# Patient Record
Sex: Male | Born: 1955
Health system: Southern US, Community
[De-identification: ages and names within clinical notes are randomized; demographics above are authoritative.]

## PROBLEM LIST (undated history)

## (undated) DIAGNOSIS — N529 Male erectile dysfunction, unspecified: Secondary | ICD-10-CM

## (undated) DIAGNOSIS — F988 Other specified behavioral and emotional disorders with onset usually occurring in childhood and adolescence: Secondary | ICD-10-CM

## (undated) DIAGNOSIS — Z5189 Encounter for other specified aftercare: Secondary | ICD-10-CM

## (undated) DIAGNOSIS — F341 Dysthymic disorder: Secondary | ICD-10-CM

## (undated) DIAGNOSIS — F528 Other sexual dysfunction not due to a substance or known physiological condition: Secondary | ICD-10-CM

## (undated) DIAGNOSIS — R109 Unspecified abdominal pain: Secondary | ICD-10-CM

## (undated) DIAGNOSIS — K222 Esophageal obstruction: Secondary | ICD-10-CM

## (undated) DIAGNOSIS — I10 Essential (primary) hypertension: Secondary | ICD-10-CM

## (undated) DIAGNOSIS — E119 Type 2 diabetes mellitus without complications: Secondary | ICD-10-CM

## (undated) DIAGNOSIS — R1032 Left lower quadrant pain: Secondary | ICD-10-CM

## (undated) DIAGNOSIS — R0602 Shortness of breath: Secondary | ICD-10-CM

## (undated) DIAGNOSIS — G609 Hereditary and idiopathic neuropathy, unspecified: Secondary | ICD-10-CM

## (undated) DIAGNOSIS — K449 Diaphragmatic hernia without obstruction or gangrene: Secondary | ICD-10-CM

## (undated) DIAGNOSIS — H918X9 Other specified hearing loss, unspecified ear: Secondary | ICD-10-CM

## (undated) DIAGNOSIS — M1990 Unspecified osteoarthritis, unspecified site: Secondary | ICD-10-CM

## (undated) DIAGNOSIS — M79609 Pain in unspecified limb: Secondary | ICD-10-CM

## (undated) DIAGNOSIS — G4730 Sleep apnea, unspecified: Secondary | ICD-10-CM

## (undated) DIAGNOSIS — R609 Edema, unspecified: Secondary | ICD-10-CM

## (undated) DIAGNOSIS — E785 Hyperlipidemia, unspecified: Secondary | ICD-10-CM

## (undated) DIAGNOSIS — D126 Benign neoplasm of colon, unspecified: Secondary | ICD-10-CM

## (undated) DIAGNOSIS — G610 Guillain-Barre syndrome: Secondary | ICD-10-CM

## (undated) DIAGNOSIS — K219 Gastro-esophageal reflux disease without esophagitis: Secondary | ICD-10-CM

## (undated) DIAGNOSIS — K649 Unspecified hemorrhoids: Secondary | ICD-10-CM

## (undated) DIAGNOSIS — J309 Allergic rhinitis, unspecified: Secondary | ICD-10-CM

## (undated) DIAGNOSIS — T7840XA Allergy, unspecified, initial encounter: Secondary | ICD-10-CM

## (undated) DIAGNOSIS — H269 Unspecified cataract: Secondary | ICD-10-CM

## (undated) DIAGNOSIS — E1165 Type 2 diabetes mellitus with hyperglycemia: Secondary | ICD-10-CM

## (undated) DIAGNOSIS — M25539 Pain in unspecified wrist: Secondary | ICD-10-CM

## (undated) DIAGNOSIS — G4733 Obstructive sleep apnea (adult) (pediatric): Secondary | ICD-10-CM

## (undated) DIAGNOSIS — G5760 Lesion of plantar nerve, unspecified lower limb: Secondary | ICD-10-CM

## (undated) HISTORY — DX: Essential (primary) hypertension: I10

## (undated) HISTORY — PX: CARPAL TUNNEL RELEASE: SHX101

## (undated) HISTORY — DX: Hereditary and idiopathic neuropathy, unspecified: G60.9

## (undated) HISTORY — DX: Allergic rhinitis, unspecified: J30.9

## (undated) HISTORY — PX: DENTAL SURGERY: SHX609

## (undated) HISTORY — DX: Hyperlipidemia, unspecified: E78.5

## (undated) HISTORY — PX: POLYPECTOMY: SHX149

## (undated) HISTORY — DX: Esophageal obstruction: K22.2

## (undated) HISTORY — DX: Left lower quadrant pain: R10.32

## (undated) HISTORY — DX: Encounter for other specified aftercare: Z51.89

## (undated) HISTORY — PX: UPPER GASTROINTESTINAL ENDOSCOPY: SHX188

## (undated) HISTORY — DX: Unspecified abdominal pain: R10.9

## (undated) HISTORY — DX: Dysthymic disorder: F34.1

## (undated) HISTORY — DX: Lesion of plantar nerve, unspecified lower limb: G57.60

## (undated) HISTORY — DX: Type 2 diabetes mellitus without complications: E11.9

## (undated) HISTORY — PX: EYE SURGERY: SHX253

## (undated) HISTORY — DX: Other specified hearing loss, unspecified ear: H91.8X9

## (undated) HISTORY — DX: Edema, unspecified: R60.9

## (undated) HISTORY — DX: Male erectile dysfunction, unspecified: N52.9

## (undated) HISTORY — DX: Shortness of breath: R06.02

## (undated) HISTORY — DX: Benign neoplasm of colon, unspecified: D12.6

## (undated) HISTORY — DX: Other sexual dysfunction not due to a substance or known physiological condition: F52.8

## (undated) HISTORY — DX: Obstructive sleep apnea (adult) (pediatric): G47.33

## (undated) HISTORY — DX: Sleep apnea, unspecified: G47.30

## (undated) HISTORY — DX: Allergy, unspecified, initial encounter: T78.40XA

## (undated) HISTORY — PX: ROTATOR CUFF REPAIR: SHX139

## (undated) HISTORY — DX: Unspecified osteoarthritis, unspecified site: M19.90

## (undated) HISTORY — DX: Unspecified hemorrhoids: K64.9

## (undated) HISTORY — DX: Other specified behavioral and emotional disorders with onset usually occurring in childhood and adolescence: F98.8

## (undated) HISTORY — DX: Unspecified cataract: H26.9

## (undated) HISTORY — PX: COLONOSCOPY: SHX174

## (undated) HISTORY — DX: Pain in unspecified wrist: M25.539

## (undated) HISTORY — DX: Gastro-esophageal reflux disease without esophagitis: K21.9

## (undated) HISTORY — DX: Type 2 diabetes mellitus with hyperglycemia: E11.65

## (undated) HISTORY — DX: Diaphragmatic hernia without obstruction or gangrene: K44.9

## (undated) HISTORY — DX: Pain in unspecified limb: M79.609

---

## 2000-06-18 ENCOUNTER — Other Ambulatory Visit: Admission: RE | Admit: 2000-06-18 | Discharge: 2000-06-18 | Payer: Self-pay | Admitting: Gastroenterology

## 2000-06-18 ENCOUNTER — Encounter (INDEPENDENT_AMBULATORY_CARE_PROVIDER_SITE_OTHER): Payer: Self-pay | Admitting: Specialist

## 2000-12-30 ENCOUNTER — Ambulatory Visit (HOSPITAL_BASED_OUTPATIENT_CLINIC_OR_DEPARTMENT_OTHER): Admission: RE | Admit: 2000-12-30 | Discharge: 2000-12-30 | Payer: Self-pay | Admitting: Internal Medicine

## 2002-10-13 ENCOUNTER — Ambulatory Visit (HOSPITAL_BASED_OUTPATIENT_CLINIC_OR_DEPARTMENT_OTHER): Admission: RE | Admit: 2002-10-13 | Discharge: 2002-10-13 | Payer: Self-pay | Admitting: Orthopedic Surgery

## 2002-11-03 ENCOUNTER — Ambulatory Visit (HOSPITAL_BASED_OUTPATIENT_CLINIC_OR_DEPARTMENT_OTHER): Admission: RE | Admit: 2002-11-03 | Discharge: 2002-11-03 | Payer: Self-pay | Admitting: Orthopedic Surgery

## 2003-08-30 ENCOUNTER — Ambulatory Visit (HOSPITAL_BASED_OUTPATIENT_CLINIC_OR_DEPARTMENT_OTHER): Admission: RE | Admit: 2003-08-30 | Discharge: 2003-08-30 | Payer: Self-pay | Admitting: Orthopedic Surgery

## 2003-10-16 ENCOUNTER — Ambulatory Visit (HOSPITAL_BASED_OUTPATIENT_CLINIC_OR_DEPARTMENT_OTHER): Admission: RE | Admit: 2003-10-16 | Discharge: 2003-10-16 | Payer: Self-pay | Admitting: Orthopedic Surgery

## 2005-04-02 ENCOUNTER — Ambulatory Visit: Payer: Self-pay | Admitting: Gastroenterology

## 2005-07-17 ENCOUNTER — Emergency Department (HOSPITAL_COMMUNITY): Admission: EM | Admit: 2005-07-17 | Discharge: 2005-07-18 | Payer: Self-pay | Admitting: Emergency Medicine

## 2006-04-20 ENCOUNTER — Ambulatory Visit: Payer: Self-pay | Admitting: Gastroenterology

## 2006-04-23 ENCOUNTER — Ambulatory Visit (HOSPITAL_COMMUNITY): Admission: RE | Admit: 2006-04-23 | Discharge: 2006-04-23 | Payer: Self-pay | Admitting: Gastroenterology

## 2006-05-07 ENCOUNTER — Ambulatory Visit: Payer: Self-pay | Admitting: Gastroenterology

## 2006-09-24 ENCOUNTER — Ambulatory Visit: Payer: Self-pay | Admitting: Internal Medicine

## 2006-10-14 ENCOUNTER — Ambulatory Visit: Payer: Self-pay | Admitting: Internal Medicine

## 2006-10-21 ENCOUNTER — Ambulatory Visit: Payer: Self-pay | Admitting: Internal Medicine

## 2008-02-01 DIAGNOSIS — F341 Dysthymic disorder: Secondary | ICD-10-CM

## 2008-02-01 DIAGNOSIS — K649 Unspecified hemorrhoids: Secondary | ICD-10-CM

## 2008-02-01 DIAGNOSIS — K219 Gastro-esophageal reflux disease without esophagitis: Secondary | ICD-10-CM

## 2008-02-01 DIAGNOSIS — D126 Benign neoplasm of colon, unspecified: Secondary | ICD-10-CM

## 2008-02-01 DIAGNOSIS — G4733 Obstructive sleep apnea (adult) (pediatric): Secondary | ICD-10-CM

## 2008-02-01 DIAGNOSIS — K449 Diaphragmatic hernia without obstruction or gangrene: Secondary | ICD-10-CM | POA: Insufficient documentation

## 2008-02-01 HISTORY — DX: Diaphragmatic hernia without obstruction or gangrene: K44.9

## 2008-02-01 HISTORY — DX: Dysthymic disorder: F34.1

## 2008-02-01 HISTORY — DX: Gastro-esophageal reflux disease without esophagitis: K21.9

## 2008-02-01 HISTORY — DX: Benign neoplasm of colon, unspecified: D12.6

## 2008-02-01 HISTORY — DX: Unspecified hemorrhoids: K64.9

## 2008-02-01 HISTORY — DX: Obstructive sleep apnea (adult) (pediatric): G47.33

## 2008-02-02 ENCOUNTER — Ambulatory Visit: Payer: Self-pay | Admitting: Gastroenterology

## 2008-02-02 DIAGNOSIS — K222 Esophageal obstruction: Secondary | ICD-10-CM

## 2008-02-02 HISTORY — DX: Esophageal obstruction: K22.2

## 2008-02-03 ENCOUNTER — Telehealth (INDEPENDENT_AMBULATORY_CARE_PROVIDER_SITE_OTHER): Payer: Self-pay | Admitting: *Deleted

## 2008-02-09 HISTORY — PX: ESOPHAGEAL DILATION: SHX303

## 2008-02-14 ENCOUNTER — Ambulatory Visit: Payer: Self-pay | Admitting: Gastroenterology

## 2008-06-24 ENCOUNTER — Encounter: Payer: Self-pay | Admitting: Internal Medicine

## 2008-10-03 ENCOUNTER — Telehealth (INDEPENDENT_AMBULATORY_CARE_PROVIDER_SITE_OTHER): Payer: Self-pay | Admitting: *Deleted

## 2008-10-09 ENCOUNTER — Ambulatory Visit: Payer: Self-pay | Admitting: Internal Medicine

## 2008-10-09 DIAGNOSIS — J309 Allergic rhinitis, unspecified: Secondary | ICD-10-CM

## 2008-10-09 DIAGNOSIS — F528 Other sexual dysfunction not due to a substance or known physiological condition: Secondary | ICD-10-CM

## 2008-10-09 DIAGNOSIS — F988 Other specified behavioral and emotional disorders with onset usually occurring in childhood and adolescence: Secondary | ICD-10-CM

## 2008-10-09 DIAGNOSIS — I10 Essential (primary) hypertension: Secondary | ICD-10-CM

## 2008-10-09 DIAGNOSIS — E785 Hyperlipidemia, unspecified: Secondary | ICD-10-CM

## 2008-10-09 HISTORY — DX: Hyperlipidemia, unspecified: E78.5

## 2008-10-09 HISTORY — DX: Other specified behavioral and emotional disorders with onset usually occurring in childhood and adolescence: F98.8

## 2008-10-09 HISTORY — DX: Allergic rhinitis, unspecified: J30.9

## 2008-10-09 HISTORY — DX: Essential (primary) hypertension: I10

## 2008-10-09 HISTORY — DX: Other sexual dysfunction not due to a substance or known physiological condition: F52.8

## 2008-12-18 ENCOUNTER — Telehealth (INDEPENDENT_AMBULATORY_CARE_PROVIDER_SITE_OTHER): Payer: Self-pay | Admitting: *Deleted

## 2009-01-15 ENCOUNTER — Ambulatory Visit: Payer: Self-pay | Admitting: Gastroenterology

## 2009-01-19 ENCOUNTER — Ambulatory Visit: Payer: Self-pay | Admitting: Endocrinology

## 2009-01-19 ENCOUNTER — Encounter: Payer: Self-pay | Admitting: Internal Medicine

## 2009-01-19 DIAGNOSIS — R109 Unspecified abdominal pain: Secondary | ICD-10-CM

## 2009-01-19 HISTORY — DX: Unspecified abdominal pain: R10.9

## 2009-01-19 LAB — CONVERTED CEMR LAB
Amylase: 41 units/L (ref 27–131)
BUN: 14 mg/dL (ref 6–23)
Basophils Absolute: 0 10*3/uL (ref 0.0–0.1)
GFR calc non Af Amer: 106.5 mL/min (ref >60)
Glucose, Bld: 109 mg/dL — ABNORMAL HIGH (ref 70–99)
HCT: 48 % (ref 39.0–52.0)
Leukocytes, UA: NEGATIVE
Lymphs Abs: 2.9 10*3/uL (ref 0.7–4.0)
MCV: 92.6 fL (ref 78.0–100.0)
Monocytes Absolute: 0.6 10*3/uL (ref 0.1–1.0)
Monocytes Relative: 7.6 % (ref 3.0–12.0)
Nitrite: NEGATIVE
Platelets: 287 10*3/uL (ref 150.0–400.0)
Potassium: 5 meq/L (ref 3.5–5.1)
RDW: 12.7 % (ref 11.5–14.6)
Specific Gravity, Urine: >=1.03 (ref 1.000–1.030)
Total Bilirubin: 1 mg/dL (ref 0.3–1.2)
pH: 5.5 (ref 5.0–8.0)

## 2009-01-29 ENCOUNTER — Ambulatory Visit: Payer: Self-pay | Admitting: Gastroenterology

## 2009-01-29 ENCOUNTER — Encounter: Payer: Self-pay | Admitting: Gastroenterology

## 2009-01-30 ENCOUNTER — Telehealth (INDEPENDENT_AMBULATORY_CARE_PROVIDER_SITE_OTHER): Payer: Self-pay | Admitting: *Deleted

## 2009-01-31 ENCOUNTER — Encounter: Payer: Self-pay | Admitting: Gastroenterology

## 2009-02-26 ENCOUNTER — Telehealth (INDEPENDENT_AMBULATORY_CARE_PROVIDER_SITE_OTHER): Payer: Self-pay | Admitting: *Deleted

## 2009-03-28 ENCOUNTER — Telehealth: Payer: Self-pay | Admitting: Internal Medicine

## 2009-04-04 ENCOUNTER — Telehealth: Payer: Self-pay | Admitting: Internal Medicine

## 2009-04-23 ENCOUNTER — Telehealth: Payer: Self-pay | Admitting: Internal Medicine

## 2009-05-30 ENCOUNTER — Telehealth: Payer: Self-pay | Admitting: Internal Medicine

## 2009-06-27 ENCOUNTER — Ambulatory Visit: Payer: Self-pay | Admitting: Internal Medicine

## 2009-06-27 DIAGNOSIS — H918X9 Other specified hearing loss, unspecified ear: Secondary | ICD-10-CM | POA: Insufficient documentation

## 2009-06-27 HISTORY — DX: Other specified hearing loss, unspecified ear: H91.8X9

## 2009-06-28 LAB — CONVERTED CEMR LAB
ALT: 34 units/L (ref 0–53)
AST: 29 units/L (ref 0–37)
Basophils Relative: 0.8 % (ref 0.0–3.0)
Bilirubin, Direct: 0.1 mg/dL (ref 0.0–0.3)
Chloride: 104 meq/L (ref 96–112)
Creatinine, Ser: 0.7 mg/dL (ref 0.4–1.5)
Eosinophils Relative: 2.1 % (ref 0.0–5.0)
HCT: 42.2 % (ref 39.0–52.0)
Hemoglobin, Urine: NEGATIVE
Leukocytes, UA: NEGATIVE
MCV: 93.2 fL (ref 78.0–100.0)
Monocytes Absolute: 0.5 10*3/uL (ref 0.1–1.0)
Monocytes Relative: 6.4 % (ref 3.0–12.0)
Neutrophils Relative %: 57.4 % (ref 43.0–77.0)
Nitrite: NEGATIVE
PSA: 0.3 ng/mL (ref 0.10–4.00)
Potassium: 3.6 meq/L (ref 3.5–5.1)
RBC: 4.53 M/uL (ref 4.22–5.81)
Specific Gravity, Urine: 1.025 (ref 1.000–1.030)
TSH: 1.24 microintl units/mL (ref 0.35–5.50)
Total CHOL/HDL Ratio: 3
Total Protein: 6.6 g/dL (ref 6.0–8.3)
Triglycerides: 121 mg/dL (ref 0.0–149.0)
Urine Glucose: NEGATIVE mg/dL
Urobilinogen, UA: 0.2 (ref 0.0–1.0)
WBC: 7.4 10*3/uL (ref 4.5–10.5)

## 2009-07-09 ENCOUNTER — Telehealth: Payer: Self-pay | Admitting: Internal Medicine

## 2009-07-12 ENCOUNTER — Telehealth: Payer: Self-pay | Admitting: Internal Medicine

## 2009-07-13 ENCOUNTER — Encounter: Payer: Self-pay | Admitting: Internal Medicine

## 2009-08-13 ENCOUNTER — Telehealth: Payer: Self-pay | Admitting: Internal Medicine

## 2009-08-22 ENCOUNTER — Ambulatory Visit: Payer: Self-pay | Admitting: Internal Medicine

## 2009-08-22 ENCOUNTER — Encounter (INDEPENDENT_AMBULATORY_CARE_PROVIDER_SITE_OTHER): Payer: Self-pay | Admitting: *Deleted

## 2009-09-17 ENCOUNTER — Telehealth: Payer: Self-pay | Admitting: Internal Medicine

## 2009-10-11 ENCOUNTER — Telehealth: Payer: Self-pay | Admitting: Internal Medicine

## 2009-10-18 ENCOUNTER — Encounter: Payer: Self-pay | Admitting: Internal Medicine

## 2009-11-12 ENCOUNTER — Telehealth: Payer: Self-pay | Admitting: Internal Medicine

## 2009-12-05 ENCOUNTER — Encounter (INDEPENDENT_AMBULATORY_CARE_PROVIDER_SITE_OTHER): Payer: Self-pay | Admitting: *Deleted

## 2009-12-05 ENCOUNTER — Ambulatory Visit: Payer: Self-pay | Admitting: Internal Medicine

## 2009-12-05 DIAGNOSIS — M25539 Pain in unspecified wrist: Secondary | ICD-10-CM

## 2009-12-05 HISTORY — DX: Pain in unspecified wrist: M25.539

## 2009-12-11 ENCOUNTER — Telehealth: Payer: Self-pay | Admitting: Internal Medicine

## 2010-01-14 ENCOUNTER — Telehealth: Payer: Self-pay | Admitting: Internal Medicine

## 2010-02-12 ENCOUNTER — Telehealth: Payer: Self-pay | Admitting: Internal Medicine

## 2010-03-12 ENCOUNTER — Ambulatory Visit: Payer: Self-pay | Admitting: Internal Medicine

## 2010-03-12 ENCOUNTER — Encounter: Payer: Self-pay | Admitting: Internal Medicine

## 2010-03-12 DIAGNOSIS — R0602 Shortness of breath: Secondary | ICD-10-CM

## 2010-03-12 HISTORY — DX: Shortness of breath: R06.02

## 2010-04-09 ENCOUNTER — Telehealth: Payer: Self-pay | Admitting: Internal Medicine

## 2010-05-14 ENCOUNTER — Ambulatory Visit: Payer: Self-pay | Admitting: Internal Medicine

## 2010-05-14 LAB — CONVERTED CEMR LAB
AST: 25 units/L (ref 0–37)
Albumin: 4.1 g/dL (ref 3.5–5.2)
Alkaline Phosphatase: 78 units/L (ref 39–117)
Basophils Absolute: 0 10*3/uL (ref 0.0–0.1)
Bilirubin Urine: NEGATIVE
Bilirubin, Direct: 0.2 mg/dL (ref 0.0–0.3)
CO2: 29 meq/L (ref 19–32)
Eosinophils Relative: 2.4 % (ref 0.0–5.0)
GFR calc non Af Amer: 110.77 mL/min (ref >60)
Glucose, Bld: 126 mg/dL — ABNORMAL HIGH (ref 70–99)
HDL: 53.7 mg/dL (ref >39.00)
Ketones, ur: NEGATIVE mg/dL
LDL Cholesterol: 82 mg/dL (ref 0–99)
Leukocytes, UA: NEGATIVE
Lymphocytes Relative: 31.3 % (ref 12.0–46.0)
Monocytes Relative: 6.7 % (ref 3.0–12.0)
Neutrophils Relative %: 59.1 % (ref 43.0–77.0)
Nitrite: NEGATIVE
Platelets: 250 10*3/uL (ref 150.0–400.0)
Potassium: 4 meq/L (ref 3.5–5.1)
RDW: 13.3 % (ref 11.5–14.6)
Sodium: 141 meq/L (ref 135–145)
Total CHOL/HDL Ratio: 3
Total Protein: 7.1 g/dL (ref 6.0–8.3)
Triglycerides: 96 mg/dL (ref 0.0–149.0)
Urobilinogen, UA: 0.2 (ref 0.0–1.0)
WBC: 7.7 10*3/uL (ref 4.5–10.5)
pH: 7.5 (ref 5.0–8.0)

## 2010-05-20 ENCOUNTER — Ambulatory Visit: Payer: Self-pay | Admitting: Internal Medicine

## 2010-05-20 DIAGNOSIS — E1165 Type 2 diabetes mellitus with hyperglycemia: Secondary | ICD-10-CM

## 2010-05-20 DIAGNOSIS — G609 Hereditary and idiopathic neuropathy, unspecified: Secondary | ICD-10-CM

## 2010-05-20 DIAGNOSIS — N529 Male erectile dysfunction, unspecified: Secondary | ICD-10-CM

## 2010-05-20 DIAGNOSIS — R609 Edema, unspecified: Secondary | ICD-10-CM

## 2010-05-20 DIAGNOSIS — G5760 Lesion of plantar nerve, unspecified lower limb: Secondary | ICD-10-CM

## 2010-05-20 DIAGNOSIS — M79609 Pain in unspecified limb: Secondary | ICD-10-CM | POA: Insufficient documentation

## 2010-05-20 DIAGNOSIS — E1142 Type 2 diabetes mellitus with diabetic polyneuropathy: Secondary | ICD-10-CM

## 2010-05-20 DIAGNOSIS — E119 Type 2 diabetes mellitus without complications: Secondary | ICD-10-CM

## 2010-05-20 DIAGNOSIS — E0840 Diabetes mellitus due to underlying condition with diabetic neuropathy, unspecified: Secondary | ICD-10-CM | POA: Insufficient documentation

## 2010-05-20 HISTORY — DX: Pain in unspecified limb: M79.609

## 2010-05-20 HISTORY — DX: Type 2 diabetes mellitus without complications: E11.9

## 2010-05-20 HISTORY — DX: Lesion of plantar nerve, unspecified lower limb: G57.60

## 2010-05-20 HISTORY — DX: Edema, unspecified: R60.9

## 2010-05-20 HISTORY — DX: Hereditary and idiopathic neuropathy, unspecified: G60.9

## 2010-05-20 HISTORY — DX: Male erectile dysfunction, unspecified: N52.9

## 2010-06-06 ENCOUNTER — Ambulatory Visit: Payer: Self-pay | Admitting: Internal Medicine

## 2010-06-06 ENCOUNTER — Encounter (INDEPENDENT_AMBULATORY_CARE_PROVIDER_SITE_OTHER): Payer: Self-pay | Admitting: *Deleted

## 2010-06-06 DIAGNOSIS — R1032 Left lower quadrant pain: Secondary | ICD-10-CM | POA: Insufficient documentation

## 2010-06-06 HISTORY — DX: Left lower quadrant pain: R10.32

## 2010-06-06 LAB — CONVERTED CEMR LAB
BUN: 12 mg/dL (ref 6–23)
Basophils Absolute: 0 10*3/uL (ref 0.0–0.1)
CO2: 30 meq/L (ref 19–32)
Calcium: 9.3 mg/dL (ref 8.4–10.5)
Creatinine, Ser: 0.8 mg/dL (ref 0.4–1.5)
Eosinophils Absolute: 0 10*3/uL (ref 0.0–0.7)
GFR calc non Af Amer: 102.99 mL/min (ref >60)
Glucose, Bld: 104 mg/dL — ABNORMAL HIGH (ref 70–99)
Hemoglobin, Urine: NEGATIVE
Leukocytes, UA: NEGATIVE
Lipase: 19 units/L (ref 11.0–59.0)
Lymphocytes Relative: 33.8 % (ref 12.0–46.0)
MCHC: 34.5 g/dL (ref 30.0–36.0)
Monocytes Relative: 6.2 % (ref 3.0–12.0)
Neutro Abs: 4.6 10*3/uL (ref 1.4–7.7)
Neutrophils Relative %: 59.3 % (ref 43.0–77.0)
Platelets: 246 10*3/uL (ref 150.0–400.0)
RDW: 13.2 % (ref 11.5–14.6)
Sodium: 134 meq/L — ABNORMAL LOW (ref 135–145)
Specific Gravity, Urine: 1.01 (ref 1.000–1.030)
Urine Glucose: NEGATIVE mg/dL
Urobilinogen, UA: 0.2 (ref 0.0–1.0)

## 2010-06-07 ENCOUNTER — Encounter: Payer: Self-pay | Admitting: Internal Medicine

## 2010-06-20 ENCOUNTER — Encounter
Admission: RE | Admit: 2010-06-20 | Discharge: 2010-06-20 | Payer: Self-pay | Source: Home / Self Care | Attending: Physical Medicine and Rehabilitation | Admitting: Physical Medicine and Rehabilitation

## 2010-06-25 ENCOUNTER — Encounter: Payer: Self-pay | Admitting: Internal Medicine

## 2010-07-16 ENCOUNTER — Ambulatory Visit: Payer: Self-pay | Admitting: Internal Medicine

## 2010-07-17 ENCOUNTER — Encounter: Payer: Self-pay | Admitting: Internal Medicine

## 2010-07-17 ENCOUNTER — Telehealth (INDEPENDENT_AMBULATORY_CARE_PROVIDER_SITE_OTHER): Payer: Self-pay | Admitting: *Deleted

## 2010-09-01 ENCOUNTER — Encounter: Payer: Self-pay | Admitting: Gastroenterology

## 2010-09-12 NOTE — Letter (Signed)
Summary: Out of Work  [facility]  [address] [place], [place] [address]   Phone: [phone]  Fax: [phone]    June 06, 2010   Employee:  CHEYNE BOULDEN    To Whom It May Concern:   For Medical reasons, please excuse the above named employee from work for the following dates:  Start:   06/04/2010  End:   06/10/2010  Return to work Monday 06/10/2010  If you need additional information, please feel free to contact our office.         Sincerely,    Dr. Oliver Barre

## 2010-09-12 NOTE — Assessment & Plan Note (Signed)
Summary: NAUSEA/ NWS   Vital Signs:  Patient profile:   55 year old male Height:      69 inches Weight:      209 pounds BMI:     30.98 O2 Sat:      94 % on Room air Temp:     100.6 degrees F oral Pulse rate:   115 / minute BP sitting:   138 / 78  (left arm) Cuff size:   large  Vitals Entered By: [name] Ewing CMA (AAMA) (June 06, 2010 3:20 PM)  O2 Flow:  Room air CC: Nausea, stomach cramps, Headaches, dizziness for 3 days/RE   Primary Care Provider:  Corwin Levins MD  CC:  Nausea, stomach cramps, Headaches, and dizziness for 3 days/RE.  History of Present Illness: here with acute vist, c/o 2-3 days onset gradually worsening pain to LLQ, now moderate to severe, with some radiation to the left back,and looser stool without vomit, blood., but with fever (no chills), and no GU symtpoms of urinary freq, urgency, blood.   Has mild dizziness, nausea, headache, general weakness and malaise.  Pt denies CP, worsening sob, doe, wheezing, orthopnea, pnd, worsening LE edema, palps, dizziness or syncope  Pt denies new neuro symptoms such as headache, facial or extremity weakness Pt denies polydipsia, polyuria.    Problems Prior to Update: 1)  Abdominal Pain, Left Lower Quadrant  (ICD-789.04) 2)  Peripheral Neuropathy  (ICD-356.9) 3)  Peripheral Edema  (ICD-782.3) 4)  Erectile Dysfunction, Organic  (ICD-607.84) 5)  Foot Pain, Left  (ICD-729.5) 6)  Morton's Neuroma  (ICD-355.6) 7)  Diabetes Mellitus, Type II  (ICD-250.00) 8)  Dyspnea  (ICD-786.05) 9)  Wrist Pain, Left  (ICD-719.43) 10)  Other Specified Forms of Hearing Loss  (ICD-389.8) 11)  Preventive Health Care  (ICD-V70.0) 12)  Abdominal Pain, Unspecified Site  (ICD-789.00) 13)  Add  (ICD-314.00) 14)  Erectile Dysfunction  (ICD-302.72) 15)  Allergic Rhinitis  (ICD-477.9) 16)  Hyperlipidemia  (ICD-272.4) 17)  Hypertension  (ICD-401.9) 18)  Stricture and Stenosis of Esophagus  (ICD-530.3) 19)  Hiatal Hernia  (ICD-553.3) 20)   Hemorrhoids  (ICD-455.6) 21)  Colonic Polyps  (ICD-211.3) 22)  Sleep Apnea, Obstructive  (ICD-327.23) 23)  Anxiety Depression  (ICD-300.4) 24)  Gerd  (ICD-530.81)  Medications Prior to Update: 1)  Lyrica 100 Mg  Caps (Pregabalin) .... Three Times A Day 2)  Cymbalta 30 Mg  Cpep (Duloxetine Hcl) .... Three Times A Day 3)  Concerta 54 Mg Cr-Tabs (Methylphenidate Hcl) .... Generic - 1 By Mouth Bid  - To Fill Sep 11, 2010 4)  Gabapentin 600 Mg  Tabs (Gabapentin) .... Once Daily 5)  Imipramine Hcl 25 Mg  Tabs (Imipramine Hcl) .... As Needed 6)  Prilosec 20 Mg  Cpdr (Omeprazole) .... Once Daily 7)  Lipitor 10 Mg Tabs (Atorvastatin Calcium) .Marland Kitchen.. 1 By Mouth Once Daily 8)  Tramadol Hcl 50 Mg Tabs (Tramadol Hcl) .Marland Kitchen.. 1 Tab Four Times A Day As Needed 9)  Amlodipine Besylate 5 Mg Tabs (Amlodipine Besylate) .Marland Kitchen.. 1 By Mouth Once Daily 10)  Adult Aspirin Ec Low Strength 81 Mg Tbec (Aspirin) .Marland Kitchen.. 1 By Mouth Once Daily 11)  Zofran Odt 4 Mg Tbdp (Ondansetron) .Marland Kitchen.. 1 Q4h As Needed Nausea 12)  Levitra 20 Mg Tabs (Vardenafil Hcl) .Marland Kitchen.. 1po Once Daily As Needed 13)  Metanx 3-35-2 Mg Tabs (L-Methylfolate-B6-B12) .Marland Kitchen.. 1 By Mouth Two Times A Day 14)  Clonazepam 0.5 Mg Tabs (Clonazepam) .Marland Kitchen.. 1po Two Times A Day As Needed  15)  Metformin Hcl 500 Mg Xr24h-Tab (Metformin Hcl) .Marland Kitchen.. 1po Once Daily 16)  Hydrochlorothiazide 12.5 Mg Caps (Hydrochlorothiazide) .Marland Kitchen.. 1 By Mouth Once Daily As Needed Swelling Only  Current Medications (verified): 1)  Lyrica 100 Mg  Caps (Pregabalin) .... Three Times A Day 2)  Cymbalta 30 Mg  Cpep (Duloxetine Hcl) .... Three Times A Day 3)  Concerta 54 Mg Cr-Tabs (Methylphenidate Hcl) .... Generic - 1 By Mouth Bid  - To Fill Sep 11, 2010 4)  Gabapentin 600 Mg  Tabs (Gabapentin) .... Once Daily 5)  Imipramine Hcl 25 Mg  Tabs (Imipramine Hcl) .... As Needed 6)  Prilosec 20 Mg  Cpdr (Omeprazole) .... Once Daily 7)  Lipitor 10 Mg Tabs (Atorvastatin Calcium) .Marland Kitchen.. 1 By Mouth Once Daily 8)  Tramadol  Hcl 50 Mg Tabs (Tramadol Hcl) .Marland Kitchen.. 1 Tab Four Times A Day As Needed 9)  Amlodipine Besylate 5 Mg Tabs (Amlodipine Besylate) .Marland Kitchen.. 1 By Mouth Once Daily 10)  Adult Aspirin Ec Low Strength 81 Mg Tbec (Aspirin) .Marland Kitchen.. 1 By Mouth Once Daily 11)  Zofran Odt 4 Mg Tbdp (Ondansetron) .Marland Kitchen.. 1 Q4h As Needed Nausea 12)  Levitra 20 Mg Tabs (Vardenafil Hcl) .Marland Kitchen.. 1po Once Daily As Needed 13)  Metanx 3-35-2 Mg Tabs (L-Methylfolate-B6-B12) .Marland Kitchen.. 1 By Mouth Two Times A Day 14)  Clonazepam 0.5 Mg Tabs (Clonazepam) .Marland Kitchen.. 1po Two Times A Day As Needed 15)  Metformin Hcl 500 Mg Xr24h-Tab (Metformin Hcl) .Marland Kitchen.. 1po Once Daily 16)  Hydrochlorothiazide 12.5 Mg Caps (Hydrochlorothiazide) .Marland Kitchen.. 1 By Mouth Once Daily As Needed Swelling Only 17)  Ciprofloxacin Hcl 500 Mg Tabs (Ciprofloxacin Hcl) .Marland Kitchen.. 1po Two Times A Day 18)  Metronidazole 250 Mg Tabs (Metronidazole) .Marland Kitchen.. 1po Four Times Per Day For 10 Days  Allergies (verified): 1)  ! Codeine  Past History:  Past Medical History: Last updated: 05/20/2010 HIATAL HERNIA (ICD-553.3) HEMORRHOIDS (ICD-455.6) COLONIC POLYPS (ICD-211.3) SLEEP APNEA, OBSTRUCTIVE (ICD-327.23) - mild ANXIETY DEPRESSION (ICD-300.4) GERD (ICD-530.81) Hypertension Hyperlipidemia Allergic rhinitis E.D.  Diabetes mellitus, type II left foot morton's neuroma  Past Surgical History: Last updated: 10/09/2008 Carpal Tunnel Release Rotator Cuff Repair esoph stricture dilation july 2009  Social History: Last updated: 10/09/2008 Occupation:Housekeeper @ [employer]  Patient has never smoked.  Alcohol Use - yes - moderately Daily Caffeine Use Illicit Drug Use - no Patient does not get regular exercise.  no children Single  Risk Factors: Exercise: no (02/02/2008)  Risk Factors: Smoking Status: never (02/02/2008)  Review of Systems       all otherwise negative per pt -    Physical Exam  General:  alert and overweight-appearing.  , mild ill  Head:  normocephalic and atraumatic.   Eyes:   vision grossly intact, pupils equal, and pupils round.   Ears:  R ear normal and L ear normal.   Nose:  no external deformity and no nasal discharge.   Mouth:  no gingival abnormalities and pharynx pink and moist.   Neck:  supple and no masses.   Lungs:  normal respiratory effort and normal breath sounds.   Heart:  normal rate and regular rhythm.   Abdomen:  soft and normal bowel sounds.  but moderate LLQ tender without guarding or rebound Extremities:  no edema, no erythema    Impression & Recommendations:  Problem # 1:  ABDOMINAL PAIN, LEFT LOWER QUADRANT (ICD-789.04) Diff includes diverticultis, colitis ,UTI vs other;  for labs and urine studies, empiric cipro/flagyl, pain control and nause med; f/u any worsening s/s, consider  CT or GI eval Orders: T-Culture, Urine (84132-44010) TLB-BMP (Basic Metabolic Panel-BMET) (80048-METABOL) TLB-CBC Platelet - w/Differential (85025-CBCD) TLB-Lipase (83690-LIPASE) TLB-Udip w/ Micro (81001-URINE)  Problem # 2:  DIABETES MELLITUS, TYPE II (ICD-250.00)  His updated medication list for this problem includes:    Adult Aspirin Ec Low Strength 81 Mg Tbec (Aspirin) .Marland Kitchen... 1 by mouth once daily    Metformin Hcl 500 Mg Xr24h-tab (Metformin hcl) .Marland Kitchen... 1po once daily  Labs Reviewed: Creat: 0.8 (05/14/2010)    Reviewed HgBA1c results: 6.6 (05/14/2010) stable overall by hx and exam, ok to continue meds/tx as is , pt to cont monitor cbg's at home, call for cbg's > 200 or onset polys  Problem # 3:  HYPERTENSION (ICD-401.9)  His updated medication list for this problem includes:    Amlodipine Besylate 5 Mg Tabs (Amlodipine besylate) .Marland Kitchen... 1 by mouth once daily    Hydrochlorothiazide 12.5 Mg Caps (Hydrochlorothiazide) .Marland Kitchen... 1 by mouth once daily as needed swelling only  BP today: 138/78 Prior BP: 128/70 (05/20/2010)  Labs Reviewed: K+: 4.0 (05/14/2010) Creat: : 0.8 (05/14/2010)   Chol: 155 (05/14/2010)   HDL: 53.70 (05/14/2010)   LDL: 82 (05/14/2010)    TG: 96.0 (05/14/2010) stable overall by hx and exam, ok to continue meds/tx as is   Complete Medication List: 1)  Lyrica 100 Mg Caps (Pregabalin) .... Three times a day 2)  Cymbalta 30 Mg Cpep (Duloxetine hcl) .... Three times a day 3)  Concerta 54 Mg Cr-tabs (Methylphenidate hcl) .... Generic - 1 by mouth bid  - to fill Sep 11, 2010 4)  Gabapentin 600 Mg Tabs (Gabapentin) .... Once daily 5)  Imipramine Hcl 25 Mg Tabs (Imipramine hcl) .... As needed 6)  Prilosec 20 Mg Cpdr (Omeprazole) .... Once daily 7)  Lipitor 10 Mg Tabs (Atorvastatin calcium) .Marland Kitchen.. 1 by mouth once daily 8)  Tramadol Hcl 50 Mg Tabs (Tramadol hcl) .Marland Kitchen.. 1 tab four times a day as needed 9)  Amlodipine Besylate 5 Mg Tabs (Amlodipine besylate) .Marland Kitchen.. 1 by mouth once daily 10)  Adult Aspirin Ec Low Strength 81 Mg Tbec (Aspirin) .Marland Kitchen.. 1 by mouth once daily 11)  Zofran Odt 4 Mg Tbdp (Ondansetron) .Marland Kitchen.. 1 q4h as needed nausea 12)  Levitra 20 Mg Tabs (Vardenafil hcl) .Marland Kitchen.. 1po once daily as needed 13)  Metanx 3-35-2 Mg Tabs (L-methylfolate-b6-b12) .Marland Kitchen.. 1 by mouth two times a day 14)  Clonazepam 0.5 Mg Tabs (Clonazepam) .Marland Kitchen.. 1po two times a day as needed 15)  Metformin Hcl 500 Mg Xr24h-tab (Metformin hcl) .Marland Kitchen.. 1po once daily 16)  Hydrochlorothiazide 12.5 Mg Caps (Hydrochlorothiazide) .Marland Kitchen.. 1 by mouth once daily as needed swelling only 17)  Ciprofloxacin Hcl 500 Mg Tabs (Ciprofloxacin hcl) .Marland Kitchen.. 1po two times a day 18)  Metronidazole 250 Mg Tabs (Metronidazole) .Marland Kitchen.. 1po four times per day for 10 days  Patient Instructions: 1)  Please take all new medications as prescribed 2)  Continue all previous medications as before this visit  3)  Please go to the Lab in the basement for your blood and/or urine tests today 4)  Please call the number on the [facility] Card for results of your testing  5)  Please call or return if the pain or fever is worse, or blood occurs for consideration for CT scan 6)  Please schedule a follow-up appointment as  needed. Prescriptions: OXYCODONE HCL 5 MG CAPS (OXYCODONE HCL) 1 - 2 by mouth q 6 hrs as needed pain  #60 x 0   Entered and Authorized  by:   Corwin Levins MD   Signed by:   Corwin Levins MD on 06/06/2010   Method used:   Print then Give to Patient   RxID:   [id number] ZOFRAN ODT 4 MG TBDP (ONDANSETRON) 1 q4h as needed nausea  #30 x 0   Entered and Authorized by:   Corwin Levins MD   Signed by:   Corwin Levins MD on 06/06/2010   Method used:   Print then Give to Patient   RxID:   [id number] HYDROCODONE-ACETAMINOPHEN 5-325 MG TABS (HYDROCODONE-ACETAMINOPHEN) 1po q 6 hrs as needed  #60 x 1   Entered and Authorized by:   Corwin Levins MD   Signed by:   Corwin Levins MD on 06/06/2010   Method used:   Print then Give to Patient   RxID:   [id number] METRONIDAZOLE 250 MG TABS (METRONIDAZOLE) 1po four times per day for 10 days  #40 x 0   Entered and Authorized by:   Corwin Levins MD   Signed by:   Corwin Levins MD on 06/06/2010   Method used:   Electronically to        [facility] [address]. [phone]* (retail)       [address] [place], [place]  [address]       Ph: [phone]       Fax: [phone]   RxID:   [id number] CIPROFLOXACIN HCL 500 MG TABS (CIPROFLOXACIN HCL) 1po two times a day  #20 x 0   Entered and Authorized by:   Corwin Levins MD   Signed by:   Corwin Levins MD on 06/06/2010   Method used:   Electronically to        [facility] [address]. [phone]* (retail)       [address] [place], [place]  [address]       Ph: [phone]       Fax: [phone]   RxID:   [id number]    Orders Added: 1)  T-Culture, Urine [[id number]] 2)  TLB-BMP (Basic Metabolic Panel-BMET) [80048-METABOL] 3)  TLB-CBC Platelet - w/Differential [85025-CBCD] 4)  TLB-Lipase [83690-LIPASE] 5)  TLB-Udip w/ Micro [81001-URINE] 6)  Est. Patient Level IV [[address]381]

## 2010-09-12 NOTE — Letter (Signed)
Summary: Out of Work  [facility]  [address] [place], [place] [address]   Phone: [phone]  Fax: [phone]    December 05, 2009   Employee:  Frank [name]    To Whom It May Concern:   For Medical reasons, please excuse the above named employee from work for the following dates:  Start:   12/06/2009  End:   12/07/2009  If you need additional information, please feel free to contact our office.         Sincerely,    Dr. Oliver Barre

## 2010-09-12 NOTE — Progress Notes (Signed)
Summary: Rx refill  Phone Note Call from Patient Call back at Home Phone [phone]   Caller: Patient Summary of Call: pt called requesting refill of Concerta Initial call taken by: Margaret Pyle, CMA,  Dec 11, 2009 3:23 PM  Follow-up for Phone Call        pt informed via VM, Rx in cabinet for pt pick up Follow-up by: Margaret Pyle, CMA,  Dec 11, 2009 3:53 PM    New/Updated Medications: CONCERTA 36 MG CR-TABS (METHYLPHENIDATE HCL) 1po two times a day - to fill Dec 13, 2009 Prescriptions: CONCERTA 36 MG CR-TABS (METHYLPHENIDATE HCL) 1po two times a day - to fill Dec 13, 2009  #60 x 0   Entered and Authorized by:   Corwin Levins MD   Signed by:   Corwin Levins MD on 12/11/2009   Method used:   Print then Give to Patient   RxID:   [id number]  done hardcopy to LIM side B - dahlia  Corwin Levins MD  Dec 11, 2009 3:51 PM

## 2010-09-12 NOTE — Progress Notes (Signed)
Summary: REFILL - Concerta  Phone Note Refill Request   Refills Requested: Medication #1:  CONCERTA 36 MG CR-TABS 1po two times a day - to fill nov 29 Initial call taken by: Lamar Sprinkles, CMA,  August 13, 2009 4:32 PM  Follow-up for Phone Call        done hardcopy to LIM side B - dahlia  Follow-up by: Corwin Levins MD,  August 14, 2009 1:11 PM  Additional Follow-up for Phone Call Additional follow up Details #1::        left message on machine for pt to return my call, rx in cabinet for pt pick Additional Follow-up by: Margaret Pyle, CMA,  August 14, 2009 2:00 PM    Additional Follow-up for Phone Call Additional follow up Details #2::    pt came in to pick up medication Follow-up by: Margaret Pyle, CMA,  August 14, 2009 4:39 PM  New/Updated Medications: CONCERTA 36 MG CR-TABS (METHYLPHENIDATE HCL) 1po two times a day - to fill Aug 14, 2009 Prescriptions: CONCERTA 36 MG CR-TABS (METHYLPHENIDATE HCL) 1po two times a day - to fill Aug 14, 2009  #60 x 0   Entered and Authorized by:   Corwin Levins MD   Signed by:   Corwin Levins MD on 08/14/2009   Method used:   Print then Give to Patient   RxID:   [id number]

## 2010-09-12 NOTE — Progress Notes (Signed)
Summary: PA-Concerta  Phone Note From Pharmacy   Reason for Call: Patient requests substitution Summary of Call: PA-Concerta , called [facility] @ [phone], awaiting form. Dagoberto Reef  July 17, 2010 2:43 PM  PA Faxed to [facility] @ [phone], awaiting approval. Dagoberto Reef  July 17, 2010 3:00 PM  PA approved 06/26/10-12-6/12, case # [id number], pt aware .  Initial call taken by: Dagoberto Reef,  July 18, 2010 8:04 AM

## 2010-09-12 NOTE — Progress Notes (Signed)
Summary: Concerta  Phone Note Call from Patient Call back at Home Phone [phone]   Caller: Patient Summary of Call: pt is requesting refill of Concerta 36mg   Initial call taken by: Margaret Pyle, CMA,  November 12, 2009 3:57 PM  Follow-up for Phone Call        pt informed via secure home VM. Rx in cabinet for pt pick up Follow-up by: Margaret Pyle, CMA,  November 13, 2009 8:00 AM    New/Updated Medications: CONCERTA 36 MG CR-TABS (METHYLPHENIDATE HCL) 1po two times a day - to fill Nov 13, 2009 Prescriptions: CONCERTA 36 MG CR-TABS (METHYLPHENIDATE HCL) 1po two times a day - to fill Nov 13, 2009  #60 x 0   Entered and Authorized by:   Corwin Levins MD   Signed by:   Corwin Levins MD on 11/12/2009   Method used:   Print then Give to Patient   RxID:   [id number]  done hardcopy to LIM side B - dahlia  Corwin Levins MD  November 12, 2009 5:39 PM

## 2010-09-12 NOTE — Miscellaneous (Signed)
Summary: Appt cancelled/[facility]  Appt cancelled/[facility]   Imported By: Sherian Rein 07/02/2010 07:44:02  _____________________________________________________________________  External Attachment:    Type:   Image     Comment:   External Document

## 2010-09-12 NOTE — Progress Notes (Signed)
Summary: Concerta refill  Phone Note Refill Request Message from:  Patient on September 17, 2009 4:01 PM  Refills Requested: Medication #1:  CONCERTA 36 MG CR-TABS 1po two times a day - to fill jan 4 Initial call taken by: Lucious Groves,  September 17, 2009 4:02 PM  Follow-up for Phone Call        done hardcopy to LIM side B - dahlia  Follow-up by: Corwin Levins MD,  September 17, 2009 5:19 PM  Additional Follow-up for Phone Call Additional follow up Details #1::        Faxed prescription. Left message on machine to call back to office. Additional Follow-up by: Lucious Groves,  September 18, 2009 8:13 AM    Additional Follow-up for Phone Call Additional follow up Details #2::    pt told via VM to check with pharmacy for refills of medication Follow-up by: Margaret Pyle, CMA,  September 18, 2009 9:28 AM  New/Updated Medications: CONCERTA 36 MG CR-TABS (METHYLPHENIDATE HCL) 1po two times a day - to fill Sep 17, 2009 Prescriptions: CONCERTA 36 MG CR-TABS (METHYLPHENIDATE HCL) 1po two times a day - to fill Sep 17, 2009  #60 x 0   Entered and Authorized by:   Corwin Levins MD   Signed by:   Corwin Levins MD on 09/17/2009   Method used:   Print then Give to Patient   RxID:   [id number]

## 2010-09-12 NOTE — Assessment & Plan Note (Signed)
Summary: CPX/  [facility]  #   Vital Signs:  Patient profile:   55 year old male Height:      69 inches Weight:      216 pounds BMI:     32.01 O2 Sat:      93 % on Room air Temp:     98.4 degrees F oral Pulse rate:   103 / minute BP sitting:   128 / 70  (left arm) Cuff size:   large  Vitals Entered By: [name] Ewing CMA Duncan Dull) (May 20, 2010 1:05 PM)  O2 Flow:  Room air  CC: ADult Physical/RE   Primary Care Provider:  Corwin Levins MD  CC:  ADult Physical/RE.  History of Present Illness: here for wellness; would like med for ED symtpoms;  also rash to toenails but after discussion does not want the terbenafine;  Pt denies CP, worsening sob, doe, wheezing, orthopnea, pnd, worsening LE edema, palps, dizziness or syncope  Pt denies new neuro symptoms such as headache, facial or extremity weakness  No fever, wt loss, night sweats, loss of appetite or other constitutional symptoms  Pt denies polydipsia.  Overall good compliance with meds, trying to follow low chol  diet, wt stable, little excercise however   Problems Prior to Update: 1)  Peripheral Edema  (ICD-782.3) 2)  Erectile Dysfunction, Organic  (ICD-607.84) 3)  Foot Pain, Left  (ICD-729.5) 4)  Morton's Neuroma  (ICD-355.6) 5)  Diabetic Peripheral Neuropathy  (ICD-250.60) 6)  Diabetes Mellitus, Type II  (ICD-250.00) 7)  Dyspnea  (ICD-786.05) 8)  Wrist Pain, Left  (ICD-719.43) 9)  Other Specified Forms of Hearing Loss  (ICD-389.8) 10)  Preventive Health Care  (ICD-V70.0) 11)  Abdominal Pain, Unspecified Site  (ICD-789.00) 12)  Add  (ICD-314.00) 13)  Erectile Dysfunction  (ICD-302.72) 14)  Allergic Rhinitis  (ICD-477.9) 15)  Hyperlipidemia  (ICD-272.4) 16)  Hypertension  (ICD-401.9) 17)  Stricture and Stenosis of Esophagus  (ICD-530.3) 18)  Hiatal Hernia  (ICD-553.3) 19)  Hemorrhoids  (ICD-455.6) 20)  Colonic Polyps  (ICD-211.3) 21)  Sleep Apnea, Obstructive  (ICD-327.23) 22)  Anxiety Depression  (ICD-300.4) 23)  Gerd   (ICD-530.81)  Medications Prior to Update: 1)  Lyrica 100 Mg  Caps (Pregabalin) .... Three Times A Day 2)  Cymbalta 30 Mg  Cpep (Duloxetine Hcl) .... Three Times A Day 3)  Concerta 54 Mg Cr-Tabs (Methylphenidate Hcl) .... Generic - 1 By Mouth Bid  - To Fill Jun 13, 2010 4)  Gabapentin 600 Mg  Tabs (Gabapentin) .... Once Daily 5)  Imipramine Hcl 25 Mg  Tabs (Imipramine Hcl) .... As Needed 6)  Prilosec 20 Mg  Cpdr (Omeprazole) .... Once Daily 7)  Lipitor 10 Mg Tabs (Atorvastatin Calcium) .Marland Kitchen.. 1 By Mouth Once Daily 8)  Tramadol Hcl 50 Mg Tabs (Tramadol Hcl) .Marland Kitchen.. 1 Tab Four Times A Day As Needed 9)  Amlodipine Besylate 5 Mg Tabs (Amlodipine Besylate) .Marland Kitchen.. 1 By Mouth Once Daily 10)  Adult Aspirin Ec Low Strength 81 Mg Tbec (Aspirin) .Marland Kitchen.. 1 By Mouth Once Daily 11)  Viagra 100 Mg Tabs (Sildenafil Citrate) .Marland Kitchen.. 1po Every Other Day As Needed 12)  Zofran Odt 4 Mg Tbdp (Ondansetron) .Marland Kitchen.. 1 Q4h As Needed Nausea 13)  Cialis 20 Mg Tabs (Tadalafil) .Marland Kitchen.. 1 By Mouth Every Other Day As Needed 14)  Oxycodone Hcl 5 Mg Caps (Oxycodone Hcl) .Marland Kitchen.. 1 - 2 By Mouth Q 6 Hrs As Needed Pain 15)  Prednisone 10 Mg Tabs (Prednisone) .... 4po Qd  For 3days, Then 3po Qd For 3days, Then 2po Qd For 3days, Then 1po Qd For 3 Days, Then Stop 16)  Metanx 3-35-2 Mg Tabs (L-Methylfolate-B6-B12) .Marland Kitchen.. 1 By Mouth Two Times A Day 17)  Clonazepam 0.5 Mg Tabs (Clonazepam) .Marland Kitchen.. 1po Two Times A Day As Needed  Current Medications (verified): 1)  Lyrica 100 Mg  Caps (Pregabalin) .... Three Times A Day 2)  Cymbalta 30 Mg  Cpep (Duloxetine Hcl) .... Three Times A Day 3)  Concerta 54 Mg Cr-Tabs (Methylphenidate Hcl) .... Generic - 1 By Mouth Bid  - To Fill Sep 11, 2010 4)  Gabapentin 600 Mg  Tabs (Gabapentin) .... Once Daily 5)  Imipramine Hcl 25 Mg  Tabs (Imipramine Hcl) .... As Needed 6)  Prilosec 20 Mg  Cpdr (Omeprazole) .... Once Daily 7)  Lipitor 10 Mg Tabs (Atorvastatin Calcium) .Marland Kitchen.. 1 By Mouth Once Daily 8)  Tramadol Hcl 50 Mg Tabs  (Tramadol Hcl) .Marland Kitchen.. 1 Tab Four Times A Day As Needed 9)  Amlodipine Besylate 5 Mg Tabs (Amlodipine Besylate) .Marland Kitchen.. 1 By Mouth Once Daily 10)  Adult Aspirin Ec Low Strength 81 Mg Tbec (Aspirin) .Marland Kitchen.. 1 By Mouth Once Daily 11)  Zofran Odt 4 Mg Tbdp (Ondansetron) .Marland Kitchen.. 1 Q4h As Needed Nausea 12)  Levitra 20 Mg Tabs (Vardenafil Hcl) .Marland Kitchen.. 1po Once Daily As Needed 13)  Metanx 3-35-2 Mg Tabs (L-Methylfolate-B6-B12) .Marland Kitchen.. 1 By Mouth Two Times A Day 14)  Clonazepam 0.5 Mg Tabs (Clonazepam) .Marland Kitchen.. 1po Two Times A Day As Needed 15)  Metformin Hcl 500 Mg Xr24h-Tab (Metformin Hcl) .Marland Kitchen.. 1po Once Daily 16)  Hydrochlorothiazide 12.5 Mg Caps (Hydrochlorothiazide) .Marland Kitchen.. 1 By Mouth Once Daily As Needed Swelling Only  Allergies (verified): 1)  ! Codeine  Past History:  Past Surgical History: Last updated: 10/09/2008 Carpal Tunnel Release Rotator Cuff Repair esoph stricture dilation july 2009  Family History: Last updated: 10/09/2008 No FH of Colon Cancer: Family History of Diabetes:Mother and Brother  Family History of Heart Disease: mother, also elevated cholesterol, depression  Social History: Last updated: 10/09/2008 Occupation:Housekeeper @ [employer]  Patient has never smoked.  Alcohol Use - yes - moderately Daily Caffeine Use Illicit Drug Use - no Patient does not get regular exercise.  no children Single  Risk Factors: Exercise: no (02/02/2008)  Risk Factors: Smoking Status: never (02/02/2008)  Past Medical History: HIATAL HERNIA (ICD-553.3) HEMORRHOIDS (ICD-455.6) COLONIC POLYPS (ICD-211.3) SLEEP APNEA, OBSTRUCTIVE (ICD-327.23) - mild ANXIETY DEPRESSION (ICD-300.4) GERD (ICD-530.81) Hypertension Hyperlipidemia Allergic rhinitis E.D.  Diabetes mellitus, type II left foot morton's neuroma  Review of Systems  The patient denies anorexia, fever, weight loss, weight gain, vision loss, decreased hearing, hoarseness, chest pain, syncope, dyspnea on exertion, peripheral edema, prolonged  cough, headaches, hemoptysis, abdominal pain, melena, hematochezia, severe indigestion/heartburn, hematuria, muscle weakness, suspicious skin lesions, transient blindness, difficulty walking, depression, unusual weight change, abnormal bleeding, enlarged lymph nodes, and angioedema.         all otherwise negative per pt -    Physical Exam  General:  alert and overweight-appearing.   Head:  normocephalic and atraumatic.   Eyes:  vision grossly intact, pupils equal, and pupils round.   Ears:  R ear normal and L ear normal.   Nose:  no external deformity and no nasal discharge.   Mouth:  no gingival abnormalities and pharynx pink and moist.   Neck:  supple and no masses.   Lungs:  normal respiratory effort and normal breath sounds.   Heart:  normal rate and regular  rhythm.   Abdomen:  soft, non-tender, and normal bowel sounds.   Msk:  no joint tenderness and no joint swelling.   Extremities:  trace edema bilat, no ulcers Neurologic:  strength normal in all extremities and sensation intact to light touch.   Skin:  color normal and no rashes.   Psych:  not depressed appearing and slightly anxious.     Impression & Recommendations:  Problem # 1:  Preventive Health Care (ICD-V70.0) Overall doing well, age appropriate education and counseling updated and referral for appropriate preventive services done unless declined, immunizations up to date or declined, diet counseling done if overweight, urged to quit smoking if smokes , most recent labs reviewed and current ordered if appropriate, ecg reviewed or declined (interpretation per ECG scanned in the EMR if done); information regarding Medicare Prevention requirements given if appropriate; speciality referrals updated as appropriate   Problem # 2:  DIABETES MELLITUS, TYPE II (ICD-250.00)  His updated medication list for this problem includes:    Adult Aspirin Ec Low Strength 81 Mg Tbec (Aspirin) .Marland Kitchen... 1 by mouth once daily    Metformin Hcl 500  Mg Xr24h-tab (Metformin hcl) .Marland Kitchen... 1po once daily ok to start metformin 1 once daily , also refer to dietary  Labs Reviewed: Creat: 0.8 (05/14/2010)    Reviewed HgBA1c results: 6.6 (05/14/2010)  Orders: Diabetic Clinic Referral (Diabetic)  Problem # 3:  PERIPHERAL NEUROPATHY (ICD-356.9)  with chronic pain on mult meds, stable but wants to change to Pain clinic in [place] (from [place] - too far to drive)  Orders: Pain Clinic Referral (Pain)  Problem # 4:  MORTON'S NEUROMA (ICD-355.6) has seen podiatry - will refer ortho for second opinion per pt request , left foot  Problem # 5:  ERECTILE DYSFUNCTION, ORGANIC (ICD-607.84)  The following medications were removed from the medication list:    Viagra 100 Mg Tabs (Sildenafil citrate) .Marland Kitchen... 1po every other day as needed His updated medication list for this problem includes:    Levitra 20 Mg Tabs (Vardenafil hcl) .Marland Kitchen... 1po once daily as needed treat as above, f/u any worsening signs or symptoms   Problem # 6:  SLEEP APNEA, OBSTRUCTIVE (ICD-327.23) not currently on tx with daytime somnolence he blames on depression and concerta not working welll;  has declined nasal septal surgury per ENT, and declines pulm referral at this time as well for further eval and tx  Problem # 7:  PERIPHERAL EDEMA (ICD-782.3)  His updated medication list for this problem includes:    Hydrochlorothiazide 12.5 Mg Caps (Hydrochlorothiazide) .Marland Kitchen... 1 by mouth once daily as needed swelling only minor, for treat as above, f/u any worsening signs or symptoms , as needed only  Complete Medication List: 1)  Lyrica 100 Mg Caps (Pregabalin) .... Three times a day 2)  Cymbalta 30 Mg Cpep (Duloxetine hcl) .... Three times a day 3)  Concerta 54 Mg Cr-tabs (Methylphenidate hcl) .... Generic - 1 by mouth bid  - to fill Sep 11, 2010 4)  Gabapentin 600 Mg Tabs (Gabapentin) .... Once daily 5)  Imipramine Hcl 25 Mg Tabs (Imipramine hcl) .... As needed 6)  Prilosec 20 Mg Cpdr  (Omeprazole) .... Once daily 7)  Lipitor 10 Mg Tabs (Atorvastatin calcium) .Marland Kitchen.. 1 by mouth once daily 8)  Tramadol Hcl 50 Mg Tabs (Tramadol hcl) .Marland Kitchen.. 1 tab four times a day as needed 9)  Amlodipine Besylate 5 Mg Tabs (Amlodipine besylate) .Marland Kitchen.. 1 by mouth once daily 10)  Adult Aspirin Ec Low Strength  81 Mg Tbec (Aspirin) .Marland Kitchen.. 1 by mouth once daily 11)  Zofran Odt 4 Mg Tbdp (Ondansetron) .Marland Kitchen.. 1 q4h as needed nausea 12)  Levitra 20 Mg Tabs (Vardenafil hcl) .Marland Kitchen.. 1po once daily as needed 13)  Metanx 3-35-2 Mg Tabs (L-methylfolate-b6-b12) .Marland Kitchen.. 1 by mouth two times a day 14)  Clonazepam 0.5 Mg Tabs (Clonazepam) .Marland Kitchen.. 1po two times a day as needed 15)  Metformin Hcl 500 Mg Xr24h-tab (Metformin hcl) .Marland Kitchen.. 1po once daily 16)  Hydrochlorothiazide 12.5 Mg Caps (Hydrochlorothiazide) .Marland Kitchen.. 1 by mouth once daily as needed swelling only  Other Orders: Admin 1st Vaccine (65784) Flu Vaccine 55yrs + [id number]) Orthopedic Surgeon Referral (Ortho Surgeon) Pneumococcal Vaccine (52841) Admin of Any Addtl Vaccine (32440)  Patient Instructions: 1)  you had the flu shot today, and the pneumonia shot today 2)  Please take all new medications as prescribed  - the levitra (for [facility] or  [facility]) 3)  start the metformin at one pill per day, and the fluid pill at one per day as needed  4)  You will be contacted about the referral(s) to: dietary, orthopedic and pain clinic 5)  please call if you change your mind about the referral to pulmonary for further evaluation and treatment of the sleep apnea 6)  Please schedule a follow-up appointment in 6 months with: 7)  BMP prior to visit, ICD-9: 250.02 8)  Lipid Panel prior to visit, ICD-9: 9)  HbgA1C prior to visit, ICD-9: Prescriptions: HYDROCHLOROTHIAZIDE 12.5 MG CAPS (HYDROCHLOROTHIAZIDE) 1 by mouth once daily as needed swelling only  #90 x 3   Entered and Authorized by:   Corwin Levins MD   Signed by:   Corwin Levins MD on 05/20/2010   Method used:   Electronically to         [facility] [address] [phone]* (retail)       [address] [place], [place]  [address]       Ph: [phone]       Fax: [phone]   RxID:   [id number] CONCERTA 54 MG CR-TABS (METHYLPHENIDATE HCL) generic - 1 by mouth bid  - to fill Sep 11, 2010  #60 x 0   Entered and Authorized by:   Corwin Levins MD   Signed by:   Corwin Levins MD on 05/20/2010   Method used:   Print then Give to Patient   RxID:   [id number] CONCERTA 54 MG CR-TABS (METHYLPHENIDATE HCL) generic - 1 by mouth bid  - to fill Aug 12, 2010  #60 x 0   Entered and Authorized by:   Corwin Levins MD   Signed by:   Corwin Levins MD on 05/20/2010   Method used:   Print then Give to Patient   RxID:   [id number] CONCERTA 54 MG CR-TABS (METHYLPHENIDATE HCL) generic - 1 by mouth bid  - to fill Jul 13, 2010  #60 x 0   Entered and Authorized by:   Corwin Levins MD   Signed by:   Corwin Levins MD on 05/20/2010   Method used:   Print then Give to Patient   RxID:   [id number] AMLODIPINE BESYLATE 5 MG TABS (AMLODIPINE BESYLATE) 1 by mouth once daily  #90 x 3   Entered and Authorized by:   Corwin Levins MD   Signed by:   Corwin Levins MD on 05/20/2010   Method used:   Electronically to  [facility] [address] [phone]* (retail)       [address] [place], [place]  [address]       Ph: [phone]       Fax: [phone]   RxID:   [id number] LIPITOR 10 MG TABS (ATORVASTATIN CALCIUM) 1 by mouth once daily  #90 x 3   Entered and Authorized by:   Corwin Levins MD   Signed by:   Corwin Levins MD on 05/20/2010   Method used:   Electronically to        [facility] [address] [phone]* (retail)       [address] [place], [place]  [address]       Ph: [phone]       Fax: [phone]   RxID:   [id number] LEVITRA 20 MG TABS (VARDENAFIL HCL) 1po once daily as needed  #5 x 11   Entered and Authorized by:   Corwin Levins MD   Signed by:   Corwin Levins MD on 05/20/2010    Method used:   Print then Give to Patient   RxID:   [id number] METFORMIN HCL 500 MG XR24H-TAB (METFORMIN HCL) 1po once daily  #90 x 3   Entered and Authorized by:   Corwin Levins MD   Signed by:   Corwin Levins MD on 05/20/2010   Method used:   Electronically to        [facility] [address] [phone]* (retail)       [address] [place], [place]  [address]       Ph: [phone]       Fax: [phone]   RxID:   [id number]   Flu Vaccine Consent Questions     Do you have a history of severe allergic reactions to this vaccine? no    Any prior history of allergic reactions to egg and/or gelatin? no    Do you have a sensitivity to the preservative Thimersol? no    Do you have a past history of Guillan-Barre Syndrome? no    Do you currently have an acute febrile illness? no    Have you ever had a severe reaction to latex? no    Vaccine information given and explained to patient? yes    Are you currently pregnant? no    Lot Number:[id number]   Exp Date:02/08/2011   Site Given  Left Deltoid IMflu1   Immunizations Administered:  Pneumonia Vaccine:    Vaccine Type: Pneumovax    Site: right deltoid    Mfr: Merck    Dose: 0.5 ml    Route: IM    Given by: [name] Ewing CMA (AAMA)    Exp. Date: 10/28/2011    Lot #: [id number]    VIS given: 07/16/09 version given May 20, 2010.

## 2010-09-12 NOTE — Progress Notes (Signed)
Summary: Concerta  Phone Note Call from Patient Call back at Home Phone [phone]   Caller: Patient Summary of Call: Pt called requesting refills of Concerta 36mg  two times a day  Initial call taken by: Margaret Pyle, CMA,  February 12, 2010 4:19 PM  Follow-up for Phone Call        done hardcopy to LIM side B - dahlia  Follow-up by: Corwin Levins MD,  February 12, 2010 5:00 PM  Additional Follow-up for Phone Call Additional follow up Details #1::        Pt informed via VM, Rx in cabinet for pt pick up Additional Follow-up by: Margaret Pyle, CMA,  February 13, 2010 8:00 AM    New/Updated Medications: CONCERTA 36 MG CR-TABS (METHYLPHENIDATE HCL) 1po two times a day - to fill February 13, 2010 Prescriptions: CONCERTA 36 MG CR-TABS (METHYLPHENIDATE HCL) 1po two times a day - to fill February 13, 2010  #60 x 0   Entered and Authorized by:   Corwin Levins MD   Signed by:   Corwin Levins MD on 02/12/2010   Method used:   Print then Give to Patient   RxID:   [id number]

## 2010-09-12 NOTE — Assessment & Plan Note (Signed)
Summary: f/u appt/#/cd   Vital Signs:  Patient profile:   55 year old male Height:      69 inches Weight:      214.75 pounds BMI:     31.83 O2 Sat:      96 % on Room air Temp:     98.8 degrees F oral Pulse rate:   88 / minute BP sitting:   130 / 64  (left arm) Cuff size:   regular  Vitals Entered By: [name] Ewing CMA Duncan Dull) (July 16, 2010 3:37 PM)  O2 Flow:  Room air CC: Discuss refill on concerta/RE   Primary Care Provider:  Corwin Levins MD  CC:  Discuss refill on concerta/RE.  History of Present Illness: here to f/u;  Denies worsening depressive symptoms, suicidal ideation, or panic, but has ongoing anxiety, and unfortunately simply lost his rx refills for the concerta, now out for 2 days and quite "all over the place" per pt with lack of concentration, focus and ability to task completion;  Pt denies CP, worsening sob, doe, wheezing, orthopnea, pnd, worsening LE edema, palps, dizziness or syncope  Pt denies new neuro symptoms such as headache, facial or extremity weakness  Pt denies polydipsia, polyuria, or low sugar symptoms such as shakiness improved with eating.  Overall good compliance with meds, trying to follow low chol, DM diet, wt stable, little excercise however  Needs work note to excuse from steel toed shoes.  No fever, wt loss, night sweats, loss of appetite or other constitutional symptoms  Overall good compliance with meds, and good tolerability.   Problems Prior to Update: 1)  Abdominal Pain, Left Lower Quadrant  (ICD-789.04) 2)  Peripheral Neuropathy  (ICD-356.9) 3)  Peripheral Edema  (ICD-782.3) 4)  Erectile Dysfunction, Organic  (ICD-607.84) 5)  Foot Pain, Left  (ICD-729.5) 6)  Morton's Neuroma  (ICD-355.6) 7)  Diabetes Mellitus, Type II  (ICD-250.00) 8)  Dyspnea  (ICD-786.05) 9)  Wrist Pain, Left  (ICD-719.43) 10)  Other Specified Forms of Hearing Loss  (ICD-389.8) 11)  Preventive Health Care  (ICD-V70.0) 12)  Abdominal Pain, Unspecified Site   (ICD-789.00) 13)  Add  (ICD-314.00) 14)  Erectile Dysfunction  (ICD-302.72) 15)  Allergic Rhinitis  (ICD-477.9) 16)  Hyperlipidemia  (ICD-272.4) 17)  Hypertension  (ICD-401.9) 18)  Stricture and Stenosis of Esophagus  (ICD-530.3) 19)  Hiatal Hernia  (ICD-553.3) 20)  Hemorrhoids  (ICD-455.6) 21)  Colonic Polyps  (ICD-211.3) 22)  Sleep Apnea, Obstructive  (ICD-327.23) 23)  Anxiety Depression  (ICD-300.4) 24)  Gerd  (ICD-530.81)  Medications Prior to Update: 1)  Lyrica 100 Mg  Caps (Pregabalin) .... Three Times A Day 2)  Cymbalta 30 Mg  Cpep (Duloxetine Hcl) .... Three Times A Day 3)  Concerta 54 Mg Cr-Tabs (Methylphenidate Hcl) .... Generic - 1 By Mouth Bid  - To Fill Sep 11, 2010 4)  Gabapentin 600 Mg  Tabs (Gabapentin) .... Once Daily 5)  Imipramine Hcl 25 Mg  Tabs (Imipramine Hcl) .... As Needed 6)  Prilosec 20 Mg  Cpdr (Omeprazole) .... Once Daily 7)  Lipitor 10 Mg Tabs (Atorvastatin Calcium) .Marland Kitchen.. 1 By Mouth Once Daily 8)  Tramadol Hcl 50 Mg Tabs (Tramadol Hcl) .Marland Kitchen.. 1 Tab Four Times A Day As Needed 9)  Amlodipine Besylate 5 Mg Tabs (Amlodipine Besylate) .Marland Kitchen.. 1 By Mouth Once Daily 10)  Adult Aspirin Ec Low Strength 81 Mg Tbec (Aspirin) .Marland Kitchen.. 1 By Mouth Once Daily 11)  Zofran Odt 4 Mg Tbdp (Ondansetron) .Marland KitchenMarland KitchenMarland Kitchen 1  Q4h As Needed Nausea 12)  Levitra 20 Mg Tabs (Vardenafil Hcl) .Marland Kitchen.. 1po Once Daily As Needed 13)  Metanx 3-35-2 Mg Tabs (L-Methylfolate-B6-B12) .Marland Kitchen.. 1 By Mouth Two Times A Day 14)  Clonazepam 0.5 Mg Tabs (Clonazepam) .Marland Kitchen.. 1po Two Times A Day As Needed 15)  Metformin Hcl 500 Mg Xr24h-Tab (Metformin Hcl) .Marland Kitchen.. 1po Once Daily 16)  Hydrochlorothiazide 12.5 Mg Caps (Hydrochlorothiazide) .Marland Kitchen.. 1 By Mouth Once Daily As Needed Swelling Only 17)  Ciprofloxacin Hcl 500 Mg Tabs (Ciprofloxacin Hcl) .Marland Kitchen.. 1po Two Times A Day 18)  Metronidazole 250 Mg Tabs (Metronidazole) .Marland Kitchen.. 1po Four Times Per Day For 10 Days  Current Medications (verified): 1)  Lyrica 100 Mg  Caps (Pregabalin) .... Three  Times A Day 2)  Cymbalta 30 Mg  Cpep (Duloxetine Hcl) .... Three Times A Day 3)  Concerta 54 Mg Cr-Tabs (Methylphenidate Hcl) .... Generic - 1 By Mouth Bid  - To Fill Jul 16, 2010 4)  Gabapentin 600 Mg  Tabs (Gabapentin) .... Once Daily 5)  Imipramine Hcl 25 Mg  Tabs (Imipramine Hcl) .... As Needed 6)  Prilosec 20 Mg  Cpdr (Omeprazole) .... Once Daily 7)  Lipitor 10 Mg Tabs (Atorvastatin Calcium) .Marland Kitchen.. 1 By Mouth Once Daily 8)  Tramadol Hcl 50 Mg Tabs (Tramadol Hcl) .Marland Kitchen.. 1 Tab Four Times A Day As Needed 9)  Amlodipine Besylate 5 Mg Tabs (Amlodipine Besylate) .Marland Kitchen.. 1 By Mouth Once Daily 10)  Adult Aspirin Ec Low Strength 81 Mg Tbec (Aspirin) .Marland Kitchen.. 1 By Mouth Once Daily 11)  Zofran Odt 4 Mg Tbdp (Ondansetron) .Marland Kitchen.. 1 Q4h As Needed Nausea 12)  Levitra 20 Mg Tabs (Vardenafil Hcl) .Marland Kitchen.. 1po Once Daily As Needed 13)  Metanx 3-35-2 Mg Tabs (L-Methylfolate-B6-B12) .Marland Kitchen.. 1 By Mouth Two Times A Day 14)  Clonazepam 0.5 Mg Tabs (Clonazepam) .Marland Kitchen.. 1po Two Times A Day As Needed 15)  Metformin Hcl 500 Mg Xr24h-Tab (Metformin Hcl) .Marland Kitchen.. 1po Once Daily 16)  Hydrochlorothiazide 12.5 Mg Caps (Hydrochlorothiazide) .Marland Kitchen.. 1 By Mouth Once Daily As Needed Swelling Only 17)  Ciprofloxacin Hcl 500 Mg Tabs (Ciprofloxacin Hcl) .Marland Kitchen.. 1po Two Times A Day 18)  Metronidazole 250 Mg Tabs (Metronidazole) .Marland Kitchen.. 1po Four Times Per Day For 10 Days  Allergies (verified): 1)  ! Codeine  Past History:  Past Medical History: Last updated: 05/20/2010 HIATAL HERNIA (ICD-553.3) HEMORRHOIDS (ICD-455.6) COLONIC POLYPS (ICD-211.3) SLEEP APNEA, OBSTRUCTIVE (ICD-327.23) - mild ANXIETY DEPRESSION (ICD-300.4) GERD (ICD-530.81) Hypertension Hyperlipidemia Allergic rhinitis E.D.  Diabetes mellitus, type II left foot morton's neuroma  Past Surgical History: Last updated: 10/09/2008 Carpal Tunnel Release Rotator Cuff Repair esoph stricture dilation july 2009  Social History: Last updated: 10/09/2008 Occupation:Housekeeper @ [employer]    Patient has never smoked.  Alcohol Use - yes - moderately Daily Caffeine Use Illicit Drug Use - no Patient does not get regular exercise.  no children Single  Risk Factors: Exercise: no (02/02/2008)  Risk Factors: Smoking Status: never (02/02/2008)  Review of Systems       all otherwise negative per pt -    Physical Exam  General:  alert and overweight-appearing.  , mild ill  Head:  normocephalic and atraumatic.   Eyes:  vision grossly intact, pupils equal, and pupils round.   Ears:  R ear normal and L ear normal.   Nose:  no external deformity and no nasal discharge.   Mouth:  no gingival abnormalities and pharynx pink and moist.   Neck:  supple and no masses.   Lungs:  normal respiratory  effort and normal breath sounds.   Heart:  normal rate and regular rhythm.   Extremities:  no edema, no erythema  Skin:  color normal and no suspicious lesions.   Psych:  normally interactive and good eye contact.  with poor concentration   Impression & Recommendations:  Problem # 1:  ADD (ICD-314.00) doubt med diversion, online query shows no med overuse,  ok for med refill today  Problem # 2:  DIABETES MELLITUS, TYPE II (ICD-250.00)  His updated medication list for this problem includes:    Adult Aspirin Ec Low Strength 81 Mg Tbec (Aspirin) .Marland Kitchen... 1 by mouth once daily    Metformin Hcl 500 Mg Xr24h-tab (Metformin hcl) .Marland Kitchen... 1po once daily  Labs Reviewed: Creat: 0.8 (06/06/2010)    Reviewed HgBA1c results: 6.6 (05/14/2010) stable overall by hx and exam, ok to continue meds/tx as is , work note given for the shoes  Problem # 3:  HYPERTENSION (ICD-401.9)  His updated medication list for this problem includes:    Amlodipine Besylate 5 Mg Tabs (Amlodipine besylate) .Marland Kitchen... 1 by mouth once daily    Hydrochlorothiazide 12.5 Mg Caps (Hydrochlorothiazide) .Marland Kitchen... 1 by mouth once daily as needed swelling only  BP today: 130/64 Prior BP: 138/78 (06/06/2010)  Labs Reviewed: K+: 4.0  (06/06/2010) Creat: : 0.8 (06/06/2010)   Chol: 155 (05/14/2010)   HDL: 53.70 (05/14/2010)   LDL: 82 (05/14/2010)   TG: 96.0 (05/14/2010) stable overall by hx and exam, ok to continue meds/tx as is   Problem # 4:  HYPERLIPIDEMIA (ICD-272.4)  His updated medication list for this problem includes:    Lipitor 10 Mg Tabs (Atorvastatin calcium) .Marland Kitchen... 1 by mouth once daily  Labs Reviewed: SGOT: 25 (05/14/2010)   SGPT: 28 (05/14/2010)   HDL:53.70 (05/14/2010), 51.00 (06/27/2009)  LDL:82 (05/14/2010), 84 (06/27/2009)  Chol:155 (05/14/2010), 159 (06/27/2009)  Trig:96.0 (05/14/2010), 121.0 (06/27/2009) stable overall by hx and exam, ok to continue meds/tx as is   Complete Medication List: 1)  Lyrica 100 Mg Caps (Pregabalin) .... Three times a day 2)  Cymbalta 30 Mg Cpep (Duloxetine hcl) .... Three times a day 3)  Concerta 54 Mg Cr-tabs (Methylphenidate hcl) .... Generic - 1 by mouth bid  - to fill Jul 16, 2010 4)  Gabapentin 600 Mg Tabs (Gabapentin) .... Once daily 5)  Imipramine Hcl 25 Mg Tabs (Imipramine hcl) .... As needed 6)  Prilosec 20 Mg Cpdr (Omeprazole) .... Once daily 7)  Lipitor 10 Mg Tabs (Atorvastatin calcium) .Marland Kitchen.. 1 by mouth once daily 8)  Tramadol Hcl 50 Mg Tabs (Tramadol hcl) .Marland Kitchen.. 1 tab four times a day as needed 9)  Amlodipine Besylate 5 Mg Tabs (Amlodipine besylate) .Marland Kitchen.. 1 by mouth once daily 10)  Adult Aspirin Ec Low Strength 81 Mg Tbec (Aspirin) .Marland Kitchen.. 1 by mouth once daily 11)  Zofran Odt 4 Mg Tbdp (Ondansetron) .Marland Kitchen.. 1 q4h as needed nausea 12)  Levitra 20 Mg Tabs (Vardenafil hcl) .Marland Kitchen.. 1po once daily as needed 13)  Metanx 3-35-2 Mg Tabs (L-methylfolate-b6-b12) .Marland Kitchen.. 1 by mouth two times a day 14)  Clonazepam 0.5 Mg Tabs (Clonazepam) .Marland Kitchen.. 1po two times a day as needed 15)  Metformin Hcl 500 Mg Xr24h-tab (Metformin hcl) .Marland Kitchen.. 1po once daily 16)  Hydrochlorothiazide 12.5 Mg Caps (Hydrochlorothiazide) .Marland Kitchen.. 1 by mouth once daily as needed swelling only 17)  Ciprofloxacin Hcl 500 Mg Tabs  (Ciprofloxacin hcl) .Marland Kitchen.. 1po two times a day 18)  Metronidazole 250 Mg Tabs (Metronidazole) .Marland Kitchen.. 1po four times per day for 10  days  Patient Instructions: 1)  please see [facility] for yearly exam and diabetic shoes 2)  you are given the work note today 3)  You are given the replacement concerta prescription today 4)  Continue all previous medications as before this visit 5)  Please schedule a follow-up appointment in April 2012 with: 6)  BMP prior to visit, ICD-9: 250.02 7)  Lipid Panel prior to visit, ICD-9: 8)  HbgA1C prior to visit, ICD-9: Prescriptions: CONCERTA 54 MG CR-TABS (METHYLPHENIDATE HCL) generic - 1 by mouth bid  - to fill Jul 16, 2010  #60 x 0   Entered and Authorized by:   Corwin Levins MD   Signed by:   Corwin Levins MD on 07/16/2010   Method used:   Print then Give to Patient   RxID:   [id number]    Orders Added: 1)  Est. Patient Level IV [14782]

## 2010-09-12 NOTE — Assessment & Plan Note (Signed)
Summary: L HAND PAIN  STC   Vital Signs:  Patient profile:   55 year old male Height:      69 inches Weight:      218 pounds BMI:     32.31 O2 Sat:      92 % on Room air Temp:     97.9 degrees F oral Pulse rate:   85 / minute BP sitting:   158 / 82  (left arm) Cuff size:   large  Vitals Entered ByZella Ball Ewing (December 05, 2009 4:24 PM)  O2 Flow:  Room air  CC: Left wrist pain for 4 weeks/RE   Primary Care Provider:  Bradd Canary, MD  CC:  Left wrist pain for 4 weeks/RE.  History of Present Illness: here wtih 4 wks worsening now mild to moderate left wrist and palm pain, tender and mild swelling; no loss of grip strenght or neuritic type pain;  pain sharp, worse at the end of the day after working all day, some better in the am and on weekends when not working;  no trauma, fever or injury.  No prior hx of this or CTS in the past.  Pt denies CP, sob, doe, wheezing, orthopnea, pnd, worsening LE edema, palps, dizziness or syncope   Pt denies new neuro symptoms such as headache, facial or extremity weakness  Needs ADD med refill - overall stable and med seems to work well, withnout undue wt loss, or other side effect.  Overall good med complaicne adn tolerance. No reflux as long as takes the PPI but has run out No dysphagia, wt loss, abd pain.    Problems Prior to Update: 1)  Wrist Pain, Left  (ICD-719.43) 2)  Other Specified Forms of Hearing Loss  (ICD-389.8) 3)  Preventive Health Care  (ICD-V70.0) 4)  Abdominal Pain, Unspecified Site  (ICD-789.00) 5)  Add  (ICD-314.00) 6)  Erectile Dysfunction  (ICD-302.72) 7)  Allergic Rhinitis  (ICD-477.9) 8)  Hyperlipidemia  (ICD-272.4) 9)  Hypertension  (ICD-401.9) 10)  Stricture and Stenosis of Esophagus  (ICD-530.3) 11)  Hiatal Hernia  (ICD-553.3) 12)  Hemorrhoids  (ICD-455.6) 13)  Colonic Polyps  (ICD-211.3) 14)  Sleep Apnea, Obstructive  (ICD-327.23) 15)  Anxiety Depression  (ICD-300.4) 16)  Gerd  (ICD-530.81)  Medications Prior to  Update: 1)  Lyrica 100 Mg  Caps (Pregabalin) .... Three Times A Day 2)  Cymbalta 30 Mg  Cpep (Duloxetine Hcl) .... Three Times A Day 3)  Concerta 36 Mg Cr-Tabs (Methylphenidate Hcl) .Marland Kitchen.. 1po Two Times A Day - To Fill Nov 13, 2009 4)  Gabapentin 600 Mg  Tabs (Gabapentin) .... Once Daily 5)  Imipramine Hcl 25 Mg  Tabs (Imipramine Hcl) .... As Needed 6)  Prilosec 20 Mg  Cpdr (Omeprazole) .... Once Daily 7)  Lipitor 10 Mg Tabs (Atorvastatin Calcium) .Marland Kitchen.. 1 By Mouth Once Daily 8)  Tramadol Hcl 50 Mg Tabs (Tramadol Hcl) .Marland Kitchen.. 1 Tab Four Times A Day As Needed 9)  Amlodipine Besylate 5 Mg Tabs (Amlodipine Besylate) .Marland Kitchen.. 1 By Mouth Once Daily 10)  Adult Aspirin Ec Low Strength 81 Mg Tbec (Aspirin) .Marland Kitchen.. 1 By Mouth Once Daily 11)  Viagra 100 Mg Tabs (Sildenafil Citrate) .Marland Kitchen.. 1po Every Other Day As Needed 12)  Zofran Odt 4 Mg Tbdp (Ondansetron) .Marland Kitchen.. 1 Q4h As Needed Nausea 13)  Cialis 20 Mg Tabs (Tadalafil) .Marland Kitchen.. 1 By Mouth Every Other Day As Needed 14)  Azithromycin 250 Mg Tabs (Azithromycin) .... 2po Qd For 1 Day, Then 1po  Qd For 4days, Then Stop 15)  Hydrocodone-Homatropine 5-1.5 Mg/104ml Syrp (Hydrocodone-Homatropine) .Marland Kitchen.. 1 Tsp By Mouth Q 6 Hrs As Needed Cough  Current Medications (verified): 1)  Lyrica 100 Mg  Caps (Pregabalin) .... Three Times A Day 2)  Cymbalta 30 Mg  Cpep (Duloxetine Hcl) .... Three Times A Day 3)  Concerta 36 Mg Cr-Tabs (Methylphenidate Hcl) .Marland Kitchen.. 1po Two Times A Day - To Fill Nov 13, 2009 4)  Gabapentin 600 Mg  Tabs (Gabapentin) .... Once Daily 5)  Imipramine Hcl 25 Mg  Tabs (Imipramine Hcl) .... As Needed 6)  Prilosec 20 Mg  Cpdr (Omeprazole) .... Once Daily 7)  Lipitor 10 Mg Tabs (Atorvastatin Calcium) .Marland Kitchen.. 1 By Mouth Once Daily 8)  Tramadol Hcl 50 Mg Tabs (Tramadol Hcl) .Marland Kitchen.. 1 Tab Four Times A Day As Needed 9)  Amlodipine Besylate 5 Mg Tabs (Amlodipine Besylate) .Marland Kitchen.. 1 By Mouth Once Daily 10)  Adult Aspirin Ec Low Strength 81 Mg Tbec (Aspirin) .Marland Kitchen.. 1 By Mouth Once Daily 11)   Viagra 100 Mg Tabs (Sildenafil Citrate) .Marland Kitchen.. 1po Every Other Day As Needed 12)  Zofran Odt 4 Mg Tbdp (Ondansetron) .Marland Kitchen.. 1 Q4h As Needed Nausea 13)  Cialis 20 Mg Tabs (Tadalafil) .Marland Kitchen.. 1 By Mouth Every Other Day As Needed 14)  Oxycodone Hcl 5 Mg Caps (Oxycodone Hcl) .Marland Kitchen.. 1 - 2 By Mouth Q 6 Hrs As Needed Pain 15)  Prednisone 10 Mg Tabs (Prednisone) .... 4po Qd For 3days, Then 3po Qd For 3days, Then 2po Qd For 3days, Then 1po Qd For 3 Days, Then Stop  Allergies (verified): 1)  ! Codeine  Past History:  Past Medical History: Last updated: 10/09/2008 HIATAL HERNIA (ICD-553.3) HEMORRHOIDS (ICD-455.6) COLONIC POLYPS (ICD-211.3) SLEEP APNEA, OBSTRUCTIVE (ICD-327.23) - mild ANXIETY DEPRESSION (ICD-300.4) GERD (ICD-530.81) glucose intolerance Hypertension Hyperlipidemia Allergic rhinitis E.D.   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  Alcohol Use - yes - moderately Daily Caffeine Use Illicit Drug Use - no Patient does not get regular exercise.  no children Single  Risk Factors: Exercise: no (02/02/2008)  Risk Factors: Smoking Status: never (02/02/2008)  Review of Systems       all otherwise negative per pt -    Physical Exam  General:  alert and overweight-appearing.   Head:  normocephalic and atraumatic.   Eyes:  vision grossly intact, pupils equal, and pupils round.   Ears:  R ear normal and L ear normal.   Nose:  no external deformity and no nasal discharge.   Mouth:  no gingival abnormalities and pharynx pink and moist.   Neck:  supple and no masses.   Lungs:  normal respiratory effort and normal breath sounds.   Heart:  normal rate and regular rhythm.   Msk:  no joint tenderness and no joint swelling.  , but has tenderness and diffuse soft tissue swelling of wrist and hand  without erythema or red streaks Extremities:  no edema, no erythema  Neurologic:  strength normal in all extremities and sensation intact to light touch.     Impression & Recommendations:  Problem # 1:  WRIST PAIN, LEFT (ICD-719.43)  I suspect ? 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 1 by mouth once daily  BP today: 158/82 Prior BP: 132/82 (08/22/2009)  Labs Reviewed: K+: 3.6 (06/27/2009) Creat: : 0.7 (06/27/2009)   Chol: 159 (06/27/2009)   HDL: 51.00 (06/27/2009)   LDL: 84 (06/27/2009)   TG: 121.0 (06/27/2009) mild elev today, likely situational, ok to follow, continue same treatment   Problem # 3:  ADD (ICD-314.00) stable overall by hx and exam, ok to continue meds/tx as is  - to cont the concerta  Problem # 4:  GERD (ICD-530.81)  His updated medication list for this problem includes:    Prilosec 20 Mg Cpdr (Omeprazole) ..... Once daily stable overall by hx and exam, ok to continue meds/tx as is   Complete Medication List: 1)  Lyrica 100 Mg Caps (Pregabalin) .... Three times a day 2)  Cymbalta 30 Mg Cpep (Duloxetine hcl) .... Three times a day 3)  Concerta 36 Mg Cr-tabs (Methylphenidate hcl) .Marland Kitchen.. 1po two times a day - to fill Nov 13, 2009 4)  Gabapentin 600 Mg Tabs (Gabapentin) .... Once daily 5)  Imipramine Hcl 25 Mg Tabs (Imipramine hcl) .... As needed 6)  Prilosec 20 Mg Cpdr (Omeprazole) .... Once daily 7)  Lipitor 10 Mg Tabs (Atorvastatin calcium) .Marland Kitchen.. 1 by mouth once daily 8)  Tramadol Hcl 50 Mg Tabs (Tramadol hcl) .Marland Kitchen.. 1 tab four times a day as  needed 9)  Amlodipine Besylate 5 Mg Tabs (Amlodipine besylate) .Marland Kitchen.. 1 by mouth once daily 10)  Adult Aspirin Ec Low Strength 81 Mg Tbec (Aspirin) .Marland Kitchen.. 1 by mouth once daily 11)  Viagra 100 Mg Tabs (Sildenafil citrate) .Marland Kitchen.. 1po every other day as needed 12)  Zofran Odt 4 Mg Tbdp (Ondansetron) .Marland Kitchen.. 1 q4h as needed nausea 13)  Cialis 20 Mg Tabs (Tadalafil) .Marland Kitchen.. 1 by mouth every other day as needed 14)  Oxycodone Hcl 5 Mg Caps (Oxycodone hcl) .Marland Kitchen.. 1 - 2 by mouth q 6 hrs as needed pain 15)  Prednisone 10 Mg Tabs (Prednisone) .... 4po qd for 3days, then 3po qd for 3days, then 2po qd for 3days, then 1po qd for 3 days, then stop  Patient Instructions: 1)  you had the steroid shot today 2)  Please take all new medications as prescribed  3)  Continue all previous medications as before this visit  4)  Please schedule a follow-up appointment in 7 mo with CPX labs Prescriptions: PREDNISONE 10 MG TABS (PREDNISONE) 4po qd for 3days, then 3po qd for 3days, then 2po qd for 3days, then 1po qd for 3 days, then stop  #30 x 0   Entered and Authorized by:   Corwin Levins MD   Signed by:   Corwin Levins MD on 12/05/2009   Method used:   Print then Give to Patient   RxID:   [id number] OXYCODONE HCL 5 MG CAPS (OXYCODONE HCL) 1 - 2 by mouth q 6 hrs as needed pain  #60 x 0   Entered and Authorized by:   Corwin Levins MD   Signed by:   Corwin Levins MD on 12/05/2009   Method used:   Print then Give to Patient   RxID:   [id number]    Medication Administration  Injection # 1:    Medication:  Depo- Medrol 40mg     Diagnosis: WRIST PAIN, LEFT (QVZ-563.87)    Route: IM    Site: LUOQ gluteus    Exp Date: 06/2012    Lot #: [id number]    Mfr: [employer]    Given by: [name] Ewing (December 05, 2009 4:55 PM)  Injection # 2:    Medication: Depo- Medrol 80mg     Diagnosis: WRIST PAIN, LEFT (ICD-719.43)    Route: IM    Site: LUOQ gluteus    Exp Date: 06/2012    Lot #: [id number]    Mfr: [employer]    Given by: [name]  Ewing (December 05, 2009 4:55 PM)  Orders Added: 1)  Depo- Medrol 40mg  [J1030] 2)  Depo- Medrol 80mg  [J1040] 3)  Admin of Therapeutic Inj  intramuscular or subcutaneous [96372] 4)  Est. Patient Level IV [84166]

## 2010-09-12 NOTE — Progress Notes (Signed)
Summary: Concerta  Phone Note Call from Patient Call back at Home Phone [phone]   Caller: Patient Summary of Call: pt called requesting refills of Concerta 36mg   Initial call taken by: Margaret Pyle, CMA,  January 14, 2010 8:37 AM  Follow-up for Phone Call        pt informed via VM, Rx in cabinet for pt pick up Follow-up by: Margaret Pyle, CMA,  January 14, 2010 8:49 AM    New/Updated Medications: CONCERTA 36 MG CR-TABS (METHYLPHENIDATE HCL) 1po two times a day - to fill January 14, 2010 Prescriptions: CONCERTA 36 MG CR-TABS (METHYLPHENIDATE HCL) 1po two times a day - to fill January 14, 2010  #60 x 0   Entered and Authorized by:   Corwin Levins MD   Signed by:   Corwin Levins MD on 01/14/2010   Method used:   Print then Give to Patient   RxID:   [id number]  done hardcopy to LIM side B - dahlia Corwin Levins MD  January 14, 2010 8:39 AM

## 2010-09-12 NOTE — Progress Notes (Signed)
Summary: Concerta  Phone Note Call from Patient Call back at Home Phone [phone]   Caller: Patient Summary of Call: Pt called requesting refill for Concerta 54mg  3 mth supply Initial call taken by: Margaret Pyle, CMA,  April 09, 2010 3:50 PM  Follow-up for Phone Call        done hardcopy to LIM side B - dahlia  Follow-up by: Corwin Levins MD,  April 09, 2010 4:57 PM  Additional Follow-up for Phone Call Additional follow up Details #1::        Pt informed via VM, Rx in cabinet for pt pick up Additional Follow-up by: Margaret Pyle, CMA,  April 10, 2010 9:01 AM    New/Updated Medications: CONCERTA 54 MG CR-TABS (METHYLPHENIDATE HCL) generic - 1 by mouth bid  - to fill sept 4, 2011 CONCERTA 54 MG CR-TABS (METHYLPHENIDATE HCL) generic - 1 by mouth bid  - to fill May 14, 2010 CONCERTA 54 MG CR-TABS (METHYLPHENIDATE HCL) generic - 1 by mouth bid  - to fill Jun 13, 2010 Prescriptions: CONCERTA 54 MG CR-TABS (METHYLPHENIDATE HCL) generic - 1 by mouth bid  - to fill Jun 13, 2010  #60 x 0   Entered and Authorized by:   Corwin Levins MD   Signed by:   Corwin Levins MD on 04/09/2010   Method used:   Print then Give to Patient   RxID:   [id number] CONCERTA 54 MG CR-TABS (METHYLPHENIDATE HCL) generic - 1 by mouth bid  - to fill May 14, 2010  #60 x 0   Entered and Authorized by:   Corwin Levins MD   Signed by:   Corwin Levins MD on 04/09/2010   Method used:   Print then Give to Patient   RxID:   [id number] CONCERTA 54 MG CR-TABS (METHYLPHENIDATE HCL) generic - 1 by mouth bid  - to fill sept 4, 2011  #60 x 0   Entered and Authorized by:   Corwin Levins MD   Signed by:   Corwin Levins MD on 04/09/2010   Method used:   Print then Give to Patient   RxID:   [id number]

## 2010-09-12 NOTE — Progress Notes (Signed)
Summary: Concerta  Phone Note Call from Patient Call back at Home Phone [phone]   Caller: Patient Summary of Call: pt called requesting refills of Concerta Initial call taken by: Margaret Pyle, CMA,  October 11, 2009 8:19 AM  Follow-up for Phone Call        pt informed via secure VM, rx in cabinet for pick up Follow-up by: Margaret Pyle, CMA,  October 11, 2009 1:24 PM    New/Updated Medications: CONCERTA 36 MG CR-TABS (METHYLPHENIDATE HCL) 1po two times a day - to fill Oct 13, 2005 Prescriptions: CONCERTA 36 MG CR-TABS (METHYLPHENIDATE HCL) 1po two times a day - to fill Oct 13, 2005  #60 x 0   Entered and Authorized by:   Corwin Levins MD   Signed by:   Corwin Levins MD on 10/11/2009   Method used:   Print then Give to Patient   RxID:   [id number]  done hardcopy to LIM side B - dahlia  Corwin Levins MD  October 11, 2009 1:04 PM

## 2010-09-12 NOTE — Medication Information (Signed)
Summary: Prior auhto & approved for Methylphenidate/[facility]  Prior auhto & approved for Methylphenidate/[facility]   Imported By: Sherian Rein 07/19/2010 11:41:15  _____________________________________________________________________  External Attachment:    Type:   Image     Comment:   External Document

## 2010-09-12 NOTE — Letter (Signed)
Summary: Out of Work  [facility]  [address] [place], [place] [address]   Phone: [phone]  Fax: [phone]    August 22, 2009   Employee:  Frank [name]    To Whom It May Concern:   For Medical reasons, please excuse the above named employee from work for the following dates:  Start:  08/20/2009    End :08/27/2009    If you need additional information, please feel free to contact our office.         Sincerely,    Dr. Oliver Barre

## 2010-09-12 NOTE — Letter (Signed)
Summary: Generic Letter  [facility]  [address] [place], [place] [address]   Phone: [phone]  Fax: [phone]    07/16/2010  EDWIN BAINES [address] [place], [place]  [address]  Dear Mr. Runions,      This letter is to whom it may concern, to state  that due to your chronic conditions of Diabetes and  peripheral neuropathy, you should not wear steel-toed   shoes at work as this may result in ulcers, infection,   or partial foot loss.           Sincerely,   Oliver Barre MD

## 2010-09-12 NOTE — Letter (Signed)
Summary: [facility]  [facility]   Imported By: Sherian Rein 10/29/2009 08:05:49  _____________________________________________________________________  External Attachment:    Type:   Image     Comment:   External Document

## 2010-09-12 NOTE — Assessment & Plan Note (Signed)
**Note De-Identified  Obfuscation** Summary: congestion/sore throat/cd   Vital Signs:  Patient profile:   55 year old male Height:      69 inches Weight:      212.25 pounds BMI:     31.46 O2 Sat:      95 % on Room air Temp:     98.6 degrees F oral Pulse rate:   95 / minute BP sitting:   132 / 80  (left arm) Cuff size:   regular  Vitals Entered By: [name] Ewing CMA (AAMA) (March 12, 2010 2:26 PM)  O2 Flow:  Room air CC: Congestion, throat discomfort/RE   Primary Care Provider:  Bradd Canary, MD  CC:  Congestion and throat discomfort/RE.  History of Present Illness: here with c/o sob/doe starting approx 10 days , constant, persistent with a sort of choking and congestion in the upper chest but no fever, headache, ST, or cough and Pt denies CP, wheezing, orthopnea, pnd, worsening LE edema, palps, dizziness or syncope . Pt denies new neuro symptoms such as headache, facial or extremity weakness    A close friend died late 03-04-2023 and he has had difficutly coping.  Overall good complaicne with meds, including the cymbalta three times a day.  Has had near panic enough to ER several times in the past 10 days.  Also wtih occasionaly nausea, but no vomiting, bowel change, wt loss, dysphagia  or blood.  No rash , joint pain and No fever, wt loss, night sweats, loss of appetite or other constitutional symptoms  Does have several friends for support, does not think he needs counseling at this time.  Denies worsening depressive symptoms or suicidal ideation.   Also requests a 90 rx of his metanx prescribed per ortho a few months ago.   No missed work recent. Also mentions his concerta that is now generic is not working as well - even before his recent emotional upset, with more difficulty concentratin and completing tasks.    Problems Prior to Update: 1)  Dyspnea  (ICD-786.05) 2)  Wrist Pain, Left  (ICD-719.43) 3)  Other Specified Forms of Hearing Loss  (ICD-389.8) 4)  Preventive Health Care  (ICD-V70.0) 5)  Abdominal Pain, Unspecified  Site  (ICD-789.00) 6)  Add  (ICD-314.00) 7)  Erectile Dysfunction  (ICD-302.72) 8)  Allergic Rhinitis  (ICD-477.9) 9)  Hyperlipidemia  (ICD-272.4) 10)  Hypertension  (ICD-401.9) 11)  Stricture and Stenosis of Esophagus  (ICD-530.3) 12)  Hiatal Hernia  (ICD-553.3) 13)  Hemorrhoids  (ICD-455.6) 14)  Colonic Polyps  (ICD-211.3) 15)  Sleep Apnea, Obstructive  (ICD-327.23) 16)  Anxiety Depression  (ICD-300.4) 17)  Gerd  (ICD-530.81)  Medications Prior to Update: 1)  Lyrica 100 Mg  Caps (Pregabalin) .... Three Times A Day 2)  Cymbalta 30 Mg  Cpep (Duloxetine Hcl) .... Three Times A Day 3)  Concerta 36 Mg Cr-Tabs (Methylphenidate Hcl) .Marland Kitchen.. 1po Two Times A Day - To Fill February 13, 2010 4)  Gabapentin 600 Mg  Tabs (Gabapentin) .... Once Daily 5)  Imipramine Hcl 25 Mg  Tabs (Imipramine Hcl) .... As Needed 6)  Prilosec 20 Mg  Cpdr (Omeprazole) .... Once Daily 7)  Lipitor 10 Mg Tabs (Atorvastatin Calcium) .Marland Kitchen.. 1 By Mouth Once Daily 8)  Tramadol Hcl 50 Mg Tabs (Tramadol Hcl) .Marland Kitchen.. 1 Tab Four Times A Day As Needed 9)  Amlodipine Besylate 5 Mg Tabs (Amlodipine Besylate) .Marland Kitchen.. 1 By Mouth Once Daily 10)  Adult Aspirin Ec Low Strength 81 Mg Tbec (Aspirin) .Marland Kitchen.. 1 By Mouth **Note De-Identified  Obfuscation** Once Daily 11)  Viagra 100 Mg Tabs (Sildenafil Citrate) .Marland Kitchen.. 1po Every Other Day As Needed 12)  Zofran Odt 4 Mg Tbdp (Ondansetron) .Marland Kitchen.. 1 Q4h As Needed Nausea 13)  Cialis 20 Mg Tabs (Tadalafil) .Marland Kitchen.. 1 By Mouth Every Other Day As Needed 14)  Oxycodone Hcl 5 Mg Caps (Oxycodone Hcl) .Marland Kitchen.. 1 - 2 By Mouth Q 6 Hrs As Needed Pain 15)  Prednisone 10 Mg Tabs (Prednisone) .... 4po Qd For 3days, Then 3po Qd For 3days, Then 2po Qd For 3days, Then 1po Qd For 3 Days, Then Stop  Current Medications (verified): 1)  Lyrica 100 Mg  Caps (Pregabalin) .... Three Times A Day 2)  Cymbalta 30 Mg  Cpep (Duloxetine Hcl) .... Three Times A Day 3)  Concerta 54 Mg Cr-Tabs (Methylphenidate Hcl) .... Generic - 1 By Mouth Once Daily  - To Fill Mar 15, 2010 4)   Gabapentin 600 Mg  Tabs (Gabapentin) .... Once Daily 5)  Imipramine Hcl 25 Mg  Tabs (Imipramine Hcl) .... As Needed 6)  Prilosec 20 Mg  Cpdr (Omeprazole) .... Once Daily 7)  Lipitor 10 Mg Tabs (Atorvastatin Calcium) .Marland Kitchen.. 1 By Mouth Once Daily 8)  Tramadol Hcl 50 Mg Tabs (Tramadol Hcl) .Marland Kitchen.. 1 Tab Four Times A Day As Needed 9)  Amlodipine Besylate 5 Mg Tabs (Amlodipine Besylate) .Marland Kitchen.. 1 By Mouth Once Daily 10)  Adult Aspirin Ec Low Strength 81 Mg Tbec (Aspirin) .Marland Kitchen.. 1 By Mouth Once Daily 11)  Viagra 100 Mg Tabs (Sildenafil Citrate) .Marland Kitchen.. 1po Every Other Day As Needed 12)  Zofran Odt 4 Mg Tbdp (Ondansetron) .Marland Kitchen.. 1 Q4h As Needed Nausea 13)  Cialis 20 Mg Tabs (Tadalafil) .Marland Kitchen.. 1 By Mouth Every Other Day As Needed 14)  Oxycodone Hcl 5 Mg Caps (Oxycodone Hcl) .Marland Kitchen.. 1 - 2 By Mouth Q 6 Hrs As Needed Pain 15)  Prednisone 10 Mg Tabs (Prednisone) .... 4po Qd For 3days, Then 3po Qd For 3days, Then 2po Qd For 3days, Then 1po Qd For 3 Days, Then Stop 16)  Metanx 3-35-2 Mg Tabs (L-Methylfolate-B6-B12) .Marland Kitchen.. 1 By Mouth Two Times A Day 17)  Clonazepam 0.5 Mg Tabs (Clonazepam) .Marland Kitchen.. 1po Two Times A Day As Needed  Allergies (verified): 1)  ! Codeine  Past History:  Past Medical History: Last updated: 10/09/2008 HIATAL HERNIA (ICD-553.3) HEMORRHOIDS (ICD-455.6) COLONIC POLYPS (ICD-211.3) SLEEP APNEA, OBSTRUCTIVE (ICD-327.23) - mild ANXIETY DEPRESSION (ICD-300.4) GERD (ICD-530.81) glucose intolerance Hypertension Hyperlipidemia Allergic rhinitis E.D.   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  Alcohol Use - yes - moderately Daily Caffeine Use Illicit Drug Use - no Patient does not get regular exercise.  no children Single  Risk Factors: Exercise: no (02/02/2008)  Risk Factors: Smoking Status: never  (02/02/2008)  Review of Systems       all otherwise negative per pt -    Physical Exam  General:  alert and overweight-appearing.   Head:  normocephalic and atraumatic.   Eyes:  vision grossly intact, pupils equal, and pupils round.   Ears:  R ear normal and L ear normal.   Nose:  no external deformity and no nasal discharge.   Mouth:  no gingival abnormalities and pharynx pink and moist.   Neck:  supple and no masses.   Lungs:  normal respiratory effort and normal breath sounds.   Heart:  normal rate and regular **Note De-Identified  Obfuscation** rhythm.   Abdomen:  soft, non-tender, and normal bowel sounds.   Msk:  no joint tenderness and no joint swelling.   Extremities:  no edema, no erythema  Skin:  color normal and no rashes.   Psych:  not depressed appearing and severely anxious.     Impression & Recommendations:  Problem # 1:  DYSPNEA (ICD-786.05)  most likely due to anxiety it seems, but will check ecg, cxr ; declines further labs today  Orders: EKG w/ Interpretation (93000) T-2 View CXR, Same Day (71020.5TC)  Problem # 2:  ADD (ICD-314.00) ok to incr the concerta generic to 54 mg -   Problem # 3:  HYPERTENSION (ICD-401.9)  His updated medication list for this problem includes:    Amlodipine Besylate 5 Mg Tabs (Amlodipine besylate) .Marland Kitchen... 1 by mouth once daily  BP today: 132/80 Prior BP: 158/82 (12/05/2009)  Labs Reviewed: K+: 3.6 (06/27/2009) Creat: : 0.7 (06/27/2009)   Chol: 159 (06/27/2009)   HDL: 51.00 (06/27/2009)   LDL: 84 (06/27/2009)   TG: 121.0 (06/27/2009) stable overall by hx and exam, ok to continue meds/tx as is   Complete Medication List: 1)  Lyrica 100 Mg Caps (Pregabalin) .... Three times a day 2)  Cymbalta 30 Mg Cpep (Duloxetine hcl) .... Three times a day 3)  Concerta 54 Mg Cr-tabs (Methylphenidate hcl) .... Generic - 1 by mouth once daily  - to fill Mar 15, 2010 4)  Gabapentin 600 Mg Tabs (Gabapentin) .... Once daily 5)  Imipramine Hcl 25 Mg Tabs (Imipramine hcl) .... As  needed 6)  Prilosec 20 Mg Cpdr (Omeprazole) .... Once daily 7)  Lipitor 10 Mg Tabs (Atorvastatin calcium) .Marland Kitchen.. 1 by mouth once daily 8)  Tramadol Hcl 50 Mg Tabs (Tramadol hcl) .Marland Kitchen.. 1 tab four times a day as needed 9)  Amlodipine Besylate 5 Mg Tabs (Amlodipine besylate) .Marland Kitchen.. 1 by mouth once daily 10)  Adult Aspirin Ec Low Strength 81 Mg Tbec (Aspirin) .Marland Kitchen.. 1 by mouth once daily 11)  Viagra 100 Mg Tabs (Sildenafil citrate) .Marland Kitchen.. 1po every other day as needed 12)  Zofran Odt 4 Mg Tbdp (Ondansetron) .Marland Kitchen.. 1 q4h as needed nausea 13)  Cialis 20 Mg Tabs (Tadalafil) .Marland Kitchen.. 1 by mouth every other day as needed 14)  Oxycodone Hcl 5 Mg Caps (Oxycodone hcl) .Marland Kitchen.. 1 - 2 by mouth q 6 hrs as needed pain 15)  Prednisone 10 Mg Tabs (Prednisone) .... 4po qd for 3days, then 3po qd for 3days, then 2po qd for 3days, then 1po qd for 3 days, then stop 16)  Metanx 3-35-2 Mg Tabs (L-methylfolate-b6-b12) .Marland Kitchen.. 1 by mouth two times a day 17)  Clonazepam 0.5 Mg Tabs (Clonazepam) .Marland Kitchen.. 1po two times a day as needed  Patient Instructions: 1)  Please take all new medications as prescribed 2)  Continue all previous medications as before this visit  3)  Your EKG was OK today 4)  Please go to Radiology in the basement level for your X-Ray today  5)  Please schedule a follow-up appointment in 3 months with CPX labs Prescriptions: CONCERTA 54 MG CR-TABS (METHYLPHENIDATE HCL) generic - 1 by mouth once daily  - to fill Mar 15, 2010  #30 x 0   Entered and Authorized by:   Corwin Levins MD   Signed by:   Corwin Levins MD on 03/12/2010   Method used:   Print then Give to Patient   RxID:   [id number] CLONAZEPAM 0.5 MG TABS (CLONAZEPAM) 1po two times **Note De-Identified  Obfuscation** a day as needed  #60 x 3   Entered and Authorized by:   Corwin Levins MD   Signed by:   Corwin Levins MD on 03/12/2010   Method used:   Print then Give to Patient   RxID:   [id number] METANX 3-35-2 MG TABS (L-METHYLFOLATE-B6-B12) 1 by mouth two times a day  #180 x 3   Entered and  Authorized by:   Corwin Levins MD   Signed by:   Corwin Levins MD on 03/12/2010   Method used:   Print then Give to Patient   RxID:   [id number]

## 2010-09-12 NOTE — Assessment & Plan Note (Signed)
Summary: SINUS/NWS   Vital Signs:  Patient profile:   55 year old male Height:      69 inches Weight:      211 pounds BMI:     31.27 O2 Sat:      91 % on Room air Temp:     97.6 degrees F oral Pulse rate:   91 / minute BP sitting:   132 / 82  (left arm) Cuff size:   regular  Vitals Entered ByMarland Kitchen [name] Ewing (August 22, 2009 11:59 AM)  O2 Flow:  Room air CC: nasal congestion,cough, eye and head pressure/RE   Primary Care Provider:  Bradd Canary, MD  CC:  nasal congestion, cough, and eye and head pressure/RE.  History of Present Illness: here with mild ST for 3 days, progressed to marked prod cough with greenish sputum but without wheezing, and Pt denies CP, sob, doe, wheezing, orthopnea, pnd, worsening LE edema, palps, dizziness or syncope     Problems Prior to Update: 1)  Bronchitis-acute  (ICD-466.0) 2)  Other Specified Forms of Hearing Loss  (ICD-389.8) 3)  Preventive Health Care  (ICD-V70.0) 4)  Abdominal Pain, Unspecified Site  (ICD-789.00) 5)  Add  (ICD-314.00) 6)  Erectile Dysfunction  (ICD-302.72) 7)  Allergic Rhinitis  (ICD-477.9) 8)  Hyperlipidemia  (ICD-272.4) 9)  Hypertension  (ICD-401.9) 10)  Stricture and Stenosis of Esophagus  (ICD-530.3) 11)  Hiatal Hernia  (ICD-553.3) 12)  Hemorrhoids  (ICD-455.6) 13)  Colonic Polyps  (ICD-211.3) 14)  Sleep Apnea, Obstructive  (ICD-327.23) 15)  Anxiety Depression  (ICD-300.4) 16)  Gerd  (ICD-530.81)  Medications Prior to Update: 1)  Lyrica 100 Mg  Caps (Pregabalin) .... Three Times A Day 2)  Cymbalta 30 Mg  Cpep (Duloxetine Hcl) .... Three Times A Day 3)  Concerta 36 Mg Cr-Tabs (Methylphenidate Hcl) .Marland Kitchen.. 1po Two Times A Day - To Fill Aug 14, 2009 4)  Gabapentin 600 Mg  Tabs (Gabapentin) .... Once Daily 5)  Imipramine Hcl 25 Mg  Tabs (Imipramine Hcl) .... As Needed 6)  Prilosec 20 Mg  Cpdr (Omeprazole) .... Once Daily 7)  Lipitor 10 Mg Tabs (Atorvastatin Calcium) .Marland Kitchen.. 1 By Mouth Once Daily 8)  Tramadol Hcl 50 Mg Tabs  (Tramadol Hcl) .Marland Kitchen.. 1 Tab Four Times A Day As Needed 9)  Amlodipine Besylate 5 Mg Tabs (Amlodipine Besylate) .Marland Kitchen.. 1 By Mouth Once Daily 10)  Adult Aspirin Ec Low Strength 81 Mg Tbec (Aspirin) .Marland Kitchen.. 1 By Mouth Once Daily 11)  Viagra 100 Mg Tabs (Sildenafil Citrate) .Marland Kitchen.. 1po Every Other Day As Needed 12)  Zofran Odt 4 Mg Tbdp (Ondansetron) .Marland Kitchen.. 1 Q4h As Needed Nausea 13)  Cialis 20 Mg Tabs (Tadalafil) .Marland Kitchen.. 1 By Mouth Every Other Day As Needed  Current Medications (verified): 1)  Lyrica 100 Mg  Caps (Pregabalin) .... Three Times A Day 2)  Cymbalta 30 Mg  Cpep (Duloxetine Hcl) .... Three Times A Day 3)  Concerta 36 Mg Cr-Tabs (Methylphenidate Hcl) .Marland Kitchen.. 1po Two Times A Day - To Fill Aug 14, 2009 4)  Gabapentin 600 Mg  Tabs (Gabapentin) .... Once Daily 5)  Imipramine Hcl 25 Mg  Tabs (Imipramine Hcl) .... As Needed 6)  Prilosec 20 Mg  Cpdr (Omeprazole) .... Once Daily 7)  Lipitor 10 Mg Tabs (Atorvastatin Calcium) .Marland Kitchen.. 1 By Mouth Once Daily 8)  Tramadol Hcl 50 Mg Tabs (Tramadol Hcl) .Marland Kitchen.. 1 Tab Four Times A Day As Needed 9)  Amlodipine Besylate 5 Mg Tabs (Amlodipine Besylate) .Marland Kitchen.. 1 By Mouth  Once Daily 10)  Adult Aspirin Ec Low Strength 81 Mg Tbec (Aspirin) .Marland Kitchen.. 1 By Mouth Once Daily 11)  Viagra 100 Mg Tabs (Sildenafil Citrate) .Marland Kitchen.. 1po Every Other Day As Needed 12)  Zofran Odt 4 Mg Tbdp (Ondansetron) .Marland Kitchen.. 1 Q4h As Needed Nausea 13)  Cialis 20 Mg Tabs (Tadalafil) .Marland Kitchen.. 1 By Mouth Every Other Day As Needed 14)  Azithromycin 250 Mg Tabs (Azithromycin) .... 2po Qd For 1 Day, Then 1po Qd For 4days, Then Stop 15)  Hydrocodone-Homatropine 5-1.5 Mg/35ml Syrp (Hydrocodone-Homatropine) .Marland Kitchen.. 1 Tsp By Mouth Q 6 Hrs As Needed Cough  Allergies (verified): 1)  ! Codeine  Past History:  Past Medical History: Last updated: 10/09/2008 HIATAL HERNIA (ICD-553.3) HEMORRHOIDS (ICD-455.6) COLONIC POLYPS (ICD-211.3) SLEEP APNEA, OBSTRUCTIVE (ICD-327.23) - mild ANXIETY DEPRESSION (ICD-300.4) GERD  (ICD-530.81) glucose intolerance Hypertension Hyperlipidemia Allergic rhinitis E.D.   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  Alcohol Use - yes - moderately Daily Caffeine Use Illicit Drug Use - no Patient does not get regular exercise.  no children Single  Risk Factors: Exercise: no (02/02/2008)  Risk Factors: Smoking Status: never (02/02/2008)  Review of Systems       all otherwise negative per pt   Physical Exam  General:  alert and overweight-appearing.   Head:  normocephalic and atraumatic.   Eyes:  vision grossly intact, pupils equal, and pupils round.   Ears:  bilat tm's red, sinus nontender Nose:  nasal dischargemucosal pallor and mucosal erythema.   Mouth:  pharyngeal erythema and fair dentition.   Neck:  supple and no masses.   Lungs:  normal respiratory effort and normal breath sounds.   Heart:  normal rate and regular rhythm.   Extremities:  no edema, no erythema    Impression & Recommendations:  Problem # 1:  BRONCHITIS-ACUTE (ICD-466.0)  His updated medication list for this problem includes:    Azithromycin 250 Mg Tabs (Azithromycin) .Marland Kitchen... 2po qd for 1 day, then 1po qd for 4days, then stop    Hydrocodone-homatropine 5-1.5 Mg/73ml Syrp (Hydrocodone-homatropine) .Marland Kitchen... 1 tsp by mouth q 6 hrs as needed cough treat as above, f/u any worsening signs or symptoms   Problem # 2:  HYPERTENSION (ICD-401.9)  His updated medication list for this problem includes:    Amlodipine Besylate 5 Mg Tabs (Amlodipine besylate) .Marland Kitchen... 1 by mouth once daily  BP today: 132/82 Prior BP: 140/82 (06/27/2009)  Labs Reviewed: K+: 3.6 (06/27/2009) Creat: : 0.7 (06/27/2009)   Chol: 159 (06/27/2009)   HDL: 51.00 (06/27/2009)   LDL: 84 (06/27/2009)   TG: 121.0 (06/27/2009) stable overall by  hx and exam, ok to continue meds/tx as is   Complete Medication List: 1)  Lyrica 100 Mg Caps (Pregabalin) .... Three times a day 2)  Cymbalta 30 Mg Cpep (Duloxetine hcl) .... Three times a day 3)  Concerta 36 Mg Cr-tabs (Methylphenidate hcl) .Marland Kitchen.. 1po two times a day - to fill Aug 14, 2009 4)  Gabapentin 600 Mg Tabs (Gabapentin) .... Once daily 5)  Imipramine Hcl 25 Mg Tabs (Imipramine hcl) .... As needed 6)  Prilosec 20 Mg Cpdr (Omeprazole) .... Once daily 7)  Lipitor 10 Mg Tabs (Atorvastatin calcium) .Marland Kitchen.. 1 by mouth once daily 8)  Tramadol Hcl 50 Mg Tabs (Tramadol hcl) .Marland Kitchen.. 1 tab four times a day as needed 9)  Amlodipine Besylate 5 Mg Tabs (Amlodipine  besylate) .Marland Kitchen.. 1 by mouth once daily 10)  Adult Aspirin Ec Low Strength 81 Mg Tbec (Aspirin) .Marland Kitchen.. 1 by mouth once daily 11)  Viagra 100 Mg Tabs (Sildenafil citrate) .Marland Kitchen.. 1po every other day as needed 12)  Zofran Odt 4 Mg Tbdp (Ondansetron) .Marland Kitchen.. 1 q4h as needed nausea 13)  Cialis 20 Mg Tabs (Tadalafil) .Marland Kitchen.. 1 by mouth every other day as needed 14)  Azithromycin 250 Mg Tabs (Azithromycin) .... 2po qd for 1 day, then 1po qd for 4days, then stop 15)  Hydrocodone-homatropine 5-1.5 Mg/70ml Syrp (Hydrocodone-homatropine) .Marland Kitchen.. 1 tsp by mouth q 6 hrs as needed cough  Patient Instructions: 1)  Please take all new medications as prescribed 2)  Continue all previous medications as before this visit  3)  Please schedule a follow-up appointment October 2011 for Yearly Exam Prescriptions: HYDROCODONE-HOMATROPINE 5-1.5 MG/5ML SYRP (HYDROCODONE-HOMATROPINE) 1 tsp by mouth q 6 hrs as needed cough  #6 oz x 1   Entered and Authorized by:   Corwin Levins MD   Signed by:   Corwin Levins MD on 08/22/2009   Method used:   Print then Give to Patient   RxID:   [id number] AZITHROMYCIN 250 MG TABS (AZITHROMYCIN) 2po qd for 1 day, then 1po qd for 4days, then stop  #6 x 1   Entered and Authorized by:   Corwin Levins MD   Signed by:   Corwin Levins MD on 08/22/2009    Method used:   Print then Give to Patient   RxID:   [id number]

## 2010-11-11 ENCOUNTER — Other Ambulatory Visit: Payer: Self-pay

## 2010-11-11 NOTE — Telephone Encounter (Signed)
Pt called requesting refill of Concerta, last filled 07/26/2010

## 2010-11-12 MED ORDER — METHYLPHENIDATE HCL ER (OSM) 54 MG PO TBCR: 54.0000 mg | EXTENDED_RELEASE_TABLET | Freq: Two times a day (BID) | ORAL | Status: DC

## 2010-11-12 NOTE — Telephone Encounter (Signed)
Pt advised via VM, Rx in cabinet for pt pick up 

## 2010-11-12 NOTE — Telephone Encounter (Signed)
Done hardcopy to dahlia/LIM B  

## 2010-11-14 DIAGNOSIS — IMO0001 Reserved for inherently not codable concepts without codable children: Secondary | ICD-10-CM

## 2010-11-14 HISTORY — DX: Reserved for inherently not codable concepts without codable children: IMO0001

## 2010-11-18 ENCOUNTER — Other Ambulatory Visit: Payer: Self-pay

## 2010-11-18 ENCOUNTER — Other Ambulatory Visit: Payer: Self-pay | Admitting: Internal Medicine

## 2010-11-20 ENCOUNTER — Other Ambulatory Visit (INDEPENDENT_AMBULATORY_CARE_PROVIDER_SITE_OTHER): Payer: BC Managed Care – PPO

## 2010-11-20 DIAGNOSIS — E1165 Type 2 diabetes mellitus with hyperglycemia: Secondary | ICD-10-CM

## 2010-11-20 LAB — BASIC METABOLIC PANEL
BUN: 16 mg/dL (ref 6–23)
Creatinine, Ser: 0.8 mg/dL (ref 0.4–1.5)
GFR: 107.34 mL/min (ref >60.00)
Glucose, Bld: 106 mg/dL — ABNORMAL HIGH (ref 70–99)
Potassium: 3.9 mEq/L (ref 3.5–5.1)

## 2010-11-20 LAB — LIPID PANEL
LDL Cholesterol: 68 mg/dL (ref 0–99)
VLDL: 21.6 mg/dL (ref 0.0–40.0)

## 2010-11-20 LAB — HEMOGLOBIN A1C: Hgb A1c MFr Bld: 6.9 % — ABNORMAL HIGH (ref 4.6–6.5)

## 2010-11-22 ENCOUNTER — Encounter: Payer: Self-pay | Admitting: Internal Medicine

## 2010-11-22 DIAGNOSIS — Z0000 Encounter for general adult medical examination without abnormal findings: Secondary | ICD-10-CM | POA: Insufficient documentation

## 2010-11-25 ENCOUNTER — Ambulatory Visit (INDEPENDENT_AMBULATORY_CARE_PROVIDER_SITE_OTHER): Payer: BC Managed Care – PPO | Admitting: Internal Medicine

## 2010-11-25 ENCOUNTER — Encounter: Payer: Self-pay | Admitting: Internal Medicine

## 2010-11-25 DIAGNOSIS — E119 Type 2 diabetes mellitus without complications: Secondary | ICD-10-CM

## 2010-11-25 DIAGNOSIS — I10 Essential (primary) hypertension: Secondary | ICD-10-CM

## 2010-11-25 DIAGNOSIS — R609 Edema, unspecified: Secondary | ICD-10-CM

## 2010-11-25 DIAGNOSIS — R21 Rash and other nonspecific skin eruption: Secondary | ICD-10-CM

## 2010-11-25 DIAGNOSIS — E785 Hyperlipidemia, unspecified: Secondary | ICD-10-CM

## 2010-11-25 DIAGNOSIS — M79609 Pain in unspecified limb: Secondary | ICD-10-CM

## 2010-11-25 DIAGNOSIS — Z0000 Encounter for general adult medical examination without abnormal findings: Secondary | ICD-10-CM

## 2010-11-25 DIAGNOSIS — M79673 Pain in unspecified foot: Secondary | ICD-10-CM | POA: Insufficient documentation

## 2010-11-25 MED ORDER — CLOTRIMAZOLE-BETAMETHASONE 1-0.05 % EX CREA: TOPICAL_CREAM | CUTANEOUS | Status: DC

## 2010-11-25 MED ORDER — HYDROCHLOROTHIAZIDE 25 MG PO TABS: 25.0000 mg | ORAL_TABLET | Freq: Every day | ORAL | Status: DC

## 2010-11-25 NOTE — Patient Instructions (Signed)
Take all new medications as prescribed - the increased fluid pill to 25 mg per day, and the cream as directed Continue all other medications as before You will be contacted regarding the referral for: podiatry Please return in 6 mo with Lab testing done 3-5 days before

## 2010-11-25 NOTE — Assessment & Plan Note (Signed)
stable overall by hx and exam, most recent lab reviewed with pt, and pt to continue medical treatment as before, except with trace edema will increase the HCTZ to 25 mg,  to f/u any worsening symptoms or concerns

## 2010-11-25 NOTE — Assessment & Plan Note (Signed)
Diffuse to feet with ? Fungal - for lotrisone prn, refer podiatry as he also has hx of dm, and callouses

## 2010-11-27 ENCOUNTER — Other Ambulatory Visit: Payer: Self-pay | Admitting: Internal Medicine

## 2010-11-27 ENCOUNTER — Telehealth: Payer: Self-pay

## 2010-11-27 NOTE — Telephone Encounter (Signed)
Pt called requesting an out of work note starting Monday 04/16 through this week to return to work next Monday 04/23. Okay to generate?

## 2010-11-27 NOTE — Telephone Encounter (Signed)
Ok for note for mon/tues/wed this wk, but really should be able to go back Thursday apr19  To robin  For note

## 2010-11-28 NOTE — Telephone Encounter (Signed)
Called patient informed letter completed and ready for pickup.

## 2010-12-08 ENCOUNTER — Encounter: Payer: Self-pay | Admitting: Internal Medicine

## 2010-12-08 NOTE — Assessment & Plan Note (Signed)
stable overall by hx and exam, most recent lab reviewed with pt, and pt to continue medical treatment as before  Lab Results  Component Value Date   LDLCALC 68 11/20/2010

## 2010-12-08 NOTE — Assessment & Plan Note (Signed)
Unclear etiology - for new podiatrist referral per pt request

## 2010-12-08 NOTE — Assessment & Plan Note (Signed)
stable overall by hx and exam, most recent lab reviewed with pt, and pt to continue medical treatment as before  Lab Results  Component Value Date   HGBA1C 6.9* 11/20/2010

## 2010-12-08 NOTE — Progress Notes (Signed)
Subjective:    Patient ID: Frank [name], male    DOB: 11/04/1955, 55 y.o.   MRN: [id number]  HPI  Here to f/u; overall doing ok,  Pt denies chest pain, increased sob or doe, wheezing, orthopnea, PND, increased LE swelling, palpitations, dizziness or syncope.  Pt denies new neurological symptoms such as new headache, or facial or extremity weakness or numbness   Pt denies polydipsia, polyuria, or low sugar symptoms such as weakness or confusion improved with po intake.  Pt states overall good compliance with meds, trying to follow lower cholesterol, diabetic diet, wt overall stable but little exercise however.  Does have rash adn discomfort to left foot, has seen Dr Charlsie Merles in the past, better but then worse again.   Pt denies fever, wt loss, night sweats, loss of appetite, or other constitutional symptoms  Past Medical History  Diagnosis Date  . COLONIC POLYPS 02/01/2008  . DIABETES MELLITUS, TYPE II 05/20/2010  . HYPERLIPIDEMIA 10/09/2008  . ANXIETY DEPRESSION 02/01/2008  . ERECTILE DYSFUNCTION 10/09/2008  . ADD 10/09/2008  . SLEEP APNEA, OBSTRUCTIVE 02/01/2008  . MORTON'S NEUROMA 05/20/2010  . PERIPHERAL NEUROPATHY 05/20/2010  . Other specified forms of hearing loss 06/27/2009  . HYPERTENSION 10/09/2008  . HEMORRHOIDS 02/01/2008  . ALLERGIC RHINITIS 10/09/2008  . Stricture and stenosis of esophagus 02/02/2008  . GERD 02/01/2008  . HIATAL HERNIA 02/01/2008  . ERECTILE DYSFUNCTION, ORGANIC 05/20/2010  . WRIST PAIN, LEFT 12/05/2009  . FOOT PAIN, LEFT 05/20/2010  . PERIPHERAL EDEMA 05/20/2010  . DYSPNEA 03/12/2010  . Abdominal pain, unspecified site 01/19/2009  . Abdominal pain, left lower quadrant 06/06/2010  . Type II or unspecified type diabetes mellitus without mention of complication, uncontrolled 11/14/2010   Past Surgical History  Procedure Date  . Carpal tunnel release   . Rotator cuff repair   . Esophageal dilation july 2009    reports that he has never smoked. He does not have any smokeless  tobacco history on file. He reports that he does not drink alcohol or use illicit drugs. family history includes Depression in his mother; Diabetes in his brother and mother; Heart disease in his mother; and Hyperlipidemia in his mother. Allergies  Allergen Reactions  . Codeine     REACTION: Itching   Current Outpatient Prescriptions on File Prior to Visit  Medication Sig Dispense Refill  . aspirin (ASPIRIN EC) 81 MG EC tablet Take 81 mg by mouth daily.        Marland Kitchen atorvastatin (LIPITOR) 10 MG tablet Take 10 mg by mouth daily.        . clonazePAM (KLONOPIN) 0.5 MG tablet Take 0.5 mg by mouth 2 (two) times daily as needed.        . DULoxetine (CYMBALTA) 20 MG capsule Take 20 mg by mouth 3 (three) times daily.        Marland Kitchen gabapentin (NEURONTIN) 600 MG tablet Take 600 mg by mouth daily.        Marland Kitchen imipramine (TOFRANIL) 25 MG tablet Take 25 mg by mouth daily as needed.        Marland Kitchen L-Methylfolate-B6-B12 (METANX) 3-35-2 MG TABS Take by mouth 2 (two) times daily.        . metFORMIN (GLUMETZA) 500 MG (MOD) 24 hr tablet Take 500 mg by mouth daily.        . methylphenidate (CONCERTA) 54 MG CR tablet Take 1 tablet (54 mg total) by mouth 2 (two) times daily.  60 tablet  0  . metroNIDAZOLE (FLAGYL) 250  MG tablet Take 250 mg by mouth. 1 by mouth four times per day for 10 days       . omeprazole (PRILOSEC) 20 MG capsule Take 20 mg by mouth daily.        . ondansetron (ZOFRAN-ODT) 4 MG disintegrating tablet Take 4 mg by mouth. 1 every 4 hours as needed for nausea       . pregabalin (LYRICA) 100 MG capsule Take 100 mg by mouth 3 (three) times daily.        . traMADol (ULTRAM) 50 MG tablet Take 50 mg by mouth. 1 every 4 hours as needed nausea       . traMADol (ULTRAM) 50 MG tablet Take 50 mg by mouth 4 (four) times daily as needed.        . vardenafil (LEVITRA) 20 MG tablet Take 20 mg by mouth daily as needed.        . ciprofloxacin (CIPRO) 500 MG tablet Take 500 mg by mouth 2 (two) times daily.         Review of  Systems All otherwise neg per pt     Objective:   Physical Exam BP 120/72  Pulse 102  Temp(Src) 98.5 F (36.9 C) (Oral)  Resp 16  Ht 5\' 9"  (1.753 m)  Wt 219 lb (99.338 kg)  BMI 32.34 kg/m2  SpO2 93% Physical Exam  VS noted Constitutional: Pt appears well-developed and well-nourished.  HENT: Head: Normocephalic.  Right Ear: External ear normal.  Left Ear: External ear normal.  Eyes: Conjunctivae and EOM are normal. Pupils are equal, round, and reactive to light.  Neck: Normal range of motion. Neck supple.  Cardiovascular: Normal rate and regular rhythm.   Pulmonary/Chest: Effort normal and breath sounds normal.  Abd:  Soft, NT, non-distended, + BS Neurological: Pt is alert. No cranial nerve deficit.  Skin: Skin is warm. No erythema. but left foot with diffuse scaly rash , tender ball of foot , trace ankle edema bilat Psychiatric: Pt behavior is normal. Thought content normal. 1+ nervous        Assessment & Plan:

## 2010-12-08 NOTE — Assessment & Plan Note (Signed)
Mild, to increase the hctz to 25 mg per day,  to f/u any worsening symptoms or concerns

## 2010-12-10 ENCOUNTER — Other Ambulatory Visit: Payer: Self-pay

## 2010-12-10 MED ORDER — METHYLPHENIDATE HCL ER (OSM) 54 MG PO TBCR: 54.0000 mg | EXTENDED_RELEASE_TABLET | Freq: Two times a day (BID) | ORAL | Status: DC

## 2010-12-10 NOTE — Telephone Encounter (Signed)
Pt informed, Rx in cabinet for pt pick up  

## 2010-12-18 ENCOUNTER — Other Ambulatory Visit: Payer: Self-pay | Admitting: Internal Medicine

## 2010-12-18 NOTE — Telephone Encounter (Signed)
Rx faxed to pharmacy  

## 2010-12-27 NOTE — Assessment & Plan Note (Signed)
[facility]                             PULMONARY OFFICE NOTE   RADWAN, COWLEY                     MRN:          [id number]  DATE:09/24/2006                            DOB:          02/19/1956    PULMONARY/ALLERGY FOLLOWUP   PROBLEMS:  1. Mild obstructive sleep apnea.  2. Allergic rhinitis.  3. Esophageal reflux/chronic gastritis.   HISTORY:  I had last seen this gentleman at [facility] in 2003 for mild sleep apnea.  He was tested with a nocturnal  polysomnogram in May of 2002, recording an AHI of 9 per hour.  At that  time he was complaining of fatigue, but he had a normal multiple sleep  latency test.  He is a never smoker.  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  With  that exposure he has had persistent and steadily increasing head  congestion, postnasal drip, mild shortness of breath, nasal discharge,  watery eyes.  He does have heartburn.  He tried wearing a mask to avoid  dust exposure, but it just caused his glasses to get foggy.  He asks for  allergy testing.   MEDICATIONS:  1. Prilosec 40 mg.  2. Concerta 36 mg.  3. Omeprazole 20 mg.  4. Imipramine 25 mg.  5. Lyrica 100 mg.  6. Gabapentin 400 mg.  7. Cymbalta 60 mg.  8. Caduet.   ALLERGIES:  DRUG INTOLERANT TO CODEINE WITH ITCHING.   OBJECTIVE:  Weight 201 pounds, BP 136/80, pulse regular 111, room air  saturation 97%.  He is an alert, apparently comfortable gentleman.  There is clear mucus bridging in the nose, conjunctivae are not  injected.  Pharynx is clear, no stridor.  CHEST:  Clear without cough or wheeze.  HEART:  Sound regular without murmur.  EXTREMITIES:  Without rash or edema found.   IMPRESSION:  By description he is having an allergic rhinitis related to  his occupational exposure.  We discussed emphasis on avoidance.  Job  change is not an option for him currently, but  he will try again with  the mask.   PLAN:  1. Nasal saline lavage or nasal saline gel.  2. Environmental precaution information given.  3. Sample Veramyst 1 spray in each nostril daily.  4. He will remain off of antihistamines and schedule return for      allergy skin testing.     Clinton D. Maple Hudson, MD, Tonny Bollman, FACP  Electronically Signed    CDY/MedQ  DD: 09/24/2006  DT: 09/24/2006  Job #: [id number]   cc:   Quita Skye. Artis Flock, M.D.

## 2010-12-27 NOTE — Assessment & Plan Note (Signed)
[facility]                             PULMONARY OFFICE NOTE   GERMANY, CHELF                     MRN:          [id number]  DATE:10/21/2006                            DOB:          05/30/1956    PULMONARY/ALLERGY FOLLOWUP:   PROBLEM:  1. Mild obstructive sleep apnea.  2. Allergic rhinitis.  3. Esophageal reflux/chronic gastritis.   HISTORY:  He was just here a week ago and he had called back after that  visit, asking for a leave of absence from work for March 5 through March  10.  He asks now that I check his lungs, complains of congestion in  the ears and nose, still coughing a little thick, yellow mucus, and  noting some cough and wheeze.   MEDICATIONS:  Medications are listed and reviewed with no changes.  He  is using Mucinex DM and Zyrtec p.r.n.   OBJECTIVE:  Weight 202 pounds, BP 130/84, pulse regular 99, room air  saturation 96%.  Tympanic membranes are a little retracted, without  fluid or erythema.  Frequent sniffing with no visible drainage or  secretion.  No adenopathy.  CHEST:  Clear and quiet.  No cough.  HEART  SOUNDS:  Normal.   IMPRESSION:  1. Rhinitis with exacerbation, possibly seasonal allergic rhinitis.  2. Mild obstructive apnea, previously noted.  3. Eustachian dysfunction.   PLAN:  1. Continue fluticasone nasal spray.  2. Try Allegra D 12-hour one b.i.d. p.r.n., as discussed.  3. Try Proventil 2 puffs q.i.d. p.r.n. with instruction given.  4. At his request, we re-wrote his work note for leave of absence,      March 7, to return on March 13.  5. He will keep scheduled return appointment here in early April,      earlier p.r.n.     Clinton D. Maple Hudson, MD, Tonny Bollman, FACP  Electronically Signed    CDY/MedQ  DD: 10/21/2006  DT: 10/23/2006  Job #: [id number]   cc:   Quita Skye. Artis Flock, M.D.

## 2010-12-27 NOTE — Op Note (Signed)
NAME:  Frank [name], Frank [name]                        ACCOUNT NO.:  [id number]   MEDICAL RECORD NO.:  [id number]                   PATIENT TYPE:  AMB   LOCATION:  DSC                                  FACILITY:  [facility]   PHYSICIAN:  Feliberto Gottron. Turner Daniels, M.D.                DATE OF BIRTH:  [birth year]   DATE OF PROCEDURE:  08/30/2003  DATE OF DISCHARGE:                                 OPERATIVE REPORT   PREOPERATIVE DIAGNOSIS:  Left shoulder impingement syndrome and possible  acromioclavicular joint arthritis.   POSTOPERATIVE DIAGNOSIS:  Left shoulder impingement syndrome and possible  acromioclavicular joint arthritis.   PROCEDURE:  Left shoulder arthroscopic anterior and inferior acromioplasty  and distal clavicle spur excision.   SURGEON:  Feliberto Gottron. Turner Daniels, M.D.   FIRST ASSISTANT:  None.   ANESTHETIC:  Interscalene block with general endotracheal.   ESTIMATED BLOOD LOSS:  minimal.   FLUID REPLACEMENT:  800 mL of Crystalloid.   DRAINS PLACED:  None.   TOURNIQUET TIME:  None.   INDICATIONS FOR PROCEDURE:  A 55 year old man who has had multiple cortisone  injections into both shoulders by his primary care Anaya Bovee for impingement  syndrome of left greater than right shoulder.  He has failed that plus  physical therapy plus anti-inflammatory drugs and now desires arthroscopic  evaluation and treatment of his left shoulder, probably then his right  shoulder later on for x-ray-proven type 3 subacromial spurs 8-10 mm in size  as well as a subclavicular spur.  The risks and benefits of surgery were  well understood by the patient.   DESCRIPTION OF PROCEDURE:  The patient was identified by arm band, taken to  the operating room at [facility].  Appropriate anesthetic  monitors were attached and interscalene block anesthesia induced into the  left upper extremity.  This was followed by general endotracheal anesthesia  and the patient was then placed in the beach chair position and  the left  upper extremity prepped and draped in the usual sterile fashion from the  wrist to the hemithorax.  Using a #11 blade, standard portals were then made  1.5 cm anterior to the [facility] joint, lateral to the junction, middle and  posterior third of the acromion and posterior to the posterolateral corner  of the acromion process.  Inflow was placed anteriorly, the arthroscope  laterally and a 4.2 great white sucker shaver posteriorly, allowing  subacromial bursectomy and outlining of the subclavicular and subacromial  spurs.  We also evaluated the rotator cuff externally and found no  significant tearing.   At this point a 4.5 hooded Vortex bur was brought up and used to create a  type I subacromial space with removal of the anterior-inferior acromion spur  as well as the distal clavicular spur.  We then evaluated the [facility] joint and  there was still articular cartilage present.  Cross test adduction test was  not  a major portion of his pain; therefore we did not perform a formal  distal clavicle excision.  The arthroscope was then introduced into the  glenohumeral joint using the posterior portal with the inflow attached,  where we documented intact labrum, biceps anchor, subscapularis  supraspinatus and infraspinatus tendon insertions as well as a pristine  articular cartilage of the glenohumeral joint.  The shoulder was then  irrigated out with normal saline solution and the arthroscopic instruments  removed and a dressing of Xeroform, 4x4 dressing sponges, Hypafix tape and a  sling applied.  The patient was then lowered supine, awakened and taken to  the recovery room without difficulty.                                               Feliberto Gottron. Turner Daniels, M.D.    Ovid Curd  D:  08/30/2003  T:  08/30/2003  Job:  [id number]

## 2010-12-27 NOTE — Assessment & Plan Note (Signed)
[facility]                           GASTROENTEROLOGY OFFICE NOTE   Frank [name], Frank [name]                     MRN:          [id number]  DATE:04/20/2006                            DOB:          Sep 26, 1955    The patient comes in April 20, 2006, says he is choking on food, has  some GERD, coughing a lot more spontaneous not sure whether this is sinus or  not, he never did get the barium swallow I suggested to him.   His present medications include Omeprazole 20 mg daily, Imipramine,  Concerta, Cymbalta, gabapentin.  __________  .   PHYSICAL EXAMINATION:  VITAL SIGNS:  He weighed 200, blood pressure 134/74,  pulse 80 and regular. Neck, heart and extremities are all unremarkable.   IMPRESSION:  1. Gastroesophageal reflux disease, controlled with proton pump inhibitor      for the most part.  2. Dysphagia.  3. Clinical ________  dysfunction.  4. Esophageal motility disorder.  5. Anxiety/depression.   RECOMMENDATION:  1. Get a barium swallow with a tablet.  2. Get a chest x-ray because of his cough.  3. Schedule him for an EGD with dilatation.  4. I will put him on Prevacid SoluTab to take one q.a.m.                                   Ulyess Mort, MD   SML/MedQ  DD:  04/20/2006  DT:  04/21/2006  Job #:  [id number]

## 2010-12-27 NOTE — Op Note (Signed)
NAME:  [name], Frank                        ACCOUNT NO.:  [id number]   MEDICAL RECORD NO.:  [id number]                   PATIENT TYPE:  AMB   LOCATION:  DSC                                  FACILITY:  [facility]   PHYSICIAN:  Katy Fitch. [name], M.D.          DATE OF BIRTH:  1955-09-23   DATE OF PROCEDURE:  10/13/2002  DATE OF DISCHARGE:                                 OPERATIVE REPORT   PREOPERATIVE DIAGNOSIS:  Entrapment neuropathy, median nerve, right carpal  tunnel.   POSTOPERATIVE DIAGNOSIS:  Entrapment neuropathy, median nerve, right carpal  tunnel.   PROCEDURE:  Release of right transverse carpal ligament.   SURGEON:  Katy Fitch. Sypher, M.D.   ASSISTANT:  Jonni Sanger, P.A.   ANESTHESIA:  General by LMA, supervised by the anesthesiologist, Janetta Hora.  Gelene Mink, M.D.   INDICATIONS:  The patient is a 55 year old who has a history of chronic pain  and numbness in his hands.  He had clinical findings consistent with carpal  tunnel syndrome.   Electrodiagnostic studies by Laurier Nancy, M.D., confirmed  bilateral median neuropathy, right worse than left.   After informed consent, he was brought to the operating room at this time  for release of his right transverse carpal ligament.   DESCRIPTION OF PROCEDURE:  The patient is brought to the operating room and  placed in supine position on the operating table.  Following of induction of  general anesthesia, the right arm was prepped with Betadine soap and  solution and sterilely draped.   Following exsanguination of the limb with an Esmarch bandage, an arterial  tourniquet on the proximal brachium was inflated to 220 mmHg.  The procedure  commenced with a short incision in the line of the ring finger in the palm.  The subcutaneous tissues were carefully divided, revealing the palmar  fascia.  This was split longitudinally to reveal the common sensory branch  of the median nerve.   These were followed  back to the transverse carpal ligament, which was  carefully isolated from the median nerve.   The ligament was then released along its ulnar border, extending into the  distal forearm.   This widely opened the carpal canal.  No masses or other predicaments were  noted.  The bleeding points along the margin of the released ligament were  electrocauterized with bipolar current, followed by repair of the skin with  intradermal 3-0 Prolene suture.   A compressive dressing was applied with a volar plaster splint maintaining  the wrist in 5 degrees of dorsiflexion.                                               Katy Fitch [name], M.D.    RVS/MEDQ  D:  10/13/2002  T:  10/14/2002  Job:  [id number]

## 2010-12-27 NOTE — Op Note (Signed)
NAME:  Frank [name], Frank [name]                        ACCOUNT NO.:  [id number]   MEDICAL RECORD NO.:  [id number]                   PATIENT TYPE:  AMB   LOCATION:  DSC                                  FACILITY:  [facility]   PHYSICIAN:  Feliberto Gottron. Turner Daniels, M.D.                DATE OF BIRTH:  Aug 16, 1955   DATE OF PROCEDURE:  10/16/2003  DATE OF DISCHARGE:                                 OPERATIVE REPORT   PREOPERATIVE DIAGNOSES:  Right shoulder impingement syndrome.   POSTOPERATIVE DIAGNOSES:  Right shoulder impingement syndrome.   OPERATION PERFORMED:  Right shoulder arthroscopic anterior inferior  acromioplasty, distal clavicle excision and debridement of small anterior  labral tear.   SURGEON:  Feliberto Gottron. Turner Daniels, M.D.   ASSISTANTLaural Benes. Jannet Mantis.   ANESTHESIA:  Interscalene block plus general endotracheal.   ESTIMATED BLOOD LOSS:  Minimal.   FLUIDS REPLACED:  800 mL of crystalloid.   DRAINS:  None.   TOURNIQUET TIME:  None.   INDICATIONS FOR PROCEDURE:  The patient is a 55 year old man with left  greater than right shoulder impingement syndrome.  He has done very well  after left shoulder decompression a few months ago and now desires same on  the right.  On the left side, he did not have a rotator cuff tear.  On the  right side we do not anticipate one either, but we are prepared either way.   DESCRIPTION OF PROCEDURE:  The patient was identified by arm band and taken  to the operating room at [facility] where appropriate  anesthetic monitors are attached and interscalene block anesthesia induced  into the right upper extremity followed general endotracheal anesthesia.  The patient was placed in the beach chair position.  The right upper  extremity was prepped and draped in the usual sterile fashion from the wrist  to the hemithorax.  Using a #11 blade, standard portals were made 1.5 cm  anterior to the acromioclavicular joint, lateral to the junction of the  middle and posterior thirds of the acromion and posterior to the  posterolateral corner of the acromion process.  The arthroscope was placed  in laterally, the inflow anteriorly and a 4.2 Great White sucker shaver  posteriorly allowing removal of inflamed subacromial bursa.  We then  outlined the subacromial spur and removed it with a 4.5 hooded vortex bur.  Hemostasis was maintained with an underwater Bovie electrocautery and then  at the distal clavicle spur which could be seen on the x-rays as well to  resect it, creating a flat type 1 acromion.  The rotator cuff had some very  minor tearing externally and was very lightly debrided.  The arthroscope was  then repositioned into the glenohumeral joint using the posterior portal  where we identified an intact rotator cuff, biceps tendon and anchor.  There  was a small anterior tear to the labrum which required  minimal debridement  with a 3.5 Gator sucker shaver.  At this point the shoulder was then  irrigated out with normal saline solution and then the arthroscopic  instruments were removed.  A dressing of Xeroform, 4 x 4 dressing sponges  and paper tape applied with a sling.  Patient awakened and taken to the  recovery room without difficulty.                                               Feliberto Gottron. Turner Daniels, M.D.    Ovid Curd  D:  10/16/2003  T:  10/17/2003  Job:  [id number]

## 2010-12-27 NOTE — Op Note (Signed)
   NAME:  Frank [name], Frank [name]                        ACCOUNT NO.:  [id number]   MEDICAL RECORD NO.:  [id number]                   PATIENT TYPE:  AMB   LOCATION:  DSC                                  FACILITY:  [facility]   PHYSICIAN:  Katy Fitch. [name], M.D.          DATE OF BIRTH:  [birth year]   DATE OF PROCEDURE:  11/03/2002  DATE OF DISCHARGE:                                 OPERATIVE REPORT   PREOPERATIVE DIAGNOSIS:  Entrapment neuropathy, median nerve, left carpal  tunnel.   POSTOPERATIVE DIAGNOSIS:  Entrapment neuropathy, median nerve, left carpal  tunnel.   PROCEDURE:  Release of left transverse carpal ligament.   SURGEON:  Katy Fitch. Sypher, M.D.   ASSISTANT:  Jonni Sanger, P.A.   ANESTHESIA:  General supervised by the anesthesiologist, Maren Beach,  M.D.   INDICATIONS:  The patient is a 55 year old man with a history of bilateral  carpal tunnel syndrome.  He is status post satisfactory release of his right  transverse carpal ligament and is quite satisfied with the result.  He  requested release of the left transverse carpal ligament.   After informed consent, he was brought to the operating room at this time.   DESCRIPTION OF PROCEDURE:  The patient was brought to the operating room and  placed in the supine position on the operating table.  Following induction  of general anesthesia, the left arm was prepped with Betadine soap and  solution and sterilely draped.  A pneumatic tourniquet was applied to the  proximal brachium.   Following exsanguination of the left arm with an Esmarch bandage, the  arterial tourniquet was inflated to 220 mmHg.  The procedure commenced with  a short incision in the line of the ring finger in the palm.  The  subcutaneous tissues were carefully divided to reveal the palmar fascia.  This was split longitudinally to reveal the common sensory branch of the  median nerve.   These were followed back to the transverse carpal ligament,  which was  carefully isolated from the median nerve.  The ligament was then released  along its ulnar border, extending into the distal forearm.   This widely opened the carpal canal.   No masses or other predicaments were noted.  Bleeding points along the  margin of the released ligament were electrocauterized with bipolar current,  followed by repair of the skin with intradermal 3-0 Prolene suture.   A compressive dressing was applied with a volar plaster splint maintaining  the wrist in 5 degrees of dorsiflexion.                                              Katy Fitch [name], M.D.   RVS/MEDQ  D:  11/03/2002  T:  11/04/2002  Job:  [id number]

## 2010-12-27 NOTE — Assessment & Plan Note (Signed)
[facility]                             PULMONARY OFFICE NOTE   Frank [name]                     MRN:          [id number]  DATE:10/14/2006                            DOB:          Feb 03, 1956    PROBLEMS:  1. Allergic rhinitis.  2. Esophageal reflux/chronic gastritis.  3. Mild obstructive sleep apnea.   HISTORY:  He comes today scheduled for allergy skin testing, saying that  he has had chest congestion for over a week with thick and clear sputum.  He may have had a little fever initially, unclear. There is no nasal  congestion or sneezing. No wheeze. No sore throat. Nothing purulent. 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   MEDICATIONS:  1. Prilosec 40 mg.  2. Imipramine 25 mg.  3. Lyrica 100 mg.  4. Gabapentin 400 mg 1-3 daily.  5. Concerta 36 mg b.i.d.  6. Cymbalta 60 mg.  7. Caduet.  Drug intolerant to CODEINE with itching.   Note that he had been on allergy vaccine remotely supporting an  impression of an allergic component.   His very mild sleep apnea was associated with an index of 9 per hour  when tested in May 2002. Multiple sleep latency tests did not support an  impression of significant daytime sleepiness.   OBJECTIVE:  Weight 205 pounds, temperature 98.1, blood pressure 152/84,  pulse 105, room air saturation 94%. Minimal congested cough, no  dullness, no increased work of breathing or wheeze. Pharynx is clear.  Voice quality is normal. No adenopathy. Nose is not obviously congested.  Heart sounds regular without murmur.   SKIN TEST:  Strong positive puncture reactions particularly for grass  and some weed pollens, as well as a number of tree pollens and cat.  Incidental positives for green pea and peanut. He says he eats a little  peanut once in a while, but cannot tolerate large amounts  without some  GI discomfort. He was advised to avoid foods that he does not tolerate.  We discussed his concern about house dust and I think it may be  appropriate to retest him for house dust if we begin to consider allergy  vaccine as an option. For now, he would like not to get started again on  a long term therapeutic protocol and we discussed medication options and  environmental precautions including dust avoidance and workplace  exposure issues.   PLAN:  1. Mucinex DM.  2. May try Zyrtec.  3. Doxycycline for 7 days.  4. Fluticasone nasal spray twice each nostril daily.  5. Schedule return in a month, earlier p.r.n.  6. Watch need for albuterol.     Clinton D. Maple Hudson, MD, Tonny Bollman, FACP  Electronically Signed    CDY/MedQ  DD: 10/14/2006  DT: 10/15/2006  Job #: [id number]   cc:   Quita Skye. Artis Flock, M.D.

## 2011-01-13 ENCOUNTER — Other Ambulatory Visit: Payer: Self-pay

## 2011-01-13 MED ORDER — METHYLPHENIDATE HCL ER (OSM) 54 MG PO TBCR: 54.0000 mg | EXTENDED_RELEASE_TABLET | Freq: Two times a day (BID) | ORAL | Status: DC

## 2011-01-13 NOTE — Telephone Encounter (Signed)
Pt informed, Rx in cabinet for pt pick up  

## 2011-01-13 NOTE — Telephone Encounter (Signed)
Done hardcopy to dahlia/LIM B  

## 2011-02-11 ENCOUNTER — Other Ambulatory Visit: Payer: Self-pay

## 2011-02-11 MED ORDER — METHYLPHENIDATE HCL ER (OSM) 54 MG PO TBCR: 54.0000 mg | EXTENDED_RELEASE_TABLET | Freq: Two times a day (BID) | ORAL | Status: DC

## 2011-02-11 NOTE — Telephone Encounter (Signed)
Pt informed, Rx in cabinet for pt pick up  

## 2011-03-11 ENCOUNTER — Other Ambulatory Visit: Payer: Self-pay

## 2011-03-11 MED ORDER — METHYLPHENIDATE HCL ER (OSM) 54 MG PO TBCR: 54.0000 mg | EXTENDED_RELEASE_TABLET | Freq: Two times a day (BID) | ORAL | Status: DC

## 2011-03-11 NOTE — Telephone Encounter (Signed)
Pt informed, Rx in cabinet for pt pick up  

## 2011-04-15 ENCOUNTER — Other Ambulatory Visit: Payer: Self-pay | Admitting: Internal Medicine

## 2011-04-15 NOTE — Telephone Encounter (Signed)
Patient is requesting a refill on his concerta 54 mg ER. Please advise

## 2011-04-16 NOTE — Telephone Encounter (Signed)
Faxed hardcopy to pharmacy. 

## 2011-04-17 ENCOUNTER — Telehealth: Payer: Self-pay

## 2011-04-17 MED ORDER — METHYLPHENIDATE HCL ER (OSM) 54 MG PO TBCR: 54.0000 mg | EXTENDED_RELEASE_TABLET | Freq: Two times a day (BID) | ORAL | Status: DC

## 2011-04-17 NOTE — Telephone Encounter (Signed)
Done hardcopy to dahlia/LIM B  

## 2011-04-17 NOTE — Telephone Encounter (Signed)
Patient is requesting refill on Concerta. Came by front desk requesting refill

## 2011-04-18 NOTE — Telephone Encounter (Signed)
Called patient left message that prescription requested is ready for pickup at front desk.

## 2011-05-14 ENCOUNTER — Other Ambulatory Visit: Payer: Self-pay | Admitting: Internal Medicine

## 2011-05-14 ENCOUNTER — Other Ambulatory Visit: Payer: Self-pay

## 2011-05-14 MED ORDER — METHYLPHENIDATE HCL ER (OSM) 54 MG PO TBCR: 54.0000 mg | EXTENDED_RELEASE_TABLET | Freq: Two times a day (BID) | ORAL | Status: DC

## 2011-05-14 MED ORDER — CLONAZEPAM 0.5 MG PO TABS: ORAL_TABLET | ORAL | Status: DC

## 2011-05-14 NOTE — Telephone Encounter (Signed)
Done hardcopy to robin  

## 2011-05-14 NOTE — Telephone Encounter (Signed)
Patient called requesting a refill on concerta and clonazepam

## 2011-05-15 NOTE — Telephone Encounter (Signed)
Faxed hardcopy to [facility] [address] [phone]

## 2011-05-15 NOTE — Telephone Encounter (Signed)
Faxed hardcopy to [facility] [address] [phone] and called patient and informed.

## 2011-05-23 ENCOUNTER — Encounter: Payer: BC Managed Care – PPO | Admitting: Internal Medicine

## 2011-05-27 ENCOUNTER — Encounter: Payer: BC Managed Care – PPO | Admitting: Internal Medicine

## 2011-06-05 ENCOUNTER — Other Ambulatory Visit: Payer: Self-pay | Admitting: Internal Medicine

## 2011-06-10 ENCOUNTER — Other Ambulatory Visit: Payer: Self-pay | Admitting: Internal Medicine

## 2011-06-10 MED ORDER — METHYLPHENIDATE HCL ER (OSM) 54 MG PO TBCR: 54.0000 mg | EXTENDED_RELEASE_TABLET | Freq: Two times a day (BID) | ORAL | Status: DC

## 2011-06-10 NOTE — Telephone Encounter (Signed)
Pt called and is requesting a refill on generic Concerta 54mg  called into the [facility] on [address]  Thanks!

## 2011-06-10 NOTE — Telephone Encounter (Signed)
Called left message that prescription requested is ready for pickup at front desk.

## 2011-06-12 ENCOUNTER — Other Ambulatory Visit: Payer: Self-pay

## 2011-06-12 MED ORDER — ATORVASTATIN CALCIUM 10 MG PO TABS: 10.0000 mg | ORAL_TABLET | Freq: Every day | ORAL | Status: DC

## 2011-07-14 ENCOUNTER — Other Ambulatory Visit: Payer: Self-pay

## 2011-07-14 MED ORDER — METHYLPHENIDATE HCL ER (OSM) 54 MG PO TBCR: 54.0000 mg | EXTENDED_RELEASE_TABLET | Freq: Two times a day (BID) | ORAL | Status: DC

## 2011-07-15 ENCOUNTER — Telehealth: Payer: Self-pay

## 2011-07-15 ENCOUNTER — Other Ambulatory Visit: Payer: Self-pay | Admitting: Internal Medicine

## 2011-07-15 ENCOUNTER — Other Ambulatory Visit (INDEPENDENT_AMBULATORY_CARE_PROVIDER_SITE_OTHER): Payer: BC Managed Care – PPO

## 2011-07-15 DIAGNOSIS — Z0000 Encounter for general adult medical examination without abnormal findings: Secondary | ICD-10-CM

## 2011-07-15 DIAGNOSIS — Z1289 Encounter for screening for malignant neoplasm of other sites: Secondary | ICD-10-CM

## 2011-07-15 LAB — BASIC METABOLIC PANEL
CO2: 30 mEq/L (ref 19–32)
Chloride: 101 mEq/L (ref 96–112)
Creatinine, Ser: 0.8 mg/dL (ref 0.4–1.5)
Potassium: 4.2 mEq/L (ref 3.5–5.1)
Sodium: 141 mEq/L (ref 135–145)

## 2011-07-15 LAB — CBC WITH DIFFERENTIAL/PLATELET
Basophils Absolute: 0 10*3/uL (ref 0.0–0.1)
Eosinophils Absolute: 0.1 10*3/uL (ref 0.0–0.7)
Hemoglobin: 15.3 g/dL (ref 13.0–17.0)
Lymphocytes Relative: 34.5 % (ref 12.0–46.0)
MCHC: 34.4 g/dL (ref 30.0–36.0)
Monocytes Relative: 7.6 % (ref 3.0–12.0)
Neutro Abs: 4.5 10*3/uL (ref 1.4–7.7)
Neutrophils Relative %: 56.1 % (ref 43.0–77.0)
RDW: 13.4 % (ref 11.5–14.6)

## 2011-07-15 LAB — HEPATIC FUNCTION PANEL
ALT: 29 U/L (ref 0–53)
AST: 25 U/L (ref 0–37)
Alkaline Phosphatase: 70 U/L (ref 39–117)
Bilirubin, Direct: 0.2 mg/dL (ref 0.0–0.3)
Total Bilirubin: 0.7 mg/dL (ref 0.3–1.2)
Total Protein: 7.6 g/dL (ref 6.0–8.3)

## 2011-07-15 LAB — URINALYSIS, ROUTINE W REFLEX MICROSCOPIC
Bilirubin Urine: NEGATIVE
Hgb urine dipstick: NEGATIVE
Nitrite: NEGATIVE
Total Protein, Urine: NEGATIVE
Urine Glucose: NEGATIVE
pH: 7 (ref 5.0–8.0)

## 2011-07-15 LAB — LIPID PANEL
Cholesterol: 174 mg/dL (ref 0–200)
HDL: 51 mg/dL (ref >39.00)
Total CHOL/HDL Ratio: 3
Triglycerides: 204 mg/dL — ABNORMAL HIGH (ref 0.0–149.0)

## 2011-07-15 LAB — LDL CHOLESTEROL, DIRECT: Direct LDL: 95.3 mg/dL

## 2011-07-15 NOTE — Telephone Encounter (Signed)
Put order in for physical labs. 

## 2011-07-15 NOTE — Telephone Encounter (Signed)
Called the patient left message that prescription requested is ready for pickup. 

## 2011-07-21 ENCOUNTER — Encounter: Payer: Self-pay | Admitting: Internal Medicine

## 2011-07-21 ENCOUNTER — Ambulatory Visit (INDEPENDENT_AMBULATORY_CARE_PROVIDER_SITE_OTHER): Payer: BC Managed Care – PPO | Admitting: Internal Medicine

## 2011-07-21 ENCOUNTER — Telehealth: Payer: Self-pay

## 2011-07-21 VITALS — BP 112/64 | HR 87 | Temp 98.0°F | Ht 69.0 in | Wt 215.2 lb

## 2011-07-21 DIAGNOSIS — K4090 Unilateral inguinal hernia, without obstruction or gangrene, not specified as recurrent: Secondary | ICD-10-CM

## 2011-07-21 DIAGNOSIS — K429 Umbilical hernia without obstruction or gangrene: Secondary | ICD-10-CM | POA: Insufficient documentation

## 2011-07-21 DIAGNOSIS — E119 Type 2 diabetes mellitus without complications: Secondary | ICD-10-CM

## 2011-07-21 DIAGNOSIS — Z0000 Encounter for general adult medical examination without abnormal findings: Secondary | ICD-10-CM

## 2011-07-21 NOTE — Telephone Encounter (Signed)
Initiated PA for the patients  Methylphenidate 54 mg ER Tablets. [employer] approved for 06/30/2011 through 07/20/2012, case [id number]. Called the patient informed as well as called the pharmacy informed of approval.

## 2011-07-21 NOTE — Assessment & Plan Note (Signed)
stable overall by hx and exam, most recent data reviewed with pt, and pt to continue medical treatment as before  Lab Results  Component Value Date   HGBA1C 6.9* 11/20/2010

## 2011-07-21 NOTE — Assessment & Plan Note (Signed)
Mild, for referral to Gen Surg for eval and tx

## 2011-07-21 NOTE — Assessment & Plan Note (Signed)

## 2011-07-21 NOTE — Progress Notes (Signed)
Subjective:    Patient ID: Frank [name], male    DOB: 01-22-1956, 55 y.o.   MRN: [id number]  HPI  Here for wellness and f/u;  Overall doing ok;  Pt denies CP, worsening SOB, DOE, wheezing, orthopnea, PND, worsening LE edema, palpitations, dizziness or syncope.  Pt denies neurological change such as new Headache, facial or extremity weakness.  Pt denies polydipsia, polyuria, or low sugar symptoms. Pt states overall good compliance with treatment and medications, good tolerability, and trying to follow lower cholesterol diet.  Pt denies worsening depressive symptoms, suicidal ideation or panic. No fever, wt loss, night sweats, loss of appetite, or other constitutional symptoms.  Pt states good ability with ADL's, low fall risk, home safety reviewed and adequate, no significant changes in hearing or vision, and occasionally active with exercise.  Needs PA done for the concerta that has expired.  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 COLONIC POLYPS 02/01/2008  . DIABETES MELLITUS, TYPE II 05/20/2010  . HYPERLIPIDEMIA 10/09/2008  . ANXIETY DEPRESSION 02/01/2008  . ERECTILE DYSFUNCTION 10/09/2008  . ADD 10/09/2008  . SLEEP APNEA, OBSTRUCTIVE 02/01/2008  . MORTON'S NEUROMA 05/20/2010  . PERIPHERAL NEUROPATHY 05/20/2010  . Other specified forms of hearing loss 06/27/2009  . HYPERTENSION 10/09/2008  . HEMORRHOIDS 02/01/2008  . ALLERGIC RHINITIS 10/09/2008  . Stricture and stenosis of esophagus 02/02/2008  . GERD 02/01/2008  . HIATAL HERNIA 02/01/2008  . ERECTILE DYSFUNCTION, ORGANIC 05/20/2010  . WRIST PAIN, LEFT 12/05/2009  . FOOT PAIN, LEFT 05/20/2010  . PERIPHERAL EDEMA 05/20/2010  . DYSPNEA 03/12/2010  . Abdominal pain, unspecified site 01/19/2009  . Abdominal pain, left lower quadrant 06/06/2010  . Type II or unspecified type diabetes mellitus without  mention of complication, uncontrolled 11/14/2010   Past Surgical History  Procedure Date  . Carpal tunnel release   . Rotator cuff repair   . Esophageal dilation july 2009    reports that he has never smoked. He does not have any smokeless tobacco history on file. He reports that he does not drink alcohol or use illicit drugs. family history includes Depression in his mother; Diabetes in his brother and mother; Heart disease in his mother; and Hyperlipidemia in his mother. Allergies  Allergen Reactions  . Codeine     REACTION: Itching   Current Outpatient Prescriptions on File Prior to Visit  Medication Sig Dispense Refill  . amLODipine (NORVASC) 5 MG tablet TAKE 1 TABLET BY MOUTH DAILY.  30 tablet  8  . atorvastatin (LIPITOR) 10 MG tablet Take 1 tablet (10 mg total) by mouth daily.  90 tablet  1  . clonazePAM (KLONOPIN) 0.5 MG tablet 1 tab by mouth twice per day as needed  60 tablet  3  . DULoxetine (CYMBALTA) 20 MG capsule Take 20 mg by mouth 3 (three) times daily.        Marland Kitchen gabapentin (NEURONTIN) 600 MG tablet Take 600 mg by mouth daily.        . hydrochlorothiazide 25 MG tablet Take 1 tablet (25 mg total) by mouth daily.  30 tablet  11  . L-Methylfolate-B6-B12 (METANX) 3-35-2 MG TABS Take by mouth 2 (two) times daily.        . metFORMIN (GLUCOPHAGE-XR) 500 MG 24 hr tablet TAKE 1 TABLET BY MOUTH DAILY  90 tablet  2  . methylphenidate (CONCERTA)  54 MG CR tablet Take 1 tablet (54 mg total) by mouth 2 (two) times daily.  60 tablet  0  . metroNIDAZOLE (FLAGYL) 250 MG tablet Take 250 mg by mouth. 1 by mouth four times per day for 10 days       . pregabalin (LYRICA) 100 MG capsule Take 100 mg by mouth 3 (three) times daily.        . traMADol (ULTRAM) 50 MG tablet Take 50 mg by mouth. 1 every 4 hours as needed nausea       . aspirin (ASPIRIN EC) 81 MG EC tablet Take 81 mg by mouth daily.         Review of Systems Review of Systems  Constitutional: Negative for diaphoresis, activity change,  appetite change and unexpected weight change.  HENT: Negative for hearing loss, ear pain, facial swelling, mouth sores and neck stiffness.   Eyes: Negative for pain, redness and visual disturbance.  Respiratory: Negative for shortness of breath and wheezing.   Cardiovascular: Negative for chest pain and palpitations.  Gastrointestinal: Negative for diarrhea, blood in stool, abdominal distention and rectal pain.  Genitourinary: Negative for hematuria, flank pain and decreased urine volume.  Musculoskeletal: Negative for myalgias and joint swelling.  Skin: Negative for color change and wound.  Neurological: Negative for syncope and numbness.  Hematological: Negative for adenopathy.  Psychiatric/Behavioral: Negative for hallucinations, self-injury, decreased concentration and agitation.      Objective:   Physical Exam BP 112/64  Pulse 87  Temp(Src) 98 F (36.7 C) (Oral)  Ht 5\' 9"  (1.753 m)  Wt 215 lb 4 oz (97.637 kg)  BMI 31.79 kg/m2  SpO2 91% Physical Exam  VS noted Constitutional: Pt is oriented to person, place, and time. Appears well-developed and well-nourished.  HENT:  Head: Normocephalic and atraumatic.  Right Ear: External ear normal.  Left Ear: External ear normal.  Nose: Nose normal.  Mouth/Throat: Oropharynx is clear and moist.  Eyes: Conjunctivae and EOM are normal. Pupils are equal, round, and reactive to light.  Neck: Normal range of motion. Neck supple. No JVD present. No tracheal deviation present.  Cardiovascular: Normal rate, regular rhythm, normal heart sounds and intact distal pulses.   Pulmonary/Chest: Effort normal and breath sounds normal.  Abdominal: Soft. Bowel sounds are normal. There is no tenderness.  Does have small RIH, nontender, reducible Musculoskeletal: Normal range of motion. Exhibits no edema.  Lymphadenopathy:  Has no cervical adenopathy.  Neurological: Pt is alert and oriented to person, place, and time. Pt has normal reflexes. No cranial  nerve deficit.  Skin: Skin is warm and dry. No rash noted.  Psychiatric:   Behavior is normal. 1+ nervous    Assessment & Plan:

## 2011-07-21 NOTE — Patient Instructions (Addendum)
Continue all other medications as before, including the Aspirin 81 mg per day - COATED only You are up to date with prevention today You will be contacted regarding the referral for: general surgury for the left hernia We will try to call the insurance company regarding the Prior Authorization for the generic Concerta Please return in 6 mo with Lab testing done 3-5 days before

## 2011-08-11 ENCOUNTER — Encounter: Payer: Self-pay | Admitting: Internal Medicine

## 2011-08-11 ENCOUNTER — Ambulatory Visit (INDEPENDENT_AMBULATORY_CARE_PROVIDER_SITE_OTHER): Payer: BC Managed Care – PPO | Admitting: Internal Medicine

## 2011-08-11 DIAGNOSIS — J019 Acute sinusitis, unspecified: Secondary | ICD-10-CM

## 2011-08-11 DIAGNOSIS — I10 Essential (primary) hypertension: Secondary | ICD-10-CM

## 2011-08-11 MED ORDER — PROMETHAZINE HCL 25 MG PO TABS: 25.0000 mg | ORAL_TABLET | Freq: Three times a day (TID) | ORAL | Status: AC | PRN

## 2011-08-11 MED ORDER — AMOXICILLIN-POT CLAVULANATE 875-125 MG PO TABS: 1.0000 | ORAL_TABLET | Freq: Two times a day (BID) | ORAL | Status: AC

## 2011-08-11 MED ORDER — TRAMADOL HCL 50 MG PO TABS: 50.0000 mg | ORAL_TABLET | Freq: Two times a day (BID) | ORAL | Status: AC | PRN

## 2011-08-11 NOTE — Progress Notes (Signed)
  Subjective:    Patient ID: Frank [name], male    DOB: [birth year], 55 y.o.   MRN: [id number]  HPI   HPI  C/o URI sx's x  3 days. C/o ST, cough, weakness. Not better with OTC medicines. Actually, the patient is getting worse. The patient did not sleep last night due to cough. Bad HA. Green d/c. He skipped Norvasc x 2-3 d  Review of Systems  Constitutional: Positive for fever, chills and fatigue.  HENT: Positive for congestion, rhinorrhea, sneezing and postnasal drip.   Eyes: Positive for photophobia and pain. Negative for discharge and visual disturbance.  Respiratory: Positive for cough and no wheezing.   Positive for chest pain.  Gastrointestinal: Negative for vomiting, abdominal pain, diarrhea and abdominal distention.  Genitourinary: Negative for dysuria and difficulty urinating.  Skin: Negative for rash.  Neurological: Positive for dizziness, weakness and light-headedness.   BP Readings from Last 3 Encounters:  08/11/11 170/90  07/21/11 112/64  11/25/10 120/72      Review of Systems     Objective:   Physical Exam  Constitutional: He is oriented to person, place, and time. He appears well-developed.  HENT:  Mouth/Throat: Oropharynx is clear and moist. No oropharyngeal exudate.       eryth throat  Eyes: Conjunctivae are normal. Pupils are equal, round, and reactive to light.  Neck: Normal range of motion. No JVD present. No thyromegaly present.  Cardiovascular: Normal rate, regular rhythm, normal heart sounds and intact distal pulses.  Exam reveals no gallop and no friction rub.   No murmur heard. Pulmonary/Chest: Effort normal and breath sounds normal. No respiratory distress. He has no wheezes. He has no rales. He exhibits no tenderness.  Abdominal: Soft. Bowel sounds are normal. He exhibits no distension and no mass. There is no tenderness. There is no rebound and no guarding.  Musculoskeletal: Normal range of motion. He exhibits no edema and no tenderness.    Lymphadenopathy:    He has no cervical adenopathy.  Neurological: He is alert and oriented to person, place, and time. He has normal reflexes. No cranial nerve deficit. He exhibits normal muscle tone. Coordination normal.  Skin: Skin is warm and dry. No rash noted.  Psychiatric: He has a normal mood and affect. His behavior is normal. Judgment and thought content normal.          Assessment & Plan:

## 2011-08-11 NOTE — Assessment & Plan Note (Signed)
Restart Norvasc Risks associated with treatment noncompliance were discussed. Compliance was encouraged.

## 2011-08-11 NOTE — Assessment & Plan Note (Signed)
Augmentin. Tramadol. 

## 2011-08-11 NOTE — Patient Instructions (Signed)
Use over-the-counter  "cold" medicines  such as"Afrin" nasal spray for nasal congestion as directed instead. Use" Delsym" or" Robitussin" cough syrup varietis for cough.  You can use plain "Tylenol" or "Advil' for fever, chills and achyness.   

## 2011-08-14 ENCOUNTER — Encounter: Payer: Self-pay | Admitting: Internal Medicine

## 2011-08-15 ENCOUNTER — Ambulatory Visit (INDEPENDENT_AMBULATORY_CARE_PROVIDER_SITE_OTHER): Payer: BC Managed Care – PPO | Admitting: Surgery

## 2011-08-19 ENCOUNTER — Other Ambulatory Visit: Payer: Self-pay

## 2011-08-19 MED ORDER — METHYLPHENIDATE HCL ER (OSM) 54 MG PO TBCR: 54.0000 mg | EXTENDED_RELEASE_TABLET | Freq: Two times a day (BID) | ORAL | Status: DC

## 2011-08-19 NOTE — Telephone Encounter (Signed)
Called the patient left message that prescription requested is ready for pickup. 

## 2011-08-19 NOTE — Telephone Encounter (Signed)
Done hardcopy to robin  

## 2011-09-16 ENCOUNTER — Other Ambulatory Visit: Payer: Self-pay

## 2011-09-16 MED ORDER — CLONAZEPAM 0.5 MG PO TABS: ORAL_TABLET | ORAL | Status: DC

## 2011-09-16 NOTE — Telephone Encounter (Signed)
Done hardcopy to robin  

## 2011-09-16 NOTE — Telephone Encounter (Signed)
Faxed hardcopy to pharmacy. 

## 2011-09-24 ENCOUNTER — Other Ambulatory Visit: Payer: Self-pay

## 2011-09-25 MED ORDER — METHYLPHENIDATE HCL ER (OSM) 54 MG PO TBCR: 54.0000 mg | EXTENDED_RELEASE_TABLET | Freq: Two times a day (BID) | ORAL | Status: DC

## 2011-09-25 NOTE — Telephone Encounter (Signed)
Called the patient informed to pickup prescription at front desk.

## 2011-09-25 NOTE — Telephone Encounter (Signed)
Done hardcopy to robin  

## 2011-10-26 ENCOUNTER — Encounter: Payer: Self-pay | Admitting: Internal Medicine

## 2011-10-27 ENCOUNTER — Other Ambulatory Visit: Payer: Self-pay

## 2011-10-27 MED ORDER — AMLODIPINE BESYLATE 5 MG PO TABS: 5.0000 mg | ORAL_TABLET | Freq: Every day | ORAL | Status: DC

## 2011-10-27 MED ORDER — METHYLPHENIDATE HCL ER (OSM) 54 MG PO TBCR: 54.0000 mg | EXTENDED_RELEASE_TABLET | Freq: Two times a day (BID) | ORAL | Status: DC

## 2011-10-27 NOTE — Telephone Encounter (Signed)
Called patient informed prescription is ready for pick up

## 2011-10-27 NOTE — Telephone Encounter (Signed)
amlod done escript  concerta Done hardcopy to robin

## 2011-11-11 ENCOUNTER — Other Ambulatory Visit: Payer: Self-pay | Admitting: Internal Medicine

## 2011-12-02 ENCOUNTER — Telehealth: Payer: Self-pay | Admitting: Internal Medicine

## 2011-12-02 MED ORDER — METHYLPHENIDATE HCL ER (OSM) 54 MG PO TBCR: 54.0000 mg | EXTENDED_RELEASE_TABLET | Freq: Two times a day (BID) | ORAL | Status: DC

## 2011-12-02 NOTE — Telephone Encounter (Signed)
Pt req refill for METHYLPHENIDATE 54 MG ER TAB. Last refill was 10/27/2011. Ok to refill? Please call to come back and pick this up.

## 2011-12-02 NOTE — Telephone Encounter (Signed)
Called the patient left detailed message informed prescription requested is ready for pickup.

## 2011-12-02 NOTE — Telephone Encounter (Signed)
Done hardcopy to robin  

## 2011-12-30 ENCOUNTER — Other Ambulatory Visit: Payer: Self-pay

## 2011-12-30 MED ORDER — METHYLPHENIDATE HCL ER (OSM) 54 MG PO TBCR: 54.0000 mg | EXTENDED_RELEASE_TABLET | Freq: Two times a day (BID) | ORAL | Status: DC

## 2011-12-30 NOTE — Telephone Encounter (Signed)
Called the patient left detailed message that prescription requested is ready for pickup at the front desk. 

## 2012-01-09 ENCOUNTER — Other Ambulatory Visit (INDEPENDENT_AMBULATORY_CARE_PROVIDER_SITE_OTHER): Payer: BC Managed Care – PPO

## 2012-01-09 DIAGNOSIS — E119 Type 2 diabetes mellitus without complications: Secondary | ICD-10-CM

## 2012-01-09 LAB — LIPID PANEL
LDL Cholesterol: 51 mg/dL (ref 0–99)
VLDL: 33.2 mg/dL (ref 0.0–40.0)

## 2012-01-09 LAB — HEMOGLOBIN A1C: Hgb A1c MFr Bld: 6.5 % (ref 4.6–6.5)

## 2012-01-09 LAB — BASIC METABOLIC PANEL
GFR: 93.18 mL/min (ref >60.00)
Glucose, Bld: 117 mg/dL — ABNORMAL HIGH (ref 70–99)
Potassium: 4.5 mEq/L (ref 3.5–5.1)
Sodium: 139 mEq/L (ref 135–145)

## 2012-01-12 ENCOUNTER — Other Ambulatory Visit: Payer: Self-pay | Admitting: Internal Medicine

## 2012-01-12 NOTE — Telephone Encounter (Signed)
Done hardcopy to robin  

## 2012-01-12 NOTE — Telephone Encounter (Signed)
Faxed hardcopy to pharmacy. 

## 2012-01-19 ENCOUNTER — Ambulatory Visit (INDEPENDENT_AMBULATORY_CARE_PROVIDER_SITE_OTHER): Payer: BC Managed Care – PPO | Admitting: Internal Medicine

## 2012-01-19 ENCOUNTER — Encounter: Payer: Self-pay | Admitting: Internal Medicine

## 2012-01-19 VITALS — BP 126/74 | HR 93 | Temp 98.6°F | Resp 16 | Wt 214.0 lb

## 2012-01-19 DIAGNOSIS — E119 Type 2 diabetes mellitus without complications: Secondary | ICD-10-CM

## 2012-01-19 DIAGNOSIS — Z0000 Encounter for general adult medical examination without abnormal findings: Secondary | ICD-10-CM

## 2012-01-19 DIAGNOSIS — I10 Essential (primary) hypertension: Secondary | ICD-10-CM

## 2012-01-19 DIAGNOSIS — F341 Dysthymic disorder: Secondary | ICD-10-CM

## 2012-01-19 DIAGNOSIS — E785 Hyperlipidemia, unspecified: Secondary | ICD-10-CM

## 2012-01-19 MED ORDER — ATORVASTATIN CALCIUM 10 MG PO TABS: 10.0000 mg | ORAL_TABLET | Freq: Every day | ORAL | Status: DC

## 2012-01-19 MED ORDER — METFORMIN HCL ER 500 MG PO TB24: 500.0000 mg | ORAL_TABLET | Freq: Every day | ORAL | Status: DC

## 2012-01-19 NOTE — Progress Notes (Signed)
Subjective:    Patient ID: Frank [name], male    DOB: 27-Apr-1956, 56 y.o.   MRN: [id number]  HPI  Here to f/u; overall doing ok,  Pt denies chest pain, increased sob or doe, wheezing, orthopnea, PND, increased LE swelling, palpitations, dizziness or syncope.  Pt denies new neurological symptoms such as new headache, or facial or extremity weakness or numbness   Pt denies polydipsia, polyuria, or low sugar symptoms such as weakness or confusion improved with po intake.  Pt states overall good compliance with meds, trying to follow lower cholesterol, diabetic diet, wt overall stable but little exercise however.  Denies worsening depressive symptoms, suicidal ideation, or panic, though has ongoing anxiety, not increased recently.   No acute complaints Past Medical History  Diagnosis Date  . COLONIC POLYPS 02/01/2008  . DIABETES MELLITUS, TYPE II 05/20/2010  . HYPERLIPIDEMIA 10/09/2008  . ANXIETY DEPRESSION 02/01/2008  . ERECTILE DYSFUNCTION 10/09/2008  . ADD 10/09/2008  . SLEEP APNEA, OBSTRUCTIVE 02/01/2008  . MORTON'S NEUROMA 05/20/2010  . PERIPHERAL NEUROPATHY 05/20/2010  . Other specified forms of hearing loss 06/27/2009  . HYPERTENSION 10/09/2008  . HEMORRHOIDS 02/01/2008  . ALLERGIC RHINITIS 10/09/2008  . Stricture and stenosis of esophagus 02/02/2008  . GERD 02/01/2008  . HIATAL HERNIA 02/01/2008  . ERECTILE DYSFUNCTION, ORGANIC 05/20/2010  . WRIST PAIN, LEFT 12/05/2009  . FOOT PAIN, LEFT 05/20/2010  . PERIPHERAL EDEMA 05/20/2010  . DYSPNEA 03/12/2010  . Abdominal pain, unspecified site 01/19/2009  . Abdominal pain, left lower quadrant 06/06/2010  . Type II or unspecified type diabetes mellitus without mention of complication, uncontrolled 11/14/2010   Past Surgical History  Procedure Date  . Carpal tunnel release   . Rotator cuff repair   . Esophageal dilation july 2009    reports that he has never smoked. He does not have any smokeless tobacco history on file. He reports that he does not drink  alcohol or use illicit drugs. family history includes Depression in his mother; Diabetes in his brother and mother; Heart disease in his mother; and Hyperlipidemia in his mother. Allergies  Allergen Reactions  . Codeine     REACTION: Itching   Current Outpatient Prescriptions on File Prior to Visit  Medication Sig Dispense Refill  . amLODipine (NORVASC) 5 MG tablet Take 1 tablet (5 mg total) by mouth daily.  90 tablet  2  . aspirin (ASPIRIN EC) 81 MG EC tablet Take 81 mg by mouth daily.        . clonazePAM (KLONOPIN) 0.5 MG tablet TAKE 1 TABLET BY MOUTH TWICE DAILY AS NEEDED  60 tablet  3  . DULoxetine (CYMBALTA) 20 MG capsule Take 20 mg by mouth 3 (three) times daily.        Marland Kitchen gabapentin (NEURONTIN) 600 MG tablet Take 600 mg by mouth daily.        . hydrochlorothiazide (HYDRODIURIL) 25 MG tablet TAKE 1 TABLET BY MOUTH DAILY  30 tablet  10  . L-Methylfolate-B6-B12 (METANX) 3-35-2 MG TABS Take by mouth 2 (two) times daily.        . methylphenidate (CONCERTA) 54 MG CR tablet Take 1 tablet (54 mg total) by mouth 2 (two) times daily.  60 tablet  0  . metroNIDAZOLE (FLAGYL) 250 MG tablet Take 250 mg by mouth. 1 by mouth four times per day for 10 days       . pregabalin (LYRICA) 100 MG capsule Take 100 mg by mouth 3 (three) times daily.        Marland Kitchen  DISCONTD: atorvastatin (LIPITOR) 10 MG tablet Take 1 tablet (10 mg total) by mouth daily.  90 tablet  1  . DISCONTD: metFORMIN (GLUCOPHAGE-XR) 500 MG 24 hr tablet TAKE 1 TABLET BY MOUTH DAILY  90 tablet  2   Review of Systems Constitutional: Negative for diaphoresis and unexpected weight change.  HENT: Negative for drooling and tinnitus.   Eyes: Negative for photophobia and visual disturbance.  Respiratory: Negative for choking and stridor.   Gastrointestinal: Negative for vomiting and blood in stool.  Genitourinary: Negative for hematuria and decreased urine volume.  Musculoskeletal: Negative for gait problem.  Neurological: Negative for tremors and  numbness.  Psychiatric/Behavioral: Negative for decreased concentration. The patient is not hyperactive.      Objective:   Physical Exam BP 126/74  Pulse 93  Temp(Src) 98.6 F (37 C) (Oral)  Resp 16  Wt 214 lb (97.07 kg)  SpO2 93% Physical Exam  VS noted, not ill appearing Constitutional: Pt appears well-developed and well-nourished.  HENT: Head: Normocephalic.  Right Ear: External ear normal.  Left Ear: External ear normal.  Eyes: Conjunctivae and EOM are normal. Pupils are equal, round, and reactive to light.  Neck: Normal range of motion. Neck supple.  Cardiovascular: Normal rate and regular rhythm.   Pulmonary/Chest: Effort normal and breath sounds normal.  Neurological: Pt is alert. Not confused, motor/gait intact Skin: Skin is warm. No erythema.  Psychiatric: Pt behavior is normal. Thought content normal. Not depressed affect, not overly nervous    Assessment & Plan:

## 2012-01-19 NOTE — Assessment & Plan Note (Signed)
stable overall by hx and exam, most recent data reviewed with pt, and pt to continue medical treatment as before Lab Results  Component Value Date   HGBA1C 6.5 01/09/2012    

## 2012-01-19 NOTE — Assessment & Plan Note (Signed)
stable overall by hx and exam, most recent data reviewed with pt, and pt to continue medical treatment as before Lab Results  Component Value Date   WBC 8.1 07/15/2011   HGB 15.3 07/15/2011   HCT 44.5 07/15/2011   PLT 235.0 07/15/2011   GLUCOSE 117* 01/09/2012   CHOL 142 01/09/2012   TRIG 166.0* 01/09/2012   HDL 58.10 01/09/2012   LDLDIRECT 95.3 07/15/2011   LDLCALC 51 01/09/2012   ALT 29 07/15/2011   AST 25 07/15/2011   NA 139 01/09/2012   K 4.5 01/09/2012   CL 95* 01/09/2012   CREATININE 0.9 01/09/2012   BUN 11 01/09/2012   CO2 34* 01/09/2012   TSH 1.65 07/15/2011   PSA 0.22 07/15/2011   HGBA1C 6.5 01/09/2012

## 2012-01-19 NOTE — Assessment & Plan Note (Signed)
stable overall by hx and exam, most recent data reviewed with pt, and pt to continue medical treatment as before BP Readings from Last 3 Encounters:  01/19/12 126/74  08/11/11 170/90  07/21/11 112/64

## 2012-01-19 NOTE — Patient Instructions (Signed)
Continue all other medications as before No changes are needed today Please continue your efforts at being more active, low cholesterol diet, and weight control. Please return in 6 mo with Lab testing done 3-5 days before

## 2012-01-19 NOTE — Assessment & Plan Note (Signed)
stable overall by hx and exam, most recent data reviewed with pt, and pt to continue medical treatment as before Lab Results  Component Value Date   LDLCALC 51 01/09/2012

## 2012-01-27 ENCOUNTER — Other Ambulatory Visit: Payer: Self-pay

## 2012-01-27 MED ORDER — METHYLPHENIDATE HCL ER (OSM) 54 MG PO TBCR: 54.0000 mg | EXTENDED_RELEASE_TABLET | Freq: Two times a day (BID) | ORAL | Status: DC

## 2012-01-27 NOTE — Telephone Encounter (Signed)
Done hardcopy to robin  

## 2012-01-28 NOTE — Telephone Encounter (Signed)
Called patient informed prescription is ready for pick up at the front desk.

## 2012-02-10 ENCOUNTER — Other Ambulatory Visit: Payer: Self-pay | Admitting: Internal Medicine

## 2012-02-23 ENCOUNTER — Other Ambulatory Visit: Payer: Self-pay

## 2012-02-23 MED ORDER — METHYLPHENIDATE HCL ER (OSM) 54 MG PO TBCR: 54.0000 mg | EXTENDED_RELEASE_TABLET | Freq: Two times a day (BID) | ORAL | Status: DC

## 2012-02-23 NOTE — Telephone Encounter (Signed)
Called the patient left detailed message that prescription requested is ready for pickup at the front desk. 

## 2012-02-23 NOTE — Telephone Encounter (Signed)
Done hardcopy to robin  

## 2012-03-29 ENCOUNTER — Other Ambulatory Visit: Payer: Self-pay

## 2012-03-29 MED ORDER — METHYLPHENIDATE HCL ER (OSM) 54 MG PO TBCR: EXTENDED_RELEASE_TABLET | ORAL | Status: DC

## 2012-03-29 NOTE — Telephone Encounter (Signed)
Done hardcopy to robin  

## 2012-03-29 NOTE — Telephone Encounter (Signed)
Called the patient (left detailed message) informed hardcopy of prescription he requested is ready for pickup at the front desk.

## 2012-04-28 ENCOUNTER — Telehealth: Payer: Self-pay | Admitting: Internal Medicine

## 2012-04-28 MED ORDER — METHYLPHENIDATE HCL ER (OSM) 54 MG PO TBCR: EXTENDED_RELEASE_TABLET | ORAL | Status: DC

## 2012-04-28 NOTE — Telephone Encounter (Signed)
Patient needs a refill on Concerta, call when ready to pick up

## 2012-04-28 NOTE — Telephone Encounter (Signed)
Done hardcopy to robin  

## 2012-04-28 NOTE — Telephone Encounter (Signed)
Called the patient left detailed message that prescription requested is ready for pickup at the front desk. 

## 2012-05-03 ENCOUNTER — Telehealth: Payer: Self-pay

## 2012-05-03 MED ORDER — METHYLPHENIDATE HCL ER (OSM) 54 MG PO TBCR: EXTENDED_RELEASE_TABLET | ORAL | Status: DC

## 2012-05-03 NOTE — Telephone Encounter (Signed)
rx corrected - Done hardcopy to robin  

## 2012-05-03 NOTE — Telephone Encounter (Signed)
Patient called informed the pharmacy would not fill his recent concerta rx as was written incorrectly, please adivse

## 2012-05-04 NOTE — Telephone Encounter (Signed)
Patient informed prescription hardcopy is ready for pickup at the front desk. 

## 2012-05-17 ENCOUNTER — Other Ambulatory Visit: Payer: Self-pay | Admitting: Internal Medicine

## 2012-05-17 NOTE — Telephone Encounter (Signed)
Faxed hardcopy to pharmacy. 

## 2012-05-17 NOTE — Telephone Encounter (Signed)
Done hardcopy to robin  

## 2012-06-01 ENCOUNTER — Other Ambulatory Visit: Payer: Self-pay

## 2012-06-01 MED ORDER — METHYLPHENIDATE HCL ER (OSM) 54 MG PO TBCR: EXTENDED_RELEASE_TABLET | ORAL | Status: DC

## 2012-06-01 NOTE — Telephone Encounter (Signed)
Called the patient left detailed message that prescription requested is ready for pickup at the front desk. 

## 2012-06-01 NOTE — Telephone Encounter (Signed)
Done hardcopy to robin  

## 2012-06-04 ENCOUNTER — Encounter: Payer: Self-pay | Admitting: Internal Medicine

## 2012-06-04 ENCOUNTER — Ambulatory Visit (INDEPENDENT_AMBULATORY_CARE_PROVIDER_SITE_OTHER): Payer: BC Managed Care – PPO | Admitting: Internal Medicine

## 2012-06-04 VITALS — BP 122/72 | HR 110 | Temp 98.9°F | Ht 69.0 in | Wt 211.2 lb

## 2012-06-04 DIAGNOSIS — K4090 Unilateral inguinal hernia, without obstruction or gangrene, not specified as recurrent: Secondary | ICD-10-CM

## 2012-06-04 DIAGNOSIS — I10 Essential (primary) hypertension: Secondary | ICD-10-CM

## 2012-06-04 DIAGNOSIS — J019 Acute sinusitis, unspecified: Secondary | ICD-10-CM

## 2012-06-04 DIAGNOSIS — E119 Type 2 diabetes mellitus without complications: Secondary | ICD-10-CM

## 2012-06-04 DIAGNOSIS — Z23 Encounter for immunization: Secondary | ICD-10-CM

## 2012-06-04 MED ORDER — AZITHROMYCIN 250 MG PO TABS: ORAL_TABLET | ORAL | Status: DC

## 2012-06-04 MED ORDER — GLUCOSE BLOOD VI STRP: ORAL_STRIP | Status: DC

## 2012-06-04 NOTE — Patient Instructions (Addendum)
You had the flu shot today Take all new medications as prescribed Continue all other medications as before You are given the work note today You will be contacted regarding the referral for: general surgury (Dr Abbey Chatters or Gerrit Friends)

## 2012-06-05 ENCOUNTER — Encounter: Payer: Self-pay | Admitting: Internal Medicine

## 2012-06-05 DIAGNOSIS — J019 Acute sinusitis, unspecified: Secondary | ICD-10-CM | POA: Insufficient documentation

## 2012-06-05 NOTE — Assessment & Plan Note (Signed)
Mild to mod, for antibx course,  to f/u any worsening symptoms or concerns 

## 2012-06-05 NOTE — Progress Notes (Signed)
Subjective:    Patient ID: Frank [name], male    DOB: 17-Feb-1956, 56 y.o.   MRN: [id number]  HPI    Here with 3 days acute onset fever, facial pain, pressure, general weakness and malaise, and greenish d/c, with slight ST, but little to no cough and Pt denies chest pain, increased sob or doe, wheezing, orthopnea, PND, increased LE swelling, palpitations, dizziness or syncope.  Pt denies new neurological symptoms such as new headache, or facial or extremity weakness or numbness   Pt denies polydipsia, polyuria, or low sugar symptoms such as weakness or confusion improved with po intake.  Pt states overall good compliance with meds, trying to follow lower cholesterol, diabetic diet, wt overall stable but little exercise however.   For flu shot today.  Also has left inguinal hernia persisted, reducible, now wanting surgury. Past Medical History  Diagnosis Date  . COLONIC POLYPS 02/01/2008  . DIABETES MELLITUS, TYPE II 05/20/2010  . HYPERLIPIDEMIA 10/09/2008  . ANXIETY DEPRESSION 02/01/2008  . ERECTILE DYSFUNCTION 10/09/2008  . ADD 10/09/2008  . SLEEP APNEA, OBSTRUCTIVE 02/01/2008  . MORTON'S NEUROMA 05/20/2010  . PERIPHERAL NEUROPATHY 05/20/2010  . Other specified forms of hearing loss 06/27/2009  . HYPERTENSION 10/09/2008  . HEMORRHOIDS 02/01/2008  . ALLERGIC RHINITIS 10/09/2008  . Stricture and stenosis of esophagus 02/02/2008  . GERD 02/01/2008  . HIATAL HERNIA 02/01/2008  . ERECTILE DYSFUNCTION, ORGANIC 05/20/2010  . WRIST PAIN, LEFT 12/05/2009  . FOOT PAIN, LEFT 05/20/2010  . PERIPHERAL EDEMA 05/20/2010  . DYSPNEA 03/12/2010  . Abdominal pain, unspecified site 01/19/2009  . Abdominal pain, left lower quadrant 06/06/2010  . Type II or unspecified type diabetes mellitus without mention of complication, uncontrolled 11/14/2010   Past Surgical History  Procedure Date  . Carpal tunnel release   . Rotator cuff repair   . Esophageal dilation july 2009    reports that he has never smoked. He does not  have any smokeless tobacco history on file. He reports that he does not drink alcohol or use illicit drugs. family history includes Depression in his mother; Diabetes in his brother and mother; Heart disease in his mother; and Hyperlipidemia in his mother. Allergies  Allergen Reactions  . Codeine     REACTION: Itching   Current Outpatient Prescriptions on File Prior to Visit  Medication Sig Dispense Refill  . amLODipine (NORVASC) 5 MG tablet Take 1 tablet (5 mg total) by mouth daily.  90 tablet  2  . aspirin (ASPIRIN EC) 81 MG EC tablet Take 81 mg by mouth daily.        Marland Kitchen atorvastatin (LIPITOR) 10 MG tablet Take 1 tablet (10 mg total) by mouth daily.  90 tablet  3  . clonazePAM (KLONOPIN) 0.5 MG tablet TAKE 1 TABLET BY MOUTH TWICE DAILY AS NEEDED  60 tablet  2  . DULoxetine (CYMBALTA) 20 MG capsule Take 20 mg by mouth 3 (three) times daily.        Marland Kitchen gabapentin (NEURONTIN) 600 MG tablet Take 600 mg by mouth daily.        . hydrochlorothiazide (HYDRODIURIL) 25 MG tablet TAKE 1 TABLET BY MOUTH DAILY  30 tablet  10  . L-Methylfolate-B6-B12 (METANX) 3-35-2 MG TABS Take by mouth 2 (two) times daily.        . metFORMIN (GLUCOPHAGE-XR) 500 MG 24 hr tablet Take 1 tablet (500 mg total) by mouth daily with breakfast.  90 tablet  3  . methylphenidate (CONCERTA) 54 MG CR tablet 1 tab  by mouth twice per day  60 tablet  0  . metroNIDAZOLE (FLAGYL) 250 MG tablet Take 250 mg by mouth. 1 by mouth four times per day for 10 days       . pregabalin (LYRICA) 100 MG capsule Take 100 mg by mouth 3 (three) times daily.         Review of Systems  Constitutional: Negative for diaphoresis and unexpected weight change.  HENT: Negative for tinnitus.   Eyes: Negative for photophobia and visual disturbance.  Respiratory: Negative for choking and stridor.   Gastrointestinal: Negative for vomiting and blood in stool.  Genitourinary: Negative for hematuria and decreased urine volume.  Musculoskeletal: Negative for gait  problem.  Skin: Negative for color change and wound.  Neurological: Negative for tremors and numbness.  Psychiatric/Behavioral: Negative for decreased concentration. The patient is not hyperactive.        Objective:   Physical Exam BP 122/72  Pulse 110  Temp 98.9 F (37.2 C) (Oral)  Ht 5\' 9"  (1.753 m)  Wt 211 lb 4 oz (95.822 kg)  BMI 31.20 kg/m2  SpO2 95% Physical Exam  VS noted Constitutional: Pt appears well-developed and well-nourished.  HENT: Head: Normocephalic.  Right Ear: External ear normal.  Left Ear: External ear normal.  Bilat tm's mild erythema.  Sinus nontender.  Pharynx mild erythema Eyes: Conjunctivae and EOM are normal. Pupils are equal, round, and reactive to light.  Neck: Normal range of motion. Neck supple.  Cardiovascular: Normal rate and regular rhythm.   Pulmonary/Chest: Effort normal and breath sounds normal.  Abd:  Soft, NT, non-distended, + BS Neurological: Pt is alert. Not confused  Skin: Skin is warm. No erythema.  LIH nontender, reducible Psychiatric: Pt behavior is normal. Thought content normal.     Assessment & Plan:

## 2012-06-05 NOTE — Assessment & Plan Note (Signed)
Mild tender, reducible, for gen surgury referrral

## 2012-06-05 NOTE — Assessment & Plan Note (Signed)
stable overall by hx and exam, most recent data reviewed with pt, and pt to continue medical treatment as before Lab Results  Component Value Date   HGBA1C 6.5 01/09/2012

## 2012-06-05 NOTE — Assessment & Plan Note (Signed)
stable overall by hx and exam, most recent data reviewed with pt, and pt to continue medical treatment as before BP Readings from Last 3 Encounters:  06/04/12 122/72  01/19/12 126/74  08/11/11 170/90

## 2012-06-28 ENCOUNTER — Other Ambulatory Visit: Payer: Self-pay | Admitting: *Deleted

## 2012-06-28 ENCOUNTER — Ambulatory Visit (INDEPENDENT_AMBULATORY_CARE_PROVIDER_SITE_OTHER): Payer: BC Managed Care – PPO | Admitting: General Surgery

## 2012-06-28 MED ORDER — METHYLPHENIDATE HCL ER (OSM) 54 MG PO TBCR: EXTENDED_RELEASE_TABLET | ORAL | Status: DC

## 2012-06-28 NOTE — Telephone Encounter (Signed)
Pt requesting refill of Concerta-last written 06/01/2012 #60 with 0 refills-please advise.

## 2012-06-28 NOTE — Telephone Encounter (Signed)
Patient informed to pickup hardcopy at the front desk. 

## 2012-06-28 NOTE — Telephone Encounter (Signed)
Done hardcopy to robin  

## 2012-07-15 ENCOUNTER — Telehealth: Payer: Self-pay

## 2012-07-15 NOTE — Telephone Encounter (Signed)
PA approval For Methylphenidate ER 54 mg.  Called [phone] medication was approved from 06-24-12 through  07-15-13.  Case [id number].  Received fax letter and patient has been informed as well.

## 2012-07-16 ENCOUNTER — Other Ambulatory Visit (INDEPENDENT_AMBULATORY_CARE_PROVIDER_SITE_OTHER): Payer: BC Managed Care – PPO

## 2012-07-16 DIAGNOSIS — Z0000 Encounter for general adult medical examination without abnormal findings: Secondary | ICD-10-CM

## 2012-07-16 DIAGNOSIS — E119 Type 2 diabetes mellitus without complications: Secondary | ICD-10-CM

## 2012-07-16 LAB — CBC WITH DIFFERENTIAL/PLATELET
Basophils Relative: 0.6 % (ref 0.0–3.0)
Eosinophils Relative: 1.3 % (ref 0.0–5.0)
Hemoglobin: 14.3 g/dL (ref 13.0–17.0)
Lymphocytes Relative: 31.8 % (ref 12.0–46.0)
MCHC: 33.4 g/dL (ref 30.0–36.0)
Monocytes Relative: 8 % (ref 3.0–12.0)
Neutro Abs: 3.8 10*3/uL (ref 1.4–7.7)
Neutrophils Relative %: 58.3 % (ref 43.0–77.0)
RBC: 4.56 Mil/uL (ref 4.22–5.81)
WBC: 6.5 10*3/uL (ref 4.5–10.5)

## 2012-07-16 LAB — URINALYSIS, ROUTINE W REFLEX MICROSCOPIC
Hgb urine dipstick: NEGATIVE
Nitrite: NEGATIVE
Specific Gravity, Urine: 1.025 (ref 1.000–1.030)
Total Protein, Urine: NEGATIVE
Urine Glucose: NEGATIVE
pH: 6 (ref 5.0–8.0)

## 2012-07-16 LAB — HEPATIC FUNCTION PANEL
ALT: 24 U/L (ref 0–53)
Albumin: 4.1 g/dL (ref 3.5–5.2)
Bilirubin, Direct: 0.2 mg/dL (ref 0.0–0.3)
Total Protein: 6.8 g/dL (ref 6.0–8.3)

## 2012-07-16 LAB — LIPID PANEL
Cholesterol: 159 mg/dL (ref 0–200)
HDL: 54.8 mg/dL (ref >39.00)
LDL Cholesterol: 84 mg/dL (ref 0–99)
Total CHOL/HDL Ratio: 3
Triglycerides: 99 mg/dL (ref 0.0–149.0)

## 2012-07-16 LAB — BASIC METABOLIC PANEL
BUN: 11 mg/dL (ref 6–23)
CO2: 32 mEq/L (ref 19–32)
Calcium: 9.2 mg/dL (ref 8.4–10.5)
Creatinine, Ser: 0.8 mg/dL (ref 0.4–1.5)

## 2012-07-16 LAB — MICROALBUMIN / CREATININE URINE RATIO: Creatinine,U: 99 mg/dL

## 2012-07-16 LAB — PSA: PSA: 0.22 ng/mL (ref 0.10–4.00)

## 2012-07-20 ENCOUNTER — Ambulatory Visit: Payer: BC Managed Care – PPO | Admitting: Internal Medicine

## 2012-07-20 ENCOUNTER — Ambulatory Visit (INDEPENDENT_AMBULATORY_CARE_PROVIDER_SITE_OTHER): Payer: Self-pay | Admitting: General Surgery

## 2012-07-30 ENCOUNTER — Ambulatory Visit: Payer: BC Managed Care – PPO | Admitting: Internal Medicine

## 2012-07-30 DIAGNOSIS — Z0289 Encounter for other administrative examinations: Secondary | ICD-10-CM

## 2012-08-09 ENCOUNTER — Encounter (INDEPENDENT_AMBULATORY_CARE_PROVIDER_SITE_OTHER): Payer: Self-pay | Admitting: General Surgery

## 2012-08-09 ENCOUNTER — Ambulatory Visit (INDEPENDENT_AMBULATORY_CARE_PROVIDER_SITE_OTHER): Payer: BC Managed Care – PPO | Admitting: General Surgery

## 2012-08-09 ENCOUNTER — Other Ambulatory Visit: Payer: Self-pay

## 2012-08-09 VITALS — BP 136/84 | HR 88 | Temp 97.9°F | Resp 14 | Ht 69.0 in | Wt 208.2 lb

## 2012-08-09 DIAGNOSIS — K429 Umbilical hernia without obstruction or gangrene: Secondary | ICD-10-CM

## 2012-08-09 MED ORDER — METHYLPHENIDATE HCL ER (OSM) 54 MG PO TBCR: EXTENDED_RELEASE_TABLET | ORAL | Status: DC

## 2012-08-09 NOTE — Patient Instructions (Signed)
If you notice a persistent bulge in your groin area, please call us back.

## 2012-08-09 NOTE — Telephone Encounter (Signed)
Patient informed to pickup hardcopy at the front desk. At convenience.

## 2012-08-09 NOTE — Progress Notes (Signed)
Patient ID: Frank [name], male   DOB: [birth year], 56 y.o.   MRN: [id number]  No chief complaint on file.   HPI Frank [name] is a 56 y.o. male.   HPI  He is referred by Dr. Jonny Ruiz for evaluation of a possible left inguinal hernia. He has felt a fullness and thickness in the left inguinal area. He does have some intermittent left lower quadrant abdominal pain as well.  He has not noticed a bulge. There is a family history of hernia. He does not have to strain to urinate.  Past Medical History  Diagnosis Date  . COLONIC POLYPS 02/01/2008  . DIABETES MELLITUS, TYPE II 05/20/2010  . HYPERLIPIDEMIA 10/09/2008  . ANXIETY DEPRESSION 02/01/2008  . ERECTILE DYSFUNCTION 10/09/2008  . ADD 10/09/2008  . SLEEP APNEA, OBSTRUCTIVE 02/01/2008  . MORTON'S NEUROMA 05/20/2010  . PERIPHERAL NEUROPATHY 05/20/2010  . Other specified forms of hearing loss 06/27/2009  . HYPERTENSION 10/09/2008  . HEMORRHOIDS 02/01/2008  . ALLERGIC RHINITIS 10/09/2008  . Stricture and stenosis of esophagus 02/02/2008  . GERD 02/01/2008  . HIATAL HERNIA 02/01/2008  . ERECTILE DYSFUNCTION, ORGANIC 05/20/2010  . WRIST PAIN, LEFT 12/05/2009  . FOOT PAIN, LEFT 05/20/2010  . PERIPHERAL EDEMA 05/20/2010  . DYSPNEA 03/12/2010  . Abdominal pain, unspecified site 01/19/2009  . Abdominal pain, left lower quadrant 06/06/2010  . Type II or unspecified type diabetes mellitus without mention of complication, uncontrolled 11/14/2010    Past Surgical History  Procedure Date  . Carpal tunnel release   . Rotator cuff repair   . Esophageal dilation july 2009    Family History  Problem Relation Age of Onset  . Diabetes Mother   . Heart disease Mother   . Hyperlipidemia Mother   . Depression Mother   . Diabetes Brother     Social History History  Substance Use Topics  . Smoking status: Never Smoker   . Smokeless tobacco: Not on file  . Alcohol Use: No     Comment: moderately    Allergies  Allergen Reactions  . Codeine     REACTION:  Itching    Current Outpatient Prescriptions  Medication Sig Dispense Refill  . amLODipine (NORVASC) 5 MG tablet Take 1 tablet (5 mg total) by mouth daily.  90 tablet  2  . aspirin (ASPIRIN EC) 81 MG EC tablet Take 81 mg by mouth daily.        Marland Kitchen atorvastatin (LIPITOR) 10 MG tablet Take 1 tablet (10 mg total) by mouth daily.  90 tablet  3  . clonazePAM (KLONOPIN) 0.5 MG tablet TAKE 1 TABLET BY MOUTH TWICE DAILY AS NEEDED  60 tablet  2  . DULoxetine (CYMBALTA) 20 MG capsule Take 20 mg by mouth 3 (three) times daily.        Marland Kitchen gabapentin (NEURONTIN) 600 MG tablet Take 600 mg by mouth daily.        Marland Kitchen glucose blood (FREESTYLE TEST STRIPS) test strip Use as instructed  100 each  12  . hydrochlorothiazide (HYDRODIURIL) 25 MG tablet TAKE 1 TABLET BY MOUTH DAILY  30 tablet  10  . metFORMIN (GLUCOPHAGE-XR) 500 MG 24 hr tablet Take 1 tablet (500 mg total) by mouth daily with breakfast.  90 tablet  3  . pregabalin (LYRICA) 100 MG capsule Take 100 mg by mouth 3 (three) times daily.        Marland Kitchen azithromycin (ZITHROMAX Z-PAK) 250 MG tablet Use as directed  6 each  1  . L-Methylfolate-B6-B12 [facility])  3-35-2 MG TABS Take by mouth 2 (two) times daily.        . methylphenidate (CONCERTA) 54 MG CR tablet 1 tab by mouth twice per day, to fill Jul 01, 2012  60 tablet  0  . metroNIDAZOLE (FLAGYL) 250 MG tablet Take 250 mg by mouth. 1 by mouth four times per day for 10 days         Review of Systems Review of Systems  HENT: Positive for trouble swallowing.   Respiratory: Negative.   Cardiovascular: Negative.   Gastrointestinal: Positive for abdominal pain (LLQ-intermittent.).  Genitourinary: Negative for dysuria.    Blood pressure 136/84, pulse 88, temperature 97.9 F (36.6 C), resp. rate 14, height 5\' 9"  (1.753 m), weight 208 lb 3.2 oz (94.439 kg).  Physical Exam Physical Exam  Constitutional: No distress.       Overweight male.    Abdominal: Soft. He exhibits no mass. There is no tenderness.       Very  small, reducible umbilical hernia  Genitourinary:       Right testicle is slightly larger than left testicle. No palpable masses. There is no palpable inguinal hernia on the left side or the right side. There is some thickening of the subcutaneous tissue on the left side.    Data Reviewed Notes in EPIC  Assessment    Subcutaneous thickening in the left inguinal area but no evidence of hernia on today's exam. Small asymptomatic umbilical hernia that I do not feel needs repair at this time.    Plan    Return if the umbilical hernia gets larger or become symptomatic. I also told to call if he notes a persistent bulge in the left groin area.       Vanassa Penniman J 08/09/2012, 4:04 PM

## 2012-08-23 ENCOUNTER — Ambulatory Visit (INDEPENDENT_AMBULATORY_CARE_PROVIDER_SITE_OTHER): Payer: BC Managed Care – PPO | Admitting: Internal Medicine

## 2012-08-23 ENCOUNTER — Encounter: Payer: Self-pay | Admitting: Internal Medicine

## 2012-08-23 VITALS — BP 104/68 | HR 68 | Temp 98.6°F | Resp 16 | Wt 204.0 lb

## 2012-08-23 DIAGNOSIS — M25569 Pain in unspecified knee: Secondary | ICD-10-CM

## 2012-08-23 DIAGNOSIS — M25561 Pain in right knee: Secondary | ICD-10-CM

## 2012-08-23 NOTE — Patient Instructions (Signed)
Knee Exercises  EXERCISES  RANGE OF MOTION(ROM) AND STRETCHING EXERCISES  These exercises may help you when beginning to rehabilitate your injury. Your symptoms may resolve with or without further involvement from your physician, physical therapist or athletic trainer. While completing these exercises, remember:   · Restoring tissue flexibility helps normal motion to return to the joints. This allows healthier, less painful movement and activity.  · An effective stretch should be held for at least 30 seconds.  · A stretch should never be painful. You should only feel a gentle lengthening or release in the stretched tissue.  STRETCH - Knee Extension, Prone  · Lie on your stomach on a firm surface, such as a bed or countertop. Place your right / left knee and leg just beyond the edge of the surface. You may wish to place a towel under the far end of your right / left thigh for comfort.  · Relax your leg muscles and allow gravity to straighten your knee. Your clinician may advise you to add an ankle weight if more resistance is helpful for you.  · You should feel a stretch in the back of your right / left knee. Hold this position for __________ seconds.  Repeat __________ times. Complete this stretch __________ times per day.  * Your physician, physical therapist or athletic trainer may ask you to add ankle weight to enhance your stretch.   RANGE OF MOTION - Knee Flexion, Active  · Lie on your back with both knees straight. (If this causes back discomfort, bend your opposite knee, placing your foot flat on the floor.)  · Slowly slide your heel back toward your buttocks until you feel a gentle stretch in the front of your knee or thigh.  · Hold for __________ seconds. Slowly slide your heel back to the starting position.  Repeat __________ times. Complete this exercise __________ times per day.   STRETCH - Quadriceps, Prone   · Lie on your stomach on a firm surface, such as a bed or padded floor.  · Bend your right /  left knee and grasp your ankle. If you are unable to reach, your ankle or pant leg, use a belt around your foot to lengthen your reach.  · Gently pull your heel toward your buttocks. Your knee should not slide out to the side. You should feel a stretch in the front of your thigh and/or knee.  · Hold this position for __________ seconds.  Repeat __________ times. Complete this stretch __________ times per day.   STRETCH - Hamstrings, Supine   · Lie on your back. Loop a belt or towel over the ball of your right / left foot.  · Straighten your right / left knee and slowly pull on the belt to raise your leg. Do not allow the right / left knee to bend. Keep your opposite leg flat on the floor.  · Raise the leg until you feel a gentle stretch behind your right / left knee or thigh. Hold this position for __________ seconds.  Repeat __________ times. Complete this stretch __________ times per day.   STRENGTHENING EXERCISES  These exercises may help you when beginning to rehabilitate your injury. They may resolve your symptoms with or without further involvement from your physician, physical therapist or athletic trainer. While completing these exercises, remember:   · Muscles can gain both the endurance and the strength needed for everyday activities through controlled exercises.  · Complete these exercises as instructed by your physician,   physical therapist or athletic trainer. Progress the resistance and repetitions only as guided.  · You may experience muscle soreness or fatigue, but the pain or discomfort you are trying to eliminate should never worsen during these exercises. If this pain does worsen, stop and make certain you are following the directions exactly. If the pain is still present after adjustments, discontinue the exercise until you can discuss the trouble with your clinician.  STRENGTH - Quadriceps, Isometrics  · Lie on your back with your right / left leg extended and your opposite knee  bent.  · Gradually tense the muscles in the front of your right / left thigh. You should see either your knee cap slide up toward your hip or increased dimpling just above the knee. This motion will push the back of the knee down toward the floor/mat/bed on which you are lying.  · Hold the muscle as tight as you can without increasing your pain for __________ seconds.  · Relax the muscles slowly and completely in between each repetition.  Repeat __________ times. Complete this exercise __________ times per day.   STRENGTH - Quadriceps, Short Arcs   · Lie on your back. Place a __________ inch towel roll under your knee so that the knee slightly bends.  · Raise only your lower leg by tightening the muscles in the front of your thigh. Do not allow your thigh to rise.  · Hold this position for __________ seconds.  Repeat __________ times. Complete this exercise __________ times per day.   OPTIONAL ANKLE WEIGHTS: Begin with ____________________, but DO NOT exceed ____________________. Increase in 1 pound/0.5 kilogram increments.   STRENGTH - Quadriceps, Straight Leg Raises   Quality counts! Watch for signs that the quadriceps muscle is working to insure you are strengthening the correct muscles and not "cheating" by substituting with healthier muscles.  · Lay on your back with your right / left leg extended and your opposite knee bent.  · Tense the muscles in the front of your right / left thigh. You should see either your knee cap slide up or increased dimpling just above the knee. Your thigh may even quiver.  · Tighten these muscles even more and raise your leg 4 to 6 inches off the floor. Hold for __________ seconds.  · Keeping these muscles tense, lower your leg.  · Relax the muscles slowly and completely in between each repetition.  Repeat __________ times. Complete this exercise __________ times per day.   STRENGTH - Hamstring, Curls  · Lay on your stomach with your legs extended. (If you lay on a bed, your feet  may hang over the edge.)  · Tighten the muscles in the back of your thigh to bend your right / left knee up to 90 degrees. Keep your hips flat on the bed/floor.  · Hold this position for __________ seconds.  · Slowly lower your leg back to the starting position.  Repeat __________ times. Complete this exercise __________ times per day.   OPTIONAL ANKLE WEIGHTS: Begin with ____________________, but DO NOT exceed ____________________. Increase in 1 pound/0.5 kilogram increments.   STRENGTH - Quadriceps, Squats  · Stand in a door frame so that your feet and knees are in line with the frame.  · Use your hands for balance, not support, on the frame.  · Slowly lower your weight, bending at the hips and knees. Keep your lower legs upright so that they are parallel with the door frame. Squat only within the range   that does not increase your knee pain. Never let your hips drop below your knees.  · Slowly return upright, pushing with your legs, not pulling with your hands.  Repeat __________ times. Complete this exercise __________ times per day.   STRENGTH - Quadriceps, Wall Slides   Follow guidelines for form closely. Increased knee pain often results from poorly placed feet or knees.  · Lean against a smooth wall or door and walk your feet out 18-24 inches. Place your feet hip-width apart.  · Slowly slide down the wall or door until your knees bend __________ degrees.* Keep your knees over your heels, not your toes, and in line with your hips, not falling to either side.  · Hold for __________ seconds. Stand up to rest for __________ seconds in between each repetition.  Repeat __________ times. Complete this exercise __________ times per day.  * Your physician, physical therapist or athletic trainer will alter this angle based on your symptoms and progress.  Document Released: 06/11/2005 Document Revised: 10/20/2011 Document Reviewed: 11/09/2008  [employer]® Patient Information ©2013 [employer].

## 2012-08-23 NOTE — Progress Notes (Signed)
Subjective:    Patient ID: Frank [name], male    DOB: [birth year], 57 y.o.   MRN: [id number]  HPI  Pt presents to the clinic today with c/o of right knee pain. This started 2 days ago after running into a table. There was no bruising or swelling. He put some ice on it, and elevated it and it feels much better. He has had this problem before. He is in no pain right now.  Review of Systems      Past Medical History  Diagnosis Date  . COLONIC POLYPS 02/01/2008  . DIABETES MELLITUS, TYPE II 05/20/2010  . HYPERLIPIDEMIA 10/09/2008  . ANXIETY DEPRESSION 02/01/2008  . ERECTILE DYSFUNCTION 10/09/2008  . ADD 10/09/2008  . SLEEP APNEA, OBSTRUCTIVE 02/01/2008  . MORTON'S NEUROMA 05/20/2010  . PERIPHERAL NEUROPATHY 05/20/2010  . Other specified forms of hearing loss 06/27/2009  . HYPERTENSION 10/09/2008  . HEMORRHOIDS 02/01/2008  . ALLERGIC RHINITIS 10/09/2008  . Stricture and stenosis of esophagus 02/02/2008  . GERD 02/01/2008  . HIATAL HERNIA 02/01/2008  . ERECTILE DYSFUNCTION, ORGANIC 05/20/2010  . WRIST PAIN, LEFT 12/05/2009  . FOOT PAIN, LEFT 05/20/2010  . PERIPHERAL EDEMA 05/20/2010  . DYSPNEA 03/12/2010  . Abdominal pain, unspecified site 01/19/2009  . Abdominal pain, left lower quadrant 06/06/2010  . Type II or unspecified type diabetes mellitus without mention of complication, uncontrolled 11/14/2010    Current Outpatient Prescriptions  Medication Sig Dispense Refill  . amLODipine (NORVASC) 5 MG tablet Take 1 tablet (5 mg total) by mouth daily.  90 tablet  2  . atorvastatin (LIPITOR) 10 MG tablet Take 1 tablet (10 mg total) by mouth daily.  90 tablet  3  . clonazePAM (KLONOPIN) 0.5 MG tablet TAKE 1 TABLET BY MOUTH TWICE DAILY AS NEEDED  60 tablet  2  . DULoxetine (CYMBALTA) 20 MG capsule Take 20 mg by mouth 3 (three) times daily.        Marland Kitchen glucose blood (FREESTYLE TEST STRIPS) test strip Use as instructed  100 each  12  . hydrochlorothiazide (HYDRODIURIL) 25 MG tablet TAKE 1 TABLET BY MOUTH  DAILY  30 tablet  10  . L-Methylfolate-B6-B12 (METANX) 3-35-2 MG TABS Take by mouth 2 (two) times daily.        . metFORMIN (GLUCOPHAGE-XR) 500 MG 24 hr tablet Take 1 tablet (500 mg total) by mouth daily with breakfast.  90 tablet  3  . methylphenidate (CONCERTA) 54 MG CR tablet 1 tab by mouth twice per day, to fill Jul 01, 2012  60 tablet  0  . pregabalin (LYRICA) 100 MG capsule Take 100 mg by mouth 3 (three) times daily.        Marland Kitchen aspirin (ASPIRIN EC) 81 MG EC tablet Take 81 mg by mouth daily.        Marland Kitchen gabapentin (NEURONTIN) 600 MG tablet Take 600 mg by mouth daily.        . metroNIDAZOLE (FLAGYL) 250 MG tablet Take 250 mg by mouth. 1 by mouth four times per day for 10 days         Allergies  Allergen Reactions  . Codeine     REACTION: Itching    Family History  Problem Relation Age of Onset  . Diabetes Mother   . Heart disease Mother   . Hyperlipidemia Mother   . Depression Mother   . Diabetes Brother     History   Social History  . Marital Status: Single    Spouse Name: N/A  Number of Children: N/A  . 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 Smoking status: Never Smoker   . Smokeless tobacco: Not on file  . Alcohol Use: No     Comment: moderately  . Drug Use: Yes    Special: Methaqualone  . Sexually Active: Not on file   Other Topics Concern  . Not on file   Social History Narrative  . No narrative on file     Constitutional: Denies fever, malaise, fatigue, headache or abrupt weight changes. . Musculoskeletal: Pt reports right knee pain.Denies decrease in range of motion, difficulty with gait, muscle pain or joint swelling.  Neurological: Denies dizziness, difficulty with memory, difficulty with speech or problems with balance and coordination.   No other specific complaints in a complete review of systems (except as listed in HPI above).  Objective:   Physical Exam   BP 104/68  Pulse  68  Temp 98.6 F (37 C) (Oral)  Resp 16  Wt 204 lb (92.534 kg)  SpO2 97% Wt Readings from Last 3 Encounters:  08/23/12 204 lb (92.534 kg)  08/09/12 208 lb 3.2 oz (94.439 kg)  06/04/12 211 lb 4 oz (95.822 kg)    General: Appears his stated age, well developed, well nourished in NAD.Marland Kitchen  Cardiovascular: Normal rate and rhythm. S1,S2 noted.  No murmur, rubs or gallops noted. No JVD or BLE edema. No carotid bruits noted. Pulmonary/Chest: Normal effort and positive vesicular breath sounds. No respiratory distress. No wheezes, rales or ronchi noted.  Musculoskeletal: Normal range of motion. No signs of joint swelling. No difficulty with gait.  Neurological: Alert and oriented. Cranial nerves II-XII intact. Coordination normal. +DTRs bilaterally.       Assessment & Plan:   Right Knee pain, secondary to trauma, new onset with additional workup required:  Continue RICE therapy Perform exercises and indicated on patient handout  RTC as needed or if symptoms persist

## 2012-09-08 ENCOUNTER — Other Ambulatory Visit: Payer: Self-pay

## 2012-09-08 MED ORDER — METHYLPHENIDATE HCL ER (OSM) 54 MG PO TBCR: EXTENDED_RELEASE_TABLET | ORAL | Status: DC

## 2012-09-08 NOTE — Telephone Encounter (Signed)
Called the patient informed to pickup hardcopy at the front desk. (Left a detailed message). 

## 2012-09-08 NOTE — Telephone Encounter (Signed)
Done hardcopy to robin  

## 2012-09-13 ENCOUNTER — Other Ambulatory Visit: Payer: Self-pay | Admitting: Internal Medicine

## 2012-09-20 ENCOUNTER — Encounter: Payer: BC Managed Care – PPO | Admitting: Internal Medicine

## 2012-09-20 DIAGNOSIS — Z0289 Encounter for other administrative examinations: Secondary | ICD-10-CM

## 2012-09-30 ENCOUNTER — Other Ambulatory Visit: Payer: Self-pay | Admitting: Internal Medicine

## 2012-09-30 NOTE — Telephone Encounter (Signed)
Faxed hardcopy to pharmacy. 

## 2012-09-30 NOTE — Telephone Encounter (Signed)
Done hardcopy to robin  

## 2012-10-14 ENCOUNTER — Telehealth: Payer: Self-pay | Admitting: Internal Medicine

## 2012-10-14 MED ORDER — METHYLPHENIDATE HCL ER (OSM) 54 MG PO TBCR: EXTENDED_RELEASE_TABLET | ORAL | Status: DC

## 2012-10-14 NOTE — Telephone Encounter (Signed)
Called the patient left detailed message that hardcopy of prescription is ready for pickup at the front desk.

## 2012-10-14 NOTE — Telephone Encounter (Signed)
Requesting a refill on concerta.  Called Tues. Also.

## 2012-10-14 NOTE — Telephone Encounter (Signed)
Done hardcopy to robin  

## 2012-11-09 ENCOUNTER — Other Ambulatory Visit: Payer: Self-pay

## 2012-11-09 MED ORDER — METHYLPHENIDATE HCL ER (OSM) 54 MG PO TBCR: EXTENDED_RELEASE_TABLET | ORAL | Status: DC

## 2012-11-09 NOTE — Telephone Encounter (Signed)
Done hardcopy to robin  

## 2012-11-10 NOTE — Telephone Encounter (Signed)
Called the patient left detailed message to pickup hardcopy at the front desk.

## 2012-12-15 ENCOUNTER — Telehealth: Payer: Self-pay | Admitting: *Deleted

## 2012-12-15 MED ORDER — METHYLPHENIDATE HCL ER (OSM) 54 MG PO TBCR: EXTENDED_RELEASE_TABLET | ORAL | Status: DC

## 2012-12-15 NOTE — Telephone Encounter (Signed)
Pt requesting refill of Concerta-last written 11/09/2012 #60 with 0 refills-please advise.

## 2012-12-15 NOTE — Telephone Encounter (Signed)
Called the patient (left message) to pickup hardcopy at the front desk. 

## 2012-12-15 NOTE — Telephone Encounter (Signed)
Done hardcopy to robin  

## 2013-01-13 ENCOUNTER — Other Ambulatory Visit: Payer: Self-pay

## 2013-01-13 MED ORDER — METHYLPHENIDATE HCL ER (OSM) 54 MG PO TBCR: EXTENDED_RELEASE_TABLET | ORAL | Status: DC

## 2013-01-13 NOTE — Telephone Encounter (Signed)
Called the patient left detailed message that hardcopy is ready for pickup at the front desk. 

## 2013-01-13 NOTE — Telephone Encounter (Signed)
Done hardcopy to robin  

## 2013-01-18 ENCOUNTER — Other Ambulatory Visit: Payer: Self-pay | Admitting: Internal Medicine

## 2013-02-15 ENCOUNTER — Other Ambulatory Visit: Payer: Self-pay

## 2013-02-15 MED ORDER — METHYLPHENIDATE HCL ER (OSM) 54 MG PO TBCR: EXTENDED_RELEASE_TABLET | ORAL | Status: DC

## 2013-02-15 MED ORDER — CLONAZEPAM 0.5 MG PO TABS: ORAL_TABLET | ORAL | Status: DC

## 2013-02-15 NOTE — Telephone Encounter (Signed)
Done hardcopy to robin  

## 2013-02-15 NOTE — Telephone Encounter (Signed)
Called the patient informed to pickup hardcopy at the front desk. 

## 2013-03-21 ENCOUNTER — Other Ambulatory Visit: Payer: Self-pay

## 2013-03-21 ENCOUNTER — Telehealth: Payer: Self-pay

## 2013-03-21 DIAGNOSIS — Z0000 Encounter for general adult medical examination without abnormal findings: Secondary | ICD-10-CM

## 2013-03-21 MED ORDER — METHYLPHENIDATE HCL ER (OSM) 54 MG PO TBCR: EXTENDED_RELEASE_TABLET | ORAL | Status: DC

## 2013-03-21 NOTE — Telephone Encounter (Signed)
CPX labs entered  

## 2013-03-21 NOTE — Telephone Encounter (Signed)
Done hardcopy to robin  

## 2013-03-21 NOTE — Telephone Encounter (Signed)
Called the patient informed to pickup hardcopy at the front desk. (left detailed msg.)

## 2013-03-24 ENCOUNTER — Other Ambulatory Visit: Payer: Self-pay | Admitting: Internal Medicine

## 2013-03-25 ENCOUNTER — Ambulatory Visit (INDEPENDENT_AMBULATORY_CARE_PROVIDER_SITE_OTHER): Payer: BC Managed Care – PPO

## 2013-03-25 ENCOUNTER — Encounter: Payer: Self-pay | Admitting: Internal Medicine

## 2013-03-25 DIAGNOSIS — R7309 Other abnormal glucose: Secondary | ICD-10-CM

## 2013-03-25 DIAGNOSIS — Z0000 Encounter for general adult medical examination without abnormal findings: Secondary | ICD-10-CM

## 2013-03-25 LAB — CBC WITH DIFFERENTIAL/PLATELET
Basophils Absolute: 0 10*3/uL (ref 0.0–0.1)
Eosinophils Relative: 1.1 % (ref 0.0–5.0)
Lymphs Abs: 2.4 10*3/uL (ref 0.7–4.0)
Monocytes Absolute: 0.6 10*3/uL (ref 0.1–1.0)
Monocytes Relative: 8.2 % (ref 3.0–12.0)
Neutrophils Relative %: 55 % (ref 43.0–77.0)
Platelets: 224 10*3/uL (ref 150.0–400.0)
RDW: 13 % (ref 11.5–14.6)
WBC: 6.9 10*3/uL (ref 4.5–10.5)

## 2013-03-25 LAB — BASIC METABOLIC PANEL
CO2: 35 mEq/L — ABNORMAL HIGH (ref 19–32)
Calcium: 9.5 mg/dL (ref 8.4–10.5)
GFR: 101.99 mL/min (ref >60.00)
Glucose, Bld: 118 mg/dL — ABNORMAL HIGH (ref 70–99)
Potassium: 4.3 mEq/L (ref 3.5–5.1)
Sodium: 137 mEq/L (ref 135–145)

## 2013-03-25 LAB — URINALYSIS, ROUTINE W REFLEX MICROSCOPIC
Bilirubin Urine: NEGATIVE
Hgb urine dipstick: NEGATIVE
Ketones, ur: NEGATIVE
Urine Glucose: NEGATIVE
Urobilinogen, UA: 0.2 (ref 0.0–1.0)

## 2013-03-25 LAB — HEPATIC FUNCTION PANEL
AST: 21 U/L (ref 0–37)
Albumin: 4.1 g/dL (ref 3.5–5.2)
Alkaline Phosphatase: 70 U/L (ref 39–117)
Bilirubin, Direct: 0.1 mg/dL (ref 0.0–0.3)

## 2013-03-25 LAB — LIPID PANEL
HDL: 49.5 mg/dL (ref >39.00)
LDL Cholesterol: 75 mg/dL (ref 0–99)
Total CHOL/HDL Ratio: 3
Triglycerides: 79 mg/dL (ref 0.0–149.0)
VLDL: 15.8 mg/dL (ref 0.0–40.0)

## 2013-04-09 ENCOUNTER — Ambulatory Visit (INDEPENDENT_AMBULATORY_CARE_PROVIDER_SITE_OTHER): Payer: BC Managed Care – PPO | Admitting: Family Medicine

## 2013-04-09 ENCOUNTER — Encounter: Payer: Self-pay | Admitting: Family Medicine

## 2013-04-09 VITALS — BP 142/80 | HR 103 | Temp 98.8°F | Wt 199.8 lb

## 2013-04-09 DIAGNOSIS — J019 Acute sinusitis, unspecified: Secondary | ICD-10-CM | POA: Insufficient documentation

## 2013-04-09 MED ORDER — AZITHROMYCIN 250 MG PO TABS: ORAL_TABLET | ORAL | Status: DC

## 2013-04-09 NOTE — Assessment & Plan Note (Signed)
Anticipate acute sinusitis leading to acute bronchitis. Discussed likely viral nature of illness.   Discussed OTC remedies - mucinex, ibuprofen, nasal saline. If not better with this, fill abx at pharmacy.  Pt agrees with plan.

## 2013-04-09 NOTE — Patient Instructions (Signed)
You have a sinus infection. Take medicine as prescribed: zpack if not improved in next 1-2 days. Push fluids and plenty of rest. Nasal saline irrigation or neti pot to help drain sinuses. May use plain mucinex or immediate release guaifenesin with plenty of fluid to help mobilize mucous. Let us know if fever >101.5, trouble opening/closing mouth, difficulty swallowing, or worsening - you may need to be seen again.

## 2013-04-09 NOTE — Progress Notes (Signed)
  Subjective:    Patient ID: Frank [name], male    DOB: [birth year], 57 y.o.   MRN: [id number]  HPI CC: sinusitis?  1+ wk h/o sinus congestion.  Clear thick mucous coming out of nose.  Coughing productive of darker sputum.  Initially sinus headache improved, but now seems to be localizing to chest.  +PNdrainage.  Able to sleep ok despite cough.  No fevers/chills, ear pain, tooth pain, ST, dyspnea or wheeze.  So far has tried claritin and afrin nasal spray  Tends to get sinus infection once a year.  No h/o asthma, COPD.  + allergies. No smokers at home. No sick contacts at home.  Amoxicillin in past caused GI upset  Past Medical History  Diagnosis Date  . COLONIC POLYPS 02/01/2008  . DIABETES MELLITUS, TYPE II 05/20/2010  . HYPERLIPIDEMIA 10/09/2008  . ANXIETY DEPRESSION 02/01/2008  . ERECTILE DYSFUNCTION 10/09/2008  . ADD 10/09/2008  . SLEEP APNEA, OBSTRUCTIVE 02/01/2008  . MORTON'S NEUROMA 05/20/2010  . PERIPHERAL NEUROPATHY 05/20/2010  . Other specified forms of hearing loss 06/27/2009  . HYPERTENSION 10/09/2008  . HEMORRHOIDS 02/01/2008  . ALLERGIC RHINITIS 10/09/2008  . Stricture and stenosis of esophagus 02/02/2008  . GERD 02/01/2008  . HIATAL HERNIA 02/01/2008  . ERECTILE DYSFUNCTION, ORGANIC 05/20/2010  . WRIST PAIN, LEFT 12/05/2009  . FOOT PAIN, LEFT 05/20/2010  . PERIPHERAL EDEMA 05/20/2010  . DYSPNEA 03/12/2010  . Abdominal pain, unspecified site 01/19/2009  . Abdominal pain, left lower quadrant 06/06/2010  . Type II or unspecified type diabetes mellitus without mention of complication, uncontrolled 11/14/2010      Review of Systems Per HPI    Objective:   Physical Exam  Nursing note and vitals reviewed. Constitutional: He appears well-developed and well-nourished. No distress.  HENT:  Head: Normocephalic and atraumatic.  Right Ear: Hearing, tympanic membrane, external ear and ear canal normal.  Left Ear: Hearing, tympanic membrane, external ear and ear canal normal.   Nose: Mucosal edema present. No rhinorrhea. Right sinus exhibits no maxillary sinus tenderness and no frontal sinus tenderness. Left sinus exhibits no maxillary sinus tenderness and no frontal sinus tenderness.  Mouth/Throat: Uvula is midline, oropharynx is clear and moist and mucous membranes are normal. No oropharyngeal exudate, posterior oropharyngeal edema, posterior oropharyngeal erythema or tonsillar abscesses.  Evident congestion  Eyes: Conjunctivae and EOM are normal. Pupils are equal, round, and reactive to light. No scleral icterus.  Neck: Normal range of motion. Neck supple.  Cardiovascular: Normal rate, regular rhythm, normal heart sounds and intact distal pulses.   No murmur heard. Pulmonary/Chest: Effort normal and breath sounds normal. No respiratory distress. He has no wheezes. He has no rales.  Lymphadenopathy:    He has no cervical adenopathy.  Skin: Skin is warm and dry. No rash noted.       Assessment & Plan:

## 2013-04-14 ENCOUNTER — Other Ambulatory Visit: Payer: Self-pay | Admitting: Internal Medicine

## 2013-04-19 ENCOUNTER — Encounter: Payer: BC Managed Care – PPO | Admitting: Internal Medicine

## 2013-04-19 ENCOUNTER — Other Ambulatory Visit: Payer: Self-pay

## 2013-04-19 MED ORDER — METHYLPHENIDATE HCL ER (OSM) 54 MG PO TBCR: EXTENDED_RELEASE_TABLET | ORAL | Status: DC

## 2013-04-19 NOTE — Telephone Encounter (Signed)
Called the patient left detailed message to pickup hardcopy at the front desk

## 2013-04-19 NOTE — Telephone Encounter (Signed)
Done hardcopy to robin  

## 2013-04-26 ENCOUNTER — Encounter: Payer: BC Managed Care – PPO | Admitting: Internal Medicine

## 2013-05-05 ENCOUNTER — Encounter: Payer: Self-pay | Admitting: Internal Medicine

## 2013-05-05 ENCOUNTER — Ambulatory Visit (INDEPENDENT_AMBULATORY_CARE_PROVIDER_SITE_OTHER): Payer: BC Managed Care – PPO | Admitting: Internal Medicine

## 2013-05-05 VITALS — BP 122/82 | HR 96 | Temp 98.8°F | Ht 69.0 in | Wt 206.0 lb

## 2013-05-05 DIAGNOSIS — E119 Type 2 diabetes mellitus without complications: Secondary | ICD-10-CM

## 2013-05-05 DIAGNOSIS — Z0000 Encounter for general adult medical examination without abnormal findings: Secondary | ICD-10-CM

## 2013-05-05 DIAGNOSIS — Z23 Encounter for immunization: Secondary | ICD-10-CM

## 2013-05-05 MED ORDER — HYDROCHLOROTHIAZIDE 25 MG PO TABS: ORAL_TABLET | ORAL | Status: DC

## 2013-05-05 NOTE — Progress Notes (Signed)
Subjective:    Patient ID: Frank [name], male    DOB: 11/22/1955, 57 y.o.   MRN: [id number]  HPI  Here for wellness and f/u;  Overall doing ok;  Pt denies CP, worsening SOB, DOE, wheezing, orthopnea, PND, worsening LE edema, palpitations, dizziness or syncope.  Pt denies neurological change such as new headache, facial or extremity weakness.  Pt denies polydipsia, polyuria, or low sugar symptoms. Pt states overall good compliance with treatment and medications, good tolerability, and has been trying to follow lower cholesterol diet.  Pt denies worsening depressive symptoms, suicidal ideation or panic. No fever, night sweats, wt loss, loss of appetite, or other constitutional symptoms.  Pt states good ability with ADL's, has low fall risk, home safety reviewed and adequate, no other significant changes in hearing or vision, and only occasionally active with exercise.  Does have nocturia 1-3 times per night, but doesn't think too serious at this time. Denies urinary symptoms such as dysuria, frequency, urgency, flank pain, hematuria or n/v, fever, chills.  Has recurring loose stools and diarrhea, has seen Dr Arlyce Dice, but no increased pain, blood, fever.  Does have some crampy pain to LLQ prior to stools. Due for flu shot today Has hx of mult moles, none changed and declines derm referral Past Medical History  Diagnosis Date  . COLONIC POLYPS 02/01/2008  . DIABETES MELLITUS, TYPE II 05/20/2010  . HYPERLIPIDEMIA 10/09/2008  . ANXIETY DEPRESSION 02/01/2008  . ERECTILE DYSFUNCTION 10/09/2008  . ADD 10/09/2008  . SLEEP APNEA, OBSTRUCTIVE 02/01/2008  . MORTON'S NEUROMA 05/20/2010  . PERIPHERAL NEUROPATHY 05/20/2010  . Other specified forms of hearing loss 06/27/2009  . HYPERTENSION 10/09/2008  . HEMORRHOIDS 02/01/2008  . ALLERGIC RHINITIS 10/09/2008  . Stricture and stenosis of esophagus 02/02/2008  . GERD 02/01/2008  . HIATAL HERNIA 02/01/2008  . ERECTILE DYSFUNCTION, ORGANIC 05/20/2010  . WRIST PAIN, LEFT  12/05/2009  . FOOT PAIN, LEFT 05/20/2010  . PERIPHERAL EDEMA 05/20/2010  . DYSPNEA 03/12/2010  . Abdominal pain, unspecified site 01/19/2009  . Abdominal pain, left lower quadrant 06/06/2010  . Type II or unspecified type diabetes mellitus without mention of complication, uncontrolled 11/14/2010   Past Surgical History  Procedure Laterality Date  . Carpal tunnel release    . Rotator cuff repair    . Esophageal dilation  july 2009    reports that he has never smoked. He does not have any smokeless tobacco history on file. He reports that he uses illicit drugs (Methaqualone). He reports that he does not drink alcohol. family history includes Depression in his mother; Diabetes in his brother and mother; Heart disease in his mother; Hyperlipidemia in his mother. Allergies  Allergen Reactions  . Codeine     REACTION: Itching   Current Outpatient Prescriptions on File Prior to Visit  Medication Sig Dispense Refill  . amLODipine (NORVASC) 5 MG tablet TAKE 1 TABLET BY MOUTH EVERY DAY  90 tablet  2  . aspirin (ASPIRIN EC) 81 MG EC tablet Take 81 mg by mouth daily.        Marland Kitchen atorvastatin (LIPITOR) 10 MG tablet TAKE 1 TABLET BY MOUTH EVERY DAY  90 tablet  0  . clonazePAM (KLONOPIN) 0.5 MG tablet TAKE 1 TABLET BY MOUTH TWICE DAILY AS NEEDED  60 tablet  2  . DULoxetine (CYMBALTA) 20 MG capsule Take 20 mg by mouth 3 (three) times daily.        Marland Kitchen glucose blood (FREESTYLE TEST STRIPS) test strip Use as instructed  100  each  12  . hydrochlorothiazide (HYDRODIURIL) 25 MG tablet TAKE 1 TABLET BY MOUTH DAILY  30 tablet  11  . metFORMIN (GLUCOPHAGE-XR) 500 MG 24 hr tablet TAKE 1 TABLET BY MOUTH DAILY WITH BREAKFAST  90 tablet  0  . methylphenidate (CONCERTA) 54 MG CR tablet 1 tab by mouth twice per day  60 tablet  0  . pregabalin (LYRICA) 100 MG capsule Take 100 mg by mouth 3 (three) times daily.         No current facility-administered medications on file prior to visit.    Review of  Systems Constitutional: Negative for diaphoresis, activity change, appetite change or unexpected weight change.  HENT: Negative for hearing loss, ear pain, facial swelling, mouth sores and neck stiffness.   Eyes: Negative for pain, redness and visual disturbance.  Respiratory: Negative for shortness of breath and wheezing.   Cardiovascular: Negative for chest pain and palpitations.  Gastrointestinal: Negative for diarrhea, blood in stool, abdominal distention or other pain Genitourinary: Negative for hematuria, flank pain or change in urine volume.  Musculoskeletal: Negative for myalgias and joint swelling.  Skin: Negative for color change and wound.  Neurological: Negative for syncope and numbness. other than noted Hematological: Negative for adenopathy.  Psychiatric/Behavioral: Negative for hallucinations, self-injury, decreased concentration and agitation.      Objective:   Physical Exam BP 122/82  Pulse 96  Temp(Src) 98.8 F (37.1 C) (Oral)  Ht 5\' 9"  (1.753 m)  Wt 206 lb (93.441 kg)  BMI 30.41 kg/m2  SpO2 93% VS noted,  Constitutional: Pt is oriented to person, place, and time. Appears well-developed and well-nourished.  Head: Normocephalic and atraumatic.  Right Ear: External ear normal.  Left Ear: External ear normal.  Nose: Nose normal.  Mouth/Throat: Oropharynx is clear and moist.  Eyes: Conjunctivae and EOM are normal. Pupils are equal, round, and reactive to light.  Neck: Normal range of motion. Neck supple. No JVD present. No tracheal deviation present.  Cardiovascular: Normal rate, regular rhythm, normal heart sounds and intact distal pulses.   Pulmonary/Chest: Effort normal and breath sounds normal.  Abdominal: Soft. Bowel sounds are normal. There is no tenderness. No HSM  Musculoskeletal: Normal range of motion. Exhibits no edema.  Lymphadenopathy:  Has no cervical adenopathy.  Neurological: Pt is alert and oriented to person, place, and time. Pt has normal  reflexes. No cranial nerve deficit.  Skin: Skin is warm and dry. No rash noted.  Psychiatric:  Has  normal mood and affect. Behavior is normal.      Assessment & Plan:

## 2013-05-05 NOTE — Assessment & Plan Note (Signed)
stable overall by history and exam, recent data reviewed with pt, and pt to continue medical treatment as before,  to f/u any worsening symptoms or concerns Lab Results  Component Value Date   HGBA1C 6.4 03/25/2013

## 2013-05-05 NOTE — Assessment & Plan Note (Addendum)

## 2013-05-05 NOTE — Patient Instructions (Signed)
Your EKG was OK today Your Labs were reviewed with you today You had the flu shot today Please continue all other medications as before, and refills have been done if requested 0 the fluid pill Please have the pharmacy call with any other refills you may need. No other blood work needed today Please call if you change your mind about the dermatology referral  Please remember to sign up for My Chart if you have not done so, as this will be important to you in the future with finding out test results, communicating by private email, and scheduling acute appointments online when needed.  Please return in 6 months, or sooner if needed, with Lab testing done 3-5 days before

## 2013-05-25 ENCOUNTER — Telehealth: Payer: Self-pay | Admitting: *Deleted

## 2013-05-25 MED ORDER — METHYLPHENIDATE HCL ER (OSM) 54 MG PO TBCR: EXTENDED_RELEASE_TABLET | ORAL | Status: DC

## 2013-05-25 NOTE — Telephone Encounter (Signed)
Done hardcopy to robin  

## 2013-05-25 NOTE — Telephone Encounter (Signed)
Pt called requesting Concerta refill.  Last OV 9.25.14.  Please advise

## 2013-05-25 NOTE — Telephone Encounter (Signed)
Called the patient (left message) to pickup hardcopy at the front desk.

## 2013-06-21 ENCOUNTER — Other Ambulatory Visit: Payer: Self-pay

## 2013-06-21 MED ORDER — METHYLPHENIDATE HCL ER (OSM) 54 MG PO TBCR: EXTENDED_RELEASE_TABLET | ORAL | Status: DC

## 2013-06-21 MED ORDER — HYDROCHLOROTHIAZIDE 25 MG PO TABS: ORAL_TABLET | ORAL | Status: DC

## 2013-06-21 NOTE — Telephone Encounter (Signed)
Done hardcopy to robin  

## 2013-06-21 NOTE — Telephone Encounter (Signed)
Called the patient informed to pickup hardcopy at the front desk. 

## 2013-06-28 ENCOUNTER — Other Ambulatory Visit: Payer: Self-pay | Admitting: *Deleted

## 2013-06-28 NOTE — Telephone Encounter (Signed)
Received fax pt needing PA on his generic concerta. Notified insurance faxing over PA to be completed...lmb

## 2013-06-30 NOTE — Telephone Encounter (Signed)
PA has been completed received back med was approved. Notified pharmacy spoke with Lajean Silvius gave approval stauts...Raechel Chute

## 2013-07-04 ENCOUNTER — Encounter: Payer: Self-pay | Admitting: Gastroenterology

## 2013-07-06 ENCOUNTER — Other Ambulatory Visit: Payer: Self-pay | Admitting: Internal Medicine

## 2013-07-26 ENCOUNTER — Other Ambulatory Visit: Payer: Self-pay | Admitting: Internal Medicine

## 2013-07-26 MED ORDER — METHYLPHENIDATE HCL ER (OSM) 54 MG PO TBCR: EXTENDED_RELEASE_TABLET | ORAL | Status: DC

## 2013-07-26 NOTE — Telephone Encounter (Signed)
concerta - Done hardcopy to robin

## 2013-07-26 NOTE — Telephone Encounter (Signed)
Done hardcopy to robin  

## 2013-07-26 NOTE — Telephone Encounter (Signed)
Called the patient left a detailed message hardcopy is ready for pickup at the front desk. 

## 2013-07-26 NOTE — Telephone Encounter (Signed)
Faxed hardcopy of Clonazepam to [facility].

## 2013-08-15 ENCOUNTER — Encounter: Payer: Self-pay | Admitting: Gastroenterology

## 2013-08-15 ENCOUNTER — Ambulatory Visit (INDEPENDENT_AMBULATORY_CARE_PROVIDER_SITE_OTHER): Payer: BC Managed Care – PPO | Admitting: Gastroenterology

## 2013-08-15 VITALS — BP 140/76 | HR 112 | Ht 69.0 in | Wt 207.0 lb

## 2013-08-15 DIAGNOSIS — D126 Benign neoplasm of colon, unspecified: Secondary | ICD-10-CM

## 2013-08-15 DIAGNOSIS — K222 Esophageal obstruction: Secondary | ICD-10-CM

## 2013-08-15 DIAGNOSIS — R194 Change in bowel habit: Secondary | ICD-10-CM | POA: Insufficient documentation

## 2013-08-15 DIAGNOSIS — K625 Hemorrhage of anus and rectum: Secondary | ICD-10-CM

## 2013-08-15 DIAGNOSIS — R198 Other specified symptoms and signs involving the digestive system and abdomen: Secondary | ICD-10-CM

## 2013-08-15 NOTE — Patient Instructions (Signed)
You have been scheduled for a colonoscopy with propofol. Please follow written instructions given to you at your visit today.  Please pick up your prep kit at the pharmacy within the next 1-3 days. If you use inhalers (even only as needed), please bring them with you on the day of your procedure. Your physician has requested that you go to www.[email or web address].com and enter the access code given to you at your visit today. This web site gives a general overview about your procedure. However, you should still follow specific instructions given to you by our office regarding your preparation for the procedure.  You have been scheduled for an endoscopy with propofol. Please follow written instructions given to you at your visit today. If you use inhalers (even only as needed), please bring them with you on the day of your procedure. Your physician has requested that you go to www.[email or web address] and enter the access code given to you at your visit today. This web site gives a general overview about your procedure. However, you should still follow specific instructions given to you by our office regarding your preparation for the procedure.

## 2013-08-15 NOTE — Assessment & Plan Note (Signed)
Patient is having loose stools.  Provided no structural abnormalities are seen at colonoscopy will add fiber supplementation.

## 2013-08-15 NOTE — Assessment & Plan Note (Signed)
Patient is symptomatic again from a recurrent stricture.  Plan repeat endoscopy with dilatation as indicated

## 2013-08-15 NOTE — Assessment & Plan Note (Signed)
Plan followup colonoscopy 

## 2013-08-15 NOTE — Progress Notes (Signed)
_                                                                                                                History of Present Illness: Pleasant 58 year old white male with history of colon polyps referred for evaluation of rectal bleeding.  On 2 occasions he has seen blood mixed with his stools.  He denies rectal pain.  He's had some changes in bowel habits.  Bowel movements are preceded by crampy left lower quadrant pain.  After passing a couple of soft stools he may frequently have several loose stools.  An adenomatous polyp was removed 5 years ago.  Patient also complains of dysphagia to solids.  He underwent dilation of a distal esophageal stricture in 2009.    Past Medical History  Diagnosis Date  . COLONIC POLYPS 02/01/2008  . DIABETES MELLITUS, TYPE II 05/20/2010  . HYPERLIPIDEMIA 10/09/2008  . ANXIETY DEPRESSION 02/01/2008  . ERECTILE DYSFUNCTION 10/09/2008  . ADD 10/09/2008  . SLEEP APNEA, OBSTRUCTIVE 02/01/2008  . MORTON'S NEUROMA 05/20/2010  . PERIPHERAL NEUROPATHY 05/20/2010  . Other specified forms of hearing loss 06/27/2009  . HYPERTENSION 10/09/2008  . HEMORRHOIDS 02/01/2008  . ALLERGIC RHINITIS 10/09/2008  . Stricture and stenosis of esophagus 02/02/2008  . GERD 02/01/2008  . HIATAL HERNIA 02/01/2008  . ERECTILE DYSFUNCTION, ORGANIC 05/20/2010  . WRIST PAIN, LEFT 12/05/2009  . FOOT PAIN, LEFT 05/20/2010  . PERIPHERAL EDEMA 05/20/2010  . DYSPNEA 03/12/2010  . Abdominal pain, unspecified site 01/19/2009  . Abdominal pain, left lower quadrant 06/06/2010  . Type II or unspecified type diabetes mellitus without mention of complication, uncontrolled 11/14/2010   Past Surgical History  Procedure Laterality Date  . Carpal tunnel release    . Rotator cuff repair    . Esophageal dilation  july 2009   family history includes Depression in his mother; Diabetes in his brother and mother; Heart disease in his mother; Hyperlipidemia in his mother. There is no history of  Colon cancer. Current Outpatient Prescriptions  Medication Sig Dispense Refill  . amLODipine (NORVASC) 5 MG tablet TAKE 1 TABLET BY MOUTH EVERY DAY  90 tablet  3  . aspirin (ASPIRIN EC) 81 MG EC tablet Take 81 mg by mouth daily.        Marland Kitchen atorvastatin (LIPITOR) 10 MG tablet TAKE 1 TABLET BY MOUTH EVERY DAY  90 tablet  3  . clonazePAM (KLONOPIN) 0.5 MG tablet TAKE 1 TABLET BY MOUTH TWICE DAILY AS NEEDED  60 tablet  2  . DULoxetine (CYMBALTA) 20 MG capsule Take 20 mg by mouth 3 (three) times daily.        Marland Kitchen glucose blood (FREESTYLE TEST STRIPS) test strip Use as instructed  100 each  12  . hydrochlorothiazide (HYDRODIURIL) 25 MG tablet TAKE 1 TABLET BY MOUTH DAILY  90 tablet  3  . metFORMIN (GLUCOPHAGE-XR) 500 MG 24 hr tablet TAKE 1 TABLET BY MOUTH DAILY WITH BREAKFAST  90 tablet  3  . methylphenidate (CONCERTA) 54 MG CR tablet  1 tab by mouth twice per day -  60 tablet  0  . pregabalin (LYRICA) 100 MG capsule Take 100 mg by mouth 3 (three) times daily.         No current facility-administered medications for this visit.   Allergies as of 08/15/2013 - Review Complete 08/15/2013  Allergen Reaction Noted  . Codeine      reports that he has never smoked. He has never used smokeless tobacco. He reports that he drinks alcohol. He reports that he uses illicit drugs (Methaqualone).     Review of Systems: Pertinent positive and negative review of systems were noted in the above HPI section. All other review of systems were otherwise negative.  Vital signs were reviewed in today's medical record Physical Exam: General: Well developed , well nourished, no acute distress Skin: anicteric Head: Normocephalic and atraumatic Eyes:  sclerae anicteric, EOMI Ears: Normal auditory acuity Mouth: No deformity or lesions Neck: Supple, no masses or thyromegaly Lungs: Clear throughout to auscultation Heart: Regular rate and rhythm; no murmurs, rubs or bruits Abdomen: Soft,  and non distended. No masses,  hepatosplenomegaly or hernias noted. Normal Bowel sounds.  There is mild tenderness to palpation in the left lower quadrant Rectal:deferred Musculoskeletal: Symmetrical with no gross deformities  Skin: No lesions on visible extremities Pulses:  Normal pulses noted Extremities: No clubbing, cyanosis, edema or deformities noted Neurological: Alert oriented x 4, grossly nonfocal Cervical Nodes:  No significant cervical adenopathy Inguinal Nodes: No significant inguinal adenopathy Psychological:  Alert and cooperative. Normal mood and affect  See Assessment and Plan under Problem List

## 2013-08-15 NOTE — Assessment & Plan Note (Signed)
2 episodes of painless rectal bleeding.  Symptoms could be do to hemorrhoids.  A more proximal colonic bleeding source should be ruled out.  Recommendations #1 colonoscopy

## 2013-08-16 ENCOUNTER — Ambulatory Visit (AMBULATORY_SURGERY_CENTER): Payer: BC Managed Care – PPO | Admitting: Gastroenterology

## 2013-08-16 ENCOUNTER — Encounter: Payer: Self-pay | Admitting: Gastroenterology

## 2013-08-16 VITALS — BP 129/75 | HR 64 | Temp 96.4°F | Resp 13 | Ht 69.0 in | Wt 207.0 lb

## 2013-08-16 DIAGNOSIS — D126 Benign neoplasm of colon, unspecified: Secondary | ICD-10-CM

## 2013-08-16 DIAGNOSIS — K648 Other hemorrhoids: Secondary | ICD-10-CM

## 2013-08-16 DIAGNOSIS — R198 Other specified symptoms and signs involving the digestive system and abdomen: Secondary | ICD-10-CM

## 2013-08-16 MED ORDER — SODIUM CHLORIDE 0.9 % IV SOLN: 500.0000 mL | INTRAVENOUS | Status: DC

## 2013-08-16 MED ORDER — FLEET ENEMA 7-19 GM/118ML RE ENEM
1.0000 | ENEMA | Freq: Once | RECTAL | Status: AC
  Administered 2013-08-16: 1 via RECTAL

## 2013-08-16 NOTE — Patient Instructions (Signed)
Discharge instructions given with verbal understanding. Handouts on polyps and hemorrhoids given. Resume previous medications. YOU HAD AN ENDOSCOPIC PROCEDURE TODAY AT THE [facility]: Refer to the procedure report that was given to you for any specific questions about what was found during the examination.  If the procedure report does not answer your questions, please call your gastroenterologist to clarify.  If you requested that your care partner not be given the details of your procedure findings, then the procedure report has been included in a sealed envelope for you to review at your convenience later.  YOU SHOULD EXPECT: Some feelings of bloating in the abdomen. Passage of more gas than usual.  Walking can help get rid of the air that was put into your GI tract during the procedure and reduce the bloating. If you had a lower endoscopy (such as a colonoscopy or flexible sigmoidoscopy) you may notice spotting of blood in your stool or on the toilet paper. If you underwent a bowel prep for your procedure, then you may not have a normal bowel movement for a few days.  DIET: Your first meal following the procedure should be a light meal and then it is ok to progress to your normal diet.  A half-sandwich or bowl of soup is an example of a good first meal.  Heavy or fried foods are harder to digest and may make you feel nauseous or bloated.  Likewise meals heavy in dairy and vegetables can cause extra gas to form and this can also increase the bloating.  Drink plenty of fluids but you should avoid alcoholic beverages for 24 hours.  ACTIVITY: Your care partner should take you home directly after the procedure.  You should plan to take it easy, moving slowly for the rest of the day.  You can resume normal activity the day after the procedure however you should NOT DRIVE or use heavy machinery for 24 hours (because of the sedation medicines used during the test).    SYMPTOMS TO REPORT  IMMEDIATELY: A gastroenterologist can be reached at any hour.  During normal business hours, 8:30 AM to 5:00 PM Monday through Friday, call [phone].  After hours and on weekends, please call the GI answering service at [phone] who will take a message and have the physician on call contact you.   Following lower endoscopy (colonoscopy or flexible sigmoidoscopy):  Excessive amounts of blood in the stool  Significant tenderness or worsening of abdominal pains  Swelling of the abdomen that is new, acute  Fever of 100F or higher FOLLOW UP: If any biopsies were taken you will be contacted by phone or by letter within the next 1-3 weeks.  Call your gastroenterologist if you have not heard about the biopsies in 3 weeks.  Our staff will call the home number listed on your records the next business day following your procedure to check on you and address any questions or concerns that you may have at that time regarding the information given to you following your procedure. This is a courtesy call and so if there is no answer at the home number and we have not heard from you through the emergency physician on call, we will assume that you have returned to your regular daily activities without incident.  SIGNATURES/CONFIDENTIALITY: You and/or your care partner have signed paperwork which will be entered into your electronic medical record.  These signatures attest to the fact that that the information above on your After Visit Summary   has been reviewed and is understood.  Full responsibility of the confidentiality of this discharge information lies with you and/or your care-partner.  

## 2013-08-16 NOTE — Progress Notes (Signed)
Report to pacu rn, vss, bbs=clear 

## 2013-08-16 NOTE — Op Note (Signed)
[place]  [address]. [place], [address]   COLONOSCOPY PROCEDURE REPORT  PATIENT: Frank [name], Frank [name]  MR#: [id number] BIRTHDATE: [birth year] , 58  yrs. old GENDER: Male ENDOSCOPIST: Inda Castle, MD REFERRED BY: PROCEDURE DATE:  08/16/2013 PROCEDURE:   Colonoscopy with snare polypectomy and Colonoscopy with cold biopsy polypectomy First Screening Colonoscopy - Avg.  risk and is 50 yrs.  old or older - No.  Prior Negative Screening - Now for repeat screening. N/A  History of Adenoma - Now for follow-up colonoscopy & has been > or = to 3 yrs.  Yes hx of adenoma.  Has been 3 or more years since last colonoscopy.  Polyps Removed Today? Yes. ASA CLASS:   Class II INDICATIONS:Rectal Bleeding, Change in bowel habits, and Patient's personal history of colon polyps.  adenomatous polyp 2010 MEDICATIONS: MAC sedation, administered by CRNA and propofol (Diprivan) 300mg  IV  DESCRIPTION OF PROCEDURE:   After the risks benefits and alternatives of the procedure were thoroughly explained, informed consent was obtained.  A digital rectal exam revealed no abnormalities of the rectum.   The LB [id number]  endoscope was introduced through the anus and advanced to the cecum, which was identified by both the appendix and ileocecal valve. No adverse events experienced.   The quality of the prep was excellent using Suprep  The instrument was then slowly withdrawn as the colon was fully examined.      COLON FINDINGS: A sessile polyp measuring 3 mm in size was found in the ascending colon.  A polypectomy was performed with cold forceps.  The resection was complete and the polyp tissue was completely retrieved.   A pedunculated polyp measuring 10 mm in size was found in the sigmoid colon.  A polypectomy was performed using snare cautery.  The resection was complete and the polyp tissue was completely retrieved.   Internal hemorrhoids were found. Retroflexed views  revealed no abnormalities. The time to cecum=3 minutes 09 seconds.  Withdrawal time=9 minutes 03 seconds.  The scope was withdrawn and the procedure completed. COMPLICATIONS: There were no complications.  ENDOSCOPIC IMPRESSION: 1.   Sessile polyp measuring 3 mm in size was found in the ascending colon; polypectomy was performed with cold forceps 2.   Pedunculated polyp measuring 10 mm in size was found in the sigmoid colon; polypectomy was performed using snare cautery 3.   Internal hemorrhoids  limited rectal bleeding secondary to hemorrhoids  RECOMMENDATIONS: 1.  If the polyp(s) removed today are proven to be adenomatous (pre-cancerous) polyps, you will need a repeat colonoscopy in 5 years.  Otherwise you should continue to follow colorectal cancer screening guidelines for "routine risk" patients with colonoscopy in 10 years.  You will receive a letter within 1-2 weeks with the results of your biopsy as well as final recommendations.  Please call my office if you have not received a letter after 3 weeks. 2.  Continue current medications 3.  High fiber diet with liberal fluid intake. 4.  Call office for follow-up appointment in 1 month   eSigned:  Inda Castle, MD 08/16/2013 9:15 AM   cc: Biagio Borg, MD   PATIENT NAME:  [name], [name] MR#: [id number]

## 2013-08-16 NOTE — Progress Notes (Signed)
Pt given an enema.  Results were yellow liquid.

## 2013-08-16 NOTE — Progress Notes (Signed)
Pt reports "muddy" results from prep.  Pt to have fleets enema per Dr Deatra Ina.

## 2013-08-16 NOTE — Progress Notes (Signed)
Called to room to assist during endoscopic procedure.  Patient ID and intended procedure confirmed with present staff. Received instructions for my participation in the procedure from the performing physician.  

## 2013-08-17 ENCOUNTER — Telehealth: Payer: Self-pay | Admitting: *Deleted

## 2013-08-17 NOTE — Telephone Encounter (Signed)
  Follow up Call-  Call back number 08/16/2013  Post procedure Call Back phone  # [phone]  Permission to leave phone message Yes     Pt at work. Doing fine.

## 2013-08-23 ENCOUNTER — Encounter: Payer: Self-pay | Admitting: Gastroenterology

## 2013-08-31 ENCOUNTER — Telehealth: Payer: Self-pay | Admitting: *Deleted

## 2013-08-31 MED ORDER — METHYLPHENIDATE HCL ER (OSM) 54 MG PO TBCR: EXTENDED_RELEASE_TABLET | ORAL | Status: DC

## 2013-08-31 NOTE — Telephone Encounter (Signed)
Done hardcopy to robin  

## 2013-08-31 NOTE — Telephone Encounter (Signed)
Patient is requesting refill on Concerta. Last OV 05/05/2013. Please advise.

## 2013-08-31 NOTE — Telephone Encounter (Signed)
Called the patient left a detailed message concerta hardcopy is ready for pickup at the front desk.

## 2013-09-19 ENCOUNTER — Encounter: Payer: BC Managed Care – PPO | Admitting: Gastroenterology

## 2013-09-20 ENCOUNTER — Ambulatory Visit: Payer: BC Managed Care – PPO | Admitting: Gastroenterology

## 2013-10-04 ENCOUNTER — Telehealth: Payer: Self-pay | Admitting: Internal Medicine

## 2013-10-04 NOTE — Telephone Encounter (Signed)
Pt would like a refill on rx methylphenidate (CONCERTA)

## 2013-10-05 MED ORDER — METHYLPHENIDATE HCL ER (OSM) 54 MG PO TBCR: EXTENDED_RELEASE_TABLET | ORAL | Status: DC

## 2013-10-05 NOTE — Addendum Note (Signed)
Addended by: Biagio Borg on: 10/05/2013 01:00 PM   Modules accepted: Orders

## 2013-10-05 NOTE — Telephone Encounter (Signed)
Patient informed to pickup hardcopy at the front desk.  Also explained controlled medication contact and UDS to complete.  Contract attached to envelope at front.

## 2013-10-05 NOTE — Telephone Encounter (Signed)
1 mo Done hardcopy to [place]

## 2013-10-05 NOTE — Telephone Encounter (Signed)
Called left message to call back 

## 2013-10-26 ENCOUNTER — Encounter: Payer: Self-pay | Admitting: Internal Medicine

## 2013-11-07 ENCOUNTER — Telehealth: Payer: Self-pay

## 2013-11-07 NOTE — Telephone Encounter (Signed)
The patient called and is hoping to get a refill of his Concerta. Thanks!

## 2013-11-08 MED ORDER — METHYLPHENIDATE HCL ER (OSM) 54 MG PO TBCR: EXTENDED_RELEASE_TABLET | ORAL | Status: DC

## 2013-11-08 NOTE — Telephone Encounter (Signed)
Done hardcopy to robin  

## 2013-11-08 NOTE — Telephone Encounter (Signed)
Called the patient left a detailed message hardcopy is ready for pickup at the front desk. 

## 2013-11-24 ENCOUNTER — Other Ambulatory Visit: Payer: Self-pay | Admitting: Internal Medicine

## 2013-11-24 NOTE — Telephone Encounter (Signed)
Faxed hardcopy to [facility] 

## 2013-11-24 NOTE — Telephone Encounter (Signed)
Done hardcopy to robin  

## 2013-12-14 ENCOUNTER — Telehealth: Payer: Self-pay | Admitting: *Deleted

## 2013-12-14 MED ORDER — METHYLPHENIDATE HCL ER (OSM) 54 MG PO TBCR: EXTENDED_RELEASE_TABLET | ORAL | Status: DC

## 2013-12-14 NOTE — Telephone Encounter (Signed)
Done hardcopy to robin  

## 2013-12-14 NOTE — Telephone Encounter (Signed)
Pt called requesting Concerta refill.  Please advise

## 2013-12-14 NOTE — Telephone Encounter (Signed)
Called the patient left a detailed message hardcopy is ready for pickup at the front desk. 

## 2013-12-28 ENCOUNTER — Ambulatory Visit (INDEPENDENT_AMBULATORY_CARE_PROVIDER_SITE_OTHER): Payer: BC Managed Care – PPO | Admitting: Family Medicine

## 2013-12-28 ENCOUNTER — Other Ambulatory Visit: Payer: Self-pay | Admitting: *Deleted

## 2013-12-28 VITALS — BP 128/77 | HR 106 | Temp 100.1°F | Resp 18 | Ht 67.0 in | Wt 213.2 lb

## 2013-12-28 DIAGNOSIS — L03116 Cellulitis of left lower limb: Secondary | ICD-10-CM

## 2013-12-28 DIAGNOSIS — E119 Type 2 diabetes mellitus without complications: Secondary | ICD-10-CM

## 2013-12-28 DIAGNOSIS — L0390 Cellulitis, unspecified: Secondary | ICD-10-CM

## 2013-12-28 DIAGNOSIS — L0291 Cutaneous abscess, unspecified: Secondary | ICD-10-CM

## 2013-12-28 LAB — POCT CBC
GRANULOCYTE PERCENT: 72 % (ref 37–80)
HEMATOCRIT: 42.9 % — AB (ref 43.5–53.7)
Hemoglobin: 14.3 g/dL (ref 14.1–18.1)
LYMPH, POC: 2.8 (ref 0.6–3.4)
MCH, POC: 31 pg (ref 27–31.2)
MCHC: 33.3 g/dL (ref 31.8–35.4)
MCV: 92.8 fL (ref 80–97)
MID (cbc): 0.8 (ref 0–0.9)
MPV: 8.5 fL (ref 0–99.8)
POC GRANULOCYTE: 9.1 — AB (ref 2–6.9)
POC LYMPH PERCENT: 21.7 %L (ref 10–50)
POC MID %: 6.3 % (ref 0–12)
Platelet Count, POC: 274 10*3/uL (ref 142–424)
RBC: 4.62 M/uL — AB (ref 4.69–6.13)
RDW, POC: 13.4 %
WBC: 12.7 10*3/uL — AB (ref 4.6–10.2)

## 2013-12-28 LAB — GLUCOSE, POCT (MANUAL RESULT ENTRY): POC Glucose: 147 mg/dl — AB (ref 70–99)

## 2013-12-28 MED ORDER — DOXYCYCLINE HYCLATE 100 MG PO CAPS: 100.0000 mg | ORAL_CAPSULE | Freq: Two times a day (BID) | ORAL | Status: DC

## 2013-12-28 NOTE — Progress Notes (Signed)
Discussed with Ms. Carlean Purl, NP and agree with the above.    Christell Faith, MHS, PA-C [facility] [place], [place] [address] [place] [facility] 12/28/2013 12:51 PM

## 2013-12-28 NOTE — Progress Notes (Signed)
Subjective:    Patient ID: Frank [name], male    DOB: [birth year], 58 y.o.   MRN: [id number]  HPI Patient sees Dr. Cathlean Cower for primary care.   Patient presents today with swelling of left foot x 3 days. It has gotten progressively worse. Patient tried to work today, but was unable to due to pain. Patient denies injury prior to swelling starting, but did fall last night. He states he has neuropathy and falls occasionally. He did not injure himself with fall last night. No dizziness, no loss of consciousness. He traveled to the beach this past weekend. No recent air travel.   Has history of what sounds like a fissure on his left foot for which he saw a podiatrist and was given a cream and had to wear a boot. This was last year and resolved completely.   Patient with diabetes, does not check blood sugar at home. Does try to check feet.  Reports sore neck on right side for 1-2 weeks, this has been getting better. No difficulty with movement, no radiation, no numbness or tingling. Thinks this might be related to the way he slept on it.   Review of Systems No chest pain, no shortness of breath, no abdominal pain, no diarrhea/no constipation, felt a little nauseous last night, no fever/chills.    Objective:   Physical Exam  Vitals reviewed. Constitutional: He is oriented to person, place, and time. He appears well-developed and well-nourished.  HENT:  Head: Normocephalic and atraumatic.  Eyes: Conjunctivae are normal. Right eye exhibits no discharge. Left eye exhibits no discharge.  Neck: Normal range of motion. Neck supple.  Neck with full ROM, no mass, no tenderness.  Cardiovascular: Normal rate, regular rhythm and normal heart sounds.   Faint pedal pulses.  Pulmonary/Chest: Effort normal and breath sounds normal.  Lymphadenopathy:    He has no cervical adenopathy.  Neurological: He is alert and oriented to person, place, and time.  Skin: Skin is warm and dry.  Left lateral  ankle/foot with moderate amount swelling, erythema and warmth. No streaking up leg.   Psychiatric: He has a normal mood and affect. His behavior is normal. Judgment and thought content normal.   Calves supple bilaterally.  Results for orders placed in visit on 12/28/13  POCT CBC      Result Value Ref Range   WBC 12.7 (*) 4.6 - 10.2 K/uL   Lymph, poc 2.8  0.6 - 3.4   POC LYMPH PERCENT 21.7  10 - 50 %L   MID (cbc) 0.8  0 - 0.9   POC MID % 6.3  0 - 12 %M   POC Granulocyte 9.1 (*) 2 - 6.9   Granulocyte percent 72.0  37 - 80 %G   RBC 4.62 (*) 4.69 - 6.13 M/uL   Hemoglobin 14.3  14.1 - 18.1 g/dL   HCT, POC 42.9 (*) 43.5 - 53.7 %   MCV 92.8  80 - 97 fL   MCH, POC 31.0  27 - 31.2 pg   MCHC 33.3  31.8 - 35.4 g/dL   RDW, POC 13.4     Platelet Count, POC 274  142 - 424 K/uL   MPV 8.5  0 - 99.8 fL  GLUCOSE, POCT (MANUAL RESULT ENTRY)      Result Value Ref Range   POC Glucose 147 (*) 70 - 99 mg/dl      Assessment & Plan:  1. Cellulitis of leg, left - POCT CBC - doxycycline (VIBRAMYCIN)  100 MG capsule; Take 1 capsule (100 mg total) by mouth 2 (two) times daily.  Dispense: 20 capsule; Refill: 0 - Area of erythema marked with skin marker -Patient to return in 48 hours for recheck, sooner if worsening redness, pain or fever -Patient given verbal and written instructions.  2. Diabetes mellitus, type 2 - POCT glucose (manual entry) -Patient to make an appointment with Dr. Jenny Reichmann for primary care follow up.  Elby Beck, FNP-BC  [facility] [place] [facility]  12/28/2013 12:23 PM

## 2013-12-28 NOTE — Patient Instructions (Signed)
It is important that you finish your antibiotic Please call Dr. Jenny Reichmann and make an appointment to follow up your diabetes, high blood pressure Return in 48 hours for a recheck, sooner if worsening redness or pain Can take ibuprofen 400 mg every 8 hours as needed for pain   Cellulitis Cellulitis is an infection of the skin and the tissue beneath it. The infected area is usually red and tender. Cellulitis occurs most often in the arms and lower legs.  CAUSES  Cellulitis is caused by bacteria that enter the skin through cracks or cuts in the skin. The most common types of bacteria that cause cellulitis are Staphylococcus and Streptococcus. SYMPTOMS   Redness and warmth.  Swelling.  Tenderness or pain.  Fever. DIAGNOSIS  Your caregiver can usually determine what is wrong based on a physical exam. Blood tests may also be done. TREATMENT  Treatment usually involves taking an antibiotic medicine. HOME CARE INSTRUCTIONS   Take your antibiotics as directed. Finish them even if you start to feel better.  Keep the infected arm or leg elevated to reduce swelling.  Apply a warm cloth to the affected area up to 4 times per day to relieve pain.  Only take over-the-counter or prescription medicines for pain, discomfort, or fever as directed by your caregiver.  Keep all follow-up appointments as directed by your caregiver. SEEK MEDICAL CARE IF:   You notice red streaks coming from the infected area.  Your red area gets larger or turns dark in color.  Your bone or joint underneath the infected area becomes painful after the skin has healed.  Your infection returns in the same area or another area.  You notice a swollen bump in the infected area.  You develop new symptoms. SEEK IMMEDIATE MEDICAL CARE IF:   You have a fever.  You feel very sleepy.  You develop vomiting or diarrhea.  You have a general ill feeling (malaise) with muscle aches and pains. MAKE SURE YOU:   Understand  these instructions.  Will watch your condition.  Will get help right away if you are not doing well or get worse. Document Released: 05/07/2005 Document Revised: 01/27/2012 Document Reviewed: 10/13/2011 [name] Patient Information 2014 [place].

## 2013-12-28 NOTE — Telephone Encounter (Signed)
Received fax pt needing PA on his concerta. Completed PA on cover-my-meds. Med was approved notified pharmacy spoke with [place] gave approval status...Frank [name]

## 2013-12-30 ENCOUNTER — Ambulatory Visit (INDEPENDENT_AMBULATORY_CARE_PROVIDER_SITE_OTHER): Payer: BC Managed Care – PPO | Admitting: Physician Assistant

## 2013-12-30 VITALS — BP 134/78 | HR 104 | Temp 98.4°F | Resp 20 | Ht 68.0 in | Wt 210.0 lb

## 2013-12-30 DIAGNOSIS — L02619 Cutaneous abscess of unspecified foot: Secondary | ICD-10-CM

## 2013-12-30 DIAGNOSIS — L03119 Cellulitis of unspecified part of limb: Secondary | ICD-10-CM

## 2013-12-30 LAB — POCT CBC
Granulocyte percent: 59.9 %G (ref 37–80)
HEMATOCRIT: 43 % — AB (ref 43.5–53.7)
HEMOGLOBIN: 13.8 g/dL — AB (ref 14.1–18.1)
Lymph, poc: 2.6 (ref 0.6–3.4)
MCH, POC: 29.9 pg (ref 27–31.2)
MCHC: 32.1 g/dL (ref 31.8–35.4)
MCV: 93 fL (ref 80–97)
MID (cbc): 0.8 (ref 0–0.9)
MPV: 8.3 fL (ref 0–99.8)
POC Granulocyte: 5.1 (ref 2–6.9)
POC LYMPH PERCENT: 30.8 %L (ref 10–50)
POC MID %: 9.3 %M (ref 0–12)
Platelet Count, POC: 242 10*3/uL (ref 142–424)
RBC: 4.62 M/uL — AB (ref 4.69–6.13)
RDW, POC: 12.8 %
WBC: 8.5 10*3/uL (ref 4.6–10.2)

## 2013-12-30 MED ORDER — CLINDAMYCIN HCL 300 MG PO CAPS: 300.0000 mg | ORAL_CAPSULE | Freq: Three times a day (TID) | ORAL | Status: DC

## 2013-12-30 NOTE — Patient Instructions (Signed)
STOP the doxycycline  START the clindamycin - take 1 pill (300mg ) every 8 hours with food and water  Take Tylenol and Advil if needed for pain relief  Elevate the foot as much as possible  See me on Sunday 5/24 between 8am-6pm - if the redness is spreading beyond the purple dotted outline, you need to come in sooner   Cellulitis Cellulitis is an infection of the skin and the tissue beneath it. The infected area is usually red and tender. Cellulitis occurs most often in the arms and lower legs.  CAUSES  Cellulitis is caused by bacteria that enter the skin through cracks or cuts in the skin. The most common types of bacteria that cause cellulitis are Staphylococcus and Streptococcus. SYMPTOMS   Redness and warmth.  Swelling.  Tenderness or pain.  Fever. DIAGNOSIS  Your caregiver can usually determine what is wrong based on a physical exam. Blood tests may also be done. TREATMENT  Treatment usually involves taking an antibiotic medicine. HOME CARE INSTRUCTIONS   Take your antibiotics as directed. Finish them even if you start to feel better.  Keep the infected arm or leg elevated to reduce swelling.  Apply a warm cloth to the affected area up to 4 times per day to relieve pain.  Only take over-the-counter or prescription medicines for pain, discomfort, or fever as directed by your caregiver.  Keep all follow-up appointments as directed by your caregiver. SEEK MEDICAL CARE IF:   You notice red streaks coming from the infected area.  Your red area gets larger or turns dark in color.  Your bone or joint underneath the infected area becomes painful after the skin has healed.  Your infection returns in the same area or another area.  You notice a swollen bump in the infected area.  You develop new symptoms. SEEK IMMEDIATE MEDICAL CARE IF:   You have a fever.  You feel very sleepy.  You develop vomiting or diarrhea.  You have a general ill feeling (malaise) with  muscle aches and pains. MAKE SURE YOU:   Understand these instructions.  Will watch your condition.  Will get help right away if you are not doing well or get worse. Document Released: 05/07/2005 Document Revised: 01/27/2012 Document Reviewed: 10/13/2011 [facility] Patient Information 2014 [place].

## 2013-12-30 NOTE — Progress Notes (Signed)
Subjective:    Patient ID: Frank [name], male    DOB: May 21, 1956, 58 y.o.   MRN: [id number]  HPI   Mr. Spiering is a pleasant 58 yr old male here for follow up on LEFT lower leg/foot cellulitis.  He was initially seen here 2 days ago, started on doxycycline.  He has been taking this as directed.  He continues to have significant pain in the foot.  The foot continues to be swollen.  He has been using ibuprofen for pain relief with little improvement.  He is elevating the leg as much as possible.  He denies fever or chills.    PCP Dr. Cathlean Cower  Review of Systems  Constitutional: Negative for fever and chills.  Respiratory: Negative.   Cardiovascular: Negative.   Musculoskeletal: Positive for arthralgias and gait problem.  Skin: Positive for color change.       Objective:   Physical Exam  Vitals reviewed. Constitutional: He is oriented to person, place, and time. He appears well-developed and well-nourished. No distress.  HENT:  Head: Normocephalic and atraumatic.  Eyes: Conjunctivae are normal. No scleral icterus.  Cardiovascular: Normal rate, regular rhythm and normal heart sounds.   Pulses:      Dorsalis pedis pulses are 2+ on the right side, and 1+ on the left side.       Posterior tibial pulses are 2+ on the right side, and 0 on the left side.  Pulmonary/Chest: Effort normal and breath sounds normal. He has no wheezes. He has no rales.  Musculoskeletal:       Left ankle: He exhibits swelling. Tenderness.       Feet:  Cellulitis of LEFT foot has expanded beyond the outline drawn 48 hours ago; the foot is swollen, erythematous, and warm; 1+ pitting edema to mid-tibia; DP pulse not appreciated but cap refill is <2 sec; ROM is limited due to pain  Neurological: He is alert and oriented to person, place, and time.  Skin: Skin is warm and dry.  Psychiatric: He has a normal mood and affect. His behavior is normal.    Results for orders placed in visit on 12/30/13  POCT CBC   Result Value Ref Range   WBC 8.5  4.6 - 10.2 K/uL   Lymph, poc 2.6  0.6 - 3.4   POC LYMPH PERCENT 30.8  10 - 50 %L   MID (cbc) 0.8  0 - 0.9   POC MID % 9.3  0 - 12 %M   POC Granulocyte 5.1  2 - 6.9   Granulocyte percent 59.9  37 - 80 %G   RBC 4.62 (*) 4.69 - 6.13 M/uL   Hemoglobin 13.8 (*) 14.1 - 18.1 g/dL   HCT, POC 43.0 (*) 43.5 - 53.7 %   MCV 93.0  80 - 97 fL   MCH, POC 29.9  27 - 31.2 pg   MCHC 32.1  31.8 - 35.4 g/dL   RDW, POC 12.8     Platelet Count, POC 242  142 - 424 K/uL   MPV 8.3  0 - 99.8 fL       Assessment & Plan:  Cellulitis of foot - Plan: POCT CBC, clindamycin (CLEOCIN) 300 MG capsule   Mr. Blubaugh is a pleasant 59 yr old male here for recheck of LEFT foot cellulitis.  Unfortunately the cellulitis has expanded beyond the initial outline 2 days ago.  He does not note any significant improvement.  White count is improved (12.7 --> 8.5) with doxy  but would have expected more dramatic improvement by now.  Would like to obtain better strep coverage with addition of amox but pt reports he absolutely cannot tolerate - "it tears me up."  So will d/c doxy and start clinda 300mg  TID.  Continue elevating.  New outline drawn.  Pt to follow up with me on 01/01/14.  He was given explicit instructions to RTC sooner should erythema expand past new outline    E. Natividad Brood MHS, PA-C [facility] 5/22/20155:13 PM

## 2014-01-02 ENCOUNTER — Telehealth: Payer: Self-pay | Admitting: Physician Assistant

## 2014-01-02 NOTE — Telephone Encounter (Signed)
LMVM to CB. 

## 2014-01-02 NOTE — Telephone Encounter (Signed)
Please call pt and see how he is doing.  He was supposed to come in on Sunday 5/24 for recheck of left foot/leg cellulitis.  If he is not drastically improved, he needs to come in today to be evaluated

## 2014-01-05 NOTE — Telephone Encounter (Signed)
Lm for rtn call 

## 2014-01-09 NOTE — Telephone Encounter (Signed)
Left message on machine to call back Unable to reach pt.

## 2014-01-09 NOTE — Telephone Encounter (Signed)
Not sure why this is being sent to me. I signed Debbie's chart. Please try to call patient and have him RTC. I will forward to [place].

## 2014-01-10 ENCOUNTER — Ambulatory Visit: Payer: BC Managed Care – PPO | Admitting: Internal Medicine

## 2014-01-10 NOTE — Telephone Encounter (Signed)
I'm sorry. I meant to send this to [facility]

## 2014-01-10 NOTE — Telephone Encounter (Signed)
Left message to return call 

## 2014-01-11 NOTE — Telephone Encounter (Signed)
Thanks for trying to contact the patient.  Hopefully he is improved.

## 2014-01-12 ENCOUNTER — Encounter: Payer: Self-pay | Admitting: Internal Medicine

## 2014-01-12 ENCOUNTER — Ambulatory Visit (INDEPENDENT_AMBULATORY_CARE_PROVIDER_SITE_OTHER): Payer: BC Managed Care – PPO | Admitting: Internal Medicine

## 2014-01-12 VITALS — BP 112/68 | HR 81 | Temp 98.0°F | Ht 69.0 in | Wt 207.4 lb

## 2014-01-12 DIAGNOSIS — I10 Essential (primary) hypertension: Secondary | ICD-10-CM

## 2014-01-12 DIAGNOSIS — F988 Other specified behavioral and emotional disorders with onset usually occurring in childhood and adolescence: Secondary | ICD-10-CM

## 2014-01-12 DIAGNOSIS — E119 Type 2 diabetes mellitus without complications: Secondary | ICD-10-CM

## 2014-01-12 DIAGNOSIS — M109 Gout, unspecified: Secondary | ICD-10-CM

## 2014-01-12 MED ORDER — METHYLPREDNISOLONE ACETATE 80 MG/ML IJ SUSP
80.0000 mg | Freq: Once | INTRAMUSCULAR | Status: AC
  Administered 2014-01-12: 80 mg via INTRAMUSCULAR

## 2014-01-12 MED ORDER — PREDNISONE 10 MG PO TABS: ORAL_TABLET | ORAL | Status: DC

## 2014-01-12 MED ORDER — METHYLPHENIDATE HCL ER (OSM) 54 MG PO TBCR: EXTENDED_RELEASE_TABLET | ORAL | Status: DC

## 2014-01-12 NOTE — Progress Notes (Signed)
Subjective:    Patient ID: Frank [name], male    DOB: [birth year], 58 y.o.   MRN: [id number]  HPI  Here to f/u, with LLE pain/redness/swelling having seen 2 providers since may 20, tx with 2 different antibotics with second being doxycycline, LLE neg for dvt by dopplers.  Has minimal to little improvement however, No fever, chills or worsening, just not better. No recent trauma.   Pt denies fever, wt loss, night sweats, loss of appetite, or other constitutional symptoms No hx of gout  ADD med working well, cannot give urine specimen for drug testing today. Asks for refill. Pt denies chest pain, increased sob or doe, wheezing, orthopnea, PND, increased LE swelling, palpitations, dizziness or syncope. Past Medical History  Diagnosis Date  . COLONIC POLYPS 02/01/2008  . DIABETES MELLITUS, TYPE II 05/20/2010  . HYPERLIPIDEMIA 10/09/2008  . ANXIETY DEPRESSION 02/01/2008  . ERECTILE DYSFUNCTION 10/09/2008  . ADD 10/09/2008  . SLEEP APNEA, OBSTRUCTIVE 02/01/2008  . MORTON'S NEUROMA 05/20/2010  . PERIPHERAL NEUROPATHY 05/20/2010  . Other specified forms of hearing loss 06/27/2009  . HYPERTENSION 10/09/2008  . HEMORRHOIDS 02/01/2008  . ALLERGIC RHINITIS 10/09/2008  . Stricture and stenosis of esophagus 02/02/2008  . GERD 02/01/2008  . HIATAL HERNIA 02/01/2008  . ERECTILE DYSFUNCTION, ORGANIC 05/20/2010  . WRIST PAIN, LEFT 12/05/2009  . FOOT PAIN, LEFT 05/20/2010  . PERIPHERAL EDEMA 05/20/2010  . DYSPNEA 03/12/2010  . Abdominal pain, unspecified site 01/19/2009  . Abdominal pain, left lower quadrant 06/06/2010  . Type II or unspecified type diabetes mellitus without mention of complication, uncontrolled 11/14/2010   Past Surgical History  Procedure Laterality Date  . Carpal tunnel release    . Rotator cuff repair    . Esophageal dilation  july 2009  . Eye surgery      reports that he has never smoked. He has never used smokeless tobacco. He reports that he drinks alcohol. He reports that he uses illicit  drugs (Methaqualone). family history includes Depression in his mother; Diabetes in his brother and mother; Heart disease in his mother; Hyperlipidemia in his mother. There is no history of Colon cancer. Allergies  Allergen Reactions  . Codeine     REACTION: Itching   Current Outpatient Prescriptions on File Prior to Visit  Medication Sig Dispense Refill  . amLODipine (NORVASC) 5 MG tablet TAKE 1 TABLET BY MOUTH EVERY DAY  90 tablet  3  . aspirin (ASPIRIN EC) 81 MG EC tablet Take 81 mg by mouth daily.        Marland Kitchen atorvastatin (LIPITOR) 10 MG tablet TAKE 1 TABLET BY MOUTH EVERY DAY  90 tablet  3  . clonazePAM (KLONOPIN) 0.5 MG tablet TAKE 1 TABLET BY MOUTH TWICE DAILY AS NEEDED  60 tablet  1  . DULoxetine (CYMBALTA) 20 MG capsule Take 20 mg by mouth 3 (three) times daily.        Marland Kitchen glucose blood (FREESTYLE TEST STRIPS) test strip Use as instructed  100 each  12  . hydrochlorothiazide (HYDRODIURIL) 25 MG tablet TAKE 1 TABLET BY MOUTH DAILY  90 tablet  3  . metFORMIN (GLUCOPHAGE-XR) 500 MG 24 hr tablet TAKE 1 TABLET BY MOUTH DAILY WITH BREAKFAST  90 tablet  3  . methylphenidate 54 MG PO CR tablet 1 tab by mouth twice per day -  60 tablet  0  . NUCYNTA 100 MG TABS       . pregabalin (LYRICA) 100 MG capsule Take 100 mg by mouth 3 (three)  times daily.         No current facility-administered medications on file prior to visit.   Review of Systems  Constitutional: Negative for unusual diaphoresis or other sweats  HENT: Negative for ringing in ear Eyes: Negative for double vision or worsening visual disturbance.  Respiratory: Negative for choking and stridor.   Gastrointestinal: Negative for vomiting or other signifcant bowel change Genitourinary: Negative for hematuria or decreased urine volume.  Musculoskeletal: Negative for other MSK pain or swelling Skin: Negative for color change and worsening wound.  Neurological: Negative for tremors and numbness other than noted  Psychiatric/Behavioral:  Negative for decreased concentration or agitation other than above       Objective:   Physical Exam BP 112/68  Pulse 81  Temp(Src) 98 F (36.7 C) (Oral)  Ht 5\' 9"  (9.562 m)  Wt 207 lb 6 oz (94.065 kg)  BMI 30.61 kg/m2  SpO2 94% VS noted,  Constitutional: Pt appears well-developed, well-nourished.  HENT: Head: NCAT.  Right Ear: External ear normal.  Left Ear: External ear normal.  Eyes: . Pupils are equal, round, and reactive to light. Conjunctivae and EOM are normal Neck: Normal range of motion. Neck supple.  Cardiovascular: Normal rate and regular rhythm.   Pulmonary/Chest: Effort normal and breath sounds normal.  Abd:  Soft, NT, ND, + BS Neurological: Pt is alert. Not confused , motor grossly intact Skin: Skin is warm. No rash Psychiatric: Pt behavior is normal. No agitation.  LLE with diffuse swelling to knee but marked at the ankle specifically with minor erythema, no red streaks, ulcers or drainage; 2+ ankle effusion    Assessment & Plan:

## 2014-01-12 NOTE — Assessment & Plan Note (Signed)
stable overall by history and exam, recent data reviewed with pt, and pt to continue medical treatment as before,  to f/u any worsening symptoms or concerns Lab Results  Component Value Date   HGBA1C 6.4 03/25/2013   To call for cbg > 200 on steroid tx

## 2014-01-12 NOTE — Patient Instructions (Signed)
You had the steroid shot today  Please take all new medication as prescribed  Please continue all other medications as before, and refills have been done if requested. Please have the pharmacy call with any other refills you may need.  Please keep your appointments with your specialists as you may have planned   

## 2014-01-12 NOTE — Assessment & Plan Note (Signed)
Consider change hct for repeat episodes gout, o/w cont same tx

## 2014-01-12 NOTE — Assessment & Plan Note (Signed)
No evidence for cellulitis, and LLE dopplers neg for DVT, now with 2+ ankle effusion he states present from the beginning may 20; this is c/w acute gouty arthritis, first episode.  No further antibx, for depomedrol IM , then predpac asd, consider preventive med for recurrence, check uric acid level next labs

## 2014-01-12 NOTE — Assessment & Plan Note (Signed)
stable overall by history and exam, and pt to continue medical treatment as before,  to f/u any worsening symptoms or concerns 

## 2014-01-12 NOTE — Progress Notes (Signed)
Pre visit review using our clinic review tool, if applicable. No additional management support is needed unless otherwise documented below in the visit note. 

## 2014-02-09 ENCOUNTER — Other Ambulatory Visit: Payer: Self-pay | Admitting: Internal Medicine

## 2014-02-09 NOTE — Telephone Encounter (Signed)
Faxed hardcopy to [facility]. [place]

## 2014-02-09 NOTE — Telephone Encounter (Signed)
Done hardcopy to robin  

## 2014-02-16 ENCOUNTER — Other Ambulatory Visit: Payer: Self-pay | Admitting: *Deleted

## 2014-02-16 MED ORDER — CLONAZEPAM 0.5 MG PO TABS
ORAL_TABLET | ORAL | Status: DC
Start: 2014-02-16 — End: 2014-02-17

## 2014-02-16 MED ORDER — METHYLPHENIDATE HCL ER (OSM) 54 MG PO TBCR: EXTENDED_RELEASE_TABLET | ORAL | Status: DC

## 2014-02-16 NOTE — Telephone Encounter (Signed)
Faxed hardcopy to [facility]. [place]

## 2014-02-16 NOTE — Telephone Encounter (Signed)
Both  Done hardcopy to [facility]

## 2014-02-16 NOTE — Telephone Encounter (Signed)
Left msg on triage requesting refill on his concerta & clonazepam.../lmb

## 2014-02-16 NOTE — Telephone Encounter (Signed)
Called the patient informed to pickup concerta and clonazepam hardcopy.   Note: Did not fax to pharmacy as documented as prescriptions were together printed on one sheet.  Patient picked up hardcopy.

## 2014-02-17 MED ORDER — METHYLPHENIDATE HCL ER (OSM) 54 MG PO TBCR: EXTENDED_RELEASE_TABLET | ORAL | Status: DC

## 2014-02-17 MED ORDER — CLONAZEPAM 0.5 MG PO TABS
ORAL_TABLET | ORAL | Status: DC
Start: 2014-02-17 — End: 2014-04-05

## 2014-02-17 NOTE — Addendum Note (Signed)
Addended by: Sharon Seller B on: 02/17/2014 01:59 PM   Modules accepted: Orders

## 2014-02-17 NOTE — Telephone Encounter (Signed)
Reprinted prescriptions as were not in cabnet when patient came in to pickup and he had not picked up.

## 2014-03-06 ENCOUNTER — Ambulatory Visit (INDEPENDENT_AMBULATORY_CARE_PROVIDER_SITE_OTHER): Payer: BC Managed Care – PPO | Admitting: Physician Assistant

## 2014-03-06 ENCOUNTER — Ambulatory Visit (INDEPENDENT_AMBULATORY_CARE_PROVIDER_SITE_OTHER)
Admission: RE | Admit: 2014-03-06 | Discharge: 2014-03-06 | Disposition: A | Discharge status: Home / Self Care | Payer: BC Managed Care – PPO | Source: Ambulatory Visit | Attending: Physician Assistant | Admitting: Physician Assistant

## 2014-03-06 ENCOUNTER — Encounter: Payer: Self-pay | Admitting: Physician Assistant

## 2014-03-06 ENCOUNTER — Other Ambulatory Visit: Payer: Self-pay | Admitting: Physician Assistant

## 2014-03-06 VITALS — BP 110/80 | HR 78 | Temp 98.6°F | Resp 18 | Wt 200.0 lb

## 2014-03-06 DIAGNOSIS — S99911A Unspecified injury of right ankle, initial encounter: Secondary | ICD-10-CM

## 2014-03-06 DIAGNOSIS — S8990XA Unspecified injury of unspecified lower leg, initial encounter: Secondary | ICD-10-CM

## 2014-03-06 DIAGNOSIS — S99919A Unspecified injury of unspecified ankle, initial encounter: Secondary | ICD-10-CM

## 2014-03-06 DIAGNOSIS — S99929A Unspecified injury of unspecified foot, initial encounter: Secondary | ICD-10-CM

## 2014-03-06 MED ORDER — MELOXICAM 15 MG PO TABS: 15.0000 mg | ORAL_TABLET | Freq: Every day | ORAL | Status: DC

## 2014-03-06 NOTE — Patient Instructions (Addendum)
Go to [facility] now for an Xray. We will call with the results when available.  Mobic once daily for pain and inflammation. Do not take Advil, Aleve, Motrin, Ibuprofen, or Naproxen at the same time as this medication as that can increase the likelihood of adverse effects.  RICE therapy as discussed.   If emergency symptoms discussed during visit developed, seek medical attention immediately.  Followup as needed, or for worsening or persistent symptoms despite treatment.    RICE: Routine Care for Injuries Rest, Ice, Compression, and Elevation (RICE) are often used to care for injuries. HOME CARE  Rest your injury.  Put ice on the injury.  Put ice in a plastic bag.  Place a towel between your skin and the bag.  Leave the ice on for 15-20 minutes, 03-04 times a day. Do this for as long as told by your doctor.  Apply pressure (compression) with an elastic bandage. Remove and reapply the bandage every 3 to 4 hours. Do not wrap the bandage too tight. Wrap the bandage looser if the fingers or toes are puffy (swollen), blue, cold, painful, or lose feeling (numb).  Raise (elevate) your injury. Raise your injury above the heart if you can. GET HELP RIGHT AWAY IF:  You have lasting pain or puffiness.  Your injury is red, weak, or loses feeling.  Your problems get worse, not better, after several days. MAKE SURE YOU:  Understand these instructions.  Will watch your condition.  Will get help right away if you are not doing well or get worse. Document Released: 01/14/2008 Document Revised: 10/20/2011 Document Reviewed: 12/27/2010 [facility] Patient Information 2015 [place], [place]. This information is not intended to replace advice given to you by your health care provider. Make sure you discuss any questions you have with your health care provider.

## 2014-03-06 NOTE — Progress Notes (Signed)
Subjective:    Patient ID: Frank [name], male    DOB: 1956-06-25, 58 y.o.   MRN: [id number]  Fall The accident occurred 12 to 24 hours ago. The fall occurred from a stool (foot gave out when trying to step off of a stool). He fell from a height of 1 to 2 ft. He landed on hard floor. There was no blood loss. The point of impact was the right hip. The pain is present in the right foot. The pain is at a severity of 10/10. The pain is severe. The symptoms are aggravated by ambulation. Associated symptoms include numbness and tingling. Pertinent negatives include no abdominal pain, bowel incontinence, fever, headaches, hearing loss, hematuria, loss of consciousness, nausea, visual change or vomiting. He has tried nothing for the symptoms.      Review of Systems  Constitutional: Negative for fever and chills.  Respiratory: Negative for shortness of breath.   Cardiovascular: Negative for chest pain.  Gastrointestinal: Negative for nausea, vomiting, abdominal pain, diarrhea and bowel incontinence.  Genitourinary: Negative for hematuria.  Musculoskeletal: Positive for gait problem and joint swelling. Negative for back pain, myalgias, neck pain and neck stiffness.  Neurological: Positive for tingling and numbness. Negative for loss of consciousness, syncope and headaches.  All other systems reviewed and are negative.  Past Medical History  Diagnosis Date  . COLONIC POLYPS 02/01/2008  . DIABETES MELLITUS, TYPE II 05/20/2010  . HYPERLIPIDEMIA 10/09/2008  . ANXIETY DEPRESSION 02/01/2008  . ERECTILE DYSFUNCTION 10/09/2008  . ADD 10/09/2008  . SLEEP APNEA, OBSTRUCTIVE 02/01/2008  . MORTON'S NEUROMA 05/20/2010  . PERIPHERAL NEUROPATHY 05/20/2010  . Other specified forms of hearing loss 06/27/2009  . HYPERTENSION 10/09/2008  . HEMORRHOIDS 02/01/2008  . ALLERGIC RHINITIS 10/09/2008  . Stricture and stenosis of esophagus 02/02/2008  . GERD 02/01/2008  . HIATAL HERNIA 02/01/2008  . ERECTILE DYSFUNCTION, ORGANIC  05/20/2010  . WRIST PAIN, LEFT 12/05/2009  . FOOT PAIN, LEFT 05/20/2010  . PERIPHERAL EDEMA 05/20/2010  . DYSPNEA 03/12/2010  . Abdominal pain, unspecified site 01/19/2009  . Abdominal pain, left lower quadrant 06/06/2010  . Type II or unspecified type diabetes mellitus without mention of complication, uncontrolled 11/14/2010    History   Social History  . Marital Status: Single    Spouse Name: N/A    Number of Children: N/A  . 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 Smoking status: Never Smoker   . Smokeless tobacco: Never Used  . Alcohol Use: Yes     Comment: moderately  . Drug Use: Yes    Special: Methaqualone  . Sexual Activity: Not on file   Other Topics Concern  . Not on file   Social History Narrative  . No narrative on file    Past Surgical History  Procedure Laterality Date  . Carpal tunnel release    . Rotator cuff repair    . Esophageal dilation  july 2009  . Eye surgery      Family History  Problem Relation Age of Onset  . Diabetes Mother   . Heart disease Mother   . Hyperlipidemia Mother   . Depression Mother   . Diabetes Brother   . Colon cancer Neg Hx     Allergies  Allergen Reactions  . Codeine     REACTION: Itching    Current Outpatient Prescriptions on File Prior to Visit  Medication Sig Dispense Refill  . amLODipine (NORVASC)  5 MG tablet TAKE 1 TABLET BY MOUTH EVERY DAY  90 tablet  3  . aspirin (ASPIRIN EC) 81 MG EC tablet Take 81 mg by mouth daily.        Marland Kitchen atorvastatin (LIPITOR) 10 MG tablet TAKE 1 TABLET BY MOUTH EVERY DAY  90 tablet  3  . clonazePAM (KLONOPIN) 0.5 MG tablet TAKE 1 TABLET BY MOUTH TWICE DAILY AS NEEDED  60 tablet  1  . DULoxetine (CYMBALTA) 20 MG capsule Take 20 mg by mouth 3 (three) times daily.        Marland Kitchen glucose blood (FREESTYLE TEST STRIPS) test strip Use as instructed  100 each  12  . hydrochlorothiazide (HYDRODIURIL) 25 MG tablet TAKE 1 TABLET BY  MOUTH DAILY  90 tablet  3  . metFORMIN (GLUCOPHAGE-XR) 500 MG 24 hr tablet TAKE 1 TABLET BY MOUTH DAILY WITH BREAKFAST  90 tablet  3  . methylphenidate 54 MG PO CR tablet 1 tab by mouth twice per day -  60 tablet  0  . NUCYNTA 100 MG TABS       . predniSONE (DELTASONE) 10 MG tablet 3 tabs by mouth per day for 3 days,2tabs per day for 3 days,1tab per day for 3 days  18 tablet  0  . pregabalin (LYRICA) 100 MG capsule Take 100 mg by mouth 3 (three) times daily.         No current facility-administered medications on file prior to visit.    EXAM: BP 110/80  Pulse 78  Temp(Src) 98.6 F (37 C) (Oral)  Resp 18  Wt 200 lb (90.719 kg)     Objective:   Physical Exam  Nursing note and vitals reviewed. Constitutional: He is oriented to person, place, and time. He appears well-developed and well-nourished. No distress.  HENT:  Head: Normocephalic and atraumatic.  Eyes: Conjunctivae and EOM are normal. Pupils are equal, round, and reactive to light.  Neck: Normal range of motion.  Cardiovascular: Normal rate, regular rhythm and intact distal pulses.   Pulmonary/Chest: Effort normal and breath sounds normal. No respiratory distress. He exhibits no tenderness.  Musculoskeletal: He exhibits edema and tenderness.  Range of motion limited due to pain. Tenderness to palpation medial and lateral malleoli of right foot. Tenderness palpation over the dorsal aspect of right foot. No proximal fibular head tenderness. Swelling of the right ankle and foot. Gait limping, favoring right ankle.  Neurological: He is alert and oriented to person, place, and time.  Tone, sensation, and reflexes all grossly intact.  Skin: Skin is warm and dry. No rash noted. He is not diaphoretic. No erythema. No pallor.  Bruising noted on anterior medial and lateral aspect of the right ankle. No bruising of the right hip.  Psychiatric: He has a normal mood and affect. His behavior is normal. Judgment and thought content  normal.    Lab Results  Component Value Date   WBC 8.5 12/30/2013   HGB 13.8* 12/30/2013   HCT 43.0* 12/30/2013   PLT 224.0 03/25/2013   GLUCOSE 118* 03/25/2013   CHOL 140 03/25/2013   TRIG 79.0 03/25/2013   HDL 49.50 03/25/2013   LDLDIRECT 95.3 07/15/2011   LDLCALC 75 03/25/2013   ALT 21 03/25/2013   AST 21 03/25/2013   NA 137 03/25/2013   K 4.3 03/25/2013   CL 97 03/25/2013   CREATININE 0.8 03/25/2013   BUN 13 03/25/2013   CO2 35* 03/25/2013   TSH 1.41 03/25/2013   PSA 0.46 03/25/2013  HGBA1C 6.4 03/25/2013   MICROALBUR 0.5 07/16/2012         Assessment & Plan:  Frank [name] was seen today for fall.  Diagnoses and associated orders for this visit:  Ankle injury, right, initial encounter Comments: Xray now. May refer to ortho/Sportsmed. RICE therapy. - DG Foot Complete Right; Future - DG Ankle Complete Right; Future - meloxicam (MOBIC) 15 MG tablet; Take 1 tablet (15 mg total) by mouth daily.    Patient has codeine allergy, will wait to prescribe more effective pain medication until x-rays are completed.  Return precautions provided, and patient handout on RICE therapy.  Plan to follow up as needed, or for worsening or persistent symptoms despite treatment.  Patient Instructions  Go to [facility] now for an Xray. We will call with the results when available.  Mobic once daily for pain and inflammation. Do not take Advil, Aleve, Motrin, Ibuprofen, or Naproxen at the same time as this medication as that can increase the likelihood of adverse effects.  RICE therapy as discussed.   If emergency symptoms discussed during visit developed, seek medical attention immediately.  Followup as needed, or for worsening or persistent symptoms despite treatment.

## 2014-03-06 NOTE — Progress Notes (Signed)
Pre visit review using our clinic review tool, if applicable. No additional management support is needed unless otherwise documented below in the visit note. 

## 2014-03-07 ENCOUNTER — Telehealth: Payer: Self-pay

## 2014-03-07 NOTE — Telephone Encounter (Signed)
Per Rodman Key call pt to advise that her had minminal fracture and when he gets the boot he needs to begin weight bearing.  It is best to start this to prevent foot becoming stiff. There is no reason for pt to be out of work for a long period of time.  Rodman Key will write patient a work note for yesterday, today and tomorrow and pt can return on Thursday 03/02/14. If paperwork can be provided from work that pt cannot wear a boot a work then [employer] paper work can be filled out.  Called and spoke with pt and pt is aware.  Advised pt to contact employer and get a letter.  Pt verbalized understanding.

## 2014-03-09 ENCOUNTER — Telehealth: Payer: Self-pay | Admitting: Internal Medicine

## 2014-03-09 DIAGNOSIS — M79605 Pain in left leg: Secondary | ICD-10-CM

## 2014-03-09 NOTE — Telephone Encounter (Signed)
Pt saw Mat 7/27 for foot swollen. Pt would like to know how long he should wear the boot that was put on him at the [place] office.(by Mendel Ryder) Pt states he was sent home by his employer [employer] bc they told him he could not work w/ the boot on. Pt states his toes are turning purple and his foot is swollen.  Pt 's appt is not until 8/17.  Pt would like to know if any way he could get in sooner to zach smith or go to orthopedic dr. pls advise.

## 2014-03-09 NOTE — Telephone Encounter (Signed)
Per Rodman Key if pt feels boot is too tight pt should be seen at [facility]; if pt feels that the boot is fitting ok but the bruising is moving toward his toes then pt should be seen in the office by Rodman Key.  In order for FMLA paperwork to be filled out a letter from work is needed stating pt cannot wear boot.

## 2014-03-09 NOTE — Telephone Encounter (Signed)
Attempted to call pt again no answer  

## 2014-03-09 NOTE — Telephone Encounter (Signed)
Left a message for return call.  

## 2014-03-10 ENCOUNTER — Encounter: Payer: Self-pay | Admitting: *Deleted

## 2014-03-10 ENCOUNTER — Ambulatory Visit (INDEPENDENT_AMBULATORY_CARE_PROVIDER_SITE_OTHER): Payer: BC Managed Care – PPO | Admitting: Family Medicine

## 2014-03-10 ENCOUNTER — Ambulatory Visit (INDEPENDENT_AMBULATORY_CARE_PROVIDER_SITE_OTHER): Payer: BC Managed Care – PPO

## 2014-03-10 ENCOUNTER — Encounter: Payer: Self-pay | Admitting: Family Medicine

## 2014-03-10 VITALS — BP 146/84 | HR 104 | Wt 200.0 lb

## 2014-03-10 DIAGNOSIS — S8263XA Displaced fracture of lateral malleolus of unspecified fibula, initial encounter for closed fracture: Secondary | ICD-10-CM | POA: Insufficient documentation

## 2014-03-10 DIAGNOSIS — S8261XA Displaced fracture of lateral malleolus of right fibula, initial encounter for closed fracture: Secondary | ICD-10-CM

## 2014-03-10 DIAGNOSIS — M79609 Pain in unspecified limb: Secondary | ICD-10-CM

## 2014-03-10 DIAGNOSIS — M79605 Pain in left leg: Secondary | ICD-10-CM

## 2014-03-10 MED ORDER — DOXYCYCLINE HYCLATE 100 MG PO TABS: 100.0000 mg | ORAL_TABLET | Freq: Two times a day (BID) | ORAL | Status: AC

## 2014-03-10 NOTE — Telephone Encounter (Signed)
Called and spoke with pt and pt states Dr. Thompson Caul office did not tell pt how long to wear the boot. Per patient he was just told to leave and he could not work with the boot on.  Per patient the bruising is now into 4 toes and it is all the way around the bottom and side of his foot.  The foot is still swollen.  Pt states the swelling has not gone down at all.  Advised pt that a note from his employer was needed stating that pt could not wear a boot at work.    Dr. Tamala Julian- Does pt need to be seen sooner? How long does pt need to wear the boot? Is there anything else pt needs to do for bruising?

## 2014-03-10 NOTE — Assessment & Plan Note (Signed)
Discussed with patient at great length. Patient doesn't need to wear the boot on a regular basis. Patient did walk in the clinic today even though he had the boot but did not wear it. We discussed that he cannot wear the boot for driving. Because the patient's anterior shin redness I have decided to treat for potential infection as well. Patient was given a note for work stating that he can only do seated work only for the next 3 weeks. I will fill out patient's FMLA paperwork as well. Discussed with patient about icing as well as elevation and have this could be helpful. Discuss that if the bruising or pain worsens he needs to be seen immediately. Also discussed if any redness worsens he needs to be seen immediately. Patient likely will improve over the course of time but secondary to his peripheral neuropathy healing may be slowing take up to 12 weeks. All questions were answered today.

## 2014-03-10 NOTE — Addendum Note (Signed)
Addended by: Douglass Rivers T on: 03/10/2014 01:27 PM   Modules accepted: Orders

## 2014-03-10 NOTE — Telephone Encounter (Signed)
Spoke with pt, he is coming in to see Dr. Tamala Julian @ 3:30p

## 2014-03-10 NOTE — Telephone Encounter (Signed)
Thanks

## 2014-03-10 NOTE — Telephone Encounter (Signed)
Pt called back. Pt called supervisor. However now supervisor wants a note stating pt has no restrictions  wearing the boot and pt can stand for eight hours. [employer] fax: [phone] Also. pt would like to ask about FMLA.

## 2014-03-10 NOTE — Patient Instructions (Addendum)
Good to see you The bruising is normal but should start getting better next 2 days When sitting need foot above heart.  So really laying down will be better.  Note for work stating seated work only for next 3 weeks.  Ice 10 minutes 2 times a day.  Anytime walking you need the boot.  For the red ness of the skin I will treat you for possible infection.  Take doxycycline 2 times a day for a week.  I will see you again on the 17th or call sooner if anything changes.  Get here a little early and get an xray downstairs 30 minutes before you see me.

## 2014-03-10 NOTE — Progress Notes (Signed)
[name] [address], [place] [address] Phone: [phone] Subjective:    I'm seeing this patient by the request  of:  [facility]  CC: Right foot pain  [id number] Frank [name] is a 58 y.o. male coming in with complaint of patient's 6 days ago was in an accident when he fell off of a stool. He fell from a height of approximately 1-4 feet. Patient states and the impact he had significant amount of pain mostly in the lateral aspect of the right foot. Patient states the pain was severe at 10 out of 10. Patient was seen by provider previously stated above and x-rays were taken. Patient did have a nondisplaced lateral malleolus fracture. Patient was to follow up with me 3 weeks after getting the [place] boot. Patient unfortunately started having more pain and needed to be seen sooner. Patient states that there has been more swelling as well as bruising of the foot. Patient states that the pain does not seem to be getting better. Patient is not taking anything for the pain. Patient has not been icing and is not wearing the boot today. Patient states that he is unable to work with the boot and he was sent home secondary to this. 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 Patient does have numbness in the feet bilaterally secondary to neuropathy. Patient has been treated for this for a long time. Patient denies any fevers or chills or any abnormal weight loss. X-rays were reviewed by me induced show the patient does have a nondisplaced lateral malleolus and only went through approximately 1/3 of the bone.    Past medical history, social, surgical and family history all reviewed in electronic medical record.   Review of Systems: No headache, visual changes, nausea, vomiting, diarrhea, constipation, dizziness, abdominal pain, skin rash, fevers, chills, night sweats, weight loss, swollen lymph nodes, body aches, joint  swelling, muscle aches, chest pain, shortness of breath, mood changes.   Objective Blood pressure 146/84, pulse 104, weight 200 lb (90.719 kg), SpO2 96.00%.  General: No apparent distress alert and oriented x3 mood and affect normal, dressed appropriately.  HEENT: Pupils equal, extraocular movements intact  Respiratory: Patient's speak in full sentences and does not appear short of breath  Cardiovascular: No lower extremity edema, non tender, no erythema  Skin: Warm dry intact with no signs of infection or rash on extremities or on axial skeleton.  Abdomen: Soft nontender  Neuro: Cranial nerves II through XII are intact, neurovascularly intact in all extremities with 2+ DTRs and 2+ pulses.  Lymph: No lymphadenopathy of posterior or anterior cervical chain or axillae bilaterally.  Gait normal with good balance and coordination.  MSK:  Non tender with full range of motion and good stability and symmetric strength and tone of shoulders, elbows, wrist, hip, knees bilaterally. Ankle: Right The patient does have swelling of the foot as well as the distal ankle. More swelling on the lateral aspect. Mild redness noted only on the anterior shin. This is warm to touch. This is somewhere near a what appears to be a scratch and old open  wound. In addition this patient does have resolving bruising that goes down into his toes. Patient does have mild numbness of the toes bilaterally. Patient states that this is his baseline. Range of motion is minimally limited due to the amount of swelling Strength is 4/5 in all directions.  Stable lateral and medial ligaments; squeeze test and kleiger test unremarkable; Talar dome nontender; No pain at base of 5th MT; No tenderness over cuboid; No tenderness over N spot or navicular prominence Severely tender over the lateral malleolus No sign of peroneal tendon subluxations or tenderness to palpation Negative tarsal tunnel tinel's Able to walk 4 steps. Contralateral  foot unremarkable.   Impression and Recommendations:     This case required medical decision making of moderate complexity.

## 2014-03-21 ENCOUNTER — Telehealth: Payer: Self-pay

## 2014-03-21 NOTE — Telephone Encounter (Signed)
Called patient//lmovm to call back to set up CPX/diabetic bundle appointment with Dr. Jenny Reichmann.

## 2014-03-27 ENCOUNTER — Encounter: Payer: Self-pay | Admitting: Family Medicine

## 2014-03-27 ENCOUNTER — Ambulatory Visit (INDEPENDENT_AMBULATORY_CARE_PROVIDER_SITE_OTHER): Payer: BC Managed Care – PPO | Admitting: Family Medicine

## 2014-03-27 ENCOUNTER — Ambulatory Visit (INDEPENDENT_AMBULATORY_CARE_PROVIDER_SITE_OTHER)
Admission: RE | Admit: 2014-03-27 | Discharge: 2014-03-27 | Disposition: A | Discharge status: Home / Self Care | Payer: BC Managed Care – PPO | Source: Ambulatory Visit | Attending: Family Medicine | Admitting: Family Medicine

## 2014-03-27 ENCOUNTER — Encounter: Payer: Self-pay | Admitting: *Deleted

## 2014-03-27 VITALS — BP 130/72 | HR 92 | Ht 69.0 in | Wt 206.0 lb

## 2014-03-27 DIAGNOSIS — S8263XA Displaced fracture of lateral malleolus of unspecified fibula, initial encounter for closed fracture: Secondary | ICD-10-CM

## 2014-03-27 DIAGNOSIS — M109 Gout, unspecified: Secondary | ICD-10-CM

## 2014-03-27 DIAGNOSIS — S8261XA Displaced fracture of lateral malleolus of right fibula, initial encounter for closed fracture: Secondary | ICD-10-CM

## 2014-03-27 MED ORDER — AMLODIPINE BESYLATE 10 MG PO TABS: 10.0000 mg | ORAL_TABLET | Freq: Every day | ORAL | Status: DC

## 2014-03-27 MED ORDER — COLCHICINE 0.6 MG PO TABS: 0.6000 mg | ORAL_TABLET | Freq: Two times a day (BID) | ORAL | Status: DC

## 2014-03-27 MED ORDER — ALLOPURINOL 100 MG PO TABS: 200.0000 mg | ORAL_TABLET | Freq: Every day | ORAL | Status: DC

## 2014-03-27 NOTE — Patient Instructions (Signed)
Good to see you Ice is your friend Stop the HCTZ Increase amlodipine to 10mg  daily Colchicine 2 times daily for next 5 days Allopurinol 100mg  daily for 5 days then 200mg  daily thereafter.  Wear the boot for another 3 weeks. Come out of the boot 2 times daily and move ankle.  Spell the alphabet.  I want to see you again in 3 weeks.    Gout Gout is an inflammatory arthritis caused by a buildup of uric acid crystals in the joints. Uric acid is a chemical that is normally present in the blood. When the level of uric acid in the blood is too high it can form crystals that deposit in your joints and tissues. This causes joint redness, soreness, and swelling (inflammation). Repeat attacks are common. Over time, uric acid crystals can form into masses (tophi) near a joint, destroying bone and causing disfigurement. Gout is treatable and often preventable. CAUSES  The disease begins with elevated levels of uric acid in the blood. Uric acid is produced by your body when it breaks down a naturally found substance called purines. Certain foods you eat, such as meats and fish, contain high amounts of purines. Causes of an elevated uric acid level include:  Being passed down from parent to child (heredity).  Diseases that cause increased uric acid production (such as obesity, psoriasis, and certain cancers).  Excessive alcohol use.  Diet, especially diets rich in meat and seafood.  Medicines, including certain cancer-fighting medicines (chemotherapy), water pills (diuretics), and aspirin.  Chronic kidney disease. The kidneys are no longer able to remove uric acid well.  Problems with metabolism. Conditions strongly associated with gout include:  Obesity.  High blood pressure.  High cholesterol.  Diabetes. Not everyone with elevated uric acid levels gets gout. It is not understood why some people get gout and others do not. Surgery, joint injury, and eating too much of certain foods are some of  the factors that can lead to gout attacks. SYMPTOMS   An attack of gout comes on quickly. It causes intense pain with redness, swelling, and warmth in a joint.  Fever can occur.  Often, only one joint is involved. Certain joints are more commonly involved:  Base of the big toe.  Knee.  Ankle.  Wrist.  Finger. Without treatment, an attack usually goes away in a few days to weeks. Between attacks, you usually will not have symptoms, which is different from many other forms of arthritis. DIAGNOSIS  Your caregiver will suspect gout based on your symptoms and exam. In some cases, tests may be recommended. The tests may include:  Blood tests.  Urine tests.  X-rays.  Joint fluid exam. This exam requires a needle to remove fluid from the joint (arthrocentesis). Using a microscope, gout is confirmed when uric acid crystals are seen in the joint fluid. TREATMENT  There are two phases to gout treatment: treating the sudden onset (acute) attack and preventing attacks (prophylaxis).  Treatment of an Acute Attack.  Medicines are used. These include anti-inflammatory medicines or steroid medicines.  An injection of steroid medicine into the affected joint is sometimes necessary.  The painful joint is rested. Movement can worsen the arthritis.  You may use warm or cold treatments on painful joints, depending which works best for you.  Treatment to Prevent Attacks.  If you suffer from frequent gout attacks, your caregiver may advise preventive medicine. These medicines are started after the acute attack subsides. These medicines either help your kidneys eliminate uric  acid from your body or decrease your uric acid production. You may need to stay on these medicines for a very long time.  The early phase of treatment with preventive medicine can be associated with an increase in acute gout attacks. For this reason, during the first few months of treatment, your caregiver may also advise  you to take medicines usually used for acute gout treatment. Be sure you understand your caregiver's directions. Your caregiver may make several adjustments to your medicine dose before these medicines are effective.  Discuss dietary treatment with your caregiver or dietitian. Alcohol and drinks high in sugar and fructose and foods such as meat, poultry, and seafood can increase uric acid levels. Your caregiver or dietitian can advise you on drinks and foods that should be limited. HOME CARE INSTRUCTIONS   Do not take aspirin to relieve pain. This raises uric acid levels.  Only take over-the-counter or prescription medicines for pain, discomfort, or fever as directed by your caregiver.  Rest the joint as much as possible. When in bed, keep sheets and blankets off painful areas.  Keep the affected joint raised (elevated).  Apply warm or cold treatments to painful joints. Use of warm or cold treatments depends on which works best for you.  Use crutches if the painful joint is in your leg.  Drink enough fluids to keep your urine clear or pale yellow. This helps your body get rid of uric acid. Limit alcohol, sugary drinks, and fructose drinks.  Follow your dietary instructions. Pay careful attention to the amount of protein you eat. Your daily diet should emphasize fruits, vegetables, whole grains, and fat-free or low-fat milk products. Discuss the use of coffee, vitamin C, and cherries with your caregiver or dietitian. These may be helpful in lowering uric acid levels.  Maintain a healthy body weight. SEEK MEDICAL CARE IF:   You develop diarrhea, vomiting, or any side effects from medicines.  You do not feel better in 24 hours, or you are getting worse. SEEK IMMEDIATE MEDICAL CARE IF:   Your joint becomes suddenly more tender, and you have chills or a fever. MAKE SURE YOU:   Understand these instructions.  Will watch your condition.  Will get help right away if you are not doing well  or get worse. Document Released: 07/25/2000 Document Revised: 12/12/2013 Document Reviewed: 03/10/2012 [facility] Patient Information 2015 [place], [place]. This information is not intended to replace advice given to you by your health care provider. Make sure you discuss any questions you have with your health care provider.

## 2014-03-27 NOTE — Assessment & Plan Note (Signed)
I believe this patient's continued swelling could be associated with his gout. Patient does not appear to be ill at any time. Patient once again denies any fevers or chills. Patient has been treated with doxycycline previously as well. We will start him on gout medication. Please see in patient instructions. Patient was given a handout as well. Patient and will come back again in 3 weeks for come back sooner if has any more difficulty. Change to hypertensive medications to avoid hydrochlorothiazide. Will discuss with primary care provider.

## 2014-03-27 NOTE — Assessment & Plan Note (Signed)
Patient encouraged to continue the Cam Walker for another 3 weeks. We discussed an icing protocol that could be beneficial. We also discussed coming out of the boot to times a day to do some range of motion exercises with his ankle. Patient will continue this as well as seated work only if it is possible. Otherwise patient will have to stay out of work another 3 weeks. Patient verbalized understanding.

## 2014-03-27 NOTE — Progress Notes (Signed)
  [name] [name], [place] [address] Phone: [phone] Subjective:    CC: Right foot pain.   IRC:[id number] Frank [name] is a 58 y.o. male coming in for followup of a lateral malleolus fracture. Patient was put in a [employer] he continues to wear. Patient states that he continues to have swelling as well as pain in this area. Patient states he did do better for some time and didn't seem to worsen again. Patient states that he is even sorer to light touch. Patient denies any fevers or chills or any abnormal weight loss. Denies any signs of infection. Patient though does have a past medical history significant for peripheral neuropathy. Patient does take Lyrica for this. Patient has not been working because they do not have light duty. We also treated him for the possibility of her cellulitis. Patient states though that he finish the medication with no significant improvement during that time.  Repeat x-rays were ordered but interpreted by me today. Patient continues to have a full thickness nondisplaced lateral malleolus fracture. No significant changes. It does appear to have some reabsorption. Seems to be healing as scheduled.    Past medical history, social, surgical and family history all reviewed in electronic medical record.   Review of Systems: No headache, visual changes, nausea, vomiting, diarrhea, constipation, dizziness, abdominal pain, skin rash, fevers, chills, night sweats, weight loss, swollen lymph nodes, body aches, joint swelling, muscle aches, chest pain, shortness of breath, mood changes.   Objective Blood pressure 130/72, pulse 92, height 5\' 9"  (1.753 m), weight 206 lb (93.441 kg), SpO2 96.00%.  General: No apparent distress alert and oriented x3 mood and affect normal, dressed appropriately.  HEENT: Pupils equal, extraocular movements intact  Respiratory: Patient's speak in full sentences and does not appear short of breath    Cardiovascular: No lower extremity edema, non tender, no erythema  Skin: Warm dry intact with no signs of infection or rash on extremities or on axial skeleton.  Abdomen: Soft nontender  Neuro: Cranial nerves II through XII are intact, neurovascularly intact in all extremities with 2+ DTRs and 2+ pulses.  Lymph: No lymphadenopathy of posterior or anterior cervical chain or axillae bilaterally.  Gait normal with good balance and coordination.  MSK:  Non tender with full range of motion and good stability and symmetric strength and tone of shoulders, elbows, wrist, hip, knees bilaterally. Ankle: Right Continue swelling on the lateral aspect This is warm to touch.  In addition this patient does have resolving bruising that goes down into his toes. Patient does have mild numbness of the toes bilaterally. Patient states that this is his baseline. Range of motion is minimally limited due to the amount of swelling Strength is 4/5 in all directions. Stable lateral and medial ligaments; squeeze test and kleiger test unremarkable; Talar dome nontender; No pain at base of 5th MT; No tenderness over cuboid; No tenderness over N spot or navicular prominence Severely tender over the lateral malleolus No sign of peroneal tendon subluxations or tenderness to palpation Negative tarsal tunnel tinel's Able to walk 4 steps. Contralateral foot unremarkable.   Impression and Recommendations:     This case required medical decision making of moderate complexity.

## 2014-04-03 ENCOUNTER — Telehealth: Payer: Self-pay | Admitting: *Deleted

## 2014-04-03 ENCOUNTER — Encounter: Payer: Self-pay | Admitting: *Deleted

## 2014-04-03 NOTE — Telephone Encounter (Signed)
Error

## 2014-04-05 ENCOUNTER — Telehealth: Payer: Self-pay | Admitting: Internal Medicine

## 2014-04-05 MED ORDER — METHYLPHENIDATE HCL ER (OSM) 54 MG PO TBCR: EXTENDED_RELEASE_TABLET | ORAL | Status: DC

## 2014-04-05 MED ORDER — CLONAZEPAM 0.5 MG PO TABS: ORAL_TABLET | ORAL | Status: DC

## 2014-04-05 NOTE — Telephone Encounter (Signed)
Called the patient left detailed message to pickup both hardcopy's at the front desk

## 2014-04-05 NOTE — Telephone Encounter (Signed)
Pt is requesting re-fills on the following: clonazePAM (KLONOPIN) 0.5 MG tablet methylphenidate 54 MG PO CR tablet

## 2014-04-05 NOTE — Telephone Encounter (Signed)
Done hardcopy to robin - both 

## 2014-04-12 DIAGNOSIS — Z0279 Encounter for issue of other medical certificate: Secondary | ICD-10-CM

## 2014-04-18 ENCOUNTER — Ambulatory Visit (INDEPENDENT_AMBULATORY_CARE_PROVIDER_SITE_OTHER): Payer: BC Managed Care – PPO | Admitting: Family Medicine

## 2014-04-18 ENCOUNTER — Encounter: Payer: Self-pay | Admitting: Family Medicine

## 2014-04-18 ENCOUNTER — Encounter: Payer: Self-pay | Admitting: *Deleted

## 2014-04-18 ENCOUNTER — Other Ambulatory Visit: Payer: BC Managed Care – PPO

## 2014-04-18 VITALS — BP 134/80 | HR 117 | Ht 69.0 in | Wt 199.0 lb

## 2014-04-18 DIAGNOSIS — S8263XA Displaced fracture of lateral malleolus of unspecified fibula, initial encounter for closed fracture: Secondary | ICD-10-CM

## 2014-04-18 DIAGNOSIS — S8261XA Displaced fracture of lateral malleolus of right fibula, initial encounter for closed fracture: Secondary | ICD-10-CM

## 2014-04-18 NOTE — Progress Notes (Signed)
  [name] [address], [place] [address] Phone: [phone] Subjective:    CC: Right foot pain.   OTL:[id number] Frank [name] is a 58 y.o. male coming in for followup of a lateral malleolus fracture. Patient has discontinued the Cam Walker on his own. Patient states that the pain though is still there. Patient continues to have the swelling as well. Patient continues to take of the same medications including the vitamin D and allopurinol. Patient states that he is still unable to work. Denies any new symptoms just continued previous symptoms.   Past medical history, social, surgical and family history all reviewed in electronic medical record.   Review of Systems: No headache, visual changes, nausea, vomiting, diarrhea, constipation, dizziness, abdominal pain, skin rash, fevers, chills, night sweats, weight loss, swollen lymph nodes, body aches, joint swelling, muscle aches, chest pain, shortness of breath, mood changes.   Objective Blood pressure 134/80, pulse 117, height 5\' 9"  (1.753 m), weight 199 lb (90.266 kg), SpO2 97.00%.  General: No apparent distress alert and oriented x3 mood and affect normal, dressed appropriately.  HEENT: Pupils equal, extraocular movements intact  Respiratory: Patient's speak in full sentences and does not appear short of breath  Cardiovascular: No lower extremity edema, non tender, no erythema  Skin: Warm dry intact with no signs of infection or rash on extremities or on axial skeleton.  Abdomen: Soft nontender  Neuro: Cranial nerves II through XII are intact, neurovascularly intact in all extremities with 2+ DTRs and 2+ pulses.  Lymph: No lymphadenopathy of posterior or anterior cervical chain or axillae bilaterally.  Gait normal with good balance and coordination.  MSK:  Non tender with full range of motion and good stability and symmetric strength and tone of shoulders, elbows, wrist, hip, knees  bilaterally. Ankle: Right Continue swelling on the lateral aspect not warm to touch.continued peripheral neuropathy bilaterally. Patient states that this is his baseline. Range of motion is minimally limited due to the amount of swelling Strength is 4/5 in all directions. Stable lateral and medial ligaments; squeeze test and kleiger test unremarkable; Talar dome nontender; No pain at base of 5th MT; No tenderness over cuboid; No tenderness over N spot or navicular prominence Severely tender over the lateral malleolus No sign of peroneal tendon subluxations or tenderness to palpation Negative tarsal tunnel tinel's Able to walk 4 steps. Contralateral foot unremarkable.  MSK US performed of: right ankle.  This study was ordered, performed, and interpreted by Charlann Boxer D.O.  Foot/Ankle:   All structures visualized.   Talar dome unremarkable  Ankle mortise without effusion. Lateral malleolus does have some healing noted. Mild callus formation noted. Still hypoechoic changes noted. Significant soft tissue swelling. Peroneus longus and brevis tendons unremarkable on long and transverse views without sheath effusions. Posterior tibialis, flexor hallucis longus, and flexor digitorum longus tendons unremarkable on long and transverse views without sheath effusions. Achilles tendon visualized along length of tendon and unremarkable on long and transverse views without sheath effusion. Anterior Talofibular Ligament and Calcaneofibular Ligaments unremarkable and intact. Deltoid Ligament unremarkable and intact. Plantar fascia intact and without effusion, normal thickness. No increased doppler signal, cap sign, or thickening of tibial cortex. Power doppler signal normal.  IMPRESSION:  Continued healing lateral malleolus.     Impression and Recommendations:     This case required medical decision making of moderate complexity.

## 2014-04-18 NOTE — Assessment & Plan Note (Signed)
The patient is healing but relatively slowly secondary to his comorbidities including the peripheral neuropathy as well as the diabetes. Patient told at this point with him ambulating in today on his own strength I do feel that he could work part-time 4 hours daily. Patient was given a note to this effect. We discussed icing as well as compression sleeve. Patient states that he has one at home. Patient is going to wear this on a regular basis. Patient does have past medical history significant for peripheral edema and this is likely to somewhat near his baseline. We discussed I would like patient to start to be more active and was given home exercise program ensure proper technique. Patient come back again in 3 weeks and hope at that time we'll advance him to complete full duty work. If he continues to have difficulty then we will need to consider further imaging including an MRI.

## 2014-04-18 NOTE — Patient Instructions (Signed)
Very good to see you.  Ice is your friend Start a compression sleeve and leave on daily.  Please walk when you can.  We will see if you can get back to work in a week and part time.  Come back in 3 weeks.

## 2014-05-03 ENCOUNTER — Telehealth: Payer: Self-pay | Admitting: Internal Medicine

## 2014-05-03 DIAGNOSIS — R7302 Impaired glucose tolerance (oral): Secondary | ICD-10-CM

## 2014-05-03 DIAGNOSIS — Z0000 Encounter for general adult medical examination without abnormal findings: Secondary | ICD-10-CM

## 2014-05-03 NOTE — Telephone Encounter (Signed)
Got scheduled for this coming Tuesday

## 2014-05-03 NOTE — Telephone Encounter (Signed)
Due for cpx with labs now - I will order labs  OK to work pt in at his convenience

## 2014-05-03 NOTE — Telephone Encounter (Signed)
Patient would like a call back in regards to when he needs to have his next lab work done.

## 2014-05-04 ENCOUNTER — Other Ambulatory Visit (INDEPENDENT_AMBULATORY_CARE_PROVIDER_SITE_OTHER): Payer: BC Managed Care – PPO

## 2014-05-04 DIAGNOSIS — R7302 Impaired glucose tolerance (oral): Secondary | ICD-10-CM

## 2014-05-04 DIAGNOSIS — M109 Gout, unspecified: Secondary | ICD-10-CM

## 2014-05-04 DIAGNOSIS — Z0000 Encounter for general adult medical examination without abnormal findings: Secondary | ICD-10-CM

## 2014-05-04 DIAGNOSIS — R7309 Other abnormal glucose: Secondary | ICD-10-CM

## 2014-05-04 LAB — CBC WITH DIFFERENTIAL/PLATELET
Basophils Absolute: 0.1 10*3/uL (ref 0.0–0.1)
Basophils Relative: 0.6 % (ref 0.0–3.0)
EOS ABS: 0.1 10*3/uL (ref 0.0–0.7)
Eosinophils Relative: 1.1 % (ref 0.0–5.0)
HEMATOCRIT: 41.2 % (ref 39.0–52.0)
Hemoglobin: 14 g/dL (ref 13.0–17.0)
Lymphocytes Relative: 29 % (ref 12.0–46.0)
Lymphs Abs: 2.5 10*3/uL (ref 0.7–4.0)
MCHC: 34.1 g/dL (ref 30.0–36.0)
MCV: 91.3 fl (ref 78.0–100.0)
MONO ABS: 0.7 10*3/uL (ref 0.1–1.0)
Monocytes Relative: 7.9 % (ref 3.0–12.0)
NEUTROS ABS: 5.2 10*3/uL (ref 1.4–7.7)
Neutrophils Relative %: 61.4 % (ref 43.0–77.0)
Platelets: 307 10*3/uL (ref 150.0–400.0)
RBC: 4.51 Mil/uL (ref 4.22–5.81)
RDW: 13.8 % (ref 11.5–15.5)
WBC: 8.5 10*3/uL (ref 4.0–10.5)

## 2014-05-04 LAB — BASIC METABOLIC PANEL
BUN: 13 mg/dL (ref 6–23)
CALCIUM: 9.4 mg/dL (ref 8.4–10.5)
CO2: 31 meq/L (ref 19–32)
Chloride: 100 mEq/L (ref 96–112)
Creatinine, Ser: 1 mg/dL (ref 0.4–1.5)
GFR: 84.71 mL/min (ref >60.00)
Glucose, Bld: 104 mg/dL — ABNORMAL HIGH (ref 70–99)
POTASSIUM: 4.2 meq/L (ref 3.5–5.1)
SODIUM: 138 meq/L (ref 135–145)

## 2014-05-04 LAB — HEPATIC FUNCTION PANEL
ALK PHOS: 83 U/L (ref 39–117)
ALT: 18 U/L (ref 0–53)
AST: 19 U/L (ref 0–37)
Albumin: 4.3 g/dL (ref 3.5–5.2)
BILIRUBIN DIRECT: 0.1 mg/dL (ref 0.0–0.3)
Total Bilirubin: 0.4 mg/dL (ref 0.2–1.2)
Total Protein: 7.4 g/dL (ref 6.0–8.3)

## 2014-05-04 LAB — LIPID PANEL
Cholesterol: 209 mg/dL — ABNORMAL HIGH (ref 0–200)
HDL: 48.6 mg/dL (ref >39.00)
LDL Cholesterol: 130 mg/dL — ABNORMAL HIGH (ref 0–99)
NONHDL: 160.4
Total CHOL/HDL Ratio: 4
Triglycerides: 151 mg/dL — ABNORMAL HIGH (ref 0.0–149.0)
VLDL: 30.2 mg/dL (ref 0.0–40.0)

## 2014-05-04 LAB — URINALYSIS, ROUTINE W REFLEX MICROSCOPIC
BILIRUBIN URINE: NEGATIVE
Hgb urine dipstick: NEGATIVE
KETONES UR: NEGATIVE
Leukocytes, UA: NEGATIVE
Nitrite: NEGATIVE
RBC / HPF: NONE SEEN (ref 0–?)
Specific Gravity, Urine: 1.01 (ref 1.000–1.030)
Total Protein, Urine: NEGATIVE
URINE GLUCOSE: NEGATIVE
Urobilinogen, UA: 0.2 (ref 0.0–1.0)
WBC, UA: NONE SEEN (ref 0–?)
pH: 6.5 (ref 5.0–8.0)

## 2014-05-04 LAB — URIC ACID: Uric Acid, Serum: 3.5 mg/dL — ABNORMAL LOW (ref 4.0–7.8)

## 2014-05-04 LAB — HEMOGLOBIN A1C: HEMOGLOBIN A1C: 6.2 % (ref 4.6–6.5)

## 2014-05-04 LAB — PSA: PSA: 0.99 ng/mL (ref 0.10–4.00)

## 2014-05-04 LAB — TSH: TSH: 2.77 u[IU]/mL (ref 0.35–4.50)

## 2014-05-08 ENCOUNTER — Ambulatory Visit: Payer: BC Managed Care – PPO | Admitting: Family Medicine

## 2014-05-09 ENCOUNTER — Encounter: Payer: BC Managed Care – PPO | Admitting: Internal Medicine

## 2014-05-12 ENCOUNTER — Ambulatory Visit: Payer: BC Managed Care – PPO | Admitting: Family Medicine

## 2014-05-17 ENCOUNTER — Encounter: Payer: Self-pay | Admitting: Internal Medicine

## 2014-05-17 ENCOUNTER — Ambulatory Visit (INDEPENDENT_AMBULATORY_CARE_PROVIDER_SITE_OTHER): Payer: BC Managed Care – PPO | Admitting: Internal Medicine

## 2014-05-17 VITALS — BP 134/82 | HR 109 | Temp 98.5°F | Wt 200.0 lb

## 2014-05-17 DIAGNOSIS — M545 Low back pain, unspecified: Secondary | ICD-10-CM | POA: Insufficient documentation

## 2014-05-17 DIAGNOSIS — L989 Disorder of the skin and subcutaneous tissue, unspecified: Secondary | ICD-10-CM

## 2014-05-17 DIAGNOSIS — Z23 Encounter for immunization: Secondary | ICD-10-CM

## 2014-05-17 DIAGNOSIS — Z113 Encounter for screening for infections with a predominantly sexual mode of transmission: Secondary | ICD-10-CM

## 2014-05-17 DIAGNOSIS — M5442 Lumbago with sciatica, left side: Secondary | ICD-10-CM

## 2014-05-17 DIAGNOSIS — Z0000 Encounter for general adult medical examination without abnormal findings: Secondary | ICD-10-CM

## 2014-05-17 DIAGNOSIS — E1140 Type 2 diabetes mellitus with diabetic neuropathy, unspecified: Secondary | ICD-10-CM

## 2014-05-17 MED ORDER — GLIPIZIDE ER 2.5 MG PO TB24: 2.5000 mg | ORAL_TABLET | Freq: Every day | ORAL | Status: DC

## 2014-05-17 NOTE — Assessment & Plan Note (Signed)
Currently metformin intolerant, to d/c this, o/w stable overall by history and exam, recent data reviewed with pt, and pt to start glipizide ER 2.5 qd,  to f/u any worsening symptoms or concerns Lab Results  Component Value Date   HGBA1C 6.2 05/04/2014

## 2014-05-17 NOTE — Patient Instructions (Addendum)
You had the flu shot today  Please make a Nurse Visit appointment for 2 weeks for the new Prevnar pneumonia shot  OK to stop the metformin  Please take all new medication as prescribed  - the glipizide ER 2.5 mg per day  Please continue all other medications as before, and refills have been done if requested.  Please have the pharmacy call with any other refills you may need.  Please continue your efforts at being more active, low cholesterol diet, and weight control.  You are otherwise up to date with prevention measures today.  Please keep your appointments with your specialists as you may have planned  Please go to the LAB in the Basement (turn left off the elevator) for the tests to be done at your convenience, when you are able  You will be contacted by phone if any changes need to be made immediately.  Otherwise, you will receive a letter about your results with an explanation, but please check with MyChart first.  Please remember to sign up for MyChart if you have not done so, as this will be important to you in the future with finding out test results, communicating by private email, and scheduling acute appointments online when needed.  Please return in 6 months, or sooner if needed, with Lab testing done 3-5 days before

## 2014-05-17 NOTE — Assessment & Plan Note (Signed)

## 2014-05-17 NOTE — Assessment & Plan Note (Signed)
To f/u neurology

## 2014-05-17 NOTE — Progress Notes (Signed)
Pre visit review using our clinic review tool, if applicable. No additional management support is needed unless otherwise documented below in the visit note. 

## 2014-05-17 NOTE — Assessment & Plan Note (Signed)
Ok for derm referral full body check

## 2014-05-17 NOTE — Progress Notes (Signed)
Subjective:    Patient ID: Frank [name], male    DOB: 03-12-1956, 58 y.o.   MRN: [id number]  HPI  Here for wellness and f/u;  Overall doing ok;  Pt denies CP, worsening SOB, DOE, wheezing, orthopnea, PND, worsening LE edema, palpitations, dizziness or syncope.  Pt denies neurological change such as new headache, facial or extremity weakness.  Pt denies polydipsia, polyuria, or low sugar symptoms. Pt states overall good compliance with treatment and medications, good tolerability, and has been trying to follow lower cholesterol diet.  Pt denies worsening depressive symptoms, suicidal ideation or panic. No fever, night sweats, wt loss, loss of appetite, or other constitutional symptoms.  Pt states good ability with ADL's, has low fall risk, home safety reviewed and adequate, no other significant changes in hearing or vision, and only occasionally active with exercise.  Does mention GI upset with diarrhea.   Has ongoing chronic neuropathy with pain, on cymbalta and lyrica per neurology. [facility] due to this with right ankle fx, for /fu with Dr Tamala Julian in 2 days, still swollen with pain.  At end visit, pt asks for HIV , denies std symptoms or specific exposure.  Pt continues to have recurring mid LBP without change in severity, no bowel or bladder change, fever, wt loss,  worsening LE pain/numbness/weakness, gait change or falls, except to occas radiate towards left knee, sees neurology Past Medical History  Diagnosis Date  . COLONIC POLYPS 02/01/2008  . DIABETES MELLITUS, TYPE II 05/20/2010  . HYPERLIPIDEMIA 10/09/2008  . ANXIETY DEPRESSION 02/01/2008  . ERECTILE DYSFUNCTION 10/09/2008  . ADD 10/09/2008  . SLEEP APNEA, OBSTRUCTIVE 02/01/2008  . MORTON'S NEUROMA 05/20/2010  . PERIPHERAL NEUROPATHY 05/20/2010  . Other specified forms of hearing loss 06/27/2009  . HYPERTENSION 10/09/2008  . HEMORRHOIDS 02/01/2008  . ALLERGIC RHINITIS 10/09/2008  . Stricture and stenosis of esophagus 02/02/2008  . GERD 02/01/2008  .  HIATAL HERNIA 02/01/2008  . ERECTILE DYSFUNCTION, ORGANIC 05/20/2010  . WRIST PAIN, LEFT 12/05/2009  . FOOT PAIN, LEFT 05/20/2010  . PERIPHERAL EDEMA 05/20/2010  . DYSPNEA 03/12/2010  . Abdominal pain, unspecified site 01/19/2009  . Abdominal pain, left lower quadrant 06/06/2010  . Type II or unspecified type diabetes mellitus without mention of complication, uncontrolled 11/14/2010   Past Surgical History  Procedure Laterality Date  . Carpal tunnel release    . Rotator cuff repair    . Esophageal dilation  july 2009  . Eye surgery      reports that he has never smoked. He has never used smokeless tobacco. He reports that he drinks alcohol. He reports that he uses illicit drugs (Methaqualone). family history includes Depression in his mother; Diabetes in his brother and mother; Heart disease in his mother; Hyperlipidemia in his mother. There is no history of Colon cancer. Allergies  Allergen Reactions  . Codeine     REACTION: Itching  . Metformin And Related     GI upset, diarrhea   Current Outpatient Prescriptions on File Prior to Visit  Medication Sig Dispense Refill  . allopurinol (ZYLOPRIM) 100 MG tablet Take 2 tablets (200 mg total) by mouth daily.  30 tablet  6  . amLODipine (NORVASC) 10 MG tablet Take 1 tablet (10 mg total) by mouth daily.  90 tablet  3  . aspirin (ASPIRIN EC) 81 MG EC tablet Take 81 mg by mouth daily.        Marland Kitchen atorvastatin (LIPITOR) 10 MG tablet TAKE 1 TABLET BY MOUTH EVERY DAY  90  tablet  3  . clonazePAM (KLONOPIN) 0.5 MG tablet TAKE 1 TABLET BY MOUTH TWICE DAILY AS NEEDED  60 tablet  1  . colchicine 0.6 MG tablet Take 1 tablet (0.6 mg total) by mouth 2 (two) times daily.  10 tablet  0  . DULoxetine (CYMBALTA) 20 MG capsule Take 20 mg by mouth 3 (three) times daily.        Marland Kitchen glucose blood (FREESTYLE TEST STRIPS) test strip Use as instructed  100 each  12  . meloxicam (MOBIC) 15 MG tablet Take 1 tablet (15 mg total) by mouth daily.  30 tablet  0  .  methylphenidate 54 MG PO CR tablet 1 tab by mouth twice per day -  60 tablet  0  . NUCYNTA 100 MG TABS       . pregabalin (LYRICA) 100 MG capsule Take 100 mg by mouth 3 (three) times daily.         No current facility-administered medications on file prior to visit.    Review of Systems Constitutional: Negative for increased diaphoresis, other activity, appetite or other siginficant weight change  HENT: Negative for worsening hearing loss, ear pain, facial swelling, mouth sores and neck stiffness.   Eyes: Negative for other worsening pain, redness or visual disturbance.  Respiratory: Negative for shortness of breath and wheezing.   Cardiovascular: Negative for chest pain and palpitations.  Gastrointestinal: Negative for diarrhea, blood in stool, abdominal distention or other pain Genitourinary: Negative for hematuria, flank pain or change in urine volume.  Musculoskeletal: Negative for myalgias or other joint complaints.  Skin: Negative for color change and wound.  Neurological: Negative for syncope and numbness. other than noted Hematological: Negative for adenopathy. or other swelling Psychiatric/Behavioral: Negative for hallucinations, self-injury, decreased concentration or other worsening agitation.      Objective:   Physical Exam BP 134/82  Pulse 109  Temp(Src) 98.5 F (36.9 C) (Oral)  Wt 200 lb (90.719 kg)  SpO2 98% VS noted,  Constitutional: Pt is oriented to person, place, and time. Appears well-developed and well-nourished.  Head: Normocephalic and atraumatic.  Right Ear: External ear normal.  Left Ear: External ear normal.  Nose: Nose normal.  Mouth/Throat: Oropharynx is clear and moist.  Eyes: Conjunctivae and EOM are normal. Pupils are equal, round, and reactive to light.  Neck: Normal range of motion. Neck supple. No JVD present. No tracheal deviation present.  Cardiovascular: Normal rate, regular rhythm, normal heart sounds and intact distal pulses.     Pulmonary/Chest: Effort normal and breath sounds without rales or wheezing  Abdominal: Soft. Bowel sounds are normal. NT. No HSM  Musculoskeletal: Normal range of motion. Exhibits no edema.  Lymphadenopathy:  Has no cervical adenopathy.  Neurological: Pt is alert and oriented to person, place, and time. Pt has normal reflexes. No cranial nerve deficit. Motor grossly intact Skin: Skin is warm and dry. No rash noted. Has numerous trunk moles Psychiatric:  Has nervous mood and affect. Behavior is normal.     Assessment & Plan:

## 2014-05-19 ENCOUNTER — Ambulatory Visit (INDEPENDENT_AMBULATORY_CARE_PROVIDER_SITE_OTHER): Payer: BC Managed Care – PPO | Admitting: Family Medicine

## 2014-05-19 ENCOUNTER — Other Ambulatory Visit (INDEPENDENT_AMBULATORY_CARE_PROVIDER_SITE_OTHER): Payer: BC Managed Care – PPO

## 2014-05-19 ENCOUNTER — Ambulatory Visit (INDEPENDENT_AMBULATORY_CARE_PROVIDER_SITE_OTHER)
Admission: RE | Admit: 2014-05-19 | Discharge: 2014-05-19 | Disposition: A | Discharge status: Home / Self Care | Payer: BC Managed Care – PPO | Source: Ambulatory Visit | Attending: Family Medicine | Admitting: Family Medicine

## 2014-05-19 ENCOUNTER — Encounter: Payer: Self-pay | Admitting: *Deleted

## 2014-05-19 ENCOUNTER — Encounter: Payer: Self-pay | Admitting: Family Medicine

## 2014-05-19 VITALS — BP 146/86 | HR 109 | Ht 69.0 in | Wt 200.0 lb

## 2014-05-19 DIAGNOSIS — S8261XA Displaced fracture of lateral malleolus of right fibula, initial encounter for closed fracture: Secondary | ICD-10-CM

## 2014-05-19 NOTE — Progress Notes (Signed)
[name] [name], [name] [address] Phone: [phone] Subjective:    CC: Right foot pain.   [id number] Frank [name] is a 58 y.o. male coming in for followup of a lateral malleolus fracture. Patient has been fairly noncompliant this time. Patient stopped the Cam Walker previously a little bit prematurely. Patient is now in a shoe and states that he feels that is doing fairly well. Patient states that he is not in is adamant about this. Patient did not want to hear about anything else but is wondering why he continues to have swelling of his ankle. States that the pain is about the same. Patient does ambulate a better today.   Past medical history, social, surgical and family history all reviewed in electronic medical record.   Review of Systems: No headache, visual changes, nausea, vomiting, diarrhea, constipation, dizziness, abdominal pain, skin rash, fevers, chills, night sweats, weight loss, swollen lymph nodes, body aches, joint swelling, muscle aches, chest pain, shortness of breath, mood changes.   Objective Blood pressure 146/86, pulse 109, height 5\' 9"  (1.753 m), weight 200 lb (90.719 kg), SpO2 97.00%.  General: No apparent distress alert and oriented x3 mood and affect normal, dressed appropriately.  HEENT: Pupils equal, extraocular movements intact  Respiratory: Patient's speak in full sentences and does not appear short of breath  Cardiovascular: No lower extremity edema, non tender, no erythema  Skin: Warm dry intact with no signs of infection or rash on extremities or on axial skeleton.  Abdomen: Soft nontender  Neuro: Cranial nerves II through XII are intact, neurovascularly intact in all extremities with 2+ DTRs and 2+ pulses.  Lymph: No lymphadenopathy of posterior or anterior cervical chain or axillae bilaterally.  Gait normal with good balance and coordination.  MSK:  Non tender with full range of motion and good  stability and symmetric strength and tone of shoulders, elbows, wrist, hip, knees bilaterally. Ankle: Right Continue swelling continued and more than contralateral side touch.continued peripheral neuropathy bilaterally. Patient states that this is his baseline though Range of motion is minimally limited due to the amount of swelling Strength is 4/5 in all directions. Stable lateral and medial ligaments; squeeze test and kleiger test unremarkable; Talar dome nontender; No pain at base of 5th MT; No tenderness over cuboid; No tenderness over N spot or navicular prominence Severely tender over the lateral malleolus No sign of peroneal tendon subluxations or tenderness to palpation Negative tarsal tunnel tinel's Able to walk 4 steps. Contralateral foot unremarkable.  MSK US performed of: right ankle.  This study was ordered, performed, and interpreted by Charlann Boxer D.O.  Foot/Ankle:   All structures visualized.   Talar dome unremarkable  Ankle mortise without effusion. Malleolus seems to have mostly healing. There is a mild cortical defect still left ago but significantly improved. Peroneus longus and brevis tendons unremarkable on long and transverse views without sheath effusions. Posterior tibialis, flexor hallucis longus, and flexor digitorum longus tendons unremarkable on long and transverse views without sheath effusions. Achilles tendon visualized along length of tendon and unremarkable on long and transverse views without sheath effusion. Anterior Talofibular Ligament and Calcaneofibular Ligaments unremarkable and intact. Deltoid Ligament unremarkable and intact. Plantar fascia intact and without effusion, normal thickness. No increased doppler signal, cap sign, or thickening of tibial cortex. Power doppler signal normal.  IMPRESSION:  Continued healing lateral malleolus.     Impression and Recommendations:     This case required medical decision making of  moderate  complexity.

## 2014-05-19 NOTE — Assessment & Plan Note (Signed)
Patient states he has not made any improvement I do see a decrease and some swelling but there is some that is residual. Patient does continue to have pain in does have some tightness on the medial aspect of the ankle. This tightness I think is secondary to him not doing any activity and encouraged him to do the exercises regularly. We once again discussed formal physical therapy which he has declined secondary to financial problems. I discussed with him the thumb holding them out of work any longer we need to advance imaging. X-rays and MRI were ordered today to rule out any intra-articular abnormality that could be contributing to this. Patient does have significant peripheral neuropathy that does complicate healing and does have a history of gouty arthritis. Patient is going to continue to ambulate as much as he can without difficulty. Once we have the MRI patient will come back and we will evaluate it together further. Depending on findings we may need to discuss getting patient back to work or unfortunately referral for surgical intervention. Patient was adamant about once again not having advance imaging but once again when I told him he would need to return to work he decided that this would be in his best interest.  Spent greater than 25 minutes with patient face-to-face and had greater than 50% of counseling including as described above in assessment and plan.

## 2014-05-19 NOTE — Patient Instructions (Signed)
Continue the atrovostatin.  Ice when you need it Xray today MRI ordered Come back 1-2 days after MRI and we will discuss and do injections in shoulder

## 2014-05-31 ENCOUNTER — Ambulatory Visit (INDEPENDENT_AMBULATORY_CARE_PROVIDER_SITE_OTHER): Payer: BC Managed Care – PPO

## 2014-05-31 ENCOUNTER — Other Ambulatory Visit: Payer: BC Managed Care – PPO

## 2014-05-31 DIAGNOSIS — Z23 Encounter for immunization: Secondary | ICD-10-CM

## 2014-05-31 DIAGNOSIS — Z113 Encounter for screening for infections with a predominantly sexual mode of transmission: Secondary | ICD-10-CM

## 2014-06-01 ENCOUNTER — Other Ambulatory Visit: Payer: BC Managed Care – PPO

## 2014-06-01 ENCOUNTER — Encounter: Payer: Self-pay | Admitting: Internal Medicine

## 2014-06-01 LAB — HIV ANTIBODY (ROUTINE TESTING W REFLEX): HIV 1&2 Ab, 4th Generation: NONREACTIVE

## 2014-06-07 ENCOUNTER — Inpatient Hospital Stay: Admission: RE | Admit: 2014-06-07 | Payer: BC Managed Care – PPO | Source: Ambulatory Visit

## 2014-06-09 DIAGNOSIS — Z0279 Encounter for issue of other medical certificate: Secondary | ICD-10-CM

## 2014-06-10 ENCOUNTER — Ambulatory Visit
Admission: RE | Admit: 2014-06-10 | Discharge: 2014-06-10 | Disposition: A | Discharge status: Home / Self Care | Payer: BC Managed Care – PPO | Source: Ambulatory Visit | Attending: Family Medicine | Admitting: Family Medicine

## 2014-06-10 DIAGNOSIS — S8261XA Displaced fracture of lateral malleolus of right fibula, initial encounter for closed fracture: Secondary | ICD-10-CM

## 2014-06-14 ENCOUNTER — Other Ambulatory Visit: Payer: BC Managed Care – PPO

## 2014-06-17 ENCOUNTER — Emergency Department (HOSPITAL_COMMUNITY): Payer: BC Managed Care – PPO

## 2014-06-17 ENCOUNTER — Emergency Department (HOSPITAL_COMMUNITY)
Admission: EM | Admit: 2014-06-17 | Discharge: 2014-06-17 | Disposition: A | Discharge status: Home / Self Care | Payer: BC Managed Care – PPO | Attending: Emergency Medicine | Admitting: Emergency Medicine

## 2014-06-17 ENCOUNTER — Encounter (HOSPITAL_COMMUNITY): Payer: Self-pay | Admitting: *Deleted

## 2014-06-17 DIAGNOSIS — R1012 Left upper quadrant pain: Secondary | ICD-10-CM | POA: Diagnosis not present

## 2014-06-17 DIAGNOSIS — Z791 Long term (current) use of non-steroidal anti-inflammatories (NSAID): Secondary | ICD-10-CM | POA: Insufficient documentation

## 2014-06-17 DIAGNOSIS — R51 Headache: Secondary | ICD-10-CM | POA: Insufficient documentation

## 2014-06-17 DIAGNOSIS — Z87448 Personal history of other diseases of urinary system: Secondary | ICD-10-CM | POA: Diagnosis not present

## 2014-06-17 DIAGNOSIS — H918X9 Other specified hearing loss, unspecified ear: Secondary | ICD-10-CM | POA: Insufficient documentation

## 2014-06-17 DIAGNOSIS — M79604 Pain in right leg: Secondary | ICD-10-CM | POA: Diagnosis not present

## 2014-06-17 DIAGNOSIS — Z79899 Other long term (current) drug therapy: Secondary | ICD-10-CM | POA: Insufficient documentation

## 2014-06-17 DIAGNOSIS — R0602 Shortness of breath: Secondary | ICD-10-CM | POA: Insufficient documentation

## 2014-06-17 DIAGNOSIS — F419 Anxiety disorder, unspecified: Secondary | ICD-10-CM | POA: Insufficient documentation

## 2014-06-17 DIAGNOSIS — Z86010 Personal history of colonic polyps: Secondary | ICD-10-CM | POA: Diagnosis not present

## 2014-06-17 DIAGNOSIS — R519 Headache, unspecified: Secondary | ICD-10-CM

## 2014-06-17 DIAGNOSIS — I10 Essential (primary) hypertension: Secondary | ICD-10-CM | POA: Insufficient documentation

## 2014-06-17 DIAGNOSIS — E785 Hyperlipidemia, unspecified: Secondary | ICD-10-CM | POA: Diagnosis not present

## 2014-06-17 DIAGNOSIS — M79605 Pain in left leg: Secondary | ICD-10-CM | POA: Diagnosis not present

## 2014-06-17 DIAGNOSIS — R1013 Epigastric pain: Secondary | ICD-10-CM | POA: Insufficient documentation

## 2014-06-17 DIAGNOSIS — G609 Hereditary and idiopathic neuropathy, unspecified: Secondary | ICD-10-CM | POA: Insufficient documentation

## 2014-06-17 DIAGNOSIS — R1011 Right upper quadrant pain: Secondary | ICD-10-CM | POA: Diagnosis not present

## 2014-06-17 DIAGNOSIS — Z7982 Long term (current) use of aspirin: Secondary | ICD-10-CM | POA: Insufficient documentation

## 2014-06-17 DIAGNOSIS — E119 Type 2 diabetes mellitus without complications: Secondary | ICD-10-CM | POA: Insufficient documentation

## 2014-06-17 DIAGNOSIS — F909 Attention-deficit hyperactivity disorder, unspecified type: Secondary | ICD-10-CM | POA: Insufficient documentation

## 2014-06-17 DIAGNOSIS — F329 Major depressive disorder, single episode, unspecified: Secondary | ICD-10-CM | POA: Insufficient documentation

## 2014-06-17 DIAGNOSIS — R200 Anesthesia of skin: Secondary | ICD-10-CM | POA: Diagnosis not present

## 2014-06-17 DIAGNOSIS — Z8719 Personal history of other diseases of the digestive system: Secondary | ICD-10-CM | POA: Diagnosis not present

## 2014-06-17 DIAGNOSIS — M545 Low back pain: Secondary | ICD-10-CM | POA: Insufficient documentation

## 2014-06-17 DIAGNOSIS — M549 Dorsalgia, unspecified: Secondary | ICD-10-CM | POA: Diagnosis present

## 2014-06-17 LAB — COMPREHENSIVE METABOLIC PANEL
ALK PHOS: 74 U/L (ref 39–117)
ALT: 19 U/L (ref 0–53)
ANION GAP: 15 (ref 5–15)
AST: 17 U/L (ref 0–37)
Albumin: 4.3 g/dL (ref 3.5–5.2)
BILIRUBIN TOTAL: 0.8 mg/dL (ref 0.3–1.2)
BUN: 17 mg/dL (ref 6–23)
CHLORIDE: 102 meq/L (ref 96–112)
CO2: 25 meq/L (ref 19–32)
Calcium: 10.7 mg/dL — ABNORMAL HIGH (ref 8.4–10.5)
Creatinine, Ser: 1.02 mg/dL (ref 0.50–1.35)
GFR calc Af Amer: >90 mL/min (ref >90)
GFR calc non Af Amer: 78 mL/min — ABNORMAL LOW (ref >90)
Glucose, Bld: 128 mg/dL — ABNORMAL HIGH (ref 70–99)
POTASSIUM: 4.4 meq/L (ref 3.7–5.3)
Sodium: 142 mEq/L (ref 137–147)
Total Protein: 8 g/dL (ref 6.0–8.3)

## 2014-06-17 LAB — CBC WITH DIFFERENTIAL/PLATELET
BASOS ABS: 0 10*3/uL (ref 0.0–0.1)
Basophils Relative: 1 % (ref 0–1)
Eosinophils Absolute: 0 10*3/uL (ref 0.0–0.7)
Eosinophils Relative: 1 % (ref 0–5)
HEMATOCRIT: 44 % (ref 39.0–52.0)
Hemoglobin: 15.9 g/dL (ref 13.0–17.0)
LYMPHS PCT: 30 % (ref 12–46)
Lymphs Abs: 2.6 10*3/uL (ref 0.7–4.0)
MCH: 31.5 pg (ref 26.0–34.0)
MCHC: 36.1 g/dL — ABNORMAL HIGH (ref 30.0–36.0)
MCV: 87.3 fL (ref 78.0–100.0)
MONO ABS: 0.7 10*3/uL (ref 0.1–1.0)
Monocytes Relative: 8 % (ref 3–12)
NEUTROS ABS: 5.4 10*3/uL (ref 1.7–7.7)
NEUTROS PCT: 60 % (ref 43–77)
Platelets: 238 10*3/uL (ref 150–400)
RBC: 5.04 MIL/uL (ref 4.22–5.81)
RDW: 13 % (ref 11.5–15.5)
WBC: 8.8 10*3/uL (ref 4.0–10.5)

## 2014-06-17 LAB — I-STAT TROPONIN, ED: TROPONIN I, POC: 0.01 ng/mL (ref 0.00–0.08)

## 2014-06-17 LAB — URINALYSIS, ROUTINE W REFLEX MICROSCOPIC
Bilirubin Urine: NEGATIVE
Glucose, UA: NEGATIVE mg/dL
Hgb urine dipstick: NEGATIVE
Ketones, ur: NEGATIVE mg/dL
LEUKOCYTES UA: NEGATIVE
NITRITE: NEGATIVE
Protein, ur: NEGATIVE mg/dL
SPECIFIC GRAVITY, URINE: 1.017 (ref 1.005–1.030)
UROBILINOGEN UA: 0.2 mg/dL (ref 0.0–1.0)
pH: 7 (ref 5.0–8.0)

## 2014-06-17 LAB — LIPASE, BLOOD: Lipase: 17 U/L (ref 11–59)

## 2014-06-17 MED ORDER — SODIUM CHLORIDE 0.9 % IV SOLN
1000.0000 mL | Freq: Once | INTRAVENOUS | Status: AC
Start: 2014-06-17 — End: 2014-06-17
  Administered 2014-06-17: 1000 mL via INTRAVENOUS

## 2014-06-17 MED ORDER — ONDANSETRON HCL 4 MG/2ML IJ SOLN
4.0000 mg | Freq: Once | INTRAMUSCULAR | Status: AC
  Administered 2014-06-17: 4 mg via INTRAVENOUS
  Filled 2014-06-17: qty 2

## 2014-06-17 MED ORDER — SODIUM CHLORIDE 0.9 % IV SOLN
1000.0000 mL | INTRAVENOUS | Status: DC
  Administered 2014-06-17: 1000 mL via INTRAVENOUS

## 2014-06-17 MED ORDER — HYDROMORPHONE HCL 1 MG/ML IJ SOLN
1.0000 mg | INTRAMUSCULAR | Status: DC | PRN
  Administered 2014-06-17 (×2): 1 mg via INTRAVENOUS
  Filled 2014-06-17 (×2): qty 1

## 2014-06-17 MED ORDER — IOHEXOL 350 MG/ML SOLN
100.0000 mL | Freq: Once | INTRAVENOUS | Status: AC | PRN
  Administered 2014-06-17: 100 mL via INTRAVENOUS

## 2014-06-17 NOTE — ED Provider Notes (Signed)
CSN: [id number]     Arrival date & time 06/17/14  1828 History   First MD Initiated Contact with Patient 06/17/14 1834     Chief Complaint  Patient presents with  . multiple complaints   . Back Pain  . leg numbness   . Shortness of Breath   HPI Pt started having pain in his back on Thursday.  The pain is in the midback moving down to his lower back.  The pain is on both sides.  It is sharp and achy.  He cannot find a comfortable position.   It has been increasing in severity each day.  He cant find a comfortable position.  No trouble with nausea or vomiting.  He has not wanted to eat much but that is because his food tastes funny.   No abdominal pain.  His legs feel numb associated with the pain.  That goes from his feet all the way up.  He also feels like both arms are numb.  No recent travel.   Past Medical History  Diagnosis Date  . COLONIC POLYPS 02/01/2008  . DIABETES MELLITUS, TYPE II 05/20/2010  . HYPERLIPIDEMIA 10/09/2008  . ANXIETY DEPRESSION 02/01/2008  . ERECTILE DYSFUNCTION 10/09/2008  . ADD 10/09/2008  . SLEEP APNEA, OBSTRUCTIVE 02/01/2008  . MORTON'S NEUROMA 05/20/2010  . PERIPHERAL NEUROPATHY 05/20/2010  . Other specified forms of hearing loss 06/27/2009  . HYPERTENSION 10/09/2008  . HEMORRHOIDS 02/01/2008  . ALLERGIC RHINITIS 10/09/2008  . Stricture and stenosis of esophagus 02/02/2008  . GERD 02/01/2008  . HIATAL HERNIA 02/01/2008  . ERECTILE DYSFUNCTION, ORGANIC 05/20/2010  . WRIST PAIN, LEFT 12/05/2009  . FOOT PAIN, LEFT 05/20/2010  . PERIPHERAL EDEMA 05/20/2010  . DYSPNEA 03/12/2010  . Abdominal pain, unspecified site 01/19/2009  . Abdominal pain, left lower quadrant 06/06/2010  . Type II or unspecified type diabetes mellitus without mention of complication, uncontrolled 11/14/2010   Past Surgical History  Procedure Laterality Date  . Carpal tunnel release    . Rotator cuff repair    . Esophageal dilation  july 2009  . Eye surgery     Family History  Problem Relation Age of  Onset  . Diabetes Mother   . Heart disease Mother   . Hyperlipidemia Mother   . Depression Mother   . Diabetes Brother   . Colon cancer Neg Hx    History  Substance Use Topics  . Smoking status: Never Smoker   . Smokeless tobacco: Never Used  . Alcohol Use: Yes     Comment: moderately    Review of Systems  Constitutional: Negative for fever.  Respiratory: Positive for shortness of breath. Negative for cough.   Gastrointestinal: Negative for abdominal pain.  Genitourinary: Negative for dysuria.  All other systems reviewed and are negative.     Allergies  Codeine and Metformin and related  Home Medications   Prior to Admission medications   Medication Sig Start Date End Date Taking? Authorizing Provider  allopurinol (ZYLOPRIM) 100 MG tablet Take 2 tablets (200 mg total) by mouth daily. 03/27/14   Lyndal Pulley, DO  amLODipine (NORVASC) 10 MG tablet Take 1 tablet (10 mg total) by mouth daily. 03/27/14   Lyndal Pulley, DO  aspirin (ASPIRIN EC) 81 MG EC tablet Take 81 mg by mouth daily.      Historical Provider, MD  atorvastatin (LIPITOR) 10 MG tablet TAKE 1 TABLET BY MOUTH EVERY DAY 07/26/13   Biagio Borg, MD  clonazePAM (KLONOPIN) 0.5 MG tablet  TAKE 1 TABLET BY MOUTH TWICE DAILY AS NEEDED 04/05/14   Biagio Borg, MD  colchicine 0.6 MG tablet Take 1 tablet (0.6 mg total) by mouth 2 (two) times daily. 03/27/14   Lyndal Pulley, DO  DULoxetine (CYMBALTA) 20 MG capsule Take 20 mg by mouth 3 (three) times daily.      Historical Provider, MD  glipiZIDE (GLUCOTROL XL) 2.5 MG 24 hr tablet Take 1 tablet (2.5 mg total) by mouth daily with breakfast. 05/17/14   Biagio Borg, MD  glucose blood (FREESTYLE TEST STRIPS) test strip Use as instructed 06/04/12   Biagio Borg, MD  meloxicam (MOBIC) 15 MG tablet Take 1 tablet (15 mg total) by mouth daily. 03/06/14   Zenaida Niece, PA-C  methylphenidate 54 MG PO CR tablet 1 tab by mouth twice per day - 04/05/14   Biagio Borg, MD  NUCYNTA 100  MG TABS  05/12/13   Historical Provider, MD  pregabalin (LYRICA) 100 MG capsule Take 100 mg by mouth 3 (three) times daily.      Historical Provider, MD   BP 165/91 mmHg  Pulse 112  Temp(Src) 98.5 F (36.9 C) (Oral)  Resp 20  SpO2 96% Physical Exam  Constitutional: He appears distressed.  ill appearing  HENT:  Head: Normocephalic and atraumatic.  Right Ear: External ear normal.  Left Ear: External ear normal.  Eyes: Conjunctivae are normal. Right eye exhibits no discharge. Left eye exhibits no discharge. No scleral icterus.  Neck: Neck supple. No tracheal deviation present.  Cardiovascular: Normal rate, regular rhythm and intact distal pulses.   Pulmonary/Chest: Effort normal and breath sounds normal. No stridor. No respiratory distress. He has no wheezes. He has no rales.  Abdominal: Soft. Bowel sounds are normal. He exhibits no distension, no pulsatile midline mass and no mass. There is tenderness in the right upper quadrant, epigastric area and left upper quadrant. There is no rigidity, no rebound and no guarding. No hernia.  Genitourinary:  Nl perianal sensation, no decreased tone  Musculoskeletal: He exhibits no edema or tenderness.  Neurological: He is alert. No cranial nerve deficit (no facial droop, extraocular movements intact, no slurred speech). He exhibits normal muscle tone. He displays no seizure activity. Coordination normal.  Reflex Scores:      Patellar reflexes are 2+ on the right side and 2+ on the left side.      Achilles reflexes are 2+ on the right side and 2+ on the left side. 5/5 plantar and dorsiflexion bilaterally, subjective decrease sensation bilaterally below knees, to light touch  Skin: Skin is warm and dry. No rash noted.  Psychiatric: He has a normal mood and affect.  Nursing note and vitals reviewed.   ED Course  Procedures (including critical care time) Labs Review Labs Reviewed  CBC WITH DIFFERENTIAL - Abnormal; Notable for the following:     MCHC 36.1 (*)    All other components within normal limits  COMPREHENSIVE METABOLIC PANEL - Abnormal; Notable for the following:    Glucose, Bld 128 (*)    Calcium 10.7 (*)    GFR calc non Af Amer 78 (*)    All other components within normal limits  LIPASE, BLOOD  URINALYSIS, ROUTINE W REFLEX MICROSCOPIC  I-STAT TROPOININ, ED    Imaging Review Dg Chest 2 View  06/17/2014   CLINICAL DATA:  Chest pain radiating through to the back for 3 days. History of hypertension. Diabetic.  EXAM: CHEST  2 VIEW  COMPARISON:  03/12/2010  FINDINGS: Normal heart, mediastinum hila.  Lungs are clear.  No pleural effusion or pneumothorax.  Bony thorax is demineralized. Mild degenerative changes noted along the mid thoracic spine.  IMPRESSION: No active cardiopulmonary disease.   Electronically Signed   By: Lajean Manes M.D.   On: 06/17/2014 19:17   Ct Head Wo Contrast  06/17/2014   CLINICAL DATA:  Pt c/o headache since Thursday, no injury, blurred vision, pt poor historian, seems confused, unable to provide detailed hx  EXAM: CT HEAD WITHOUT CONTRAST  TECHNIQUE: Contiguous axial images were obtained from the base of the skull through the vertex without intravenous contrast.  COMPARISON:  None.  FINDINGS: Ventricles are normal in configuration. There is ventricular and sulcal enlargement reflecting mild age related volume loss. No hydrocephalus. No parenchymal masses or mass effect. There is no evidence of an infarct. Minor periventricular white matter hypoattenuation is noted consistent with chronic microvascular ischemic change.  There are no extra-axial masses or abnormal fluid collections.  No intracranial hemorrhage.  Visualized sinuses and mastoid air cells are clear.  IMPRESSION: 1. No acute intracranial abnormalities. 2. Age related volume loss and minor chronic microvascular ischemic change.   Electronically Signed   By: Lajean Manes M.D.   On: 06/17/2014 20:35   Ct Angio Abdomen W/cm &/or Wo  Contrast  06/17/2014   CLINICAL DATA:  Mid thoracic back pain.  EXAM: CT ANGIOGRAPHY CHEST, ABDOMEN AND PELVIS  TECHNIQUE: Multidetector CT imaging through the chest, abdomen and pelvis was performed using the standard protocol during bolus administration of intravenous contrast. Multiplanar reconstructed images and MIPs were obtained and reviewed to evaluate the vascular anatomy.  COMPARISON:  Radiograph 06/17/2004  FINDINGS: CTA CHEST FINDINGS  Non IV contrast images demonstrate no intramural hematoma within the thoracic aorta.  Contrast images demonstrate no contour abnormality of the aorta to suggest dissection or aneurysm. The great vessels are normal. No central pulmonary embolism is present. No pericardial fluid.  Review of the lung parenchyma demonstrates no pneumothorax. No pleural fluid or infiltrate. No axillary or supraclavicular adenopathy. No mediastinal adenopathy. Esophagus is normal. No mediastinal gas.  Review of the MIP images confirms the above findings.  CTA ABDOMEN AND PELVIS FINDINGS  There is no aneurysm or dissection of the abdominal aorta or iliac arteries. Mild intimal calcification of the abdominal aorta. The superior mesenteric artery is narrowed at its origin. The SMA is widely patent. There are 2 renal arteries on the left and 2 renal arteries on the right. IMA is patent.  No focal hepatic lesion. Gallbladder, pancreas, spleen, adrenal glands normal. Kidneys are normal with no obstruction of the ureters. The bladder is normal.  Stomach, small bowel, appendix, cecum normal. The colon and rectosigmoid colon are normal.  No free fluid the pelvis. The prostate gland and bladder normal. No aggressive osseous lesion or acute lesion in the skeleton.  Review of the MIP images confirms the above findings.  IMPRESSION: Chest Impression:  1. No thoracic aortic aneurysm or dissection. 2. No pulmonary embolism. 3. No pneumothorax. 4. No mediastinal gas.  Abdomen / Pelvis Impression:  1. No  evidence of abdominal aortic aneurysm or dissection. 2. Mild atherosclerotic calcification aorta. 3. No acute findings in the abdomen or pelvis. 4. No evidence of fracture.   Electronically Signed   By: Suzy Bouchard M.D.   On: 06/17/2014 21:03   Ct Angio Chest Aorta W/cm &/or Wo/cm  06/17/2014   CLINICAL DATA:  Mid thoracic back pain.  EXAM: CT  ANGIOGRAPHY CHEST, ABDOMEN AND PELVIS  TECHNIQUE: Multidetector CT imaging through the chest, abdomen and pelvis was performed using the standard protocol during bolus administration of intravenous contrast. Multiplanar reconstructed images and MIPs were obtained and reviewed to evaluate the vascular anatomy.  COMPARISON:  Radiograph 06/17/2004  FINDINGS: CTA CHEST FINDINGS  Non IV contrast images demonstrate no intramural hematoma within the thoracic aorta.  Contrast images demonstrate no contour abnormality of the aorta to suggest dissection or aneurysm. The great vessels are normal. No central pulmonary embolism is present. No pericardial fluid.  Review of the lung parenchyma demonstrates no pneumothorax. No pleural fluid or infiltrate. No axillary or supraclavicular adenopathy. No mediastinal adenopathy. Esophagus is normal. No mediastinal gas.  Review of the MIP images confirms the above findings.  CTA ABDOMEN AND PELVIS FINDINGS  There is no aneurysm or dissection of the abdominal aorta or iliac arteries. Mild intimal calcification of the abdominal aorta. The superior mesenteric artery is narrowed at its origin. The SMA is widely patent. There are 2 renal arteries on the left and 2 renal arteries on the right. IMA is patent.  No focal hepatic lesion. Gallbladder, pancreas, spleen, adrenal glands normal. Kidneys are normal with no obstruction of the ureters. The bladder is normal.  Stomach, small bowel, appendix, cecum normal. The colon and rectosigmoid colon are normal.  No free fluid the pelvis. The prostate gland and bladder normal. No aggressive osseous lesion  or acute lesion in the skeleton.  Review of the MIP images confirms the above findings.  IMPRESSION: Chest Impression:  1. No thoracic aortic aneurysm or dissection. 2. No pulmonary embolism. 3. No pneumothorax. 4. No mediastinal gas.  Abdomen / Pelvis Impression:  1. No evidence of abdominal aortic aneurysm or dissection. 2. Mild atherosclerotic calcification aorta. 3. No acute findings in the abdomen or pelvis. 4. No evidence of fracture.   Electronically Signed   By: Suzy Bouchard M.D.   On: 06/17/2014 21:03     EKG Interpretation   Date/Time:  Saturday June 17 2014 19:35:12 EST Ventricular Rate:  86 PR Interval:  161 QRS Duration: 116 QT Interval:  413 QTC Calculation: 494 R Axis:   0 Text Interpretation:  Sinus rhythm Nonspecific intraventricular conduction  delay Borderline T abnormalities, lateral leads No significant change  since last tracing Confirmed by Eldean Nanna  MD-J, Hazen Brumett ([id number]) on 06/17/2014  10:34:43 PM      MDM   Final diagnoses:  Headache  Low back pain without sciatica, unspecified back pain laterality    Patient had an extensive workup in the emergency department. Was concerned about the possibility of thoracic aortic dissection or possible abdominal aortic aneurysm concerning his complaints and his degree of discomfort initially. Fortunately this workup was all negative.  After his evaluation the patient did admit that he been lifting a lot of boxes recently. His pain is musculoskeletal in nature. Portion of it may be related to radiculopathy.  The patient is more comfortable now after treatment. Discussed the findings and recommended close outpatient follow-up. Patient is comfortable with this plan.    Dorie Rank, MD 06/17/14 2236

## 2014-06-17 NOTE — Discharge Instructions (Signed)
Back Pain, Adult Low back pain is very common. About 1 in 5 people have back pain.The cause of low back pain is rarely dangerous. The pain often gets better over time.About half of people with a sudden onset of back pain feel better in just 2 weeks. About 8 in 10 people feel better by 6 weeks.  CAUSES Some common causes of back pain include:  Strain of the muscles or ligaments supporting the spine.  Wear and tear (degeneration) of the spinal discs.  Arthritis.  Direct injury to the back. DIAGNOSIS Most of the time, the direct cause of low back pain is not known.However, back pain can be treated effectively even when the exact cause of the pain is unknown.Answering your caregiver's questions about your overall health and symptoms is one of the most accurate ways to make sure the cause of your pain is not dangerous. If your caregiver needs more information, he or she may order lab work or imaging tests (X-rays or MRIs).However, even if imaging tests show changes in your back, this usually does not require surgery. HOME CARE INSTRUCTIONS For many people, back pain returns.Since low back pain is rarely dangerous, it is often a condition that people can learn to manageon their own.   Remain active. It is stressful on the back to sit or stand in one place. Do not sit, drive, or stand in one place for more than 30 minutes at a time. Take short walks on level surfaces as soon as pain allows.Try to increase the length of time you walk each day.  Do not stay in bed.Resting more than 1 or 2 days can delay your recovery.  Do not avoid exercise or work.Your body is made to move.It is not dangerous to be active, even though your back may hurt.Your back will likely heal faster if you return to being active before your pain is gone.  Pay attention to your body when you bend and lift. Many people have less discomfortwhen lifting if they bend their knees, keep the load close to their bodies,and  avoid twisting. Often, the most comfortable positions are those that put less stress on your recovering back.  Find a comfortable position to sleep. Use a firm mattress and lie on your side with your knees slightly bent. If you lie on your back, put a pillow under your knees.  Only take over-the-counter or prescription medicines as directed by your caregiver. Over-the-counter medicines to reduce pain and inflammation are often the most helpful.Your caregiver may prescribe muscle relaxant drugs.These medicines help dull your pain so you can more quickly return to your normal activities and healthy exercise.  Put ice on the injured area.  Put ice in a plastic bag.  Place a towel between your skin and the bag.  Leave the ice on for 15-20 minutes, 03-04 times a day for the first 2 to 3 days. After that, ice and heat may be alternated to reduce pain and spasms.  Ask your caregiver about trying back exercises and gentle massage. This may be of some benefit.  Avoid feeling anxious or stressed.Stress increases muscle tension and can worsen back pain.It is important to recognize when you are anxious or stressed and learn ways to manage it.Exercise is a great option. SEEK MEDICAL CARE IF:  You have pain that is not relieved with rest or medicine.  You have pain that does not improve in 1 week.  You have new symptoms.  You are generally not feeling well. SEEK   IMMEDIATE MEDICAL CARE IF:   You have pain that radiates from your back into your legs.  You develop new bowel or bladder control problems.  You have unusual weakness or numbness in your arms or legs.  You develop nausea or vomiting.  You develop abdominal pain.  You feel faint. Document Released: 07/28/2005 Document Revised: 01/27/2012 Document Reviewed: 11/29/2013 [employer] Patient Information 2015 [employer]. This information is not intended to replace advice given to you by your health care provider. Make sure you  discuss any questions you have with your health care provider.  

## 2014-06-17 NOTE — ED Notes (Signed)
Patient transported to CT 

## 2014-06-17 NOTE — ED Notes (Signed)
Pt reports he was lifting furniture on Thursday, since then pt has been having back pain, leg numbness, and SOB. Able to speak in full sentences.

## 2014-06-20 ENCOUNTER — Telehealth: Payer: Self-pay | Admitting: Internal Medicine

## 2014-06-20 ENCOUNTER — Emergency Department (HOSPITAL_COMMUNITY)
Admission: EM | Admit: 2014-06-20 | Discharge: 2014-06-20 | Disposition: A | Discharge status: Home / Self Care | Payer: BC Managed Care – PPO | Attending: Emergency Medicine | Admitting: Emergency Medicine

## 2014-06-20 ENCOUNTER — Encounter (HOSPITAL_COMMUNITY): Payer: Self-pay | Admitting: Emergency Medicine

## 2014-06-20 ENCOUNTER — Emergency Department (HOSPITAL_COMMUNITY): Payer: BC Managed Care – PPO

## 2014-06-20 DIAGNOSIS — F419 Anxiety disorder, unspecified: Secondary | ICD-10-CM | POA: Diagnosis not present

## 2014-06-20 DIAGNOSIS — Z79899 Other long term (current) drug therapy: Secondary | ICD-10-CM | POA: Insufficient documentation

## 2014-06-20 DIAGNOSIS — Z87448 Personal history of other diseases of urinary system: Secondary | ICD-10-CM | POA: Insufficient documentation

## 2014-06-20 DIAGNOSIS — H918X9 Other specified hearing loss, unspecified ear: Secondary | ICD-10-CM | POA: Diagnosis not present

## 2014-06-20 DIAGNOSIS — Z86010 Personal history of colonic polyps: Secondary | ICD-10-CM | POA: Diagnosis not present

## 2014-06-20 DIAGNOSIS — I10 Essential (primary) hypertension: Secondary | ICD-10-CM | POA: Insufficient documentation

## 2014-06-20 DIAGNOSIS — M545 Low back pain, unspecified: Secondary | ICD-10-CM

## 2014-06-20 DIAGNOSIS — G629 Polyneuropathy, unspecified: Secondary | ICD-10-CM | POA: Insufficient documentation

## 2014-06-20 DIAGNOSIS — E119 Type 2 diabetes mellitus without complications: Secondary | ICD-10-CM | POA: Insufficient documentation

## 2014-06-20 DIAGNOSIS — K219 Gastro-esophageal reflux disease without esophagitis: Secondary | ICD-10-CM | POA: Diagnosis not present

## 2014-06-20 DIAGNOSIS — Z791 Long term (current) use of non-steroidal anti-inflammatories (NSAID): Secondary | ICD-10-CM | POA: Insufficient documentation

## 2014-06-20 DIAGNOSIS — G8929 Other chronic pain: Secondary | ICD-10-CM | POA: Diagnosis not present

## 2014-06-20 DIAGNOSIS — E785 Hyperlipidemia, unspecified: Secondary | ICD-10-CM | POA: Diagnosis not present

## 2014-06-20 DIAGNOSIS — F329 Major depressive disorder, single episode, unspecified: Secondary | ICD-10-CM | POA: Insufficient documentation

## 2014-06-20 DIAGNOSIS — Z792 Long term (current) use of antibiotics: Secondary | ICD-10-CM | POA: Insufficient documentation

## 2014-06-20 LAB — CBC WITH DIFFERENTIAL/PLATELET
Basophils Absolute: 0 10*3/uL (ref 0.0–0.1)
Basophils Relative: 0 % (ref 0–1)
Eosinophils Absolute: 0 10*3/uL (ref 0.0–0.7)
Eosinophils Relative: 1 % (ref 0–5)
HCT: 41.9 % (ref 39.0–52.0)
Hemoglobin: 15.1 g/dL (ref 13.0–17.0)
Lymphocytes Relative: 23 % (ref 12–46)
Lymphs Abs: 1.9 10*3/uL (ref 0.7–4.0)
MCH: 31.1 pg (ref 26.0–34.0)
MCHC: 36 g/dL (ref 30.0–36.0)
MCV: 86.2 fL (ref 78.0–100.0)
Monocytes Absolute: 0.8 10*3/uL (ref 0.1–1.0)
Monocytes Relative: 9 % (ref 3–12)
Neutro Abs: 5.6 10*3/uL (ref 1.7–7.7)
Neutrophils Relative %: 67 % (ref 43–77)
Platelets: 236 10*3/uL (ref 150–400)
RBC: 4.86 MIL/uL (ref 4.22–5.81)
RDW: 12.6 % (ref 11.5–15.5)
WBC: 8.3 10*3/uL (ref 4.0–10.5)

## 2014-06-20 LAB — BASIC METABOLIC PANEL
Anion gap: 15 (ref 5–15)
BUN: 18 mg/dL (ref 6–23)
CO2: 24 mEq/L (ref 19–32)
Calcium: 9.6 mg/dL (ref 8.4–10.5)
Chloride: 100 mEq/L (ref 96–112)
Creatinine, Ser: 0.86 mg/dL (ref 0.50–1.35)
GFR calc Af Amer: >90 mL/min (ref >90)
GFR calc non Af Amer: >90 mL/min (ref >90)
Glucose, Bld: 131 mg/dL — ABNORMAL HIGH (ref 70–99)
Potassium: 3.9 mEq/L (ref 3.7–5.3)
Sodium: 139 mEq/L (ref 137–147)

## 2014-06-20 MED ORDER — ONDANSETRON HCL 4 MG/2ML IJ SOLN
4.0000 mg | Freq: Once | INTRAMUSCULAR | Status: AC
  Administered 2014-06-20: 4 mg via INTRAVENOUS
  Filled 2014-06-20: qty 2

## 2014-06-20 MED ORDER — HYDROMORPHONE HCL 1 MG/ML IJ SOLN
1.0000 mg | Freq: Once | INTRAMUSCULAR | Status: AC
  Administered 2014-06-20: 1 mg via INTRAVENOUS
  Filled 2014-06-20: qty 1

## 2014-06-20 MED ORDER — LORAZEPAM 2 MG/ML IJ SOLN
1.0000 mg | Freq: Once | INTRAMUSCULAR | Status: AC
  Administered 2014-06-20: 1 mg via INTRAVENOUS
  Filled 2014-06-20: qty 1

## 2014-06-20 MED ORDER — OXYCODONE-ACETAMINOPHEN 7.5-325 MG PO TABS: 1.0000 | ORAL_TABLET | Freq: Four times a day (QID) | ORAL | Status: DC | PRN

## 2014-06-20 NOTE — Discharge Instructions (Signed)
Back Pain, Adult Low back pain is very common. About 1 in 5 people have back pain.The cause of low back pain is rarely dangerous. The pain often gets better over time.About half of people with a sudden onset of back pain feel better in just 2 weeks. About 8 in 10 people feel better by 6 weeks.  CAUSES Some common causes of back pain include:  Strain of the muscles or ligaments supporting the spine.  Wear and tear (degeneration) of the spinal discs.  Arthritis.  Direct injury to the back. DIAGNOSIS Most of the time, the direct cause of low back pain is not known.However, back pain can be treated effectively even when the exact cause of the pain is unknown.Answering your caregiver's questions about your overall health and symptoms is one of the most accurate ways to make sure the cause of your pain is not dangerous. If your caregiver needs more information, he or she may order lab work or imaging tests (X-rays or MRIs).However, even if imaging tests show changes in your back, this usually does not require surgery. HOME CARE INSTRUCTIONS For many people, back pain returns.Since low back pain is rarely dangerous, it is often a condition that people can learn to manageon their own.   Remain active. It is stressful on the back to sit or stand in one place. Do not sit, drive, or stand in one place for more than 30 minutes at a time. Take short walks on level surfaces as soon as pain allows.Try to increase the length of time you walk each day.  Do not stay in bed.Resting more than 1 or 2 days can delay your recovery.  Do not avoid exercise or work.Your body is made to move.It is not dangerous to be active, even though your back may hurt.Your back will likely heal faster if you return to being active before your pain is gone.  Pay attention to your body when you bend and lift. Many people have less discomfortwhen lifting if they bend their knees, keep the load close to their bodies,and  avoid twisting. Often, the most comfortable positions are those that put less stress on your recovering back.  Find a comfortable position to sleep. Use a firm mattress and lie on your side with your knees slightly bent. If you lie on your back, put a pillow under your knees.  Only take over-the-counter or prescription medicines as directed by your caregiver. Over-the-counter medicines to reduce pain and inflammation are often the most helpful.Your caregiver may prescribe muscle relaxant drugs.These medicines help dull your pain so you can more quickly return to your normal activities and healthy exercise.  Put ice on the injured area.  Put ice in a plastic bag.  Place a towel between your skin and the bag.  Leave the ice on for 15-20 minutes, 03-04 times a day for the first 2 to 3 days. After that, ice and heat may be alternated to reduce pain and spasms.  Ask your caregiver about trying back exercises and gentle massage. This may be of some benefit.  Avoid feeling anxious or stressed.Stress increases muscle tension and can worsen back pain.It is important to recognize when you are anxious or stressed and learn ways to manage it.Exercise is a great option. SEEK MEDICAL CARE IF:  You have pain that is not relieved with rest or medicine.  You have pain that does not improve in 1 week.  You have new symptoms.  You are generally not feeling well. SEEK   IMMEDIATE MEDICAL CARE IF:   You have pain that radiates from your back into your legs.  You develop new bowel or bladder control problems.  You have unusual weakness or numbness in your arms or legs.  You develop nausea or vomiting.  You develop abdominal pain.  You feel faint. Document Released: 07/28/2005 Document Revised: 01/27/2012 Document Reviewed: 11/29/2013 [employer] Patient Information 2015 [employer]. This information is not intended to replace advice given to you by your health care provider. Make sure you  discuss any questions you have with your health care provider.  

## 2014-06-20 NOTE — Telephone Encounter (Signed)
If he means for his left back pain with sciatica, neurology is not likely to help  Would he be ok with a referall to neurosurgury or orthopedics

## 2014-06-20 NOTE — ED Notes (Signed)
Patient attempted to urinate and was unsuccessful. Notified RN and MD

## 2014-06-20 NOTE — Telephone Encounter (Signed)
Patient would like to be referred too neurology, Please advise

## 2014-06-20 NOTE — ED Notes (Signed)
Per pt, states lower back pain since last Sat-was worked up here-states symptoms worsened yesterday

## 2014-06-20 NOTE — ED Notes (Signed)
Patient transported to MRI 

## 2014-06-20 NOTE — ED Notes (Signed)
Patient has been notified 2 times now that we still need a urine sample. RN aware. Will try again later.

## 2014-06-21 ENCOUNTER — Encounter (HOSPITAL_COMMUNITY): Payer: Self-pay | Admitting: Emergency Medicine

## 2014-06-21 ENCOUNTER — Ambulatory Visit (INDEPENDENT_AMBULATORY_CARE_PROVIDER_SITE_OTHER): Payer: BC Managed Care – PPO | Admitting: Internal Medicine

## 2014-06-21 ENCOUNTER — Inpatient Hospital Stay (HOSPITAL_COMMUNITY)
Admission: EM | Admit: 2014-06-21 | Discharge: 2014-06-28 | DRG: 391 | Disposition: A | Discharge status: Skilled Nursing Facility | Payer: BC Managed Care – PPO | Attending: Internal Medicine | Admitting: Internal Medicine

## 2014-06-21 ENCOUNTER — Inpatient Hospital Stay (HOSPITAL_COMMUNITY): Payer: BC Managed Care – PPO

## 2014-06-21 ENCOUNTER — Encounter: Payer: Self-pay | Admitting: Internal Medicine

## 2014-06-21 ENCOUNTER — Telehealth: Payer: Self-pay | Admitting: Internal Medicine

## 2014-06-21 VITALS — BP 100/80 | HR 117 | Temp 98.6°F

## 2014-06-21 DIAGNOSIS — R269 Unspecified abnormalities of gait and mobility: Secondary | ICD-10-CM

## 2014-06-21 DIAGNOSIS — Y95 Nosocomial condition: Secondary | ICD-10-CM | POA: Diagnosis present

## 2014-06-21 DIAGNOSIS — Z8249 Family history of ischemic heart disease and other diseases of the circulatory system: Secondary | ICD-10-CM

## 2014-06-21 DIAGNOSIS — R112 Nausea with vomiting, unspecified: Secondary | ICD-10-CM | POA: Diagnosis present

## 2014-06-21 DIAGNOSIS — Z86010 Personal history of colonic polyps: Secondary | ICD-10-CM

## 2014-06-21 DIAGNOSIS — G43A1 Cyclical vomiting, intractable: Secondary | ICD-10-CM | POA: Diagnosis not present

## 2014-06-21 DIAGNOSIS — K224 Dyskinesia of esophagus: Secondary | ICD-10-CM | POA: Diagnosis present

## 2014-06-21 DIAGNOSIS — E0840 Diabetes mellitus due to underlying condition with diabetic neuropathy, unspecified: Secondary | ICD-10-CM

## 2014-06-21 DIAGNOSIS — G4733 Obstructive sleep apnea (adult) (pediatric): Secondary | ICD-10-CM | POA: Diagnosis present

## 2014-06-21 DIAGNOSIS — M479 Spondylosis, unspecified: Secondary | ICD-10-CM | POA: Diagnosis present

## 2014-06-21 DIAGNOSIS — R339 Retention of urine, unspecified: Secondary | ICD-10-CM | POA: Diagnosis not present

## 2014-06-21 DIAGNOSIS — E43 Unspecified severe protein-calorie malnutrition: Secondary | ICD-10-CM | POA: Diagnosis present

## 2014-06-21 DIAGNOSIS — I10 Essential (primary) hypertension: Secondary | ICD-10-CM | POA: Diagnosis present

## 2014-06-21 DIAGNOSIS — Z7982 Long term (current) use of aspirin: Secondary | ICD-10-CM

## 2014-06-21 DIAGNOSIS — M549 Dorsalgia, unspecified: Secondary | ICD-10-CM

## 2014-06-21 DIAGNOSIS — Z888 Allergy status to other drugs, medicaments and biological substances status: Secondary | ICD-10-CM | POA: Diagnosis not present

## 2014-06-21 DIAGNOSIS — I951 Orthostatic hypotension: Secondary | ICD-10-CM

## 2014-06-21 DIAGNOSIS — G8929 Other chronic pain: Secondary | ICD-10-CM | POA: Diagnosis present

## 2014-06-21 DIAGNOSIS — Z794 Long term (current) use of insulin: Secondary | ICD-10-CM

## 2014-06-21 DIAGNOSIS — E1165 Type 2 diabetes mellitus with hyperglycemia: Secondary | ICD-10-CM

## 2014-06-21 DIAGNOSIS — J9601 Acute respiratory failure with hypoxia: Secondary | ICD-10-CM | POA: Diagnosis present

## 2014-06-21 DIAGNOSIS — Z6828 Body mass index (BMI) 28.0-28.9, adult: Secondary | ICD-10-CM

## 2014-06-21 DIAGNOSIS — J189 Pneumonia, unspecified organism: Secondary | ICD-10-CM | POA: Diagnosis present

## 2014-06-21 DIAGNOSIS — Z885 Allergy status to narcotic agent status: Secondary | ICD-10-CM | POA: Diagnosis not present

## 2014-06-21 DIAGNOSIS — E1142 Type 2 diabetes mellitus with diabetic polyneuropathy: Secondary | ICD-10-CM

## 2014-06-21 DIAGNOSIS — M546 Pain in thoracic spine: Secondary | ICD-10-CM

## 2014-06-21 DIAGNOSIS — E860 Dehydration: Secondary | ICD-10-CM | POA: Diagnosis present

## 2014-06-21 DIAGNOSIS — R531 Weakness: Secondary | ICD-10-CM | POA: Diagnosis not present

## 2014-06-21 DIAGNOSIS — Z79899 Other long term (current) drug therapy: Secondary | ICD-10-CM | POA: Diagnosis not present

## 2014-06-21 DIAGNOSIS — R1310 Dysphagia, unspecified: Secondary | ICD-10-CM | POA: Diagnosis present

## 2014-06-21 DIAGNOSIS — E785 Hyperlipidemia, unspecified: Secondary | ICD-10-CM | POA: Diagnosis present

## 2014-06-21 DIAGNOSIS — E1140 Type 2 diabetes mellitus with diabetic neuropathy, unspecified: Secondary | ICD-10-CM

## 2014-06-21 DIAGNOSIS — K3184 Gastroparesis: Secondary | ICD-10-CM | POA: Diagnosis present

## 2014-06-21 DIAGNOSIS — Z66 Do not resuscitate: Secondary | ICD-10-CM | POA: Diagnosis present

## 2014-06-21 DIAGNOSIS — R532 Functional quadriplegia: Secondary | ICD-10-CM | POA: Diagnosis present

## 2014-06-21 DIAGNOSIS — I459 Conduction disorder, unspecified: Secondary | ICD-10-CM | POA: Diagnosis present

## 2014-06-21 DIAGNOSIS — E876 Hypokalemia: Secondary | ICD-10-CM | POA: Diagnosis present

## 2014-06-21 DIAGNOSIS — Z79891 Long term (current) use of opiate analgesic: Secondary | ICD-10-CM

## 2014-06-21 DIAGNOSIS — K219 Gastro-esophageal reflux disease without esophagitis: Secondary | ICD-10-CM | POA: Diagnosis present

## 2014-06-21 DIAGNOSIS — R1110 Vomiting, unspecified: Secondary | ICD-10-CM

## 2014-06-21 DIAGNOSIS — Z9181 History of falling: Secondary | ICD-10-CM | POA: Diagnosis not present

## 2014-06-21 DIAGNOSIS — R52 Pain, unspecified: Secondary | ICD-10-CM

## 2014-06-21 DIAGNOSIS — H9190 Unspecified hearing loss, unspecified ear: Secondary | ICD-10-CM | POA: Diagnosis present

## 2014-06-21 DIAGNOSIS — E119 Type 2 diabetes mellitus without complications: Secondary | ICD-10-CM | POA: Diagnosis present

## 2014-06-21 DIAGNOSIS — F341 Dysthymic disorder: Secondary | ICD-10-CM

## 2014-06-21 DIAGNOSIS — K5900 Constipation, unspecified: Secondary | ICD-10-CM

## 2014-06-21 DIAGNOSIS — R1115 Cyclical vomiting syndrome unrelated to migraine: Secondary | ICD-10-CM

## 2014-06-21 DIAGNOSIS — D72829 Elevated white blood cell count, unspecified: Secondary | ICD-10-CM

## 2014-06-21 DIAGNOSIS — Z833 Family history of diabetes mellitus: Secondary | ICD-10-CM

## 2014-06-21 DIAGNOSIS — R194 Change in bowel habit: Secondary | ICD-10-CM

## 2014-06-21 LAB — CBC WITH DIFFERENTIAL/PLATELET
BASOS PCT: 0 % (ref 0–1)
Basophils Absolute: 0 10*3/uL (ref 0.0–0.1)
Eosinophils Absolute: 0 10*3/uL (ref 0.0–0.7)
Eosinophils Relative: 0 % (ref 0–5)
HCT: 47.1 % (ref 39.0–52.0)
HEMOGLOBIN: 17.1 g/dL — AB (ref 13.0–17.0)
LYMPHS PCT: 21 % (ref 12–46)
Lymphs Abs: 2.3 10*3/uL (ref 0.7–4.0)
MCH: 31.7 pg (ref 26.0–34.0)
MCHC: 36.3 g/dL — AB (ref 30.0–36.0)
MCV: 87.4 fL (ref 78.0–100.0)
MONOS PCT: 8 % (ref 3–12)
Monocytes Absolute: 0.9 10*3/uL (ref 0.1–1.0)
NEUTROS ABS: 7.5 10*3/uL (ref 1.7–7.7)
NEUTROS PCT: 71 % (ref 43–77)
Platelets: 233 10*3/uL (ref 150–400)
RBC: 5.39 MIL/uL (ref 4.22–5.81)
RDW: 12.8 % (ref 11.5–15.5)
WBC: 10.8 10*3/uL — AB (ref 4.0–10.5)

## 2014-06-21 LAB — COMPREHENSIVE METABOLIC PANEL
ALBUMIN: 4.7 g/dL (ref 3.5–5.2)
ALT: 22 U/L (ref 0–53)
AST: 34 U/L (ref 0–37)
Alkaline Phosphatase: 69 U/L (ref 39–117)
Anion gap: 18 — ABNORMAL HIGH (ref 5–15)
BILIRUBIN TOTAL: 1 mg/dL (ref 0.3–1.2)
BUN: 28 mg/dL — ABNORMAL HIGH (ref 6–23)
CO2: 23 mEq/L (ref 19–32)
Calcium: 10.2 mg/dL (ref 8.4–10.5)
Chloride: 99 mEq/L (ref 96–112)
Creatinine, Ser: 0.93 mg/dL (ref 0.50–1.35)
GFR calc Af Amer: >90 mL/min (ref >90)
GFR calc non Af Amer: 89 mL/min — ABNORMAL LOW (ref >90)
Glucose, Bld: 106 mg/dL — ABNORMAL HIGH (ref 70–99)
POTASSIUM: 4.3 meq/L (ref 3.7–5.3)
Sodium: 140 mEq/L (ref 137–147)
Total Protein: 8.9 g/dL — ABNORMAL HIGH (ref 6.0–8.3)

## 2014-06-21 LAB — GLUCOSE, CAPILLARY: Glucose-Capillary: 102 mg/dL — ABNORMAL HIGH (ref 70–99)

## 2014-06-21 MED ORDER — ENOXAPARIN SODIUM 40 MG/0.4ML ~~LOC~~ SOLN
40.0000 mg | SUBCUTANEOUS | Status: DC
  Administered 2014-06-21 – 2014-06-25 (×5): 40 mg via SUBCUTANEOUS
  Filled 2014-06-21 (×6): qty 0.4

## 2014-06-21 MED ORDER — FENTANYL CITRATE 0.05 MG/ML IJ SOLN
50.0000 ug | Freq: Once | INTRAMUSCULAR | Status: AC
  Administered 2014-06-21: 50 ug via INTRAVENOUS
  Filled 2014-06-21: qty 2

## 2014-06-21 MED ORDER — PROMETHAZINE HCL 25 MG/ML IJ SOLN
12.5000 mg | Freq: Once | INTRAMUSCULAR | Status: AC
  Administered 2014-06-21: 12.5 mg via INTRAMUSCULAR

## 2014-06-21 MED ORDER — DULOXETINE HCL 60 MG PO CPEP
90.0000 mg | ORAL_CAPSULE | Freq: Every day | ORAL | Status: DC
  Administered 2014-06-23 – 2014-06-28 (×5): 90 mg via ORAL
  Filled 2014-06-21 (×7): qty 1

## 2014-06-21 MED ORDER — METOCLOPRAMIDE HCL 5 MG/ML IJ SOLN
5.0000 mg | Freq: Four times a day (QID) | INTRAMUSCULAR | Status: DC | PRN
  Filled 2014-06-21: qty 2

## 2014-06-21 MED ORDER — LORAZEPAM 2 MG/ML IJ SOLN
0.5000 mg | Freq: Once | INTRAMUSCULAR | Status: AC
  Administered 2014-06-21: 0.5 mg via INTRAVENOUS
  Filled 2014-06-21: qty 1

## 2014-06-21 MED ORDER — SODIUM CHLORIDE 0.9 % IV BOLUS (SEPSIS)
1000.0000 mL | Freq: Once | INTRAVENOUS | Status: AC
  Administered 2014-06-21: 1000 mL via INTRAVENOUS

## 2014-06-21 MED ORDER — DOCUSATE SODIUM 100 MG PO CAPS
100.0000 mg | ORAL_CAPSULE | Freq: Two times a day (BID) | ORAL | Status: DC
  Administered 2014-06-21 – 2014-06-24 (×6): 100 mg via ORAL
  Filled 2014-06-21 (×9): qty 1

## 2014-06-21 MED ORDER — OXYCODONE-ACETAMINOPHEN 5-325 MG PO TABS
1.0000 | ORAL_TABLET | Freq: Four times a day (QID) | ORAL | Status: DC | PRN
  Administered 2014-06-21: 1 via ORAL
  Filled 2014-06-21: qty 1

## 2014-06-21 MED ORDER — SODIUM CHLORIDE 0.9 % IV SOLN
INTRAVENOUS | Status: AC
  Administered 2014-06-21: 21:00:00 via INTRAVENOUS

## 2014-06-21 MED ORDER — METHYLPHENIDATE HCL ER (OSM) 36 MG PO TBCR
54.0000 mg | EXTENDED_RELEASE_TABLET | Freq: Every day | ORAL | Status: DC
  Administered 2014-06-28: 54 mg via ORAL
  Filled 2014-06-21 (×2): qty 1

## 2014-06-21 MED ORDER — ONDANSETRON HCL 4 MG PO TABS: 4.0000 mg | ORAL_TABLET | Freq: Four times a day (QID) | ORAL | Status: DC | PRN

## 2014-06-21 MED ORDER — OXYCODONE-ACETAMINOPHEN 7.5-325 MG PO TABS: 1.0000 | ORAL_TABLET | Freq: Four times a day (QID) | ORAL | Status: DC | PRN

## 2014-06-21 MED ORDER — CLONAZEPAM 0.5 MG PO TABS
0.5000 mg | ORAL_TABLET | Freq: Two times a day (BID) | ORAL | Status: DC | PRN
  Administered 2014-06-21: 0.5 mg via ORAL
  Filled 2014-06-21: qty 1

## 2014-06-21 MED ORDER — OXYCODONE HCL 5 MG PO TABS
2.5000 mg | ORAL_TABLET | Freq: Four times a day (QID) | ORAL | Status: DC | PRN
  Administered 2014-06-21: 2.5 mg via ORAL
  Filled 2014-06-21: qty 1

## 2014-06-21 MED ORDER — INSULIN ASPART 100 UNIT/ML ~~LOC~~ SOLN
0.0000 [IU] | SUBCUTANEOUS | Status: DC
  Administered 2014-06-23 – 2014-06-25 (×8): 1 [IU] via SUBCUTANEOUS
  Administered 2014-06-25: 2 [IU] via SUBCUTANEOUS
  Administered 2014-06-25 (×2): 1 [IU] via SUBCUTANEOUS
  Administered 2014-06-25 – 2014-06-26 (×2): 2 [IU] via SUBCUTANEOUS
  Administered 2014-06-26 (×3): 1 [IU] via SUBCUTANEOUS
  Administered 2014-06-26 – 2014-06-27 (×2): 2 [IU] via SUBCUTANEOUS
  Administered 2014-06-27 – 2014-06-28 (×5): 1 [IU] via SUBCUTANEOUS

## 2014-06-21 MED ORDER — ONDANSETRON HCL 4 MG/2ML IJ SOLN
4.0000 mg | Freq: Four times a day (QID) | INTRAMUSCULAR | Status: DC | PRN
  Administered 2014-06-22 – 2014-06-25 (×6): 4 mg via INTRAVENOUS
  Filled 2014-06-21 (×6): qty 2

## 2014-06-21 MED ORDER — ATORVASTATIN CALCIUM 10 MG PO TABS
10.0000 mg | ORAL_TABLET | Freq: Every day | ORAL | Status: DC
  Administered 2014-06-21: 10 mg via ORAL
  Filled 2014-06-21 (×2): qty 1

## 2014-06-21 MED ORDER — PREGABALIN 75 MG PO CAPS
200.0000 mg | ORAL_CAPSULE | Freq: Two times a day (BID) | ORAL | Status: DC
  Administered 2014-06-22 – 2014-06-28 (×9): 200 mg via ORAL
  Filled 2014-06-21: qty 1
  Filled 2014-06-21 (×4): qty 2
  Filled 2014-06-21: qty 1
  Filled 2014-06-21 (×8): qty 2
  Filled 2014-06-21: qty 1
  Filled 2014-06-21 (×4): qty 2

## 2014-06-21 MED ORDER — ACETAMINOPHEN 325 MG PO TABS: 650.0000 mg | ORAL_TABLET | Freq: Four times a day (QID) | ORAL | Status: DC | PRN

## 2014-06-21 MED ORDER — ACETAMINOPHEN 650 MG RE SUPP: 650.0000 mg | Freq: Four times a day (QID) | RECTAL | Status: DC | PRN

## 2014-06-21 MED ORDER — ALLOPURINOL 100 MG PO TABS
200.0000 mg | ORAL_TABLET | Freq: Every day | ORAL | Status: DC
  Administered 2014-06-23 – 2014-06-28 (×4): 200 mg via ORAL
  Filled 2014-06-21 (×7): qty 2

## 2014-06-21 NOTE — Assessment & Plan Note (Signed)
Marked today likely volume related or neuritic; needs further evaluation, doubt cardiac

## 2014-06-21 NOTE — H&P (Signed)
PCP: Cathlean Cower, MD  [facility]  Chief Complaint:  Back pain, nausea and vomiting  HPI: Frank [name] is a 58 y.o. male   has a past medical history of COLONIC POLYPS (02/01/2008); DIABETES MELLITUS, TYPE II (05/20/2010); HYPERLIPIDEMIA (10/09/2008); ANXIETY DEPRESSION (02/01/2008); ERECTILE DYSFUNCTION (10/09/2008); ADD (10/09/2008); SLEEP APNEA, OBSTRUCTIVE (02/01/2008); MORTON'S NEUROMA (05/20/2010); PERIPHERAL NEUROPATHY (05/20/2010); Other specified forms of hearing loss (06/27/2009); HYPERTENSION (10/09/2008); HEMORRHOIDS (02/01/2008); ALLERGIC RHINITIS (10/09/2008); Stricture and stenosis of esophagus (02/02/2008); GERD (02/01/2008); HIATAL HERNIA (02/01/2008); ERECTILE DYSFUNCTION, ORGANIC (05/20/2010); WRIST PAIN, LEFT (12/05/2009); FOOT PAIN, LEFT (05/20/2010); PERIPHERAL EDEMA (05/20/2010); DYSPNEA (03/12/2010); Abdominal pain, unspecified site (01/19/2009); Abdominal pain, left lower quadrant (06/06/2010); and Type II or unspecified type diabetes mellitus without mention of complication, uncontrolled (11/14/2010).   Presented with  Patient have had a fall in end of July and have sustained ankle fracture followed by sports medicine. Patient have had diminished ambulation ever since stating due to pain. His been taking prescribed opioids for pain management. Pateint says he was moving some furniture and started to have worsening thoracic pain. He has been having worsening nausea and vomiting for past few days. Reports not able to keep anything down. Denies any diarrhea. Denies any chest pain no fevers or chills. Patient have been somewhat more lethargic. Reports generalized pain and inability to find a comfortable position.  Patient reports some tingling in upper extremities bilaterally as well as diminished sensation in the lower extremities that seems to be more of a chronic complaint due to history of diabetes induced neuropathy.  Patient states he has trouble sitting up but able to do so  spontaneously.  Reports inability to stand up and requests using a foley catheter.  History is difficult to obtain as patient states he is "too weak to talk". Patient requested other people speak for him.  Patient endorses low extremity weakness but moving all extremities spontaneously.  When asked about his back pain it seems to be localized under her right scapula. No spinal tenderness noted. Hospitalist was called for admission for not able to walk and continuous nausea and vomiting with dehydration.   Review of Systems:    Pertinent positives include:  Back pain, constipation, nausea, vomiting,  Constitutional:  No weight loss, night sweats, Fevers, chills, fatigue, weight loss  HEENT:  No headaches, Difficulty swallowing,Tooth/dental problems,Sore throat,  No sneezing, itching, ear ache, nasal congestion, post nasal drip,  Cardio-vascular:  No chest pain, Orthopnea, PND, anasarca, dizziness, palpitations.no Bilateral lower extremity swelling  GI:  No heartburn, indigestion, abdominal pain,  diarrhea, change in bowel habits, loss of appetite, melena, blood in stool, hematemesis Resp:  no shortness of breath at rest. No dyspnea on exertion, No excess mucus, no productive cough, No non-productive cough, No coughing up of blood.No change in color of mucus.No wheezing. Skin:  no rash or lesions. No jaundice GU:  no dysuria, change in color of urine, no urgency or frequency. No straining to urinate.  No flank pain.  Musculoskeletal:  No joint pain or no joint swelling. No decreased range of motion. No back pain.  Psych:  No change in mood or affect. No depression or anxiety. No memory loss.  Neuro: no localizing neurological complaints, no tingling, no weakness, no double vision, no gait abnormality, no slurred speech, no confusion  Otherwise ROS are negative except for above, 10 systems were reviewed  Past Medical History: Past Medical History  Diagnosis Date  . COLONIC POLYPS  02/01/2008  . DIABETES MELLITUS,  TYPE II 05/20/2010  . HYPERLIPIDEMIA 10/09/2008  . ANXIETY DEPRESSION 02/01/2008  . ERECTILE DYSFUNCTION 10/09/2008  . ADD 10/09/2008  . SLEEP APNEA, OBSTRUCTIVE 02/01/2008  . MORTON'S NEUROMA 05/20/2010  . PERIPHERAL NEUROPATHY 05/20/2010  . Other specified forms of hearing loss 06/27/2009  . HYPERTENSION 10/09/2008  . HEMORRHOIDS 02/01/2008  . ALLERGIC RHINITIS 10/09/2008  . Stricture and stenosis of esophagus 02/02/2008  . GERD 02/01/2008  . HIATAL HERNIA 02/01/2008  . ERECTILE DYSFUNCTION, ORGANIC 05/20/2010  . WRIST PAIN, LEFT 12/05/2009  . FOOT PAIN, LEFT 05/20/2010  . PERIPHERAL EDEMA 05/20/2010  . DYSPNEA 03/12/2010  . Abdominal pain, unspecified site 01/19/2009  . Abdominal pain, left lower quadrant 06/06/2010  . Type II or unspecified type diabetes mellitus without mention of complication, uncontrolled 11/14/2010   Past Surgical History  Procedure Laterality Date  . Carpal tunnel release    . Rotator cuff repair    . Esophageal dilation  july 2009  . Eye surgery       Medications: Prior to Admission medications   Medication Sig Start Date End Date Taking? Authorizing Provider  allopurinol (ZYLOPRIM) 100 MG tablet Take 2 tablets (200 mg total) by mouth daily. 03/27/14   Lyndal Pulley, DO  amLODipine (NORVASC) 10 MG tablet Take 1 tablet (10 mg total) by mouth daily. 03/27/14   Lyndal Pulley, DO  aspirin (ASPIRIN EC) 81 MG EC tablet Take 81 mg by mouth daily.      Historical Provider, MD  atorvastatin (LIPITOR) 10 MG tablet TAKE 1 TABLET BY MOUTH EVERY DAY 07/26/13   Biagio Borg, MD  atorvastatin (LIPITOR) 10 MG tablet Take 10 mg by mouth daily.    Historical Provider, MD  clonazePAM (KLONOPIN) 0.5 MG tablet TAKE 1 TABLET BY MOUTH TWICE DAILY AS NEEDED 04/05/14   Biagio Borg, MD  clonazePAM (KLONOPIN) 0.5 MG tablet Take 0.5 mg by mouth 2 (two) times daily as needed for anxiety.    Historical Provider, MD  colchicine 0.6 MG tablet Take 1 tablet (0.6 mg  total) by mouth 2 (two) times daily. 03/27/14   Lyndal Pulley, DO  DULoxetine (CYMBALTA) 20 MG capsule Take 20 mg by mouth 3 (three) times daily.      Historical Provider, MD  DULoxetine (CYMBALTA) 30 MG capsule Take 90 mg by mouth daily.     Historical Provider, MD  glipiZIDE (GLUCOTROL XL) 2.5 MG 24 hr tablet Take 1 tablet (2.5 mg total) by mouth daily with breakfast. 05/17/14   Biagio Borg, MD  glucose blood (FREESTYLE TEST STRIPS) test strip Use as instructed 06/04/12   Biagio Borg, MD  influenza vac recombinant HA trivalent (FLUBLOK) injection Inject 0.5 mLs into the muscle once.    Historical Provider, MD  meloxicam (MOBIC) 15 MG tablet Take 1 tablet (15 mg total) by mouth daily. 03/06/14   Zenaida Niece, PA-C  methylphenidate 54 MG PO CR tablet 1 tab by mouth twice per day - 04/05/14   Biagio Borg, MD  NUCYNTA 100 MG TABS Take 100 mg by mouth 3 (three) times daily.  05/12/13   Historical Provider, MD  oxyCODONE-acetaminophen (PERCOCET) 7.5-325 MG per tablet Take 1 tablet by mouth every 6 (six) hours as needed for pain. 06/20/14   Virgel Manifold, MD  pneumococcal 13-valent conjugate vaccine (PREVNAR 13) SUSP injection Inject 0.5 mLs into the muscle once.    Historical Provider, MD  pregabalin (LYRICA) 100 MG capsule Take 100 mg by mouth 3 (three) times  daily.      Historical Provider, MD  pregabalin (LYRICA) 200 MG capsule Take 200 mg by mouth 2 (two) times daily.    Historical Provider, MD    Allergies:   Allergies  Allergen Reactions  . Codeine     REACTION: Itching  . Metformin And Related     GI upset, diarrhea    Social History:  Ambulatory   Independently  Lives at home With partner   reports that he has never smoked. He has never used smokeless tobacco. He reports that he drinks alcohol. He reports that he uses illicit drugs (Methaqualone).    Family History: family history includes Depression in his mother; Diabetes in his brother and mother; Heart disease in his  mother; Hyperlipidemia in his mother. There is no history of Colon cancer.   Physical Exam: Patient Vitals for the past 24 hrs:  BP Temp Temp src Pulse Resp SpO2  06/21/14 1700 186/88 mmHg 97.5 F (36.4 C) Oral 100 14 98 %    1. General:  in No Acute distress 2. Psychological: Alert and   Oriented 3. Head/ENT:    Dry Mucous Membranes                          Head Non traumatic, neck supple                          Normal   Dentition 4. SKIN:  decreased Skin turgor,  Skin clean Dry and intact no rash 5. Heart: Regular rate and rhythm no Murmur, Rub or gallop 6. Lungs: Clear to auscultation bilaterally, no wheezes or crackles   7. Abdomen: Soft, non-tender, Non distended 8. Lower extremities: no clubbing, cyanosis, or edema 9. Neurologically strength 5 out of 5 in all 4 extremities cranial nerves II through XII intact  Poor effort noted 10. MSK: Normal range of motion  body mass index is unknown because there is no weight on file.   Labs on Admission:   Results for orders placed or performed during the hospital encounter of 06/21/14 (from the past 24 hour(s))  CBC with Differential     Status: Abnormal   Collection Time: 06/21/14  6:26 PM  Result Value Ref Range   WBC 10.8 (H) 4.0 - 10.5 K/uL   RBC 5.39 4.22 - 5.81 MIL/uL   Hemoglobin 17.1 (H) 13.0 - 17.0 g/dL   HCT 47.1 39.0 - 52.0 %   MCV 87.4 78.0 - 100.0 fL   MCH 31.7 26.0 - 34.0 pg   MCHC 36.3 (H) 30.0 - 36.0 g/dL   RDW 12.8 11.5 - 15.5 %   Platelets 233 150 - 400 K/uL   Neutrophils Relative % 71 43 - 77 %   Neutro Abs 7.5 1.7 - 7.7 K/uL   Lymphocytes Relative 21 12 - 46 %   Lymphs Abs 2.3 0.7 - 4.0 K/uL   Monocytes Relative 8 3 - 12 %   Monocytes Absolute 0.9 0.1 - 1.0 K/uL   Eosinophils Relative 0 0 - 5 %   Eosinophils Absolute 0.0 0.0 - 0.7 K/uL   Basophils Relative 0 0 - 1 %   Basophils Absolute 0.0 0.0 - 0.1 K/uL    Lab Results  Component Value Date   HGBA1C 6.2 05/04/2014    Estimated Creatinine  Clearance: 101.7 mL/min (by C-G formula based on Cr of 0.86).  BNP (last 3 results) No results for  input(s): PROBNP in the last 8760 hours.  Other results:  I have pearsonaly reviewed this: ECG REPORT  Rate:86  Rhythm: sinus rhythm with some conduction delay ST&T Change: no ischemic changes   There were no vitals filed for this visit.   Cultures: No results found for: SDES, SPECREQUEST, CULT, REPTSTATUS   Radiological Exams on Admission: Mr Lumbar Spine Wo Contrast  06/20/2014   CLINICAL DATA:  Low back pain radiating into both legs, worse on the right. Evaluate for disc herniation. Initial encounter  EXAM: MRI LUMBAR SPINE WITHOUT CONTRAST  TECHNIQUE: Multiplanar, multisequence MR imaging of the lumbar spine was performed. No intravenous contrast was administered.  COMPARISON:  CTs of the chest, abdomen and pelvis 06/17/2014.  FINDINGS: There is transitional lumbosacral anatomy. As correlated with prior CT, there are small ribs at T12 and a partially sacralized L5 segment. The alignment is normal. There is no evidence of fracture or pars defect. There are scattered type 2 endplate degenerative changes, greatest at L1-2 and L3-4.  The conus medullaris extends to the T12-L1 level and appears normal. No paraspinal abnormalities are identified.  There are small anterior osteophytes within the lower thoracic spine, but no disc herniation, spinal stenosis or nerve root encroachment.  L1-2: Mild disc bulging with anterior osteophytes and mild facet hypertrophy. No spinal stenosis or nerve root encroachment.  L2-3:  Normal interspace.  L3-4: There is mild loss of disc height with annular bulging and endplate degeneration. There is mild facet and ligamentous hypertrophy. These factors contribute to mild lateral recess and foraminal stenosis bilaterally. There is no focal disc herniation or definite nerve root encroachment.  L4-5: Disc height is relatively maintained. There is mild disc bulging  asymmetric to the right and mild bilateral facet hypertrophy. There is minimal narrowing of the right foramen without definite nerve root encroachment.  L5-S1: Axial images were not obtained through this vestigial disc space. The disc space appears fused. There is no spinal stenosis or nerve root encroachment.  IMPRESSION: 1. Transitional lumbosacral anatomy.  L5 is largely sacralized. 2. Mild lumbar spine degenerative changes, greatest at L3-4 where there is annular disc bulging, facet and ligamentous hypertrophy contributing to mild lateral recess and foraminal stenosis bilaterally. 3. No focal disc herniation or definite nerve root encroachment.   Electronically Signed   By: Camie Patience M.D.   On: 06/20/2014 10:22    Chart has been reviewed  Assessment/Plan  58 year old gentleman with chronic back pain with acute on chronic pain complaints. Dehydration due to nausea and vomiting and generalized fatigue resulting in frequent falls.  Admitted for fluid resuscitation  Present on Admission:  . Nausea and vomiting - etiology unclear could be gastroparesis induced by chronic narcotic use. We will control with Reglan and try to decrease narcotics . Essential hypertension - we'll hold Norvasc, given some low blood pressures in emergency department . Chronic back pain greater than 3 months duration - hold nucynta, give percocet prn, patient will likely benefit from pain clinic referral Back pain- no tenderness to palpation noted LFTs within normal limits patient have had extensive imaging including CT of the spine as well as MRI of the lumbar spine done.  We'll rehydrate and reassess. . Dehydration - administrative fluids and check orthostatics in the morning   Prophylaxis: Lovenox, CODE STATUS: DNR/DNI  As per patient's request  Other plan as per orders.  I have spent a total of 55 min on this admission  Frank [name] 06/21/2014, 6:47 PM  [facility]  Pager  [phone]   after 2 AM  please page floor coverage PA If 7AM-7PM, please contact the day team taking care of the patient  [email or web address]  Password [email or web address]

## 2014-06-21 NOTE — Addendum Note (Signed)
Addended by: Sharon Seller B on: 06/21/2014 04:42 PM   Modules accepted: Orders

## 2014-06-21 NOTE — Telephone Encounter (Signed)
Patient has scheduled appt. Today 06/21/14

## 2014-06-21 NOTE — Telephone Encounter (Signed)
Patient has appt. With PCP today 06/21/14 to discuss.

## 2014-06-21 NOTE — ED Notes (Signed)
Pt presents to ED w/ partner.  Sent here from [place].  Has been having NVD since Sunday.  Now presents w/ no feeling from knees down.  Pt able to move lower extremities purposefully.  States there is no sensation below knees.  His doctor sent him over here to "be tested for gastroparesis and admitted".  Pt's behavior is strange, almost dystonic in triage.

## 2014-06-21 NOTE — Assessment & Plan Note (Signed)
Marked orthostatic, c/w dehydration, needs inpt IVF management, controll symtpoms

## 2014-06-21 NOTE — ED Provider Notes (Signed)
CSN: [id number]     Arrival date & time 06/21/14  1649 History   First MD Initiated Contact with Patient 06/21/14 1746     Chief Complaint  Patient presents with  . Abdominal Pain  . Numbness     (Consider location/radiation/quality/duration/timing/severity/associated sxs/prior Treatment) Patient is a 58 y.o. male presenting with abdominal pain. The history is provided by the patient.  Abdominal Pain Associated symptoms: fatigue, fever, nausea and vomiting   Associated symptoms: no chest pain, no diarrhea and no shortness of breath   patient presents with back pain nausea vomiting and numbness and weakness. Has been evaluated for the same in the ER and by primary care doctor. Sent in by primary care doctor for admission. Was concern from PCP of gastroparesis. Recently seen in the ER and had lumbar spine MRI and also recently had CT angiogram of the chest and abdomen. Both which did not see any gross abnormality. Patient has been eating less. Feels dehydrated. Initially had some diarrhea but it is resolved.  Past Medical History  Diagnosis Date  . COLONIC POLYPS 02/01/2008  . DIABETES MELLITUS, TYPE II 05/20/2010  . HYPERLIPIDEMIA 10/09/2008  . ANXIETY DEPRESSION 02/01/2008  . ERECTILE DYSFUNCTION 10/09/2008  . ADD 10/09/2008  . SLEEP APNEA, OBSTRUCTIVE 02/01/2008  . MORTON'S NEUROMA 05/20/2010  . PERIPHERAL NEUROPATHY 05/20/2010  . Other specified forms of hearing loss 06/27/2009  . HYPERTENSION 10/09/2008  . HEMORRHOIDS 02/01/2008  . ALLERGIC RHINITIS 10/09/2008  . Stricture and stenosis of esophagus 02/02/2008  . GERD 02/01/2008  . HIATAL HERNIA 02/01/2008  . ERECTILE DYSFUNCTION, ORGANIC 05/20/2010  . WRIST PAIN, LEFT 12/05/2009  . FOOT PAIN, LEFT 05/20/2010  . PERIPHERAL EDEMA 05/20/2010  . DYSPNEA 03/12/2010  . Abdominal pain, unspecified site 01/19/2009  . Abdominal pain, left lower quadrant 06/06/2010  . Type II or unspecified type diabetes mellitus without mention of complication,  uncontrolled 11/14/2010   Past Surgical History  Procedure Laterality Date  . Carpal tunnel release    . Rotator cuff repair    . Esophageal dilation  july 2009  . Eye surgery     Family History  Problem Relation Age of Onset  . Diabetes Mother   . Heart disease Mother   . Hyperlipidemia Mother   . Depression Mother   . Diabetes Brother   . Colon cancer Neg Hx    History  Substance Use Topics  . Smoking status: Never Smoker   . Smokeless tobacco: Never Used  . Alcohol Use: Yes     Comment: occasional    Review of Systems  Constitutional: Positive for fever, appetite change and fatigue. Negative for activity change.  Eyes: Negative for pain.  Respiratory: Negative for chest tightness and shortness of breath.   Cardiovascular: Negative for chest pain and leg swelling.  Gastrointestinal: Positive for nausea, vomiting and abdominal pain. Negative for diarrhea.  Genitourinary: Negative for flank pain.  Musculoskeletal: Negative for back pain and neck stiffness.  Skin: Negative for rash.  Neurological: Positive for weakness, numbness and headaches.  Psychiatric/Behavioral: Negative for behavioral problems.      Allergies  Codeine and Metformin and related  Home Medications   Prior to Admission medications   Medication Sig Start Date End Date Taking? Authorizing Provider  allopurinol (ZYLOPRIM) 100 MG tablet Take 2 tablets (200 mg total) by mouth daily. 03/27/14   Lyndal Pulley, DO  amLODipine (NORVASC) 10 MG tablet Take 1 tablet (10 mg total) by mouth daily. 03/27/14   Lyndal Pulley,  DO  aspirin (ASPIRIN EC) 81 MG EC tablet Take 81 mg by mouth daily.      Historical Provider, MD  atorvastatin (LIPITOR) 10 MG tablet TAKE 1 TABLET BY MOUTH EVERY DAY 07/26/13   Biagio Borg, MD  atorvastatin (LIPITOR) 10 MG tablet Take 10 mg by mouth daily.    Historical Provider, MD  clonazePAM (KLONOPIN) 0.5 MG tablet TAKE 1 TABLET BY MOUTH TWICE DAILY AS NEEDED 04/05/14   Biagio Borg,  MD  clonazePAM (KLONOPIN) 0.5 MG tablet Take 0.5 mg by mouth 2 (two) times daily as needed for anxiety.    Historical Provider, MD  colchicine 0.6 MG tablet Take 1 tablet (0.6 mg total) by mouth 2 (two) times daily. 03/27/14   Lyndal Pulley, DO  DULoxetine (CYMBALTA) 20 MG capsule Take 20 mg by mouth 3 (three) times daily.      Historical Provider, MD  DULoxetine (CYMBALTA) 30 MG capsule Take 90 mg by mouth daily.     Historical Provider, MD  glipiZIDE (GLUCOTROL XL) 2.5 MG 24 hr tablet Take 1 tablet (2.5 mg total) by mouth daily with breakfast. 05/17/14   Biagio Borg, MD  glucose blood (FREESTYLE TEST STRIPS) test strip Use as instructed 06/04/12   Biagio Borg, MD  influenza vac recombinant HA trivalent (FLUBLOK) injection Inject 0.5 mLs into the muscle once.    Historical Provider, MD  meloxicam (MOBIC) 15 MG tablet Take 1 tablet (15 mg total) by mouth daily. 03/06/14   Zenaida Niece, PA-C  methylphenidate 54 MG PO CR tablet 1 tab by mouth twice per day - 04/05/14   Biagio Borg, MD  NUCYNTA 100 MG TABS Take 100 mg by mouth 3 (three) times daily.  05/12/13   Historical Provider, MD  oxyCODONE-acetaminophen (PERCOCET) 7.5-325 MG per tablet Take 1 tablet by mouth every 6 (six) hours as needed for pain. 06/20/14   Virgel Manifold, MD  pneumococcal 13-valent conjugate vaccine (PREVNAR 13) SUSP injection Inject 0.5 mLs into the muscle once.    Historical Provider, MD  pregabalin (LYRICA) 100 MG capsule Take 100 mg by mouth 3 (three) times daily.      Historical Provider, MD  pregabalin (LYRICA) 200 MG capsule Take 200 mg by mouth 2 (two) times daily.    Historical Provider, MD   BP 113/54 mmHg  Pulse 63  Temp(Src) 97.7 F (36.5 C) (Oral)  Resp 18  Ht 5\' 9"  (1.753 m)  Wt 195 lb (88.451 kg)  BMI 28.78 kg/m2  SpO2 99% Physical Exam  Constitutional: He is oriented to person, place, and time. He appears well-developed.  HENT:   dry mucous membranes  Eyes: EOM are normal.  Cardiovascular: Normal  rate and regular rhythm.   Pulmonary/Chest: Effort normal and breath sounds normal.  Abdominal: Soft.  Musculoskeletal: Normal range of motion. He exhibits tenderness.  Tenderness to right ankle.  Neurological: He is alert and oriented to person, place, and time.  Patient claims he cannot move his legs, however he will spontaneously lift them up on both sides. Patient states also that he is too weak to sit up but is also set up on command. Patient with subjectively decreased sensation in bilateral hands and bilateral lower legs. Face symmetric.  Skin: Skin is warm.  Psychiatric:  Somewhat depressed affect    ED Course  Procedures (including critical care time) Labs Review Labs Reviewed  CBC WITH DIFFERENTIAL - Abnormal; Notable for the following:    WBC 10.8 (*)  Hemoglobin 17.1 (*)    MCHC 36.3 (*)    All other components within normal limits  COMPREHENSIVE METABOLIC PANEL - Abnormal; Notable for the following:    Glucose, Bld 106 (*)    BUN 28 (*)    Total Protein 8.9 (*)    GFR calc non Af Amer 89 (*)    Anion gap 18 (*)    All other components within normal limits  GLUCOSE, CAPILLARY - Abnormal; Notable for the following:    Glucose-Capillary 102 (*)    All other components within normal limits  HEMOGLOBIN A1C  MAGNESIUM  PHOSPHORUS  TSH  COMPREHENSIVE METABOLIC PANEL  CBC    Imaging Review Dg Abd 1 View  06/21/2014   CLINICAL DATA:  Constipation.  Generalized abdominal pain.  EXAM: ABDOMEN - 1 VIEW  COMPARISON:  06/17/2014  FINDINGS: Moderate stool burden throughout the colon. Nonobstructive bowel gas pattern. No free air. No organomegaly or suspicious calcification. Visualized lung bases are clear.  IMPRESSION: Moderate stool burden.  No acute findings.   Electronically Signed   By: Rolm Baptise M.D.   On: 06/21/2014 21:11   Mr Lumbar Spine Wo Contrast  06/20/2014   CLINICAL DATA:  Low back pain radiating into both legs, worse on the right. Evaluate for disc  herniation. Initial encounter  EXAM: MRI LUMBAR SPINE WITHOUT CONTRAST  TECHNIQUE: Multiplanar, multisequence MR imaging of the lumbar spine was performed. No intravenous contrast was administered.  COMPARISON:  CTs of the chest, abdomen and pelvis 06/17/2014.  FINDINGS: There is transitional lumbosacral anatomy. As correlated with prior CT, there are small ribs at T12 and a partially sacralized L5 segment. The alignment is normal. There is no evidence of fracture or pars defect. There are scattered type 2 endplate degenerative changes, greatest at L1-2 and L3-4.  The conus medullaris extends to the T12-L1 level and appears normal. No paraspinal abnormalities are identified.  There are small anterior osteophytes within the lower thoracic spine, but no disc herniation, spinal stenosis or nerve root encroachment.  L1-2: Mild disc bulging with anterior osteophytes and mild facet hypertrophy. No spinal stenosis or nerve root encroachment.  L2-3:  Normal interspace.  L3-4: There is mild loss of disc height with annular bulging and endplate degeneration. There is mild facet and ligamentous hypertrophy. These factors contribute to mild lateral recess and foraminal stenosis bilaterally. There is no focal disc herniation or definite nerve root encroachment.  L4-5: Disc height is relatively maintained. There is mild disc bulging asymmetric to the right and mild bilateral facet hypertrophy. There is minimal narrowing of the right foramen without definite nerve root encroachment.  L5-S1: Axial images were not obtained through this vestigial disc space. The disc space appears fused. There is no spinal stenosis or nerve root encroachment.  IMPRESSION: 1. Transitional lumbosacral anatomy.  L5 is largely sacralized. 2. Mild lumbar spine degenerative changes, greatest at L3-4 where there is annular disc bulging, facet and ligamentous hypertrophy contributing to mild lateral recess and foraminal stenosis bilaterally. 3. No focal disc  herniation or definite nerve root encroachment.   Electronically Signed   By: Camie Patience M.D.   On: 06/20/2014 10:22     EKG Interpretation None      MDM   Final diagnoses:  Intractable vomiting with nausea, vomiting of unspecified type  Midline thoracic back pain  General weakness    Patient with nausea and vomiting. Thoracic back pain but also states weakness in his arms and legs. Somewhat inconsistent neurologic  exam. Recent workup for same. Will admit to internal medicine.    Jasper Riling. Alvino Chapel, MD 06/21/14 [phone]

## 2014-06-21 NOTE — ED Notes (Signed)
MD at bedside. 

## 2014-06-21 NOTE — Assessment & Plan Note (Addendum)
Persistent, now orthostatic, recent labs and imaging ok but I ? Gastroparesis with hx of DM; given his situation in toto he should be admitted for IV reglan trial, IVF's, and eval for gastroparesis, consider GI consult

## 2014-06-21 NOTE — Telephone Encounter (Signed)
Patient Information:  Caller Name: [name]  Phone: [phone]  Patient: Frank [name], Frank [name]  Gender: Male  DOB: 05/26/1956  Age: 58 Years  PCP: Cathlean Cower (Adults only)  Office Follow Up:  Does the office need to follow up with this patient?: No  Instructions For The Office: N/A  RN Note:  Per disposition and standing orders contacted the office and spoke with Shirlean Mylar, per [name] appt scheduled for to 1730, 06/21/14 with [name].  Symptoms  Reason For Call & Symptoms: Pt's partner states pt was seen in the ED 06/17/14 and again 06/20/14, no diagnosis for the abdominal pain, chest, back pain..  Pt's partner/Mike reports pain 10 on pain scale 0-10, headache, vomiting. [name] reports pt fell last night due to weakness.  Reviewed Health History In EMR: Yes  Reviewed Medications In EMR: Yes  Reviewed Allergies In EMR: Yes  Reviewed Surgeries / Procedures: Yes  Date of Onset of Symptoms: 06/17/2014  Guideline(s) Used:  Abdominal Pain - Male  Back Pain  Chest Pain  Disposition Per Guideline:   Go to ED Now  Reason For Disposition Reached:   Recent illness requiring prolonged bed rest (i.e., immobilization)  Advice Given:  N/A  Patient Will Follow Care Advice:  YES

## 2014-06-21 NOTE — Progress Notes (Signed)
Pre visit review using our clinic review tool, if applicable. No additional management support is needed unless otherwise documented below in the visit note. 

## 2014-06-21 NOTE — Assessment & Plan Note (Signed)
Unclear signicant, follow

## 2014-06-21 NOTE — Patient Instructions (Signed)
Your blood pressure is remarkably low on standing today  You should go now to the ER for further evaluation

## 2014-06-21 NOTE — Assessment & Plan Note (Signed)
Likely related to the orthostasis, but cant r/o neuritic, consider neuro consult

## 2014-06-21 NOTE — Progress Notes (Signed)
Subjective:    Patient ID: Frank [name], male    DOB: [birth year], 58 y.o.   MRN: [id number]  HPI  Here with partner, after eval x 2 recent in ER nov 7 and 10, unfortuanately has become progressively worse with respect to cont'd mid ? Thoracic back pain (points there today), intractable n/v to water x 3 -4 days, dizziness, marked general weakness, now high risk for fall, can only stand with assist, still mentions LE numbness, and decreased BM freq for several days, all in setting of DM, no hx of t-spine abnormal or gastroparesis in past.  Recent extensive labs includiing UA and HIV neg, CT chest/abd/pelvis neg for acute, also Lumbar CT without significant neural involvement.  No fever, at times has seemed somewhat confused, but is able to tell me name, place, year, president today.   Past Medical History  Diagnosis Date  . COLONIC POLYPS 02/01/2008  . DIABETES MELLITUS, TYPE II 05/20/2010  . HYPERLIPIDEMIA 10/09/2008  . ANXIETY DEPRESSION 02/01/2008  . ERECTILE DYSFUNCTION 10/09/2008  . ADD 10/09/2008  . SLEEP APNEA, OBSTRUCTIVE 02/01/2008  . MORTON'S NEUROMA 05/20/2010  . PERIPHERAL NEUROPATHY 05/20/2010  . Other specified forms of hearing loss 06/27/2009  . HYPERTENSION 10/09/2008  . HEMORRHOIDS 02/01/2008  . ALLERGIC RHINITIS 10/09/2008  . Stricture and stenosis of esophagus 02/02/2008  . GERD 02/01/2008  . HIATAL HERNIA 02/01/2008  . ERECTILE DYSFUNCTION, ORGANIC 05/20/2010  . WRIST PAIN, LEFT 12/05/2009  . FOOT PAIN, LEFT 05/20/2010  . PERIPHERAL EDEMA 05/20/2010  . DYSPNEA 03/12/2010  . Abdominal pain, unspecified site 01/19/2009  . Abdominal pain, left lower quadrant 06/06/2010  . Type II or unspecified type diabetes mellitus without mention of complication, uncontrolled 11/14/2010   Past Surgical History  Procedure Laterality Date  . Carpal tunnel release    . Rotator cuff repair    . Esophageal dilation  july 2009  . Eye surgery      reports that he has never smoked. He has never used  smokeless tobacco. He reports that he drinks alcohol. He reports that he uses illicit drugs (Methaqualone). family history includes Depression in his mother; Diabetes in his brother and mother; Heart disease in his mother; Hyperlipidemia in his mother. There is no history of Colon cancer. Allergies  Allergen Reactions  . Codeine     REACTION: Itching  . Metformin And Related     GI upset, diarrhea   Current Outpatient Prescriptions on File Prior to Visit  Medication Sig Dispense Refill  . allopurinol (ZYLOPRIM) 100 MG tablet Take 2 tablets (200 mg total) by mouth daily. 30 tablet 6  . amLODipine (NORVASC) 10 MG tablet Take 1 tablet (10 mg total) by mouth daily. (Patient taking differently: Take 5 mg by mouth daily. ) 90 tablet 3  . aspirin (ASPIRIN EC) 81 MG EC tablet Take 81 mg by mouth daily.      Marland Kitchen atorvastatin (LIPITOR) 10 MG tablet TAKE 1 TABLET BY MOUTH EVERY DAY 90 tablet 3  . atorvastatin (LIPITOR) 10 MG tablet Take 10 mg by mouth daily.    . clonazePAM (KLONOPIN) 0.5 MG tablet TAKE 1 TABLET BY MOUTH TWICE DAILY AS NEEDED 60 tablet 1  . clonazePAM (KLONOPIN) 0.5 MG tablet Take 0.5 mg by mouth 2 (two) times daily as needed for anxiety.    . colchicine 0.6 MG tablet Take 1 tablet (0.6 mg total) by mouth 2 (two) times daily. 10 tablet 0  . DULoxetine (CYMBALTA) 20 MG capsule Take 20 mg by  mouth 3 (three) times daily.      . DULoxetine (CYMBALTA) 30 MG capsule Take 90 mg by mouth daily.     Marland Kitchen glipiZIDE (GLUCOTROL XL) 2.5 MG 24 hr tablet Take 1 tablet (2.5 mg total) by mouth daily with breakfast. 90 tablet 3  . glucose blood (FREESTYLE TEST STRIPS) test strip Use as instructed 100 each 12  . influenza vac recombinant HA trivalent (FLUBLOK) injection Inject 0.5 mLs into the muscle once.    . meloxicam (MOBIC) 15 MG tablet Take 1 tablet (15 mg total) by mouth daily. 30 tablet 0  . methylphenidate 54 MG PO CR tablet 1 tab by mouth twice per day - (Patient taking differently: Take 54 mg by  mouth 2 (two) times daily. ) 60 tablet 0  . NUCYNTA 100 MG TABS Take 100 mg by mouth 3 (three) times daily.     Marland Kitchen oxyCODONE-acetaminophen (PERCOCET) 7.5-325 MG per tablet Take 1 tablet by mouth every 6 (six) hours as needed for pain. 20 tablet 0  . pneumococcal 13-valent conjugate vaccine (PREVNAR 13) SUSP injection Inject 0.5 mLs into the muscle once.    . pregabalin (LYRICA) 100 MG capsule Take 100 mg by mouth 3 (three) times daily.      . pregabalin (LYRICA) 200 MG capsule Take 200 mg by mouth 2 (two) times daily.     No current facility-administered medications on file prior to visit.   Review of Systems Not done as pt not really able other than above, vomiting in office    Objective:   Physical Exam BP 162/92 mmHg  Pulse 117  Temp(Src) 98.6 F (37 C) (Oral)  SpO2 96%  Orthostatics:  Lying:  160/110, sitting 132/90, standing 100/80 VS noted, mod ill appearing, weak, markedly unsteady gait, high risk fall, has to be assisted to stand and walk few steps, is able to help somewhat to get up on exam table Constitutional: Pt appears well-developed, well-nourished.  HENT: Head: NCAT.  Right Ear: External ear normal.  Left Ear: External ear normal.  Eyes: . Pupils are equal, round, and reactive to light. Conjunctivae and EOM are normal Neck: Normal range of motion. Neck supple.  Cardiovascular: Normal rate and regular rhythm.   Pulmonary/Chest: Effort normal and breath sounds normal.  Abd:  Soft, NT, ND, + BS Spine with equivocal tender thoracic region, but no swelling,point tender I can tell or rash or erythema Neurological: Pt is alert. Not confused ,moves all 4's o/w not done in detail Skin: Skin is warm. No rash Psychiatric: Pt behavior is normal. No agitation.     Assessment & Plan:

## 2014-06-21 NOTE — Assessment & Plan Note (Addendum)
?   Related to GI illness, but also cont's to have LE paresthesias and difficult exam given his general weakness and dehydration,  Likely need t-spine MRI to r/o transverse myelitis or other  Note:  Total time for pt hx, exam, review of record with pt in the room, determination of diagnoses and plan for further eval and tx is > 40 min, with over 50% spent in coordination and counseling of patient

## 2014-06-22 ENCOUNTER — Inpatient Hospital Stay (HOSPITAL_COMMUNITY): Payer: BC Managed Care – PPO

## 2014-06-22 DIAGNOSIS — R112 Nausea with vomiting, unspecified: Secondary | ICD-10-CM

## 2014-06-22 LAB — COMPREHENSIVE METABOLIC PANEL
ALBUMIN: 4.3 g/dL (ref 3.5–5.2)
ALK PHOS: 68 U/L (ref 39–117)
ALT: 20 U/L (ref 0–53)
AST: 20 U/L (ref 0–37)
Anion gap: 15 (ref 5–15)
BILIRUBIN TOTAL: 1 mg/dL (ref 0.3–1.2)
BUN: 19 mg/dL (ref 6–23)
CHLORIDE: 101 meq/L (ref 96–112)
CO2: 24 mEq/L (ref 19–32)
Calcium: 9.2 mg/dL (ref 8.4–10.5)
Creatinine, Ser: 0.81 mg/dL (ref 0.50–1.35)
GFR calc Af Amer: >90 mL/min (ref >90)
GFR calc non Af Amer: >90 mL/min (ref >90)
Glucose, Bld: 97 mg/dL (ref 70–99)
Potassium: 2.9 mEq/L — CL (ref 3.7–5.3)
SODIUM: 140 meq/L (ref 137–147)
TOTAL PROTEIN: 8 g/dL (ref 6.0–8.3)

## 2014-06-22 LAB — CBC
HCT: 43 % (ref 39.0–52.0)
HEMOGLOBIN: 15.4 g/dL (ref 13.0–17.0)
MCH: 31.1 pg (ref 26.0–34.0)
MCHC: 35.8 g/dL (ref 30.0–36.0)
MCV: 86.9 fL (ref 78.0–100.0)
Platelets: 203 10*3/uL (ref 150–400)
RBC: 4.95 MIL/uL (ref 4.22–5.81)
RDW: 12.8 % (ref 11.5–15.5)
WBC: 8.6 10*3/uL (ref 4.0–10.5)

## 2014-06-22 LAB — HEMOGLOBIN A1C
Hgb A1c MFr Bld: 5.9 % — ABNORMAL HIGH (ref <5.7)
Mean Plasma Glucose: 123 mg/dL — ABNORMAL HIGH (ref <117)

## 2014-06-22 LAB — GLUCOSE, CAPILLARY
GLUCOSE-CAPILLARY: 104 mg/dL — AB (ref 70–99)
GLUCOSE-CAPILLARY: 107 mg/dL — AB (ref 70–99)
GLUCOSE-CAPILLARY: 119 mg/dL — AB (ref 70–99)
Glucose-Capillary: 90 mg/dL (ref 70–99)
Glucose-Capillary: 95 mg/dL (ref 70–99)
Glucose-Capillary: 118 mg/dL — ABNORMAL HIGH (ref 70–99)

## 2014-06-22 LAB — MAGNESIUM: MAGNESIUM: 2.2 mg/dL (ref 1.5–2.5)

## 2014-06-22 LAB — PHOSPHORUS: PHOSPHORUS: 2.5 mg/dL (ref 2.3–4.6)

## 2014-06-22 LAB — TSH: TSH: 5.18 u[IU]/mL — ABNORMAL HIGH (ref 0.350–4.500)

## 2014-06-22 MED ORDER — PROMETHAZINE HCL 25 MG/ML IJ SOLN
12.5000 mg | INTRAMUSCULAR | Status: DC | PRN
  Administered 2014-06-22 – 2014-06-26 (×3): 12.5 mg via INTRAVENOUS
  Filled 2014-06-22 (×4): qty 1

## 2014-06-22 MED ORDER — POTASSIUM CHLORIDE 10 MEQ/100ML IV SOLN
10.0000 meq | INTRAVENOUS | Status: AC
  Administered 2014-06-22 (×6): 10 meq via INTRAVENOUS
  Filled 2014-06-22 (×8): qty 100

## 2014-06-22 MED ORDER — METOCLOPRAMIDE HCL 5 MG/ML IJ SOLN: 5.0000 mg | Freq: Four times a day (QID) | INTRAMUSCULAR | Status: DC | PRN

## 2014-06-22 MED ORDER — HYDRALAZINE HCL 20 MG/ML IJ SOLN
5.0000 mg | Freq: Four times a day (QID) | INTRAMUSCULAR | Status: DC
  Administered 2014-06-22 – 2014-06-24 (×7): 5 mg via INTRAVENOUS
  Filled 2014-06-22 (×2): qty 0.25
  Filled 2014-06-22: qty 1
  Filled 2014-06-22 (×5): qty 0.25
  Filled 2014-06-22 (×2): qty 1
  Filled 2014-06-22: qty 0.25
  Filled 2014-06-22: qty 1

## 2014-06-22 MED ORDER — MORPHINE SULFATE 2 MG/ML IJ SOLN
2.0000 mg | INTRAMUSCULAR | Status: DC | PRN
  Administered 2014-06-22 (×2): 2 mg via INTRAVENOUS
  Filled 2014-06-22 (×2): qty 1

## 2014-06-22 MED ORDER — PANTOPRAZOLE SODIUM 40 MG IV SOLR
40.0000 mg | Freq: Two times a day (BID) | INTRAVENOUS | Status: DC
  Administered 2014-06-22 – 2014-06-28 (×13): 40 mg via INTRAVENOUS
  Filled 2014-06-22 (×14): qty 40

## 2014-06-22 MED ORDER — LORAZEPAM 2 MG/ML IJ SOLN
0.5000 mg | Freq: Four times a day (QID) | INTRAMUSCULAR | Status: DC | PRN
  Administered 2014-06-22 – 2014-06-27 (×7): 0.5 mg via INTRAVENOUS
  Filled 2014-06-22 (×7): qty 1

## 2014-06-22 MED ORDER — HYDRALAZINE HCL 10 MG PO TABS
10.0000 mg | ORAL_TABLET | Freq: Three times a day (TID) | ORAL | Status: DC
  Administered 2014-06-22 (×2): 10 mg via ORAL
  Filled 2014-06-22 (×4): qty 1

## 2014-06-22 MED ORDER — ONDANSETRON HCL 4 MG PO TABS
4.0000 mg | ORAL_TABLET | Freq: Two times a day (BID) | ORAL | Status: DC
  Administered 2014-06-22 – 2014-06-28 (×11): 4 mg via ORAL
  Filled 2014-06-22 (×15): qty 1

## 2014-06-22 MED ORDER — HYDROMORPHONE HCL 1 MG/ML IJ SOLN
1.0000 mg | INTRAMUSCULAR | Status: DC | PRN
  Administered 2014-06-22 – 2014-06-28 (×28): 1 mg via INTRAVENOUS
  Filled 2014-06-22 (×28): qty 1

## 2014-06-22 MED ORDER — MORPHINE SULFATE 2 MG/ML IJ SOLN
1.0000 mg | Freq: Once | INTRAMUSCULAR | Status: AC
Start: 2014-06-22 — End: 2014-06-22
  Administered 2014-06-22: 1 mg via INTRAVENOUS
  Filled 2014-06-22: qty 1

## 2014-06-22 MED ORDER — POTASSIUM CHLORIDE CRYS ER 20 MEQ PO TBCR
40.0000 meq | EXTENDED_RELEASE_TABLET | Freq: Once | ORAL | Status: DC
  Filled 2014-06-22: qty 2

## 2014-06-22 MED ORDER — METOCLOPRAMIDE HCL 5 MG/ML IJ SOLN
5.0000 mg | Freq: Four times a day (QID) | INTRAMUSCULAR | Status: DC
  Administered 2014-06-22 – 2014-06-28 (×24): 5 mg via INTRAVENOUS
  Filled 2014-06-22 (×2): qty 1
  Filled 2014-06-22: qty 2
  Filled 2014-06-22 (×5): qty 1
  Filled 2014-06-22: qty 2
  Filled 2014-06-22 (×2): qty 1
  Filled 2014-06-22 (×2): qty 2
  Filled 2014-06-22 (×4): qty 1
  Filled 2014-06-22: qty 2
  Filled 2014-06-22 (×10): qty 1
  Filled 2014-06-22: qty 2
  Filled 2014-06-22: qty 1

## 2014-06-22 MED ORDER — METOCLOPRAMIDE HCL 5 MG/ML IJ SOLN
10.0000 mg | Freq: Four times a day (QID) | INTRAMUSCULAR | Status: DC
  Administered 2014-06-22: 10 mg via INTRAVENOUS
  Filled 2014-06-22: qty 2

## 2014-06-22 MED ORDER — HYDRALAZINE HCL 20 MG/ML IJ SOLN
5.0000 mg | INTRAMUSCULAR | Status: DC | PRN
  Administered 2014-06-22 – 2014-06-27 (×5): 5 mg via INTRAVENOUS
  Filled 2014-06-22 (×3): qty 1

## 2014-06-22 MED ORDER — SODIUM CHLORIDE 0.9 % IV SOLN
INTRAVENOUS | Status: DC
  Administered 2014-06-22 – 2014-06-27 (×6): via INTRAVENOUS

## 2014-06-22 MED ORDER — PROMETHAZINE HCL 25 MG/ML IJ SOLN
12.5000 mg | Freq: Once | INTRAMUSCULAR | Status: AC
  Administered 2014-06-22: 12.5 mg via INTRAVENOUS
  Filled 2014-06-22: qty 1

## 2014-06-22 NOTE — Plan of Care (Signed)
Problem: Consults Goal: General Medical Patient Education See Patient Education Module for specific education. Outcome: Completed/Met Date Met:  06/22/14

## 2014-06-22 NOTE — Progress Notes (Signed)
Patient ID: Frank [name], male   DOB: [birth year], 58 y.o.   MRN: [id number]  [facility] PROGRESS NOTE  Frank [name] JIR:[id number] DOB: January 27, 1956 DOA: 06/21/2014 PCP: Cathlean Cower, MD  Brief narrative: 58 y.o. Male with history of ankle fracture and follows with sports medicine, chronic back pain (on multiple analgesics including Percocet, Nucynta, lyrica, mobic), frequent falls at home, presented to [facility] ED with main concern of progressive weakness, nausea, non bloody vomiting, poor oral intake. He was sent from PCP office to the ED for further evaluation. In addition, pt reported mid area back pain, constant and throbbing, 10/10 in severity, radiating to the sides and to the front epigastric area, with no specific alleviating factors. This has been associated with poor mobility, inability to bear weight, occasional LE numbness and tingling.   Assessment and Plan:    Active Problems:   Nausea, vomiting, poor oral intake - no clear etiology at this time  - possibly related to multiple narcotic medications, ? Gastroparesis - placed on Reglan 5 mg IV Q6 hours - also advance diet to clears and monitor clinical response - minimize narcotic use as possible  - no aortic dissection or aneurysm noted on recent Ct angio abd 11/07 - appreciate GI input    Diabetes mellitus, with complications of neuropathies  - keep on SSI for now until oral intake improves - readjust the regimen as indicated    Hypokalemia - from vomiting, will supplement and will repeat BMP in AM   Accelerated HTN - pt refusing PO hydralazine  - will place on Hydralazine IV scheduled and as needed for now - transition to oral regimen when able to take PO    Chronic back pain  - MRI lumbar spine with mild lumbar spine degenerative changes, no focal disc herniation, no definite nerve root encroachment - pain that pt describes this AM is mostly thoracic - will plan on MRI thoracic spine in AM   Generalized weakness -  appears to be secondary to progressive deconditioning - PT/OT evaluation requested   DVT prophylaxis  Lovenox SQ while pt is in hospital  Code Status: DNR Family Communication: Partner at bedside Disposition Plan: Home when medically stable   IV Access:   Peripheral IV Procedures and diagnostic studies:   Dg Abd 1 View  06/21/2014  Moderate stool burden.  No acute findings.    Medical Consultants:   GI Other Consultants:   Physical therapy  Anti-Infectives:   None  Faye Ramsay, MD  [facility] Pager [phone]  If 7PM-7AM, please contact night-coverage [email or web address] Password [email or web address] 06/22/2014, 3:31 PM   LOS: 1 day   HPI/Subjective: No events overnight.   Objective: Filed Vitals:   06/22/14 0640 06/22/14 0641 06/22/14 1222 06/22/14 1356  BP: 190/90 190/90 177/86 191/110  Pulse:  100 91 94  Temp:  97.8 F (36.6 C) 97.8 F (36.6 C) 97.7 F (36.5 C)  TempSrc:  Oral Axillary Axillary  Resp:  18 20 20   Height:      Weight:      SpO2:  96% 97% 100%    Intake/Output Summary (Last 24 hours) at 06/22/14 1531 Last data filed at 06/22/14 1418  Gross per 24 hour  Intake      0 ml  Output   1345 ml  Net  -1345 ml    Exam:   General:  Pt is alert, in mild distress due to pain   Cardiovascular: Regular rate and rhythm, S1/S2, no murmurs, no rubs, no gallops  Respiratory: Clear to auscultation bilaterally, no wheezing, no crackles, no rhonchi  Abdomen: Soft, non tender, non distended, bowel sounds present, no guarding  Data Reviewed: Basic Metabolic Panel:  Recent Labs Lab 06/17/14 1936 06/20/14 1020 06/21/14 1826 06/22/14 0450  NA 142 139 140 140  K 4.4 3.9 4.3 2.9*  CL 102 100 99 101  CO2 25 24 23 24   GLUCOSE 128* 131* 106* 97  BUN 17 18 28* 19  CREATININE 1.02 0.86 0.93 0.81  CALCIUM 10.7* 9.6 10.2 9.2  MG  --   --   --  2.2  PHOS  --   --   --  2.5   Liver Function Tests:  Recent Labs Lab 06/17/14 1936 06/21/14 1826 06/22/14 0450  AST  17 34 20  ALT 19 22 20   ALKPHOS 74 69 68  BILITOT 0.8 1.0 1.0  PROT 8.0 8.9* 8.0  ALBUMIN 4.3 4.7 4.3    Recent Labs Lab 06/17/14 1936  LIPASE 17   CBC:  Recent Labs Lab 06/17/14 1936 06/20/14 1020 06/21/14 1826 06/22/14 0450  WBC 8.8 8.3 10.8* 8.6  NEUTROABS 5.4 5.6 7.5  --   HGB 15.9 15.1 17.1* 15.4  HCT 44.0 41.9 47.1 43.0  MCV 87.3 86.2 87.4 86.9  PLT 238 236 233 203   CBG:  Recent Labs Lab 06/21/14 2046 06/22/14 0008 06/22/14 0357 06/22/14 0740 06/22/14 1204  GLUCAP 102* 107* 90 95 104*   Scheduled Meds: . allopurinol  200 mg Oral Daily  . docusate sodium  100 mg Oral BID  . DULoxetine  90 mg Oral Daily  . enoxaparin (LOVENOX) injection  40 mg Subcutaneous Q24H  . insulin aspart  0-9 Units Subcutaneous 6 times per day  . methylphenidate  54 mg Oral Daily  . metoCLOPramide (REGLAN) injection  5 mg Intravenous 4 times per day  . ondansetron  4 mg Oral Q12H  . pantoprazole (PROTONIX) IV  40 mg Intravenous Q12H  . potassium chloride  10 mEq Intravenous Q1 Hr x 6  . pregabalin  200 mg Oral BID   Continuous Infusions: . sodium chloride 100 mL/hr at 06/22/14 1459

## 2014-06-22 NOTE — Progress Notes (Signed)
   06/22/14 0640  What Happened  Was fall witnessed? No  Was patient injured? No  Patient found on floor  Found by Staff-comment (Hermania NT)  Stated prior activity other (comment) (pt states he was trying to sit up)  Follow Up  MD notified Baltazar Najjar  Time MD notified [phone]  Family notified Yes-comment (significant other, mike, left voicemail)  Additional tests No  Simple treatment (no injury)  Adult Fall Risk Assessment  Risk Factor Category (scoring not indicated) High fall risk per protocol (document High fall risk)  Age 58  Fall History: Fall within 6 months prior to admission 5  Elimination; Bowel and/or Urine Incontinence 2  Elimination; Bowel and/or Urine Urgency/Frequency 0  Medications: includes PCA/Opiates, Anti-convulsants, Anti-hypertensives, Diuretics, Hypnotics, Laxatives, Sedatives, and Psychotropics 5  Patient Care Equipment 1  Mobility-Assistance 2  Mobility-Gait 2  Mobility-Sensory Deficit 0  Cognition-Awareness 0  Cognition-Impulsiveness 2  Cognition-Limitations 0  Total Score 19  Patient's Fall Risk High Fall Risk (>13 points)  Adult Fall Risk Interventions  Required Bundle Interventions *See Row Information* High fall risk - low, moderate, and high requirements implemented  Additional Interventions Use of appropriate toileting equipment (bedpan, BSC, etc.);Secure all tubes/drains  Vitals  BP (!) 190/90 mmHg

## 2014-06-22 NOTE — Progress Notes (Signed)
CRITICAL VALUE ALERT  Critical value received:  K+2.9  Date of notification:  11/12  Time of notification:  0608  Critical value read back:Yes.    Nurse who received alert:  Karri Kallenbach rn  MD notified (1st page):  kirby  Time of first page:    MD notified (2nd page):  Time of second page:  Responding MD:   Time MD responded:

## 2014-06-22 NOTE — Consult Note (Signed)
Referring Provider:  Dr. Doyle Askew Primary Care Physician:  Cathlean Cower, MD Primary Gastroenterologist:  Dr. Deatra Ina  Reason for Consultation:  Intractable nausea and vomiting  HPI: Frank [name] is a 58 y.o. male who was sent to the ED at [facility] hospital on 11/11 by PCP for evaluation of weakness, back pain, and ongoing nausea and vomiting.  Patient had a fall in end of July and had sustained ankle fracture, followed by sports medicine. Patient have had diminished ambulation ever since stating due to pain. His been taking prescribed opioids for pain management (takes percocet, Nucynta, lyrica, cymbalta, mobic, all of which can treat pain).  His partner, who provides most of the history for the patient says that he is not the most compliant in following directions for taking some of his medications such as Nucynta.  Patient has been complaining of thoracic back pain, his partner calls is "trunk pain". He has been having worsening nausea and vomiting for past few weeks. Reports not able to keep anything down in the form of liquids even since Sunday.  Last BM was 6 days ago.  Patient has reportedly been somewhat more lethargic. Reports generalized pain and inability to find a comfortable position; writhing around the bed during the whole interview.  Patient reports some tingling in upper extremities bilaterally as well as diminished sensation in the lower extremities that seems to be more of a chronic complaint due to history of diabetes induced neuropathy.  Has had falls at home recently and one while here at the hospital as well.  Patient endorses low extremity weakness but moving all extremities spontaneously.  When asked where his pain is he says "all over".  Doesn't report any significant abdominal pain, but points to lower abdomen and says that he has some discomfort there.  Apparently PCP wanted testing/treatment for gastroparesis.  Reglan 5 mg IV was ordered prn so has not been given.  His partner says  that he takes prilosec at home but it is not on his medication list.  He is not on PPI here currently.    Abdominal x-ray showed moderate stool burden but was otherwise unremarkable.  EGD 02/2008 showed esophageal stricture that was dilated with [facility] dilator.  Colonoscopy by Dr. Deatra Ina 08/2013 showed two polyps that were removed and were tubular adenomas; repeat colonoscopy recommended in 5 years.  Also had internal hemorrhoids.   Past Medical History  Diagnosis Date  . COLONIC POLYPS 02/01/2008  . DIABETES MELLITUS, TYPE II 05/20/2010  . HYPERLIPIDEMIA 10/09/2008  . ANXIETY DEPRESSION 02/01/2008  . ERECTILE DYSFUNCTION 10/09/2008  . ADD 10/09/2008  . SLEEP APNEA, OBSTRUCTIVE 02/01/2008  . MORTON'S NEUROMA 05/20/2010  . PERIPHERAL NEUROPATHY 05/20/2010  . Other specified forms of hearing loss 06/27/2009  . HYPERTENSION 10/09/2008  . HEMORRHOIDS 02/01/2008  . ALLERGIC RHINITIS 10/09/2008  . Stricture and stenosis of esophagus 02/02/2008  . GERD 02/01/2008  . HIATAL HERNIA 02/01/2008  . ERECTILE DYSFUNCTION, ORGANIC 05/20/2010  . WRIST PAIN, LEFT 12/05/2009  . FOOT PAIN, LEFT 05/20/2010  . PERIPHERAL EDEMA 05/20/2010  . DYSPNEA 03/12/2010  . Abdominal pain, unspecified site 01/19/2009  . Abdominal pain, left lower quadrant 06/06/2010  . Type II or unspecified type diabetes mellitus without mention of complication, uncontrolled 11/14/2010    Past Surgical History  Procedure Laterality Date  . Carpal tunnel release    . Rotator cuff repair    . Esophageal dilation  july 2009  . Eye surgery      Prior to Admission  medications   Medication Sig Start Date End Date Taking? Authorizing Provider  allopurinol (ZYLOPRIM) 100 MG tablet Take 2 tablets (200 mg total) by mouth daily. 03/27/14   Lyndal Pulley, DO  amLODipine (NORVASC) 10 MG tablet Take 1 tablet (10 mg total) by mouth daily. 03/27/14   Lyndal Pulley, DO  aspirin (ASPIRIN EC) 81 MG EC tablet Take 81 mg by mouth daily.      Historical  Provider, MD  atorvastatin (LIPITOR) 10 MG tablet TAKE 1 TABLET BY MOUTH EVERY DAY 07/26/13   Biagio Borg, MD  atorvastatin (LIPITOR) 10 MG tablet Take 10 mg by mouth daily.    Historical Provider, MD  clonazePAM (KLONOPIN) 0.5 MG tablet TAKE 1 TABLET BY MOUTH TWICE DAILY AS NEEDED 04/05/14   Biagio Borg, MD  clonazePAM (KLONOPIN) 0.5 MG tablet Take 0.5 mg by mouth 2 (two) times daily as needed for anxiety.    Historical Provider, MD  colchicine 0.6 MG tablet Take 1 tablet (0.6 mg total) by mouth 2 (two) times daily. 03/27/14   Lyndal Pulley, DO  DULoxetine (CYMBALTA) 20 MG capsule Take 20 mg by mouth 3 (three) times daily.      Historical Provider, MD  DULoxetine (CYMBALTA) 30 MG capsule Take 90 mg by mouth daily.     Historical Provider, MD  glipiZIDE (GLUCOTROL XL) 2.5 MG 24 hr tablet Take 1 tablet (2.5 mg total) by mouth daily with breakfast. 05/17/14   Biagio Borg, MD  glucose blood (FREESTYLE TEST STRIPS) test strip Use as instructed 06/04/12   Biagio Borg, MD  influenza vac recombinant HA trivalent (FLUBLOK) injection Inject 0.5 mLs into the muscle once.    Historical Provider, MD  meloxicam (MOBIC) 15 MG tablet Take 1 tablet (15 mg total) by mouth daily. 03/06/14   Zenaida Niece, PA-C  methylphenidate 54 MG PO CR tablet 1 tab by mouth twice per day - 04/05/14   Biagio Borg, MD  NUCYNTA 100 MG TABS Take 100 mg by mouth 3 (three) times daily.  05/12/13   Historical Provider, MD  oxyCODONE-acetaminophen (PERCOCET) 7.5-325 MG per tablet Take 1 tablet by mouth every 6 (six) hours as needed for pain. 06/20/14   Virgel Manifold, MD  pneumococcal 13-valent conjugate vaccine (PREVNAR 13) SUSP injection Inject 0.5 mLs into the muscle once.    Historical Provider, MD  pregabalin (LYRICA) 100 MG capsule Take 100 mg by mouth 3 (three) times daily.      Historical Provider, MD  pregabalin (LYRICA) 200 MG capsule Take 200 mg by mouth 2 (two) times daily.    Historical Provider, MD    Current  Facility-Administered Medications  Medication Dose Route Frequency Provider Last Rate Last Dose  . acetaminophen (TYLENOL) tablet 650 mg  650 mg Oral Q6H PRN Toy Baker, MD       Or  . acetaminophen (TYLENOL) suppository 650 mg  650 mg Rectal Q6H PRN Toy Baker, MD      . allopurinol (ZYLOPRIM) tablet 200 mg  200 mg Oral Daily Toy Baker, MD      . atorvastatin (LIPITOR) tablet 10 mg  10 mg Oral Daily Toy Baker, MD   10 mg at 06/21/14 2248  . clonazePAM (KLONOPIN) tablet 0.5 mg  0.5 mg Oral BID PRN Toy Baker, MD   0.5 mg at 06/21/14 2104  . docusate sodium (COLACE) capsule 100 mg  100 mg Oral BID Toy Baker, MD   100 mg at 06/21/14 2248  .  DULoxetine (CYMBALTA) DR capsule 90 mg  90 mg Oral Daily Toy Baker, MD      . enoxaparin (LOVENOX) injection 40 mg  40 mg Subcutaneous Q24H Toy Baker, MD   40 mg at 06/21/14 2247  . hydrALAZINE (APRESOLINE) tablet 10 mg  10 mg Oral 3 times per day Theodis Blaze, MD      . insulin aspart (novoLOG) injection 0-9 Units  0-9 Units Subcutaneous 6 times per day Toy Baker, MD   0 Units at 06/22/14 0000  . methylphenidate (CONCERTA) CR tablet 54 mg  54 mg Oral Daily Toy Baker, MD      . metoCLOPramide (REGLAN) injection 5 mg  5 mg Intravenous Q6H PRN Toy Baker, MD      . morphine 2 MG/ML injection 2 mg  2 mg Intravenous Q4H PRN Gardiner Barefoot, NP   2 mg at 06/22/14 0825  . ondansetron (ZOFRAN) tablet 4 mg  4 mg Oral Q6H PRN Toy Baker, MD       Or  . ondansetron (ZOFRAN) injection 4 mg  4 mg Intravenous Q6H PRN Toy Baker, MD      . oxyCODONE-acetaminophen (PERCOCET/ROXICET) 5-325 MG per tablet 1 tablet  1 tablet Oral Q6H PRN Toy Baker, MD   1 tablet at 06/21/14 2103   And  . oxyCODONE (Oxy IR/ROXICODONE) immediate release tablet 2.5 mg  2.5 mg Oral Q6H PRN Toy Baker, MD   2.5 mg at 06/21/14 2103  . potassium chloride 10 mEq in 100 mL  IVPB  10 mEq Intravenous Q1 Hr x 6 Theodis Blaze, MD      . pregabalin (LYRICA) capsule 200 mg  200 mg Oral BID Toy Baker, MD        Allergies as of 06/21/2014 - Review Complete 06/21/2014  Allergen Reaction Noted  . Codeine    . Metformin and related  05/17/2014    Family History  Problem Relation Age of Onset  . Diabetes Mother   . Heart disease Mother   . Hyperlipidemia Mother   . Depression Mother   . Diabetes Brother   . Colon cancer Neg Hx     History   Social History  . Marital Status: Single    Spouse Name: N/A    Number of Children: N/A  . 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 Smoking status: Never Smoker   . Smokeless tobacco: Never Used  . Alcohol Use: Yes     Comment: occasional  . Drug Use: Yes    Special: Methaqualone  . Sexual Activity: Not on file   Other Topics Concern  . Not on file   Social History Narrative    Review of Systems: Ten point ROS is O/W negative except as mentioned in HPI.  Physical Exam: Vital signs in last 24 hours: Temp:  [97.5 F (36.4 C)-98.8 F (37.1 C)] 97.8 F (36.6 C) (11/12 0641) Pulse Rate:  [63-117] 100 (11/12 0641) Resp:  [14-18] 18 (11/12 0641) BP: (100-197)/(54-110) 190/90 mmHg (11/12 0641) SpO2:  [96 %-100 %] 96 % (11/12 0641) Weight:  [195 lb (88.451 kg)] 195 lb (88.451 kg) (11/11 2038) Last BM Date: 06/16/14 General:  Alert, Well-developed, well-nourished, writhing around bed throughout entire visit. Head:  Normocephalic and atraumatic. Eyes:  Sclera clear, no icterus.  Conjunctiva pink. Ears:  Normal auditory acuity. Mouth:  No deformity or lesions.   Lungs:  Clear throughout to  auscultation.  No wheezes, crackles, or rhonchi.  Heart:  Regular rate and rhythm; no murmurs, clicks, rubs, or gallops. Abdomen:  Soft, non-distended.  BS present.  Minimal lower abdominal TTP.   Rectal:  Deferred  Msk:  Symmetrical without gross  deformities. Pulses:  Normal pulses noted. Extremities:  Without clubbing or edema. Skin:  Intact without significant lesions or rashes.  Lab Results:  Recent Labs  06/20/14 1020 06/21/14 1826 06/22/14 0450  WBC 8.3 10.8* 8.6  HGB 15.1 17.1* 15.4  HCT 41.9 47.1 43.0  PLT 236 233 203   BMET  Recent Labs  06/20/14 1020 06/21/14 1826 06/22/14 0450  NA 139 140 140  K 3.9 4.3 2.9*  CL 100 99 101  CO2 24 23 24   GLUCOSE 131* 106* 97  BUN 18 28* 19  CREATININE 0.86 0.93 0.81  CALCIUM 9.6 10.2 9.2   LFT  Recent Labs  06/22/14 0450  PROT 8.0  ALBUMIN 4.3  AST 20  ALT 20  ALKPHOS 68  BILITOT 1.0   Studies/Results: Dg Abd 1 View  06/21/2014   CLINICAL DATA:  Constipation.  Generalized abdominal pain.  EXAM: ABDOMEN - 1 VIEW  COMPARISON:  06/17/2014  FINDINGS: Moderate stool burden throughout the colon. Nonobstructive bowel gas pattern. No free air. No organomegaly or suspicious calcification. Visualized lung bases are clear.  IMPRESSION: Moderate stool burden.  No acute findings.   Electronically Signed   By: Rolm Baptise M.D.   On: 06/21/2014 21:11   Mr Lumbar Spine Wo Contrast  06/20/2014   CLINICAL DATA:  Low back pain radiating into both legs, worse on the right. Evaluate for disc herniation. Initial encounter  EXAM: MRI LUMBAR SPINE WITHOUT CONTRAST  TECHNIQUE: Multiplanar, multisequence MR imaging of the lumbar spine was performed. No intravenous contrast was administered.  COMPARISON:  CTs of the chest, abdomen and pelvis 06/17/2014.  FINDINGS: There is transitional lumbosacral anatomy. As correlated with prior CT, there are small ribs at T12 and a partially sacralized L5 segment. The alignment is normal. There is no evidence of fracture or pars defect. There are scattered type 2 endplate degenerative changes, greatest at L1-2 and L3-4.  The conus medullaris extends to the T12-L1 level and appears normal. No paraspinal abnormalities are identified.  There are small  anterior osteophytes within the lower thoracic spine, but no disc herniation, spinal stenosis or nerve root encroachment.  L1-2: Mild disc bulging with anterior osteophytes and mild facet hypertrophy. No spinal stenosis or nerve root encroachment.  L2-3:  Normal interspace.  L3-4: There is mild loss of disc height with annular bulging and endplate degeneration. There is mild facet and ligamentous hypertrophy. These factors contribute to mild lateral recess and foraminal stenosis bilaterally. There is no focal disc herniation or definite nerve root encroachment.  L4-5: Disc height is relatively maintained. There is mild disc bulging asymmetric to the right and mild bilateral facet hypertrophy. There is minimal narrowing of the right foramen without definite nerve root encroachment.  L5-S1: Axial images were not obtained through this vestigial disc space. The disc space appears fused. There is no spinal stenosis or nerve root encroachment.  IMPRESSION: 1. Transitional lumbosacral anatomy.  L5 is largely sacralized. 2. Mild lumbar spine degenerative changes, greatest at L3-4 where there is annular disc bulging, facet and ligamentous hypertrophy contributing to mild lateral recess and foraminal stenosis bilaterally. 3. No focal disc herniation or definite nerve root encroachment.   Electronically Signed   By: Modesta Messing.D.  On: 06/20/2014 10:22    IMPRESSION: -Intractable nausea and vomiting:  Unable to tolerate PO for several days.  Abdominal exam unimpressive.  PCP requested testing/treatment for gastroparesis, which he may have due to his diabetes and narcotic use.  GES would not be helpful as we would expect it to be abnormal in the setting of narcotic use.  Also, he would physically not be able to perform the study at this time.  ? Neurologic source that is causing these symptoms along with the pain and weakness?   -Hypokalemia:  Correction per primary service. -Back pain/leg weakness:  Per primary  service.  PLAN: -Will treat empirically for gastroparesis at this time with Reglan 10 mg IV every 6 hours.  Protonix 40 mg IV BID.  Phenergan prn. -NPO except for sips with PO meds if tolerated. -Need to limit narcotics. -Any further imaging/studies/testing per Dr. Ardis Hughs. -May need enema to help move his bowels.  ZEHR, JESSICA D.  06/22/2014, 9:15 AM  Pager number [phone]   ________________________________________________________________________  Velora Heckler GI MD note:  I personally examined the patient, reviewed the data and agree with the assessment and plan described above.  Difficult historian, mumbles most of his answers.  I think it is very likely that his chronic narcotic usage is causing much of his GI distress (consitation, nausea).  Should really try to limit narcotics as much as possible here.  I changed his zofran to scheduled PO twice daily dosing. Also decreased relgan to 5mg  IV every 6 hours. It is safe that he have clears.  Needs to be encouraged to ambulate as much as possible.  NO plan for invasive GI testing for now.  Agree that Gastric emptying scan should not be done now.   Owens Loffler, MD [facility] Pager [phone]

## 2014-06-23 DIAGNOSIS — R531 Weakness: Secondary | ICD-10-CM

## 2014-06-23 LAB — CBC
HCT: 46.1 % (ref 39.0–52.0)
Hemoglobin: 16.6 g/dL (ref 13.0–17.0)
MCH: 31.4 pg (ref 26.0–34.0)
MCHC: 36 g/dL (ref 30.0–36.0)
MCV: 87.1 fL (ref 78.0–100.0)
PLATELETS: 217 10*3/uL (ref 150–400)
RBC: 5.29 MIL/uL (ref 4.22–5.81)
RDW: 12.9 % (ref 11.5–15.5)
WBC: 10.3 10*3/uL (ref 4.0–10.5)

## 2014-06-23 LAB — GLUCOSE, CAPILLARY
GLUCOSE-CAPILLARY: 100 mg/dL — AB (ref 70–99)
GLUCOSE-CAPILLARY: 130 mg/dL — AB (ref 70–99)
GLUCOSE-CAPILLARY: 88 mg/dL (ref 70–99)
Glucose-Capillary: 106 mg/dL — ABNORMAL HIGH (ref 70–99)
Glucose-Capillary: 121 mg/dL — ABNORMAL HIGH (ref 70–99)

## 2014-06-23 LAB — BASIC METABOLIC PANEL
ANION GAP: 18 — AB (ref 5–15)
BUN: 14 mg/dL (ref 6–23)
CHLORIDE: 99 meq/L (ref 96–112)
CO2: 20 meq/L (ref 19–32)
Calcium: 9.8 mg/dL (ref 8.4–10.5)
Creatinine, Ser: 0.73 mg/dL (ref 0.50–1.35)
GFR calc non Af Amer: >90 mL/min (ref >90)
Glucose, Bld: 125 mg/dL — ABNORMAL HIGH (ref 70–99)
Potassium: 3.7 mEq/L (ref 3.7–5.3)
SODIUM: 137 meq/L (ref 137–147)

## 2014-06-23 MED ORDER — PHENOL 1.4 % MT LIQD
1.0000 | OROMUCOSAL | Status: DC | PRN
  Filled 2014-06-23: qty 177

## 2014-06-23 MED ORDER — BOOST / RESOURCE BREEZE PO LIQD
1.0000 | Freq: Two times a day (BID) | ORAL | Status: DC
  Administered 2014-06-23 – 2014-06-25 (×3): 1 via ORAL

## 2014-06-23 MED ORDER — POLYETHYLENE GLYCOL 3350 17 G PO PACK
17.0000 g | PACK | Freq: Two times a day (BID) | ORAL | Status: DC
  Administered 2014-06-23 – 2014-06-28 (×6): 17 g via ORAL
  Filled 2014-06-23 (×11): qty 1

## 2014-06-23 MED ORDER — SENNOSIDES-DOCUSATE SODIUM 8.6-50 MG PO TABS
1.0000 | ORAL_TABLET | Freq: Two times a day (BID) | ORAL | Status: DC
  Administered 2014-06-23 – 2014-06-24 (×3): 1 via ORAL
  Filled 2014-06-23 (×7): qty 1

## 2014-06-23 MED ORDER — MENTHOL 3 MG MT LOZG
1.0000 | LOZENGE | OROMUCOSAL | Status: DC | PRN
  Filled 2014-06-23: qty 9

## 2014-06-23 NOTE — Evaluation (Signed)
Physical Therapy Evaluation Patient Details Name: Frank [name] MRN: [id number] DOB: 1956-05-17 Today's Date: 06/23/2014   History of Present Illness  58 yo Male adm 06/21/14 through [facility] ED with progressive weakness, nausea,  vomiting, poor oral intake and  history of L lateral malleolus fracturea and limited mobility since  (followed by Dr. Charlann Boxer at [facility])  PMHx: chronic back pain, frequent falls at home. This has been associated with poor mobility, inability to bear weight, occasional LE numbness and tingling.    Clinical Impression   Pt will benefit from PT to address deficits below; Pt will need SNF level care as he is alone during day and is unable to care for himself at this time;  Pt is with limited participation at time of eval agreeing only to sit EOB; Will continue to follow in acute venue  Follow Up Recommendations SNF    Equipment Recommendations  None recommended by PT    Recommendations for Other Services       Precautions / Restrictions Precautions Precautions: Back Precaution Comments: chronic back pain Restrictions Weight Bearing Restrictions: No      Mobility  Bed Mobility Overal bed mobility: Needs Assistance;+2 for physical assistance Bed Mobility: Rolling;Sidelying to Sit;Sit to Sidelying Rolling: Min guard Sidelying to sit: +2 for physical assistance;Max assist     Sit to sidelying: +2 for physical assistance;Max assist General bed mobility comments: multimodal cues for all aspects of rolling and s/l to sit technique; +2 to bring trunk forward and to control descent  and LEs off/on bed; lateral scooting along EOB with +2 mod/max assist for wt shfit and safety, multimodal cues for sequence/technique  Transfers                 General transfer comment: pt declined OOB  Ambulation/Gait                Stairs            Wheelchair Mobility    Modified Rankin (Stroke Patients Only)       Balance Overall balance  assessment: Needs assistance;History of Falls Sitting-balance support: Bilateral upper extremity supported;Feet supported Sitting balance-Leahy Scale: Poor Sitting balance - Comments: multi-directional LOB in sitting; pt was able to static sit ~1 min at a time without assist, close supervision for safety and bil UE support       Standing balance comment: not tested, pt declined                             Pertinent Vitals/Pain Pain Assessment: 0-10 Pain Score: 6  Pain Location: back  Pain Intervention(s): Limited activity within patient's tolerance;Monitored during session    [place] expects to be discharged to:: Unsure Living Arrangements: Spouse/significant other (partner)   Type of Home: House Home Access: Stairs to enter   [employer] of Steps: 2 Home Layout: One level Home Equipment: Cane - single point;Walker - standard Additional Comments: pt's partner works during day and is available in evenings to assist    Prior Function Level of Independence: Needs assistance         Comments: pt is not specific about his prior level of function, he states he was having "a lot of falls" and was spending much time "in bed and on the couch"     Hand Dominance        Extremity/Trunk Assessment   Upper Extremity Assessment: Defer to OT evaluation  Lower Extremity Assessment: Generalized weakness         Communication      Cognition Arousal/Alertness: Lethargic;Suspect due to medications Behavior During Therapy: Flat affect Overall Cognitive Status: No family/caregiver present to determine baseline cognitive functioning                      General Comments General comments (skin integrity, edema, etc.): very diminished core/trunk strength    Exercises        Assessment/Plan    PT Assessment Patient needs continued PT services  PT Diagnosis Generalized weakness   PT Problem List Decreased  strength;Decreased activity tolerance;Decreased mobility  PT Treatment Interventions DME instruction;Gait training;Functional mobility training;Therapeutic activities;Therapeutic exercise;Patient/family education;Balance training   PT Goals (Current goals can be found in the Care Plan section) Acute Rehab PT Goals Patient Stated Goal: to get stronger PT Goal Formulation: With patient Time For Goal Achievement: 07/07/14 Potential to Achieve Goals: Fair    Frequency Min 3X/week   Barriers to discharge Decreased caregiver support      Co-evaluation   Reason for Co-Treatment: Complexity of the patient's impairments (multi-system involvement);For patient/therapist safety PT goals addressed during session: Mobility/safety with mobility         End of Session   Activity Tolerance: Patient limited by fatigue;Other (comment);Patient limited by pain (n/v) Patient left: in bed;with call bell/phone within reach;with bed alarm set           Time: 6553-7482 PT Time Calculation (min) (ACUTE ONLY): 19 min   Charges:   PT Evaluation $Initial PT Evaluation Tier I: 1 Procedure     PT G CodesKenyon Ana 05-Jul-2014, 12:43 PM

## 2014-06-23 NOTE — Progress Notes (Signed)
INITIAL NUTRITION ASSESSMENT  DOCUMENTATION CODES Per approved criteria  -Not Applicable   INTERVENTION: Resource Breeze po BID, each supplement provides 250 kcal and 9 grams of protein Encourage PO intake as tolerated  NUTRITION DIAGNOSIS: Inadequate oral intake related to nausea and vomiting as evidenced by 0% meal completion and 2.5% weight loss in the past week.   Goal: Pt to meet >/= 90% of their estimated nutrition needs  Monitor:  Diet advancement, PO intake/tolerance, weight trend, labs  Reason for Assessment: Malnutrition Screening Tool, score of 3  58 y.o. male  Admitting Dx: Nausea and Vomiting with Dehydration  ASSESSMENT: 58 year old male with history of colonic polyps, diabetes mellitus Type 2, anxiety depression, ADD, sleep apnea, peripheral neuropathy, hypertension, GERD, and hiatal hernia. Patient  had a fall in end of July and have sustained ankle fracture followed by sports medicine. Patient have had diminished ambulation ever since stating due to pain. His been taking prescribed opioids for pain management. He has been having worsening nausea and vomiting for past few days. Reports not able to keep anything down. Hospitalist was called for admission for not able to walk and continuous nausea and vomiting with dehydration.   Patient resting at time of visit and kept eyes closed during assessment, only answering yes or no questions. Patient had some ginger ale at bedside. Patient reports ongoing nausea today but, he has been able to keep some clear liquids down. He reports eating well before N/V started and maintaining his weight at 200 lbs. Pt has lost 5 lbs, 2.5% of his body weight, in the past week. Per nursing notes pt ate 0% of breakfast this morning and 0% of lunch.  Pt agreeable to trying Resource Breeze supplements while on the clear liquid diet.  Labs reviewed.   Height: Ht Readings from Last 1 Encounters:  06/21/14 5\' 9"  (1.753 m)    Weight: Wt  Readings from Last 1 Encounters:  06/21/14 195 lb (88.451 kg)    Ideal Body Weight: 160 lbs  % Ideal Body Weight: 122%  Wt Readings from Last 10 Encounters:  06/21/14 195 lb (88.451 kg)  06/20/14 200 lb (90.719 kg)  05/19/14 200 lb (90.719 kg)  05/17/14 200 lb (90.719 kg)  04/18/14 199 lb (90.266 kg)  03/27/14 206 lb (93.441 kg)  03/10/14 200 lb (90.719 kg)  03/06/14 200 lb (90.719 kg)  01/12/14 207 lb 6 oz (94.065 kg)  12/30/13 210 lb (95.255 kg)    Usual Body Weight: 200 lbs  % Usual Body Weight: 97.5%  BMI:  Body mass index is 28.78 kg/(m^2).  Estimated Nutritional Needs: Kcal: 2100-2300 Protein: 90-105 grams Fluid: 2.3-2.6 L/day  Skin: intact  Diet Order: Diet clear liquid  EDUCATION NEEDS: -No education needs identified at this time   Intake/Output Summary (Last 24 hours) at 06/23/14 1409 Last data filed at 06/23/14 1300  Gross per 24 hour  Intake      0 ml  Output   1695 ml  Net  -1695 ml    Last BM: 11/6  Labs:   Recent Labs Lab 06/21/14 1826 06/22/14 0450 06/23/14 0435  NA 140 140 137  K 4.3 2.9* 3.7  CL 99 101 99  CO2 23 24 20   BUN 28* 19 14  CREATININE 0.93 0.81 0.73  CALCIUM 10.2 9.2 9.8  MG  --  2.2  --   PHOS  --  2.5  --   GLUCOSE 106* 97 125*    CBG (last 3)  Recent Labs  06/22/14 2357 06/23/14 0733 06/23/14 1131  GLUCAP 88 100* 130*    Scheduled Meds: . allopurinol  200 mg Oral Daily  . docusate sodium  100 mg Oral BID  . DULoxetine  90 mg Oral Daily  . enoxaparin (LOVENOX) injection  40 mg Subcutaneous Q24H  . hydrALAZINE  5 mg Intravenous 4 times per day  . insulin aspart  0-9 Units Subcutaneous 6 times per day  . methylphenidate  54 mg Oral Daily  . metoCLOPramide (REGLAN) injection  5 mg Intravenous 4 times per day  . ondansetron  4 mg Oral Q12H  . pantoprazole (PROTONIX) IV  40 mg Intravenous Q12H  . pregabalin  200 mg Oral BID    Continuous Infusions: . sodium chloride 100 mL/hr at 06/23/14 1239     Past Medical History  Diagnosis Date  . COLONIC POLYPS 02/01/2008  . DIABETES MELLITUS, TYPE II 05/20/2010  . HYPERLIPIDEMIA 10/09/2008  . ANXIETY DEPRESSION 02/01/2008  . ERECTILE DYSFUNCTION 10/09/2008  . ADD 10/09/2008  . SLEEP APNEA, OBSTRUCTIVE 02/01/2008  . MORTON'S NEUROMA 05/20/2010  . PERIPHERAL NEUROPATHY 05/20/2010  . Other specified forms of hearing loss 06/27/2009  . HYPERTENSION 10/09/2008  . HEMORRHOIDS 02/01/2008  . ALLERGIC RHINITIS 10/09/2008  . Stricture and stenosis of esophagus 02/02/2008  . GERD 02/01/2008  . HIATAL HERNIA 02/01/2008  . ERECTILE DYSFUNCTION, ORGANIC 05/20/2010  . WRIST PAIN, LEFT 12/05/2009  . FOOT PAIN, LEFT 05/20/2010  . PERIPHERAL EDEMA 05/20/2010  . DYSPNEA 03/12/2010  . Abdominal pain, unspecified site 01/19/2009  . Abdominal pain, left lower quadrant 06/06/2010  . Type II or unspecified type diabetes mellitus without mention of complication, uncontrolled 11/14/2010    Past Surgical History  Procedure Laterality Date  . Carpal tunnel release    . Rotator cuff repair    . Esophageal dilation  july 2009  . Eye surgery      Pryor Ochoa RD, LDN Inpatient Clinical Dietitian Pager: [phone] After Hours Pager: [phone]

## 2014-06-23 NOTE — Progress Notes (Addendum)
CARE MANAGEMENT NOTE 06/23/2014  Patient:  Frank [name], Frank [name]   Account Number:  [id number]  Date Initiated:  06/23/2014  Documentation initiated by:  Edwyna Shell  Subjective/Objective Assessment:   58 yo male admitted with N/V from home with partner, has PCP, transportation, and stated he can afford his medications with insurance coverage     Action/Plan:   discharge planning   Anticipated DC Date:  06/24/2014   Anticipated DC Plan:  [place]  CM consult      Choice offered to / List presented to:  C-1 Patient        [place] arranged  [place]   Status of service:  Completed, signed off Medicare Important Message given?   (If response is "NO", the following Medicare IM given date fields will be blank) Date Medicare IM given:   Medicare IM given by:   Date Additional Medicare IM given:   Additional Medicare IM given by:    Discharge Disposition:  [place]  Per UR Regulation:    If discussed at Long Length of Stay Meetings, dates discussed:    Comments:  06/23/14 Edwyna Shell RN CM [phone] Patient declined recommendation for SNF and partner requested [facility] services with [name]. Danne Baxter stated that they have a walker, over toilet seat, and grab bars in the BR. Provided a list of private duty sitter agencies also. Left message for MD requesting order for [facility] PT and SW, awaiting final order. Shaune Leeks aware of the referral.  06/23/14 Edwyna Shell RN, BSN, CM No orders and no CM needs identified

## 2014-06-23 NOTE — Evaluation (Addendum)
Occupational Therapy Evaluation Patient Details Name: Frank [name] MRN: [id number] DOB: Apr 16, 1956 Today's Date: 06/23/2014    History of Present Illness 58 yo Male adm 06/21/14 through [facility] ED with progressive weakness, nausea,  vomiting, poor oral intake and  history of L lateral malleolus fracturea and limited mobility since  (followed by Dr. Charlann Boxer at [facility])  PMHx: chronic back pain, frequent falls at home. This has been associated with poor mobility, inability to bear weight, occasional LE numbness and tingling.     Clinical Impression   Pt was admitted with nausea/vomiting and frequent falls.  He will benefit from skilled OT to increase safety and independence with adls.  Pt was vague about PLOF:  Uncertain of amount of assistance needed.  Pt felt poorly during evaluation and did not engage in ADLs--he needs extensive assistance at this time.  Goals are for min to mod A in acute setting.    Follow Up Recommendations  SNF    Equipment Recommendations  3 in 1 bedside comode    Recommendations for Other Services       Precautions / Restrictions Precautions Precautions: Back Precaution Comments: chronic back pain Restrictions Weight Bearing Restrictions: No Other Position/Activity Restrictions: pt has h/o R ankle fx:  per Dr Thompson Caul note on 9/8 he walked into office on his own.  Pt reports he has been using a walker at home due to weakness      Mobility Bed Mobility Overal bed mobility: Needs Assistance;+2 for physical assistance Bed Mobility: Rolling;Sidelying to Sit;Sit to Sidelying Rolling: Min guard Sidelying to sit: +2 for physical assistance;Max assist     Sit to sidelying: +2 for physical assistance;Max assist General bed mobility comments: multimodal cues for all aspects of rolling and sidelying to sit technique; +2 to bring trunk forward and to control descent  and LEs off/on bed; lateral scooting along EOB with +2 mod/max assist for wt shfit and safety,  multimodal cues for sequence/technique  Transfers                 General transfer comment: pt declined OOB    Balance Overall balance assessment: Needs assistance;History of Falls Sitting-balance support: Bilateral upper extremity supported;Feet supported Sitting balance-Leahy Scale: Poor Sitting balance - Comments: multi-directional LOB in sitting; pt was able to static sit ~1 min at a time without assist, close supervision for safety and bil UE support       Standing balance comment: not tested, pt declined                            ADL Overall ADL's : Needs assistance/impaired                                       General ADL Comments: pt sat EOB and was unsteady.  He was agreeable to this but generally not feeling well and not feeling up to ADL tasks.  Pt needed bil UEs to support himself sitting EOB and propped on L elbow during evaluation. Pt not agreeable to standing at this time.  He sat EOB x 5 minutes.  Will continue to assess ADLs--total A at this time due to not feeling up to tasks     Vision                     Perception  Praxis      Pertinent Vitals/Pain Pain Assessment: Faces Pain Score: 6  Faces Pain Scale: Hurts little more Pain Location: back and R hip Pain Intervention(s): Limited activity within patient's tolerance;Monitored during session;Repositioned     Hand Dominance     Extremity/Trunk Assessment Upper Extremity Assessment Upper Extremity Assessment: Generalized weakness (MMT 3/5 but pt used arms functionally to assist with scooting.  ROM awkward)          Communication Communication Communication: No difficulties   Cognition Arousal/Alertness: Lethargic;Suspect due to medications Behavior During Therapy: Flat affect Overall Cognitive Status: No family/caregiver present to determine baseline cognitive functioning                     General Comments       Exercises        Shoulder Instructions      Home Living Family/patient expects to be discharged to:: Unsure Living Arrangements: Spouse/significant other (partner)   Type of Home: House Home Access: Stairs to enter [employer] of Steps: 2   Home Layout: One level               Home Equipment: Cane - single point;Walker - standard   Additional Comments: pt's partner works during day and is available in evenings to assist.  OT asked about bathroom, but pt vague--did not answer what DME he has      Prior Functioning/Environment Level of Independence: Needs assistance        Comments: pt is not specific about his prior level of function, he states he was having "a lot of falls" and was spending much time "in bed and on the couch"    OT Diagnosis: Generalized weakness   OT Problem List: Decreased strength;Decreased activity tolerance;Impaired balance (sitting and/or standing);Decreased cognition;Decreased safety awareness;Decreased knowledge of use of DME or AE;Pain   OT Treatment/Interventions: Self-care/ADL training;Energy conservation;DME and/or AE instruction;Therapeutic activities;Patient/family education;Balance training;Cognitive remediation/compensation    OT Goals(Current goals can be found in the care plan section) Acute Rehab OT Goals Patient Stated Goal: to get stronger OT Goal Formulation: With patient Time For Goal Achievement: 07/07/14 Potential to Achieve Goals: Good ADL Goals Pt Will Transfer to Toilet: stand pivot transfer;bedside commode;with mod assist Pt Will Perform Toileting - Clothing Manipulation and hygiene: with min assist;sit to/from stand Additional ADL Goal #1: pt will complete UB adls from supported seated position, with set up Additional ADL Goal #2: pt will complete LB adls with mod A, using AE as needed, sit to stand Additional ADL Goal #3: Pt will perform bed mobility with min A in preparation for adls Additional ADL Goal #4: Pt will initiate  and sustain attention to tasks during adls without cues  OT Frequency: Min 2X/week   Barriers to D/C:            Co-evaluation PT/OT/SLP Co-Evaluation/Treatment: Yes Reason for Co-Treatment: Complexity of the patient's impairments (multi-system involvement);For patient/therapist safety PT goals addressed during session: Mobility/safety with mobility OT goals addressed during session: Strengthening/ROM      End of Session    Activity Tolerance: Patient limited by fatigue;Patient limited by lethargy Patient left: in bed;with call bell/phone within reach;with bed alarm set   Time: 1131-1149 OT Time Calculation (min): 18 min Charges:  OT General Charges $OT Visit: 1 Procedure OT Evaluation $Initial OT Evaluation Tier I: 1 Procedure OT Treatments $Therapeutic Activity: 8-22 mins G-Codes:    Jordan Caraveo 29-Jun-2014, 12:48 PM Lesle Chris, OTR/L [phone] 06-29-14

## 2014-06-23 NOTE — Progress Notes (Signed)
[facility] Progress Note    Since last GI note: Was sleeping, woke fairly easily. Says he "hurts everywhere" and is not hungry.  Objective: Vital signs in last 24 hours: Temp:  [97.6 F (36.4 C)-98.5 F (36.9 C)] 97.8 F (36.6 C) (11/13 0525) Pulse Rate:  [91-125] 125 (11/13 0525) Resp:  [16-20] 18 (11/13 0525) BP: (163-191)/(83-110) 164/96 mmHg (11/13 0525) SpO2:  [94 %-100 %] 96 % (11/13 0525) Last BM Date: 06/16/14 General: alert and oriented times 3 Heart: regular rate and rythm Abdomen: soft, non-tender, non-distended, normal bowel sounds   Lab Results:  Recent Labs  06/21/14 1826 06/22/14 0450 06/23/14 0435  WBC 10.8* 8.6 10.3  HGB 17.1* 15.4 16.6  PLT 233 203 217  MCV 87.4 86.9 87.1    Recent Labs  06/21/14 1826 06/22/14 0450 06/23/14 0435  NA 140 140 137  K 4.3 2.9* 3.7  CL 99 101 99  CO2 _0 GLUCOSE 106* 97 125*  BUN 28* 19 14  CREATININE 0.93 0.81 0.73  CALCIUM 10.2 9.2 9.8    Recent Labs  06/21/14 1826 06/22/14 0450  PROT 8.9* 8.0  ALBUMIN 4.7 4.3  AST 34 20  ALT 22 20  ALKPHOS 69 68  BILITOT 1.0 1.0   Studies/Results: Dg Abd 1 View  06/21/2014   CLINICAL DATA:  Constipation.  Generalized abdominal pain.  EXAM: ABDOMEN - 1 VIEW  COMPARISON:  06/17/2014  FINDINGS: Moderate stool burden throughout the colon. Nonobstructive bowel gas pattern. No free air. No organomegaly or suspicious calcification. Visualized lung bases are clear.  IMPRESSION: Moderate stool burden.  No acute findings.   Electronically Signed   By: Rolm Baptise M.D.   On: 06/21/2014 21:11   Mr Thoracic Spine Wo Contrast  06/22/2014   CLINICAL DATA:  Back pain after a lifting injury. This occurred several days ago. Initial encounter.  EXAM: MRI THORACIC SPINE WITHOUT CONTRAST  TECHNIQUE: Multiplanar, multisequence MR imaging of the thoracic spine was performed. No intravenous contrast was administered.  COMPARISON:  CT chest 06/17/2014.  FINDINGS:  Segmentation: Normal. The thoracic spine was labeled by counting down from the odontoid. There is agreement with numbering scheme used for the lumbar MRI.  Alignment:  Normal.  Vertebrae: No worrisome osseous lesions. Minor Schmorl's nodes without compression deformity. Exuberant osteophytic spurring anteriorly and to the RIGHT, commonly seen in this age group.  Spinal cord: Normal in size and signal except for T7-8, described below. No intraspinal masses.  Paraspinal tissues: No worrisome extra-spinal abnormalities.  Disc levels:  The thoracic disc levels are within normal limits except for T7-8, where there is a LEFT paracentral extrusion. This disc herniation effaces the anterior subarachnoid space and mildly flattens the cord, as seen on image 23 series 8. Again, no abnormal cord signal. No other areas of significant thoracic disc pathology.  IMPRESSION: LEFT paracentral disc extrusion at T7-8. Effacement of the anterior subarachnoid space and mild cord flattening. No abnormal cord signal.  No compression deformity or other a worrisome finding.   Electronically Signed   By: Rolla Flatten M.D.   On: 06/22/2014 18:19     Medications: Scheduled Meds: . allopurinol  200 mg Oral Daily  . docusate sodium  100 mg Oral BID  . DULoxetine  90 mg Oral Daily  . enoxaparin (LOVENOX) injection  40 mg Subcutaneous Q24H  . hydrALAZINE  5 mg Intravenous 4 times per day  . insulin aspart  0-9 Units Subcutaneous 6 times per day  .  methylphenidate  54 mg Oral Daily  . metoCLOPramide (REGLAN) injection  5 mg Intravenous 4 times per day  . ondansetron  4 mg Oral Q12H  . pantoprazole (PROTONIX) IV  40 mg Intravenous Q12H  . pregabalin  200 mg Oral BID   Continuous Infusions: . sodium chloride 100 mL/hr at 06/23/14 0330   PRN Meds:.acetaminophen **OR** acetaminophen, hydrALAZINE, HYDROmorphone (DILAUDID) injection, LORazepam, [DISCONTINUED] ondansetron **OR** ondansetron (ZOFRAN) IV, oxyCODONE-acetaminophen **AND**  [DISCONTINUED] oxyCODONE, promethazine    Assessment/Plan: 58 y.o. male with myriad GI complaints and essentially full body pain, discomfort  Given normal CBC , normal CMET I think it is unlikely anything serious is going on. It is very possible that his GI issues are related to his chronic daily narcotic usage. He may have withdrawal symptoms as they are decreased however I do recommend it is in his best interested to try to cut back on the narcotic pain meds as best as possible.  I changed him to scheduled zofran (twice daily) and he is on reglan 5 q6 hours.  He needs to be encouraged to ambulate, try to eat.   Milus Banister, MD  06/23/2014, 7:55 AM [facility] Pager [phone]

## 2014-06-23 NOTE — Progress Notes (Signed)
Clinical Social Work Department BRIEF PSYCHOSOCIAL ASSESSMENT 06/23/2014  Patient:  Frank [name], Frank [name]     Account Number:  [id number]     [place] date:  06/21/2014  Clinical Social Worker:  Earlie Server  Date/Time:  06/23/2014 02:30 PM  Referred by:  Physician  Date Referred:  06/23/2014 Referred for  SNF Placement   Other Referral:   Interview type:  Patient Other interview type:    PSYCHOSOCIAL DATA Living Status:  FAMILY Admitted from facility:   Level of care:   Primary support name:  Frank [name] Primary support relationship to patient:  PARTNER Degree of support available:   Strong    CURRENT CONCERNS Current Concerns  Post-Acute Placement   Other Concerns:    SOCIAL WORK ASSESSMENT / PLAN CSW received referral in order to assist with DC planning. CSW reviewed chart and met with patient at bedside. CSW introduced myself and explained role.    Patient laying in bed and sleeping when CSW arrived. Patient woke up when CSW arrived but minimally engaged and avoided eye contact throughout assessment. Patient reports he lives with partner of 25 years and plans to DC back home with partner as soon as possible. CSW spoke with patient re: PT/OT recommendations for SNF placement at DC. Patient adamantly refusing SNF placement and reports he would "never go to a nursing home" and only wants to return home. CSW explained [facility] SN[facility] and the need for rehab but patient continues to refuse. CSW asked permission to speak with partner and patient agreeable.    CSW spoke with partner via phone who reports that patient has been living at home and has not been very mobile since breaking his ankle in July. Partner works during the day and reports he has already been helping patient with bathing and ADLs. CSW explained PT/OT recommendations for SNF placement and partner is aware but reports that patient will never agree to SNF placement. Partner used [facility] in the past for [facility] and reports that they would be  comfortable with that service again if needed. CSW made CM aware of possible HH needs.    CSW is signing off but available if further needs arise.   Assessment/plan status:  No Further Intervention Required Other assessment/ plan:   Information/referral to community resources:   SNF information    PATIENT'S/FAMILY'S RESPONSE TO PLAN OF CARE: Patient oriented but drowsy during assessment. Patient withdrawn and not interested in CSW assistance for SNF placement. Patient reports he is used to staying by himself during the day and is not interested in SNF placement. Partner thanked CSW for call but reports that he is used to helping patient and knows that patient only wants to come home. Partner feels [place] would be beneficial and is hopeful that patient will participate in services.       [place], [place] [phone]

## 2014-06-23 NOTE — Progress Notes (Signed)
Patient ID: Frank [name], male   DOB: 26-Feb-1956, 58 y.o.   MRN: [id number]  [facility] PROGRESS NOTE  Kassius Battiste PJK:[id number] DOB: 02-Aug-1956 DOA: 06/21/2014 PCP: Cathlean Cower, MD   Brief narrative: 58 y.o. Male with history of ankle fracture and follows with sports medicine, chronic back pain (on multiple analgesics including Percocet, Nucynta, lyrica, mobic), frequent falls at home, presented to [facility] ED with main concern of progressive weakness, nausea, non bloody vomiting, poor oral intake. He was sent from PCP office to the ED for further evaluation. In addition, pt reported mid area back pain, constant and throbbing, 10/10 in severity, radiating to the sides and to the front epigastric area, with no specific alleviating factors. This has been associated with poor mobility, inability to bear weight, occasional LE numbness and tingling.   Assessment and Plan:    Active Problems:  Nausea, vomiting, poor oral intake - no clear etiology at this time  - possibly related to multiple narcotic medications, ? Gastroparesis - placed on Reglan 5 mg IV Q6 hours - minimize narcotic use as possible  - no aortic dissection or aneurysm noted on recent Ct angio abd 11/07 - appreciate GI input, no indication for GI intervention at this time   Diabetes mellitus, with complications of neuropathies  - keep on SSI for now until oral intake improves - readjust the regimen as indicated   Hypokalemia - from vomiting - supplemented and WNL this AM - repeat BMP in AM  Accelerated HTN - pt refusing PO hydralazine  - continue Hydralazine IV scheduled and as needed for now - transition to oral regimen when able to take PO   Chronic back pain  - MRI lumbar spine with mild lumbar spine degenerative changes, no focal disc herniation, no definite nerve root encroachment - MRI thoracic spine with left paracentral disc extrusion at T7-8, no abnormal cord signal, no compression deformity or  other a worrisome finding - awaiting PT/  Generalized weakness - appears to be secondary to progressive deconditioning - PT/OT evaluation requested   DVT prophylaxis  Lovenox SQ while pt is in hospital  Code Status: DNR Family Communication: Partner at bedside Disposition Plan: Home when medically stable   IV Access:    Peripheral IV Procedures and diagnostic studies:    Dg Abd 1 View 06/21/2014 Moderate stool burden. No acute findings. Mr Thoracic Spine Wo Contrast  06/22/2014   LEFT paracentral disc extrusion at T7-8. Effacement of the anterior subarachnoid space and mild cord flattening. No abnormal cord signal.  No compression deformity or other a worrisome finding. Mr Lumbar Spine Wo Contrast  06/20/2014 Transitional lumbosacral anatomy.  L5 is largely sacralized. 2. Mild lumbar spine degenerative changes, greatest at L3-4 where there is annular disc bulging, facet and ligamentous hypertrophy contributing to mild lateral recess and foraminal stenosis bilaterally. 3. No focal disc herniation or definite nerve root encroachment.  Medical Consultants:    GI Other Consultants:    Physical therapy   OT Anti-Infectives:    None   Faye Ramsay, MD  [place] Pager [phone]  If 7PM-7AM, please contact night-coverage [email or web address] Password [email or web address] 06/23/2014, 9:00 AM   LOS: 2 days   HPI/Subjective: No events overnight. Pt reports persistent generalized pain.   Objective: Filed Vitals:   06/22/14 1356 06/22/14 1546 06/22/14 2117 06/23/14 0525  BP: 191/110 175/92 163/83 164/96  Pulse: 94 102 108 125  Temp: 97.7 F (36.5 C) 97.6 F (36.4 C) 98.5 F (36.9 C) 97.8 F (36.6 C)  TempSrc: Axillary Axillary Oral Oral  Resp: 20 20 16 18   Height:      Weight:      SpO2: 100% 94% 96% 96%    Intake/Output Summary (Last 24 hours) at 06/23/14 0900 Last data filed at 06/23/14 0534  Gross per 24 hour  Intake      0 ml  Output   1695 ml  Net  -1695 ml     Exam:   General:  Pt is alert, follows commands appropriately,in mild distress due to pain   Cardiovascular: Regular rhythm, tachycardic, S1/S2, no murmurs, no rubs, no gallops  Respiratory: Clear to auscultation bilaterally, no wheezing, no crackles, no rhonchi  Abdomen: Soft, non tender, non distended, bowel sounds present, no guarding  Data Reviewed: Basic Metabolic Panel:  Recent Labs Lab 06/17/14 1936 06/20/14 1020 06/21/14 1826 06/22/14 0450 06/23/14 0435  NA 142 139 140 140 137  K 4.4 3.9 4.3 2.9* 3.7  CL 102 100 99 101 99  CO2 25 24 23 24 20   GLUCOSE 128* 131* 106* 97 125*  BUN 17 18 28* 19 14  CREATININE 1.02 0.86 0.93 0.81 0.73  CALCIUM 10.7* 9.6 10.2 9.2 9.8  MG  --   --   --  2.2  --   PHOS  --   --   --  2.5  --    Liver Function Tests:  Recent Labs Lab 06/17/14 1936 06/21/14 1826 06/22/14 0450  AST 17 34 20  ALT 19 22 20   ALKPHOS 74 69 68  BILITOT 0.8 1.0 1.0  PROT 8.0 8.9* 8.0  ALBUMIN 4.3 4.7 4.3    Recent Labs Lab 06/17/14 1936  LIPASE 17   CBC:  Recent Labs Lab 06/17/14 1936 06/20/14 1020 06/21/14 1826 06/22/14 0450 06/23/14 0435  WBC 8.8 8.3 10.8* 8.6 10.3  NEUTROABS 5.4 5.6 7.5  --   --   HGB 15.9 15.1 17.1* 15.4 16.6  HCT 44.0 41.9 47.1 43.0 46.1  MCV 87.3 86.2 87.4 86.9 87.1  PLT 238 236 233 203 217   CBG:  Recent Labs Lab 06/22/14 1204 06/22/14 1659 06/22/14 1954 06/22/14 2357 06/23/14 0733  GLUCAP 104* 119* 118* 88 100*   Scheduled Meds: . allopurinol  200 mg Oral Daily  . docusate sodium  100 mg Oral BID  . DULoxetine  90 mg Oral Daily  . enoxaparin (LOVENOX) injection  40 mg Subcutaneous Q24H  . hydrALAZINE  5 mg Intravenous 4 times per day  . insulin aspart  0-9 Units Subcutaneous 6 times per day  . methylphenidate  54 mg Oral Daily  . metoCLOPramide (REGLAN) injection  5 mg Intravenous 4 times per day  . ondansetron  4 mg Oral Q12H  . pantoprazole (PROTONIX) IV  40 mg Intravenous Q12H  .  pregabalin  200 mg Oral BID   Continuous Infusions: . sodium chloride 100 mL/hr at 06/23/14 0330

## 2014-06-24 ENCOUNTER — Inpatient Hospital Stay (HOSPITAL_COMMUNITY): Payer: BC Managed Care – PPO

## 2014-06-24 LAB — GLUCOSE, CAPILLARY
Glucose-Capillary: 134 mg/dL — ABNORMAL HIGH (ref 70–99)
Glucose-Capillary: 136 mg/dL — ABNORMAL HIGH (ref 70–99)
Glucose-Capillary: 138 mg/dL — ABNORMAL HIGH (ref 70–99)
Glucose-Capillary: 138 mg/dL — ABNORMAL HIGH (ref 70–99)
Glucose-Capillary: 143 mg/dL — ABNORMAL HIGH (ref 70–99)
Glucose-Capillary: 146 mg/dL — ABNORMAL HIGH (ref 70–99)

## 2014-06-24 LAB — CBC
HEMATOCRIT: 44.5 % (ref 39.0–52.0)
HEMATOCRIT: 42.4 % (ref 39.0–52.0)
HEMOGLOBIN: 14.9 g/dL (ref 13.0–17.0)
Hemoglobin: 16.2 g/dL (ref 13.0–17.0)
MCH: 32 pg (ref 26.0–34.0)
MCH: 30.6 pg (ref 26.0–34.0)
MCHC: 36.4 g/dL — ABNORMAL HIGH (ref 30.0–36.0)
MCHC: 35.1 g/dL (ref 30.0–36.0)
MCV: 87.8 fL (ref 78.0–100.0)
MCV: 87.1 fL (ref 78.0–100.0)
PLATELETS: 270 10*3/uL (ref 150–400)
Platelets: 275 10*3/uL (ref 150–400)
RBC: 5.07 MIL/uL (ref 4.22–5.81)
RBC: 4.87 MIL/uL (ref 4.22–5.81)
RDW: 13.3 % (ref 11.5–15.5)
RDW: 13.4 % (ref 11.5–15.5)
WBC: 23.9 10*3/uL — AB (ref 4.0–10.5)
WBC: 18.9 10*3/uL — AB (ref 4.0–10.5)

## 2014-06-24 LAB — BASIC METABOLIC PANEL
ANION GAP: 18 — AB (ref 5–15)
BUN: 25 mg/dL — ABNORMAL HIGH (ref 6–23)
CHLORIDE: 101 meq/L (ref 96–112)
CO2: 20 mEq/L (ref 19–32)
CREATININE: 0.75 mg/dL (ref 0.50–1.35)
Calcium: 9.7 mg/dL (ref 8.4–10.5)
GFR calc non Af Amer: >90 mL/min (ref >90)
Glucose, Bld: 140 mg/dL — ABNORMAL HIGH (ref 70–99)
POTASSIUM: 3.8 meq/L (ref 3.7–5.3)
Sodium: 139 mEq/L (ref 137–147)

## 2014-06-24 LAB — URINE MICROSCOPIC-ADD ON

## 2014-06-24 LAB — URINALYSIS, ROUTINE W REFLEX MICROSCOPIC
GLUCOSE, UA: 250 mg/dL — AB
HGB URINE DIPSTICK: NEGATIVE
Ketones, ur: >80 mg/dL — AB
Leukocytes, UA: NEGATIVE
Nitrite: NEGATIVE
PH: 5.5 (ref 5.0–8.0)
Protein, ur: 30 mg/dL — AB
SPECIFIC GRAVITY, URINE: 1.024 (ref 1.005–1.030)
Urobilinogen, UA: 0.2 mg/dL (ref 0.0–1.0)

## 2014-06-24 LAB — STREP PNEUMONIAE URINARY ANTIGEN: STREP PNEUMO URINARY ANTIGEN: NEGATIVE

## 2014-06-24 MED ORDER — HYDRALAZINE HCL 20 MG/ML IJ SOLN
10.0000 mg | Freq: Four times a day (QID) | INTRAMUSCULAR | Status: DC
Start: 2014-06-24 — End: 2014-06-28
  Administered 2014-06-24 – 2014-06-26 (×10): 10 mg via INTRAVENOUS
  Administered 2014-06-26: 20 mg via INTRAVENOUS
  Administered 2014-06-27 – 2014-06-28 (×6): 10 mg via INTRAVENOUS
  Filled 2014-06-24: qty 0.5
  Filled 2014-06-24 (×2): qty 1
  Filled 2014-06-24 (×15): qty 0.5

## 2014-06-24 MED ORDER — VANCOMYCIN HCL IN DEXTROSE 1-5 GM/200ML-% IV SOLN
1000.0000 mg | Freq: Three times a day (TID) | INTRAVENOUS | Status: DC
  Administered 2014-06-24 – 2014-06-28 (×12): 1000 mg via INTRAVENOUS
  Filled 2014-06-24 (×13): qty 200

## 2014-06-24 MED ORDER — DEXTROSE 5 % IV SOLN
1.0000 g | Freq: Three times a day (TID) | INTRAVENOUS | Status: DC
  Administered 2014-06-24 – 2014-06-28 (×12): 1 g via INTRAVENOUS
  Filled 2014-06-24 (×13): qty 1

## 2014-06-24 NOTE — Progress Notes (Signed)
CXR with possible developing PNA. Place on ABX vancomycin and maxipime for HCAP. Ordered placed.  Faye Ramsay, MD  [facility] Pager [phone] [facility]ll [phone]  If 7PM-7AM, please contact night-coverage [email or web address] Password [email or web address]

## 2014-06-24 NOTE — Progress Notes (Signed)
Patient ID: Frank [name], male   DOB: 04-19-1956, 58 y.o.   MRN: [id number]  [facility] PROGRESS NOTE  Frank [name] UXL:[id number] DOB: [birth year] DOA: 06/21/2014 PCP: Cathlean Cower, MD    Brief narrative: 58 y.o. Male with history of ankle fracture and follows with sports medicine, chronic back pain (on multiple analgesics including Percocet, Nucynta, lyrica, mobic), frequent falls at home, presented to [facility] ED with main concern of progressive weakness, nausea, non bloody vomiting, poor oral intake. He was sent from PCP office to the ED for further evaluation. In addition, pt reported mid area back pain, constant and throbbing, 10/10 in severity, radiating to the sides and to the front epigastric area, with no specific alleviating factors. This has been associated with poor mobility, inability to bear weight, occasional LE numbness and tingling.   Assessment and Plan:    Active Problems:  Nausea, vomiting, poor oral intake - possibly related to multiple narcotic medications, ? Gastroparesis - placed on Reglan 5 mg IV Q6 hours, pt still reports generalized pain, no episodes of vomiting recorder over the past 48 hours  - minimize narcotic use as possible  - no aortic dissection or aneurysm noted on recent Ct angio abd 11/07 - appreciate GI input, no indication for GI intervention at this time  - encouraged PO intake    Leukocytosis - no clear etiology, sudden increase over the past 24 hours - will repeat CBC to make sure not blood work error   Diabetes mellitus, with complications of neuropathies  - keep on SSI for now until oral intake improves - readjust the regimen as indicated   Hypokalemia - from vomiting - supplemented and WNL this AM - repeat BMP in AM  Accelerated HTN - pt refusing PO hydralazine  - continue Hydralazine IV scheduled and as needed for now - transition to oral regimen when able to take PO   Chronic back pain  - MRI lumbar spine with mild  lumbar spine degenerative changes, no focal disc herniation, no definite nerve root encroachment - MRI thoracic spine with left paracentral disc extrusion at T7-8, no abnormal cord signal, no compression deformity or other a worrisome finding - PT/OT done, recommendation is to proceed with SNF placement but pt refusing to go to SNF - will discuss recommendations with family, pt is unsafe for d/c home   Urinary retention - foley placed   Generalized weakness - appears to be secondary to progressive deconditioning - PT/OT evaluation requested   DVT prophylaxis  Lovenox SQ while pt is in hospital  Code Status: DNR Family Communication: Partner at bedside Disposition Plan: Remains inpatient   IV Access:    Peripheral IV Procedures and diagnostic studies:    Dg Abd 1 View 06/21/2014 Moderate stool burden. No acute findings.  Mr Thoracic Spine Wo Contrast 06/22/2014 LEFT paracentral disc extrusion at T7-8. Effacement of the anterior subarachnoid space and mild cord flattening. No abnormal cord signal. No compression deformity or other a worrisome finding.  Mr Lumbar Spine Wo Contrast 06/20/2014 Transitional lumbosacral anatomy. L5 is largely sacralized. 2. Mild lumbar spine degenerative changes, greatest at L3-4 where there is annular disc bulging, facet and ligamentous hypertrophy contributing to mild lateral recess and foraminal stenosis bilaterally. 3. No focal disc herniation or definite nerve root encroachment.  Medical Consultants:    GI Other Consultants:    Physical therapy   OT Anti-Infectives:    None  Faye Ramsay, MD  [place] Pager [phone]  If 7PM-7AM, please contact night-coverage [email or web address]  Password [email or web address] 06/24/2014, 11:26 AM   LOS: 3 days   HPI/Subjective: No events overnight.   Objective: Filed Vitals:   06/23/14 2200 06/24/14 0013 06/24/14 0359 06/24/14 0500  BP: 167/89 172/103 188/95 184/85  Pulse: 103   111  Temp:  97.7 F (36.5 C)   97.4 F (36.3 C)  TempSrc: Oral   Oral  Resp: 18   18  Height:      Weight:      SpO2: 95%   92%    Intake/Output Summary (Last 24 hours) at 06/24/14 1126 Last data filed at 06/23/14 2100  Gross per 24 hour  Intake      0 ml  Output    275 ml  Net   -275 ml    Exam:   General:  Pt is somnolent, minimally verbal and answers yes and no only   Cardiovascular: Regular rhythm, tachycardic, S1/S2, no murmurs, no rubs, no gallops  Respiratory: Diminished inspiratory effort, poor air movement at bases   Abdomen: Soft, non tender, non distended, bowel sounds present, no guarding  Extremities: pulses DP and PT palpable bilaterally  Data Reviewed: Basic Metabolic Panel:  Recent Labs Lab 06/20/14 1020 06/21/14 1826 06/22/14 0450 06/23/14 0435 06/24/14 0515  NA 139 140 140 137 139  K 3.9 4.3 2.9* 3.7 3.8  CL 100 99 101 99 101  CO2 24 23 24 20 20   GLUCOSE 131* 106* 97 125* 140*  BUN 18 28* 19 14 25*  CREATININE 0.86 0.93 0.81 0.73 0.75  CALCIUM 9.6 10.2 9.2 9.8 9.7  MG  --   --  2.2  --   --   PHOS  --   --  2.5  --   --    Liver Function Tests:  Recent Labs Lab 06/17/14 1936 06/21/14 1826 06/22/14 0450  AST 17 34 20  ALT 19 22 20   ALKPHOS 74 69 68  BILITOT 0.8 1.0 1.0  PROT 8.0 8.9* 8.0  ALBUMIN 4.3 4.7 4.3    Recent Labs Lab 06/17/14 1936  LIPASE 17   CBC:  Recent Labs Lab 06/17/14 1936 06/20/14 1020 06/21/14 1826 06/22/14 0450 06/23/14 0435 06/24/14 0515  WBC 8.8 8.3 10.8* 8.6 10.3 23.9*  NEUTROABS 5.4 5.6 7.5  --   --   --   HGB 15.9 15.1 17.1* 15.4 16.6 16.2  HCT 44.0 41.9 47.1 43.0 46.1 44.5  MCV 87.3 86.2 87.4 86.9 87.1 87.8  PLT 238 236 233 203 217 270   CBG:  Recent Labs Lab 06/23/14 1633 06/23/14 2002 06/23/14 2352 06/24/14 0408 06/24/14 0734  GLUCAP 106* 121* 134* 138* 138*   Scheduled Meds: . allopurinol  200 mg Oral Daily  . docusate sodium  100 mg Oral BID  . DULoxetine  90 mg Oral Daily  .  enoxaparin  injection  40 mg Subcutaneous Q24H  . hydrALAZINE  5 mg Intravenous 4 times per day  . insulin aspart  0-9 Units Subcutaneous 6 times per day  . methylphenidate  54 mg Oral Daily  . metoCLOPramide   5 mg Intravenous 4 times per day  . ondansetron  4 mg Oral Q12H  . Pantoprazole IV  40 mg Intravenous Q12H  . polyethylene glycol  17 g Oral BID  . pregabalin  200 mg Oral BID  . senna-docusate  1 tablet Oral BID   Continuous Infusions: . sodium chloride 100 mL/hr at 06/24/14 0010

## 2014-06-24 NOTE — Progress Notes (Signed)
ANTIBIOTIC CONSULT NOTE - INITIAL  Pharmacy Consult for cefepime, vancomycin Indication: HCAP  Allergies  Allergen Reactions  . Codeine     REACTION: Itching  . Metformin And Related     GI upset, diarrhea    Patient Measurements: Height: 5\' 9"  (175.3 cm) Weight: 195 lb (88.451 kg) IBW/kg (Calculated) : 70.7   Vital Signs: Temp: 98.2 F (36.8 C) (11/14 1429) Temp Source: Oral (11/14 1429) BP: 169/72 mmHg (11/14 1429) Pulse Rate: 111 (11/14 1429) Intake/Output from previous day: 11/13 0701 - 11/14 0700 In: 0  Out: 775 [Urine:775] Intake/Output from this shift:    Labs:  Recent Labs  06/22/14 0450 06/23/14 0435 06/24/14 0515 06/24/14 1147  WBC 8.6 10.3 23.9* 18.9*  HGB 15.4 16.6 16.2 14.9  PLT 203 217 270 275  CREATININE 0.81 0.73 0.75  --    Estimated Creatinine Clearance: 108.1 mL/min (by C-G formula based on Cr of 0.75). No results for input(s): VANCOTROUGH, VANCOPEAK, VANCORANDOM, GENTTROUGH, GENTPEAK, GENTRANDOM, TOBRATROUGH, TOBRAPEAK, TOBRARND, AMIKACINPEAK, AMIKACINTROU, AMIKACIN in the last 72 hours.   Microbiology: No results found for this or any previous visit (from the past 720 hour(s)).  Medical History: Past Medical History  Diagnosis Date  . COLONIC POLYPS 02/01/2008  . DIABETES MELLITUS, TYPE II 05/20/2010  . HYPERLIPIDEMIA 10/09/2008  . ANXIETY DEPRESSION 02/01/2008  . ERECTILE DYSFUNCTION 10/09/2008  . ADD 10/09/2008  . SLEEP APNEA, OBSTRUCTIVE 02/01/2008  . MORTON'S NEUROMA 05/20/2010  . PERIPHERAL NEUROPATHY 05/20/2010  . Other specified forms of hearing loss 06/27/2009  . HYPERTENSION 10/09/2008  . HEMORRHOIDS 02/01/2008  . ALLERGIC RHINITIS 10/09/2008  . Stricture and stenosis of esophagus 02/02/2008  . GERD 02/01/2008  . HIATAL HERNIA 02/01/2008  . ERECTILE DYSFUNCTION, ORGANIC 05/20/2010  . WRIST PAIN, LEFT 12/05/2009  . FOOT PAIN, LEFT 05/20/2010  . PERIPHERAL EDEMA 05/20/2010  . DYSPNEA 03/12/2010  . Abdominal pain, unspecified site  01/19/2009  . Abdominal pain, left lower quadrant 06/06/2010  . Type II or unspecified type diabetes mellitus without mention of complication, uncontrolled 11/14/2010    Medications:  Scheduled:  . allopurinol  200 mg Oral Daily  . ceFEPime (MAXIPIME) IV  1 g Intravenous Q8H  . docusate sodium  100 mg Oral BID  . DULoxetine  90 mg Oral Daily  . enoxaparin (LOVENOX) injection  40 mg Subcutaneous Q24H  . feeding supplement (RESOURCE BREEZE)  1 Container Oral BID PC  . hydrALAZINE  10 mg Intravenous 4 times per day  . insulin aspart  0-9 Units Subcutaneous 6 times per day  . methylphenidate  54 mg Oral Daily  . metoCLOPramide (REGLAN) injection  5 mg Intravenous 4 times per day  . ondansetron  4 mg Oral Q12H  . pantoprazole (PROTONIX) IV  40 mg Intravenous Q12H  . polyethylene glycol  17 g Oral BID  . pregabalin  200 mg Oral BID  . senna-docusate  1 tablet Oral BID   Infusions:  . sodium chloride 30 mL/hr at 06/24/14 1134   Assessment: 58 yo with hx ankle fx, chronic back pain on multiple narcotics, frequent falls presented to ER with progressive weakness, N/V and poor oral intake on 11/11. Patient now developing PNA to start vancomycin and cefepime for treatment of HCAP  11/14 >> cefepime >> 11/14 >> vancomycin >>  Tmax: afebrile WBCs: 18.9 Renal: SCr 0.75, CrCl > 100  Goal of Therapy:  Vancomycin trough level 15-20 mcg/ml  Plan:  1) Vancomycin 1g IV q8 2) Cefepime 1g IV q8  Adrian Saran, PharmD, BCPS Pager [phone] 06/24/2014 7:55 PM

## 2014-06-25 LAB — CBC
HCT: 44 % (ref 39.0–52.0)
HEMOGLOBIN: 15.2 g/dL (ref 13.0–17.0)
MCH: 30.8 pg (ref 26.0–34.0)
MCHC: 34.5 g/dL (ref 30.0–36.0)
MCV: 89.1 fL (ref 78.0–100.0)
PLATELETS: 264 10*3/uL (ref 150–400)
RBC: 4.94 MIL/uL (ref 4.22–5.81)
RDW: 13.9 % (ref 11.5–15.5)
WBC: 24.2 10*3/uL — ABNORMAL HIGH (ref 4.0–10.5)

## 2014-06-25 LAB — GLUCOSE, CAPILLARY
GLUCOSE-CAPILLARY: 145 mg/dL — AB (ref 70–99)
Glucose-Capillary: 126 mg/dL — ABNORMAL HIGH (ref 70–99)
Glucose-Capillary: 128 mg/dL — ABNORMAL HIGH (ref 70–99)
Glucose-Capillary: 134 mg/dL — ABNORMAL HIGH (ref 70–99)
Glucose-Capillary: 150 mg/dL — ABNORMAL HIGH (ref 70–99)
Glucose-Capillary: 150 mg/dL — ABNORMAL HIGH (ref 70–99)
Glucose-Capillary: 155 mg/dL — ABNORMAL HIGH (ref 70–99)

## 2014-06-25 LAB — BASIC METABOLIC PANEL
ANION GAP: 14 (ref 5–15)
BUN: 33 mg/dL — ABNORMAL HIGH (ref 6–23)
CALCIUM: 10 mg/dL (ref 8.4–10.5)
CO2: 25 meq/L (ref 19–32)
Chloride: 104 mEq/L (ref 96–112)
Creatinine, Ser: 0.83 mg/dL (ref 0.50–1.35)
GFR calc Af Amer: >90 mL/min (ref >90)
GLUCOSE: 156 mg/dL — AB (ref 70–99)
Potassium: 3.6 mEq/L — ABNORMAL LOW (ref 3.7–5.3)
SODIUM: 143 meq/L (ref 137–147)

## 2014-06-25 LAB — URINE CULTURE
COLONY COUNT: NO GROWTH
CULTURE: NO GROWTH

## 2014-06-25 MED ORDER — BISACODYL 10 MG RE SUPP
10.0000 mg | Freq: Two times a day (BID) | RECTAL | Status: DC
  Administered 2014-06-25 – 2014-06-28 (×5): 10 mg via RECTAL
  Filled 2014-06-25 (×4): qty 1

## 2014-06-25 MED ORDER — SENNOSIDES 8.8 MG/5ML PO SYRP
5.0000 mL | ORAL_SOLUTION | Freq: Two times a day (BID) | ORAL | Status: DC
  Administered 2014-06-25 – 2014-06-28 (×5): 5 mL via ORAL
  Filled 2014-06-25 (×8): qty 5

## 2014-06-25 MED ORDER — BISACODYL 10 MG RE SUPP: 10.0000 mg | Freq: Every day | RECTAL | Status: DC | PRN

## 2014-06-25 NOTE — Progress Notes (Signed)
Patient ID: Frank [name], male   DOB: July 23, 1956, 58 y.o.   MRN: [id number]  [facility] PROGRESS NOTE  Dhruva Orndoff [id number] DOB: [birth year] DOA: 06/21/2014 PCP: Cathlean Cower, MD   Brief narrative: 58 y.o. Male with history of ankle fracture and follows with sports medicine, chronic back pain (on multiple analgesics including Percocet, Nucynta, lyrica, mobic), frequent falls at home, presented to [facility] ED with main concern of progressive weakness, nausea, non bloody vomiting, poor oral intake. He was sent from PCP office to the ED for further evaluation. In addition, pt reported mid area back pain, constant and throbbing, 10/10 in severity, radiating to the sides and to the front epigastric area, with no specific alleviating factors. This has been associated with poor mobility, inability to bear weight, occasional LE numbness and tingling.   Assessment and Plan:    Active Problems:  Nausea, vomiting, poor oral intake - possibly related to multiple narcotic medications, ? Gastroparesis - placed on Reglan 5 mg IV Q6 hours, pt still reports generalized pain, no episodes of vomiting recorder over the past 48 hours  - minimize narcotic use as possible  - pt actually feeling better this AM and denies vomiting , he is more alert compared to yesterday  - no aortic dissection or aneurysm noted on recent Ct angio abd 11/07 - appreciate GI input, no indication for GI intervention at this time  - encouraged PO intake    Acute hypoxic respiratory failure - secondary to HCAP - ABX started - provide oxygen via [place]    Dysphagia - chronic in nature - pt is at high risk of aspiration  - will ask for SLP evaluation   Leukocytosis - possibly related to developing PNA noted on CXR 11/14 - pt started on Vancomycin and Maxipime 11/14, will continue for now as pt is doing better  - repeat CBC in AM  Diabetes mellitus, with complications of neuropathies  - keep on SSI for now until  oral intake improves - readjust the regimen as indicated   Hypokalemia - will continue to supplement  - repeat BMP in AM  Accelerated HTN - pt refusing PO hydralazine due to dysphagia  - continue Hydralazine IV scheduled and as needed for now - transition to oral regimen when able to take PO   Chronic back pain  - MRI lumbar spine with mild lumbar spine degenerative changes, no focal disc herniation, no definite nerve root encroachment - MRI thoracic spine with left paracentral disc extrusion at T7-8, no abnormal cord signal, no compression deformity or other a worrisome finding - PT/OT done, recommendation is to proceed with SNF placement but pt refusing to go to SNF - will discuss recommendations with family, pt is unsafe for d/c home  Urinary retention - foley placed   Generalized weakness - appears to be secondary to progressive deconditioning - PT/OT evaluation requested   DVT prophylaxis  Lovenox SQ while pt is in hospital  Code Status: DNR Family Communication: No family at bedside  Disposition Plan: Remains inpatient   IV Access:    Peripheral IV Procedures and diagnostic studies:    Dg Abd 1 View 06/21/2014 Moderate stool burden. No acute findings.  Mr Thoracic Spine Wo Contrast 11/12/2015L paracentral disc extrusion at T7-8. Effacement of ant subarachnoid space and mild cord flattening. No abnormal cord signal. No compression deformity or other a worrisome finding.  Mr Lumbar Spine Wo Contrast 06/20/2014 Transitional lumbosacral anatomy. L5 sacralized, lumbar spine degenerative changes, greatest at L3-4 where there  is annular disc bulging, facet and ligamentous hypertrophy contributing to mild lateral recess and foraminal stenosis bilaterally. No focal disc herniation or definite nerve root encroachment.  Dg Chest Port 1 View  06/24/2014   Left lower lung airspace opacities suspicious for pneumonia or aspiration.  Low volume film with mild  bibasilar atelectasis.   Medical Consultants:    GI Other Consultants:    Physical therapy   OT Anti-Infectives:    Vancomycin 11/14 -->  Maxipime 11/14 -->  Faye Ramsay, MD  [facility] Pager [phone]  If 7PM-7AM, please contact night-coverage [email or web address] Password [facility] 06/25/2014, 11:49 AM   LOS: 4 days   HPI/Subjective: No events overnight.   Objective: Filed Vitals:   06/24/14 1429 06/24/14 2056 06/25/14 0505 06/25/14 0759  BP: 169/72 155/82 180/83 157/76  Pulse: 111 116 113 104  Temp: 98.2 F (36.8 C) 98 F (36.7 C) 98.6 F (37 C) 97.4 F (36.3 C)  TempSrc: Oral Oral Oral Oral  Resp: 20 16 20 16   Height:      Weight:      SpO2: 92% 92% 92% 93%    Intake/Output Summary (Last 24 hours) at 06/25/14 1149 Last data filed at 06/25/14 0808  Gross per 24 hour  Intake 5516.33 ml  Output    625 ml  Net 4891.33 ml    Exam:   General:  Pt is more alert this AM, follows commands appropriately, not in acute distress  Cardiovascular: Regular rhythm, tachycardic, S1/S2, no rubs, no gallops  Respiratory: Rhonchi at bases, diminished air movement and poor inspiratory effort   Abdomen: Soft, non tender, non distended, bowel sounds present, no guarding  Extremities: No edema, pulses DP and PT palpable bilaterally  Data Reviewed: Basic Metabolic Panel:  Recent Labs Lab 06/21/14 1826 06/22/14 0450 06/23/14 0435 06/24/14 0515 06/25/14 0504  NA 140 140 137 139 143  K 4.3 2.9* 3.7 3.8 3.6*  CL 99 101 99 101 104  CO2 23 24 20 20 25   GLUCOSE 106* 97 125* 140* 156*  BUN 28* 19 14 25* 33*  CREATININE 0.93 0.81 0.73 0.75 0.83  CALCIUM 10.2 9.2 9.8 9.7 10.0  MG  --  2.2  --   --   --   PHOS  --  2.5  --   --   --    Liver Function Tests:  Recent Labs Lab 06/21/14 1826 06/22/14 0450  AST 34 20  ALT 22 20  ALKPHOS 69 68  BILITOT 1.0 1.0  PROT 8.9* 8.0  ALBUMIN 4.7 4.3   CBC:  Recent Labs Lab 06/20/14 1020 06/21/14 1826  06/22/14 0450 06/23/14 0435 06/24/14 0515 06/24/14 1147 06/25/14 0504  WBC 8.3 10.8* 8.6 10.3 23.9* 18.9* 24.2*  NEUTROABS 5.6 7.5  --   --   --   --   --   HGB 15.1 17.1* 15.4 16.6 16.2 14.9 15.2  HCT 41.9 47.1 43.0 46.1 44.5 42.4 44.0  MCV 86.2 87.4 86.9 87.1 87.8 87.1 89.1  PLT 236 233 203 217 270 275 264   CBG:  Recent Labs Lab 06/24/14 1633 06/24/14 2016 06/25/14 0006 06/25/14 0342 06/25/14 0730  GLUCAP 143* 146* 145* 134* 155*   Scheduled Meds: . allopurinol  200 mg Oral Daily  . ceFEPime (MAXIPIME) IV  1 g Intravenous Q8H  . docusate sodium  100 mg Oral BID  . DULoxetine  90 mg Oral Daily  . enoxaparin (LOVENOX) injection  40 mg Subcutaneous Q24H  . feeding supplement (RESOURCE BREEZE)  1 Container Oral BID PC  . hydrALAZINE  10 mg Intravenous 4 times per day  . insulin aspart  0-9 Units Subcutaneous 6 times per day  . methylphenidate  54 mg Oral Daily  . metoCLOPramide (REGLAN) injection  5 mg Intravenous 4 times per day  . ondansetron  4 mg Oral Q12H  . pantoprazole (PROTONIX) IV  40 mg Intravenous Q12H  . polyethylene glycol  17 g Oral BID  . pregabalin  200 mg Oral BID  . senna-docusate  1 tablet Oral BID  . vancomycin  1,000 mg Intravenous Q8H   Continuous Infusions: . sodium chloride 30 mL/hr at 06/24/14 1134

## 2014-06-26 DIAGNOSIS — R112 Nausea with vomiting, unspecified: Secondary | ICD-10-CM | POA: Insufficient documentation

## 2014-06-26 LAB — GLUCOSE, CAPILLARY
GLUCOSE-CAPILLARY: 129 mg/dL — AB (ref 70–99)
GLUCOSE-CAPILLARY: 155 mg/dL — AB (ref 70–99)
GLUCOSE-CAPILLARY: 122 mg/dL — AB (ref 70–99)
Glucose-Capillary: 118 mg/dL — ABNORMAL HIGH (ref 70–99)
Glucose-Capillary: 132 mg/dL — ABNORMAL HIGH (ref 70–99)
Glucose-Capillary: 170 mg/dL — ABNORMAL HIGH (ref 70–99)

## 2014-06-26 LAB — BASIC METABOLIC PANEL
ANION GAP: 15 (ref 5–15)
BUN: 33 mg/dL — AB (ref 6–23)
CHLORIDE: 106 meq/L (ref 96–112)
CO2: 23 mEq/L (ref 19–32)
Calcium: 9.7 mg/dL (ref 8.4–10.5)
Creatinine, Ser: 0.7 mg/dL (ref 0.50–1.35)
GFR calc Af Amer: >90 mL/min (ref >90)
GFR calc non Af Amer: >90 mL/min (ref >90)
Glucose, Bld: 145 mg/dL — ABNORMAL HIGH (ref 70–99)
POTASSIUM: 4 meq/L (ref 3.7–5.3)
Sodium: 144 mEq/L (ref 137–147)

## 2014-06-26 LAB — LEGIONELLA ANTIGEN, URINE

## 2014-06-26 LAB — CBC
HEMATOCRIT: 42.3 % (ref 39.0–52.0)
HEMOGLOBIN: 14.6 g/dL (ref 13.0–17.0)
MCH: 31 pg (ref 26.0–34.0)
MCHC: 34.5 g/dL (ref 30.0–36.0)
MCV: 89.8 fL (ref 78.0–100.0)
Platelets: 244 10*3/uL (ref 150–400)
RBC: 4.71 MIL/uL (ref 4.22–5.81)
RDW: 13.9 % (ref 11.5–15.5)
WBC: 15 10*3/uL — AB (ref 4.0–10.5)

## 2014-06-26 LAB — VANCOMYCIN, TROUGH: Vancomycin Tr: 16.7 ug/mL (ref 10.0–20.0)

## 2014-06-26 MED ORDER — CLONAZEPAM 0.5 MG PO TABS
0.5000 mg | ORAL_TABLET | Freq: Once | ORAL | Status: AC
  Administered 2014-06-26: 0.5 mg via ORAL
  Filled 2014-06-26: qty 1

## 2014-06-26 MED ORDER — ENSURE COMPLETE PO LIQD
237.0000 mL | Freq: Two times a day (BID) | ORAL | Status: DC
  Administered 2014-06-28 (×2): 237 mL via ORAL

## 2014-06-26 MED ORDER — PANTOPRAZOLE SODIUM 40 MG IV SOLR: 40.0000 mg | INTRAVENOUS | Status: DC

## 2014-06-26 NOTE — Progress Notes (Signed)
Clinical Social Work  CSW went to room and provided bed offers to patient. Patient's partner was not in room so patient asked CSW to call partner and give bed offers as well. CSW explained that SNF decision would need to be made tomorrow so that insurance authorization could be obtained. CSW will continue to follow.  [place], [place] [phone]

## 2014-06-26 NOTE — Progress Notes (Signed)
Physical Therapy Treatment Patient Details Name: Frank [name] MRN: [id number] DOB: Jul 30, 1956 Today's Date: 06/26/2014    History of Present Illness 58 yo Male adm 06/21/14 through [facility] ED with progressive weakness, nausea,  vomiting, poor oral intake and  history of L lateral malleolus fracturea and limited mobility since  (followed by Dr. Charlann Boxer at [facility])  PMHx: chronic back pain, frequent falls at home. This has been associated with poor mobility, inability to bear weight, occasional LE numbness and tingling.      PT Comments    Pt up to chair with 3 person assist per pt--placed maxi-move pad under pt in chair for safe return to bed; Pt continues to demo decr coordination--ataxic  Movements all extremities, decr trunk control, decr motor planning; Pt will benefit from post acute rehab  Follow Up Recommendations  SNF;Supervision/Assistance - 24 hour     Equipment Recommendations  None recommended by PT    Recommendations for Other Services       Precautions / Restrictions Precautions Precautions: Back Precaution Comments: chronic back pain Restrictions Other Position/Activity Restrictions: pt has h/o R ankle fx:  per Dr Thompson Caul note on 9/8 he walked into office on his own.  Pt reports he has been using a walker at home due to weakness    Mobility  Bed Mobility               General bed mobility comments: pt in chair  Transfers                    Ambulation/Gait                 Stairs            Wheelchair Mobility    Modified Rankin (Stroke Patients Only)       Balance Overall balance assessment: Needs assistance;History of Falls Sitting-balance support: Single extremity supported;Bilateral upper extremity supported;No upper extremity supported;Feet supported Sitting balance-Leahy Scale: Poor Sitting balance - Comments: reciprocal scooting in chair fwd and back with +2 mod assist; pt continues to have difficulty maintaining  midline when in unsupported sitting; max+2 to wt shift to place maximove pad under pt; Postural control: Other (comment) (variable)     Standing balance comment: pt unable to come to stand due to golobal weakness                    Cognition   Behavior During Therapy: Flat affect Overall Cognitive Status: Impaired/Different from baseline Area of Impairment: Attention;Following commands;Safety/judgement;Awareness;Problem solving   Current Attention Level: Sustained   Following Commands: Follows one step commands inconsistently Safety/Judgement: Decreased awareness of safety;Decreased awareness of deficits   Problem Solving: Slow processing;Decreased initiation;Difficulty sequencing;Requires verbal cues;Requires tactile cues General Comments: pt blinks eyes frequently, ? if pt is having visual problems as well; he reports difficulty focusing    Exercises General Exercises - Lower Extremity Ankle Circles/Pumps: AROM;Both;10 reps Long Arc Quad: AROM;10 reps;Strengthening;Seated Heel Raises: AROM;5 reps;Both;Seated    General Comments        Pertinent Vitals/Pain Pain Assessment: No/denies pain    Home Living                      Prior Function            PT Goals (current goals can now be found in the care plan section) Acute Rehab PT Goals Patient Stated Goal: to get stronger PT Goal Formulation: With patient/family Time For Goal  Achievement: 07/07/14 Potential to Achieve Goals: Fair Progress towards PT goals: Progressing toward goals (slowly)    Frequency  Min 3X/week    PT Plan Current plan remains appropriate    Co-evaluation             End of Session Equipment Utilized During Treatment: Gait belt Activity Tolerance: Patient tolerated treatment well Patient left: in chair;with call bell/phone within reach;with family/visitor present     Time: 1126-1201 PT Time Calculation (min) (ACUTE ONLY): 35 min  Charges:  $Therapeutic  Activity: 23-37 mins                    G Codes:      Bliss Behnke 07-12-14, 12:22 PM

## 2014-06-26 NOTE — Progress Notes (Signed)
ANTIBIOTIC CONSULT NOTE -   Pharmacy Consult for cefepime, vancomycin Indication: HCAP  Allergies  Allergen Reactions  . Codeine     REACTION: Itching  . Metformin And Related     GI upset, diarrhea   Patient Measurements: Height: 5\' 9"  (175.3 cm) Weight: 195 lb (88.451 kg) IBW/kg (Calculated) : 70.7  Vital Signs: Temp: 97.4 F (36.3 C) (11/16 0432) Temp Source: Oral (11/16 0432) BP: 185/90 mmHg (11/16 0432) Pulse Rate: 120 (11/16 0432) Intake/Output from previous day: 11/15 0701 - 11/16 0700 In: 1493.5 [I.V.:743.5; IV Piggyback:750] Out: 1600 [ZHGDJ:2426] Intake/Output from this shift:    Labs:  Recent Labs  06/24/14 0515 06/24/14 1147 06/25/14 0504 06/26/14 0417  WBC 23.9* 18.9* 24.2* 15.0*  HGB 16.2 14.9 15.2 14.6  PLT 270 275 264 244  CREATININE 0.75  --  0.83 0.70   Estimated Creatinine Clearance: 108.1 mL/min (by C-G formula based on Cr of 0.7).  Recent Labs  06/26/14 0417  VANCOTROUGH 16.7    Microbiology: Recent Results (from the past 720 hour(s))  Culture, Urine     Status: None   Collection Time: 06/24/14 12:44 PM  Result Value Ref Range Status   Specimen Description URINE, CATHETERIZED  Final   Special Requests NONE  Final   Culture  Setup Time   Final    06/24/2014 20:47 Performed at [place] Performed at [facility]   Final   Culture NO GROWTH Performed at [facility]   Final   Report Status 06/25/2014 FINAL  Final   Medications:  Scheduled:  . allopurinol  200 mg Oral Daily  . bisacodyl  10 mg Rectal BID  . ceFEPime (MAXIPIME) IV  1 g Intravenous Q8H  . DULoxetine  90 mg Oral Daily  . enoxaparin (LOVENOX) injection  40 mg Subcutaneous Q24H  . feeding supplement (RESOURCE BREEZE)  1 Container Oral BID PC  . hydrALAZINE  10 mg Intravenous 4 times per day  . insulin aspart  0-9 Units Subcutaneous 6 times per day  . methylphenidate  54 mg Oral Daily  . metoCLOPramide  (REGLAN) injection  5 mg Intravenous 4 times per day  . ondansetron  4 mg Oral Q12H  . pantoprazole (PROTONIX) IV  40 mg Intravenous Q12H  . polyethylene glycol  17 g Oral BID  . pregabalin  200 mg Oral BID  . sennosides  5 mL Oral BID  . vancomycin  1,000 mg Intravenous Q8H   Assessment: 58 yo with hx ankle fx, chronic back pain on multiple narcotics, frequent falls presented to ER with progressive weakness, N/V and poor oral intake on 11/11. Patient now developing PNA to start vancomycin and cefepime for treatment of HCAP  11/14 >> cefepime >> 11/14 >> vancomycin >>  Tmax: afebrile WBCs: 15K Renal: SCr 0.70, clearance wnl  11/16: Vanc trough 16.7 mcg/ml on 1gm q8hr- level in range, Day 3/8 abx  Goal of Therapy:  Vancomycin trough level 15-20 mcg/ml  Plan:   Continue Vancomycin 1g IV q8  No change Cefepime 1g IV q8  Minda Ditto PharmD Pager [phone] 06/26/2014, 11:37 AM

## 2014-06-26 NOTE — Progress Notes (Signed)
Occupational Therapy Treatment Patient Details Name: Frank [name] MRN: [id number] DOB: 04/16/1956 Today's Date: 06/26/2014    History of present illness 58 yo Male adm 06/21/14 through [facility] ED with progressive weakness, nausea,  vomiting, poor oral intake and  history of L lateral malleolus fracturea and limited mobility since  (followed by Dr. Charlann Boxer at [facility])  PMHx: chronic back pain, frequent falls at home. This has been associated with poor mobility, inability to bear weight, occasional LE numbness and tingling.     OT comments  Pt having much difficulty with UB bathing task from supported sitting in chair. He frequently drops objects and displays very ataxic movements. He began to get sleepy during session and also reported feeling fatigued and requested to stop bathing task. Pt also requesting to return to bed. Informed nursing.    Follow Up Recommendations  SNF;Supervision/Assistance - 24 hour    Equipment Recommendations  3 in 1 bedside comode    Recommendations for Other Services      Precautions / Restrictions Precautions Precautions: Back Precaution Comments: chronic back pain Restrictions Other Position/Activity Restrictions: pt has h/o R ankle fx:  per Dr Thompson Caul note on 9/8 he walked into office on his own.  Pt reports he has been using a walker at home due to weakness       Mobility Bed Mobility               General bed mobility comments: pt in chair  Transfers                      Balance Pt with decreased trunk control even in supported sitting and noted to lose balance to L in chair when using UEs in bathing task.                              ADL Overall ADL's : Needs assistance/impaired         Upper Body Bathing: Maximal assistance;Sitting Upper Body Bathing Details (indicate cue type and reason): supported sitting. see below                           General ADL Comments: Pt frequently drops washcloth  when held in either hand. He is very ataxic with movements to perform bathing and therefore has difficulty with washing thoroughly. Used hand over hand techniques to held guide the washcloth under arms, etc. Pt reports starting to feel drowsy and requesting to stop task. Helped pt to finish up UB bathing and don new gown. Pt also requesting to return to bed and PT recommending lift. Informed nursing at desk to let pt's nurse know as this OT was not able to reach nurse via phone. She states she will let his nurse know.  Pt attempted to hold body wash in R hand to squeeze out soap but dropped the bottle. Hand over hand assist to help dispense soap onto washcloth. Pt attempting during session to hold to armrest of chair to reposition and noted hand to slide off chair. Pt losing balance to the left in chair.       Vision                 Additional Comments: not able to assess. pt sleepy.    Perception     Praxis      Cognition   Behavior During Therapy: Flat affect Overall  Cognitive Status: Impaired/Different from baseline Area of Impairment: Attention;Following commands;Problem solving   Current Attention Level: Sustained    Following Commands: Follows one step commands inconsistently Safety/Judgement: Decreased awareness of safety;Decreased awareness of deficits   Problem Solving: Slow processing;Decreased initiation;Difficulty sequencing;Requires verbal cues;Requires tactile cues General Comments: pt blinks eyes frequently, ? if pt is having visual problems as well; he reports difficulty focusing    Extremity/Trunk Assessment                  Shoulder Instructions       General Comments      Pertinent Vitals/ Pain       Pain Assessment: No/denies pain  Home Living                                          Prior Functioning/Environment              Frequency Min 2X/week     Progress Toward Goals  OT Goals(current goals can now be found  in the care plan section)  Progress towards OT goals: Not progressing toward goals - comment (sleepy, pt requsting to stop, fatigued.)  Acute Rehab OT Goals Patient Stated Goal: to get stronger  Plan Discharge plan remains appropriate    Co-evaluation                 End of Session     Activity Tolerance Patient limited by fatigue   Patient Left in chair;with family/visitor present   Nurse Communication          Time: 1210-1230 OT Time Calculation (min): 20 min  Charges: OT General Charges $OT Visit: 1 Procedure OT Treatments $Self Care/Home Management : 8-22 mins  Jules Schick  [phone] 06/26/2014, 1:07 PM

## 2014-06-26 NOTE — Progress Notes (Addendum)
Clinical Social Work Department CLINICAL SOCIAL WORK PLACEMENT NOTE 06/26/2014  Patient:  Frank [name], Frank [name]  Account Number:  [id number] [place] date:  06/21/2014  Clinical Social Worker:  Sindy Messing, LCSW  Date/time:  06/26/2014 12:00 N  Clinical Social Work is seeking post-discharge placement for this patient at the following level of care:   [place]   (*CSW will update this form in Epic as items are completed)   06/26/2014  Patient/family provided with [place] Department of Clinical Social Work's list of facilities offering this level of care within the geographic area requested by the patient (or if unable, by the patient's family).  06/26/2014  Patient/family informed of their freedom to choose among providers that offer the needed level of care, that participate in Medicare, Medicaid or managed care program needed by the patient, have an available bed and are willing to accept the patient.  06/26/2014  Patient/family informed of [facility]' ownership interest in [facility], as well as of the fact that they are under no obligation to receive care at this facility.  PASARR submitted to EDS on 06/26/2014 PASARR number received on 06/26/2014  FL2 transmitted to all facilities in geographic area requested by pt/family on  06/26/2014 FL2 transmitted to all facilities within larger geographic area on   Patient informed that his/her managed care company has contracts with or will negotiate with  certain facilities, including the following:     Patient/family informed of bed offers received:  06/27/14 Patient chooses bed at [facility] Physician recommends and patient chooses bed at    Patient to be transferred [facility]  on  06/28/14 Patient to be transferred to facility by [facility] Patient and family notified of transfer on 06/28/14 Name of family member notified:  Daphene Calamity  The following physician request were entered in  Epic:   Additional Comments:

## 2014-06-26 NOTE — Progress Notes (Signed)
Clinical Social Work  Patient was discussed during progression meeting and MD reports she is concerned about patient returning home and spoke with patient about reconsidering SNF. CSW met with patient and partner [name]) at bedside. Patient sitting in chair and agreeable to assessment. Patient reports he should have never come to the hospital and wishes he was back at home. Patient refusing SNF and reports he only wants to go home where he feels comfortable. Partner involved in session and encouraged patient to reconsider options. Partner reports that patient fell several times prior to being admitted and that he is continuing to work so patient would be at home during the day. Partner spoke with patient about considering placement at [facility] or [facility] for [place] stay prior to returning home. Patient reports that he will consider these SNFs if they have a private room available. CSW explained that insurance would need to approve stay.  RN reported concerns about depression and encouraged MD to consult psych MD to evaluate patient. CSW spoke with patient about any MH diagnosis. Patient reports his mother passed away about 10 years ago and that he was diagnosed with depression and neurologist prescribed Cymbalta. Patient reports that medication has been helpful and he feels it is beneficial but depression has increased due to environmental stressors. Patient reports no current follow up with psychiatrist or therapist but that he saw a therapist after mother passed away. Partner reports that patient's previous partner passed away from AIDS recently and patient saw a therapist in order to discuss feelings as well. Patient broke his ankle in July and has been bed bound pretty much since that incident. Patient reports he feels dependent on others and feels that depression has increased due to lack of mobility as well. Patient reports that he has never felt suicidal and denies any previous attempts. Patient has  never been hospitalized and denies any substance use. Patient reports that he has never hallucinated in the past but was unsure if he was hallucinating last night in the hospital. Patient reports he informed RN who provided medication that helped him relax and sleep but then he experienced nightmares.  Patient and partner engaged in assessment and thanked CSW for information. Patient agreeable for CSW to continue to follow to provide support and to assist with SNF placement. Patient agreeable for CSW to share information with partner via phone if partner is not in room.  CSW will continue to follow.  [place], [place] [phone]

## 2014-06-26 NOTE — Progress Notes (Signed)
Patient ID: Frank [name], male   DOB: 1955/11/16, 58 y.o.   MRN: [id number]  [facility] PROGRESS NOTE  Frank [name] VQQ:[id number] DOB: 1955-08-15 DOA: 06/21/2014 PCP: Cathlean Cower, MD  Brief narrative: 58 y.o. Male with history of ankle fracture and follows with sports medicine, chronic back pain (on multiple analgesics including Percocet, Nucynta, lyrica, mobic), frequent falls at home, presented to [facility] ED with main concern of progressive weakness, nausea, non bloody vomiting, poor oral intake. He was sent from PCP office to the ED for further evaluation. In addition, pt reported mid area back pain, constant and throbbing, 10/10 in severity, radiating to the sides and to the front epigastric area, with no specific alleviating factors. This has been associated with poor mobility, inability to bear weight, occasional LE numbness and tingling.   Overall, very slow clinical recovery, more dysphagia reports 11/15 and SLP requested. GI re consulted with plan to do EGD 06/27/2014.   Assessment and Plan:    Active Problems:  Nausea, vomiting, poor oral intake - possibly related to multiple narcotic medications, ? Gastroparesis - placed on Reglan 5 mg IV Q6 hours, pt still reports generalized pain, no episodes of vomiting recorder over the past 48 hours  - minimize narcotic use as possible  - pt actually feeling better this AM and denies vomiting , he is more alert compared to yesterday  - no aortic dissection or aneurysm noted on recent Ct angio abd 11/07 - appreciate GI input  Acute hypoxic respiratory failure - secondary to HCAP - ABX started 11/14, will continue for now and transition to oral ABX when able to swallow  - provide oxygen via [place]   Dysphagia - chronic in nature in pt with known history of esophageal stricture  - pt is at high risk of aspiration  - SLP requested and GI re consulted, plan for EGD in AM 11/17  Leukocytosis - possibly related to developing PNA  noted on CXR 11/14 - pt started on Vancomycin and Maxipime 11/14, will continue for now as pt is doing better  - WBC is trending down and pt remains afebrile over the past 24 hours  - repeat CBC in AM  Diabetes mellitus, with complications of neuropathies  - keep on SSI for now until oral intake improves - readjust the regimen as indicated   Hypokalemia - supplemented and WNL this AM - repeat BMP in AM  Accelerated HTN - pt refusing PO hydralazine due to dysphagia  - continue Hydralazine IV scheduled and as needed for now - transition to oral regimen when able to take PO   Chronic back pain  - MRI lumbar spine with mild lumbar spine degenerative changes, no focal disc herniation, no definite nerve root encroachment - MRI thoracic spine with left paracentral disc extrusion at T7-8, no abnormal cord signal, no compression deformity or other a worrisome finding - PT/OT done, recommendation is to proceed with SNF placement but pt refusing to go to SNF - will discuss recommendations with family, pt is unsafe for d/c home  Urinary retention - foley placed   Generalized weakness - appears to be secondary to progressive deconditioning - PT/OT evaluation requested    Severe PCM - minimal oral intake since admission - possibly related to acute illness with underlying dysphagia of unclear etiology - EGD in AM and will possibly be able to advance diet    Acute functional quadriplegia - still poor mobility - move to chair at least Q shift   DVT prophylaxis  Lovenox SQ while pt is in hospital  Code Status: DNR Family Communication: No family at bedside  Disposition Plan: Remains inpatient   IV Access:    Peripheral IV Procedures and diagnostic studies:    Dg Abd 1 View 06/21/2014 Moderate stool burden. No acute findings.  Mr Thoracic Spine Wo Contrast 11/12/2015L paracentral disc extrusion at T7-8. Effacement of ant subarachnoid space and mild cord  flattening. No abnormal cord signal. No compression deformity or other a worrisome finding.  Mr Lumbar Spine Wo Contrast 06/20/2014 Transitional lumbosacral anatomy. L5 sacralized, lumbar spine degenerative changes, greatest at L3-4 where there is annular disc bulging, facet and ligamentous hypertrophy contributing to mild lateral recess and foraminal stenosis bilaterally. No focal disc herniation or definite nerve root encroachment.   Dg Chest Port 1 View 06/24/2014 Left lower lung airspace opacities suspicious for pneumonia or aspiration. Low volume film with mild bibasilar atelectasis.  Medical Consultants:    GI Other Consultants:    Physical therapy   OT Anti-Infectives:    Vancomycin 11/14 -->  Maxipime 11/14 -->  Faye Ramsay, MD  [facility] Pager [phone]  If 7PM-7AM, please contact night-coverage [email or web address] Password [email or web address] 06/26/2014, 3:05 PM   LOS: 5 days   HPI/Subjective: No events overnight.   Objective: Filed Vitals:   06/25/14 2048 06/26/14 0007 06/26/14 0432 06/26/14 1449  BP: 159/78 184/106 185/90 183/82  Pulse: 94  120 115  Temp: 97.6 F (36.4 C)  97.4 F (36.3 C) 97.9 F (36.6 C)  TempSrc: Oral  Oral Oral  Resp: 16  18 18   Height:      Weight:      SpO2: 95%  94% 94%    Intake/Output Summary (Last 24 hours) at 06/26/14 1505 Last data filed at 06/26/14 1353  Gross per 24 hour  Intake 1553.5 ml  Output   1250 ml  Net  303.5 ml    Exam:   General:  Pt is alert, follows commands appropriately, not in acute distress  Cardiovascular: Regular rhythm, tachycardic, S1/S2, no rubs, no gallops  Respiratory: Clear to auscultation bilaterally, no wheezing, diminished breath sounds at bases   Abdomen: Soft, non tender, non distended, bowel sounds present, no guarding  Extremities: No edema, pulses DP and PT palpable bilaterally  Data Reviewed: Basic Metabolic Panel:  Recent Labs Lab 06/22/14 0450 06/23/14 0435  06/24/14 0515 06/25/14 0504 06/26/14 0417  NA 140 137 139 143 144  K 2.9* 3.7 3.8 3.6* 4.0  CL 101 99 101 104 106  CO2 24 20 20 25 23   GLUCOSE 97 125* 140* 156* 145*  BUN 19 14 25* 33* 33*  CREATININE 0.81 0.73 0.75 0.83 0.70  CALCIUM 9.2 9.8 9.7 10.0 9.7  MG 2.2  --   --   --   --   PHOS 2.5  --   --   --   --    Liver Function Tests:  Recent Labs Lab 06/21/14 1826 06/22/14 0450  AST 34 20  ALT 22 20  ALKPHOS 69 68  BILITOT 1.0 1.0  PROT 8.9* 8.0  ALBUMIN 4.7 4.3   CBC:  Recent Labs Lab 06/20/14 1020 06/21/14 1826  06/23/14 0435 06/24/14 0515 06/24/14 1147 06/25/14 0504 06/26/14 0417  WBC 8.3 10.8*  < > 10.3 23.9* 18.9* 24.2* 15.0*  NEUTROABS 5.6 7.5  --   --   --   --   --   --   HGB 15.1 17.1*  < > 16.6 16.2 14.9 15.2 14.6  HCT 41.9 47.1  < > 46.1 44.5 42.4 44.0 42.3  MCV 86.2 87.4  < > 87.1 87.8 87.1 89.1 89.8  PLT 236 233  < > 217 270 275 264 244  < > = values in this interval not displayed.  CBG:  Recent Labs Lab 06/25/14 1638 06/25/14 1955 06/25/14 2343 06/26/14 0325 06/26/14 0749  GLUCAP 150* 150* 126* 118* 129*    Recent Results (from the past 240 hour(s))  Culture, Urine     Status: None   Collection Time: 06/24/14 12:44 PM  Result Value Ref Range Status   Specimen Description URINE, CATHETERIZED  Final   Special Requests NONE  Final   Culture  Setup Time   Final    06/24/2014 20:47 Performed at [place] Performed at [facility]   Final   Culture NO GROWTH Performed at [facility]   Final   Report Status 06/25/2014 FINAL  Final     Scheduled Meds: . allopurinol  200 mg Oral Daily  . bisacodyl  10 mg Rectal BID  . ceFEPime (MAXIPIME) IV  1 g Intravenous Q8H  . DULoxetine  90 mg Oral Daily  . feeding supplement (ENSURE COMPLETE)  237 mL Oral BID BM  . hydrALAZINE  10 mg Intravenous 4 times per day  . insulin aspart  0-9 Units Subcutaneous 6 times per day  .  methylphenidate  54 mg Oral Daily  . metoCLOPramide (REGLAN) injection  5 mg Intravenous 4 times per day  . ondansetron  4 mg Oral Q12H  . pantoprazole (PROTONIX) IV  40 mg Intravenous Q12H  . polyethylene glycol  17 g Oral BID  . pregabalin  200 mg Oral BID  . sennosides  5 mL Oral BID  . vancomycin  1,000 mg Intravenous Q8H   Continuous Infusions: . sodium chloride 30 mL/hr at 06/25/14 1740

## 2014-06-26 NOTE — Progress Notes (Signed)
Patient ID: Frank [name], male   DOB: 1955/10/26, 58 y.o.   MRN: [id number]  [facility] Progress Note  Subjective: Asked to see this pt again by speech path. Pt was seen on Friday for nausea and vomiting but at that time was not c/o dysphagia. He has multiple issues- nausea and vomiting resolved but  Now says he was having a lot of reflux at home and that dysphagia has recurred over  past few months- he is unable to be specific if solid or liquid, says it's just like when he had stricture  Previously in 2009. He has also had coughing /choking and is being treated for pneumonia.  Objective:  Vital signs in last 24 hours: Temp:  [97.4 F (36.3 C)-98.2 F (36.8 C)] 97.4 F (36.3 C) (11/16 0432) Pulse Rate:  [90-120] 120 (11/16 0432) Resp:  [16-18] 18 (11/16 0432) BP: (110-198)/(78-106) 185/90 mmHg (11/16 0432) SpO2:  [93 %-95 %] 94 % (11/16 0432) Last BM Date: 06/16/14 General:   Alert,  Well-developed, chronically ill appearing WM   in NAD Heart:  Regular rate and rhythm; no murmurs Pulm;few scattered rhonchi Abdomen:  Soft, nontender and nondistended. Normal bowel sounds, without guarding, and without rebound.   Extremities:  Without edema. Neurologic:  Alert and  oriented x4;  Upper extremities somewhat floppy Psych:  Alert and cooperative. Normal mood and affect.  Intake/Output from previous day: 11/15 0701 - 11/16 0700 In: 1493.5 [I.V.:743.5; IV Piggyback:750] Out: 1600 [Urine:1600] Intake/Output this shift: Total I/O In: 60 [P.O.:60] Out: 400 [Urine:400]  Lab Results:  Recent Labs  06/24/14 1147 06/25/14 0504 06/26/14 0417  WBC 18.9* 24.2* 15.0*  HGB 14.9 15.2 14.6  HCT 42.4 44.0 42.3  PLT 275 264 244   BMET  Recent Labs  06/24/14 0515 06/25/14 0504 06/26/14 0417  NA 139 143 144  K 3.8 3.6* 4.0  CL 101 104 106  CO2 20 25 23   GLUCOSE 140* 156* 145*  BUN 25* 33* 33*  CREATININE 0.75 0.83 0.70  CALCIUM 9.7 10.0 9.7     Assessment /  Plan: #1  58 yo male with nausea/vomiting resolved ? Gastroparesis with chronic narcotics, now with recurrent dysphagia- hx of esophageal stricture with dilation 2009/kaplan. R/o recurrent stricture/ reflux esophagitis #2 peripheral neuropathy #3 IDDM #4 chronic back pain #5HTN #6Pneumonia  Plan- will schedule for EGD with possible dilation with Dr. Olevia Perches in am Tuesday If this is negative he will need modified swallowing study with speech path Hold Lovenox Add IV PPI Active Problems:   Diabetes   Essential hypertension   Nausea and vomiting   Chronic back pain greater than 3 months duration   Dehydration   Generalized weakness     LOS: 5 days   Amy Esterwood  06/26/2014, 2:47 PM   Attending MD note:   I have taken a history, and reviewed the chart. I agree with the Advanced Practitioner's impression and recommendations.   [facility] Pager # [phone]

## 2014-06-26 NOTE — Evaluation (Addendum)
Clinical/Bedside Swallow Evaluation Patient Details  Name: Frank [name] MRN: [id number] Date of Birth: 04/26/1956  Today's Date: 06/26/2014 Time: 9562-1308 SLP Time Calculation (min) (ACUTE ONLY): 46 min  Past Medical History:  Past Medical History  Diagnosis Date  . COLONIC POLYPS 02/01/2008  . DIABETES MELLITUS, TYPE II 05/20/2010  . HYPERLIPIDEMIA 10/09/2008  . ANXIETY DEPRESSION 02/01/2008  . ERECTILE DYSFUNCTION 10/09/2008  . ADD 10/09/2008  . SLEEP APNEA, OBSTRUCTIVE 02/01/2008  . MORTON'S NEUROMA 05/20/2010  . PERIPHERAL NEUROPATHY 05/20/2010  . Other specified forms of hearing loss 06/27/2009  . HYPERTENSION 10/09/2008  . HEMORRHOIDS 02/01/2008  . ALLERGIC RHINITIS 10/09/2008  . Stricture and stenosis of esophagus 02/02/2008  . GERD 02/01/2008  . HIATAL HERNIA 02/01/2008  . ERECTILE DYSFUNCTION, ORGANIC 05/20/2010  . WRIST PAIN, LEFT 12/05/2009  . FOOT PAIN, LEFT 05/20/2010  . PERIPHERAL EDEMA 05/20/2010  . DYSPNEA 03/12/2010  . Abdominal pain, unspecified site 01/19/2009  . Abdominal pain, left lower quadrant 06/06/2010  . Type II or unspecified type diabetes mellitus without mention of complication, uncontrolled 11/14/2010   Past Surgical History:  Past Surgical History  Procedure Laterality Date  . Carpal tunnel release    . Rotator cuff repair    . Esophageal dilation  july 2009  . Eye surgery     HPI:  58 yo male adm to [facility] with nausea/vomiting.  Pt has been seen by GI who are treated pt for gastroparesis and ? contribution of narcotic use.  Patient also with h/o fall in end of July and had sustained ankle fracture, followed by sports medicine.  He has had diminished ambulation ever since stating due to pain per MD note. Pt has been taking prescribed opioids for pain management (takes percocet, Nucynta, lyrica, cymbalta, mobic, all of which can treat pain) per MD note.  Swallow evaluation ordered for speech by MD.     Assessment / Plan / Recommendation Clinical Impression  Pt  reports current symptom to be consistent with prior to previous dilatation done in 2009.  Significant other [name] agrees to pt's statements.  Noted facial asymmetry on right which [name] states is not normal.  Question if symptoms may be neurological also given pt CN finding and ? motor deficits.   Pt with delayed cough and expectoration of secretions mixed with boluses he consumed.  Mildly delayed oral transiting noted with pt benefiting from verbal cue to swallow.    Educated pt/Mike to aspiration precautions and possible mitigation strategies.  SLP relayed message to GI that pt reports need for dilatation.    SLP will follow up, if GI does not plan intevention will plan MBS.  If GI intervention warranted will follow up after to determine if mitigated symptoms.         Aspiration Risk  Moderate (suspect primarily esophageal)    Diet Recommendation Thin liquid (full liquids)   Liquid Administration via: Cup;Straw Medication Administration: Crushed with puree Supervision: Patient able to self feed Compensations: Slow rate;Small sips/bites;Follow solids with liquid Postural Changes and/or Swallow Maneuvers: Seated upright 90 degrees;Upright 30-60 min after meal    Other  Recommendations Oral Care Recommendations: Oral care BID   Follow Up Recommendations   (tbd)    Frequency and Duration    1 week   Pertinent Vitals/Pain Afebrile, decreased     Swallow Study Prior Functional Status   [name] reports pt with no intake for one week due to n/v.  Also reports some h/o dysphagia prior to dilatation.  General Date of Onset: 06/26/14 HPI: 58 yo male adm to [facility] with nausea/vomiting.  Pt has been seen by GI who are treated pt for gastroparesis and ? contribution of narcotic use.  Patient also with h/o fall in end of July and had sustained ankle fracture, followed by sports medicine.  He has had diminished ambulation ever since stating due to pain per MD note. Pt has been taking prescribed  opioids for pain management (takes percocet, Nucynta, lyrica, cymbalta, mobic, all of which can treat pain) per MD note.  Swallow evaluation ordered for speech by MD.   Type of Study: Bedside swallow evaluation Previous Swallow Assessment: EGD 2009 with dilatation of stricture, esophagram 2007 + reflux @ EG junction, tertiary contractions, prominent cricopharyngeus, barium tablet cleared Diet Prior to this Study: Thin liquids (full liquids) Temperature Spikes Noted: No Respiratory Status: Room air History of Recent Intubation: No Behavior/Cognition: Alert;Cooperative;Pleasant mood Oral Cavity - Dentition: Adequate natural dentition Self-Feeding Abilities: Needs assist;Total assist (pt with neuropathy per significant other, ? source of motor deficits) Patient Positioning: Upright in bed Baseline Vocal Quality: Clear Volitional Cough: Weak Volitional Swallow: Unable to elicit    Oral/Motor/Sensory Function Overall Oral Motor/Sensory Function:  (generalized weakness) Facial ROM: Reduced right Facial Symmetry: Right droop Facial Strength: Reduced Velum: Within Functional Limits   Ice Chips Ice chips: Not tested   Thin Liquid Thin Liquid: Impaired Oral Phase Impairments: Reduced lingual movement/coordination;Impaired anterior to posterior transit Oral Phase Functional Implications: Prolonged oral transit Pharyngeal  Phase Impairments: Multiple swallows;Cough - Delayed    Nectar Thick Nectar Thick Liquid: Not tested   Honey Thick Honey Thick Liquid: Not tested   Puree Puree: Impaired Presentation: Spoon;Self Fed Oral Phase Impairments: Impaired anterior to posterior transit;Reduced lingual movement/coordination Oral Phase Functional Implications: Prolonged oral transit Pharyngeal Phase Impairments: Cough - Delayed   Solid   GO    Solid: Impaired Presentation: Self Fed Oral Phase Impairments: Reduced lingual movement/coordination;Impaired anterior to posterior transit Oral Phase  Functional Implications: Other (comment) (delayed oral transiting) Pharyngeal Phase Impairments: Multiple swallows;Cough - Delayed       Luanna Salk, [facility] SLP [phone]

## 2014-06-26 NOTE — Progress Notes (Signed)
NUTRITION FOLLOW UP/Consult   INTERVENTION: Chief Operating Officer. Try Ensure Complete po BID, each supplement provides 350 kcal and 13 grams of protein Full liquid diet with advancement per MD Encouraged po intake as tolerated RD to follow.  NUTRITION DIAGNOSIS: Inadequate oral intake related to nausea and vomiting as evidenced by 0% meal completion and 2.5% weight loss in the past week. progressing  Goal: Pt to meet >/= 90% of their estimated nutrition needs-not met  Monitor:  Diet advancement, PO intake/tolerance, weight trend, labs  Reason for Assessment: Malnutrition Screening Tool, score of 3  58 y.o. male  Admitting Dx: Nausea and Vomiting with Dehydration  ASSESSMENT: 58 year old male with history of colonic polyps, diabetes mellitus Type 2, anxiety depression, ADD, sleep apnea, peripheral neuropathy, hypertension, GERD, and hiatal hernia. Patient  had a fall in end of July and have sustained ankle fracture followed by sports medicine. Patient have had diminished ambulation ever since stating due to pain. His been taking prescribed opioids for pain management. He has been having worsening nausea and vomiting for past few days. Reports not able to keep anything down. Hospitalist was called for admission for not able to walk and continuous nausea and vomiting with dehydration.   11/13: Patient resting at time of visit and kept eyes closed during assessment, only answering yes or no questions. Patient had some ginger ale at bedside. Patient reports ongoing nausea today but, he has been able to keep some clear liquids down. He reports eating well before N/V started and maintaining his weight at 200 lbs. Pt has lost 5 lbs, 2.5% of his body weight, in the past week. Per nursing notes pt ate 0% of breakfast this morning and 0% of lunch.  Pt agreeable to trying Resource Breeze supplements while on the clear liquid diet.   11/16: Tolerated small amounts of full liquids this am  without vomiting. Did not like Resource. Friend reports very poor intake. Appears very week.  Preparing for walk with PT at time of visit. Labs reviewed.  Patient had been taking prescribed opioids for pain management. Seen by SLP today.  Symptoms prior to previous dilation done in 2009.  Height: Ht Readings from Last 1 Encounters:  06/21/14 '5\' 9"'  (1.753 m)    Weight: Wt Readings from Last 1 Encounters:  06/21/14 195 lb (88.451 kg)    Ideal Body Weight: 160 lbs  % Ideal Body Weight: 122%  Wt Readings from Last 10 Encounters:  06/21/14 195 lb (88.451 kg)  06/20/14 200 lb (90.719 kg)  05/19/14 200 lb (90.719 kg)  05/17/14 200 lb (90.719 kg)  04/18/14 199 lb (90.266 kg)  03/27/14 206 lb (93.441 kg)  03/10/14 200 lb (90.719 kg)  03/06/14 200 lb (90.719 kg)  01/12/14 207 lb 6 oz (94.065 kg)  12/30/13 210 lb (95.255 kg)    Usual Body Weight: 200 lbs  % Usual Body Weight: 97.5%  BMI:  Body mass index is 28.78 kg/(m^2).  Estimated Nutritional Needs: Kcal: 2100-2300 Protein: 90-105 grams Fluid: 2.3-2.6 L/day  Skin: intact  Diet Order: Diet full liquid  EDUCATION NEEDS: -No education needs identified at this time   Intake/Output Summary (Last 24 hours) at 06/26/14 1251 Last data filed at 06/26/14 0827  Gross per 24 hour  Intake 1493.5 ml  Output   1200 ml  Net  293.5 ml    Last BM: 11/6  Labs:   Recent Labs Lab 06/22/14 0450  06/24/14 0515 06/25/14 0504 06/26/14 0417  NA 140  < >  139 143 144  K 2.9*  < > 3.8 3.6* 4.0  CL 101  < > 101 104 106  CO2 24  < > '20 25 23  ' BUN 19  < > 25* 33* 33*  CREATININE 0.81  < > 0.75 0.83 0.70  CALCIUM 9.2  < > 9.7 10.0 9.7  MG 2.2  --   --   --   --   PHOS 2.5  --   --   --   --   GLUCOSE 97  < > 140* 156* 145*  < > = values in this interval not displayed.  CBG (last 3)   Recent Labs  06/25/14 2343 06/26/14 0325 06/26/14 0749  GLUCAP 126* 118* 129*    Scheduled Meds: . allopurinol  200 mg Oral Daily   . bisacodyl  10 mg Rectal BID  . ceFEPime (MAXIPIME) IV  1 g Intravenous Q8H  . DULoxetine  90 mg Oral Daily  . enoxaparin (LOVENOX) injection  40 mg Subcutaneous Q24H  . feeding supplement (RESOURCE BREEZE)  1 Container Oral BID PC  . hydrALAZINE  10 mg Intravenous 4 times per day  . insulin aspart  0-9 Units Subcutaneous 6 times per day  . methylphenidate  54 mg Oral Daily  . metoCLOPramide (REGLAN) injection  5 mg Intravenous 4 times per day  . ondansetron  4 mg Oral Q12H  . pantoprazole (PROTONIX) IV  40 mg Intravenous Q12H  . polyethylene glycol  17 g Oral BID  . pregabalin  200 mg Oral BID  . sennosides  5 mL Oral BID  . vancomycin  1,000 mg Intravenous Q8H    Continuous Infusions: . sodium chloride 30 mL/hr at 06/25/14 1740    Past Medical History  Diagnosis Date  . COLONIC POLYPS 02/01/2008  . DIABETES MELLITUS, TYPE II 05/20/2010  . HYPERLIPIDEMIA 10/09/2008  . ANXIETY DEPRESSION 02/01/2008  . ERECTILE DYSFUNCTION 10/09/2008  . ADD 10/09/2008  . SLEEP APNEA, OBSTRUCTIVE 02/01/2008  . MORTON'S NEUROMA 05/20/2010  . PERIPHERAL NEUROPATHY 05/20/2010  . Other specified forms of hearing loss 06/27/2009  . HYPERTENSION 10/09/2008  . HEMORRHOIDS 02/01/2008  . ALLERGIC RHINITIS 10/09/2008  . Stricture and stenosis of esophagus 02/02/2008  . GERD 02/01/2008  . HIATAL HERNIA 02/01/2008  . ERECTILE DYSFUNCTION, ORGANIC 05/20/2010  . WRIST PAIN, LEFT 12/05/2009  . FOOT PAIN, LEFT 05/20/2010  . PERIPHERAL EDEMA 05/20/2010  . DYSPNEA 03/12/2010  . Abdominal pain, unspecified site 01/19/2009  . Abdominal pain, left lower quadrant 06/06/2010  . Type II or unspecified type diabetes mellitus without mention of complication, uncontrolled 11/14/2010    Past Surgical History  Procedure Laterality Date  . Carpal tunnel release    . Rotator cuff repair    . Esophageal dilation  july 2009  . Eye surgery      Antonieta Iba, RD, LDN Clinical Inpatient Dietitian Pager:  [phone] Weekend and after  hours pager:  [phone]

## 2014-06-27 ENCOUNTER — Encounter (HOSPITAL_COMMUNITY): Payer: Self-pay | Admitting: *Deleted

## 2014-06-27 ENCOUNTER — Encounter (HOSPITAL_COMMUNITY)
Admission: EM | Disposition: A | Discharge status: Skilled Nursing Facility | Payer: Self-pay | Source: Home / Self Care | Attending: Internal Medicine

## 2014-06-27 DIAGNOSIS — F341 Dysthymic disorder: Secondary | ICD-10-CM

## 2014-06-27 DIAGNOSIS — K219 Gastro-esophageal reflux disease without esophagitis: Secondary | ICD-10-CM

## 2014-06-27 HISTORY — PX: ESOPHAGOGASTRODUODENOSCOPY: SHX5428

## 2014-06-27 LAB — GLUCOSE, CAPILLARY
GLUCOSE-CAPILLARY: 144 mg/dL — AB (ref 70–99)
GLUCOSE-CAPILLARY: 179 mg/dL — AB (ref 70–99)
GLUCOSE-CAPILLARY: 134 mg/dL — AB (ref 70–99)
Glucose-Capillary: 118 mg/dL — ABNORMAL HIGH (ref 70–99)
Glucose-Capillary: 108 mg/dL — ABNORMAL HIGH (ref 70–99)

## 2014-06-27 LAB — CBC
HEMATOCRIT: 40.2 % (ref 39.0–52.0)
HEMOGLOBIN: 14.3 g/dL (ref 13.0–17.0)
MCH: 31.9 pg (ref 26.0–34.0)
MCHC: 35.6 g/dL (ref 30.0–36.0)
MCV: 89.7 fL (ref 78.0–100.0)
Platelets: 246 10*3/uL (ref 150–400)
RBC: 4.48 MIL/uL (ref 4.22–5.81)
RDW: 13.8 % (ref 11.5–15.5)
WBC: 11.6 10*3/uL — ABNORMAL HIGH (ref 4.0–10.5)

## 2014-06-27 LAB — BASIC METABOLIC PANEL
Anion gap: 14 (ref 5–15)
BUN: 28 mg/dL — ABNORMAL HIGH (ref 6–23)
CALCIUM: 9.5 mg/dL (ref 8.4–10.5)
CO2: 29 mEq/L (ref 19–32)
Chloride: 100 mEq/L (ref 96–112)
Creatinine, Ser: 0.72 mg/dL (ref 0.50–1.35)
GFR calc Af Amer: >90 mL/min (ref >90)
GFR calc non Af Amer: >90 mL/min (ref >90)
GLUCOSE: 145 mg/dL — AB (ref 70–99)
POTASSIUM: 3.3 meq/L — AB (ref 3.7–5.3)
Sodium: 143 mEq/L (ref 137–147)

## 2014-06-27 SURGERY — EGD (ESOPHAGOGASTRODUODENOSCOPY)
Anesthesia: Moderate Sedation

## 2014-06-27 MED ORDER — FENTANYL CITRATE 0.05 MG/ML IJ SOLN
INTRAMUSCULAR | Status: DC | PRN
  Administered 2014-06-27 (×2): 25 ug via INTRAVENOUS

## 2014-06-27 MED ORDER — BUTAMBEN-TETRACAINE-BENZOCAINE 2-2-14 % EX AERO
INHALATION_SPRAY | CUTANEOUS | Status: DC | PRN
  Administered 2014-06-27: 2 via TOPICAL

## 2014-06-27 MED ORDER — MIDAZOLAM HCL 10 MG/2ML IJ SOLN
INTRAMUSCULAR | Status: DC | PRN
  Administered 2014-06-27 (×2): 2 mg via INTRAVENOUS

## 2014-06-27 MED ORDER — DIPHENHYDRAMINE HCL 50 MG/ML IJ SOLN
INTRAMUSCULAR | Status: AC
  Filled 2014-06-27: qty 1

## 2014-06-27 MED ORDER — MIDAZOLAM HCL 10 MG/2ML IJ SOLN
INTRAMUSCULAR | Status: AC
  Filled 2014-06-27: qty 4

## 2014-06-27 MED ORDER — METOPROLOL TARTRATE 1 MG/ML IV SOLN
5.0000 mg | Freq: Four times a day (QID) | INTRAVENOUS | Status: DC
  Administered 2014-06-27 – 2014-06-28 (×4): 5 mg via INTRAVENOUS
  Filled 2014-06-27 (×7): qty 5

## 2014-06-27 MED ORDER — FENTANYL CITRATE 0.05 MG/ML IJ SOLN
INTRAMUSCULAR | Status: AC
  Filled 2014-06-27: qty 4

## 2014-06-27 MED ORDER — HYDRALAZINE HCL 20 MG/ML IJ SOLN
INTRAMUSCULAR | Status: AC
  Filled 2014-06-27: qty 1

## 2014-06-27 NOTE — Progress Notes (Signed)
Clinical Social Work  CSW met with patient and partner at bedside who have chosen [facility] for SNF placement. CSW spoke with SNF who will begin insurance authorization. CSW will continue to follow and assist with DC planning.  [place], [place] [phone]

## 2014-06-27 NOTE — Progress Notes (Signed)
Patient ID: Frank [name], male   DOB: January 26, 1956, 58 y.o.   MRN: [id number]  [facility] PROGRESS NOTE  Hakeen Shipes NTZ:[id number] DOB: 1956-02-09 DOA: 06/21/2014 PCP: Cathlean Cower, MD   Brief narrative: 58 y.o. Male with history of ankle fracture and follows with sports medicine, chronic back pain (on multiple analgesics including Percocet, Nucynta, lyrica, mobic), frequent falls at home, presented to [facility] ED with main concern of progressive weakness, nausea, non bloody vomiting, poor oral intake. He was sent from PCP office to the ED for further evaluation. In addition, pt reported mid area back pain, constant and throbbing, 10/10 in severity, radiating to the sides and to the front epigastric area, with no specific alleviating factors. This has been associated with poor mobility, inability to bear weight, occasional LE numbness and tingling.   Overall, very slow clinical recovery, more dysphagia reports 11/15 and SLP requested. GI re consulted with plan to do EGD 06/27/2014.   Assessment and Plan:    Active Problems:  Nausea, vomiting, poor oral intake - possibly related to multiple narcotic medications, ? Gastroparesis - placed on Reglan 5 mg IV Q6 hours, pt still reports generalized pain, no episodes of vomiting recorder over the past 48 hours  - minimize narcotic use as possible  - pt actually feeling better this AM and denies vomiting , he is more alert compared to yesterday  - no aortic dissection or aneurysm noted on recent Ct angio abd 11/07 - appreciate GI input  Acute hypoxic respiratory failure - secondary to HCAP - ABX started 11/14, will continue for now and transition to oral ABX when able to swallow  - provide oxygen via [place]   Dysphagia - chronic in nature in pt with known history of esophageal stricture  - pt is at high risk of aspiration  - SLP requested and GI re consulted, no stricture on EGD - MBS requested   Leukocytosis - possibly related  to developing PNA noted on CXR 11/14 - pt started on Vancomycin and Maxipime 11/14, will continue for now as pt is doing better  - WBC is trending down and pt remains afebrile over the past 24 hours  - repeat CBC in AM  Diabetes mellitus, with complications of neuropathies  - keep on SSI for now until oral intake improves - readjust the regimen as indicated   Hypokalemia - supplement - repeat BMP in AM  Accelerated HTN - pt refusing PO hydralazine due to dysphagia  - continue Hydralazine IV scheduled and as needed for now - transition to oral regimen when able to take PO   Chronic back pain  - MRI lumbar spine with mild lumbar spine degenerative changes, no focal disc herniation, no definite nerve root encroachment - MRI thoracic spine with left paracentral disc extrusion at T7-8, no abnormal cord signal, no compression deformity or other a worrisome finding - PT/OT done, recommendation is to proceed with SNF placement but pt refusing to go to SNF - will discuss recommendations with family, pt is unsafe for d/c home  Urinary retention - foley placed   Generalized weakness - appears to be secondary to progressive deconditioning - PT/OT evaluation requested, SNF recommended  - pt agreeable to placement to SNF  Severe PCM - minimal oral intake since admission - possibly related to acute illness with underlying dysphagia of unclear etiology - EGD in AM and will possibly be able to advance diet   Acute functional quadriplegia - still poor mobility - move to chair at least  Q shift   DVT prophylaxis  Lovenox SQ while pt is in hospital  Code Status: DNR Family Communication: Partner at bedside  Disposition Plan: Remains inpatient   IV Access:    Peripheral IV Procedures and diagnostic studies:    Dg Abd 1 View 06/21/2014 Moderate stool burden. No acute findings.  Mr Thoracic Spine Wo Contrast 11/12/2015L paracentral disc extrusion at T7-8.  Effacement of ant subarachnoid space and mild cord flattening. No abnormal cord signal. No compression deformity or other a worrisome finding.  Mr Lumbar Spine Wo Contrast 06/20/2014 Transitional lumbosacral anatomy. L5 sacralized, lumbar spine degenerative changes, greatest at L3-4 where there is annular disc bulging, facet and ligamentous hypertrophy contributing to mild lateral recess and foraminal stenosis bilaterally. No focal disc herniation or definite nerve root encroachment.   Dg Chest Port 1 View 06/24/2014 Left lower lung airspace opacities suspicious for pneumonia or aspiration. Low volume film with mild bibasilar atelectasis.  Medical Consultants:    GI Other Consultants:    Physical therapy   OT Anti-Infectives:    Vancomycin 11/14 -->  Maxipime 11/14 -->  Faye Ramsay, MD  [facility] Pager [phone]  If 7PM-7AM, please contact night-coverage [email or web address] Password [facility] 06/27/2014, 5:38 PM   LOS: 6 days   HPI/Subjective: No events overnight.   Objective: Filed Vitals:   06/27/14 1340 06/27/14 1350 06/27/14 1427 06/27/14 1700  BP: 168/91 177/105 194/107 163/92  Pulse: 122 117 115 116  Temp:   98.1 F (36.7 C)   TempSrc:      Resp: 31 20 20    Height:      Weight:      SpO2: 93% 94% 93%     Intake/Output Summary (Last 24 hours) at 06/27/14 1738 Last data filed at 06/27/14 1500  Gross per 24 hour  Intake      0 ml  Output   1150 ml  Net  -1150 ml    Exam:   General:  Pt is alert, follows commands appropriately, not in acute distress  Cardiovascular: Regular rhythm, tachycardic, no rubs, no gallops  Respiratory: Clear to auscultation bilaterally, no wheezing, no crackles, no rhonchi  Abdomen: Soft, non tender, non distended, bowel sounds present, no guarding  Data Reviewed: Basic Metabolic Panel:  Recent Labs Lab 06/22/14 0450 06/23/14 0435 06/24/14 0515 06/25/14 0504 06/26/14 0417 06/27/14 0540  NA 140 137  139 143 144 143  K 2.9* 3.7 3.8 3.6* 4.0 3.3*  CL 101 99 101 104 106 100  CO2 24 20 20 25 23 29   GLUCOSE 97 125* 140* 156* 145* 145*  BUN 19 14 25* 33* 33* 28*  CREATININE 0.81 0.73 0.75 0.83 0.70 0.72  CALCIUM 9.2 9.8 9.7 10.0 9.7 9.5  MG 2.2  --   --   --   --   --   PHOS 2.5  --   --   --   --   --    Liver Function Tests:  Recent Labs Lab 06/21/14 1826 06/22/14 0450  AST 34 20  ALT 22 20  ALKPHOS 69 68  BILITOT 1.0 1.0  PROT 8.9* 8.0  ALBUMIN 4.7 4.3   CBC:  Recent Labs Lab 06/21/14 1826  06/24/14 0515 06/24/14 1147 06/25/14 0504 06/26/14 0417 06/27/14 0540  WBC 10.8*  < > 23.9* 18.9* 24.2* 15.0* 11.6*  NEUTROABS 7.5  --   --   --   --   --   --   HGB 17.1*  < > 16.2 14.9 15.2  14.6 14.3  HCT 47.1  < > 44.5 42.4 44.0 42.3 40.2  MCV 87.4  < > 87.8 87.1 89.1 89.8 89.7  PLT 233  < > 270 275 264 244 246  < > = values in this interval not displayed.  CBG:  Recent Labs Lab 06/26/14 2030 06/26/14 2341 06/27/14 0333 06/27/14 0729 06/27/14 1122  GLUCAP 122* 170* 118* 144* 108*    Recent Results (from the past 240 hour(s))  Culture, Urine     Status: None   Collection Time: 06/24/14 12:44 PM  Result Value Ref Range Status   Specimen Description URINE, CATHETERIZED  Final   Special Requests NONE  Final   Culture  Setup Time   Final    06/24/2014 20:47 Performed at [place] Performed at [facility]   Final   Culture NO GROWTH Performed at [facility]   Final   Report Status 06/25/2014 FINAL  Final     Scheduled Meds: . allopurinol  200 mg Oral Daily  . bisacodyl  10 mg Rectal BID  . ceFEPime (MAXIPIME) IV  1 g Intravenous Q8H  . DULoxetine  90 mg Oral Daily  . feeding supplement (ENSURE COMPLETE)  237 mL Oral BID BM  . hydrALAZINE  10 mg Intravenous 4 times per day  . insulin aspart  0-9 Units Subcutaneous 6 times per day  . methylphenidate  54 mg Oral Daily  . metoCLOPramide (REGLAN)  injection  5 mg Intravenous 4 times per day  . metoprolol  5 mg Intravenous 4 times per day  . ondansetron  4 mg Oral Q12H  . pantoprazole (PROTONIX) IV  40 mg Intravenous Q12H  . polyethylene glycol  17 g Oral BID  . pregabalin  200 mg Oral BID  . sennosides  5 mL Oral BID  . vancomycin  1,000 mg Intravenous Q8H   Continuous Infusions: . sodium chloride 30 mL/hr at 06/27/14 1116

## 2014-06-27 NOTE — Evaluation (Signed)
SLP Cancellation Note  Patient Details Name: Frank [name] MRN: [id number] DOB: Jun 14, 1956   Cancelled treatment:       Reason Eval/Treat Not Completed:  Order for MBS received, will plan for next day in am -  Note results of EGD earlier today.     Luanna Salk, [facility] SLP [phone]

## 2014-06-27 NOTE — Op Note (Signed)
[facility] [place], [address]   ENDOSCOPY PROCEDURE REPORT  PATIENT: [name], [name]  MR#: [id number] BIRTHDATE: [birth year] , 58  yrs. old GENDER: male ENDOSCOPIST: Lafayette Dragon, MD REFERRED BY:  Dr Mart Piggs PROCEDURE DATE:  06/27/2014 PROCEDURE:  EGD w/ wire guided (savary) dilation ASA CLASS:     Class III INDICATIONS:  dysphagia and patient underwent upper endoscopy with esophageal dilation in 2009. MEDICATIONS: Fentanyl 50 mcg IV and Versed 4 mg IV TOPICAL ANESTHETIC: Cetacaine Spray  DESCRIPTION OF PROCEDURE: After the risks benefits and alternatives of the procedure were thoroughly explained, informed consent was obtained.  The Pentax Gastroscope [id number] endoscope was introduced through the mouth and advanced to the second portion of the duodenum , Without limitations.  The instrument was slowly withdrawn as the mucosa was fully examined.    Esophagus: proximal, mid and distal esophageal mucosa was unremarkable. There was small amount of retained secretions in the esophagus. There was no dilatation and no stricture. Squamocolumnar junction was normal. There was no hiatal hernia lower esophageal sphincter was closed and there was mild resistance traversing into the stomach Stomach: gastric folds were normal. Gastric antrum and pyloric outlet was unremarkable. Retroflexion of the endoscope revealed normal fundus and cardia. There was minimal erythema in the prepyloric antrum Duodenum: duodenal bulb and descending duodenum was normal Several Ray dilator 16 mm passed without fluoroscopic guidance and without much resistance into the stomach. There was no blood on the dilator[         The scope was then withdrawn from the patient and the procedure completed.  COMPLICATIONS: There were no immediate complications.  ENDOSCOPIC IMPRESSION 1.no evidence of esophageal stricture. 2. Status post passage of 16 mm Savary dilator suspect  esophageal dysmotility. Small amount of retained secretions in the   esophagus consistent with motility disorder  RECOMMENDATIONS: 1.  Anti-reflux regimen to be follow 2.  Continue PPI 3 .modified diet according to speech pathology recommendations  REPEAT EXAM: no  eSigned:  Lafayette Dragon, MD 06/27/2014 1:41 PM    CC:  PATIENT NAME:  Frank [name], Frank [name] MR#: [id number]

## 2014-06-28 ENCOUNTER — Encounter (HOSPITAL_COMMUNITY): Payer: Self-pay | Admitting: Internal Medicine

## 2014-06-28 LAB — CBC
HCT: 41.1 % (ref 39.0–52.0)
HEMOGLOBIN: 13.8 g/dL (ref 13.0–17.0)
MCH: 30.6 pg (ref 26.0–34.0)
MCHC: 33.6 g/dL (ref 30.0–36.0)
MCV: 91.1 fL (ref 78.0–100.0)
Platelets: 217 10*3/uL (ref 150–400)
RBC: 4.51 MIL/uL (ref 4.22–5.81)
RDW: 13.5 % (ref 11.5–15.5)
WBC: 8.7 10*3/uL (ref 4.0–10.5)

## 2014-06-28 LAB — BASIC METABOLIC PANEL
Anion gap: 11 (ref 5–15)
BUN: 31 mg/dL — ABNORMAL HIGH (ref 6–23)
CHLORIDE: 101 meq/L (ref 96–112)
CO2: 30 meq/L (ref 19–32)
Calcium: 9.6 mg/dL (ref 8.4–10.5)
Creatinine, Ser: 0.92 mg/dL (ref 0.50–1.35)
GFR calc Af Amer: >90 mL/min (ref >90)
GFR calc non Af Amer: 90 mL/min — ABNORMAL LOW (ref >90)
GLUCOSE: 133 mg/dL — AB (ref 70–99)
Potassium: 3.5 mEq/L — ABNORMAL LOW (ref 3.7–5.3)
Sodium: 142 mEq/L (ref 137–147)

## 2014-06-28 LAB — GLUCOSE, CAPILLARY
GLUCOSE-CAPILLARY: 156 mg/dL — AB (ref 70–99)
Glucose-Capillary: 114 mg/dL — ABNORMAL HIGH (ref 70–99)
Glucose-Capillary: 134 mg/dL — ABNORMAL HIGH (ref 70–99)
Glucose-Capillary: 146 mg/dL — ABNORMAL HIGH (ref 70–99)
Glucose-Capillary: 146 mg/dL — ABNORMAL HIGH (ref 70–99)

## 2014-06-28 MED ORDER — ONDANSETRON HCL 4 MG PO TABS: 4.0000 mg | ORAL_TABLET | Freq: Three times a day (TID) | ORAL | Status: DC | PRN

## 2014-06-28 MED ORDER — METOCLOPRAMIDE HCL 5 MG PO TABS: 5.0000 mg | ORAL_TABLET | Freq: Three times a day (TID) | ORAL | Status: DC

## 2014-06-28 MED ORDER — PANTOPRAZOLE SODIUM 40 MG PO TBEC: 40.0000 mg | DELAYED_RELEASE_TABLET | Freq: Every day | ORAL | Status: DC

## 2014-06-28 MED ORDER — CLONAZEPAM 0.5 MG PO TABS
0.5000 mg | ORAL_TABLET | Freq: Two times a day (BID) | ORAL | Status: DC | PRN
Start: 2014-06-28 — End: 2014-07-29

## 2014-06-28 MED ORDER — LEVOFLOXACIN 500 MG PO TABS: 500.0000 mg | ORAL_TABLET | Freq: Every day | ORAL | Status: DC

## 2014-06-28 MED ORDER — OXYCODONE-ACETAMINOPHEN 7.5-325 MG PO TABS: 1.0000 | ORAL_TABLET | Freq: Four times a day (QID) | ORAL | Status: DC | PRN

## 2014-06-28 MED ORDER — POTASSIUM CHLORIDE CRYS ER 20 MEQ PO TBCR
40.0000 meq | EXTENDED_RELEASE_TABLET | Freq: Once | ORAL | Status: AC
  Administered 2014-06-28: 40 meq via ORAL
  Filled 2014-06-28: qty 2

## 2014-06-28 MED ORDER — ONDANSETRON HCL 4 MG PO TABS: 4.0000 mg | ORAL_TABLET | Freq: Two times a day (BID) | ORAL | Status: DC

## 2014-06-28 MED ORDER — HYDRALAZINE HCL 10 MG PO TABS: 10.0000 mg | ORAL_TABLET | Freq: Three times a day (TID) | ORAL | Status: DC

## 2014-06-28 NOTE — Progress Notes (Signed)
Clinical Social Work  CSW faxed DC summary to [facility] who is agreeable to accept patient today and has Ship broker. CSW prepared DC packet with FL2, DC summary, DNR and hard scripts included. Patient, brother, and partner all aware and agreeable to plans. RN to call report. CSW coordinated transportation via [place]. PTAR #: [id number].  CSW is signing off but available if needed.  [place], [place] [phone]

## 2014-06-28 NOTE — Progress Notes (Signed)
Occupational Therapy Treatment Patient Details Name: Frank [name] MRN: [id number] DOB: 07-09-1956 Today's Date: 06/28/2014    History of present illness 58 yo Male adm 06/21/14 through [facility] ED with progressive weakness, nausea,  vomiting, poor oral intake and  history of L lateral malleolus fracturea and limited mobility since  (followed by Dr. Charlann Boxer at [facility])  PMHx: chronic back pain, frequent falls at home. This has been associated with poor mobility, inability to bear weight, occasional LE numbness and tingling.     OT comments  Goals downgraded; pt motivated but very ataxic  Follow Up Recommendations  SNF    Equipment Recommendations   (not ready for 3:1 at this time)    Recommendations for Other Services      Precautions / Restrictions Precautions Precautions: Back;Fall Restrictions Weight Bearing Restrictions: No       Mobility Bed Mobility               General bed mobility comments: attempted to pull forward and roll in bed to remove pillow from upper trunk  Transfers                      Balance                                   ADL Overall ADL's : Needs assistance/impaired Eating/Feeding: Maximal assistance;Bed level (HOB raised: drink)   Grooming: Wash/dry face;Oral care;Bed level (HOB raised)                                 General ADL Comments: washed face with min A to control ataxic movements:  encouraged use of bil UEs with task with elbows tucked into side of body to stabilize.  Rinsed mouth with biotene then brushed teeth with max A; used table raised to shoulder height to support elbow for this task.      Vision                     Perception     Praxis      Cognition   Behavior During Therapy: Flat affect Overall Cognitive Status: Impaired/Different from baseline            Safety/Judgement: Decreased awareness of deficits          Extremity/Trunk Assessment                Exercises     Shoulder Instructions       General Comments      Pertinent Vitals/ Pain       Pain Assessment: Faces Faces Pain Scale: Hurts even more Pain Location: back Pain Intervention(s): Limited activity within patient's tolerance;Monitored during session;Repositioned  Home Living                                          Prior Functioning/Environment              Frequency Min 2X/week     Progress Toward Goals  OT Goals(current goals can now be found in the care plan section)  Progress towards OT goals: Goals drowngraded-see care plan     Plan      Co-evaluation  End of Session     Activity Tolerance Patient limited by fatigue   Patient Left in chair;with family/visitor present   Nurse Communication          Time: 2703-5009 OT Time Calculation (min): 28 min  Charges: OT General Charges $OT Visit: 1 Procedure OT Treatments $Self Care/Home Management : 23-37 mins  Tayra Dawe 06/28/2014, 12:01 PM  Lesle Chris, OTR/L [phone] 06/28/2014

## 2014-06-28 NOTE — Discharge Summary (Addendum)
Physician Discharge Summary  Frank [name] SMO:[id number] DOB: Feb 17, 1956 DOA: 06/21/2014  PCP: Cathlean Cower, MD  Admit date: 06/21/2014 Discharge date: 06/28/2014  Recommendations for Outpatient Follow-up:  1. Pt will need to follow up with PCP in 2-3 weeks post discharge 2. Please obtain BMP to evaluate electrolytes and kidney function 3. Please also check CBC to evaluate Hg and Hct levels 4. Continue Levaquin for 5 more days post discharge  5. Please contact PCP for continuing Concerta   Discharge Diagnoses:  Active Problems:   Diabetes   Essential hypertension   Nausea and vomiting   Chronic back pain greater than 3 months duration   Dehydration   Generalized weakness   Nausea with vomiting   Discharge Condition: Stable  Diet recommendation: Dys III diet   Brief narrative: 58 y.o. Male with history of ankle fracture and follows with sports medicine, chronic back pain (on multiple analgesics including Percocet, Nucynta, lyrica, mobic), frequent falls at home, presented to [facility] ED with main concern of progressive weakness, nausea, non bloody vomiting, poor oral intake. He was sent from PCP office to the ED for further evaluation. In addition, pt reported mid area back pain, constant and throbbing, 10/10 in severity, radiating to the sides and to the front epigastric area, with no specific alleviating factors. This has been associated with poor mobility, inability to bear weight, occasional LE numbness and tingling.   Overall, very slow clinical recovery, more dysphagia reports 11/15 and SLP requested. GI re consulted with plan to do EGD 06/27/2014.   Assessment and Plan:    Active Problems:  Nausea, vomiting, poor oral intake - possibly related to multiple narcotic medications, ? Gastroparesis - minimize narcotic use as possible  - pt actually feeling better this AM and denies vomiting , his is alert and at baseline mental state of functioning  - no aortic  dissection or aneurysm noted on recent Ct angio abd 11/07  Acute hypoxic respiratory failure - secondary to HCAP - ABX started 11/14, started on vanc and maxipime, stop both abx 11/18 and transition to oral Levaquin today for 5 more days post discharge   Dysphagia - chronic in nature in pt with known history of esophageal stricture  - pt is at high risk of aspiration  - SLP requested and GI re consulted, no stricture on EGD - dys III diet recommended   Leukocytosis - possibly related to developing PNA noted on CXR 11/14 - pt started on Vancomycin and Maxipime 11/14, will change to Levaquin upon discharge  - WBC is trending down and is WNL this AM, pt remains afebrile over the past 24 hours   Diabetes mellitus, with complications of neuropathies  - continue glipizide   Hypokalemia - supplemented prior to discharge   Accelerated HTN - continue hydralazine and Norvasc upon discharge   Chronic back pain  - MRI lumbar spine with mild lumbar spine degenerative changes, no focal disc herniation, no definite nerve root encroachment - MRI thoracic spine with left paracentral disc extrusion at T7-8, no abnormal cord signal, no compression deformity or other a worrisome finding - PT/OT done, recommendation is to proceed with SNF placement but pt refusing to go to SNF - will discuss recommendations with family, pt is unsafe for d/c home  Urinary retention - foley placed, discontinue within next 48 - 48 hours   Generalized weakness - appears to be secondary to progressive deconditioning - PT/OT evaluation requested, SNF recommended  - pt agreeable to placement to SNF  Severe  PCM - possibly related to acute illness with underlying dysphagia of unclear etiology - EGD with no strictures - dys III diet as noted above   Acute functional quadriplegia - still poor mobility - d/c SNF  Code Status: DNR Family Communication: Partner at bedside  Disposition Plan: SNF   IV  Access:    Peripheral IV Procedures and diagnostic studies:    Dg Abd 1 View 06/21/2014 Moderate stool burden. No acute findings.  Mr Thoracic Spine Wo Contrast 11/12/2015L paracentral disc extrusion at T7-8. Effacement of ant subarachnoid space and mild cord flattening. No abnormal cord signal. No compression deformity or other a worrisome finding.  Mr Lumbar Spine Wo Contrast 06/20/2014 Transitional lumbosacral anatomy. L5 sacralized, lumbar spine degenerative changes, greatest at L3-4 where there is annular disc bulging, facet and ligamentous hypertrophy contributing to mild lateral recess and foraminal stenosis bilaterally. No focal disc herniation or definite nerve root encroachment.   Dg Chest Port 1 View 06/24/2014 Left lower lung airspace opacities suspicious for pneumonia or aspiration. Low volume film with mild bibasilar atelectasis.  Medical Consultants:    GI Other Consultants:    Physical therapy   OT Anti-Infectives:    Vancomycin 11/14 --> 11/18  Maxipime 11/14 --> 11/18  Levaquin 11/18 --> 5 more days post discharge   Discharge Exam: Filed Vitals:   06/28/14 0433  BP: 146/82  Pulse: 80  Temp: 97.8 F (36.6 C)  Resp: 20   Filed Vitals:   06/27/14 2100 06/28/14 0044 06/28/14 0159 06/28/14 0433  BP: 125/78 143/79 135/66 146/82  Pulse: 93 79 70 80  Temp: 98.1 F (36.7 C)  97.9 F (36.6 C) 97.8 F (36.6 C)  TempSrc: Oral  Oral Oral  Resp: 18  22 20   Height:      Weight:      SpO2: 96% 99% 97% 96%    General: Pt is alert, follows commands appropriately, not in acute distress Cardiovascular: Regular rate and rhythm, S1/S2 +, no murmurs, no rubs, no gallops Respiratory: Clear to auscultation bilaterally, no wheezing, no crackles, no rhonchi Abdominal: Soft, non tender, non distended, bowel sounds +, no guarding  Discharge Instructions:    Medication List    TAKE these medications         allopurinol 100 MG tablet  Commonly known as:  ZYLOPRIM  Take 2 tablets (200 mg total) by mouth daily.     amLODipine 10 MG tablet  Commonly known as:  NORVASC  Take 1 tablet (10 mg total) by mouth daily.     atorvastatin 10 MG tablet  Commonly known as:  LIPITOR  Take 10 mg by mouth daily.     clonazePAM 0.5 MG tablet  Commonly known as:  KLONOPIN  Take 1 tablet (0.5 mg total) by mouth 2 (two) times daily as needed for anxiety.     colchicine 0.6 MG tablet  Take 1 tablet (0.6 mg total) by mouth 2 (two) times daily.     DULoxetine 30 MG capsule  Commonly known as:  CYMBALTA  Take 90 mg by mouth daily.     glipiZIDE 2.5 MG 24 hr tablet  Commonly known as:  GLUCOTROL XL  Take 1 tablet (2.5 mg total) by mouth daily with breakfast.     glucose blood test strip  Commonly known as:  FREESTYLE TEST STRIPS  Use as instructed     hydrALAZINE 10 MG tablet  Commonly known as:  APRESOLINE  Take 1 tablet (10 mg total) by mouth 3 (  three) times daily.     levofloxacin 500 MG tablet  Commonly known as:  LEVAQUIN  Take 1 tablet (500 mg total) by mouth daily.     meloxicam 15 MG tablet  Commonly known as:  MOBIC  Take 1 tablet (15 mg total) by mouth daily.        metoCLOPramide 5 MG tablet  Commonly known as:  REGLAN  Take 1 tablet (5 mg total) by mouth 4 (four) times daily -  before meals and at bedtime.        ondansetron 4 MG tablet  Commonly known as:  ZOFRAN  Take 1 tablet (4 mg total) by mouth 2 (two) times daily.     ondansetron 4 MG tablet  Commonly known as:  ZOFRAN  Take 1 tablet (4 mg total) by mouth every 8 (eight) hours as needed for nausea or vomiting.     oxyCODONE-acetaminophen 7.5-325 MG per tablet  Commonly known as:  PERCOCET  Take 1 tablet by mouth every 6 (six) hours as needed for pain.     pantoprazole 40 MG tablet  Commonly known as:  PROTONIX  Take 1 tablet (40 mg total) by mouth daily.     pregabalin 200 MG capsule  Commonly known as:  LYRICA   Take 200 mg by mouth 2 (two) times daily.          The results of significant diagnostics from this hospitalization (including imaging, microbiology, ancillary and laboratory) are listed below for reference.     Microbiology: Recent Results (from the past 240 hour(s))  Culture, Urine     Status: None   Collection Time: 06/24/14 12:44 PM  Result Value Ref Range Status   Specimen Description URINE, CATHETERIZED  Final   Special Requests NONE  Final   Culture  Setup Time   Final    06/24/2014 20:47 Performed at [place] Performed at [facility]   Final   Culture NO GROWTH Performed at [facility]   Final   Report Status 06/25/2014 FINAL  Final     Labs: Basic Metabolic Panel:  Recent Labs Lab 06/22/14 0450  06/24/14 0515 06/25/14 0504 06/26/14 0417 06/27/14 0540 06/28/14 0433  NA 140  < > 139 143 144 143 142  K 2.9*  < > 3.8 3.6* 4.0 3.3* 3.5*  CL 101  < > 101 104 106 100 101  CO2 24  < > 20 25 23 29 30   GLUCOSE 97  < > 140* 156* 145* 145* 133*  BUN 19  < > 25* 33* 33* 28* 31*  CREATININE 0.81  < > 0.75 0.83 0.70 0.72 0.92  CALCIUM 9.2  < > 9.7 10.0 9.7 9.5 9.6  MG 2.2  --   --   --   --   --   --   PHOS 2.5  --   --   --   --   --   --   < > = values in this interval not displayed. Liver Function Tests:  Recent Labs Lab 06/21/14 1826 06/22/14 0450  AST 34 20  ALT 22 20  ALKPHOS 69 68  BILITOT 1.0 1.0  PROT 8.9* 8.0  ALBUMIN 4.7 4.3   CBC:  Recent Labs Lab 06/21/14 1826  06/24/14 1147 06/25/14 0504 06/26/14 0417 06/27/14 0540 06/28/14 0433  WBC 10.8*  < > 18.9* 24.2* 15.0* 11.6* 8.7  NEUTROABS 7.5  --   --   --   --   --   --  HGB 17.1*  < > 14.9 15.2 14.6 14.3 13.8  HCT 47.1  < > 42.4 44.0 42.3 40.2 41.1  MCV 87.4  < > 87.1 89.1 89.8 89.7 91.1  PLT 233  < > 275 264 244 246 217  < > = values in this interval not displayed.  CBG:  Recent Labs Lab 06/27/14 1633 06/27/14 2015  06/28/14 0004 06/28/14 0408 06/28/14 0735  GLUCAP 179* 134* 156* 114* 134*     SIGNED: Time coordinating discharge: Over 30 minutes  MAGICK-Jabree Rebert, MD  [facility] 06/28/2014, 11:39 AM Pager [phone]  If 7PM-7AM, please contact night-coverage [email or web address] Password [email or web address]

## 2014-06-28 NOTE — Progress Notes (Signed)
NUTRITION FOLLOW UP/Consult   INTERVENTION: CHO MOD diet Will add Dysphagia 3 to diet order Continue Ensure Complete po BID, each supplement provides 350 kcal and 13 grams of protein Encouraged po intake as tolerated RD to follow.  NUTRITION DIAGNOSIS: Inadequate oral intake related to nausea and vomiting as evidenced by 0% meal completion and 2.5% weight loss in the past week. Ongoing.  Goal: Pt to meet >/= 90% of their estimated nutrition needs-not met  Monitor:  Diet advancement, PO intake/tolerance, weight trend, labs  Reason for Assessment: Malnutrition Screening Tool, score of 3  58 y.o. male  Admitting Dx: Nausea and Vomiting with Dehydration  ASSESSMENT: 58 year old male with history of colonic polyps, diabetes mellitus Type 2, anxiety depression, ADD, sleep apnea, peripheral neuropathy, hypertension, GERD, and hiatal hernia. Patient  had a fall in end of July and have sustained ankle fracture followed by sports medicine. Patient have had diminished ambulation ever since stating due to pain. His been taking prescribed opioids for pain management. He has been having worsening nausea and vomiting for past few days. Reports not able to keep anything down. Hospitalist was called for admission for not able to walk and continuous nausea and vomiting with dehydration.   11/13: Patient resting at time of visit and kept eyes closed during assessment, only answering yes or no questions. Patient had some ginger ale at bedside. Patient reports ongoing nausea today but, he has been able to keep some clear liquids down. He reports eating well before N/V started and maintaining his weight at 200 lbs. Pt has lost 5 lbs, 2.5% of his body weight, in the past week. Per nursing notes pt ate 0% of breakfast this morning and 0% of lunch.  Pt agreeable to trying Resource Breeze supplements while on the clear liquid diet.   11/16: Tolerated small amounts of full liquids this am without  vomiting. Did not like Resource. Friend reports very poor intake. Appears very week.  Preparing for walk with PT at time of visit. Labs reviewed.  Patient had been taking prescribed opioids for pain management. Seen by SLP today.  Symptoms prior to previous dilation done in 2009.  11/18:   Very poor appetite Poor intake with 25-50% meals States he has not tried the Ensure Refused to go down for MBS.  Height: Ht Readings from Last 1 Encounters:  06/21/14 _0  (1.753 m)    Weight: Wt Readings from Last 1 Encounters:  06/21/14 195 lb (88.451 kg)    Ideal Body Weight: 160 lbs  % Ideal Body Weight: 122%  Wt Readings from Last 10 Encounters:  06/21/14 195 lb (88.451 kg)  06/20/14 200 lb (90.719 kg)  05/19/14 200 lb (90.719 kg)  05/17/14 200 lb (90.719 kg)  04/18/14 199 lb (90.266 kg)  03/27/14 206 lb (93.441 kg)  03/10/14 200 lb (90.719 kg)  03/06/14 200 lb (90.719 kg)  01/12/14 207 lb 6 oz (94.065 kg)  12/30/13 210 lb (95.255 kg)    Usual Body Weight: 200 lbs  % Usual Body Weight: 97.5%  BMI:  Body mass index is 28.78 kg/(m^2).  Estimated Nutritional Needs: Kcal: 2100-2300 Protein: 90-105 grams Fluid: 2.3-2.6 L/day  Skin: intact  Diet Order: Diet - low sodium heart healthy Diet Carb Modified  EDUCATION NEEDS: -No education needs identified at this time   Intake/Output Summary (Last 24 hours) at 06/28/14 1545 Last data filed at 06/28/14 1423  Gross per 24 hour  Intake    120 ml  Output  1100 ml  Net   -980 ml    Last BM: 11/6  Labs:   Recent Labs Lab 06/22/14 0450  06/26/14 0417 06/27/14 0540 06/28/14 0433  NA 140  < > 144 143 142  K 2.9*  < > 4.0 3.3* 3.5*  CL 101  < > 106 100 101  CO2 24  < > _0 BUN 19  < > 33* 28* 31*  CREATININE 0.81  < > 0.70 0.72 0.92  CALCIUM 9.2  < > 9.7 9.5 9.6  MG 2.2  --   --   --   --   PHOS 2.5  --   --   --   --   GLUCOSE 97  < > 145* 145* 133*  < > = values in this interval not  displayed.  CBG (last 3)   Recent Labs  06/28/14 0408 06/28/14 0735 06/28/14 1124  GLUCAP 114* 134* 146*    Scheduled Meds: . allopurinol  200 mg Oral Daily  . bisacodyl  10 mg Rectal BID  . ceFEPime (MAXIPIME) IV  1 g Intravenous Q8H  . DULoxetine  90 mg Oral Daily  . feeding supplement (ENSURE COMPLETE)  237 mL Oral BID BM  . hydrALAZINE  10 mg Intravenous 4 times per day  . insulin aspart  0-9 Units Subcutaneous 6 times per day  . methylphenidate  54 mg Oral Daily  . metoCLOPramide (REGLAN) injection  5 mg Intravenous 4 times per day  . metoprolol  5 mg Intravenous 4 times per day  . ondansetron  4 mg Oral Q12H  . pantoprazole (PROTONIX) IV  40 mg Intravenous Q12H  . polyethylene glycol  17 g Oral BID  . pregabalin  200 mg Oral BID  . sennosides  5 mL Oral BID  . vancomycin  1,000 mg Intravenous Q8H    Continuous Infusions:    Past Medical History  Diagnosis Date  . COLONIC POLYPS 02/01/2008  . DIABETES MELLITUS, TYPE II 05/20/2010  . HYPERLIPIDEMIA 10/09/2008  . ANXIETY DEPRESSION 02/01/2008  . ERECTILE DYSFUNCTION 10/09/2008  . ADD 10/09/2008  . SLEEP APNEA, OBSTRUCTIVE 02/01/2008  . MORTON'S NEUROMA 05/20/2010  . PERIPHERAL NEUROPATHY 05/20/2010  . Other specified forms of hearing loss 06/27/2009  . HYPERTENSION 10/09/2008  . HEMORRHOIDS 02/01/2008  . ALLERGIC RHINITIS 10/09/2008  . Stricture and stenosis of esophagus 02/02/2008  . GERD 02/01/2008  . HIATAL HERNIA 02/01/2008  . ERECTILE DYSFUNCTION, ORGANIC 05/20/2010  . WRIST PAIN, LEFT 12/05/2009  . FOOT PAIN, LEFT 05/20/2010  . PERIPHERAL EDEMA 05/20/2010  . DYSPNEA 03/12/2010  . Abdominal pain, unspecified site 01/19/2009  . Abdominal pain, left lower quadrant 06/06/2010  . Type II or unspecified type diabetes mellitus without mention of complication, uncontrolled 11/14/2010    Past Surgical History  Procedure Laterality Date  . Carpal tunnel release    . Rotator cuff repair    . Esophageal dilation  july 2009  .  Eye surgery    . Esophagogastroduodenoscopy N/A 06/27/2014    Procedure: ESOPHAGOGASTRODUODENOSCOPY (EGD);  Surgeon: Lafayette Dragon, MD;  Location: [facility];  Service: Endoscopy;  Laterality: N/A;    Antonieta Iba, RD, LDN Clinical Inpatient Dietitian Pager:  [phone] Weekend and after hours pager:  [phone]

## 2014-06-28 NOTE — Discharge Instructions (Signed)

## 2014-06-28 NOTE — Progress Notes (Signed)
Clinical Social Work  Patient reported interest in completing advanced directives. CSW met with patient at bedside and assisted him with completing forms. Patient reports he wants partner to review packet prior to signing any information. CSW reminded patient not to sign any forms until CSW present to notarize forms.   CSW will continue to follow.  [place], [place] [phone]

## 2014-06-28 NOTE — Plan of Care (Signed)
Problem: Acute Rehab OT Goals (only OT should resolve) Goal: Pt. Will Transfer To Toilet Outcome: Not Applicable Date Met:  38/18/40 Goal: Pt. Will Perform Toileting-Clothing Manipulation Outcome: Not Met (add Reason)

## 2014-06-28 NOTE — Progress Notes (Signed)
Report called to [facility] and report given to Tiney Rouge, RN. Number left for staff if needed for questions. Patient transported via [place].

## 2014-06-28 NOTE — Evaluation (Signed)
SLP Cancellation Note  Patient Details Name: Frank [name] MRN: [id number] DOB: 02-07-1956   Cancelled treatment:       Reason Eval/Treat Not Completed: Other (comment);Patient declined, no reason specified (per radiology staff when transporter arrived to room, pt declined to go to xray for MBS)   Luanna Salk, [facility] SLP [phone]

## 2014-06-29 ENCOUNTER — Encounter (HOSPITAL_COMMUNITY): Payer: Self-pay | Admitting: Emergency Medicine

## 2014-06-29 ENCOUNTER — Other Ambulatory Visit: Payer: Self-pay

## 2014-06-29 ENCOUNTER — Emergency Department (HOSPITAL_COMMUNITY)
Admission: EM | Admit: 2014-06-29 | Discharge: 2014-06-29 | Disposition: A | Discharge status: Skilled Nursing Facility | Payer: BC Managed Care – PPO | Attending: Emergency Medicine | Admitting: Emergency Medicine

## 2014-06-29 DIAGNOSIS — K219 Gastro-esophageal reflux disease without esophagitis: Secondary | ICD-10-CM | POA: Insufficient documentation

## 2014-06-29 DIAGNOSIS — Z791 Long term (current) use of non-steroidal anti-inflammatories (NSAID): Secondary | ICD-10-CM | POA: Diagnosis not present

## 2014-06-29 DIAGNOSIS — M546 Pain in thoracic spine: Secondary | ICD-10-CM | POA: Diagnosis not present

## 2014-06-29 DIAGNOSIS — Z8739 Personal history of other diseases of the musculoskeletal system and connective tissue: Secondary | ICD-10-CM | POA: Insufficient documentation

## 2014-06-29 DIAGNOSIS — Z792 Long term (current) use of antibiotics: Secondary | ICD-10-CM | POA: Diagnosis not present

## 2014-06-29 DIAGNOSIS — Z8669 Personal history of other diseases of the nervous system and sense organs: Secondary | ICD-10-CM | POA: Insufficient documentation

## 2014-06-29 DIAGNOSIS — I10 Essential (primary) hypertension: Secondary | ICD-10-CM | POA: Insufficient documentation

## 2014-06-29 DIAGNOSIS — Z79899 Other long term (current) drug therapy: Secondary | ICD-10-CM | POA: Insufficient documentation

## 2014-06-29 DIAGNOSIS — M549 Dorsalgia, unspecified: Secondary | ICD-10-CM | POA: Diagnosis present

## 2014-06-29 DIAGNOSIS — Z87438 Personal history of other diseases of male genital organs: Secondary | ICD-10-CM | POA: Insufficient documentation

## 2014-06-29 DIAGNOSIS — F418 Other specified anxiety disorders: Secondary | ICD-10-CM | POA: Diagnosis not present

## 2014-06-29 DIAGNOSIS — E119 Type 2 diabetes mellitus without complications: Secondary | ICD-10-CM | POA: Diagnosis not present

## 2014-06-29 LAB — BASIC METABOLIC PANEL
Anion gap: 14 (ref 5–15)
BUN: 25 mg/dL — AB (ref 6–23)
CO2: 27 meq/L (ref 19–32)
CREATININE: 0.82 mg/dL (ref 0.50–1.35)
Calcium: 9.5 mg/dL (ref 8.4–10.5)
Chloride: 100 mEq/L (ref 96–112)
GFR calc Af Amer: >90 mL/min (ref >90)
GFR calc non Af Amer: >90 mL/min (ref >90)
Glucose, Bld: 142 mg/dL — ABNORMAL HIGH (ref 70–99)
POTASSIUM: 3.9 meq/L (ref 3.7–5.3)
Sodium: 141 mEq/L (ref 137–147)

## 2014-06-29 LAB — CBC WITH DIFFERENTIAL/PLATELET
Basophils Absolute: 0 10*3/uL (ref 0.0–0.1)
Basophils Relative: 1 % (ref 0–1)
EOS ABS: 0.1 10*3/uL (ref 0.0–0.7)
EOS PCT: 2 % (ref 0–5)
HEMATOCRIT: 41.3 % (ref 39.0–52.0)
Hemoglobin: 14.4 g/dL (ref 13.0–17.0)
Lymphocytes Relative: 20 % (ref 12–46)
Lymphs Abs: 1.5 10*3/uL (ref 0.7–4.0)
MCH: 30.4 pg (ref 26.0–34.0)
MCHC: 34.9 g/dL (ref 30.0–36.0)
MCV: 87.3 fL (ref 78.0–100.0)
Monocytes Absolute: 0.8 10*3/uL (ref 0.1–1.0)
Monocytes Relative: 11 % (ref 3–12)
Neutro Abs: 5.2 10*3/uL (ref 1.7–7.7)
Neutrophils Relative %: 68 % (ref 43–77)
PLATELETS: 239 10*3/uL (ref 150–400)
RBC: 4.73 MIL/uL (ref 4.22–5.81)
RDW: 12.7 % (ref 11.5–15.5)
WBC: 7.7 10*3/uL (ref 4.0–10.5)

## 2014-06-29 LAB — URINALYSIS, ROUTINE W REFLEX MICROSCOPIC
Bilirubin Urine: NEGATIVE
Glucose, UA: NEGATIVE mg/dL
Ketones, ur: 15 mg/dL — AB
Leukocytes, UA: NEGATIVE
Nitrite: NEGATIVE
Protein, ur: NEGATIVE mg/dL
Specific Gravity, Urine: 1.02 (ref 1.005–1.030)
Urobilinogen, UA: 0.2 mg/dL (ref 0.0–1.0)
pH: 8 (ref 5.0–8.0)

## 2014-06-29 LAB — URINE MICROSCOPIC-ADD ON

## 2014-06-29 LAB — I-STAT TROPONIN, ED: TROPONIN I, POC: 0.02 ng/mL (ref 0.00–0.08)

## 2014-06-29 MED ORDER — MORPHINE SULFATE 4 MG/ML IJ SOLN
6.0000 mg | Freq: Once | INTRAMUSCULAR | Status: AC
  Administered 2014-06-29: 6 mg via INTRAVENOUS
  Filled 2014-06-29: qty 2

## 2014-06-29 MED ORDER — DIPHENHYDRAMINE HCL 50 MG/ML IJ SOLN
25.0000 mg | Freq: Once | INTRAMUSCULAR | Status: AC
  Administered 2014-06-29: 25 mg via INTRAVENOUS
  Filled 2014-06-29: qty 1

## 2014-06-29 NOTE — ED Provider Notes (Signed)
CSN: [id number]     Arrival date & time 06/20/14  0802 History   First MD Initiated Contact with Patient 06/20/14 [phone]     Chief Complaint  Patient presents with  . Back Pain     (Consider location/radiation/quality/duration/timing/severity/associated sxs/prior Treatment) HPI   58-year-old male with lower back pain. Chronic in nature, worse with the last couple days. Denies any acute trauma. Pain is diffuse across his lower back. No appreciable exacerbating relieving factors. No fevers or chills. Feels very weak. He describes generalized weakness, but says this is to the degree that he is having some difficulty and is walking around. No urinary complaints. No incontinence/retention. Takes nucynta chronic back pain. Feels like he is not getting adequate pain relief with it currently.  Past Medical History  Diagnosis Date  . COLONIC POLYPS 02/01/2008  . DIABETES MELLITUS, TYPE II 05/20/2010  . HYPERLIPIDEMIA 10/09/2008  . ANXIETY DEPRESSION 02/01/2008  . ERECTILE DYSFUNCTION 10/09/2008  . ADD 10/09/2008  . SLEEP APNEA, OBSTRUCTIVE 02/01/2008  . MORTON'S NEUROMA 05/20/2010  . PERIPHERAL NEUROPATHY 05/20/2010  . Other specified forms of hearing loss 06/27/2009  . HYPERTENSION 10/09/2008  . HEMORRHOIDS 02/01/2008  . ALLERGIC RHINITIS 10/09/2008  . Stricture and stenosis of esophagus 02/02/2008  . GERD 02/01/2008  . HIATAL HERNIA 02/01/2008  . ERECTILE DYSFUNCTION, ORGANIC 05/20/2010  . WRIST PAIN, LEFT 12/05/2009  . FOOT PAIN, LEFT 05/20/2010  . PERIPHERAL EDEMA 05/20/2010  . DYSPNEA 03/12/2010  . Abdominal pain, unspecified site 01/19/2009  . Abdominal pain, left lower quadrant 06/06/2010  . Type II or unspecified type diabetes mellitus without mention of complication, uncontrolled 11/14/2010   Past Surgical History  Procedure Laterality Date  . Carpal tunnel release    . Rotator cuff repair    . Esophageal dilation  july 2009  . Eye surgery    . Esophagogastroduodenoscopy N/A 06/27/2014     Procedure: ESOPHAGOGASTRODUODENOSCOPY (EGD);  Surgeon: Lafayette Dragon, MD;  Location: [facility];  Service: Endoscopy;  Laterality: N/A;   Family History  Problem Relation Age of Onset  . Diabetes Mother   . Heart disease Mother   . Hyperlipidemia Mother   . Depression Mother   . Diabetes Brother   . Colon cancer Neg Hx    History  Substance Use Topics  . Smoking status: Never Smoker   . Smokeless tobacco: Never Used  . Alcohol Use: Yes     Comment: occasional    Review of Systems  All systems reviewed and negative, other than as noted in HPI.   Allergies  Codeine and Metformin and related  Home Medications   Prior to Admission medications   Medication Sig Start Date End Date Taking? Authorizing Provider  allopurinol (ZYLOPRIM) 100 MG tablet Take 2 tablets (200 mg total) by mouth daily. 03/27/14  Yes Lyndal Pulley, DO  amLODipine (NORVASC) 10 MG tablet Take 1 tablet (10 mg total) by mouth daily. 03/27/14  Yes Lyndal Pulley, DO  DULoxetine (CYMBALTA) 30 MG capsule Take 30 mg by mouth daily.    Yes Historical Provider, MD  glipiZIDE (GLUCOTROL XL) 2.5 MG 24 hr tablet Take 1 tablet (2.5 mg total) by mouth daily with breakfast. 05/17/14  Yes Biagio Borg, MD  glucose blood (FREESTYLE TEST STRIPS) test strip Use as instructed 06/04/12  Yes Biagio Borg, MD  meloxicam (MOBIC) 15 MG tablet Take 1 tablet (15 mg total) by mouth daily. 03/06/14  Yes Zenaida Niece, PA-C  pregabalin (LYRICA) 200 MG capsule  Take 200 mg by mouth 2 (two) times daily.   Yes Historical Provider, MD  atorvastatin (LIPITOR) 10 MG tablet Take 10 mg by mouth daily.    Historical Provider, MD  clonazePAM (KLONOPIN) 0.5 MG tablet Take 1 tablet (0.5 mg total) by mouth 2 (two) times daily as needed for anxiety. 06/28/14   Theodis Blaze, MD  colchicine 0.6 MG tablet Take 1 tablet (0.6 mg total) by mouth 2 (two) times daily. 03/27/14   Lyndal Pulley, DO  hydrALAZINE (APRESOLINE) 10 MG tablet Take 1 tablet (10 mg  total) by mouth 3 (three) times daily. 06/28/14   Theodis Blaze, MD  levofloxacin (LEVAQUIN) 500 MG tablet Take 1 tablet (500 mg total) by mouth daily. 06/28/14   Theodis Blaze, MD  methylphenidate 54 MG PO CR tablet Take 54 mg by mouth 2 (two) times daily.    Historical Provider, MD  metoCLOPramide (REGLAN) 5 MG tablet Take 1 tablet (5 mg total) by mouth 4 (four) times daily -  before meals and at bedtime. 06/28/14   Theodis Blaze, MD  NUCYNTA 100 MG TABS Take 1 tablet by mouth 3 (three) times daily.  03/24/14   Historical Provider, MD  ondansetron (ZOFRAN) 4 MG tablet Take 1 tablet (4 mg total) by mouth 2 (two) times daily. 06/28/14   Theodis Blaze, MD  ondansetron (ZOFRAN) 4 MG tablet Take 1 tablet (4 mg total) by mouth every 8 (eight) hours as needed for nausea or vomiting. 06/28/14   Theodis Blaze, MD  oxyCODONE-acetaminophen (PERCOCET) 7.5-325 MG per tablet Take 1 tablet by mouth every 6 (six) hours as needed for pain. 06/28/14   Theodis Blaze, MD  pantoprazole (PROTONIX) 40 MG tablet Take 1 tablet (40 mg total) by mouth daily. 06/28/14   Theodis Blaze, MD   BP 142/78 mmHg  Pulse 99  Temp(Src) 98.3 F (36.8 C) (Oral)  Resp 20  SpO2 100% Physical Exam  Constitutional: He is oriented to person, place, and time. He appears well-developed and well-nourished. No distress.  Laying on bed. No acute distress. Seems tired, but not toxic. Changing position on the stretcher without apparent difficulty.  HENT:  Head: Normocephalic and atraumatic.  Eyes: Conjunctivae are normal. Right eye exhibits no discharge. Left eye exhibits no discharge.  Neck: Neck supple.  Cardiovascular: Normal rate, regular rhythm and normal heart sounds.  Exam reveals no gallop and no friction rub.   No murmur heard. Pulmonary/Chest: Effort normal and breath sounds normal. No respiratory distress.  Abdominal: Soft. He exhibits no distension. There is no tenderness.  Musculoskeletal: He exhibits no edema or tenderness.   Back pain is not reproducible palpation. No concerning skin lesions noted.  Neurological: He is alert and oriented to person, place, and time. No cranial nerve deficit. Coordination normal.  Speech is clear, but monotone/flat affect. Cranial nerves are intact. Patient is globally weak. This seems more effort related than true neurological deficit though. No focally worse weakness. Sensation is intact to light touch.  Skin: Skin is warm and dry.  Psychiatric: His behavior is normal. Thought content normal.  Patient has a flat affect. Poor eye contact. He generally seems disengaged/disinterested. Tries to defer a lot of questioning to his partner who is at bedside  Nursing note and vitals reviewed.   ED Course  Procedures (including critical care time) Labs Review Labs Reviewed  BASIC METABOLIC PANEL - Abnormal; Notable for the following:    Glucose, Bld 131 (*)  All other components within normal limits  CBC WITH DIFFERENTIAL    Imaging Review No results found.   Dg Chest 2 View  06/17/2014   CLINICAL DATA:  Chest pain radiating through to the back for 3 days. History of hypertension. Diabetic.  EXAM: CHEST  2 VIEW  COMPARISON:  03/12/2010  FINDINGS: Normal heart, mediastinum hila.  Lungs are clear.  No pleural effusion or pneumothorax.  Bony thorax is demineralized. Mild degenerative changes noted along the mid thoracic spine.  IMPRESSION: No active cardiopulmonary disease.   Electronically Signed   By: Lajean Manes M.D.   On: 06/17/2014 19:17   Dg Abd 1 View  06/21/2014   CLINICAL DATA:  Constipation.  Generalized abdominal pain.  EXAM: ABDOMEN - 1 VIEW  COMPARISON:  06/17/2014  FINDINGS: Moderate stool burden throughout the colon. Nonobstructive bowel gas pattern. No free air. No organomegaly or suspicious calcification. Visualized lung bases are clear.  IMPRESSION: Moderate stool burden.  No acute findings.   Electronically Signed   By: Rolm Baptise M.D.   On: 06/21/2014 21:11    Ct Head Wo Contrast  06/17/2014   CLINICAL DATA:  Pt c/o headache since Thursday, no injury, blurred vision, pt poor historian, seems confused, unable to provide detailed hx  EXAM: CT HEAD WITHOUT CONTRAST  TECHNIQUE: Contiguous axial images were obtained from the base of the skull through the vertex without intravenous contrast.  COMPARISON:  None.  FINDINGS: Ventricles are normal in configuration. There is ventricular and sulcal enlargement reflecting mild age related volume loss. No hydrocephalus. No parenchymal masses or mass effect. There is no evidence of an infarct. Minor periventricular white matter hypoattenuation is noted consistent with chronic microvascular ischemic change.  There are no extra-axial masses or abnormal fluid collections.  No intracranial hemorrhage.  Visualized sinuses and mastoid air cells are clear.  IMPRESSION: 1. No acute intracranial abnormalities. 2. Age related volume loss and minor chronic microvascular ischemic change.   Electronically Signed   By: Lajean Manes M.D.   On: 06/17/2014 20:35   Ct Angio Abdomen W/cm &/or Wo Contrast  06/17/2014   CLINICAL DATA:  Mid thoracic back pain.  EXAM: CT ANGIOGRAPHY CHEST, ABDOMEN AND PELVIS  TECHNIQUE: Multidetector CT imaging through the chest, abdomen and pelvis was performed using the standard protocol during bolus administration of intravenous contrast. Multiplanar reconstructed images and MIPs were obtained and reviewed to evaluate the vascular anatomy.  COMPARISON:  Radiograph 06/17/2004  FINDINGS: CTA CHEST FINDINGS  Non IV contrast images demonstrate no intramural hematoma within the thoracic aorta.  Contrast images demonstrate no contour abnormality of the aorta to suggest dissection or aneurysm. The great vessels are normal. No central pulmonary embolism is present. No pericardial fluid.  Review of the lung parenchyma demonstrates no pneumothorax. No pleural fluid or infiltrate. No axillary or supraclavicular adenopathy.  No mediastinal adenopathy. Esophagus is normal. No mediastinal gas.  Review of the MIP images confirms the above findings.  CTA ABDOMEN AND PELVIS FINDINGS  There is no aneurysm or dissection of the abdominal aorta or iliac arteries. Mild intimal calcification of the abdominal aorta. The superior mesenteric artery is narrowed at its origin. The SMA is widely patent. There are 2 renal arteries on the left and 2 renal arteries on the right. IMA is patent.  No focal hepatic lesion. Gallbladder, pancreas, spleen, adrenal glands normal. Kidneys are normal with no obstruction of the ureters. The bladder is normal.  Stomach, small bowel, appendix, cecum normal. The colon and  rectosigmoid colon are normal.  No free fluid the pelvis. The prostate gland and bladder normal. No aggressive osseous lesion or acute lesion in the skeleton.  Review of the MIP images confirms the above findings.  IMPRESSION: Chest Impression:  1. No thoracic aortic aneurysm or dissection. 2. No pulmonary embolism. 3. No pneumothorax. 4. No mediastinal gas.  Abdomen / Pelvis Impression:  1. No evidence of abdominal aortic aneurysm or dissection. 2. Mild atherosclerotic calcification aorta. 3. No acute findings in the abdomen or pelvis. 4. No evidence of fracture.   Electronically Signed   By: Suzy Bouchard M.D.   On: 06/17/2014 21:03   Mr Thoracic Spine Wo Contrast  06/22/2014   CLINICAL DATA:  Back pain after a lifting injury. This occurred several days ago. Initial encounter.  EXAM: MRI THORACIC SPINE WITHOUT CONTRAST  TECHNIQUE: Multiplanar, multisequence MR imaging of the thoracic spine was performed. No intravenous contrast was administered.  COMPARISON:  CT chest 06/17/2014.  FINDINGS: Segmentation: Normal. The thoracic spine was labeled by counting down from the odontoid. There is agreement with numbering scheme used for the lumbar MRI.  Alignment:  Normal.  Vertebrae: No worrisome osseous lesions. Minor Schmorl's nodes without  compression deformity. Exuberant osteophytic spurring anteriorly and to the RIGHT, commonly seen in this age group.  Spinal cord: Normal in size and signal except for T7-8, described below. No intraspinal masses.  Paraspinal tissues: No worrisome extra-spinal abnormalities.  Disc levels:  The thoracic disc levels are within normal limits except for T7-8, where there is a LEFT paracentral extrusion. This disc herniation effaces the anterior subarachnoid space and mildly flattens the cord, as seen on image 23 series 8. Again, no abnormal cord signal. No other areas of significant thoracic disc pathology.  IMPRESSION: LEFT paracentral disc extrusion at T7-8. Effacement of the anterior subarachnoid space and mild cord flattening. No abnormal cord signal.  No compression deformity or other a worrisome finding.   Electronically Signed   By: Rolla Flatten M.D.   On: 06/22/2014 18:19   Mr Lumbar Spine Wo Contrast  06/20/2014   CLINICAL DATA:  Low back pain radiating into both legs, worse on the right. Evaluate for disc herniation. Initial encounter  EXAM: MRI LUMBAR SPINE WITHOUT CONTRAST  TECHNIQUE: Multiplanar, multisequence MR imaging of the lumbar spine was performed. No intravenous contrast was administered.  COMPARISON:  CTs of the chest, abdomen and pelvis 06/17/2014.  FINDINGS: There is transitional lumbosacral anatomy. As correlated with prior CT, there are small ribs at T12 and a partially sacralized L5 segment. The alignment is normal. There is no evidence of fracture or pars defect. There are scattered type 2 endplate degenerative changes, greatest at L1-2 and L3-4.  The conus medullaris extends to the T12-L1 level and appears normal. No paraspinal abnormalities are identified.  There are small anterior osteophytes within the lower thoracic spine, but no disc herniation, spinal stenosis or nerve root encroachment.  L1-2: Mild disc bulging with anterior osteophytes and mild facet hypertrophy. No spinal  stenosis or nerve root encroachment.  L2-3:  Normal interspace.  L3-4: There is mild loss of disc height with annular bulging and endplate degeneration. There is mild facet and ligamentous hypertrophy. These factors contribute to mild lateral recess and foraminal stenosis bilaterally. There is no focal disc herniation or definite nerve root encroachment.  L4-5: Disc height is relatively maintained. There is mild disc bulging asymmetric to the right and mild bilateral facet hypertrophy. There is minimal narrowing of the right foramen  without definite nerve root encroachment.  L5-S1: Axial images were not obtained through this vestigial disc space. The disc space appears fused. There is no spinal stenosis or nerve root encroachment.  IMPRESSION: 1. Transitional lumbosacral anatomy.  L5 is largely sacralized. 2. Mild lumbar spine degenerative changes, greatest at L3-4 where there is annular disc bulging, facet and ligamentous hypertrophy contributing to mild lateral recess and foraminal stenosis bilaterally. 3. No focal disc herniation or definite nerve root encroachment.   Electronically Signed   By: Camie Patience M.D.   On: 06/20/2014 10:22   Mr Ankle Right  Wo Contrast  06/10/2014   CLINICAL DATA:  Nondisplaced fracture of the lateral malleolus. Continued pain and swelling. Subsequent encounter.  EXAM: MRI OF THE RIGHT ANKLE WITHOUT CONTRAST  TECHNIQUE: Multiplanar, multisequence MR imaging of the ankle was performed. No intravenous contrast was administered.  COMPARISON:  05/19/2014.  FINDINGS: TENDONS  Peroneal: Tenosynovitis, likely reactive. The tendons are intact. No entrapment.  Posteromedial: Intact.  Anterior: Intact.  Achilles: Intact.  Plantar Fascia: Normal.  LIGAMENTS  Lateral: Edematous syndesmotic membrane secondary to lateral malleolar fracture. No tear of the syndesmotic membrane. Anterior tibiofibular ligament appears intact. Posterior tibiofibular ligament is edematous secondary to fracture.  Amorphous and edematous appearance of the anterior talofibular ligament . Posterior talofibular ligament appears similar. Calcaneofibular ligament does appear intact.  Medial: Intact.  CARTILAGE  Ankle Joint: Large effusion. Small area of retracted clot in the anterior lateral gutter. Focal grade III chondral fissure is present in the lateral tibial plafond anteriorly with radiating subchondral marrow edema.  Subtalar Joints/Sinus Tarsi: Mild edema.  Bones: Minimally displaced fracture the tip of the lateral malleolus. Distraction is 3 mm. Medial malleolar contusion is present.  IMPRESSION: 1. Minimally displaced lateral malleolar fracture. 2. Grade III chondral fissure in the lateral tibial plafond with radiating subchondral marrow edema. 3. Large ankle effusion with retracted clot from previous hemarthrosis. 4. Reactive tenosynovitis of the peroneal tendons. 5. Posterior tibiofibular ligament sprain. Anterior and posterior talofibular ligament sprain.   Electronically Signed   By: Dereck Ligas M.D.   On: 06/10/2014 12:30   Dg Chest Port 1 View  06/24/2014   CLINICAL DATA:  58 year old male with leukocytosis.  EXAM: PORTABLE CHEST - 1 VIEW  COMPARISON:  06/17/2014 and prior chest radiographs dating back to 04/23/2006  FINDINGS: Left lower lung airspace opacities are suspicious for pneumonia or aspiration.  Mild bibasilar atelectasis is noted is low volume film.  The cardiomediastinal silhouette is unremarkable.  There is no evidence of pulmonary edema, suspicious pulmonary nodule/mass, pleural effusion, or pneumothorax. No acute bony abnormalities are identified.  IMPRESSION: Left lower lung airspace opacities suspicious for pneumonia or aspiration.  Low volume film with mild bibasilar atelectasis.   Electronically Signed   By: Hassan Rowan M.D.   On: 06/24/2014 13:51   Ct Angio Chest Aorta W/cm &/or Wo/cm  06/17/2014   CLINICAL DATA:  Mid thoracic back pain.  EXAM: CT ANGIOGRAPHY CHEST, ABDOMEN AND PELVIS   TECHNIQUE: Multidetector CT imaging through the chest, abdomen and pelvis was performed using the standard protocol during bolus administration of intravenous contrast. Multiplanar reconstructed images and MIPs were obtained and reviewed to evaluate the vascular anatomy.  COMPARISON:  Radiograph 06/17/2004  FINDINGS: CTA CHEST FINDINGS  Non IV contrast images demonstrate no intramural hematoma within the thoracic aorta.  Contrast images demonstrate no contour abnormality of the aorta to suggest dissection or aneurysm. The great vessels are normal. No central pulmonary embolism is present.  No pericardial fluid.  Review of the lung parenchyma demonstrates no pneumothorax. No pleural fluid or infiltrate. No axillary or supraclavicular adenopathy. No mediastinal adenopathy. Esophagus is normal. No mediastinal gas.  Review of the MIP images confirms the above findings.  CTA ABDOMEN AND PELVIS FINDINGS  There is no aneurysm or dissection of the abdominal aorta or iliac arteries. Mild intimal calcification of the abdominal aorta. The superior mesenteric artery is narrowed at its origin. The SMA is widely patent. There are 2 renal arteries on the left and 2 renal arteries on the right. IMA is patent.  No focal hepatic lesion. Gallbladder, pancreas, spleen, adrenal glands normal. Kidneys are normal with no obstruction of the ureters. The bladder is normal.  Stomach, small bowel, appendix, cecum normal. The colon and rectosigmoid colon are normal.  No free fluid the pelvis. The prostate gland and bladder normal. No aggressive osseous lesion or acute lesion in the skeleton.  Review of the MIP images confirms the above findings.  IMPRESSION: Chest Impression:  1. No thoracic aortic aneurysm or dissection. 2. No pulmonary embolism. 3. No pneumothorax. 4. No mediastinal gas.  Abdomen / Pelvis Impression:  1. No evidence of abdominal aortic aneurysm or dissection. 2. Mild atherosclerotic calcification aorta. 3. No acute findings  in the abdomen or pelvis. 4. No evidence of fracture.   Electronically Signed   By: Suzy Bouchard M.D.   On: 06/17/2014 21:03     EKG Interpretation None      MDM   Final diagnoses:  Lower back pain    [age]-year-old male with lower back pain. Symptoms seem out of proportion to actual exam. Does seem globally weak. Suspect more effort related than true neurological deficit. Did obtain MRI given the degree of pain patient reported painand his neurological complaints. MRI was fairly unremarkable. Despite his pain complaints he is changing position on the stretcher without apparent difficulty. Suspect there is some degree of depression although patient denies. Objectively there does not appear to be in acute emergent process. This seems more of a pain control issue than anything else. Patient is currently taking Nucynta. Will prescribe Percocet, patient instructed to try one or the other but not both because of the potentially further sedating effects. Recommending pain management follow-up    Virgel Manifold, MD 06/29/14 [phone]

## 2014-06-29 NOTE — ED Provider Notes (Signed)
CSN: [id number]     Arrival date & time 06/29/14  0522 History   First MD Initiated Contact with Patient 06/29/14 [phone]     Chief Complaint  Patient presents with  . Back Pain    The patient was admitted to [facility], admitted, treated and released to [facility].     (Consider location/radiation/quality/duration/timing/severity/associated sxs/prior Treatment) HPI Patient presents to the emergency department with complaints of back pain which is a chronic problem for the patient.  The patient was just discharged from the hospital to an assisted living facility yesterday.  He states they would not give him any pain medications at the facility.  The patient states that he had a recent workup for his back pain and he has some disc issues.  Patient denies chest pain, shortness of breath, fever, weakness, dizziness, headache, blurred vision, cough, rhinorrhea, sore throat, lightheadedness, abdominal pain, dysuria, incontinence, or syncope.  The patient states that palpation makes the pain worse along with movement. Past Medical History  Diagnosis Date  . COLONIC POLYPS 02/01/2008  . DIABETES MELLITUS, TYPE II 05/20/2010  . HYPERLIPIDEMIA 10/09/2008  . ANXIETY DEPRESSION 02/01/2008  . ERECTILE DYSFUNCTION 10/09/2008  . ADD 10/09/2008  . SLEEP APNEA, OBSTRUCTIVE 02/01/2008  . MORTON'S NEUROMA 05/20/2010  . PERIPHERAL NEUROPATHY 05/20/2010  . Other specified forms of hearing loss 06/27/2009  . HYPERTENSION 10/09/2008  . HEMORRHOIDS 02/01/2008  . ALLERGIC RHINITIS 10/09/2008  . Stricture and stenosis of esophagus 02/02/2008  . GERD 02/01/2008  . HIATAL HERNIA 02/01/2008  . ERECTILE DYSFUNCTION, ORGANIC 05/20/2010  . WRIST PAIN, LEFT 12/05/2009  . FOOT PAIN, LEFT 05/20/2010  . PERIPHERAL EDEMA 05/20/2010  . DYSPNEA 03/12/2010  . Abdominal pain, unspecified site 01/19/2009  . Abdominal pain, left lower quadrant 06/06/2010  . Type II or unspecified type diabetes mellitus without mention of complication,  uncontrolled 11/14/2010   Past Surgical History  Procedure Laterality Date  . Carpal tunnel release    . Rotator cuff repair    . Esophageal dilation  july 2009  . Eye surgery    . Esophagogastroduodenoscopy N/A 06/27/2014    Procedure: ESOPHAGOGASTRODUODENOSCOPY (EGD);  Surgeon: Lafayette Dragon, MD;  Location: [facility];  Service: Endoscopy;  Laterality: N/A;   Family History  Problem Relation Age of Onset  . Diabetes Mother   . Heart disease Mother   . Hyperlipidemia Mother   . Depression Mother   . Diabetes Brother   . Colon cancer Neg Hx    History  Substance Use Topics  . Smoking status: Never Smoker   . Smokeless tobacco: Never Used  . Alcohol Use: Yes     Comment: occasional    Review of Systems  All other systems negative except as documented in the HPI. All pertinent positives and negatives as reviewed in the HPI.  Allergies  Codeine and Metformin and related  Home Medications   Prior to Admission medications   Medication Sig Start Date End Date Taking? Authorizing Provider  allopurinol (ZYLOPRIM) 100 MG tablet Take 2 tablets (200 mg total) by mouth daily. 03/27/14  Yes Lyndal Pulley, DO  amLODipine (NORVASC) 10 MG tablet Take 1 tablet (10 mg total) by mouth daily. 03/27/14  Yes Lyndal Pulley, DO  atorvastatin (LIPITOR) 10 MG tablet Take 10 mg by mouth daily.   Yes Historical Provider, MD  clonazePAM (KLONOPIN) 0.5 MG tablet Take 1 tablet (0.5 mg total) by mouth 2 (two) times daily as needed for anxiety. 06/28/14  Yes Angeline Slim  Doyle Askew, MD  colchicine 0.6 MG tablet Take 1 tablet (0.6 mg total) by mouth 2 (two) times daily. 03/27/14  Yes Lyndal Pulley, DO  DULoxetine (CYMBALTA) 30 MG capsule Take 30 mg by mouth daily.    Yes Historical Provider, MD  glipiZIDE (GLUCOTROL XL) 2.5 MG 24 hr tablet Take 1 tablet (2.5 mg total) by mouth daily with breakfast. 05/17/14  Yes Biagio Borg, MD  hydrALAZINE (APRESOLINE) 10 MG tablet Take 1 tablet (10 mg total) by mouth 3  (three) times daily. 06/28/14  Yes Theodis Blaze, MD  levofloxacin (LEVAQUIN) 500 MG tablet Take 1 tablet (500 mg total) by mouth daily. 06/28/14  Yes Theodis Blaze, MD  meloxicam (MOBIC) 15 MG tablet Take 1 tablet (15 mg total) by mouth daily. 03/06/14  Yes Zenaida Niece, PA-C  methylphenidate 54 MG PO CR tablet Take 54 mg by mouth 2 (two) times daily.   Yes Historical Provider, MD  metoCLOPramide (REGLAN) 5 MG tablet Take 1 tablet (5 mg total) by mouth 4 (four) times daily -  before meals and at bedtime. 06/28/14  Yes Theodis Blaze, MD  NUCYNTA 100 MG TABS Take 1 tablet by mouth 3 (three) times daily.  03/24/14  Yes Historical Provider, MD  pantoprazole (PROTONIX) 40 MG tablet Take 1 tablet (40 mg total) by mouth daily. 06/28/14  Yes Theodis Blaze, MD  pregabalin (LYRICA) 200 MG capsule Take 200 mg by mouth 2 (two) times daily.   Yes Historical Provider, MD  glucose blood (FREESTYLE TEST STRIPS) test strip Use as instructed 06/04/12   Biagio Borg, MD  ondansetron (ZOFRAN) 4 MG tablet Take 1 tablet (4 mg total) by mouth 2 (two) times daily. 06/28/14   Theodis Blaze, MD  ondansetron (ZOFRAN) 4 MG tablet Take 1 tablet (4 mg total) by mouth every 8 (eight) hours as needed for nausea or vomiting. 06/28/14   Theodis Blaze, MD  oxyCODONE-acetaminophen (PERCOCET) 7.5-325 MG per tablet Take 1 tablet by mouth every 6 (six) hours as needed for pain. 06/28/14   Theodis Blaze, MD   BP 173/104 mmHg  Pulse 98  Temp(Src) 97.9 F (36.6 C) (Oral)  Resp 12  SpO2 93% Physical Exam  Constitutional: He is oriented to person, place, and time. He appears well-developed and well-nourished. No distress.  HENT:  Head: Normocephalic and atraumatic.  Mouth/Throat: Oropharynx is clear and moist.  Eyes: Pupils are equal, round, and reactive to light.  Neck: Normal range of motion. Neck supple.  Cardiovascular: Normal rate, regular rhythm and normal heart sounds.  Exam reveals no gallop and no friction rub.   No  murmur heard. Pulmonary/Chest: Effort normal and breath sounds normal. No respiratory distress.  Abdominal: Soft. Bowel sounds are normal. He exhibits no distension. There is no tenderness.  Musculoskeletal:       Thoracic back: He exhibits tenderness. He exhibits no deformity.       Lumbar back: He exhibits tenderness. He exhibits no deformity.  Neurological: He is alert and oriented to person, place, and time. He exhibits normal muscle tone. Coordination normal.  Skin: Skin is warm and dry. No rash noted. No erythema.    ED Course  Procedures (including critical care time) Labs Review Labs Reviewed  BASIC METABOLIC PANEL - Abnormal; Notable for the following:    Glucose, Bld 142 (*)    BUN 25 (*)    All other components within normal limits  URINALYSIS, ROUTINE W REFLEX MICROSCOPIC -  Abnormal; Notable for the following:    APPearance CLOUDY (*)    Hgb urine dipstick TRACE (*)    Ketones, ur 15 (*)    All other components within normal limits  URINE MICROSCOPIC-ADD ON - Abnormal; Notable for the following:    Bacteria, UA FEW (*)    All other components within normal limits  URINE CULTURE  CBC WITH DIFFERENTIAL  PRO B NATRIURETIC PEPTIDE  I-STAT TROPOININ, ED    I reviewed the patient's admission notes and MRIs.  The patient is advised that he has a prescription for pain medication, which needs to be utilized as well as at the facility told to return here as needed.  MDM   Final diagnoses:  None       Brent General, PA-C 06/29/14 1606  Johnna Acosta, MD 06/29/14 2037

## 2014-06-29 NOTE — ED Notes (Signed)
Pt complaining of splitting back pain, pt states "feels as if my back is gonna break off"

## 2014-06-29 NOTE — ED Notes (Signed)
Pt complaining of back pain with itching. Rash noted on back.

## 2014-06-29 NOTE — Discharge Instructions (Signed)
Return here as needed. Follow up with your doctor. You have a prescription from when you were discharged for pain medication that needs to be administered

## 2014-06-29 NOTE — Discharge Planning (Signed)
Alerted by [facility] for potential readmission.  Pt returned to [facility] via [facility].

## 2014-06-29 NOTE — ED Notes (Signed)
Danelle Berry, RN called report to [employer]. Pt adamant about wanting his partner to take [facility]. Pt's partner felt comfortable transporting pt. Pt states he could stand and pivot. 2 RN's help pt to wheelchair and wheel pt to partner's car for d/c. Once staff arrived at car to place pt in car, pt unable to support weight and staff unable to get him in car. Brought pt back to ED room, called [facility] to transport. Pt's partner and pt are agreeable.

## 2014-06-29 NOTE — ED Notes (Signed)
The patient was admitted to [facility] for pneumonia and he was discharged today.  Patient went to [facility] and started having SOB and back pain so personnel called EMS. EMS advised his lungs were clear, however they placed him on oxygen and transported him here.

## 2014-06-29 NOTE — Progress Notes (Signed)
[facility] ED CM notified of pt return to [facility] ED system. Pt found to be at [facility]. Spoke with [facility] ED CM about following pt for potential re admission

## 2014-06-29 NOTE — ED Notes (Signed)
Pt's partner at bedside and updated pt and partner on plan of care.

## 2014-06-29 NOTE — ED Notes (Signed)
[facility] present and taking pt to [facility].

## 2014-06-30 ENCOUNTER — Emergency Department (HOSPITAL_COMMUNITY): Payer: BC Managed Care – PPO

## 2014-06-30 ENCOUNTER — Encounter (HOSPITAL_COMMUNITY): Payer: Self-pay

## 2014-06-30 ENCOUNTER — Inpatient Hospital Stay (HOSPITAL_COMMUNITY): Payer: BC Managed Care – PPO

## 2014-06-30 ENCOUNTER — Inpatient Hospital Stay (HOSPITAL_COMMUNITY)
Admission: EM | Admit: 2014-06-30 | Discharge: 2014-07-11 | DRG: 207 | Payer: BC Managed Care – PPO | Attending: Internal Medicine | Admitting: Internal Medicine

## 2014-06-30 DIAGNOSIS — A047 Enterocolitis due to Clostridium difficile: Secondary | ICD-10-CM | POA: Diagnosis present

## 2014-06-30 DIAGNOSIS — E1142 Type 2 diabetes mellitus with diabetic polyneuropathy: Secondary | ICD-10-CM | POA: Diagnosis present

## 2014-06-30 DIAGNOSIS — J9620 Acute and chronic respiratory failure, unspecified whether with hypoxia or hypercapnia: Secondary | ICD-10-CM

## 2014-06-30 DIAGNOSIS — J9601 Acute respiratory failure with hypoxia: Secondary | ICD-10-CM | POA: Diagnosis present

## 2014-06-30 DIAGNOSIS — E785 Hyperlipidemia, unspecified: Secondary | ICD-10-CM | POA: Diagnosis present

## 2014-06-30 DIAGNOSIS — I10 Essential (primary) hypertension: Secondary | ICD-10-CM | POA: Diagnosis present

## 2014-06-30 DIAGNOSIS — Z79899 Other long term (current) drug therapy: Secondary | ICD-10-CM

## 2014-06-30 DIAGNOSIS — R0602 Shortness of breath: Secondary | ICD-10-CM

## 2014-06-30 DIAGNOSIS — G4733 Obstructive sleep apnea (adult) (pediatric): Secondary | ICD-10-CM | POA: Diagnosis present

## 2014-06-30 DIAGNOSIS — F418 Other specified anxiety disorders: Secondary | ICD-10-CM | POA: Diagnosis present

## 2014-06-30 DIAGNOSIS — E43 Unspecified severe protein-calorie malnutrition: Secondary | ICD-10-CM | POA: Diagnosis not present

## 2014-06-30 DIAGNOSIS — R1311 Dysphagia, oral phase: Secondary | ICD-10-CM | POA: Diagnosis present

## 2014-06-30 DIAGNOSIS — D649 Anemia, unspecified: Secondary | ICD-10-CM | POA: Diagnosis present

## 2014-06-30 DIAGNOSIS — H9190 Unspecified hearing loss, unspecified ear: Secondary | ICD-10-CM | POA: Diagnosis present

## 2014-06-30 DIAGNOSIS — G610 Guillain-Barre syndrome: Secondary | ICD-10-CM | POA: Diagnosis present

## 2014-06-30 DIAGNOSIS — J9602 Acute respiratory failure with hypercapnia: Secondary | ICD-10-CM | POA: Diagnosis present

## 2014-06-30 DIAGNOSIS — R1310 Dysphagia, unspecified: Secondary | ICD-10-CM | POA: Diagnosis present

## 2014-06-30 DIAGNOSIS — K219 Gastro-esophageal reflux disease without esophagitis: Secondary | ICD-10-CM | POA: Diagnosis present

## 2014-06-30 DIAGNOSIS — G6181 Chronic inflammatory demyelinating polyneuritis: Secondary | ICD-10-CM | POA: Diagnosis present

## 2014-06-30 DIAGNOSIS — Z888 Allergy status to other drugs, medicaments and biological substances status: Secondary | ICD-10-CM | POA: Diagnosis not present

## 2014-06-30 DIAGNOSIS — E1165 Type 2 diabetes mellitus with hyperglycemia: Secondary | ICD-10-CM | POA: Diagnosis present

## 2014-06-30 DIAGNOSIS — Z86010 Personal history of colonic polyps: Secondary | ICD-10-CM

## 2014-06-30 DIAGNOSIS — G839 Paralytic syndrome, unspecified: Secondary | ICD-10-CM | POA: Diagnosis present

## 2014-06-30 DIAGNOSIS — F22 Delusional disorders: Secondary | ICD-10-CM | POA: Diagnosis present

## 2014-06-30 DIAGNOSIS — M6281 Muscle weakness (generalized): Secondary | ICD-10-CM

## 2014-06-30 DIAGNOSIS — R52 Pain, unspecified: Secondary | ICD-10-CM

## 2014-06-30 DIAGNOSIS — E872 Acidosis: Secondary | ICD-10-CM | POA: Diagnosis present

## 2014-06-30 DIAGNOSIS — J9600 Acute respiratory failure, unspecified whether with hypoxia or hypercapnia: Secondary | ICD-10-CM | POA: Diagnosis present

## 2014-06-30 DIAGNOSIS — Z9289 Personal history of other medical treatment: Secondary | ICD-10-CM | POA: Diagnosis present

## 2014-06-30 DIAGNOSIS — Z4659 Encounter for fitting and adjustment of other gastrointestinal appliance and device: Secondary | ICD-10-CM | POA: Diagnosis not present

## 2014-06-30 DIAGNOSIS — A0472 Enterocolitis due to Clostridium difficile, not specified as recurrent: Secondary | ICD-10-CM | POA: Diagnosis present

## 2014-06-30 DIAGNOSIS — R531 Weakness: Secondary | ICD-10-CM

## 2014-06-30 LAB — POCT I-STAT 3, ART BLOOD GAS (G3+)
ACID-BASE EXCESS: 6 mmol/L — AB (ref 0.0–2.0)
Bicarbonate: 31.4 mEq/L — ABNORMAL HIGH (ref 20.0–24.0)
O2 SAT: 100 %
PCO2 ART: 46.6 mmHg — AB (ref 35.0–45.0)
TCO2: 33 mmol/L (ref 0–100)
pH, Arterial: 7.436 (ref 7.350–7.450)
pO2, Arterial: 354 mmHg — ABNORMAL HIGH (ref 80.0–100.0)

## 2014-06-30 LAB — CBC
HEMATOCRIT: 38.9 % — AB (ref 39.0–52.0)
Hemoglobin: 13.9 g/dL (ref 13.0–17.0)
MCH: 31.1 pg (ref 26.0–34.0)
MCHC: 35.7 g/dL (ref 30.0–36.0)
MCV: 87 fL (ref 78.0–100.0)
PLATELETS: 245 10*3/uL (ref 150–400)
RBC: 4.47 MIL/uL (ref 4.22–5.81)
RDW: 12.6 % (ref 11.5–15.5)
WBC: 7.4 10*3/uL (ref 4.0–10.5)

## 2014-06-30 LAB — POCT I-STAT, CHEM 8
BUN: 31 mg/dL — ABNORMAL HIGH (ref 6–23)
CALCIUM ION: 1.21 mmol/L (ref 1.13–1.30)
CHLORIDE: 101 meq/L (ref 96–112)
Creatinine, Ser: 1.1 mg/dL (ref 0.50–1.35)
Glucose, Bld: 120 mg/dL — ABNORMAL HIGH (ref 70–99)
HEMATOCRIT: 40 % (ref 39.0–52.0)
Hemoglobin: 13.6 g/dL (ref 13.0–17.0)
Potassium: 3.3 mEq/L — ABNORMAL LOW (ref 3.7–5.3)
SODIUM: 141 meq/L (ref 137–147)
TCO2: 27 mmol/L (ref 0–100)

## 2014-06-30 LAB — GLUCOSE, CSF: Glucose, CSF: 83 mg/dL — ABNORMAL HIGH (ref 43–76)

## 2014-06-30 LAB — GRAM STAIN
Gram Stain: NONE SEEN
Special Requests: NORMAL

## 2014-06-30 LAB — CREATININE, SERUM
Creatinine, Ser: 0.86 mg/dL (ref 0.50–1.35)
GFR calc non Af Amer: >90 mL/min (ref >90)

## 2014-06-30 LAB — GLUCOSE, CAPILLARY
Glucose-Capillary: 115 mg/dL — ABNORMAL HIGH (ref 70–99)
Glucose-Capillary: 122 mg/dL — ABNORMAL HIGH (ref 70–99)
Glucose-Capillary: 124 mg/dL — ABNORMAL HIGH (ref 70–99)
Glucose-Capillary: 126 mg/dL — ABNORMAL HIGH (ref 70–99)
Glucose-Capillary: 148 mg/dL — ABNORMAL HIGH (ref 70–99)

## 2014-06-30 LAB — MRSA PCR SCREENING: MRSA BY PCR: NEGATIVE

## 2014-06-30 LAB — PROTEIN, CSF: Total  Protein, CSF: 114 mg/dL — ABNORMAL HIGH (ref 15–45)

## 2014-06-30 MED ORDER — MIDAZOLAM HCL 5 MG/ML IJ SOLN
1.0000 mg/h | INTRAMUSCULAR | Status: DC
  Administered 2014-06-30: 4 mg/h via INTRAVENOUS
  Administered 2014-07-01: 2 mg/h via INTRAVENOUS
  Filled 2014-06-30 (×3): qty 10

## 2014-06-30 MED ORDER — LABETALOL HCL 5 MG/ML IV SOLN
10.0000 mg | INTRAVENOUS | Status: DC | PRN
  Administered 2014-06-30: 10 mg via INTRAVENOUS

## 2014-06-30 MED ORDER — FAMOTIDINE IN NACL 20-0.9 MG/50ML-% IV SOLN
20.0000 mg | INTRAVENOUS | Status: DC
  Administered 2014-06-30 – 2014-07-06 (×8): 20 mg via INTRAVENOUS
  Filled 2014-06-30 (×8): qty 50

## 2014-06-30 MED ORDER — CETYLPYRIDINIUM CHLORIDE 0.05 % MT LIQD
7.0000 mL | Freq: Four times a day (QID) | OROMUCOSAL | Status: DC
  Administered 2014-07-01 – 2014-07-11 (×31): 7 mL via OROMUCOSAL

## 2014-06-30 MED ORDER — SODIUM CHLORIDE 0.9 % IV SOLN
25.0000 ug/h | INTRAVENOUS | Status: DC
  Administered 2014-06-30 – 2014-07-01 (×2): 100 ug/h via INTRAVENOUS
  Administered 2014-07-02: 150 ug/h via INTRAVENOUS
  Administered 2014-07-03 (×2): 200 ug/h via INTRAVENOUS
  Administered 2014-07-04: 100 ug/h via INTRAVENOUS
  Administered 2014-07-04: 300 ug/h via INTRAVENOUS
  Administered 2014-07-04: 200 ug/h via INTRAVENOUS
  Administered 2014-07-05: 250 ug/h via INTRAVENOUS
  Filled 2014-06-30 (×9): qty 50

## 2014-06-30 MED ORDER — SODIUM CHLORIDE 0.9 % IV SOLN
4.0000 g | Freq: Once | INTRAVENOUS | Status: AC
  Administered 2014-06-30: 4 g via INTRAVENOUS
  Filled 2014-06-30 (×3): qty 40

## 2014-06-30 MED ORDER — ACETAMINOPHEN 325 MG PO TABS: 650.0000 mg | ORAL_TABLET | ORAL | Status: DC | PRN

## 2014-06-30 MED ORDER — MIDAZOLAM HCL 5 MG/ML IJ SOLN: 4.0000 mg | Freq: Once | INTRAMUSCULAR | Status: AC

## 2014-06-30 MED ORDER — LABETALOL HCL 5 MG/ML IV SOLN
INTRAVENOUS | Status: AC
  Filled 2014-06-30: qty 4

## 2014-06-30 MED ORDER — PRO-STAT SUGAR FREE PO LIQD
30.0000 mL | Freq: Three times a day (TID) | ORAL | Status: DC
  Administered 2014-06-30 – 2014-07-04 (×14): 30 mL
  Filled 2014-06-30 (×21): qty 30

## 2014-06-30 MED ORDER — HYDROMORPHONE HCL 1 MG/ML IJ SOLN
1.0000 mg | Freq: Once | INTRAMUSCULAR | Status: AC
  Administered 2014-06-30: 1 mg via INTRAVENOUS
  Filled 2014-06-30: qty 1

## 2014-06-30 MED ORDER — FENTANYL CITRATE 0.05 MG/ML IJ SOLN
12.5000 ug | INTRAMUSCULAR | Status: DC | PRN
Start: 2014-06-30 — End: 2014-07-06
  Administered 2014-06-30 – 2014-07-06 (×5): 12.5 ug via INTRAVENOUS
  Filled 2014-06-30 (×5): qty 2

## 2014-06-30 MED ORDER — HEPARIN SODIUM (PORCINE) 1000 UNIT/ML IJ SOLN
1000.0000 [IU] | INTRAMUSCULAR | Status: DC | PRN
  Administered 2014-06-30: 2400 [IU] via INTRAVENOUS
  Filled 2014-06-30: qty 1

## 2014-06-30 MED ORDER — ACETAMINOPHEN 325 MG PO TABS: 650.0000 mg | ORAL_TABLET | Freq: Four times a day (QID) | ORAL | Status: DC | PRN

## 2014-06-30 MED ORDER — CALCIUM CARBONATE ANTACID 500 MG PO CHEW
2.0000 | CHEWABLE_TABLET | ORAL | Status: AC
  Filled 2014-06-30 (×2): qty 2

## 2014-06-30 MED ORDER — ACD FORMULA A 0.73-2.45-2.2 GM/100ML VI SOLN
500.0000 mL | Status: DC
  Administered 2014-06-30: 500 mL via INTRAVENOUS

## 2014-06-30 MED ORDER — ROCURONIUM BROMIDE 100 MG/10ML IV SOLN
50.0000 mg | Freq: Once | INTRAVENOUS | Status: AC
  Administered 2014-06-30: 50 mg via INTRAVENOUS

## 2014-06-30 MED ORDER — IMMUNE GLOBULIN (HUMAN) 5 GM/100ML IV SOLN
400.0000 mg/kg | INTRAVENOUS | Status: DC
  Filled 2014-06-30: qty 100

## 2014-06-30 MED ORDER — SODIUM CHLORIDE 0.9 % IV SOLN: 4.0000 g | Freq: Once | INTRAVENOUS | Status: DC

## 2014-06-30 MED ORDER — ACETAMINOPHEN 10 MG/ML IV SOLN
500.0000 mg | Freq: Once | INTRAVENOUS | Status: AC
  Administered 2014-06-30: 500 mg via INTRAVENOUS
  Filled 2014-06-30: qty 50

## 2014-06-30 MED ORDER — VITAL HIGH PROTEIN PO LIQD
1000.0000 mL | ORAL | Status: DC
  Filled 2014-06-30 (×2): qty 1000

## 2014-06-30 MED ORDER — DIPHENHYDRAMINE HCL 25 MG PO CAPS: 25.0000 mg | ORAL_CAPSULE | Freq: Four times a day (QID) | ORAL | Status: DC | PRN

## 2014-06-30 MED ORDER — HEPARIN SODIUM (PORCINE) 1000 UNIT/ML IJ SOLN: 1000.0000 [IU] | Freq: Once | INTRAMUSCULAR | Status: DC

## 2014-06-30 MED ORDER — LIDOCAINE HCL 2 % IJ SOLN
INTRAMUSCULAR | Status: AC
  Administered 2014-06-30: 04:00:00
  Filled 2014-06-30: qty 20

## 2014-06-30 MED ORDER — SODIUM CHLORIDE 0.9 % IV BOLUS (SEPSIS)
500.0000 mL | Freq: Once | INTRAVENOUS | Status: AC
  Administered 2014-06-30: 500 mL via INTRAVENOUS

## 2014-06-30 MED ORDER — FENTANYL CITRATE 0.05 MG/ML IJ SOLN
100.0000 ug | Freq: Once | INTRAMUSCULAR | Status: DC
  Filled 2014-06-30: qty 2

## 2014-06-30 MED ORDER — MIDAZOLAM HCL 2 MG/2ML IJ SOLN
INTRAMUSCULAR | Status: AC
Start: 2014-06-30 — End: 2014-06-30
  Administered 2014-06-30: 4 mg
  Filled 2014-06-30: qty 4

## 2014-06-30 MED ORDER — HYDROMORPHONE HCL 1 MG/ML IJ SOLN
1.0000 mg | Freq: Once | INTRAMUSCULAR | Status: AC
Start: 2014-06-30 — End: 2014-06-30
  Administered 2014-06-30: 1 mg via INTRAVENOUS
  Filled 2014-06-30: qty 1

## 2014-06-30 MED ORDER — ETOMIDATE 2 MG/ML IV SOLN
20.0000 mg | Freq: Once | INTRAVENOUS | Status: AC
  Administered 2014-06-30: 20 mg via INTRAVENOUS

## 2014-06-30 MED ORDER — VITAL AF 1.2 CAL PO LIQD
1000.0000 mL | ORAL | Status: DC
  Administered 2014-06-30 – 2014-07-03 (×4): 1000 mL
  Filled 2014-06-30 (×10): qty 1000

## 2014-06-30 MED ORDER — LIDOCAINE HCL 2 % IJ SOLN: 20.0000 mL | Freq: Once | INTRAMUSCULAR | Status: DC

## 2014-06-30 MED ORDER — ENOXAPARIN SODIUM 40 MG/0.4ML ~~LOC~~ SOLN
40.0000 mg | SUBCUTANEOUS | Status: DC
  Administered 2014-06-30 – 2014-07-11 (×12): 40 mg via SUBCUTANEOUS
  Filled 2014-06-30 (×13): qty 0.4

## 2014-06-30 MED ORDER — ANTICOAGULANT SODIUM CITRATE 4% (200MG/5ML) IV SOLN
5.0000 mL | Freq: Once | Status: AC
  Administered 2014-06-30: 2.4 mL
  Filled 2014-06-30 (×3): qty 250

## 2014-06-30 MED ORDER — FENTANYL CITRATE 0.05 MG/ML IJ SOLN
INTRAMUSCULAR | Status: AC
  Administered 2014-06-30: 100 ug
  Filled 2014-06-30: qty 2

## 2014-06-30 MED ORDER — ONDANSETRON HCL 4 MG/2ML IJ SOLN
4.0000 mg | Freq: Once | INTRAMUSCULAR | Status: AC
  Administered 2014-06-30: 4 mg via INTRAVENOUS
  Filled 2014-06-30: qty 2

## 2014-06-30 MED ORDER — ACD FORMULA A 0.73-2.45-2.2 GM/100ML VI SOLN
Status: AC
  Administered 2014-06-30: 500 mL via INTRAVENOUS
  Filled 2014-06-30: qty 500

## 2014-06-30 MED ORDER — ONDANSETRON HCL 4 MG/2ML IJ SOLN: 4.0000 mg | Freq: Four times a day (QID) | INTRAMUSCULAR | Status: DC | PRN

## 2014-06-30 MED ORDER — ALBUMIN HUMAN 25 % IV SOLN
Freq: Once | INTRAVENOUS | Status: DC
  Administered 2014-06-30: 18:00:00 via INTRAVENOUS_CENTRAL

## 2014-06-30 MED ORDER — DIPHENHYDRAMINE HCL 50 MG/ML IJ SOLN
25.0000 mg | Freq: Once | INTRAMUSCULAR | Status: AC
  Administered 2014-06-30: 25 mg via INTRAVENOUS
  Filled 2014-06-30: qty 1

## 2014-06-30 MED ORDER — ACD FORMULA A 0.73-2.45-2.2 GM/100ML VI SOLN
500.0000 mL | Status: DC
  Administered 2014-07-01 (×2): 500 mL via INTRAVENOUS
  Filled 2014-06-30: qty 500

## 2014-06-30 MED ORDER — ACETAMINOPHEN 650 MG RE SUPP: 650.0000 mg | Freq: Four times a day (QID) | RECTAL | Status: DC | PRN

## 2014-06-30 MED ORDER — SODIUM CHLORIDE 0.9 % IV SOLN
Freq: Once | INTRAVENOUS | Status: AC
  Administered 2014-06-30: 18:00:00 via INTRAVENOUS_CENTRAL
  Filled 2014-06-30 (×6): qty 200

## 2014-06-30 MED ORDER — CHLORHEXIDINE GLUCONATE 0.12 % MT SOLN: 15.0000 mL | Freq: Two times a day (BID) | OROMUCOSAL | Status: DC

## 2014-06-30 MED ORDER — CHLORHEXIDINE GLUCONATE 0.12 % MT SOLN
15.0000 mL | Freq: Two times a day (BID) | OROMUCOSAL | Status: DC
  Administered 2014-06-30 – 2014-07-10 (×18): 15 mL via OROMUCOSAL
  Filled 2014-06-30 (×15): qty 15

## 2014-06-30 MED ORDER — LABETALOL HCL 5 MG/ML IV SOLN
10.0000 mg | INTRAVENOUS | Status: DC | PRN
  Administered 2014-06-30 – 2014-07-07 (×9): 10 mg via INTRAVENOUS
  Filled 2014-06-30 (×8): qty 4

## 2014-06-30 NOTE — Procedures (Signed)
Intubation Procedure Note Frank [name] [id number] [birth year]  Procedure: Intubation Indications: Airway protection and maintenance  Procedure Details Consent: Risks of procedure as well as the alternatives and risks of each were explained to the (patient/caregiver).  Consent for procedure obtained. Time Out: Verified patient identification, verified procedure, site/side was marked, verified correct patient position, special equipment/implants available, medications/allergies/relevent history reviewed, required imaging and test results available.  Performed  Maximum sterile technique was used including gloves, gown and mask.  MAC and 3    Evaluation Hemodynamic Status: BP stable throughout; O2 sats: stable throughout Patient's Current Condition: stable Complications: No apparent complications Patient did tolerate procedure well. Chest X-ray ordered to verify placement.  CXR: pending.   Gonzella Lex 06/30/2014

## 2014-06-30 NOTE — ED Provider Notes (Signed)
CSN: [id number]     Arrival date & time 06/30/14  0027 History   First MD Initiated Contact with Patient 06/30/14 0043     Chief Complaint  Patient presents with  . Back Pain  . Abdominal Pain     (Consider location/radiation/quality/duration/timing/severity/associated sxs/prior Treatment) Patient is a 58 y.o. male presenting with back pain and abdominal pain. The history is provided by the patient.  Back Pain Location:  Lumbar spine Quality:  Aching Radiates to:  Does not radiate Pain severity:  Severe Pain is:  Same all the time Timing:  Constant Progression:  Worsening Chronicity:  Chronic Context: not falling and not recent illness   Relieved by:  Nothing Worsened by:  Nothing tried Ineffective treatments:  None tried Associated symptoms: abdominal pain, numbness and weakness   Associated symptoms: no bladder incontinence, no bowel incontinence and no fever   Weakness:    Severity:  Severe   Onset quality:  Gradual   Chronicity:  New   Timing:  Constant   Progression:  Worsening Risk factors comment:  Recent pneumonia, diabetic neuropathy Abdominal Pain Associated symptoms: shortness of breath   Associated symptoms: no fever     Past Medical History  Diagnosis Date  . COLONIC POLYPS 02/01/2008  . DIABETES MELLITUS, TYPE II 05/20/2010  . HYPERLIPIDEMIA 10/09/2008  . ANXIETY DEPRESSION 02/01/2008  . ERECTILE DYSFUNCTION 10/09/2008  . ADD 10/09/2008  . SLEEP APNEA, OBSTRUCTIVE 02/01/2008  . MORTON'S NEUROMA 05/20/2010  . PERIPHERAL NEUROPATHY 05/20/2010  . Other specified forms of hearing loss 06/27/2009  . HYPERTENSION 10/09/2008  . HEMORRHOIDS 02/01/2008  . ALLERGIC RHINITIS 10/09/2008  . Stricture and stenosis of esophagus 02/02/2008  . GERD 02/01/2008  . HIATAL HERNIA 02/01/2008  . ERECTILE DYSFUNCTION, ORGANIC 05/20/2010  . WRIST PAIN, LEFT 12/05/2009  . FOOT PAIN, LEFT 05/20/2010  . PERIPHERAL EDEMA 05/20/2010  . DYSPNEA 03/12/2010  . Abdominal pain, unspecified site  01/19/2009  . Abdominal pain, left lower quadrant 06/06/2010  . Type II or unspecified type diabetes mellitus without mention of complication, uncontrolled 11/14/2010   Past Surgical History  Procedure Laterality Date  . Carpal tunnel release    . Rotator cuff repair    . Esophageal dilation  july 2009  . Eye surgery    . Esophagogastroduodenoscopy N/A 06/27/2014    Procedure: ESOPHAGOGASTRODUODENOSCOPY (EGD);  Surgeon: Lafayette Dragon, MD;  Location: [facility];  Service: Endoscopy;  Laterality: N/A;   Family History  Problem Relation Age of Onset  . Diabetes Mother   . Heart disease Mother   . Hyperlipidemia Mother   . Depression Mother   . Diabetes Brother   . Colon cancer Neg Hx    History  Substance Use Topics  . Smoking status: Never Smoker   . Smokeless tobacco: Never Used  . Alcohol Use: Yes     Comment: occasional    Review of Systems  Constitutional: Negative for fever.  Respiratory: Positive for shortness of breath.   Cardiovascular: Negative for leg swelling.  Gastrointestinal: Positive for abdominal pain. Negative for bowel incontinence.  Genitourinary: Negative for bladder incontinence.  Musculoskeletal: Positive for back pain.  Neurological: Positive for weakness and numbness.      Allergies  Codeine and Metformin and related  Home Medications   Prior to Admission medications   Medication Sig Start Date End Date Taking? Authorizing Provider  allopurinol (ZYLOPRIM) 100 MG tablet Take 2 tablets (200 mg total) by mouth daily. 03/27/14  Yes Lyndal Pulley, DO  amLODipine (  NORVASC) 10 MG tablet Take 1 tablet (10 mg total) by mouth daily. 03/27/14  Yes Lyndal Pulley, DO  atorvastatin (LIPITOR) 10 MG tablet Take 10 mg by mouth daily.   Yes Historical Provider, MD  clonazePAM (KLONOPIN) 0.5 MG tablet Take 1 tablet (0.5 mg total) by mouth 2 (two) times daily as needed for anxiety. 06/28/14  Yes Theodis Blaze, MD  colchicine 0.6 MG tablet Take 1 tablet (0.6 mg  total) by mouth 2 (two) times daily. 03/27/14  Yes Lyndal Pulley, DO  DULoxetine (CYMBALTA) 30 MG capsule Take 30 mg by mouth daily.    Yes Historical Provider, MD  glipiZIDE (GLUCOTROL XL) 2.5 MG 24 hr tablet Take 1 tablet (2.5 mg total) by mouth daily with breakfast. 05/17/14  Yes Biagio Borg, MD  hydrALAZINE (APRESOLINE) 10 MG tablet Take 1 tablet (10 mg total) by mouth 3 (three) times daily. 06/28/14  Yes Theodis Blaze, MD  levofloxacin (LEVAQUIN) 500 MG tablet Take 1 tablet (500 mg total) by mouth daily. 06/28/14  Yes Theodis Blaze, MD  meloxicam (MOBIC) 15 MG tablet Take 1 tablet (15 mg total) by mouth daily. 03/06/14  Yes Zenaida Niece, PA-C  methylphenidate 54 MG PO CR tablet Take 54 mg by mouth 2 (two) times daily.   Yes Historical Provider, MD  metoCLOPramide (REGLAN) 5 MG tablet Take 1 tablet (5 mg total) by mouth 4 (four) times daily -  before meals and at bedtime. 06/28/14  Yes Theodis Blaze, MD  NUCYNTA 100 MG TABS Take 1 tablet by mouth 3 (three) times daily.  03/24/14  Yes Historical Provider, MD  oxyCODONE-acetaminophen (PERCOCET) 7.5-325 MG per tablet Take 1 tablet by mouth every 6 (six) hours as needed for pain. 06/28/14  Yes Theodis Blaze, MD  pantoprazole (PROTONIX) 40 MG tablet Take 1 tablet (40 mg total) by mouth daily. 06/28/14  Yes Theodis Blaze, MD  pregabalin (LYRICA) 200 MG capsule Take 200 mg by mouth 2 (two) times daily.   Yes Historical Provider, MD  glucose blood (FREESTYLE TEST STRIPS) test strip Use as instructed 06/04/12   Biagio Borg, MD  ondansetron (ZOFRAN) 4 MG tablet Take 1 tablet (4 mg total) by mouth 2 (two) times daily. Patient not taking: Reported on 06/30/2014 06/28/14   Theodis Blaze, MD  ondansetron (ZOFRAN) 4 MG tablet Take 1 tablet (4 mg total) by mouth every 8 (eight) hours as needed for nausea or vomiting. Patient not taking: Reported on 06/30/2014 06/28/14   Theodis Blaze, MD   BP 159/103 mmHg  Pulse 87  Temp(Src) 97.6 F (36.4 C) (Oral)   Resp 20  SpO2 91% Physical Exam  Constitutional: He is oriented to person, place, and time. He appears well-developed and well-nourished.  HENT:  Head: Normocephalic.  Eyes: Pupils are equal, round, and reactive to light.  Neck: Normal range of motion.  Cardiovascular: Normal rate.   Pulmonary/Chest: Accessory muscle usage present. No respiratory distress. He has no rales. He exhibits no tenderness.  Abdominal: Soft. He exhibits no distension. There is no tenderness.  Musculoskeletal: He exhibits no edema or tenderness.  Neurological: He is alert and oriented to person, place, and time. He displays abnormal reflex. A sensory deficit is present. He exhibits abnormal muscle tone.  Reflex Scores:      Patellar reflexes are 1+ on the right side and 1+ on the left side.      Achilles reflexes are 0 on the right side  and 0 on the left side. Skin: Skin is warm. No rash noted. No erythema.  Nursing note and vitals reviewed.   ED Course  CRITICAL CARE Performed by: Garald Balding Authorized by: Garald Balding Total critical care time: 90 minutes Critical care was necessary to treat or prevent imminent or life-threatening deterioration of the following conditions: CNS failure or compromise. Critical care was time spent personally by me on the following activities: development of treatment plan with patient or surrogate, discussions with consultants, evaluation of patient's response to treatment, examination of patient, obtaining history from patient or surrogate, ordering and performing treatments and interventions, ordering and review of laboratory studies, ordering and review of radiographic studies, pulse oximetry, re-evaluation of patient's condition and review of old charts. Comments: discussion with Dr. Janann Colonel  Neuro hospitalist patient will be admitted neuro ICU   (including critical care time) Labs Review Labs Reviewed  EPSTEIN BARR VRS(EBV DNA BY PCR)    Imaging Review Ct Head Wo  Contrast  06/30/2014   CLINICAL DATA:  Weakness. Unable to ambulate for 3 days. No injury.  EXAM: CT HEAD WITHOUT CONTRAST  CT CERVICAL SPINE WITHOUT CONTRAST  TECHNIQUE: Multidetector CT imaging of the head and cervical spine was performed following the standard protocol without intravenous contrast. Multiplanar CT image reconstructions of the cervical spine were also generated.  COMPARISON:  CT head 06/17/2014  FINDINGS: CT HEAD FINDINGS  Ventricles and sulci appear symmetrical. No mass effect or midline shift. No abnormal extra-axial fluid collections. Gray-white matter junctions are distinct. Basal cisterns are not effaced. No evidence of acute intracranial hemorrhage. No depressed skull fractures. Visualized paranasal sinuses and mastoid air cells are not opacified.  CT CERVICAL SPINE FINDINGS  Reversal of the usual cervical lordosis which may be due to patient positioning but ligamentous injury or muscle spasm could also have this appearance and are not excluded. Diffuse degenerative change throughout the cervical spine with narrowed cervical interspaces and associated endplate hypertrophic changes. Slight anterior subluxation at C7-T1 which is probably degenerative. Degenerative changes throughout the facet joints. Degenerative changes at C1 to. C1-2 articulation appears intact. No focal bone lesion or bone destruction. Bone cortex and trabecular architecture appear intact. Soft tissues are unremarkable.  IMPRESSION: No acute intracranial abnormalities.  Degenerative changes throughout the cervical spine. Nonspecific reversal of the usual cervical lordosis. No displaced fractures are appreciated.   Electronically Signed   By: Lucienne Capers M.D.   On: 06/30/2014 01:44   Ct Cervical Spine Wo Contrast  06/30/2014   CLINICAL DATA:  Weakness. Unable to ambulate for 3 days. No injury.  EXAM: CT HEAD WITHOUT CONTRAST  CT CERVICAL SPINE WITHOUT CONTRAST  TECHNIQUE: Multidetector CT imaging of the head and  cervical spine was performed following the standard protocol without intravenous contrast. Multiplanar CT image reconstructions of the cervical spine were also generated.  COMPARISON:  CT head 06/17/2014  FINDINGS: CT HEAD FINDINGS  Ventricles and sulci appear symmetrical. No mass effect or midline shift. No abnormal extra-axial fluid collections. Gray-white matter junctions are distinct. Basal cisterns are not effaced. No evidence of acute intracranial hemorrhage. No depressed skull fractures. Visualized paranasal sinuses and mastoid air cells are not opacified.  CT CERVICAL SPINE FINDINGS  Reversal of the usual cervical lordosis which may be due to patient positioning but ligamentous injury or muscle spasm could also have this appearance and are not excluded. Diffuse degenerative change throughout the cervical spine with narrowed cervical interspaces and associated endplate hypertrophic changes. Slight anterior subluxation  at C7-T1 which is probably degenerative. Degenerative changes throughout the facet joints. Degenerative changes at C1 to. C1-2 articulation appears intact. No focal bone lesion or bone destruction. Bone cortex and trabecular architecture appear intact. Soft tissues are unremarkable.  IMPRESSION: No acute intracranial abnormalities.  Degenerative changes throughout the cervical spine. Nonspecific reversal of the usual cervical lordosis. No displaced fractures are appreciated.   Electronically Signed   By: Lucienne Capers M.D.   On: 06/30/2014 01:44     EKG Interpretation None    patient remains weak, having some respiratory distress able to speak in full sentences but has to gasp to complete a paragraph, having trouble sitting up on his own.  I spoke with hospital neurology.  He will come evaluate is not requesting any special tests on the cerebrospinal fluid Dr. Janann Colonel at bedside will admit neuro ICU, no need for intubation at this time MDM   Final diagnoses:  Pain          Garald Balding, NP 06/30/14 [place], MD 06/30/14 [phone]

## 2014-06-30 NOTE — Progress Notes (Signed)
Pt. Performed 1.3L on the vital capacity and -20 on the NIF.

## 2014-06-30 NOTE — Progress Notes (Signed)
Subjective: Patient becomes out of breath while giving me history  Exam: Filed Vitals:   06/30/14 0700  BP: 154/128  Pulse: 82  Temp: 98 F (36.7 C)  Resp: 19   Gen: In bed, NAD MS: Awake, alert, interactive and appropriate AO:ZHYQM, EOMI  Motor: 2 -3 /5 dorsi/plantarflexion bilaterally. He has 4/5 weakness of upper extremities  Sensory: absent sensation in the feet.  VHQ:IONGEX throughout.    Impression: 58 yo M with ascending numbness/weakness and pain. 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 Though transverse myelitis could be considered, either way I would favor plasma exchange as a modality of treatment given the severity of symptoms.   Recommendations: 1) PLEX today and tomorrow, then qod for total 5 treatments.  2) Frequent NIF and VC, I suspect he will need to be intubated.   Roland Rack, MD [facility] [phone]  If 7pm- 7am, please page neurology on call as listed in [place].

## 2014-06-30 NOTE — ED Notes (Signed)
[facility] called for transport. 

## 2014-06-30 NOTE — ED Notes (Signed)
Report given to Justin with [facility] 

## 2014-06-30 NOTE — Clinical Social Work Note (Signed)
Clinical Social Worker received notification from [facility] that patient is a current resident.  Facility states that patient admitted to them on 11/18 from [facility] and was back to [facility] for admission on 11/20.  Patient is now on the ventilator in the intensive care unit.  CSW to follow up with patient and family for support and to assist with discharge planning needs once medically appropriate.  Full assessment to follow.  Frank [name], Frank [name]

## 2014-06-30 NOTE — Consult Note (Signed)
Consult Reason for Consult: ascending paralysis Referring Physician: Dr Daphene Jaeger ED  CC: ascending paralysis  HPI: Frank [name] is an 58 y.o. male presenting for evaluation of ascending paralysis. History mainly obtained via patients partner. He notes that patient developed back and shoulder pain and mild LE weakness on October 1st. At that time he also developed nausea and vomiting. These symptoms all got progressively worse. Was evaluated in ED multiple times and admitted to [facility] from 11/11 to 11/18 with unclear etiology. Hospital course complicated by PNA and urinary retention. Symptoms did not resolve but he was sent to rehab. While in rehab his symptoms continued to progress. Went to [facility] ED the morning of 11/19 with weakness and severe pain. Sent back to rehab center, but continued to decline so brought to [facility].   Patient unable to provide history due to limited ability to speak.   He had MRI of T and L spine completed on 06/22/2014. Imaging reviewed. Pertinent finding is a left paracentral disc extrusion at T7-8 with mild cord flattening but no abnormal cord signal or compression deformity. CT head and C spine from 11/20 reviewed and pertinent for diffuse degenerative changes of C spine.   Had negative HIV antibody on 05/31/2014  Past Medical History  Diagnosis Date  . COLONIC POLYPS 02/01/2008  . DIABETES MELLITUS, TYPE II 05/20/2010  . HYPERLIPIDEMIA 10/09/2008  . ANXIETY DEPRESSION 02/01/2008  . ERECTILE DYSFUNCTION 10/09/2008  . ADD 10/09/2008  . SLEEP APNEA, OBSTRUCTIVE 02/01/2008  . MORTON'S NEUROMA 05/20/2010  . PERIPHERAL NEUROPATHY 05/20/2010  . Other specified forms of hearing loss 06/27/2009  . HYPERTENSION 10/09/2008  . HEMORRHOIDS 02/01/2008  . ALLERGIC RHINITIS 10/09/2008  . Stricture and stenosis of esophagus 02/02/2008  . GERD 02/01/2008  . HIATAL HERNIA 02/01/2008  . ERECTILE DYSFUNCTION, ORGANIC 05/20/2010  . WRIST PAIN, LEFT 12/05/2009  . FOOT PAIN, LEFT 05/20/2010  .  PERIPHERAL EDEMA 05/20/2010  . DYSPNEA 03/12/2010  . Abdominal pain, unspecified site 01/19/2009  . Abdominal pain, left lower quadrant 06/06/2010  . Type II or unspecified type diabetes mellitus without mention of complication, uncontrolled 11/14/2010    Past Surgical History  Procedure Laterality Date  . Carpal tunnel release    . Rotator cuff repair    . Esophageal dilation  july 2009  . Eye surgery    . Esophagogastroduodenoscopy N/A 06/27/2014    Procedure: ESOPHAGOGASTRODUODENOSCOPY (EGD);  Surgeon: Lafayette Dragon, MD;  Location: [facility];  Service: Endoscopy;  Laterality: N/A;    Family History  Problem Relation Age of Onset  . Diabetes Mother   . Heart disease Mother   . Hyperlipidemia Mother   . Depression Mother   . Diabetes Brother   . Colon cancer Neg Hx     Social History:  reports that he has never smoked. He has never used smokeless tobacco. He reports that he drinks alcohol. He reports that he uses illicit drugs (Methaqualone).  Allergies  Allergen Reactions  . Codeine Itching    REACTION: Itching  . Metformin And Related Other (See Comments)    GI upset, diarrhea    Medications: I have reviewed the patient's current medications.   ROS: Out of a complete 14 system review, the patient complains of only the following symptoms, and all other reviewed systems are negative. +weakness, fatigue, shortness of breath, pain Physical Examination: Blood pressure 159/103, pulse 87, temperature 97.6 F (36.4 C), temperature source Oral, resp. rate 20, SpO2 91 %.  Neurologic Examination Mental Status: Alert, in  mild distress. Quiet speech, able to only get out 1 to 2 word answers.  Cranial Nerves: II: unable to visualize fundi due to pupil size, visual fields grossly normal, pupils equal, round, reactive to light and accommodation III,IV, VI: ptosis not present, extra-ocular motions intact bilaterally with minimally impaired vertical gaze bilaterally V,VII:  difficulty smiling due to weakness, appears symmetric VIII: hearing normal bilaterally IX,X: gag reflex present XI: trapezius strength/neck flexion strength normal bilaterally XII: tongue strength normal  Motor: Slumped over in bed, requires assistance to straighten self up.  Proximal bilateral UE 3/5, distally 3/5 Proximal bilateral LE 2+/5, distally 3/5 Sensory: Pinprick and light diminished bilateral LE Deep Tendon Reflexes: absent reflexes throughout Plantars: Right: downgoing   Left: downgoing Cerebellar: Unable to assess due to weakness Gait: unable to assess due to weakness  Laboratory Studies:   Basic Metabolic Panel:  Recent Labs Lab 06/25/14 0504 06/26/14 0417 06/27/14 0540 06/28/14 0433 06/29/14 0602  NA 143 144 143 142 141  K 3.6* 4.0 3.3* 3.5* 3.9  CL 104 106 100 101 100  CO2 25 23 29 30 27   GLUCOSE 156* 145* 145* 133* 142*  BUN 33* 33* 28* 31* 25*  CREATININE 0.83 0.70 0.72 0.92 0.82  CALCIUM 10.0 9.7 9.5 9.6 9.5    Liver Function Tests: No results for input(s): AST, ALT, ALKPHOS, BILITOT, PROT, ALBUMIN in the last 168 hours. No results for input(s): LIPASE, AMYLASE in the last 168 hours. No results for input(s): AMMONIA in the last 168 hours.  CBC:  Recent Labs Lab 06/25/14 0504 06/26/14 0417 06/27/14 0540 06/28/14 0433 06/29/14 0602  WBC 24.2* 15.0* 11.6* 8.7 7.7  NEUTROABS  --   --   --   --  5.2  HGB 15.2 14.6 14.3 13.8 14.4  HCT 44.0 42.3 40.2 41.1 41.3  MCV 89.1 89.8 89.7 91.1 87.3  PLT 264 244 246 217 239    Cardiac Enzymes: No results for input(s): CKTOTAL, CKMB, CKMBINDEX, TROPONINI in the last 168 hours.  BNP: Invalid input(s): POCBNP  CBG:  Recent Labs Lab 06/28/14 0004 06/28/14 0408 06/28/14 0735 06/28/14 1124 06/28/14 1640  GLUCAP 156* 114* 134* 146* 146*    Microbiology: Results for orders placed or performed during the hospital encounter of 06/21/14  Culture, Urine     Status: None   Collection Time: 06/24/14  12:44 PM  Result Value Ref Range Status   Specimen Description URINE, CATHETERIZED  Final   Special Requests NONE  Final   Culture  Setup Time   Final    06/24/2014 20:47 Performed at [place] Performed at [facility]   Final   Culture NO GROWTH Performed at [facility]   Final   Report Status 06/25/2014 FINAL  Final    Coagulation Studies: No results for input(s): LABPROT, INR in the last 72 hours.  Urinalysis:  Recent Labs Lab 06/24/14 1244 06/29/14 0705  COLORURINE YELLOW YELLOW  LABSPEC 1.024 1.020  PHURINE 5.5 8.0  GLUCOSEU 250* NEGATIVE  HGBUR NEGATIVE TRACE*  BILIRUBINUR MODERATE* NEGATIVE  KETONESUR >80* 15*  PROTEINUR 30* NEGATIVE  UROBILINOGEN 0.2 0.2  NITRITE NEGATIVE NEGATIVE  LEUKOCYTESUR NEGATIVE NEGATIVE    Lipid Panel:     Component Value Date/Time   CHOL 209* 05/04/2014 0900   TRIG 151.0* 05/04/2014 0900   HDL 48.60 05/04/2014 0900   CHOLHDL 4 05/04/2014 0900   VLDL 30.2 05/04/2014 0900   LDLCALC 130* 05/04/2014 0900  HgbA1C:  Lab Results  Component Value Date   HGBA1C 5.9* 06/21/2014    Urine Drug Screen:  No results found for: LABOPIA, COCAINSCRNUR, LABBENZ, AMPHETMU, THCU, LABBARB  Alcohol Level: No results for input(s): ETH in the last 168 hours.  Other results:  Imaging: Ct Head Wo Contrast  06/30/2014   CLINICAL DATA:  Weakness. Unable to ambulate for 3 days. No injury.  EXAM: CT HEAD WITHOUT CONTRAST  CT CERVICAL SPINE WITHOUT CONTRAST  TECHNIQUE: Multidetector CT imaging of the head and cervical spine was performed following the standard protocol without intravenous contrast. Multiplanar CT image reconstructions of the cervical spine were also generated.  COMPARISON:  CT head 06/17/2014  FINDINGS: CT HEAD FINDINGS  Ventricles and sulci appear symmetrical. No mass effect or midline shift. No abnormal extra-axial fluid collections. Gray-white matter junctions are distinct.  Basal cisterns are not effaced. No evidence of acute intracranial hemorrhage. No depressed skull fractures. Visualized paranasal sinuses and mastoid air cells are not opacified.  CT CERVICAL SPINE FINDINGS  Reversal of the usual cervical lordosis which may be due to patient positioning but ligamentous injury or muscle spasm could also have this appearance and are not excluded. Diffuse degenerative change throughout the cervical spine with narrowed cervical interspaces and associated endplate hypertrophic changes. Slight anterior subluxation at C7-T1 which is probably degenerative. Degenerative changes throughout the facet joints. Degenerative changes at C1 to. C1-2 articulation appears intact. No focal bone lesion or bone destruction. Bone cortex and trabecular architecture appear intact. Soft tissues are unremarkable.  IMPRESSION: No acute intracranial abnormalities.  Degenerative changes throughout the cervical spine. Nonspecific reversal of the usual cervical lordosis. No displaced fractures are appreciated.   Electronically Signed   By: Lucienne Capers M.D.   On: 06/30/2014 01:44   Ct Cervical Spine Wo Contrast  06/30/2014   CLINICAL DATA:  Weakness. Unable to ambulate for 3 days. No injury.  EXAM: CT HEAD WITHOUT CONTRAST  CT CERVICAL SPINE WITHOUT CONTRAST  TECHNIQUE: Multidetector CT imaging of the head and cervical spine was performed following the standard protocol without intravenous contrast. Multiplanar CT image reconstructions of the cervical spine were also generated.  COMPARISON:  CT head 06/17/2014  FINDINGS: CT HEAD FINDINGS  Ventricles and sulci appear symmetrical. No mass effect or midline shift. No abnormal extra-axial fluid collections. Gray-white matter junctions are distinct. Basal cisterns are not effaced. No evidence of acute intracranial hemorrhage. No depressed skull fractures. Visualized paranasal sinuses and mastoid air cells are not opacified.  CT CERVICAL SPINE FINDINGS  Reversal  of the usual cervical lordosis which may be due to patient positioning but ligamentous injury or muscle spasm could also have this appearance and are not excluded. Diffuse degenerative change throughout the cervical spine with narrowed cervical interspaces and associated endplate hypertrophic changes. Slight anterior subluxation at C7-T1 which is probably degenerative. Degenerative changes throughout the facet joints. Degenerative changes at C1 to. C1-2 articulation appears intact. No focal bone lesion or bone destruction. Bone cortex and trabecular architecture appear intact. Soft tissues are unremarkable.  IMPRESSION: No acute intracranial abnormalities.  Degenerative changes throughout the cervical spine. Nonspecific reversal of the usual cervical lordosis. No displaced fractures are appreciated.   Electronically Signed   By: Lucienne Capers M.D.   On: 06/30/2014 01:44     Assessment/Plan:  58y/o gentleman presenting with rapidly progressive ascending paralysis. 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 Differential also includes cervical cord pathology such as transverse myelitis, acute porphyria and  MG.   -lumbar puncture for cell count, Gram stain, protein, HSV, WNV -check urine porphinobilonogen, Ach-receptor antibody -will transfer to [facility] and admit to neuro-ICU, CCM has been notified -will check vital capacity and NIF q2 hours initially. Will need to monitor closely for possible intubation -pending LP results will consider starting IVIG or Plasma exchange -will consider MRI C spine with and without  This patient is critically ill and at significant risk of neurological worsening, death and care requires constant monitoring of vital signs, hemodynamics,respiratory and cardiac monitoring,review of multiple databases, neurological assessment, discussion with family, other specialists and medical decision making of high complexity. I spent 65 inutes of  neurocritical care time in the care of this patient.   Jim Like, DO [facility] [phone]  If 7pm- 7am, please page neurology on call as listed in [place]. 06/30/2014, 3:00 AM

## 2014-06-30 NOTE — Consult Note (Signed)
PULMONARY / CRITICAL CARE MEDICINE   Name: Frank [name] MRN: [id number] DOB: 06-18-1956    ADMISSION DATE:  06/30/2014 CONSULTATION DATE:  11/20  REFERRING MD :  Neuro  CHIEF COMPLAINT:  Can't walk  INITIAL PRESENTATION:  Progressive weakness  STUDIES:    SIGNIFICANT EVENTS:    HISTORY OF PRESENT ILLNESS:   58 yo WM with progressive ascending neuromuscular weakness over several months who now presents with inability to walk, dysarthria, profound weakness and impending respiratory failure. Neurology has requested PCCM to placed HD catheter for plasma exchange and he needs intubation for airway protection.    PAST MEDICAL HISTORY :   has a past medical history of COLONIC POLYPS (02/01/2008); DIABETES MELLITUS, TYPE II (05/20/2010); HYPERLIPIDEMIA (10/09/2008); ANXIETY DEPRESSION (02/01/2008); ERECTILE DYSFUNCTION (10/09/2008); ADD (10/09/2008); SLEEP APNEA, OBSTRUCTIVE (02/01/2008); MORTON'S NEUROMA (05/20/2010); PERIPHERAL NEUROPATHY (05/20/2010); Other specified forms of hearing loss (06/27/2009); HYPERTENSION (10/09/2008); HEMORRHOIDS (02/01/2008); ALLERGIC RHINITIS (10/09/2008); Stricture and stenosis of esophagus (02/02/2008); GERD (02/01/2008); HIATAL HERNIA (02/01/2008); ERECTILE DYSFUNCTION, ORGANIC (05/20/2010); WRIST PAIN, LEFT (12/05/2009); FOOT PAIN, LEFT (05/20/2010); PERIPHERAL EDEMA (05/20/2010); DYSPNEA (03/12/2010); Abdominal pain, unspecified site (01/19/2009); Abdominal pain, left lower quadrant (06/06/2010); and Type II or unspecified type diabetes mellitus without mention of complication, uncontrolled (11/14/2010).  has past surgical history that includes Carpal tunnel release; Rotator cuff repair; Esophageal dilation (july 2009); Eye surgery; and Esophagogastroduodenoscopy (N/A, 06/27/2014). Prior to Admission medications   Medication Sig Start Date End Date Taking? Authorizing Provider  allopurinol (ZYLOPRIM) 100 MG tablet Take 2 tablets (200 mg total) by mouth daily. 03/27/14  Yes  Lyndal Pulley, DO  amLODipine (NORVASC) 10 MG tablet Take 1 tablet (10 mg total) by mouth daily. 03/27/14  Yes Lyndal Pulley, DO  atorvastatin (LIPITOR) 10 MG tablet Take 10 mg by mouth daily.   Yes Historical Provider, MD  clonazePAM (KLONOPIN) 0.5 MG tablet Take 1 tablet (0.5 mg total) by mouth 2 (two) times daily as needed for anxiety. 06/28/14  Yes Theodis Blaze, MD  colchicine 0.6 MG tablet Take 1 tablet (0.6 mg total) by mouth 2 (two) times daily. 03/27/14  Yes Lyndal Pulley, DO  DULoxetine (CYMBALTA) 30 MG capsule Take 30 mg by mouth daily.    Yes Historical Provider, MD  glipiZIDE (GLUCOTROL XL) 2.5 MG 24 hr tablet Take 1 tablet (2.5 mg total) by mouth daily with breakfast. 05/17/14  Yes Biagio Borg, MD  hydrALAZINE (APRESOLINE) 10 MG tablet Take 1 tablet (10 mg total) by mouth 3 (three) times daily. 06/28/14  Yes Theodis Blaze, MD  levofloxacin (LEVAQUIN) 500 MG tablet Take 1 tablet (500 mg total) by mouth daily. 06/28/14  Yes Theodis Blaze, MD  meloxicam (MOBIC) 15 MG tablet Take 1 tablet (15 mg total) by mouth daily. 03/06/14  Yes Zenaida Niece, PA-C  methylphenidate 54 MG PO CR tablet Take 54 mg by mouth 2 (two) times daily.   Yes Historical Provider, MD  metoCLOPramide (REGLAN) 5 MG tablet Take 1 tablet (5 mg total) by mouth 4 (four) times daily -  before meals and at bedtime. 06/28/14  Yes Theodis Blaze, MD  NUCYNTA 100 MG TABS Take 1 tablet by mouth 3 (three) times daily.  03/24/14  Yes Historical Provider, MD  oxyCODONE-acetaminophen (PERCOCET) 7.5-325 MG per tablet Take 1 tablet by mouth every 6 (six) hours as needed for pain. 06/28/14  Yes Theodis Blaze, MD  pantoprazole (PROTONIX) 40 MG tablet Take 1 tablet (40 mg total) by mouth daily. 06/28/14  Yes Theodis Blaze, MD  pregabalin (LYRICA) 200 MG capsule Take 200 mg by mouth 2 (two) times daily.   Yes Historical Provider, MD  glucose blood (FREESTYLE TEST STRIPS) test strip Use as instructed 06/04/12   Biagio Borg, MD   ondansetron (ZOFRAN) 4 MG tablet Take 1 tablet (4 mg total) by mouth 2 (two) times daily. Patient not taking: Reported on 06/30/2014 06/28/14   Theodis Blaze, MD  ondansetron (ZOFRAN) 4 MG tablet Take 1 tablet (4 mg total) by mouth every 8 (eight) hours as needed for nausea or vomiting. Patient not taking: Reported on 06/30/2014 06/28/14   Theodis Blaze, MD   Allergies  Allergen Reactions  . Codeine Itching    REACTION: Itching  . Metformin And Related Other (See Comments)    GI upset, diarrhea    FAMILY HISTORY:  has no family status information on file.  SOCIAL HISTORY:  reports that he has never smoked. He has never used smokeless tobacco. He reports that he drinks alcohol. He reports that he uses illicit drugs (Methaqualone).  REVIEW OF SYSTEMS:  NA  SUBJECTIVE:   VITAL SIGNS: Temp:  [97.6 F (36.4 C)-98 F (36.7 C)] 98 F (36.7 C) (11/20 0700) Pulse Rate:  [72-99] 82 (11/20 0700) Resp:  [14-26] 19 (11/20 0700) BP: (133-204)/(84-139) 154/128 mmHg (11/20 0700) SpO2:  [91 %-98 %] 93 % (11/20 0700) Weight:  [185 lb 3 oz (84 kg)] 185 lb 3 oz (84 kg) (11/20 0500) HEMODYNAMICS:   VENTILATOR SETTINGS:   INTAKE / OUTPUT: No intake or output data in the 24 hours ending 06/30/14 0829  PHYSICAL EXAMINATION: General: WNWDWF Neuro:  Ascending paralysis, dysarthria, extremity weakness and spacticity   HEENT:  No JVD /LAN Cardiovascular:  HSR RRR Lungs: CTA Abdomen:  +bs Musculoskeletal:  General weakness  Skin:  warm  LABS:  CBC  Recent Labs Lab 06/28/14 0433 06/29/14 0602 06/30/14 0422  WBC 8.7 7.7 7.4  HGB 13.8 14.4 13.9  HCT 41.1 41.3 38.9*  PLT 217 239 245   Coag's No results for input(s): APTT, INR in the last 168 hours. BMET  Recent Labs Lab 06/27/14 0540 06/28/14 0433 06/29/14 0602 06/30/14 0422  NA 143 142 141  --   K 3.3* 3.5* 3.9  --   CL 100 101 100  --   CO2 29 30 27   --   BUN 28* 31* 25*  --   CREATININE 0.72 0.92 0.82 0.86  GLUCOSE  145* 133* 142*  --    Electrolytes  Recent Labs Lab 06/27/14 0540 06/28/14 0433 06/29/14 0602  CALCIUM 9.5 9.6 9.5   Sepsis Markers No results for input(s): LATICACIDVEN, PROCALCITON, O2SATVEN in the last 168 hours. ABG No results for input(s): PHART, PCO2ART, PO2ART in the last 168 hours. Liver Enzymes No results for input(s): AST, ALT, ALKPHOS, BILITOT, ALBUMIN in the last 168 hours. Cardiac Enzymes No results for input(s): TROPONINI, PROBNP in the last 168 hours. Glucose  Recent Labs Lab 06/27/14 2015 06/28/14 0004 06/28/14 0408 06/28/14 0735 06/28/14 1124 06/28/14 1640  GLUCAP 134* 156* 114* 134* 146* 146*    Imaging No results found.   ASSESSMENT / PLAN:  NEUROLOGIC A:   Progressive paralysis consistent with GBS  P:   RASS goal: 1 Will need plasma exhange per neuro input. Other recommendations per neuro.  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  CARDIOVASCULAR CVL  1/20 rt i j hd cath>> A:  Hx of htn P:  Hold antihypertensives for now. Monitor BP  RENAL A:   No acute issue P:   KVO IVF TF BMET in AM  Replace electrolytes as indicated.  GASTROINTESTINAL A:   He will need enteral feeds while intubated and until he can protect his airway. P:   Place Panda post intubation TF per nutrition  HEMATOLOGIC A:   No acute issues P:  CBC daily Transfuse per ICU protocol  INFECTIOUS A:   No indicators for infectious process P:   Monitor Abx and fever curve.  ENDOCRINE A:   Hyperglycemia  P:   SSI  FAMILY  - Updates: Patient and partner updated bedside.  - Inter-disciplinary family meet or Palliative Care meeting due by:  day 7  TODAY'S SUMMARY:  58 yo with  progressive ascending neurological paralysis and will need plasma exchange and intubation.  [name] Minor ACNP [facility] PCCM Pager [phone] till 3 pm If no answer page  [phone] 06/30/2014, 8:31 AM  Above note edited in full, will intubate, mechanically ventilate, place HD catheter then proceed with plasmapheresis.  Patient has been very resistant and uncooperative with decision making regarding code status, waited for partner to arrive then finally decision made to remain full code.  CC time 90 min.  Patient seen and examined, agree with above note.  I dictated the care and orders written for this patient under my direction.  Rush Farmer, MD [phone]

## 2014-06-30 NOTE — Procedures (Signed)
Intubation Procedure Note Frank [name] [id number] 1955/10/20  Procedure: Intubation Indications: Respiratory insufficiency  Procedure Details Consent: Risks of procedure as well as the alternatives and risks of each were explained to the (patient/caregiver).  Consent for procedure obtained. Time Out: Verified patient identification, verified procedure, site/side was marked, verified correct patient position, special equipment/implants available, medications/allergies/relevent history reviewed, required imaging and test results available.  Performed  MAC and 3 Medications:  Fentanyl 100 mcg Etomidate 20 mg Versed 2 mg NMB zemuron 60 mg   Evaluation Hemodynamic Status: BP stable throughout; O2 sats: stable throughout Patient's Current Condition: stable Complications: No apparent complications Patient did tolerate procedure well. Chest X-ray ordered to verify placement.  CXR: pending.   [name] Minor ACNP [facility] PCCM Pager [phone] till 3 pm If no answer page [phone] 06/30/2014, 9:23 AM  I was present and supervised the entire procedure.  Rush Farmer, M.D. [facility] Pulmonary/Critical Care Medicine. Pager: [phone]. After hours pager: [phone].

## 2014-06-30 NOTE — Procedures (Signed)
Hemodialysis Insertion Procedure Note Frank [name] [id number] 05/23/1956  Procedure: Insertion of Hemodialysis Catheter Type: 3 port  Indications: Hemodialysis   Procedure Details Consent: Risks of procedure as well as the alternatives and risks of each were explained to the (patient/caregiver).  Consent for procedure obtained. Time Out: Verified patient identification, verified procedure, site/side was marked, verified correct patient position, special equipment/implants available, medications/allergies/relevent history reviewed, required imaging and test results available.  Performed  Maximum sterile technique was used including antiseptics, cap, gloves, gown, hand hygiene, mask and sheet. Skin prep: Chlorhexidine; local anesthetic administered A antimicrobial bonded/coated triple lumen catheter was placed in the right internal jugular vein using the Seldinger technique. Ultrasound guidance used.Yes.   Catheter placed to 16 cm. Blood aspirated via all 3 ports and then flushed x 3. Line sutured x 2 and dressing applied.  Evaluation Blood flow good Complications: No apparent complications Patient did tolerate procedure well. Chest X-ray ordered to verify placement.  CXR: pending.  [name] Minor ACNP [facility] PCCM Pager [phone] till 3 pm If no answer page [phone] 06/30/2014, 9:25 AM  U/S used in placement.  I was present and supervised the entire procedure.  Rush Farmer, M.D. [facility] Pulmonary/Critical Care Medicine. Pager: [phone]. After hours pager: [phone].

## 2014-06-30 NOTE — ED Notes (Signed)
Bed: OY24 Expected date:  Expected time:  Means of arrival:  Comments: EMS- chronic pain

## 2014-06-30 NOTE — Progress Notes (Signed)
LP results returned show albuminocytologic dissociation (protein 114, 1 WBC). 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 Will start treatment with IVIG. Will do 0.4mg /kg/d x 5 days for a total of 2grams/kg total.

## 2014-06-30 NOTE — ED Notes (Signed)
Patient transported to CT 

## 2014-06-30 NOTE — Care Management Note (Signed)
  Page 1 of 1   06/30/2014     12:09:47 PM CARE MANAGEMENT NOTE 06/30/2014  Patient:  Frank [name], Frank [name]   Account Number:  [id number]  Date Initiated:  06/30/2014  Documentation initiated by:  Magdalen Spatz  Subjective/Objective Assessment:     Action/Plan:   Anticipated DC Date:  07/03/2014   Anticipated DC Plan:  [place]  In-house referral  Clinical Social Worker         Choice offered to / List presented to:             Status of service:   Medicare Important Message given?   (If response is "NO", the following Medicare IM given date fields will be blank) Date Medicare IM given:   Medicare IM given by:   Date Additional Medicare IM given:   Additional Medicare IM given by:    Discharge Disposition:    Per UR Regulation:  Reviewed for med. necessity/level of care/duration of stay  If discussed at [place] of Stay Meetings, dates discussed:    Comments:

## 2014-06-30 NOTE — ED Notes (Signed)
Patient presents from [facility] via EMS for generalized pain. Patient was seen at [facility] x1 week ago and discharged to [facility] for rehab due to weakness, seen at [facility] earlier today for no relief of pain. Patient is non ambulatory x2-3 days. A&Ox4.   VS 150/78BP, 80HR, 16Resp, 92% on RA, 97% 2L.  IV 22 L hand, 13mcg Fentanyl

## 2014-06-30 NOTE — Progress Notes (Signed)
NIF/VC done per MD order.  Pt gave good effort.  Best of three attempts:  NIF:-18 cm VC: 0.8L

## 2014-06-30 NOTE — Progress Notes (Signed)
PT Cancellation Note  Patient Details Name: [name] MRN: [id number] DOB: [birth year]   Cancelled Treatment:    Reason Eval/Treat Not Completed: Patient not medically ready.  Critical Care note indicates plan for Plasma Exchange and most likely intubation.  Will hold PT eval at this time.  Please advise if pt is appropriate for PT and mobility, or if we need to sign off until medically appropriate.  Thanks.     Korban Shearer, Thornton Papas 06/30/2014, 9:15 AM

## 2014-06-30 NOTE — Progress Notes (Signed)
INITIAL NUTRITION ASSESSMENT  Pt meets criteria for SEVERE MALNUTRITION in the context of acute illness as evidenced by 7% weight loss < 1 month and intake of </= 50% of his needs for >/= 5 days.  DOCUMENTATION CODES Per approved criteria  -Severe malnutrition in the context of acute illness or injury   INTERVENTION: Initiate Vital AF 1.2 @ 20 ml/hr via feeding tube and increase by 10 ml every 4 hours to goal rate of 50 ml/hr.   30 ml Prostat TID.    Monitor magnesium, potassium, and phosphorus daily for at least 3 days, MD to replete as needed, as pt is at risk for refeeding syndrome given severe malnutrition.  Tube feeding regimen provides 1740 kcal (97% of needs), 135 grams of protein, and 973 ml of H2O.   NUTRITION DIAGNOSIS: Inadequate oral intake related to inability to eat as evidenced by NPO status  Goal: Pt to meet >/= 90% of their estimated nutrition needs   Monitor:  TF initiation and tolerance, weight trends, labs  Reason for Assessment: Consult received to initiate and manage enteral nutrition support.  58 y.o. male  Admitting Dx: Progressive Weakness  ASSESSMENT: Pt admitted this am with progressive weakness, now unable to walk. 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  Pt intubated this am for airway protection and HD catheter inserted to begin IVIG.  Pt has had very poor intake x 2 weeks as well as nausea and vomiting. Per previous admission documentation pt was declining meals most of the time. Supplements ordered but little consumed.   Patient is currently intubated on ventilator support MV: 10 L/min Temp (24hrs), Avg:97.8 F (36.6 C), Min:97.6 F (36.4 C), Max:98 F (36.7 C)  Propofol: none  Nutrition Focused Physical Exam:  Subcutaneous Fat:  Orbital Region: WDL Upper Arm Region: mild/moderate depletion  Thoracic and Lumbar Region: WDL  Muscle:  Temple Region: WDL Clavicle Bone Region: WDL Clavicle and Acromion Bone Region:  WDL Scapular Bone Region: WDL Dorsal Hand: NA Patellar Region: mild/moderate depletion  Anterior Thigh Region: mild/moderate depletion  Posterior Calf Region: WDL   Edema: NA   Height: Ht Readings from Last 1 Encounters:  06/30/14 5\' 9"  (1.753 m)    Weight: Wt Readings from Last 1 Encounters:  06/30/14 185 lb 3 oz (84 kg)    Ideal Body Weight: 72.7 kg   % Ideal Body Weight: 116%  Wt Readings from Last 10 Encounters:  06/30/14 185 lb 3 oz (84 kg)  06/21/14 195 lb (88.451 kg)  06/20/14 200 lb (90.719 kg)  05/19/14 200 lb (90.719 kg)  05/17/14 200 lb (90.719 kg)  04/18/14 199 lb (90.266 kg)  03/27/14 206 lb (93.441 kg)  03/10/14 200 lb (90.719 kg)  03/06/14 200 lb (90.719 kg)  01/12/14 207 lb 6 oz (94.065 kg)    Usual Body Weight: 200 lb   % Usual Body Weight: 93%  BMI:  Body mass index is 27.33 kg/(m^2).  Estimated Nutritional Needs: Kcal: 0623 Protein: >/= 126 grams Fluid: > 1.8 L/day  Skin: WDL  Diet Order: Diet NPO time specified  EDUCATION NEEDS: -No education needs identified at this time   Intake/Output Summary (Last 24 hours) at 06/30/14 0934 Last data filed at 06/30/14 0900  Gross per 24 hour  Intake    100 ml  Output    300 ml  Net   -200 ml    Last BM: PTA   Labs:   Recent Labs Lab 06/27/14 0540 06/28/14 0433 06/29/14 0602  06/30/14 0422  NA 143 142 141  --   K 3.3* 3.5* 3.9  --   CL 100 101 100  --   CO2 29 30 27   --   BUN 28* 31* 25*  --   CREATININE 0.72 0.92 0.82 0.86  CALCIUM 9.5 9.6 9.5  --   GLUCOSE 145* 133* 142*  --     CBG (last 3)   Recent Labs  06/28/14 1124 06/28/14 1640 06/30/14 0738  GLUCAP 146* 146* 124*    Scheduled Meds: . enoxaparin (LOVENOX) injection  40 mg Subcutaneous Q24H  . famotidine (PEPCID) IV  20 mg Intravenous Q24H  . fentaNYL      . midazolam        Continuous Infusions: . fentaNYL infusion INTRAVENOUS    . midazolam (VERSED) infusion      Past Medical History  Diagnosis  Date  . COLONIC POLYPS 02/01/2008  . DIABETES MELLITUS, TYPE II 05/20/2010  . HYPERLIPIDEMIA 10/09/2008  . ANXIETY DEPRESSION 02/01/2008  . ERECTILE DYSFUNCTION 10/09/2008  . ADD 10/09/2008  . SLEEP APNEA, OBSTRUCTIVE 02/01/2008  . MORTON'S NEUROMA 05/20/2010  . PERIPHERAL NEUROPATHY 05/20/2010  . Other specified forms of hearing loss 06/27/2009  . HYPERTENSION 10/09/2008  . HEMORRHOIDS 02/01/2008  . ALLERGIC RHINITIS 10/09/2008  . Stricture and stenosis of esophagus 02/02/2008  . GERD 02/01/2008  . HIATAL HERNIA 02/01/2008  . ERECTILE DYSFUNCTION, ORGANIC 05/20/2010  . WRIST PAIN, LEFT 12/05/2009  . FOOT PAIN, LEFT 05/20/2010  . PERIPHERAL EDEMA 05/20/2010  . DYSPNEA 03/12/2010  . Abdominal pain, unspecified site 01/19/2009  . Abdominal pain, left lower quadrant 06/06/2010  . Type II or unspecified type diabetes mellitus without mention of complication, uncontrolled 11/14/2010    Past Surgical History  Procedure Laterality Date  . Carpal tunnel release    . Rotator cuff repair    . Esophageal dilation  july 2009  . Eye surgery    . Esophagogastroduodenoscopy N/A 06/27/2014    Procedure: ESOPHAGOGASTRODUODENOSCOPY (EGD);  Surgeon: Lafayette Dragon, MD;  Location: [facility];  Service: Endoscopy;  Laterality: N/A;    Maylon Peppers RD, [place], [place] Pager [phone] After Hours Pager

## 2014-06-30 NOTE — ED Provider Notes (Signed)
Medical screening examination/treatment/procedure(s) were conducted as a shared visit with non-physician practitioner(s) and myself.  I personally evaluated the patient during the encounter.   EKG Interpretation None     Pt seen with NP.  Over the last month has had increased weakness, difficulties urination (retention), n/v.  Admitted, now in skilled nursing, unable to walk, had been ambulatory at beginning of month.  Concern for Guillain Barre.  LP completed.  Neuro has seen, Dr Janann Colonel who wishes transfer to [facility].  May need intubation in next 12-24 hours given progression of disease, currently handling secretions, sats stable.  Discussed with Dr Jamal Collin with critical care, they will see and consult.  LUMBAR PUNCTURE Date/Time: 06/30/2014 3:30 AM Performed by: Kalman Drape Authorized by: Kalman Drape Consent: Verbal consent obtained. Written consent obtained. Risks and benefits: risks, benefits and alternatives were discussed Consent given by: patient Patient understanding: patient states understanding of the procedure being performed Patient consent: the patient's understanding of the procedure matches consent given Procedure consent: procedure consent matches procedure scheduled Relevant documents: relevant documents present and verified Test results: test results available and properly labeled Imaging studies: imaging studies available Required items: required blood products, implants, devices, and special equipment available Patient identity confirmed: verbally with patient Time out: Immediately prior to procedure a "time out" was called to verify the correct patient, procedure, equipment, support staff and site/side marked as required. Indications: evaluation for altered mental status (concern for guillane barre) Anesthesia: local infiltration Local anesthetic: lidocaine 2% without epinephrine Anesthetic total: 4 ml Preparation: Patient was prepped and draped in the usual  sterile fashion. Lumbar space: L4-L5 interspace Patient's position: right lateral decubitus Needle gauge: 20 Needle length: 1.5 in Number of attempts: 1 Opening pressure: 29 cm H2O Fluid appearance: clear Tubes of fluid: 4 Total volume: 5 ml Post-procedure: site cleaned, pressure dressing applied and adhesive bandage applied Patient tolerance: Patient tolerated the procedure well with no immediate complications    Kalman Drape, MD 06/30/14 [phone]

## 2014-06-30 NOTE — Progress Notes (Signed)
SLP Cancellation Note  Patient Details Name: Frank [name] MRN: [id number] DOB: 1956-07-27   Cancelled treatment:       Reason Eval/Treat Not Completed: Medical issues which prohibited therapy. Pt now intubated, will have plasma exchange. SLP will keep orders; pt may benefit from alternative communication techniques while intubated when medically stable.    Jovannie Ulibarri, Katherene Ponto 06/30/2014, 10:16 AM

## 2014-07-01 ENCOUNTER — Inpatient Hospital Stay (HOSPITAL_COMMUNITY): Payer: BC Managed Care – PPO

## 2014-07-01 DIAGNOSIS — R0602 Shortness of breath: Secondary | ICD-10-CM

## 2014-07-01 DIAGNOSIS — E43 Unspecified severe protein-calorie malnutrition: Secondary | ICD-10-CM

## 2014-07-01 LAB — BASIC METABOLIC PANEL
Anion gap: 13 (ref 5–15)
BUN: 33 mg/dL — AB (ref 6–23)
CHLORIDE: 106 meq/L (ref 96–112)
CO2: 26 meq/L (ref 19–32)
CREATININE: 0.89 mg/dL (ref 0.50–1.35)
Calcium: 9.3 mg/dL (ref 8.4–10.5)
GFR calc Af Amer: >90 mL/min (ref >90)
GFR calc non Af Amer: >90 mL/min (ref >90)
Glucose, Bld: 147 mg/dL — ABNORMAL HIGH (ref 70–99)
POTASSIUM: 3.4 meq/L — AB (ref 3.7–5.3)
Sodium: 145 mEq/L (ref 137–147)

## 2014-07-01 LAB — POCT I-STAT 3, ART BLOOD GAS (G3+)
ACID-BASE EXCESS: 2 mmol/L (ref 0.0–2.0)
Bicarbonate: 26.1 mEq/L — ABNORMAL HIGH (ref 20.0–24.0)
O2 Saturation: 98 %
PH ART: 7.439 (ref 7.350–7.450)
TCO2: 27 mmol/L (ref 0–100)
pCO2 arterial: 38.5 mmHg (ref 35.0–45.0)
pO2, Arterial: 93 mmHg (ref 80.0–100.0)

## 2014-07-01 LAB — CBC
HEMATOCRIT: 37.5 % — AB (ref 39.0–52.0)
HEMOGLOBIN: 12.7 g/dL — AB (ref 13.0–17.0)
MCH: 30 pg (ref 26.0–34.0)
MCHC: 33.9 g/dL (ref 30.0–36.0)
MCV: 88.4 fL (ref 78.0–100.0)
Platelets: 213 10*3/uL (ref 150–400)
RBC: 4.24 MIL/uL (ref 4.22–5.81)
RDW: 12.9 % (ref 11.5–15.5)
WBC: 9.5 10*3/uL (ref 4.0–10.5)

## 2014-07-01 LAB — GLUCOSE, CAPILLARY
GLUCOSE-CAPILLARY: 163 mg/dL — AB (ref 70–99)
GLUCOSE-CAPILLARY: 145 mg/dL — AB (ref 70–99)
GLUCOSE-CAPILLARY: 141 mg/dL — AB (ref 70–99)
GLUCOSE-CAPILLARY: 145 mg/dL — AB (ref 70–99)
Glucose-Capillary: 142 mg/dL — ABNORMAL HIGH (ref 70–99)

## 2014-07-01 LAB — URINE CULTURE: Colony Count: 25000

## 2014-07-01 LAB — POCT I-STAT, CHEM 8
BUN: 30 mg/dL — ABNORMAL HIGH (ref 6–23)
CREATININE: 0.9 mg/dL (ref 0.50–1.35)
Calcium, Ion: 1.17 mmol/L (ref 1.13–1.30)
Chloride: 104 mEq/L (ref 96–112)
Glucose, Bld: 141 mg/dL — ABNORMAL HIGH (ref 70–99)
HEMATOCRIT: 40 % (ref 39.0–52.0)
Hemoglobin: 13.6 g/dL (ref 13.0–17.0)
POTASSIUM: 3.7 meq/L (ref 3.7–5.3)
Sodium: 145 mEq/L (ref 137–147)
TCO2: 26 mmol/L (ref 0–100)

## 2014-07-01 LAB — MAGNESIUM: Magnesium: 2.1 mg/dL (ref 1.5–2.5)

## 2014-07-01 LAB — PHOSPHORUS: PHOSPHORUS: 3.8 mg/dL (ref 2.3–4.6)

## 2014-07-01 MED ORDER — ACD FORMULA A 0.73-2.45-2.2 GM/100ML VI SOLN
500.0000 mL | Status: DC
  Filled 2014-07-01: qty 500

## 2014-07-01 MED ORDER — SODIUM CHLORIDE 0.9 % IV SOLN
INTRAVENOUS | Status: AC
  Administered 2014-07-01 (×2): via INTRAVENOUS_CENTRAL
  Filled 2014-07-01 (×3): qty 200

## 2014-07-01 MED ORDER — ACD FORMULA A 0.73-2.45-2.2 GM/100ML VI SOLN
Status: AC
  Administered 2014-07-01: 500 mL via INTRAVENOUS
  Filled 2014-07-01: qty 500

## 2014-07-01 MED ORDER — SODIUM CHLORIDE 0.9 % IV SOLN
Freq: Once | INTRAVENOUS | Status: DC
  Filled 2014-07-01: qty 200

## 2014-07-01 MED ORDER — DIPHENHYDRAMINE HCL 25 MG PO CAPS: 25.0000 mg | ORAL_CAPSULE | Freq: Four times a day (QID) | ORAL | Status: DC | PRN

## 2014-07-01 MED ORDER — ALUM & MAG HYDROXIDE-SIMETH 200-200-20 MG/5ML PO SUSP
30.0000 mL | Freq: Four times a day (QID) | ORAL | Status: DC | PRN
  Administered 2014-07-01 – 2014-07-04 (×5): 30 mL
  Filled 2014-07-01 (×5): qty 30

## 2014-07-01 MED ORDER — FREE WATER
200.0000 mL | Freq: Three times a day (TID) | Status: DC
  Administered 2014-07-01 – 2014-07-03 (×6): 200 mL

## 2014-07-01 MED ORDER — SODIUM CHLORIDE 0.9 % IV SOLN
4.0000 g | Freq: Once | INTRAVENOUS | Status: DC
  Administered 2014-07-01: 4 g via INTRAVENOUS
  Filled 2014-07-01: qty 40

## 2014-07-01 MED ORDER — ANTICOAGULANT SODIUM CITRATE 4% (200MG/5ML) IV SOLN
5.0000 mL | Freq: Once | Status: DC
  Filled 2014-07-01: qty 250

## 2014-07-01 MED ORDER — ACETAMINOPHEN 325 MG PO TABS: 650.0000 mg | ORAL_TABLET | ORAL | Status: DC | PRN

## 2014-07-01 NOTE — Progress Notes (Signed)
Subjective: Intubated, pehresis started.   Exam: Filed Vitals:   07/01/14 0900  BP: 176/82  Pulse: 84  Temp:   Resp:    Gen: In bed, NAD MS: Awake, alert.  SX:[id number], EOMI Motor: 3/5 plantar/dorsi flexion, 4/5 hip flexion, 4/5 hand weakness.  Sensory: absent LT to knee.  CEY:EMVVKP    Impression: 58 yo M with ascending numbness/weakness and pain. 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 Though transverse myelitis could be considered, either way I would favor plasma exchange as a modality of treatment given the severity of symptoms.   Recommendations: 1) PLEX day 2/5 2) MRI C/T spine to rule out other pathology.   Roland Rack, MD [facility] [phone]  If 7pm- 7am, please page neurology on call as listed in [place].

## 2014-07-01 NOTE — Progress Notes (Signed)
NIF -20 VC 1.1L

## 2014-07-01 NOTE — Progress Notes (Signed)
**Note De-Identified  Obfuscation** NIF -20 VC 1.6

## 2014-07-01 NOTE — Progress Notes (Signed)
Had 2nd TPE Tx done. Had some short runs of bigeminy during the procedure that seemed to clear by increasing Ca gluconate infusion rate, otherwise tolerated well.

## 2014-07-01 NOTE — Progress Notes (Signed)
PULMONARY / CRITICAL CARE MEDICINE   Name: Frank [name] MRN: [id number] DOB: Apr 21, 1956    ADMISSION DATE:  06/30/2014 CONSULTATION DATE:  11/20  REFERRING MD :  Neuro  CHIEF COMPLAINT:  Can't walk  INITIAL PRESENTATION:  Progressive weakness  STUDIES:    SIGNIFICANT EVENTS:    HISTORY OF PRESENT ILLNESS:   58 yo WM with progressive ascending neuromuscular weakness over several months who now presents with inability to walk, dysarthria, profound weakness and impending respiratory failure. Neurology has requested PCCM to placed HD catheter for plasma exchange and he needs intubation for airway protection.    PAST MEDICAL HISTORY :   has a past medical history of COLONIC POLYPS (02/01/2008); DIABETES MELLITUS, TYPE II (05/20/2010); HYPERLIPIDEMIA (10/09/2008); ANXIETY DEPRESSION (02/01/2008); ERECTILE DYSFUNCTION (10/09/2008); ADD (10/09/2008); SLEEP APNEA, OBSTRUCTIVE (02/01/2008); MORTON'S NEUROMA (05/20/2010); PERIPHERAL NEUROPATHY (05/20/2010); Other specified forms of hearing loss (06/27/2009); HYPERTENSION (10/09/2008); HEMORRHOIDS (02/01/2008); ALLERGIC RHINITIS (10/09/2008); Stricture and stenosis of esophagus (02/02/2008); GERD (02/01/2008); HIATAL HERNIA (02/01/2008); ERECTILE DYSFUNCTION, ORGANIC (05/20/2010); WRIST PAIN, LEFT (12/05/2009); FOOT PAIN, LEFT (05/20/2010); PERIPHERAL EDEMA (05/20/2010); DYSPNEA (03/12/2010); Abdominal pain, unspecified site (01/19/2009); Abdominal pain, left lower quadrant (06/06/2010); and Type II or unspecified type diabetes mellitus without mention of complication, uncontrolled (11/14/2010).  has past surgical history that includes Carpal tunnel release; Rotator cuff repair; Esophageal dilation (july 2009); Eye surgery; and Esophagogastroduodenoscopy (N/A, 06/27/2014). Prior to Admission medications   Medication Sig Start Date End Date Taking? Authorizing Provider  allopurinol (ZYLOPRIM) 100 MG tablet Take 2 tablets (200 mg total) by mouth daily. 03/27/14  Yes  Lyndal Pulley, DO  amLODipine (NORVASC) 10 MG tablet Take 1 tablet (10 mg total) by mouth daily. 03/27/14  Yes Lyndal Pulley, DO  atorvastatin (LIPITOR) 10 MG tablet Take 10 mg by mouth daily.   Yes Historical Provider, MD  clonazePAM (KLONOPIN) 0.5 MG tablet Take 1 tablet (0.5 mg total) by mouth 2 (two) times daily as needed for anxiety. 06/28/14  Yes Theodis Blaze, MD  colchicine 0.6 MG tablet Take 1 tablet (0.6 mg total) by mouth 2 (two) times daily. 03/27/14  Yes Lyndal Pulley, DO  DULoxetine (CYMBALTA) 30 MG capsule Take 30 mg by mouth daily.    Yes Historical Provider, MD  glipiZIDE (GLUCOTROL XL) 2.5 MG 24 hr tablet Take 1 tablet (2.5 mg total) by mouth daily with breakfast. 05/17/14  Yes Biagio Borg, MD  hydrALAZINE (APRESOLINE) 10 MG tablet Take 1 tablet (10 mg total) by mouth 3 (three) times daily. 06/28/14  Yes Theodis Blaze, MD  levofloxacin (LEVAQUIN) 500 MG tablet Take 1 tablet (500 mg total) by mouth daily. 06/28/14  Yes Theodis Blaze, MD  meloxicam (MOBIC) 15 MG tablet Take 1 tablet (15 mg total) by mouth daily. 03/06/14  Yes Zenaida Niece, PA-C  methylphenidate 54 MG PO CR tablet Take 54 mg by mouth 2 (two) times daily.   Yes Historical Provider, MD  metoCLOPramide (REGLAN) 5 MG tablet Take 1 tablet (5 mg total) by mouth 4 (four) times daily -  before meals and at bedtime. 06/28/14  Yes Theodis Blaze, MD  NUCYNTA 100 MG TABS Take 1 tablet by mouth 3 (three) times daily.  03/24/14  Yes Historical Provider, MD  oxyCODONE-acetaminophen (PERCOCET) 7.5-325 MG per tablet Take 1 tablet by mouth every 6 (six) hours as needed for pain. 06/28/14  Yes Theodis Blaze, MD  pantoprazole (PROTONIX) 40 MG tablet Take 1 tablet (40 mg total) by mouth daily. 06/28/14  Yes Theodis Blaze, MD  pregabalin (LYRICA) 200 MG capsule Take 200 mg by mouth 2 (two) times daily.   Yes Historical Provider, MD  glucose blood (FREESTYLE TEST STRIPS) test strip Use as instructed 06/04/12   Biagio Borg, MD   ondansetron (ZOFRAN) 4 MG tablet Take 1 tablet (4 mg total) by mouth 2 (two) times daily. Patient not taking: Reported on 06/30/2014 06/28/14   Theodis Blaze, MD  ondansetron (ZOFRAN) 4 MG tablet Take 1 tablet (4 mg total) by mouth every 8 (eight) hours as needed for nausea or vomiting. Patient not taking: Reported on 06/30/2014 06/28/14   Theodis Blaze, MD   Allergies  Allergen Reactions  . Codeine Itching    REACTION: Itching  . Metformin And Related Other (See Comments)    GI upset, diarrhea    FAMILY HISTORY:  has no family status information on file.  SOCIAL HISTORY:  reports that he has never smoked. He has never used smokeless tobacco. He reports that he drinks alcohol. He reports that he uses illicit drugs (Methaqualone).  REVIEW OF SYSTEMS:  NA  SUBJECTIVE:   VITAL SIGNS: Temp:  [97.3 F (36.3 C)-98.9 F (37.2 C)] 98.6 F (37 C) (11/21 1200) Pulse Rate:  [53-102] 78 (11/21 1200) Resp:  [11-22] 11 (11/21 1200) BP: (75-197)/(51-100) 188/83 mmHg (11/21 1200) SpO2:  [93 %-100 %] 97 % (11/21 1200) FiO2 (%):  [40 %] 40 % (11/21 1127) Weight:  [84 kg (185 lb 3 oz)-84.1 kg (185 lb 6.5 oz)] 84.1 kg (185 lb 6.5 oz) (11/21 0315) HEMODYNAMICS:   VENTILATOR SETTINGS: Vent Mode:  [-] CPAP FiO2 (%):  [40 %] 40 % Set Rate:  [16 bmp] 16 bmp Vt Set:  [570 mL] 570 mL PEEP:  [5 cmH20] 5 cmH20 Pressure Support:  [5 cmH20] 5 cmH20 Plateau Pressure:  [15 cmH20-17 cmH20] 15 cmH20 INTAKE / OUTPUT:  Intake/Output Summary (Last 24 hours) at 07/01/14 1204 Last data filed at 07/01/14 1100  Gross per 24 hour  Intake 1556.97 ml  Output   1090 ml  Net 466.97 ml    PHYSICAL EXAMINATION: General: WNWDWF Neuro:  Sedated and intubated, responded during wake up assessment however. HEENT:  No JVD /LAN Cardiovascular:  HSR RRR Lungs: Coarse BS diffusely. Abdomen:  +bs Musculoskeletal:  General weakness  Skin:  warm  LABS:  CBC  Recent Labs Lab 06/29/14 0602 06/30/14 0422  06/30/14 1735 07/01/14 0330  WBC 7.7 7.4  --  9.5  HGB 14.4 13.9 13.6 12.7*  HCT 41.3 38.9* 40.0 37.5*  PLT 239 245  --  213   Coag's No results for input(s): APTT, INR in the last 168 hours. BMET  Recent Labs Lab 06/28/14 0433 06/29/14 0602 06/30/14 0422 06/30/14 1735 07/01/14 0330  NA 142 141  --  141 145  K 3.5* 3.9  --  3.3* 3.4*  CL 101 100  --  101 106  CO2 30 27  --   --  26  BUN 31* 25*  --  31* 33*  CREATININE 0.92 0.82 0.86 1.10 0.89  GLUCOSE 133* 142*  --  120* 147*   Electrolytes  Recent Labs Lab 06/28/14 0433 06/29/14 0602 07/01/14 0330  CALCIUM 9.6 9.5 9.3  MG  --   --  2.1  PHOS  --   --  3.8   Sepsis Markers No results for input(s): LATICACIDVEN, PROCALCITON, O2SATVEN in the last 168 hours. ABG  Recent Labs Lab 06/30/14 1136 07/01/14 0245  PHART 7.436 7.439  PCO2ART 46.6* 38.5  PO2ART 354.0* 93.0   Liver Enzymes No results for input(s): AST, ALT, ALKPHOS, BILITOT, ALBUMIN in the last 168 hours. Cardiac Enzymes No results for input(s): TROPONINI, PROBNP in the last 168 hours. Glucose  Recent Labs Lab 06/30/14 1207 06/30/14 1615 06/30/14 2031 06/30/14 2353 07/01/14 0430 07/01/14 0834  GLUCAP 126* 115* 122* 148* 163* 142*    Imaging Ct Head Wo Contrast  06/30/2014   CLINICAL DATA:  Weakness. Unable to ambulate for 3 days. No injury.  EXAM: CT HEAD WITHOUT CONTRAST  CT CERVICAL SPINE WITHOUT CONTRAST  TECHNIQUE: Multidetector CT imaging of the head and cervical spine was performed following the standard protocol without intravenous contrast. Multiplanar CT image reconstructions of the cervical spine were also generated.  COMPARISON:  CT head 06/17/2014  FINDINGS: CT HEAD FINDINGS  Ventricles and sulci appear symmetrical. No mass effect or midline shift. No abnormal extra-axial fluid collections. Gray-white matter junctions are distinct. Basal cisterns are not effaced. No evidence of acute intracranial hemorrhage. No depressed skull  fractures. Visualized paranasal sinuses and mastoid air cells are not opacified.  CT CERVICAL SPINE FINDINGS  Reversal of the usual cervical lordosis which may be due to patient positioning but ligamentous injury or muscle spasm could also have this appearance and are not excluded. Diffuse degenerative change throughout the cervical spine with narrowed cervical interspaces and associated endplate hypertrophic changes. Slight anterior subluxation at C7-T1 which is probably degenerative. Degenerative changes throughout the facet joints. Degenerative changes at C1 to. C1-2 articulation appears intact. No focal bone lesion or bone destruction. Bone cortex and trabecular architecture appear intact. Soft tissues are unremarkable.  IMPRESSION: No acute intracranial abnormalities.  Degenerative changes throughout the cervical spine. Nonspecific reversal of the usual cervical lordosis. No displaced fractures are appreciated.   Electronically Signed   By: Lucienne Capers M.D.   On: 06/30/2014 01:44   Ct Cervical Spine Wo Contrast  06/30/2014   CLINICAL DATA:  Weakness. Unable to ambulate for 3 days. No injury.  EXAM: CT HEAD WITHOUT CONTRAST  CT CERVICAL SPINE WITHOUT CONTRAST  TECHNIQUE: Multidetector CT imaging of the head and cervical spine was performed following the standard protocol without intravenous contrast. Multiplanar CT image reconstructions of the cervical spine were also generated.  COMPARISON:  CT head 06/17/2014  FINDINGS: CT HEAD FINDINGS  Ventricles and sulci appear symmetrical. No mass effect or midline shift. No abnormal extra-axial fluid collections. Gray-white matter junctions are distinct. Basal cisterns are not effaced. No evidence of acute intracranial hemorrhage. No depressed skull fractures. Visualized paranasal sinuses and mastoid air cells are not opacified.  CT CERVICAL SPINE FINDINGS  Reversal of the usual cervical lordosis which may be due to patient positioning but ligamentous injury or  muscle spasm could also have this appearance and are not excluded. Diffuse degenerative change throughout the cervical spine with narrowed cervical interspaces and associated endplate hypertrophic changes. Slight anterior subluxation at C7-T1 which is probably degenerative. Degenerative changes throughout the facet joints. Degenerative changes at C1 to. C1-2 articulation appears intact. No focal bone lesion or bone destruction. Bone cortex and trabecular architecture appear intact. Soft tissues are unremarkable.  IMPRESSION: No acute intracranial abnormalities.  Degenerative changes throughout the cervical spine. Nonspecific reversal of the usual cervical lordosis. No displaced fractures are appreciated.   Electronically Signed   By: Lucienne Capers M.D.   On: 06/30/2014 01:44   Dg Chest Port 1 View  06/30/2014   CLINICAL DATA:  Endotracheal tube  placement.  EXAM: PORTABLE CHEST - 1 VIEW  COMPARISON:  Same day.  FINDINGS: Stable cardiomediastinal silhouette. Endotracheal tube is in grossly good position with distal tip 7 cm above the carina. Nasogastric tube is seen entering the stomach. Interval placement of right internal jugular catheter with distal tip in expected position of the SVC. Minimal right basilar subsegmental atelectasis is noted. Mild left basilar opacity is noted concerning for atelectasis or pneumonia. No pneumothorax or significant pleural effusion is noted.  IMPRESSION: Endotracheal tube in grossly good position. Nasogastric tube and right internal jugular catheter also in good position. Minimal right basilar subsegmental atelectasis. Mild left basilar pneumonia or subsegmental atelectasis is noted.   Electronically Signed   By: Sabino Dick M.D.   On: 06/30/2014 11:16   Dg Chest Port 1 View  06/30/2014   CLINICAL DATA:  Shortness of Breath  EXAM: PORTABLE CHEST - 1 VIEW  COMPARISON:  06/24/2014  FINDINGS: Shallow inspiration with infiltration or atelectasis in the lung bases, increasing  since prior study. Heart size and pulmonary vascularity are normal. No blunting of costophrenic angles. No pneumothorax.  IMPRESSION: Shallow inspiration with linear atelectasis or infiltration in the lung bases.   Electronically Signed   By: Lucienne Capers M.D.   On: 06/30/2014 04:03     ASSESSMENT / PLAN:  NEUROLOGIC A:   Progressive paralysis consistent with GBS  P:   RASS goal: 1 Plasma exchange Sedation with fentanyl drip and versed PRN pushes Other recommendations per neuro.  PULMONARY OETT 1/20>> A: VDRF due to weakness, VC and NIF remain poor. P:   Begin PS trials but no extubation given NIF and VC. Adjust vent for ABG. CXR and ABG in AM.  CARDIOVASCULAR CVL 1/20 rt i j hd cath>> A:  Hx of htn P:  Hold antihypertensives given fluctuant SBP. Monitor BP.  RENAL A:   No acute issue P:   KVO IVF TF Free water BMET in AM  Replace electrolytes as indicated.  GASTROINTESTINAL A:   He will need enteral feeds while intubated and until he can protect his airway. P:   TF per nutrition  HEMATOLOGIC A:   No acute issues P:  CBC daily Transfuse per ICU protocol  INFECTIOUS A:   No indicators for infectious process P:   Monitor Abx and fever curve.  ENDOCRINE A:   Hyperglycemia  P:   SSI  FAMILY  - Updates: No family bedside.  - Inter-disciplinary family meet or Palliative Care meeting due by:  day 7  TODAY'S SUMMARY:  Continue PS as able, continue plasma exchange, no extubation, will continue to follow.  My CC time 35 min.  Rush Farmer, M.D. [facility] Pulmonary/Critical Care Medicine. Pager: [phone]. After hours pager: [phone].

## 2014-07-01 NOTE — Progress Notes (Signed)
PT Cancellation and Discharge Note  Patient Details Name: Arend Bahl MRN: [id number] DOB: 1955/09/03   Cancelled Treatment:    Reason Eval/Treat Not Completed: Patient not medically ready.  Note pt is sedated and intubated.  Will sign off PT at this time and need new orders once pt medically appropriate for mobility.  Thanks.     Kathline Banbury, Thornton Papas 07/01/2014, 10:17 AM

## 2014-07-02 ENCOUNTER — Inpatient Hospital Stay (HOSPITAL_COMMUNITY): Payer: BC Managed Care – PPO

## 2014-07-02 LAB — CSF CELL COUNT WITH DIFFERENTIAL
RBC COUNT CSF: 1 /mm3 — AB
RBC Count, CSF: 1 /mm3 — ABNORMAL HIGH
TUBE #: 4
Tube #: 1
WBC CSF: 1 /mm3 (ref 0–5)
WBC, CSF: 1 /mm3 (ref 0–5)

## 2014-07-02 LAB — BASIC METABOLIC PANEL
ANION GAP: 13 (ref 5–15)
BUN: 30 mg/dL — ABNORMAL HIGH (ref 6–23)
CHLORIDE: 107 meq/L (ref 96–112)
CO2: 25 meq/L (ref 19–32)
Calcium: 9.4 mg/dL (ref 8.4–10.5)
Creatinine, Ser: 0.74 mg/dL (ref 0.50–1.35)
GFR calc non Af Amer: >90 mL/min (ref >90)
Glucose, Bld: 152 mg/dL — ABNORMAL HIGH (ref 70–99)
POTASSIUM: 4.1 meq/L (ref 3.7–5.3)
SODIUM: 145 meq/L (ref 137–147)

## 2014-07-02 LAB — BLOOD GAS, ARTERIAL
Acid-Base Excess: 2.5 mmol/L — ABNORMAL HIGH (ref 0.0–2.0)
Bicarbonate: 26.5 mEq/L — ABNORMAL HIGH (ref 20.0–24.0)
DRAWN BY: 28340
FIO2: 0.4 %
LHR: 16 {breaths}/min
MECHVT: 570 mL
O2 Saturation: 93.5 %
PCO2 ART: 40.2 mmHg (ref 35.0–45.0)
PEEP: 5 cmH2O
PO2 ART: 68.2 mmHg — AB (ref 80.0–100.0)
Patient temperature: 98.6
TCO2: 27.7 mmol/L (ref 0–100)
pH, Arterial: 7.434 (ref 7.350–7.450)

## 2014-07-02 LAB — CBC
HCT: 38.7 % — ABNORMAL LOW (ref 39.0–52.0)
Hemoglobin: 13.2 g/dL (ref 13.0–17.0)
MCH: 30.9 pg (ref 26.0–34.0)
MCHC: 34.1 g/dL (ref 30.0–36.0)
MCV: 90.6 fL (ref 78.0–100.0)
Platelets: 195 10*3/uL (ref 150–400)
RBC: 4.27 MIL/uL (ref 4.22–5.81)
RDW: 13 % (ref 11.5–15.5)
WBC: 14.2 10*3/uL — AB (ref 4.0–10.5)

## 2014-07-02 LAB — GLUCOSE, CAPILLARY
GLUCOSE-CAPILLARY: 110 mg/dL — AB (ref 70–99)
GLUCOSE-CAPILLARY: 146 mg/dL — AB (ref 70–99)
Glucose-Capillary: 171 mg/dL — ABNORMAL HIGH (ref 70–99)
Glucose-Capillary: 143 mg/dL — ABNORMAL HIGH (ref 70–99)
Glucose-Capillary: 144 mg/dL — ABNORMAL HIGH (ref 70–99)

## 2014-07-02 LAB — PHOSPHORUS: PHOSPHORUS: 3.1 mg/dL (ref 2.3–4.6)

## 2014-07-02 LAB — MAGNESIUM: Magnesium: 2 mg/dL (ref 1.5–2.5)

## 2014-07-02 NOTE — Progress Notes (Signed)
NIF -20 VC 1.4L

## 2014-07-02 NOTE — Progress Notes (Signed)
Subjective: Intubated, pehresis started.   Exam: Filed Vitals:   07/02/14 1159  BP: 163/78  Pulse: 101  Temp:   Resp: 16   Gen: In bed, NAD MS: Awake, alert.  KK:XFGHW, EOMI Motor: 3/5 plantar/dorsi flexion, 4/5 hip flexion, 4/5 hand weakness.  Sensory: absent LT to knee.  EXH:[id number]    Impression: 58 yo M with ascending numbness/weakness and pain. 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 Though transverse myelitis could be considered, either way I would favor plasma exchange as a modality of treatment given the severity of symptoms.   Recommendations: 1) PLEX day 3/5 tomorrow 2) MRI C/T spine pending  Roland Rack, MD [facility] [phone]  If 7pm- 7am, please page neurology on call as listed in [place].

## 2014-07-02 NOTE — Progress Notes (Signed)
PULMONARY / CRITICAL CARE MEDICINE   Name: Frank [name] MRN: [id number] DOB: [birth year]    ADMISSION DATE:  06/30/2014 CONSULTATION DATE:  11/20  REFERRING MD :  Neuro  CHIEF COMPLAINT:  Can't walk  INITIAL PRESENTATION:  58 yo WM with progressive ascending neuromuscular weakness over several months who now presents with inability to walk, dysarthria, profound weakness and impending respiratory failure. Neurology has requested PCCM to placed HD catheter for plasma exchange and he needs intubation for airway protection.   STUDIES:    SIGNIFICANT EVENTS: 11/20 intubated for progressive weakness and plasmapheresis started.  SUBJECTIVE: No events overnight, weaning.  VITAL SIGNS: Temp:  [97.7 F (36.5 C)-98.9 F (37.2 C)] 97.7 F (36.5 C) (11/22 0036) Pulse Rate:  [58-107] 101 (11/22 1000) Resp:  [10-23] 22 (11/22 0913) BP: (103-209)/(47-106) 168/88 mmHg (11/22 1000) SpO2:  [94 %-100 %] 96 % (11/22 1000) FiO2 (%):  [40 %] 40 % (11/22 0913) Weight:  [85.5 kg (188 lb 7.9 oz)] 85.5 kg (188 lb 7.9 oz) (11/22 0356) HEMODYNAMICS:   VENTILATOR SETTINGS: Vent Mode:  [-] PSV;CPAP FiO2 (%):  [40 %] 40 % Set Rate:  [16 bmp] 16 bmp Vt Set:  [570 mL] 570 mL PEEP:  [5 cmH20] 5 cmH20 Pressure Support:  [5 cmH20-8 cmH20] 5 cmH20 Plateau Pressure:  [16 cmH20-17 cmH20] 17 cmH20 INTAKE / OUTPUT:  Intake/Output Summary (Last 24 hours) at 07/02/14 1117 Last data filed at 07/02/14 1000  Gross per 24 hour  Intake 2055.93 ml  Output   1740 ml  Net 315.93 ml    PHYSICAL EXAMINATION: General: WNWDWF Neuro:  Sedated and intubated, responded during wake up assessment however. HEENT:  No JVD /LAN Cardiovascular:  HSR RRR Lungs: Coarse BS diffusely. Abdomen:  +bs Musculoskeletal:  General weakness  Skin:  warm  LABS:  CBC  Recent Labs Lab 06/30/14 0422  07/01/14 0330 07/01/14 1129 07/02/14 0244  WBC 7.4  --  9.5  --  14.2*  HGB 13.9  < > 12.7* 13.6 13.2  HCT 38.9*  < >  37.5* 40.0 38.7*  PLT 245  --  213  --  195  < > = values in this interval not displayed. Coag's No results for input(s): APTT, INR in the last 168 hours. BMET  Recent Labs Lab 06/29/14 0602  07/01/14 0330 07/01/14 1129 07/02/14 0244  NA 141  < > 145 145 145  K 3.9  < > 3.4* 3.7 4.1  CL 100  < > 106 104 107  CO2 27  --  26  --  25  BUN 25*  < > 33* 30* 30*  CREATININE 0.82  < > 0.89 0.90 0.74  GLUCOSE 142*  < > 147* 141* 152*  < > = values in this interval not displayed. Electrolytes  Recent Labs Lab 06/29/14 0602 07/01/14 0330 07/02/14 0244  CALCIUM 9.5 9.3 9.4  MG  --  2.1 2.0  PHOS  --  3.8 3.1   Sepsis Markers No results for input(s): LATICACIDVEN, PROCALCITON, O2SATVEN in the last 168 hours. ABG  Recent Labs Lab 06/30/14 1136 07/01/14 0245 07/02/14 0404  PHART 7.436 7.439 7.434  PCO2ART 46.6* 38.5 40.2  PO2ART 354.0* 93.0 68.2*   Liver Enzymes No results for input(s): AST, ALT, ALKPHOS, BILITOT, ALBUMIN in the last 168 hours. Cardiac Enzymes No results for input(s): TROPONINI, PROBNP in the last 168 hours. Glucose  Recent Labs Lab 07/01/14 1214 07/01/14 1614 07/01/14 2125 07/02/14 0031 07/02/14 0435 07/02/14 0744  GLUCAP  145* 141* 145* 110* 146* 171*    Imaging Dg Chest Port 1 View  07/01/2014   CLINICAL DATA:  Endotracheal tube placement.  Weakness.  EXAM: PORTABLE CHEST - 1 VIEW  COMPARISON:  Multiple exams, including 06/30/2014  FINDINGS: Endotracheal tube tip 4.5 cm above the carina. Nasogastric tube enters the stomach. Right IJ line tip: SVC.  The patient is rotated to the Left on today's radiograph, reducing diagnostic sensitivity and specificity. Low lung volumes are present, causing crowding of the pulmonary vasculature. Left basilar airspace opacity mostly persists, although is mildly improved. Accounting for rotation, the right lung appears clear. No pneumothorax.  IMPRESSION: 1. There is some persistent retrocardiac airspace opacity  although this appears mildly improved compared to the prior exam. 2. Tubes and lines appear satisfactorily positioned.   Electronically Signed   By: Sherryl Barters M.D.   On: 07/01/2014 08:21     ASSESSMENT / PLAN:  NEUROLOGIC A:   Progressive paralysis consistent with GBS  P:   RASS goal: 1 Plasma exchange per neuro recommendations Sedation with fentanyl drip and versed PRN pushes Other recommendations per neuro.  PULMONARY OETT 1/20>> A: VDRF due to weakness, VC and NIF remain poor. P:   Maintain on PS as tolerated but no extubation given NIF and VC. Adjust vent for ABG. CXR and ABG in AM.  CARDIOVASCULAR CVL 1/20 rt i j hd cath>> A:  Hx of htn P:  Hold antihypertensives given fluctuant SBP. Monitor BP.  RENAL A:   No acute issue P:   KVO IVF TF Free water BMET in AM  Replace electrolytes as indicated.  GASTROINTESTINAL A:   He will need enteral feeds while intubated and until he can protect his airway. P:   TF per nutrition  HEMATOLOGIC A:   No acute issues P:  CBC daily Transfuse per ICU protocol  INFECTIOUS A:   No indicators for infectious process P:   Monitor Abx and fever curve.  ENDOCRINE A:   Hyperglycemia  P:   SSI  FAMILY  - Updates: No family bedside.  - Inter-disciplinary family meet or Palliative Care meeting due by:  day 7  TODAY'S SUMMARY:  Continue on PS trials, no extubation until Tuesday per neuro's recommendations.  NIF and VC remain low however.  Continue TF and free water.  My CC time 35 min.  Rush Farmer, M.D. [facility] Pulmonary/Critical Care Medicine. Pager: [phone]. After hours pager: [phone].

## 2014-07-02 NOTE — Progress Notes (Signed)
**Note De-Identified  Obfuscation** NIF -20 VC 1.4L

## 2014-07-03 ENCOUNTER — Inpatient Hospital Stay (HOSPITAL_COMMUNITY): Payer: BC Managed Care – PPO

## 2014-07-03 DIAGNOSIS — E872 Acidosis: Secondary | ICD-10-CM

## 2014-07-03 DIAGNOSIS — J9601 Acute respiratory failure with hypoxia: Principal | ICD-10-CM

## 2014-07-03 DIAGNOSIS — J9602 Acute respiratory failure with hypercapnia: Secondary | ICD-10-CM | POA: Diagnosis present

## 2014-07-03 DIAGNOSIS — Z9289 Personal history of other medical treatment: Secondary | ICD-10-CM

## 2014-07-03 DIAGNOSIS — R52 Pain, unspecified: Secondary | ICD-10-CM

## 2014-07-03 LAB — BLOOD GAS, ARTERIAL
ACID-BASE EXCESS: 5.7 mmol/L — AB (ref 0.0–2.0)
Bicarbonate: 29.8 mEq/L — ABNORMAL HIGH (ref 20.0–24.0)
DRAWN BY: 283401
FIO2: 0.4 %
LHR: 16 {breaths}/min
O2 Saturation: 94.2 %
PEEP: 5 cmH2O
PH ART: 7.441 (ref 7.350–7.450)
Patient temperature: 98.6
TCO2: 31.2 mmol/L (ref 0–100)
VT: 570 mL
pCO2 arterial: 44.5 mmHg (ref 35.0–45.0)
pO2, Arterial: 68.9 mmHg — ABNORMAL LOW (ref 80.0–100.0)

## 2014-07-03 LAB — BASIC METABOLIC PANEL
Anion gap: 12 (ref 5–15)
BUN: 33 mg/dL — AB (ref 6–23)
CO2: 29 mEq/L (ref 19–32)
CREATININE: 0.81 mg/dL (ref 0.50–1.35)
Calcium: 9.2 mg/dL (ref 8.4–10.5)
Chloride: 104 mEq/L (ref 96–112)
GFR calc Af Amer: >90 mL/min (ref >90)
Glucose, Bld: 136 mg/dL — ABNORMAL HIGH (ref 70–99)
Potassium: 3.6 mEq/L — ABNORMAL LOW (ref 3.7–5.3)
Sodium: 145 mEq/L (ref 137–147)

## 2014-07-03 LAB — POCT I-STAT, CHEM 8
BUN: 29 mg/dL — AB (ref 6–23)
Calcium, Ion: 1.25 mmol/L (ref 1.13–1.30)
Chloride: 102 mEq/L (ref 96–112)
Creatinine, Ser: 0.8 mg/dL (ref 0.50–1.35)
GLUCOSE: 137 mg/dL — AB (ref 70–99)
HCT: 34 % — ABNORMAL LOW (ref 39.0–52.0)
HEMOGLOBIN: 11.6 g/dL — AB (ref 13.0–17.0)
POTASSIUM: 3.5 meq/L — AB (ref 3.7–5.3)
Sodium: 143 mEq/L (ref 137–147)
TCO2: 28 mmol/L (ref 0–100)

## 2014-07-03 LAB — CSF CULTURE
CULTURE: NO GROWTH
GRAM STAIN: NONE SEEN

## 2014-07-03 LAB — GLUCOSE, CAPILLARY
GLUCOSE-CAPILLARY: 155 mg/dL — AB (ref 70–99)
Glucose-Capillary: 153 mg/dL — ABNORMAL HIGH (ref 70–99)
Glucose-Capillary: 145 mg/dL — ABNORMAL HIGH (ref 70–99)
Glucose-Capillary: 139 mg/dL — ABNORMAL HIGH (ref 70–99)
Glucose-Capillary: 125 mg/dL — ABNORMAL HIGH (ref 70–99)
Glucose-Capillary: 125 mg/dL — ABNORMAL HIGH (ref 70–99)

## 2014-07-03 LAB — PHOSPHORUS: Phosphorus: 2.6 mg/dL (ref 2.3–4.6)

## 2014-07-03 LAB — EPSTEIN BARR VRS(EBV DNA BY PCR)

## 2014-07-03 LAB — CBC
HEMATOCRIT: 32.9 % — AB (ref 39.0–52.0)
Hemoglobin: 11.2 g/dL — ABNORMAL LOW (ref 13.0–17.0)
MCH: 30.3 pg (ref 26.0–34.0)
MCHC: 34 g/dL (ref 30.0–36.0)
MCV: 88.9 fL (ref 78.0–100.0)
Platelets: 196 10*3/uL (ref 150–400)
RBC: 3.7 MIL/uL — ABNORMAL LOW (ref 4.22–5.81)
RDW: 13 % (ref 11.5–15.5)
WBC: 13.6 10*3/uL — ABNORMAL HIGH (ref 4.0–10.5)

## 2014-07-03 LAB — CSF CULTURE W GRAM STAIN: Special Requests: NORMAL

## 2014-07-03 LAB — MAGNESIUM: Magnesium: 2.4 mg/dL (ref 1.5–2.5)

## 2014-07-03 MED ORDER — ACD FORMULA A 0.73-2.45-2.2 GM/100ML VI SOLN
Status: AC
  Administered 2014-07-03: 500 mL via INTRAVENOUS
  Filled 2014-07-03: qty 1000

## 2014-07-03 MED ORDER — ACETAMINOPHEN 325 MG PO TABS: 650.0000 mg | ORAL_TABLET | ORAL | Status: DC | PRN

## 2014-07-03 MED ORDER — LORAZEPAM 2 MG/ML IJ SOLN
INTRAMUSCULAR | Status: AC
Start: 2014-07-03 — End: 2014-07-03
  Administered 2014-07-03: 2 mg via INTRAVENOUS
  Filled 2014-07-03: qty 1

## 2014-07-03 MED ORDER — HEPARIN SODIUM (PORCINE) 1000 UNIT/ML IJ SOLN
1000.0000 [IU] | Freq: Once | INTRAMUSCULAR | Status: DC
  Filled 2014-07-03: qty 1

## 2014-07-03 MED ORDER — SODIUM CHLORIDE 0.9 % IV SOLN
INTRAVENOUS | Status: AC
  Administered 2014-07-03 (×3): via INTRAVENOUS_CENTRAL
  Filled 2014-07-03 (×3): qty 200

## 2014-07-03 MED ORDER — DIPHENHYDRAMINE HCL 25 MG PO CAPS
25.0000 mg | ORAL_CAPSULE | Freq: Four times a day (QID) | ORAL | Status: DC | PRN
  Administered 2014-07-05: 25 mg via ORAL
  Filled 2014-07-03: qty 1

## 2014-07-03 MED ORDER — POLYETHYLENE GLYCOL 3350 17 G PO PACK
17.0000 g | PACK | Freq: Every day | ORAL | Status: DC
  Administered 2014-07-03 – 2014-07-04 (×2): 17 g via ORAL
  Filled 2014-07-03 (×5): qty 1

## 2014-07-03 MED ORDER — ACD FORMULA A 0.73-2.45-2.2 GM/100ML VI SOLN
500.0000 mL | Status: DC
  Administered 2014-07-03 – 2014-07-07 (×2): 500 mL via INTRAVENOUS
  Filled 2014-07-03 (×2): qty 500

## 2014-07-03 MED ORDER — DOCUSATE SODIUM 50 MG/5ML PO LIQD
100.0000 mg | Freq: Two times a day (BID) | ORAL | Status: DC
  Administered 2014-07-03 – 2014-07-08 (×5): 100 mg via ORAL
  Filled 2014-07-03 (×14): qty 10

## 2014-07-03 MED ORDER — CALCIUM CARBONATE ANTACID 500 MG PO CHEW
2.0000 | CHEWABLE_TABLET | ORAL | Status: AC
  Administered 2014-07-03: 400 mg via ORAL
  Filled 2014-07-03 (×2): qty 2

## 2014-07-03 MED ORDER — LORAZEPAM 2 MG/ML IJ SOLN
2.0000 mg | Freq: Once | INTRAMUSCULAR | Status: AC
  Administered 2014-07-03: 2 mg via INTRAVENOUS

## 2014-07-03 MED ORDER — SODIUM CHLORIDE 0.9 % IV SOLN
4.0000 g | Freq: Once | INTRAVENOUS | Status: AC
  Administered 2014-07-03: 4 g via INTRAVENOUS
  Filled 2014-07-03: qty 40

## 2014-07-03 MED ORDER — ALBUMIN HUMAN 25 % IV SOLN
50.0000 g | INTRAVENOUS | Status: AC
  Filled 2014-07-03 (×3): qty 200

## 2014-07-03 NOTE — Progress Notes (Signed)
Wasted 15cc of Versed gtt with Blase Mess, RN; Waste in sink.

## 2014-07-03 NOTE — Clinical Social Work Note (Signed)
Clinical Social Work Department BRIEF PSYCHOSOCIAL ASSESSMENT 07/03/2014  Patient:  Frank [name], Frank [name]     Account Number:  [id number]     Admit date:  06/30/2014  Clinical Social Worker:  Myles Lipps  Date/Time:  07/03/2014 11:11 AM  Referred by:  Physician  Date Referred:  07/03/2014 Referred for  SNF Placement   Other Referral:   Interview type:  Family Other interview type:   Spoke with patient partner over the phone    PSYCHOSOCIAL DATA Living Status:  FACILITY Admitted from facility:  [facility] [place] Level of care:  [place] Primary support name:  Frank [name]  [phone] Primary support relationship to patient:  SPOUSE Degree of support available:   Strong    CURRENT CONCERNS Current Concerns  Post-Acute Placement   Other Concerns:    SOCIAL WORK ASSESSMENT / PLAN Clinical Social Worker spoke with patient significant other over the phone to offer support and discuss patient needs at discharge.  Patient from [facility]. Patient significant other states that he does not want to deny the option of patient return to [facility] but would love to explore other options.  CSW to complete FL2 and initiate new search once patient is no longer intubated.  Patient to receive two more treatments of Plasmapheresis prior to extubation.  Patient significant other open to further options in [facility].  CSW remains available for support and to assist with discharge planning needs once appropriate.   Assessment/plan status:  Psychosocial Support/Ongoing Assessment of Needs Other assessment/ plan:   Information/referral to community resources:   Holiday representative to provide patient and significant other with available bed offers and facility list once appropriate    PATIENT'S/FAMILY'S RESPONSE TO PLAN OF CARE: Patient alert, however still remains on the ventilator and unable to speak.  Patient with good support from  significant other Frank [name]) and his brother who lives in [place]. Patient significant other states that patient will definitely require SNF at discharge and is in agreement with this plan.  Patient significant other understanding of social work role and appreciative for support and involvement.

## 2014-07-03 NOTE — Progress Notes (Signed)
PULMONARY / CRITICAL CARE MEDICINE   Name: Frank [name] MRN: [id number] DOB: [birth year]    ADMISSION DATE:  06/30/2014 CONSULTATION DATE:  11/20  REFERRING MD :  Neuro  CHIEF COMPLAINT:  Can't walk  INITIAL PRESENTATION:  58 yo WM with progressive ascending neuromuscular weakness over several months who now presents with inability to walk, dysarthria, profound weakness and impending respiratory failure. Neurology has requested PCCM to placed HD catheter for plasma exchange and he needs intubation for airway protection.   STUDIES:  11/20 Head CT >> no acute intracranial abnormalities, degenerative changes throughout cervical spine  SIGNIFICANT EVENTS: 11/11 - 11/18:  Admitted for N, V, HCAP, Dysphagia, Chronic back pain, Urinary retention and generalized weakness 11/20:  Intubated for progressive weakness/ascending paralysis and plasmapheresis started.  SUBJECTIVE: No events overnight, weaning.  VITAL SIGNS: Temp:  [97.9 F (36.6 C)-98.2 F (36.8 C)] 97.9 F (36.6 C) (11/23 0000) Pulse Rate:  [76-107] 83 (11/23 0700) Resp:  [9-33] 19 (11/23 0700) BP: (90-178)/(44-95) 150/95 mmHg (11/23 0700) SpO2:  [93 %-99 %] 97 % (11/23 0700) FiO2 (%):  [40 %] 40 % (11/23 0343) Weight:  [188 lb (85.276 kg)-188 lb 11.4 oz (85.6 kg)] 188 lb 11.4 oz (85.6 kg) (11/23 0600) HEMODYNAMICS:   VENTILATOR SETTINGS: Vent Mode:  [-] PRVC FiO2 (%):  [40 %] 40 % Set Rate:  [16 bmp] 16 bmp Vt Set:  [570 mL] 570 mL PEEP:  [5 cmH20] 5 cmH20 Pressure Support:  [5 cmH20-8 cmH20] 8 cmH20 Plateau Pressure:  [21 cmH20-22 cmH20] 21 cmH20 INTAKE / OUTPUT:  Intake/Output Summary (Last 24 hours) at 07/03/14 0734 Last data filed at 07/03/14 0600  Gross per 24 hour  Intake 1173.02 ml  Output   1250 ml  Net -76.98 ml    PHYSICAL EXAMINATION: General: on vent alert Neuro:  rass 0 awake, cooperative, very alert HEENT:  No JVD /LAN Cardiovascular:  HSR RRR Lungs: CTA Abdomen:  +bs, no  r/g Musculoskeletal:  General weakness  Skin:  warm  LABS:  CBC  Recent Labs Lab 07/01/14 0330 07/01/14 1129 07/02/14 0244 07/03/14 0530  WBC 9.5  --  14.2* 13.6*  HGB 12.7* 13.6 13.2 11.2*  HCT 37.5* 40.0 38.7* 32.9*  PLT 213  --  195 196   Coag's No results for input(s): APTT, INR in the last 168 hours. BMET  Recent Labs Lab 07/01/14 0330 07/01/14 1129 07/02/14 0244 07/03/14 0530  NA 145 145 145 145  K 3.4* 3.7 4.1 3.6*  CL 106 104 107 104  CO2 26  --  25 29  BUN 33* 30* 30* 33*  CREATININE 0.89 0.90 0.74 0.81  GLUCOSE 147* 141* 152* 136*   Electrolytes  Recent Labs Lab 07/01/14 0330 07/02/14 0244 07/03/14 0530  CALCIUM 9.3 9.4 9.2  MG 2.1 2.0 2.4  PHOS 3.8 3.1 2.6   Sepsis Markers No results for input(s): LATICACIDVEN, PROCALCITON, O2SATVEN in the last 168 hours. ABG  Recent Labs Lab 07/01/14 0245 07/02/14 0404 07/03/14 0348  PHART 7.439 7.434 7.441  PCO2ART 38.5 40.2 44.5  PO2ART 93.0 68.2* 68.9*   Liver Enzymes No results for input(s): AST, ALT, ALKPHOS, BILITOT, ALBUMIN in the last 168 hours. Cardiac Enzymes No results for input(s): TROPONINI, PROBNP in the last 168 hours. Glucose  Recent Labs Lab 07/02/14 0435 07/02/14 0744 07/02/14 1139 07/02/14 2006 07/03/14 0004 07/03/14 0405  GLUCAP 146* 171* 143* 144* 155* 153*    Imaging Mr Cervical Spine Wo Contrast  07/02/2014   CLINICAL DATA:  Descending numbness and weakness, pain. Possible Guillain-Barre syndrome versus transverse myelitis.  EXAM: MRI CERVICAL AND THORACIC SPINE WITHOUT CONTRAST  TECHNIQUE: Multiplanar and multiecho pulse sequences of the cervical spine, to include the craniocervical junction and cervicothoracic junction, and thoracic spine, were obtained without intravenous contrast.  COMPARISON:  CT of the head and cervical spine June 30, 2014  FINDINGS: MRI CERVICAL SPINE FINDINGS  Cervical vertebral bodies and posterior elements appear intact. Straightened  cervical lordosis. 2 mm grade 1 C7-T1 anterolisthesis. Mild C4-5, moderate C5-6 and mild C6-7 degenerative discs. Moderate subacute to chronic discogenic endplate changes W2-9 and C5-6, mild to moderate C6-7. Mild subacute discogenic endplate changes H3-7. No STIR signal abnormality to suggest acute osseous process.  Cervical spinal cord appears normal morphology and signal characteristics from the cervicomedullary junction to at least the level T1-2 Common the most caudal well visualized level. Craniocervical junction is intact. Life support lines in place (endotracheal and nasogastric tube), partially imaged RIGHT internal jugular central venous catheter partially characterized.  Level by level evaluation:  C1-2: 2 mm broad-based disc bulge asymmetric to the RIGHT. Uncal vertebral hypertrophy. Severe RIGHT and mild LEFT facet arthropathy. No canal stenosis. Moderate to severe RIGHT neural foraminal narrowing.  C3-4: Uncovertebral hypertrophy and mild facet arthropathy. Mild canal stenosis. Mild LEFT neural foraminal narrowing.  C4-5: 3 mm broad-based disc bulge, uncovertebral hypertrophy and mild facet arthropathy. Mild to moderate canal stenosis. Moderate to severe RIGHT, moderate LEFT neural foraminal narrowing.  C5-6: 2 mm broad-based disc bulge, uncovertebral hypertrophy and mild to moderate facet arthropathy. Moderate canal stenosis. Severe bilateral neural foraminal narrowing.  C6-7: 2 mm broad-based disc bulge, uncovertebral hypertrophy and mild facet arthropathy. Mild canal stenosis. Moderate to severe RIGHT, severe LEFT neural foraminal narrowing.  C7-T1: Anterolisthesis. Moderate facet arthropathy without canal stenosis or neural foraminal narrowing.  MRI THORACIC SPINE FINDINGS  Vertebral bodies and posterior elements are intact and aligned and maintenance of thoracic kyphosis. Intervertebral disc morphology is generally preserved with decreased T2 signal consistent with mild desiccation, superimposed  mineralization of this T6-7, and T8-9 discs. Mild chronic discogenic endplate changes and mid thoracic spine. No STIR signal abnormality to suggest acute osseous process.  Thoracic spinal cord appears normal morphology and signal characteristics level the conus medullaris which is partially imaged at L1. Included view of the chest demonstrates consolidated lung bases with small pleural effusions. Paraspinal soft tissues are demonstrated asymmetric RIGHT muscle atrophy versus denervation.  At T7-8 LEFT central, probably acute 3 mm disc protrusion with annular fissure deforms the LEFT ventral spinal cord. No canal stenosis. No neural foraminal narrowing at any thoracic level.  IMPRESSION: MRI CERVICAL SPINE: Normal noncontrast MRI of the cervical spinal cord, if clinical concern persists for cord pathology, contrast enhanced sequences would be more sensitive for acute changes.  Cervical spondylosis without acute fracture or malalignment.  Moderate canal stenosis C5-6, mild to moderate at C4-5. Neural foraminal narrowing C1-2 thru C6-7: Severe on the RIGHT at C1-2, bilaterally at C5-6 and on the LEFT at C6-7.  MRI SPINE: No abnormal thoracic spinal cord signal, if clinical concern persists for cord pathology, contrast enhanced sequences would be more sensitive for acute changes.  Thoracic spondylosis without acute fracture or malalignment.  LEFT central T7-8 acute appearing disc protrusion deforms the ventral spinal cord.  Dense consolidation in the lung bases most consistent with pneumonia.   Electronically Signed   By: Elon Alas   On: 07/02/2014 19:05   Mr Thoracic Spine Wo Contrast  07/02/2014  CLINICAL DATA:  Descending numbness and weakness, pain. Possible Guillain-Barre syndrome versus transverse myelitis.  EXAM: MRI CERVICAL AND THORACIC SPINE WITHOUT CONTRAST  TECHNIQUE: Multiplanar and multiecho pulse sequences of the cervical spine, to include the craniocervical junction and cervicothoracic  junction, and thoracic spine, were obtained without intravenous contrast.  COMPARISON:  CT of the head and cervical spine June 30, 2014  FINDINGS: MRI CERVICAL SPINE FINDINGS  Cervical vertebral bodies and posterior elements appear intact. Straightened cervical lordosis. 2 mm grade 1 C7-T1 anterolisthesis. Mild C4-5, moderate C5-6 and mild C6-7 degenerative discs. Moderate subacute to chronic discogenic endplate changes Z3-6 and C5-6, mild to moderate C6-7. Mild subacute discogenic endplate changes U4-4. No STIR signal abnormality to suggest acute osseous process.  Cervical spinal cord appears normal morphology and signal characteristics from the cervicomedullary junction to at least the level T1-2 Common the most caudal well visualized level. Craniocervical junction is intact. Life support lines in place (endotracheal and nasogastric tube), partially imaged RIGHT internal jugular central venous catheter partially characterized.  Level by level evaluation:  C1-2: 2 mm broad-based disc bulge asymmetric to the RIGHT. Uncal vertebral hypertrophy. Severe RIGHT and mild LEFT facet arthropathy. No canal stenosis. Moderate to severe RIGHT neural foraminal narrowing.  C3-4: Uncovertebral hypertrophy and mild facet arthropathy. Mild canal stenosis. Mild LEFT neural foraminal narrowing.  C4-5: 3 mm broad-based disc bulge, uncovertebral hypertrophy and mild facet arthropathy. Mild to moderate canal stenosis. Moderate to severe RIGHT, moderate LEFT neural foraminal narrowing.  C5-6: 2 mm broad-based disc bulge, uncovertebral hypertrophy and mild to moderate facet arthropathy. Moderate canal stenosis. Severe bilateral neural foraminal narrowing.  C6-7: 2 mm broad-based disc bulge, uncovertebral hypertrophy and mild facet arthropathy. Mild canal stenosis. Moderate to severe RIGHT, severe LEFT neural foraminal narrowing.  C7-T1: Anterolisthesis. Moderate facet arthropathy without canal stenosis or neural foraminal narrowing.   MRI THORACIC SPINE FINDINGS  Vertebral bodies and posterior elements are intact and aligned and maintenance of thoracic kyphosis. Intervertebral disc morphology is generally preserved with decreased T2 signal consistent with mild desiccation, superimposed mineralization of this T6-7, and T8-9 discs. Mild chronic discogenic endplate changes and mid thoracic spine. No STIR signal abnormality to suggest acute osseous process.  Thoracic spinal cord appears normal morphology and signal characteristics level the conus medullaris which is partially imaged at L1. Included view of the chest demonstrates consolidated lung bases with small pleural effusions. Paraspinal soft tissues are demonstrated asymmetric RIGHT muscle atrophy versus denervation.  At T7-8 LEFT central, probably acute 3 mm disc protrusion with annular fissure deforms the LEFT ventral spinal cord. No canal stenosis. No neural foraminal narrowing at any thoracic level.  IMPRESSION: MRI CERVICAL SPINE: Normal noncontrast MRI of the cervical spinal cord, if clinical concern persists for cord pathology, contrast enhanced sequences would be more sensitive for acute changes.  Cervical spondylosis without acute fracture or malalignment.  Moderate canal stenosis C5-6, mild to moderate at C4-5. Neural foraminal narrowing C1-2 thru C6-7: Severe on the RIGHT at C1-2, bilaterally at C5-6 and on the LEFT at C6-7.  MRI SPINE: No abnormal thoracic spinal cord signal, if clinical concern persists for cord pathology, contrast enhanced sequences would be more sensitive for acute changes.  Thoracic spondylosis without acute fracture or malalignment.  LEFT central T7-8 acute appearing disc protrusion deforms the ventral spinal cord.  Dense consolidation in the lung bases most consistent with pneumonia.   Electronically Signed   By: Elon Alas   On: 07/02/2014 19:05   Dg Chest Port 1  View  07/02/2014   CLINICAL DATA:  Intubation  EXAM: PORTABLE CHEST - 1 VIEW   COMPARISON:  Portable exam 0525 hr compared to 07/01/2014  FINDINGS: Tip of endotracheal tube projects approximately 4.3 cm above carina.  Nasogastric tube extends into stomach.  RIGHT jugular central venous catheter tip projects mid SVC.  Normal heart size, mediastinal contours and pulmonary vascularity.  Bibasilar atelectasis.  No definite infiltrate, pleural effusion or pneumothorax.  IMPRESSION: Bibasilar atelectasis.   Electronically Signed   By: Lavonia Dana M.D.   On: 07/02/2014 07:33     ASSESSMENT / PLAN:  NEUROLOGIC A:   Progressive paralysis -  GBS vs transverse myelitis (per neuro) P:   RASS goal: 0 Plasma exchange - 3/5 completed Sedation with fentanyl drip with absolute WUA Neuro consulting Assess nif / vc  PULMONARY OETT 1/20>> A: VDRF due to ascending paralysis (see neuro) P:   Weaning trial today cpap 5 ps5, goal 2 hr, not for extubation until treatment completion  ABG reviewed, rate reduction pcx rin am to re assess linear atx changes noted Nif/ vc assessment daily  CARDIOVASCULAR CVL 1/20 rt IJ HD cath>> At risk autonomic  dysfxn A:  Hx of htn P:  Hold antihypertensives given fluctuant SBP. Labetolol ordererd PRN Monitor BP.  RENAL A:   No acute issue P:   KVO IVF Free water - dc BMET in AM   GASTROINTESTINAL A:   He will need enteral feeds while intubated and until he can protect his airway. P:   TF per nutrition Evaluate for BM,  May need addition colace, dulcolax, miralax  HEMATOLOGIC A:   No acute issues P:  Heparin SQ for DVT prophylaxis  CBC in am  Transfuse per ICU protocol  INFECTIOUS A:   Leukocytosis P:   Monitor Abx and fever curve.  ENDOCRINE A:   Hyperglycemia  P:   SSI   Ccm time 30 min  Summary: for plasmaxchange in am , wean, wua, sbt, dc free water, need a BM,   Lavon Paganini. Titus Mould, MD, [facility] Pgr: [facility] \]

## 2014-07-03 NOTE — Progress Notes (Signed)
NIF -18 VC 2.1L

## 2014-07-03 NOTE — Progress Notes (Signed)
UR completed.  Qamar Aughenbaugh, RN BSN M[facility] CCM Trauma/Neuro ICU Case Manager [phone]  

## 2014-07-03 NOTE — Progress Notes (Signed)
Patient disconntected from vent and then got very anxious and tachycardic. Nurses have reassured  Plan Ativan 2mg  IV x 1  Dr. Brand Males, M.D., [facility].C.P Pulmonary and Critical Care Medicine Staff Physician [facility] Pager: [phone], If no answer or between  15:00h - 7:00h: call [phone]  07/03/2014 5:53 PM

## 2014-07-03 NOTE — Progress Notes (Signed)
Pt popped self off of vent and got very anxious and tachy.  RNs came in to soothe pt and reassure him.  Bolus of fentanyl given and 2mg  of Ativan given at this time.  Will continue to monitor pt.

## 2014-07-03 NOTE — Progress Notes (Signed)
NEURO HOSPITALIST PROGRESS NOTE   SUBJECTIVE:                                                                                                                        Intubated on the vent but in good spirits and able to follow commands. Undergoing PLEX 3/5 today. No new neurological developments.  OBJECTIVE:                                                                                                                           Vital signs in last 24 hours: Temp:  [96.6 F (35.9 C)-98.3 F (36.8 C)] 96.6 F (35.9 C) (11/23 1009) Pulse Rate:  [67-103] 75 (11/23 1030) Resp:  [9-33] 23 (11/23 1030) BP: (90-178)/(44-95) 157/61 mmHg (11/23 1030) SpO2:  [93 %-99 %] 99 % (11/23 1030) FiO2 (%):  [40 %] 40 % (11/23 0936) Weight:  [85.276 kg (188 lb)-85.6 kg (188 lb 11.4 oz)] 85.6 kg (188 lb 11.4 oz) (11/23 0600)  Intake/Output from previous day: 11/22 0701 - 11/23 0700 In: 1265 [I.V.:373.4; NG/GT:841.7; IV Piggyback:50] Out: 1250 [Urine:1250] Intake/Output this shift: Total I/O In: 60 [I.V.:60] Out: 40 [Urine:40] Nutritional status: Diet NPO time specified  Past Medical History  Diagnosis Date  . COLONIC POLYPS 02/01/2008  . DIABETES MELLITUS, TYPE II 05/20/2010  . HYPERLIPIDEMIA 10/09/2008  . ANXIETY DEPRESSION 02/01/2008  . ERECTILE DYSFUNCTION 10/09/2008  . ADD 10/09/2008  . SLEEP APNEA, OBSTRUCTIVE 02/01/2008  . MORTON'S NEUROMA 05/20/2010  . PERIPHERAL NEUROPATHY 05/20/2010  . Other specified forms of hearing loss 06/27/2009  . HYPERTENSION 10/09/2008  . HEMORRHOIDS 02/01/2008  . ALLERGIC RHINITIS 10/09/2008  . Stricture and stenosis of esophagus 02/02/2008  . GERD 02/01/2008  . HIATAL HERNIA 02/01/2008  . ERECTILE DYSFUNCTION, ORGANIC 05/20/2010  . WRIST PAIN, LEFT 12/05/2009  . FOOT PAIN, LEFT 05/20/2010  . PERIPHERAL EDEMA 05/20/2010  . DYSPNEA 03/12/2010  . Abdominal pain, unspecified site 01/19/2009  . Abdominal pain, left lower quadrant  06/06/2010  . Type II or unspecified type diabetes mellitus without mention of complication, uncontrolled 11/14/2010   Physical exam: pleasant male in no apparent distress. Head: normocephalic. Neck: supple, no bruits, no JVD. Cardiac: no murmurs. Lungs: clear. Abdomen: soft, no tender, no mass.  Extremities: no edema.  Neurologic Exam:  General: Mental Status: Intubated on the vent but alert and awake, follows commands. Cranial Nerves: II: Discs flat bilaterally; Visual fields grossly normal, pupils equal, round, reactive to light and accommodation III,IV, VI: ptosis not present, extra-ocular motions intact bilaterally V,VII: smile symmetric, facial light touch sensation normal bilaterally VIII: hearing normal bilaterally IX,X: gag reflex present XI: bilateral shoulder shrug no tested XII: tongue, intubated  Motor: 3/5 plantar/dorsi flexion, 4/5 hip flexion, 4/5 hand weakness, 4/5 weakness biceps/deltoids. Tone and bulk:normal tone throughout; no atrophy noted Sensory: Pinprick and light touch intact throughout, bilaterally Deep Tendon Reflexes:  Absent  Plantars: Right: downgoing   Left: downgoing Cerebellar: No tested Gait: Unable to test    Lab Results: Lab Results  Component Value Date/Time   CHOL 209* 05/04/2014 09:00 AM   Lipid Panel No results for input(s): CHOL, TRIG, HDL, CHOLHDL, VLDL, LDLCALC in the last 72 hours.  Studies/Results: Mr Cervical Spine Wo Contrast  07/02/2014   CLINICAL DATA:  Descending numbness and weakness, pain. Possible Guillain-Barre syndrome versus transverse myelitis.  EXAM: MRI CERVICAL AND THORACIC SPINE WITHOUT CONTRAST  TECHNIQUE: Multiplanar and multiecho pulse sequences of the cervical spine, to include the craniocervical junction and cervicothoracic junction, and thoracic spine, were obtained without intravenous contrast.  COMPARISON:  CT of the head and cervical spine June 30, 2014  FINDINGS: MRI CERVICAL SPINE FINDINGS   Cervical vertebral bodies and posterior elements appear intact. Straightened cervical lordosis. 2 mm grade 1 C7-T1 anterolisthesis. Mild C4-5, moderate C5-6 and mild C6-7 degenerative discs. Moderate subacute to chronic discogenic endplate changes O9-7 and C5-6, mild to moderate C6-7. Mild subacute discogenic endplate changes D5-3. No STIR signal abnormality to suggest acute osseous process.  Cervical spinal cord appears normal morphology and signal characteristics from the cervicomedullary junction to at least the level T1-2 Common the most caudal well visualized level. Craniocervical junction is intact. Life support lines in place (endotracheal and nasogastric tube), partially imaged RIGHT internal jugular central venous catheter partially characterized.  Level by level evaluation:  C1-2: 2 mm broad-based disc bulge asymmetric to the RIGHT. Uncal vertebral hypertrophy. Severe RIGHT and mild LEFT facet arthropathy. No canal stenosis. Moderate to severe RIGHT neural foraminal narrowing.  C3-4: Uncovertebral hypertrophy and mild facet arthropathy. Mild canal stenosis. Mild LEFT neural foraminal narrowing.  C4-5: 3 mm broad-based disc bulge, uncovertebral hypertrophy and mild facet arthropathy. Mild to moderate canal stenosis. Moderate to severe RIGHT, moderate LEFT neural foraminal narrowing.  C5-6: 2 mm broad-based disc bulge, uncovertebral hypertrophy and mild to moderate facet arthropathy. Moderate canal stenosis. Severe bilateral neural foraminal narrowing.  C6-7: 2 mm broad-based disc bulge, uncovertebral hypertrophy and mild facet arthropathy. Mild canal stenosis. Moderate to severe RIGHT, severe LEFT neural foraminal narrowing.  C7-T1: Anterolisthesis. Moderate facet arthropathy without canal stenosis or neural foraminal narrowing.  MRI THORACIC SPINE FINDINGS  Vertebral bodies and posterior elements are intact and aligned and maintenance of thoracic kyphosis. Intervertebral disc morphology is generally  preserved with decreased T2 signal consistent with mild desiccation, superimposed mineralization of this T6-7, and T8-9 discs. Mild chronic discogenic endplate changes and mid thoracic spine. No STIR signal abnormality to suggest acute osseous process.  Thoracic spinal cord appears normal morphology and signal characteristics level the conus medullaris which is partially imaged at L1. Included view of the chest demonstrates consolidated lung bases with small pleural effusions. Paraspinal soft tissues are demonstrated asymmetric RIGHT muscle atrophy versus denervation.  At T7-8 LEFT central, probably acute 3  mm disc protrusion with annular fissure deforms the LEFT ventral spinal cord. No canal stenosis. No neural foraminal narrowing at any thoracic level.  IMPRESSION: MRI CERVICAL SPINE: Normal noncontrast MRI of the cervical spinal cord, if clinical concern persists for cord pathology, contrast enhanced sequences would be more sensitive for acute changes.  Cervical spondylosis without acute fracture or malalignment.  Moderate canal stenosis C5-6, mild to moderate at C4-5. Neural foraminal narrowing C1-2 thru C6-7: Severe on the RIGHT at C1-2, bilaterally at C5-6 and on the LEFT at C6-7.  MRI SPINE: No abnormal thoracic spinal cord signal, if clinical concern persists for cord pathology, contrast enhanced sequences would be more sensitive for acute changes.  Thoracic spondylosis without acute fracture or malalignment.  LEFT central T7-8 acute appearing disc protrusion deforms the ventral spinal cord.  Dense consolidation in the lung bases most consistent with pneumonia.   Electronically Signed   By: Elon Alas   On: 07/02/2014 19:05   Mr Thoracic Spine Wo Contrast  07/02/2014   CLINICAL DATA:  Descending numbness and weakness, pain. Possible Guillain-Barre syndrome versus transverse myelitis.  EXAM: MRI CERVICAL AND THORACIC SPINE WITHOUT CONTRAST  TECHNIQUE: Multiplanar and multiecho pulse sequences of  the cervical spine, to include the craniocervical junction and cervicothoracic junction, and thoracic spine, were obtained without intravenous contrast.  COMPARISON:  CT of the head and cervical spine June 30, 2014  FINDINGS: MRI CERVICAL SPINE FINDINGS  Cervical vertebral bodies and posterior elements appear intact. Straightened cervical lordosis. 2 mm grade 1 C7-T1 anterolisthesis. Mild C4-5, moderate C5-6 and mild C6-7 degenerative discs. Moderate subacute to chronic discogenic endplate changes J0-9 and C5-6, mild to moderate C6-7. Mild subacute discogenic endplate changes T2-6. No STIR signal abnormality to suggest acute osseous process.  Cervical spinal cord appears normal morphology and signal characteristics from the cervicomedullary junction to at least the level T1-2 Common the most caudal well visualized level. Craniocervical junction is intact. Life support lines in place (endotracheal and nasogastric tube), partially imaged RIGHT internal jugular central venous catheter partially characterized.  Level by level evaluation:  C1-2: 2 mm broad-based disc bulge asymmetric to the RIGHT. Uncal vertebral hypertrophy. Severe RIGHT and mild LEFT facet arthropathy. No canal stenosis. Moderate to severe RIGHT neural foraminal narrowing.  C3-4: Uncovertebral hypertrophy and mild facet arthropathy. Mild canal stenosis. Mild LEFT neural foraminal narrowing.  C4-5: 3 mm broad-based disc bulge, uncovertebral hypertrophy and mild facet arthropathy. Mild to moderate canal stenosis. Moderate to severe RIGHT, moderate LEFT neural foraminal narrowing.  C5-6: 2 mm broad-based disc bulge, uncovertebral hypertrophy and mild to moderate facet arthropathy. Moderate canal stenosis. Severe bilateral neural foraminal narrowing.  C6-7: 2 mm broad-based disc bulge, uncovertebral hypertrophy and mild facet arthropathy. Mild canal stenosis. Moderate to severe RIGHT, severe LEFT neural foraminal narrowing.  C7-T1: Anterolisthesis.  Moderate facet arthropathy without canal stenosis or neural foraminal narrowing.  MRI THORACIC SPINE FINDINGS  Vertebral bodies and posterior elements are intact and aligned and maintenance of thoracic kyphosis. Intervertebral disc morphology is generally preserved with decreased T2 signal consistent with mild desiccation, superimposed mineralization of this T6-7, and T8-9 discs. Mild chronic discogenic endplate changes and mid thoracic spine. No STIR signal abnormality to suggest acute osseous process.  Thoracic spinal cord appears normal morphology and signal characteristics level the conus medullaris which is partially imaged at L1. Included view of the chest demonstrates consolidated lung bases with small pleural effusions. Paraspinal soft tissues are demonstrated asymmetric RIGHT muscle atrophy versus denervation.  At T7-8  LEFT central, probably acute 3 mm disc protrusion with annular fissure deforms the LEFT ventral spinal cord. No canal stenosis. No neural foraminal narrowing at any thoracic level.  IMPRESSION: MRI CERVICAL SPINE: Normal noncontrast MRI of the cervical spinal cord, if clinical concern persists for cord pathology, contrast enhanced sequences would be more sensitive for acute changes.  Cervical spondylosis without acute fracture or malalignment.  Moderate canal stenosis C5-6, mild to moderate at C4-5. Neural foraminal narrowing C1-2 thru C6-7: Severe on the RIGHT at C1-2, bilaterally at C5-6 and on the LEFT at C6-7.  MRI SPINE: No abnormal thoracic spinal cord signal, if clinical concern persists for cord pathology, contrast enhanced sequences would be more sensitive for acute changes.  Thoracic spondylosis without acute fracture or malalignment.  LEFT central T7-8 acute appearing disc protrusion deforms the ventral spinal cord.  Dense consolidation in the lung bases most consistent with pneumonia.   Electronically Signed   By: Elon Alas   On: 07/02/2014 19:05   Dg Chest Port 1  View  07/03/2014   CLINICAL DATA:  Intubation.  EXAM: PORTABLE CHEST - 1 VIEW  COMPARISON:  05/02/2014.  FINDINGS: Endotracheal tube, NG tube, right IJ line in stable position. Bibasilar atelectasis and/or infiltrates noted. No pleural effusion or pneumothorax. Heart size is stable. No acute osseous abnormality.  IMPRESSION: 1. Lines and tubes in stable position. 2. Bibasilar subsegment atelectasis and/or infiltrates again noted.   Electronically Signed   By: Marcello Moores  Register   On: 07/03/2014 07:49   Dg Chest Port 1 View  07/02/2014   CLINICAL DATA:  Intubation  EXAM: PORTABLE CHEST - 1 VIEW  COMPARISON:  Portable exam 0525 hr compared to 07/01/2014  FINDINGS: Tip of endotracheal tube projects approximately 4.3 cm above carina.  Nasogastric tube extends into stomach.  RIGHT jugular central venous catheter tip projects mid SVC.  Normal heart size, mediastinal contours and pulmonary vascularity.  Bibasilar atelectasis.  No definite infiltrate, pleural effusion or pneumothorax.  IMPRESSION: Bibasilar atelectasis.   Electronically Signed   By: Lavonia Dana M.D.   On: 07/02/2014 07:33    MEDICATIONS                                                                                                                        Scheduled: . therapeutic plasma exchange solution   Dialysis Q1 Hr x 3  . antiseptic oral rinse  7 mL Mouth Rinse QID  . calcium carbonate  2 tablet Oral Q3H  . calcium gluconate IVPB  4 g Intravenous Once  . chlorhexidine  15 mL Mouth/Throat BID  . enoxaparin (LOVENOX) injection  40 mg Subcutaneous Q24H  . famotidine (PEPCID) IV  20 mg Intravenous Q24H  . feeding supplement (PRO-STAT SUGAR FREE 64)  30 mL Per Tube TID  . fentaNYL  100 mcg Intravenous Once  . free water  200 mL Per Tube 3 times per day  . heparin  1,000 Units Intracatheter Once    ASSESSMENT/PLAN:  58 yo M with  ascending numbness/weakness and pain, areflexia, and albuminocytologic dissociation. 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 Continue PLEX 5 days. Will continue to follow.  Dorian Pod, MD [facility] [phone]  07/03/2014, 10:43 AM

## 2014-07-04 ENCOUNTER — Inpatient Hospital Stay (HOSPITAL_COMMUNITY): Payer: BC Managed Care – PPO

## 2014-07-04 ENCOUNTER — Encounter (HOSPITAL_COMMUNITY): Payer: Self-pay

## 2014-07-04 LAB — GLUCOSE, CAPILLARY
GLUCOSE-CAPILLARY: 139 mg/dL — AB (ref 70–99)
GLUCOSE-CAPILLARY: 114 mg/dL — AB (ref 70–99)
Glucose-Capillary: 141 mg/dL — ABNORMAL HIGH (ref 70–99)
Glucose-Capillary: 143 mg/dL — ABNORMAL HIGH (ref 70–99)
Glucose-Capillary: 119 mg/dL — ABNORMAL HIGH (ref 70–99)

## 2014-07-04 LAB — CBC WITH DIFFERENTIAL/PLATELET
Basophils Absolute: 0 10*3/uL (ref 0.0–0.1)
Basophils Relative: 0 % (ref 0–1)
EOS ABS: 0.1 10*3/uL (ref 0.0–0.7)
EOS PCT: 1 % (ref 0–5)
HEMATOCRIT: 32.5 % — AB (ref 39.0–52.0)
Hemoglobin: 11.1 g/dL — ABNORMAL LOW (ref 13.0–17.0)
LYMPHS ABS: 2.5 10*3/uL (ref 0.7–4.0)
LYMPHS PCT: 23 % (ref 12–46)
MCH: 31.2 pg (ref 26.0–34.0)
MCHC: 34.2 g/dL (ref 30.0–36.0)
MCV: 91.3 fL (ref 78.0–100.0)
Monocytes Absolute: 0.8 10*3/uL (ref 0.1–1.0)
Monocytes Relative: 7 % (ref 3–12)
Neutro Abs: 7.5 10*3/uL (ref 1.7–7.7)
Neutrophils Relative %: 69 % (ref 43–77)
Platelets: 199 10*3/uL (ref 150–400)
RBC: 3.56 MIL/uL — AB (ref 4.22–5.81)
RDW: 13.3 % (ref 11.5–15.5)
WBC: 10.9 10*3/uL — AB (ref 4.0–10.5)

## 2014-07-04 LAB — COMPREHENSIVE METABOLIC PANEL
ALBUMIN: 3.6 g/dL (ref 3.5–5.2)
ALT: 45 U/L (ref 0–53)
AST: 31 U/L (ref 0–37)
Alkaline Phosphatase: 51 U/L (ref 39–117)
Anion gap: 11 (ref 5–15)
BUN: 28 mg/dL — ABNORMAL HIGH (ref 6–23)
CHLORIDE: 107 meq/L (ref 96–112)
CO2: 27 mEq/L (ref 19–32)
CREATININE: 0.73 mg/dL (ref 0.50–1.35)
Calcium: 9 mg/dL (ref 8.4–10.5)
GFR calc Af Amer: >90 mL/min (ref >90)
GFR calc non Af Amer: >90 mL/min (ref >90)
Glucose, Bld: 145 mg/dL — ABNORMAL HIGH (ref 70–99)
Potassium: 3.6 mEq/L — ABNORMAL LOW (ref 3.7–5.3)
SODIUM: 145 meq/L (ref 137–147)
Total Bilirubin: 0.5 mg/dL (ref 0.3–1.2)
Total Protein: 5.5 g/dL — ABNORMAL LOW (ref 6.0–8.3)

## 2014-07-04 LAB — HERPES SIMPLEX VIRUS(HSV) DNA BY PCR
HSV 1 DNA: NOT DETECTED
HSV 2 DNA: NOT DETECTED

## 2014-07-04 LAB — WEST NILE AB, IGG AND IGM, CSF

## 2014-07-04 LAB — ACETYLCHOLINE RECEPTOR, BINDING: Acetylcholine Receptor Ab: <0.3 nmol/L (ref <=0.30)

## 2014-07-04 MED ORDER — LORAZEPAM 2 MG/ML IJ SOLN
1.0000 mg | INTRAMUSCULAR | Status: DC | PRN
  Administered 2014-07-04 – 2014-07-11 (×24): 1 mg via INTRAVENOUS
  Filled 2014-07-04 (×26): qty 1

## 2014-07-04 NOTE — Progress Notes (Signed)
NUTRITION FOLLOW-UP  Pt meets criteria for SEVERE MALNUTRITION in the context of acute illness as evidenced by 7% weight loss < 1 month and intake of </= 50% of his needs for >/= 5 days.  DOCUMENTATION CODES Per approved criteria  -Severe malnutrition in the context of acute illness or injury   INTERVENTION: Vital AF 1.2 @ 50 ml/hr via feeding tube   30 ml Prostat TID.    Tube feeding regimen provides 1740 kcal (97% of needs), 135 grams of protein, and 973 ml of H2O.   NUTRITION DIAGNOSIS: Inadequate oral intake related to inability to eat as evidenced by NPO status; ongoing.   Goal: Pt to meet >/= 90% of their estimated nutrition needs, met.   Monitor:  TF tolerance, weight trends, labs   ASSESSMENT: Pt admitted this am with progressive weakness, now unable to walk. 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  Pt intubated this am for airway protection and HD catheter inserted to begin IVIG.   Patient is currently intubated on ventilator support MV: 10.6 L/min Temp (24hrs), Avg:98.3 F (36.8 C), Min:97.7 F (36.5 C), Max:98.9 F (37.2 C)  Propofol: none Pt has completed plasma exchange treatments, no BM yet (4 days). Medications added (colace, miralax) When entered pt's room Vital AF 1.2 infusing @ 45 ml/hr, RN unaware why TF not infusing at goal rate. RN adjusted.  Pt awake and alert on vent with sitter at bedside. Pt nods his head no to feeling hungry.  Potassium low.  Height: Ht Readings from Last 1 Encounters:  06/30/14 '5\' 9"'  (1.753 m)    Weight: Wt Readings from Last 1 Encounters:  07/04/14 193 lb 5.5 oz (87.7 kg)  Admission weight: 185 lb (84 kg) 11/20   BMI:  Body mass index is 28.54 kg/(m^2).  Estimated Nutritional Needs: Kcal: 1789 Protein: >/= 126 grams Fluid: > 1.8 L/day  Skin: WDL  Diet Order: Diet NPO time specified   Intake/Output Summary (Last 24 hours) at 07/04/14 1035 Last data filed at 07/04/14 1000  Gross per 24 hour   Intake   1693 ml  Output   1060 ml  Net    633 ml    Last BM: PTA   Labs:   Recent Labs Lab 07/01/14 0330  07/02/14 0244 07/03/14 0530 07/03/14 0909 07/04/14 0520  NA 145  < > 145 145 143 145  K 3.4*  < > 4.1 3.6* 3.5* 3.6*  CL 106  < > 107 104 102 107  CO2 26  --  25 29  --  27  BUN 33*  < > 30* 33* 29* 28*  CREATININE 0.89  < > 0.74 0.81 0.80 0.73  CALCIUM 9.3  --  9.4 9.2  --  9.0  MG 2.1  --  2.0 2.4  --   --   PHOS 3.8  --  3.1 2.6  --   --   GLUCOSE 147*  < > 152* 136* 137* 145*  < > = values in this interval not displayed.  CBG (last 3)   Recent Labs  07/04/14 0014 07/04/14 0500 07/04/14 0830  GLUCAP 141* 139* 114*    Scheduled Meds: . antiseptic oral rinse  7 mL Mouth Rinse QID  . chlorhexidine  15 mL Mouth/Throat BID  . docusate  100 mg Oral BID  . enoxaparin (LOVENOX) injection  40 mg Subcutaneous Q24H  . famotidine (PEPCID) IV  20 mg Intravenous Q24H  . feeding supplement (PRO-STAT SUGAR FREE 64)  30 mL Per Tube TID  . fentaNYL  100 mcg Intravenous Once  . heparin  1,000 Units Intracatheter Once  . polyethylene glycol  17 g Oral Daily    Continuous Infusions: . citrate dextrose    . feeding supplement (VITAL AF 1.2 CAL) 1,000 mL (07/04/14 1000)  . fentaNYL infusion INTRAVENOUS 100 mcg/hr (07/04/14 1000)   [place], [place], CNSC [phone] Pager [phone] After Hours Pager

## 2014-07-04 NOTE — Progress Notes (Signed)
PULMONARY / CRITICAL CARE MEDICINE   Name: Frank [name] MRN: [id number] DOB: 01-14-1956    ADMISSION DATE:  06/30/2014 CONSULTATION DATE:  11/20  REFERRING MD :  Neuro  CHIEF COMPLAINT:  Can't walk  INITIAL PRESENTATION:  58 yo WM with progressive ascending neuromuscular weakness over several months who presents with inability to walk, dysarthria, profound weakness and impending respiratory failure. 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  Neurology has requested PCCM to placed HD catheter for plasma exchange and he needs intubation for airway protection.   STUDIES:  11/20 Head CT >> no acute intracranial abnormalities, degenerative changes throughout cervical spine  SIGNIFICANT EVENTS: 11/11 - 11/18:  Admitted for N, V, HCAP, Dysphagia, Chronic back pain, Urinary retention and generalized weakness 11/20:  Intubated for progressive weakness/ascending paralysis and plasmapheresis started.  SUBJECTIVE: Patient disconntected from vent and then got very anxious and tachycardic. Severe anxiety this a.m. around spontaneous breathing trial Afebrile NIF 35   VITAL SIGNS: Temp:  [97.9 F (36.6 C)-98.2 F (36.8 C)] 97.9 F (36.6 C) (11/23 0000) Pulse Rate:  [76-107] 83 (11/23 0700) Resp:  [9-33] 19 (11/23 0700) BP: (90-178)/(44-95) 150/95 mmHg (11/23 0700) SpO2:  [93 %-99 %] 97 % (11/23 0700) FiO2 (%):  [40 %] 40 % (11/23 0343) Weight:  [188 lb (85.276 kg)-188 lb 11.4 oz (85.6 kg)] 188 lb 11.4 oz (85.6 kg) (11/23 0600) HEMODYNAMICS:   VENTILATOR SETTINGS: Vent Mode:  [-] PRVC FiO2 (%):  [40 %] 40 % Set Rate:  [16 bmp] 16 bmp Vt Set:  [570 mL] 570 mL PEEP:  [5 cmH20] 5 cmH20 Pressure Support:  [5 cmH20-8 cmH20] 8 cmH20 Plateau Pressure:  [21 cmH20-22 cmH20] 21 cmH20 INTAKE / OUTPUT:  Intake/Output Summary (Last 24 hours) at 07/03/14 0734 Last data filed at 07/03/14 0600  Gross per 24 hour  Intake 1173.02 ml  Output   1250 ml  Net -76.98 ml    PHYSICAL EXAMINATION: General: on vent alert,  anxious Neuro:  rass 0 awake, cooperative, very alert, power 4 over 5 all 4 extremities HEENT:  No JVD /LAN Cardiovascular:  HSR RRR Lungs: CTA Abdomen:  +bs, no r/g Musculoskeletal:  General weakness  Skin:  warm  LABS:  CBC  Recent Labs Lab 07/01/14 0330 07/01/14 1129 07/02/14 0244 07/03/14 0530  WBC 9.5  --  14.2* 13.6*  HGB 12.7* 13.6 13.2 11.2*  HCT 37.5* 40.0 38.7* 32.9*  PLT 213  --  195 196   Coag's No results for input(s): APTT, INR in the last 168 hours. BMET  Recent Labs Lab 07/01/14 0330 07/01/14 1129 07/02/14 0244 07/03/14 0530  NA 145 145 145 145  K 3.4* 3.7 4.1 3.6*  CL 106 104 107 104  CO2 26  --  25 29  BUN 33* 30* 30* 33*  CREATININE 0.89 0.90 0.74 0.81  GLUCOSE 147* 141* 152* 136*   Electrolytes  Recent Labs Lab 07/01/14 0330 07/02/14 0244 07/03/14 0530  CALCIUM 9.3 9.4 9.2  MG 2.1 2.0 2.4  PHOS 3.8 3.1 2.6   Sepsis Markers No results for input(s): LATICACIDVEN, PROCALCITON, O2SATVEN in the last 168 hours. ABG  Recent Labs Lab 07/01/14 0245 07/02/14 0404 07/03/14 0348  PHART 7.439 7.434 7.441  PCO2ART 38.5 40.2 44.5  PO2ART 93.0 68.2* 68.9*   Liver Enzymes No results for input(s): AST, ALT, ALKPHOS, BILITOT, ALBUMIN in the last 168 hours. Cardiac Enzymes No results for input(s): TROPONINI, PROBNP in the last 168 hours. Glucose  Recent Labs Lab 07/02/14 0435 07/02/14 0744 07/02/14 1139  07/02/14 2006 07/03/14 0004 07/03/14 0405  GLUCAP 146* 171* 143* 144* 155* 153*    Imaging 11/24 bibasal airspace disease  ASSESSMENT / PLAN:  NEUROLOGIC A:   Progressive paralysis -  GBS  (per neuro)-improving with plasma exchange,NIf improving P:   RASS goal: 0 Plasma exchange - 4/5 completed Sedation with fentanyl drip Neuro consulting Ativan when necessary anxiety   PULMONARY OETT 1/20>> A: VDRF due to ascending paralysis (see neuro) P:   SBTsdaily with goal extubation soon Nif/ vc assessment  daily  CARDIOVASCULAR CVL 1/20 rt IJ HD cath>> At risk autonomic  dysfxn A:  Hx of htn P:  Hold antihypertensives given fluctuant SBP. Labetolol ordererd PRN Monitor BP.  RENAL A:   No acute issue P:   BMET in AM   GASTROINTESTINAL A:  No issues P:   TF per nutrition Evaluate for BM,  May need addition colace, dulcolax, miralax  HEMATOLOGIC A:   No acute issues P:  Heparin SQ for DVT prophylaxis  CBC in am  Transfuse per ICU protocol  INFECTIOUS A:   Leukocytosis P:   Monitor Abx and fever curve.  ENDOCRINE A:   Hyperglycemia  P:   SSI  Family-updated patient and partner at bedside  Summary: for plasmaxchange 5/5 , hope to extubate soon now that weakness has improved  The patient is critically ill with multiple organ systems failure and requires high complexity decision making for assessment and support, frequent evaluation and titration of therapies, application of advanced monitoring technologies and extensive interpretation of multiple databases. Critical Care Time devoted to patient care services described in this note is 35 minutes.   Kara Mead MD. [place] Flood. [facility] Pager [phone] If no response call [phone]

## 2014-07-04 NOTE — Progress Notes (Signed)
NIF -46 FVC 748 ML

## 2014-07-05 ENCOUNTER — Inpatient Hospital Stay (HOSPITAL_COMMUNITY): Payer: BC Managed Care – PPO

## 2014-07-05 DIAGNOSIS — J9600 Acute respiratory failure, unspecified whether with hypoxia or hypercapnia: Secondary | ICD-10-CM | POA: Diagnosis present

## 2014-07-05 DIAGNOSIS — R531 Weakness: Secondary | ICD-10-CM

## 2014-07-05 DIAGNOSIS — Z4659 Encounter for fitting and adjustment of other gastrointestinal appliance and device: Secondary | ICD-10-CM | POA: Diagnosis not present

## 2014-07-05 LAB — GLUCOSE, CAPILLARY
GLUCOSE-CAPILLARY: 127 mg/dL — AB (ref 70–99)
GLUCOSE-CAPILLARY: 156 mg/dL — AB (ref 70–99)
Glucose-Capillary: 116 mg/dL — ABNORMAL HIGH (ref 70–99)
Glucose-Capillary: 137 mg/dL — ABNORMAL HIGH (ref 70–99)
Glucose-Capillary: 156 mg/dL — ABNORMAL HIGH (ref 70–99)
Glucose-Capillary: 139 mg/dL — ABNORMAL HIGH (ref 70–99)
Glucose-Capillary: 106 mg/dL — ABNORMAL HIGH (ref 70–99)

## 2014-07-05 LAB — BASIC METABOLIC PANEL
Anion gap: 10 (ref 5–15)
BUN: 23 mg/dL (ref 6–23)
CALCIUM: 9 mg/dL (ref 8.4–10.5)
CO2: 30 meq/L (ref 19–32)
CREATININE: 0.67 mg/dL (ref 0.50–1.35)
Chloride: 100 mEq/L (ref 96–112)
GFR calc Af Amer: >90 mL/min (ref >90)
GFR calc non Af Amer: >90 mL/min (ref >90)
Glucose, Bld: 116 mg/dL — ABNORMAL HIGH (ref 70–99)
Potassium: 3.5 mEq/L — ABNORMAL LOW (ref 3.7–5.3)
Sodium: 140 mEq/L (ref 137–147)

## 2014-07-05 LAB — CBC
HEMATOCRIT: 30.7 % — AB (ref 39.0–52.0)
Hemoglobin: 10.4 g/dL — ABNORMAL LOW (ref 13.0–17.0)
MCH: 30.1 pg (ref 26.0–34.0)
MCHC: 33.9 g/dL (ref 30.0–36.0)
MCV: 88.7 fL (ref 78.0–100.0)
Platelets: 197 10*3/uL (ref 150–400)
RBC: 3.46 MIL/uL — ABNORMAL LOW (ref 4.22–5.81)
RDW: 13 % (ref 11.5–15.5)
WBC: 9.1 10*3/uL (ref 4.0–10.5)

## 2014-07-05 MED ORDER — SODIUM CHLORIDE 0.9 % IV SOLN
Freq: Once | INTRAVENOUS | Status: DC
  Filled 2014-07-05: qty 200

## 2014-07-05 MED ORDER — SODIUM CHLORIDE 0.9 % IV SOLN
INTRAVENOUS | Status: AC
  Administered 2014-07-05 (×3): via INTRAVENOUS_CENTRAL
  Filled 2014-07-05 (×3): qty 200

## 2014-07-05 MED ORDER — FUROSEMIDE 10 MG/ML IJ SOLN
40.0000 mg | Freq: Two times a day (BID) | INTRAMUSCULAR | Status: AC
  Administered 2014-07-05 – 2014-07-06 (×3): 40 mg via INTRAVENOUS
  Filled 2014-07-05 (×5): qty 4

## 2014-07-05 MED ORDER — ACETAMINOPHEN 325 MG PO TABS: 650.0000 mg | ORAL_TABLET | ORAL | Status: DC | PRN

## 2014-07-05 MED ORDER — ACD FORMULA A 0.73-2.45-2.2 GM/100ML VI SOLN
500.0000 mL | Status: DC
  Filled 2014-07-05: qty 500

## 2014-07-05 MED ORDER — ANTICOAGULANT SODIUM CITRATE 4% (200MG/5ML) IV SOLN
5.0000 mL | Freq: Once | Status: AC
  Administered 2014-07-05: 5 mL
  Filled 2014-07-05 (×2): qty 250

## 2014-07-05 MED ORDER — ACD FORMULA A 0.73-2.45-2.2 GM/100ML VI SOLN
Status: AC
  Filled 2014-07-05: qty 1000

## 2014-07-05 MED ORDER — SODIUM CHLORIDE 0.9 % IV SOLN
4.0000 g | Freq: Once | INTRAVENOUS | Status: AC
  Administered 2014-07-05: 4 g via INTRAVENOUS
  Filled 2014-07-05 (×2): qty 40

## 2014-07-05 MED ORDER — DIPHENHYDRAMINE HCL 25 MG PO CAPS: 25.0000 mg | ORAL_CAPSULE | Freq: Four times a day (QID) | ORAL | Status: DC | PRN

## 2014-07-05 NOTE — Progress Notes (Signed)
Rehab Admissions Coordinator Note:  Patient was screened by Retta Diones for appropriateness for an Inpatient Acute Rehab Consult.  At this time, we are recommending Inpatient Rehab consult.  Retta Diones 07/05/2014, 2:38 PM  I can be reached at [phone].

## 2014-07-05 NOTE — Progress Notes (Signed)
TPE completed with no problems. Pt alert, no c/o. Moving extremities w/ no difficulty.  Final values recorded.

## 2014-07-05 NOTE — Progress Notes (Signed)
NEURO HOSPITALIST PROGRESS NOTE   SUBJECTIVE:                                                                                                                        Patient was very drowsy during my exam. He had just received IV Benadryl due to agitation and itching. Able to follow some commands and responds to question but falls asleep easily during the exam.  Has received PLEX 4/5 as of yesterday. No new neurological developments besides ongoing agitation.  OBJECTIVE:                                                                                                                           Vital signs in last 24 hours: Temp:  [97.9 F (36.6 C)-98.6 F (37 C)] 98.6 F (37 C) (11/25 0400) Pulse Rate:  [63-92] 68 (11/25 0600) Resp:  [9-28] 13 (11/25 0600) BP: (109-187)/(53-136) 116/57 mmHg (11/25 0600) SpO2:  [94 %-100 %] 98 % (11/25 0600) FiO2 (%):  [40 %] 40 % (11/25 0600) Weight:  [189 lb 6 oz (85.9 kg)] 189 lb 6 oz (85.9 kg) (11/25 0500)  Intake/Output from previous day: 11/24 0701 - 11/25 0700 In: 1545.2 [I.V.:405.2; NG/GT:1140] Out: 990 [Urine:990] Intake/Output this shift:   Nutritional status: Diet NPO time specified  Past Medical History  Diagnosis Date  . COLONIC POLYPS 02/01/2008  . DIABETES MELLITUS, TYPE II 05/20/2010  . HYPERLIPIDEMIA 10/09/2008  . ANXIETY DEPRESSION 02/01/2008  . ERECTILE DYSFUNCTION 10/09/2008  . ADD 10/09/2008  . SLEEP APNEA, OBSTRUCTIVE 02/01/2008  . MORTON'S NEUROMA 05/20/2010  . PERIPHERAL NEUROPATHY 05/20/2010  . Other specified forms of hearing loss 06/27/2009  . HYPERTENSION 10/09/2008  . HEMORRHOIDS 02/01/2008  . ALLERGIC RHINITIS 10/09/2008  . Stricture and stenosis of esophagus 02/02/2008  . GERD 02/01/2008  . HIATAL HERNIA 02/01/2008  . ERECTILE DYSFUNCTION, ORGANIC 05/20/2010  . WRIST PAIN, LEFT 12/05/2009  . FOOT PAIN, LEFT 05/20/2010  . PERIPHERAL EDEMA 05/20/2010  . DYSPNEA 03/12/2010  . Abdominal  pain, unspecified site 01/19/2009  . Abdominal pain, left lower quadrant 06/06/2010  . Type II or unspecified type diabetes mellitus without mention of complication, uncontrolled 11/14/2010   Physical exam:  Drowsy but follows commands intermittently. no apparent  distress. Sitter at bedside Head: normocephalic. Neck: supple, no bruits, no JVD. Cardiac: no murmurs. Lungs: clear. Abdomen: soft, no tender, no mass. Bowel sounds are present. Extremities: no edema.  Neurologic Exam:  General: Mental Status: Intubated on the vent but alert and awake, follows commands though drowsy this am after benadryl. Cranial Nerves: II: Visual fields grossly normal, pupils equal, round, reactive to light and accommodation III,IV, VI: ptosis not present, extra-ocular motions intact bilaterally V,VII: smile symmetric, facial light touch sensation normal bilaterally VIII: hearing normal bilaterally XII: tongue, intubated  Motor: 3/5 plantar/dorsi flexion, 4/5 hip flexion, 4/5 hand weakness, 4/5 weakness biceps/deltoids. Tone and bulk:normal tone throughout; no atrophy noted Sensory:  light touch intact throughout, bilaterally Deep Tendon Reflexes:  Absent  Plantars: Right: absent   Left: absent Cerebellar: No tested Gait: Unable to test    Lab Results: Lab Results  Component Value Date/Time   CHOL 209* 05/04/2014 09:00 AM   Lipid Panel No results for input(s): CHOL, TRIG, HDL, CHOLHDL, VLDL, LDLCALC in the last 72 hours.  Studies/Results: Dg Chest Port 1 View  07/05/2014   CLINICAL DATA:  Acute respiratory failure ; intubated patient.  EXAM: PORTABLE CHEST - 1 VIEW  COMPARISON:  Portable chest x-ray of July 04, 2014.  FINDINGS: The lungs are adequately inflated with decreased bibasilar atelectasis. The heart and pulmonary vascularity are normal. There is no pleural effusion or pneumothorax. The endotracheal tube tip projects 6.3 cm above the crotch of the carina. The esophagogastric tube  tip projects in the gastric cardia but the proximal port lies at or above the GE junction. Advancement by 5 to 10 cm is recommended. The right internal jugular Cordis sheath tip projects over the proximal SVC.  IMPRESSION: Improved appearance of both bases consistent with resolving atelectasis. There is no evidence of pulmonary edema. The support tubes lines are in position as described. Advancement of the esophagogastric tube is recommended.   Electronically Signed   By: David  Martinique   On: 07/05/2014 07:04   Dg Chest Port 1 View  07/04/2014   CLINICAL DATA:  Acute respiratory acidosis.  EXAM: PORTABLE CHEST - 1 VIEW  COMPARISON:  07/03/2014.  FINDINGS: Endotracheal tube terminates approximately 5.7 cm above the carina. Nasogastric tube is followed into the stomach. Right IJ central line tip projects over the SVC.  Heart size is stable. Lungs are somewhat low in volume with bibasilar airspace disease, right greater than left. Biapical pleural thickening. No pleural fluid. No pneumothorax.  IMPRESSION: Bibasilar airspace disease, right greater than left, possibly due to atelectasis. Difficult to exclude pneumonia.   Electronically Signed   By: Lorin Picket M.D.   On: 07/04/2014 07:38   Dg Abd Portable 1v  07/04/2014   CLINICAL DATA:  Orogastric tube placement  EXAM: PORTABLE ABDOMEN - 1 VIEW  COMPARISON:  None.  FINDINGS: Orogastric tube with the tip projecting over the fundus of the stomach. There is no bowel dilatation to suggest obstruction. There is no evidence of pneumoperitoneum, portal venous gas or pneumatosis. There are no pathologic calcifications along the expected course of the ureters.The osseous structures are unremarkable.  IMPRESSION: Orogastric tube with the tip projecting over the fundus of the stomach.   Electronically Signed   By: Kathreen Devoid   On: 07/04/2014 21:35    MEDICATIONS  Scheduled: . therapeutic plasma exchange solution   Dialysis Q1 Hr x 3  . anticoagulant sodium citrate  5 mL Intracatheter Once  . antiseptic oral rinse  7 mL Mouth Rinse QID  . calcium gluconate IVPB  4 g Intravenous Once  . chlorhexidine  15 mL Mouth/Throat BID  . citrate dextrose      . docusate  100 mg Oral BID  . enoxaparin (LOVENOX) injection  40 mg Subcutaneous Q24H  . famotidine (PEPCID) IV  20 mg Intravenous Q24H  . feeding supplement (PRO-STAT SUGAR FREE 64)  30 mL Per Tube TID  . fentaNYL  100 mcg Intravenous Once  . heparin  1,000 Units Intracatheter Once  . polyethylene glycol  17 g Oral Daily    ASSESSMENT/PLAN:                                                                                                           58 yo M with ascending numbness/weakness and pain, areflexia, and albuminocytologic dissociation. Overall neurological syndrome and CSF finding consistent with guillian-barre syndrome. He is making some improvement. NIF -48 per yesterday's note. Appears drowsy this morning after he received Benadryl.  Continue PLEX 5 days. Has received 4/5. Another session planned for today. Will continue to follow.   Case discussed with Dr Armida Sans.  Jessee Avers, MD PGY-3 Internal Medicine Teaching Service Pager: [phone] 07/05/2014, 8:29 AM    Patient seen and examined together with PGY-3 Internal Medicine resident Dr Alice Rieger and I concur with the assessment and plan.  Dorian Pod, MD

## 2014-07-05 NOTE — Procedures (Signed)
Extubation Procedure Note  Patient Details:   Name: Frank [name] DOB: [birth year] MRN: [id number]   Airway Documentation:  Airway 7.6 mm (Active)  Secured at (cm) 24 cm 07/05/2014  9:19 AM  Measured From Lips 07/05/2014  9:19 AM  [place] 07/05/2014  9:19 AM  Secured By [employer] 07/05/2014  9:19 AM  Tube Holder Repositioned Yes 07/05/2014  9:19 AM  Cuff Pressure (cm H2O) 24 cm H2O 07/04/2014  3:37 AM  Site Condition Dry 07/05/2014  9:19 AM    Evaluation  O2 sats: stable throughout Complications: No apparent complications Patient did tolerate procedure well. Bilateral Breath Sounds: Clear, Diminished Suctioning: Oral Yes  Pt was extubated to 3 LPM nasal cannula. Pt had positive cuff leak. Pt and good strong productive cough. Was able to speak name and no stridor was noted. Vitals stable throughout. BBS clear with some rhonchi in upper lobes. RT will continue to monitor.   Daoud Lobue M 07/05/2014, 12:08 PM

## 2014-07-05 NOTE — Evaluation (Signed)
Physical Therapy Evaluation Patient Details Name: Frank [name] MRN: [id number] DOB: 1956-01-11 Today's Date: 07/05/2014   History of Present Illness  pt presents with GBS.  pt has been treated with Plasma Exchange and was intubated 11/20- 11/25.    Clinical Impression  Pt globally weak and with decreased coordination and dyskinetic movement in extremities and trunk.  Pt seems anxious and has cognitive deficits, but no family present to determine pt's baseline.  Pt at this time would benefit from CIR to maximize independence prior to returning to home.  Will continue to follow.      Follow Up Recommendations CIR    Equipment Recommendations   (TBD)    Recommendations for Other Services Rehab consult     Precautions / Restrictions Precautions Precautions: Fall Precaution Comments: Chronic back pain per previous admit in [place].   Restrictions Weight Bearing Restrictions: No      Mobility  Bed Mobility Overal bed mobility: Needs Assistance;+2 for physical assistance Bed Mobility: Supine to Sit     Supine to sit: Mod assist;+2 for physical assistance;HOB elevated     General bed mobility comments: pt able to initiate movement in LEs, but requires A for directing movement and for bringing trunk up to sitting.    Transfers Overall transfer level: Needs assistance Equipment used: 2 person hand held assist Transfers: Sit to/from [facility] Sit to Stand: Max assist;+2 physical assistance Stand pivot transfers: Total assist;+2 physical assistance       General transfer comment: pt able to come to stand, but seems to get anxious once standing.  Repeated coming to stand x2.  pt needs facilitation for positioning of LEs and blocking feet prior to coming to stand and then increased A needed to complete pivot to recliner.    Ambulation/Gait                Stairs            Wheelchair Mobility    Modified Rankin (Stroke Patients Only)        Balance Overall balance assessment: Needs assistance;History of Falls Sitting-balance support: Bilateral upper extremity supported;Feet supported Sitting balance-Leahy Scale: Poor Sitting balance - Comments: pt fluctuates between [place] ModA to maintain balance at EOB.  pt with uncoordinated, dyskinetic movements at trunk.                                       Pertinent Vitals/Pain Pain Assessment: No/denies pain    Home Living Family/patient expects to be discharged to:: Inpatient rehab                      Prior Function           Comments: Unclear true baseline as pt has been having progressive weakness and increasing frequency of falls per chart.       Hand Dominance        Extremity/Trunk Assessment   Upper Extremity Assessment: Defer to OT evaluation           Lower Extremity Assessment: RLE deficits/detail;LLE deficits/detail RLE Deficits / Details: Strength grossly 3/5 with dyskinetic type movements.   LLE Deficits / Details: Strength  grossly 3/5 with dyskinetic type movements.    Cervical / Trunk Assessment: Other exceptions  Communication   Communication: No difficulties  Cognition Arousal/Alertness: Awake/alert Behavior During Therapy: Flat affect Overall Cognitive Status: Impaired/Different from baseline Area of  Impairment: Orientation;Attention;Memory;Following commands;Safety/judgement;Awareness;Problem solving Orientation Level: Disoriented to;Situation (pt appropriately found calendar in room for day and date.  ) Current Attention Level: Focused Memory: Decreased short-term memory Following Commands: Follows one step commands inconsistently Safety/Judgement: Decreased awareness of safety;Decreased awareness of deficits Awareness: Intellectual Problem Solving: Slow processing;Decreased initiation;Difficulty sequencing;Requires verbal cues;Requires tactile cues General Comments: pt makes off topic comments occasionally  and needs cueing to re-orient to task.  pt indicating being "afraid of the light" when PT mentions turning on a light, but denies being sensitive to the light.  No family present to A with determining baseline cognition.      General Comments      Exercises        Assessment/Plan    PT Assessment Patient needs continued PT services  PT Diagnosis Difficulty walking;Generalized weakness   PT Problem List Decreased strength;Decreased activity tolerance;Decreased balance;Decreased mobility;Decreased coordination;Decreased cognition;Decreased knowledge of use of DME;Decreased safety awareness;Cardiopulmonary status limiting activity  PT Treatment Interventions DME instruction;Gait training;Functional mobility training;Therapeutic activities;Therapeutic exercise;Balance training;Neuromuscular re-education;Cognitive remediation;Patient/family education   PT Goals (Current goals can be found in the Care Plan section) Acute Rehab PT Goals Patient Stated Goal: pt stated "I get antsy." when asked what about goals.   PT Goal Formulation: Patient unable to participate in goal setting Time For Goal Achievement: 07/19/14 Potential to Achieve Goals: Good    Frequency Min 3X/week   Barriers to discharge        Co-evaluation               End of Session Equipment Utilized During Treatment: Gait belt;Oxygen Activity Tolerance: Patient tolerated treatment well Patient left: in chair;with call bell/phone within reach;with nursing/sitter in room Nurse Communication: Mobility status;Need for lift equipment         Time: 1257-1322 PT Time Calculation (min) (ACUTE ONLY): 25 min   Charges:   PT Evaluation $Initial PT Evaluation Tier I: 1 Procedure PT Treatments $Therapeutic Activity: 8-22 mins   PT G CodesCatarina Hartshorn, [place] [phone] 07/05/2014, 1:48 PM

## 2014-07-05 NOTE — Progress Notes (Signed)
Pt was at SNF less than 24 hrs before readmitted for this acute episode. Supportive partner. I will place inpt rehab consult to assess if inpt rehab is an option for his rehab recovery. Discussed with SW. [phone]

## 2014-07-05 NOTE — Progress Notes (Signed)
PULMONARY / CRITICAL CARE MEDICINE   Name: Dayshawn Irizarry MRN: [id number] DOB: November 09, 1955    ADMISSION DATE:  06/30/2014 CONSULTATION DATE:  11/20  REFERRING MD :  Neuro  CHIEF COMPLAINT:  Can't walk  INITIAL PRESENTATION:  58 yo WM with progressive ascending neuromuscular weakness over several months who presents with inability to walk, dysarthria, profound weakness and impending respiratory failure. 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  Neurology has requested PCCM to placed HD catheter for plasma exchange and he needs intubation for airway protection.   STUDIES:  11/20 Head CT >> no acute intracranial abnormalities, degenerative changes throughout cervical spine  SIGNIFICANT EVENTS: 11/11 - 11/18:  Admitted for N, V, HCAP, Dysphagia, Chronic back pain, Urinary retention and generalized weakness 11/20:  Intubated for progressive weakness/ascending paralysis and plasmapheresis started.  SUBJECTIVE:  Awake, upright  VITAL SIGNS: Temp:  [97.9 F (36.6 C)-98.2 F (36.8 C)] 97.9 F (36.6 C) (11/23 0000) Pulse Rate:  [76-107] 83 (11/23 0700) Resp:  [9-33] 19 (11/23 0700) BP: (90-178)/(44-95) 150/95 mmHg (11/23 0700) SpO2:  [93 %-99 %] 97 % (11/23 0700) FiO2 (%):  [40 %] 40 % (11/23 0343) Weight:  [188 lb (85.276 kg)-188 lb 11.4 oz (85.6 kg)] 188 lb 11.4 oz (85.6 kg) (11/23 0600) HEMODYNAMICS:   VENTILATOR SETTINGS: Vent Mode:  [-] PRVC FiO2 (%):  [40 %] 40 % Set Rate:  [16 bmp] 16 bmp Vt Set:  [570 mL] 570 mL PEEP:  [5 cmH20] 5 cmH20 Pressure Support:  [5 cmH20-8 cmH20] 8 cmH20 Plateau Pressure:  [21 cmH20-22 cmH20] 21 cmH20 INTAKE / OUTPUT:  Intake/Output Summary (Last 24 hours) at 07/03/14 0734 Last data filed at 07/03/14 0600  Gross per 24 hour  Intake 1173.02 ml  Output   1250 ml  Net -76.98 ml    PHYSICAL EXAMINATION: General: on vent alert, anxious Neuro:  rass 0 awake, cooperative, alert, power 4 over 5 all 4 extremities HEENT:  No JVD /LAN Cardiovascular:  HSR RRR Lungs:  coarse mild  Abdomen:  +bs, no r/g Musculoskeletal:  Some edema  Skin:  warm  LABS:  CBC  Recent Labs Lab 07/01/14 0330 07/01/14 1129 07/02/14 0244 07/03/14 0530  WBC 9.5  --  14.2* 13.6*  HGB 12.7* 13.6 13.2 11.2*  HCT 37.5* 40.0 38.7* 32.9*  PLT 213  --  195 196   Coag's No results for input(s): APTT, INR in the last 168 hours. BMET  Recent Labs Lab 07/01/14 0330 07/01/14 1129 07/02/14 0244 07/03/14 0530  NA 145 145 145 145  K 3.4* 3.7 4.1 3.6*  CL 106 104 107 104  CO2 26  --  25 29  BUN 33* 30* 30* 33*  CREATININE 0.89 0.90 0.74 0.81  GLUCOSE 147* 141* 152* 136*   Electrolytes  Recent Labs Lab 07/01/14 0330 07/02/14 0244 07/03/14 0530  CALCIUM 9.3 9.4 9.2  MG 2.1 2.0 2.4  PHOS 3.8 3.1 2.6   Sepsis Markers No results for input(s): LATICACIDVEN, PROCALCITON, O2SATVEN in the last 168 hours. ABG  Recent Labs Lab 07/01/14 0245 07/02/14 0404 07/03/14 0348  PHART 7.439 7.434 7.441  PCO2ART 38.5 40.2 44.5  PO2ART 93.0 68.2* 68.9*   Liver Enzymes No results for input(s): AST, ALT, ALKPHOS, BILITOT, ALBUMIN in the last 168 hours. Cardiac Enzymes No results for input(s): TROPONINI, PROBNP in the last 168 hours. Glucose  Recent Labs Lab 07/02/14 0435 07/02/14 0744 07/02/14 1139 07/02/14 2006 07/03/14 0004 07/03/14 0405  GLUCAP 146* 171* 143* 144* 155* 153*    Imaging 11/25-  int prom, ett slight high, no defined infiltrates, edema?  ASSESSMENT / PLAN:  NEUROLOGIC A:   Progressive paralysis -  GBS  (per neuro)-improving with plasma exchange,NIf improving P:   RASS goal: 0 Plasma exchange - last dose now, then NIF, VC Aggressive pt needed  PULMONARY OETT 1/20>> A: VDRF due to ascending paralysis (see neuro) Edema? P:   SBT to begin post plasmaxhchange, cpap5 ps 5, goal 1 hr, assess rsbi, NIF Nif/ vc daily Consider lasix to neg balance  CARDIOVASCULAR CVL 1/20 rt IJ HD cath>> At risk autonomic  dysfxn A:  Hx of htn P:   Hold antihypertensives given fluctuant SBP. tele  RENAL A:   Edema P:   BMET in AM  Lasix Ensure kvo  GASTROINTESTINAL A:  No issues P:   TF per nutrition Evaluate for BM,  May need addition colace, dulcolax, miralax  HEMATOLOGIC A:   Anemia, dilutional? P:  lovenox noted CBC in am  Transfuse per ICU protocol lasix  INFECTIOUS A:   Leukocytosis P:   Monitor Abx and fever curve.  ENDOCRINE A:   Hyperglycemia  P:   SSI  Pt updated  Ccm time 30 min   Lavon Paganini. Titus Mould, MD, [place] Pgr: [facility]

## 2014-07-05 NOTE — Clinical Social Work Note (Signed)
Clinical Social Worker continuing to follow patient and family for support and discharge planning needs.  Patient remains intubated with continued Plasmapheresis treatments until Friday 11/27.  CSW to update FL2 and initiate SNF search in [facility] once patient is extubated.  CSW remains available for support and to facilitate patient discharge needs once medically stable.  Frank [name], Frank [name]

## 2014-07-05 NOTE — Progress Notes (Signed)
NIF -45 VC 1.8L. Pt had good effort. Thumbs up. Now on wean.

## 2014-07-05 NOTE — Clinical Social Work Note (Signed)
Clinical Social Worker continuing to follow patient and family for support and discharge planning needs.  Patient is now extubated and has worked with therapies who are currently recommending inpatient rehab.  CSW spoke with inpatient rehab admissions coordinator to provide background information.  CSW updated FL2 and initiated SNF search in [facility] per patient partner request.  Patient partner states that he is not yet willing to agree to no return to [facility] but is open to other options.  CSW to follow up with available bed offers, but will await initiating insurance authorization for determination from inpatient rehab.  CSW remains available for support and to facilitate patient discharge needs once medically ready.  Barbette Or, [place]

## 2014-07-05 NOTE — Progress Notes (Signed)
NIF -30 FVC 1.6L

## 2014-07-06 LAB — COMPREHENSIVE METABOLIC PANEL
ALT: 33 U/L (ref 0–53)
AST: 25 U/L (ref 0–37)
Albumin: 4.6 g/dL (ref 3.5–5.2)
Alkaline Phosphatase: 39 U/L (ref 39–117)
Anion gap: 14 (ref 5–15)
BUN: 21 mg/dL (ref 6–23)
CALCIUM: 9.8 mg/dL (ref 8.4–10.5)
CO2: 30 meq/L (ref 19–32)
Chloride: 98 mEq/L (ref 96–112)
Creatinine, Ser: 0.79 mg/dL (ref 0.50–1.35)
GFR calc Af Amer: >90 mL/min (ref >90)
Glucose, Bld: 128 mg/dL — ABNORMAL HIGH (ref 70–99)
Potassium: 3.6 mEq/L — ABNORMAL LOW (ref 3.7–5.3)
SODIUM: 142 meq/L (ref 137–147)
TOTAL PROTEIN: 6.5 g/dL (ref 6.0–8.3)
Total Bilirubin: 1 mg/dL (ref 0.3–1.2)

## 2014-07-06 LAB — GLUCOSE, CAPILLARY
Glucose-Capillary: 123 mg/dL — ABNORMAL HIGH (ref 70–99)
Glucose-Capillary: 117 mg/dL — ABNORMAL HIGH (ref 70–99)
Glucose-Capillary: 273 mg/dL — ABNORMAL HIGH (ref 70–99)

## 2014-07-06 MED ORDER — FENTANYL CITRATE 0.05 MG/ML IJ SOLN
INTRAMUSCULAR | Status: AC
  Filled 2014-07-06: qty 2

## 2014-07-06 MED ORDER — POTASSIUM CHLORIDE 10 MEQ/50ML IV SOLN
10.0000 meq | INTRAVENOUS | Status: DC
  Administered 2014-07-06 (×4): 10 meq via INTRAVENOUS
  Filled 2014-07-06: qty 50

## 2014-07-06 MED ORDER — FLEET ENEMA 7-19 GM/118ML RE ENEM
1.0000 | ENEMA | Freq: Once | RECTAL | Status: AC
  Administered 2014-07-06: 1 via RECTAL
  Filled 2014-07-06: qty 1

## 2014-07-06 MED ORDER — FAMOTIDINE 20 MG PO TABS
20.0000 mg | ORAL_TABLET | Freq: Every day | ORAL | Status: DC
  Administered 2014-07-07 – 2014-07-11 (×5): 20 mg via ORAL
  Filled 2014-07-06 (×5): qty 1

## 2014-07-06 MED ORDER — BISACODYL 10 MG RE SUPP
10.0000 mg | Freq: Every day | RECTAL | Status: DC | PRN
  Administered 2014-07-06: 10 mg via RECTAL
  Filled 2014-07-06: qty 1

## 2014-07-06 MED ORDER — FENTANYL CITRATE 0.05 MG/ML IJ SOLN
12.5000 ug | INTRAMUSCULAR | Status: DC | PRN
  Administered 2014-07-06 – 2014-07-11 (×33): 50 ug via INTRAVENOUS
  Filled 2014-07-06 (×32): qty 2

## 2014-07-06 NOTE — Progress Notes (Signed)
Patient upset and refusing all help from nursing staff. His sitter, nurse tech, RN Regulatory affairs officer), and charge nurse all offered to help patient use restroom but he refused all care. Patient refused vital signs to be taken and refused his night and PRN medications. Patient attempted to call 911 from room phone so he could report the nursing staff for negligence even though he will not let us help him.Writer attempted to calm patient down but his anger increased. Will continue to monitor patient closely. Ileene Rubens Moriya Mitchell,RN

## 2014-07-06 NOTE — Progress Notes (Signed)
PULMONARY / CRITICAL CARE MEDICINE   Name: Frank [name] MRN: [id number] DOB: [birth year]    ADMISSION DATE:  06/30/2014 CONSULTATION DATE:  11/20  REFERRING MD :  Neuro  CHIEF COMPLAINT:  Can't walk  INITIAL PRESENTATION:  58 yo WM with progressive ascending neuromuscular weakness over several months who presents with inability to walk, dysarthria, profound weakness and impending respiratory failure. 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  Neurology has requested PCCM to placed HD catheter for plasma exchange and he needs intubation for airway protection.   STUDIES:  11/20 Head CT >> no acute intracranial abnormalities, degenerative changes throughout cervical spine  SIGNIFICANT EVENTS: 11/11 - 11/18:  Admitted for N, V, HCAP, Dysphagia, Chronic back pain, Urinary retention and generalized weakness 11/20:  Intubated for progressive weakness/ascending paralysis and plasmapheresis started.  SUBJECTIVE:  tol extubation well 11/25  VITAL SIGNS: Temp:  [97.9 F (36.6 C)-98.2 F (36.8 C)] 97.9 F (36.6 C) (11/23 0000) Pulse Rate:  [76-107] 83 (11/23 0700) Resp:  [9-33] 19 (11/23 0700) BP: (90-178)/(44-95) 150/95 mmHg (11/23 0700) SpO2:  [93 %-99 %] 97 % (11/23 0700) FiO2 (%):  [40 %] 40 % (11/23 0343) Weight:  [188 lb (85.276 kg)-188 lb 11.4 oz (85.6 kg)] 188 lb 11.4 oz (85.6 kg) (11/23 0600) HEMODYNAMICS: CV stable    PHYSICAL EXAMINATION: General: awake and alert off vent  Neuro:  Improved strength.  -50 NIF  2.5L  VC improved HEENT:  No JVD /LAN Cardiovascular:  HSR RRR Lungs: coarse mild  Abdomen:  +bs, no r/g Musculoskeletal:  Some edema  Skin:  warm  All LABS reviewed .  Imaging No film  ASSESSMENT / PLAN:  NEUROLOGIC A:   Progressive paralysis -  GBS  (per neuro)-improving with plasma exchange,NIf improving VC better P:   Now off Plasma exchange.    PULMONARY OETT 11/20>>11/25 A: VDRF resolved  P:   Flutter valve Nif/ vc daily   CARDIOVASCULAR CVL 11/20 rt IJ HD  cath>> At risk autonomic  dysfxn A:  Hx of htn P:  Hold antihypertensives given fluctuant SBP. tele  RENAL A:   Edema P:   BMET in AM   GASTROINTESTINAL A: obstipation, dysphagia P:   Bowel program SLP eval for swallow  HEMATOLOGIC A:   Anemia, dilutional? P:  lovenox noted CBC in am  Transfuse per ICU protocol   INFECTIOUS A:   No issues P:   NO ABX needed  Get foley OUT  ENDOCRINE A:   Hyperglycemia  P:   SSI  Pt updated  Ccm time 30 min   Plan Tfr to floor  CIR consult SLP EVal.  Mariel Sleet Beeper  [phone]  Cell  [phone]  If no response or cell goes to voicemail, call beeper [phone] 07/06/2014 10:07 AM

## 2014-07-06 NOTE — Progress Notes (Signed)
NIF - 50 x2 VC 2.5L x2  RN and MD aware.

## 2014-07-06 NOTE — Progress Notes (Signed)
NEURO HOSPITALIST PROGRESS NOTE   SUBJECTIVE:                                                                                                                        Patient was very drowsy during my exam. Patient was extubated yesterday. He appears to be very anxious. He was a three person assist to get out of bed this a.m and sat in the chair briefly before he asked to be taken back to bed. No overnight events. He has completed 5 PLEX session so far.  CIR has been rec by PT.   OBJECTIVE:                                                                                                                           Vital signs in last 24 hours: Temp:  [98 F (36.7 C)-99.2 F (37.3 C)] 98.4 F (36.9 C) (11/26 0755) Pulse Rate:  [71-124] 108 (11/26 0755) Resp:  [11-37] 23 (11/26 0755) BP: (113-198)/(61-117) 151/109 mmHg (11/26 0755) SpO2:  [87 %-100 %] 100 % (11/26 0755) FiO2 (%):  [40 %] 40 % (11/25 0919) Weight:  [180 lb 5.4 oz (81.8 kg)] 180 lb 5.4 oz (81.8 kg) (11/26 0349)  Intake/Output from previous day: 11/25 0701 - 11/26 0700 In: 620.4 [I.V.:170.4; NG/GT:300; IV Piggyback:150] Out: 5090 [Urine:5090] Intake/Output this shift:   Nutritional status: Diet NPO time specified  Past Medical History  Diagnosis Date  . COLONIC POLYPS 02/01/2008  . DIABETES MELLITUS, TYPE II 05/20/2010  . HYPERLIPIDEMIA 10/09/2008  . ANXIETY DEPRESSION 02/01/2008  . ERECTILE DYSFUNCTION 10/09/2008  . ADD 10/09/2008  . SLEEP APNEA, OBSTRUCTIVE 02/01/2008  . MORTON'S NEUROMA 05/20/2010  . PERIPHERAL NEUROPATHY 05/20/2010  . Other specified forms of hearing loss 06/27/2009  . HYPERTENSION 10/09/2008  . HEMORRHOIDS 02/01/2008  . ALLERGIC RHINITIS 10/09/2008  . Stricture and stenosis of esophagus 02/02/2008  . GERD 02/01/2008  . HIATAL HERNIA 02/01/2008  . ERECTILE DYSFUNCTION, ORGANIC 05/20/2010  . WRIST PAIN, LEFT 12/05/2009  . FOOT PAIN, LEFT 05/20/2010  . PERIPHERAL EDEMA  05/20/2010  . DYSPNEA 03/12/2010  . Abdominal pain, unspecified site 01/19/2009  . Abdominal pain, left lower quadrant 06/06/2010  . Type II or unspecified type diabetes mellitus without  mention of complication, uncontrolled 11/14/2010   Physical exam:  Very anxious. no apparent distress. He is fidgeting in bed. Head: normocephalic. Neck: supple, no bruits, no JVD. Cardiac: no murmurs. Lungs: clear. Abdomen: soft, no tender, no mass. Bowel sounds are present. Extremities: no edema.  Neurologic Exam:  General: Mental Status: Extubated and awake in bed. Very anxious and ask for his friend [name] to be called. He is aware today is Thanksgiving.  Cranial Nerves: II: Visual fields grossly normal, pupils equal, round, reactive to light and accommodation III,IV, VI: ptosis not present, extra-ocular motions intact bilaterally V,VII: smile symmetric, facial light touch sensation normal bilaterally VIII: hearing normal bilaterally  Motor: 3/5 plantar/dorsi flexion, 4/5 hip flexion, 4/5 hand weakness, 4/5 weakness biceps/deltoids. Tone and bulk:normal tone throughout; no atrophy noted Sensory:  light touch intact throughout, bilaterally Deep Tendon Reflexes:  Absent  Plantars: Right: absent   Left: absent Cerebellar: No tested  Lab Results: Lab Results  Component Value Date/Time   CHOL 209* 05/04/2014 09:00 AM   Lipid Panel No results for input(s): CHOL, TRIG, HDL, CHOLHDL, VLDL, LDLCALC in the last 72 hours.  Studies/Results: Dg Chest Port 1 View  07/05/2014   CLINICAL DATA:  Acute respiratory failure ; intubated patient.  EXAM: PORTABLE CHEST - 1 VIEW  COMPARISON:  Portable chest x-ray of July 04, 2014.  FINDINGS: The lungs are adequately inflated with decreased bibasilar atelectasis. The heart and pulmonary vascularity are normal. There is no pleural effusion or pneumothorax. The endotracheal tube tip projects 6.3 cm above the crotch of the carina. The esophagogastric tube tip  projects in the gastric cardia but the proximal port lies at or above the GE junction. Advancement by 5 to 10 cm is recommended. The right internal jugular Cordis sheath tip projects over the proximal SVC.  IMPRESSION: Improved appearance of both bases consistent with resolving atelectasis. There is no evidence of pulmonary edema. The support tubes lines are in position as described. Advancement of the esophagogastric tube is recommended.   Electronically Signed   By: David  Martinique   On: 07/05/2014 07:04   Dg Abd Portable 1v  07/04/2014   CLINICAL DATA:  Orogastric tube placement  EXAM: PORTABLE ABDOMEN - 1 VIEW  COMPARISON:  None.  FINDINGS: Orogastric tube with the tip projecting over the fundus of the stomach. There is no bowel dilatation to suggest obstruction. There is no evidence of pneumoperitoneum, portal venous gas or pneumatosis. There are no pathologic calcifications along the expected course of the ureters.The osseous structures are unremarkable.  IMPRESSION: Orogastric tube with the tip projecting over the fundus of the stomach.   Electronically Signed   By: Kathreen Devoid   On: 07/04/2014 21:35    MEDICATIONS                                                                                                                        Scheduled: . antiseptic oral rinse  7 mL Mouth Rinse QID  . chlorhexidine  15 mL Mouth/Throat BID  . docusate  100 mg Oral BID  . enoxaparin (LOVENOX) injection  40 mg Subcutaneous Q24H  . famotidine (PEPCID) IV  20 mg Intravenous Q24H  . feeding supplement (PRO-STAT SUGAR FREE 64)  30 mL Per Tube TID  . fentaNYL  100 mcg Intravenous Once  . furosemide  40 mg Intravenous Q12H  . heparin  1,000 Units Intracatheter Once  . polyethylene glycol  17 g Oral Daily  . potassium chloride  10 mEq Intravenous Q1 Hr x 4    ASSESSMENT/PLAN:                                                                                                           58 yo M with ascending  numbness/weakness and pain, areflexia, and albuminocytologic dissociation. Overall neurological syndrome and CSF finding consistent with guillian-barre syndrome. He has made improvement. Has been extubated now and respiratory status seem to be doing well.  Main issue is severe anxiety. Will need reorientation. Has completed 5/5 PLEX. May require more PLEX in the next few weeks if needed.  Disposition deferred to critical care. CIR has been rec by PT.   Will continue to follow.  Case discussed with Dr Armida Sans.  Jessee Avers, MD PGY-3 Internal Medicine Teaching Service Pager: [phone] 07/06/2014, 8:20 AM

## 2014-07-06 NOTE — Progress Notes (Signed)
[facility] Progress Note Patient Name: [name] DOB: February 03, 1956 MRN: [id number]   Date of Service  07/06/2014  HPI/Events of Note  pain  eICU Interventions  Increase prn dose of fentanyl     Intervention Category Intermediate Interventions: Pain - evaluation and management  [name], [place] 07/06/2014, 3:41 AM

## 2014-07-06 NOTE — Evaluation (Signed)
Clinical/Bedside Swallow Evaluation Patient Details  Name: Frank [name] MRN: [id number] Date of Birth: 1956/07/23  Today's Date: 07/06/2014 Time: 1052-1131 SLP Time Calculation (min) (ACUTE ONLY): 39 min  Past Medical History:  Past Medical History  Diagnosis Date  . COLONIC POLYPS 02/01/2008  . DIABETES MELLITUS, TYPE II 05/20/2010  . HYPERLIPIDEMIA 10/09/2008  . ANXIETY DEPRESSION 02/01/2008  . ERECTILE DYSFUNCTION 10/09/2008  . ADD 10/09/2008  . SLEEP APNEA, OBSTRUCTIVE 02/01/2008  . MORTON'S NEUROMA 05/20/2010  . PERIPHERAL NEUROPATHY 05/20/2010  . Other specified forms of hearing loss 06/27/2009  . HYPERTENSION 10/09/2008  . HEMORRHOIDS 02/01/2008  . ALLERGIC RHINITIS 10/09/2008  . Stricture and stenosis of esophagus 02/02/2008  . GERD 02/01/2008  . HIATAL HERNIA 02/01/2008  . ERECTILE DYSFUNCTION, ORGANIC 05/20/2010  . WRIST PAIN, LEFT 12/05/2009  . FOOT PAIN, LEFT 05/20/2010  . PERIPHERAL EDEMA 05/20/2010  . DYSPNEA 03/12/2010  . Abdominal pain, unspecified site 01/19/2009  . Abdominal pain, left lower quadrant 06/06/2010  . Type II or unspecified type diabetes mellitus without mention of complication, uncontrolled 11/14/2010   Past Surgical History:  Past Surgical History  Procedure Laterality Date  . Carpal tunnel release    . Rotator cuff repair    . Esophageal dilation  july 2009  . Eye surgery    . Esophagogastroduodenoscopy N/A 06/27/2014    Procedure: ESOPHAGOGASTRODUODENOSCOPY (EGD);  Surgeon: Lafayette Dragon, MD;  Location: [facility];  Service: Endoscopy;  Laterality: N/A;   HPI:  58 yo WM with progressive ascending neuromuscular weakness over several months who presents with inability to walk, dysarthria, profound weakness and impending respiratory failure. 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 Pt was intubated 11/20-11/25. He describes a h/o esophageal stretching and new onset nasal regurgitation.   Assessment / Plan / Recommendation Clinical Impression  Pt has generalized weakness impacting  his oral phase of swallow, with suspected impact on pharyngeal phase as well. Oral manipulation and transit is slow, but pt is able to effectively clear his oral cavity. Although pt has very reduced hyolaryngeal movement upon palpation, there were no overt signs of aspiration observed across challenging, including the 3 ounce water test. Recommend initiation of Dys 2 textures and thin liquids with full supervision. SLP to follow closely for tolerance and possible objective testing.    Aspiration Risk  Moderate    Diet Recommendation Dysphagia 2 (Fine chop);Thin liquid   Liquid Administration via: Cup;Straw Medication Administration: Crushed with puree Supervision: Patient able to self feed;Full supervision/cueing for compensatory strategies Compensations: Slow rate;Small sips/bites Postural Changes and/or Swallow Maneuvers: Seated upright 90 degrees;Upright 30-60 min after meal    Other  Recommendations Oral Care Recommendations: Oral care BID   Follow Up Recommendations  Inpatient Rehab    Frequency and Duration min 2x/week  2 weeks   Pertinent Vitals/Pain [facility]    SLP Swallow Goals     Swallow Study Prior Functional Status       General HPI: 58 yo WM with progressive ascending neuromuscular weakness over several months who presents with inability to walk, dysarthria, profound weakness and impending respiratory failure. 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 Pt was intubated 11/20-11/25. He describes a h/o esophageal stretching and new onset nasal regurgitation. Type of Study: Bedside swallow evaluation Previous Swallow Assessment: BSE 11/16 with suspected GI involvement and ? neurological component, recommended thin liquids pending MBS however pt then declined study; EGD 2009 with dilatation of stricture, esophagram 2007 + reflux @ EG junction, tertiary contractions, prominent cricopharyngeus, barium tablet cleared Diet Prior to this Study: NPO Temperature Spikes Noted: Yes (  low grade) Respiratory  Status: Nasal cannula History of Recent Intubation: Yes Length of Intubations (days): 5 days Date extubated: 07/05/14 Behavior/Cognition: Alert;Cooperative;Pleasant mood Oral Cavity - Dentition: Adequate natural dentition (abnormality on roof of mouth, pt says not new) Self-Feeding Abilities: Needs assist Patient Positioning: Upright in bed Baseline Vocal Quality: Low vocal intensity Volitional Cough: Weak Volitional Swallow: Able to elicit    Oral/Motor/Sensory Function Overall Oral Motor/Sensory Function:  (general weakness)   Ice Chips Ice chips: Impaired Presentation: Spoon Pharyngeal Phase Impairments: Decreased hyoid-laryngeal movement   Thin Liquid Thin Liquid: Impaired Presentation: Cup;Self Fed;Straw Oral Phase Impairments: Reduced lingual movement/coordination;Impaired anterior to posterior transit Oral Phase Functional Implications: Prolonged oral transit Pharyngeal  Phase Impairments: Decreased hyoid-laryngeal movement    Nectar Thick Nectar Thick Liquid: Not tested   Honey Thick Honey Thick Liquid: Not tested   Puree Puree: Impaired Presentation: Spoon;Self Fed Oral Phase Impairments: Impaired anterior to posterior transit;Reduced lingual movement/coordination Oral Phase Functional Implications: Prolonged oral transit Pharyngeal Phase Impairments: Decreased hyoid-laryngeal movement   Solid   GO    Solid: Impaired Oral Phase Impairments: Reduced lingual movement/coordination;Impaired anterior to posterior transit Oral Phase Functional Implications: Other (comment) (delayed oral transit) Pharyngeal Phase Impairments: Decreased hyoid-laryngeal movement       [name], M.A. CCC-SLP [phone]  [name] 07/06/2014,11:53 AM

## 2014-07-06 NOTE — Progress Notes (Signed)
[facility] Progress Note Patient Name: Tod Abrahamsen DOB: July 03, 1956 MRN: [id number]   Date of Service  07/06/2014  HPI/Events of Note    eICU Interventions  Hypokalemia, repleted      Intervention Category Minor Interventions: Electrolytes abnormality - evaluation and management  Hargis Vandyne, [place] 07/06/2014, 6:06 AM

## 2014-07-07 ENCOUNTER — Inpatient Hospital Stay (HOSPITAL_COMMUNITY): Payer: BC Managed Care – PPO

## 2014-07-07 DIAGNOSIS — G8252 Quadriplegia, C1-C4 incomplete: Secondary | ICD-10-CM

## 2014-07-07 DIAGNOSIS — G8254 Quadriplegia, C5-C7 incomplete: Secondary | ICD-10-CM

## 2014-07-07 DIAGNOSIS — F419 Anxiety disorder, unspecified: Secondary | ICD-10-CM

## 2014-07-07 LAB — BASIC METABOLIC PANEL
ANION GAP: 15 (ref 5–15)
BUN: 31 mg/dL — ABNORMAL HIGH (ref 6–23)
CHLORIDE: 97 meq/L (ref 96–112)
CO2: 27 meq/L (ref 19–32)
CREATININE: 1.04 mg/dL (ref 0.50–1.35)
Calcium: 9.9 mg/dL (ref 8.4–10.5)
GFR calc Af Amer: 88 mL/min — ABNORMAL LOW (ref >90)
GFR calc non Af Amer: 76 mL/min — ABNORMAL LOW (ref >90)
Glucose, Bld: 152 mg/dL — ABNORMAL HIGH (ref 70–99)
Potassium: 3.6 mEq/L — ABNORMAL LOW (ref 3.7–5.3)
Sodium: 139 mEq/L (ref 137–147)

## 2014-07-07 LAB — CBC
HCT: 37.5 % — ABNORMAL LOW (ref 39.0–52.0)
HEMOGLOBIN: 13 g/dL (ref 13.0–17.0)
MCH: 30.7 pg (ref 26.0–34.0)
MCHC: 34.7 g/dL (ref 30.0–36.0)
MCV: 88.4 fL (ref 78.0–100.0)
Platelets: 336 10*3/uL (ref 150–400)
RBC: 4.24 MIL/uL (ref 4.22–5.81)
RDW: 12.8 % (ref 11.5–15.5)
WBC: 9.1 10*3/uL (ref 4.0–10.5)

## 2014-07-07 LAB — POCT I-STAT, CHEM 8
BUN: 31 mg/dL — AB (ref 6–23)
CHLORIDE: 93 meq/L — AB (ref 96–112)
Calcium, Ion: 1.24 mmol/L (ref 1.13–1.30)
Creatinine, Ser: 1 mg/dL (ref 0.50–1.35)
Glucose, Bld: 180 mg/dL — ABNORMAL HIGH (ref 70–99)
HCT: 38 % — ABNORMAL LOW (ref 39.0–52.0)
Hemoglobin: 12.9 g/dL — ABNORMAL LOW (ref 13.0–17.0)
Potassium: 3.4 mEq/L — ABNORMAL LOW (ref 3.7–5.3)
Sodium: 138 mEq/L (ref 137–147)
TCO2: 29 mmol/L (ref 0–100)

## 2014-07-07 MED ORDER — SODIUM CHLORIDE 0.9 % IV SOLN
4.0000 g | Freq: Once | INTRAVENOUS | Status: AC
  Administered 2014-07-07: 4 g via INTRAVENOUS
  Filled 2014-07-07 (×4): qty 40

## 2014-07-07 MED ORDER — DULOXETINE HCL 30 MG PO CPEP
30.0000 mg | ORAL_CAPSULE | Freq: Every day | ORAL | Status: DC
  Administered 2014-07-08 – 2014-07-11 (×4): 30 mg via ORAL
  Filled 2014-07-07 (×4): qty 1

## 2014-07-07 MED ORDER — DIPHENHYDRAMINE HCL 25 MG PO CAPS
25.0000 mg | ORAL_CAPSULE | Freq: Once | ORAL | Status: AC
  Administered 2014-07-07: 25 mg via ORAL
  Filled 2014-07-07: qty 1

## 2014-07-07 MED ORDER — ACD FORMULA A 0.73-2.45-2.2 GM/100ML VI SOLN
Status: AC
  Administered 2014-07-07: 500 mL via INTRAVENOUS
  Filled 2014-07-07: qty 500

## 2014-07-07 MED ORDER — CALCIUM CARBONATE ANTACID 500 MG PO CHEW
CHEWABLE_TABLET | ORAL | Status: AC
  Administered 2014-07-07 (×2): 400 mg
  Administered 2014-07-07: 09:00:00
  Filled 2014-07-07: qty 4

## 2014-07-07 MED ORDER — SODIUM CHLORIDE 0.9 % IV SOLN
INTRAVENOUS | Status: AC
  Administered 2014-07-07 (×2): via INTRAVENOUS_CENTRAL
  Filled 2014-07-07 (×3): qty 200

## 2014-07-07 MED ORDER — PREGABALIN 75 MG PO CAPS
200.0000 mg | ORAL_CAPSULE | Freq: Two times a day (BID) | ORAL | Status: DC
  Administered 2014-07-07 – 2014-07-11 (×8): 200 mg via ORAL
  Filled 2014-07-07 (×2): qty 1
  Filled 2014-07-07 (×2): qty 2
  Filled 2014-07-07: qty 1
  Filled 2014-07-07 (×2): qty 2
  Filled 2014-07-07 (×2): qty 1
  Filled 2014-07-07: qty 2
  Filled 2014-07-07 (×2): qty 1
  Filled 2014-07-07 (×2): qty 2
  Filled 2014-07-07: qty 1
  Filled 2014-07-07: qty 2

## 2014-07-07 NOTE — Procedures (Signed)
Objective Swallowing Evaluation: Modified Barium Swallowing Study  Patient Details  Name: Frank [name] MRN: [id number] Date of Birth: January 29, 1956  Today's Date: 07/07/2014 Time: 3903-0092 SLP Time Calculation (min) (ACUTE ONLY): 23 min  Past Medical History:  Past Medical History  Diagnosis Date  . COLONIC POLYPS 02/01/2008  . DIABETES MELLITUS, TYPE II 05/20/2010  . HYPERLIPIDEMIA 10/09/2008  . ANXIETY DEPRESSION 02/01/2008  . ERECTILE DYSFUNCTION 10/09/2008  . ADD 10/09/2008  . SLEEP APNEA, OBSTRUCTIVE 02/01/2008  . MORTON'S NEUROMA 05/20/2010  . PERIPHERAL NEUROPATHY 05/20/2010  . Other specified forms of hearing loss 06/27/2009  . HYPERTENSION 10/09/2008  . HEMORRHOIDS 02/01/2008  . ALLERGIC RHINITIS 10/09/2008  . Stricture and stenosis of esophagus 02/02/2008  . GERD 02/01/2008  . HIATAL HERNIA 02/01/2008  . ERECTILE DYSFUNCTION, ORGANIC 05/20/2010  . WRIST PAIN, LEFT 12/05/2009  . FOOT PAIN, LEFT 05/20/2010  . PERIPHERAL EDEMA 05/20/2010  . DYSPNEA 03/12/2010  . Abdominal pain, unspecified site 01/19/2009  . Abdominal pain, left lower quadrant 06/06/2010  . Type II or unspecified type diabetes mellitus without mention of complication, uncontrolled 11/14/2010   Past Surgical History:  Past Surgical History  Procedure Laterality Date  . Carpal tunnel release    . Rotator cuff repair    . Esophageal dilation  july 2009  . Eye surgery    . Esophagogastroduodenoscopy N/A 06/27/2014    Procedure: ESOPHAGOGASTRODUODENOSCOPY (EGD);  Surgeon: Frank Dragon, MD;  Location: [facility];  Service: Endoscopy;  Laterality: N/A;   HPI:  58 yo WM with progressive ascending neuromuscular weakness over several months who presents with inability to walk, dysarthria, profound weakness and impending respiratory failure. 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 Pt was intubated 11/20-11/25. He describes a h/o esophageal stretching and new onset nasal regurgitation.     Assessment / Plan / Recommendation Clinical Impression  Dysphagia Diagnosis: Mild-Moderate oral phase dysphagia Clinical impression: The patient presents with a mild-moderate oral phase dysphagia characterized by delayed oral transit and reduced lingual manipulation of bolus by cup, resulting in premature spillage and penetration of thin liquid which remained above the vocal folds and was effectively ejected out by the patient. Use of straw eliminated difficulties with oral phase, and no further penetration was noted. No difficulties noted with puree or solid. Given these findings, the pt is at risk of aspiration when drinking by cup but this risk is significantly reduced when using straw, thereby avoiding lingual manipulation and oral transit difficulties. Recommend advancing diet to dysphagia 3, thin liquids by straw only, meds whole with liquid by straw. Continue providing full supervision to assist with feeding but allow pt to self-feed as much as possible. Speech will continue to follow at least x1 to ensure diet tolerance and review strategies.      Treatment Recommendation  Therapy as outlined in treatment plan below    Diet Recommendation Dysphagia 3 (Mechanical Soft);Thin liquid   Liquid Administration via: Straw Medication Administration: Whole meds with liquid Supervision: Patient able to self feed;Full supervision/cueing for compensatory strategies Compensations: Slow rate;Small sips/bites Postural Changes and/or Swallow Maneuvers: Seated upright 90 degrees    Other  Recommendations Oral Care Recommendations: Oral care BID   Follow Up Recommendations  Inpatient Rehab    Frequency and Duration min 1 x/week  1 week   Pertinent Vitals/Pain n/a    SLP Swallow Goals     General HPI: 58 yo WM with progressive ascending neuromuscular weakness over several months who presents with inability to walk, dysarthria, profound weakness and impending respiratory failure. 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  Pt was intubated 11/20-11/25. He describes a h/o esophageal  stretching and new onset nasal regurgitation. Type of Study: Modified Barium Swallowing Study Reason for Referral: Objectively evaluate swallowing function Previous Swallow Assessment: BSE 11/16 with suspected GI involvement and ? neurological component, recommended thin liquids pending MBS however pt then declined study; EGD 2009 with dilatation of stricture, esophagram 2007 + reflux @ EG junction, tertiary contractions, prominent cricopharyngeus, barium tablet cleared Diet Prior to this Study: Dysphagia 2 (chopped);Thin liquids Temperature Spikes Noted: No Respiratory Status: Nasal cannula History of Recent Intubation: Yes Length of Intubations (days): 5 days Date extubated: 07/05/14 Behavior/Cognition: Alert;Agitated;Requires cueing Oral Cavity - Dentition: Adequate natural dentition Self-Feeding Abilities: Needs assist Patient Positioning: Upright in chair Baseline Vocal Quality: Low vocal intensity Anatomy: Within functional limits Pharyngeal Secretions: Not observed secondary MBS    Reason for Referral Objectively evaluate swallowing function   Oral Phase Oral Preparation/Oral Phase Oral Phase: Impaired Oral - Thin Oral - Thin Cup: Reduced posterior propulsion;Delayed oral transit Oral - Thin Straw: Within functional limits Oral - Solids Oral - Puree: Delayed oral transit;Lingual/palatal residue Oral - Regular: Delayed oral transit;Lingual/palatal residue   Pharyngeal Phase Pharyngeal Phase Pharyngeal Phase: Impaired Pharyngeal - Thin Pharyngeal - Thin Cup: Penetration/Aspiration before swallow;Delayed swallow initiation;Premature spillage to valleculae Penetration/Aspiration details (thin cup): Material enters airway, remains ABOVE vocal cords then ejected out Pharyngeal - Thin Straw: Within functional limits Pharyngeal - Solids Pharyngeal - Puree: Within functional limits Pharyngeal - Regular: Within functional limits  Cervical Esophageal Phase    GO    Cervical  Esophageal Phase Cervical Esophageal Phase: Frank Junker, MA, CCC-SLP 07/07/2014, 3:01 PM

## 2014-07-07 NOTE — Progress Notes (Signed)
UR COMPLETED  

## 2014-07-07 NOTE — Progress Notes (Signed)
Speech Language Pathology Treatment: Dysphagia  Patient Details Name: Frank [name] MRN: [id number] DOB: [birth year] Today's Date: 07/07/2014 Time: 8921-1941 SLP Time Calculation (min) (ACUTE ONLY): 25 min  Assessment / Plan / Recommendation Clinical Impression  Dysphagia tx provided today for observation of diet tolerance, compensatory strategy training, and pt education. The pt demonstrated overt s/s of aspiration at bedside- delayed throat clear with about 50% of thin liquid/ puree trials. Incidence of this was eliminated when provided smaller boluses of puree but continued with thin liquids. Pt continues to have suspected delayed swallow initiation and reduced hyolaryngeal excursion, putting pt at increased risk of aspiration. Recommend MBS to take place later today to objectively evaluate swallow function and to identify safest diet/ strategies.    HPI HPI: 58 yo WM with progressive ascending neuromuscular weakness over several months who presents with inability to walk, dysarthria, profound weakness and impending respiratory failure. 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 Pt was intubated 11/20-11/25. He describes a h/o esophageal stretching and new onset nasal regurgitation.   Pertinent Vitals Pain Assessment: No/denies pain  SLP Plan  MBS    Recommendations Diet recommendations: Dysphagia 2 (fine chop);Thin liquid Liquids provided via: Cup;Straw Medication Administration: Crushed with puree Supervision: Patient able to self feed;Full supervision/cueing for compensatory strategies Compensations: Slow rate;Small sips/bites Postural Changes and/or Swallow Maneuvers: Seated upright 90 degrees;Upright 30-60 min after meal              Oral Care Recommendations: Oral care BID Follow up Recommendations: Inpatient Rehab Plan: MBS    GO     Kern Reap, MA, CCC-SLP 07/07/2014, 9:42 AM

## 2014-07-07 NOTE — Clinical Social Work Note (Signed)
Patient discussed in progression this morning. Per RN and SNF, patient is from [place] and does not wish to return. Clinical Social Worker noted patient has been accepted into CIR and now awaiting insurance approval.   CSW will follow for disposition.   Glendon Axe, MSW, LCSWA [phone] 07/07/2014 2:55 PM

## 2014-07-07 NOTE — Progress Notes (Signed)
NIF= -44 x 2 VC=  2.7L x 2

## 2014-07-07 NOTE — Progress Notes (Signed)
Rehab admissions - Evaluated for possible admission.  Awaiting OT consult prior to submitting clinicals to [facility] on Monday.  Patient currently in HD for plasma exchange.  We will need authorization from [facility] prior to inpatient rehab admission.  I will follow up with patient on Monday.  Call me for questions.  #[phone]

## 2014-07-07 NOTE — Plan of Care (Signed)
Problem: SLP Dysphagia Goals Goal: Patient will utilize recommended strategies Patient will utilize recommended strategies during swallow to increase swallowing safety with  Outcome: Not Progressing Increased s/s of aspiration today. Recommend MBS.

## 2014-07-07 NOTE — Progress Notes (Signed)
PULMONARY / CRITICAL CARE MEDICINE   Name: Frank [name] MRN: [id number] DOB: 1955/11/27    ADMISSION DATE:  06/30/2014 CONSULTATION DATE:  11/20  REFERRING MD :  Neuro  CHIEF COMPLAINT:  Can't walk  INITIAL PRESENTATION:  58 yo WM with progressive ascending neuromuscular weakness over several months who presents with inability to walk, dysarthria, profound weakness and impending respiratory failure. 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  Neurology has requested PCCM to placed HD catheter for plasma exchange and he needs intubation for airway protection.   STUDIES:  11/20 Head CT >> no acute intracranial abnormalities, degenerative changes throughout cervical spine  SIGNIFICANT EVENTS: 11/11 - 11/18:  Admitted for N, V, HCAP, Dysphagia, Chronic back pain, Urinary retention and generalized weakness 11/20:  Intubated for progressive weakness/ascending paralysis and plasmapheresis started. 11/27 PLEX 5/5  SUBJECTIVE:  Very anxious - had an attack during PLEX 11/27  VITAL SIGNS: Temp:  [97.9 F (36.6 C)-98.2 F (36.8 C)] 97.9 F (36.6 C) (11/23 0000) Pulse Rate:  [76-107] 83 (11/23 0700) Resp:  [9-33] 19 (11/23 0700) BP: (90-178)/(44-95) 150/95 mmHg (11/23 0700) SpO2:  [93 %-99 %] 97 % (11/23 0700) FiO2 (%):  [40 %] 40 % (11/23 0343) Weight:  [188 lb (85.276 kg)-188 lb 11.4 oz (85.6 kg)] 188 lb 11.4 oz (85.6 kg) (11/23 0600) HEMODYNAMICS: CV stable    PHYSICAL EXAMINATION: General: awake and alert Neuro:  Improved strength.  5/5 power all 4Es HEENT:  No JVD /LAN Cardiovascular:  HSR RRR Lungs: coarse mild  Abdomen:  +bs, no r/g Musculoskeletal:  Some edema  Skin:  warm  All LABS reviewed .  Imaging No film  ASSESSMENT / PLAN:  NEUROLOGIC A:   Progressive paralysis -  GBS  (per neuro)-improving with plasma exchange,NIf improving VC better Severe anxiety P:   Plasma exchange, completed x5.   Ativan 1 q 4h prn, resume cymbalta & stimulant when able to take po - watch for stimulant  withdrawal   PULMONARY OETT 11/20>>11/25 A: VDRF resolved  P:   Flutter valve Nif/ vc daily   CARDIOVASCULAR CVL 11/20 rt IJ HD cath>> At risk autonomic  dysfxn A:  Hx of htn P:  Hold antihypertensives given fluctuant SBP. tele  RENAL A:  hypokalemia Edema P:   BMET in AM  Replete lytes  GASTROINTESTINAL A: obstipation, dysphagia P:   Bowel program SLP recomm MBS  HEMATOLOGIC A:   Anemia, dilutional? P:  lovenox noted  Transfuse per ICU protocol   ENDOCRINE A:   Hyperglycemia  P:   SSI  Summary - accepted to CIR, awaiting insurance approval, current issues are severe anxiety, dysphagia & deconditioning, neuro following  Transfer to [facility] 11/28 am  [facility] V.MD  07/07/2014 2:06 PM

## 2014-07-07 NOTE — Progress Notes (Signed)
NIF-40, VC 1.8L 

## 2014-07-07 NOTE — Progress Notes (Signed)
PT Cancellation Note  Patient Details Name: Frank [name] MRN: [id number] DOB: 1956-06-02   Cancelled Treatment:    Reason Eval/Treat Not Completed: Patient at procedure or test/unavailable. Attempting to see patient however techs up to the room to take patient to plasma exchange. Will follow up as appropriate   Robinette, Tonia Brooms 07/07/2014, 9:45 AM

## 2014-07-07 NOTE — Consult Note (Signed)
Physical Medicine and Rehabilitation Consult   Reason for Consult: Ascending weakness due to GBS Referring Physician: Dr. Titus Mould.    HPI: Frank [name] is a 58 y.o. male with history of DM type 2 with peripheral neuropathy, anxiety disorder, left ankle fracture 02/2014,  back and shoulder pain with mild lower extremity weakness since Oct 1st. He was evaluated by ED multiple times diagnosis of PNA as well as urinary retention, N/V with decrease in intake. MRI thoracic spine with left paracentral disc extrusion at T7-8 with mild cord flattening but no abnormal cord signal or compression deformity and underwent esophageal dilatation and was discharged to SNF on 11/18. He was readmitted on 06/30/14 with rapidly ascending paralysis and severe pain. 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 He was started on IVIG and intubated due to concerns of impending respiratory failure. He tolerated extubation on 11/25 and respiratory status stable. Swallow evaluation done and patient started on dysphagia 2, thin liquids due to generalized weakness. He has had issues with anxiety as well as paranoia. Therapy evaluations done on 11/25 and CIR recommended for follow up therapy.   Patient just received IV fentanyl and Ativan for anxiety and low back pain Review of Systems  HENT: Negative.   Eyes: Negative.   Respiratory: Negative.   Cardiovascular: Negative.   Gastrointestinal: Negative.   Genitourinary: Negative.   Musculoskeletal: Positive for back pain.  Skin: Negative.   Neurological: Positive for sensory change and weakness.  Endo/Heme/Allergies: Negative.   Psychiatric/Behavioral: Positive for memory loss.   Past Medical History  Diagnosis Date  . COLONIC POLYPS 02/01/2008  . DIABETES MELLITUS, TYPE II 05/20/2010  .  HYPERLIPIDEMIA 10/09/2008  . ANXIETY DEPRESSION 02/01/2008  . ERECTILE DYSFUNCTION 10/09/2008  . ADD 10/09/2008  . SLEEP APNEA, OBSTRUCTIVE 02/01/2008  . MORTON'S NEUROMA 05/20/2010  . PERIPHERAL NEUROPATHY 05/20/2010  . Other specified forms of hearing loss 06/27/2009  . HYPERTENSION 10/09/2008  . HEMORRHOIDS 02/01/2008  . ALLERGIC RHINITIS 10/09/2008  . Stricture and stenosis of esophagus 02/02/2008  . GERD 02/01/2008  . HIATAL HERNIA 02/01/2008  . ERECTILE DYSFUNCTION, ORGANIC 05/20/2010  . WRIST PAIN, LEFT 12/05/2009  . FOOT PAIN, LEFT 05/20/2010  . PERIPHERAL EDEMA 05/20/2010  . DYSPNEA 03/12/2010  . Abdominal pain, unspecified site 01/19/2009  . Abdominal pain, left lower quadrant 06/06/2010  . Type II or unspecified type diabetes mellitus without mention of complication, uncontrolled 11/14/2010   Past Surgical History  Procedure Laterality Date  . Carpal tunnel release    . Rotator cuff repair    . Esophageal dilation  july 2009  . Eye surgery    . Esophagogastroduodenoscopy N/A 06/27/2014    Procedure: ESOPHAGOGASTRODUODENOSCOPY (EGD);  Surgeon: Lafayette Dragon, MD;  Location: [facility];  Service: Endoscopy;  Laterality: N/A;   Family History  Problem Relation Age of Onset  . Diabetes Mother   . Heart disease Mother   . Hyperlipidemia Mother   . Depression Mother   . Diabetes Brother   . Colon cancer Neg Hx    Social History:  reports that he has never smoked. He has never used smokeless tobacco. He reports that he drinks alcohol. He reports that he uses illicit drugs (Methaqualone). Allergies:  Allergies  Allergen Reactions  . Codeine Itching    REACTION: Itching  . Metformin And Related Other (  See Comments)    GI upset, diarrhea   Medications Prior to Admission  Medication Sig Dispense Refill  . allopurinol (ZYLOPRIM) 100 MG tablet Take 2 tablets (200 mg total) by mouth daily. 30 tablet 6  . amLODipine (NORVASC) 10 MG tablet Take 1 tablet (10 mg total) by mouth daily. 90  tablet 3  . atorvastatin (LIPITOR) 10 MG tablet Take 10 mg by mouth daily.    . clonazePAM (KLONOPIN) 0.5 MG tablet Take 1 tablet (0.5 mg total) by mouth 2 (two) times daily as needed for anxiety. 30 tablet 0  . colchicine 0.6 MG tablet Take 1 tablet (0.6 mg total) by mouth 2 (two) times daily. 10 tablet 0  . DULoxetine (CYMBALTA) 30 MG capsule Take 30 mg by mouth daily.     Marland Kitchen glipiZIDE (GLUCOTROL XL) 2.5 MG 24 hr tablet Take 1 tablet (2.5 mg total) by mouth daily with breakfast. 90 tablet 3  . hydrALAZINE (APRESOLINE) 10 MG tablet Take 1 tablet (10 mg total) by mouth 3 (three) times daily. 90 tablet 1  . levofloxacin (LEVAQUIN) 500 MG tablet Take 1 tablet (500 mg total) by mouth daily. 5 tablet 0  . meloxicam (MOBIC) 15 MG tablet Take 1 tablet (15 mg total) by mouth daily. 30 tablet 0  . methylphenidate 54 MG PO CR tablet Take 54 mg by mouth 2 (two) times daily.    . metoCLOPramide (REGLAN) 5 MG tablet Take 1 tablet (5 mg total) by mouth 4 (four) times daily -  before meals and at bedtime. 90 tablet 1  . NUCYNTA 100 MG TABS Take 1 tablet by mouth 3 (three) times daily.     Marland Kitchen oxyCODONE-acetaminophen (PERCOCET) 7.5-325 MG per tablet Take 1 tablet by mouth every 6 (six) hours as needed for pain. 20 tablet 0  . pantoprazole (PROTONIX) 40 MG tablet Take 1 tablet (40 mg total) by mouth daily. 30 tablet 0  . pregabalin (LYRICA) 200 MG capsule Take 200 mg by mouth 2 (two) times daily.    Marland Kitchen glucose blood (FREESTYLE TEST STRIPS) test strip Use as instructed 100 each 12  . ondansetron (ZOFRAN) 4 MG tablet Take 1 tablet (4 mg total) by mouth 2 (two) times daily. (Patient not taking: Reported on 06/30/2014) 60 tablet 1  . ondansetron (ZOFRAN) 4 MG tablet Take 1 tablet (4 mg total) by mouth every 8 (eight) hours as needed for nausea or vomiting. (Patient not taking: Reported on 06/30/2014) 90 tablet 0    Home: Home Living Family/patient expects to be discharged to:: Inpatient rehab Living Arrangements:  Spouse/significant other  Functional History: Prior Function Comments: Unclear true baseline as pt has been having progressive weakness and increasing frequency of falls per chart.   Functional Status:  Mobility: Bed Mobility Overal bed mobility: Needs Assistance, +2 for physical assistance Bed Mobility: Supine to Sit Supine to sit: Mod assist, +2 for physical assistance, HOB elevated General bed mobility comments: pt able to initiate movement in LEs, but requires A for directing movement and for bringing trunk up to sitting.   Transfers Overall transfer level: Needs assistance Equipment used: 2 person hand held assist Transfers: Sit to/from Stand, Stand Pivot Transfers Sit to Stand: Max assist, +2 physical assistance Stand pivot transfers: Total assist, +2 physical assistance General transfer comment: pt able to come to stand, but seems to get anxious once standing.  Repeated coming to stand x2.  pt needs facilitation for positioning of LEs and blocking feet prior to coming to stand and  then increased A needed to complete pivot to recliner.        ADL:    Cognition: Cognition Overall Cognitive Status: Impaired/Different from baseline Orientation Level: Oriented X4 Cognition Arousal/Alertness: Awake/alert Behavior During Therapy: Flat affect Overall Cognitive Status: Impaired/Different from baseline Area of Impairment: Orientation, Attention, Memory, Following commands, Safety/judgement, Awareness, Problem solving Orientation Level: Disoriented to, Situation (pt appropriately found calendar in room for day and date.  ) Current Attention Level: Focused Memory: Decreased short-term memory Following Commands: Follows one step commands inconsistently Safety/Judgement: Decreased awareness of safety, Decreased awareness of deficits Awareness: Intellectual Problem Solving: Slow processing, Decreased initiation, Difficulty sequencing, Requires verbal cues, Requires tactile  cues General Comments: pt makes off topic comments occasionally and needs cueing to re-orient to task.  pt indicating being "afraid of the light" when PT mentions turning on a light, but denies being sensitive to the light.  No family present to A with determining baseline cognition.    Blood pressure 149/72, pulse 93, temperature 98.1 F (36.7 C), temperature source Oral, resp. rate 22, height 5\' 9"  (1.753 m), weight 80.332 kg (177 lb 1.6 oz), SpO2 100 %. Physical Exam  Nursing note and vitals reviewed. Constitutional: He is oriented to person, place, and time. He appears well-developed and well-nourished.  HENT:  Head: Normocephalic and atraumatic.  Eyes: Conjunctivae and EOM are normal. Pupils are equal, round, and reactive to light.  Neck: Normal range of motion.  Cardiovascular: Normal rate, regular rhythm and normal heart sounds.   Respiratory: Effort normal and breath sounds normal.  GI: Soft. Bowel sounds are normal.  Musculoskeletal:  Normal passive range of motion, active range of motion reduced by weakness  Neurological: He is alert and oriented to person, place, and time. No cranial nerve deficit. Coordination abnormal.  Reflex Scores:      Patellar reflexes are 0 on the right side and 0 on the left side.      Achilles reflexes are 0 on the right side and 0 on the left side. Psychiatric: He has a normal mood and affect.  Motor: 3 minus bilateral deltoid 4 minus biceps triceps and grip 3 minus bilateral hip flexors 3 minus knee extensors 3 minus ankle dorsiflexor plantar flexor Cerebellar: Ataxia finger-nose-finger moderate bilateral upper extremities Heel-to-shin intact but done very slowly Sensation reduced to light touch and proprioception lower extremities greater than upper extremities  Results for orders placed or performed during the hospital encounter of 06/30/14 (from the past 24 hour(s))  Glucose, capillary     Status: Abnormal   Collection Time: 07/06/14 12:17 PM   Result Value Ref Range   Glucose-Capillary 273 (H) 70 - 99 mg/dL  Basic metabolic panel     Status: Abnormal   Collection Time: 07/07/14  4:14 AM  Result Value Ref Range   Sodium 139 137 - 147 mEq/L   Potassium 3.6 (L) 3.7 - 5.3 mEq/L   Chloride 97 96 - 112 mEq/L   CO2 27 19 - 32 mEq/L   Glucose, Bld 152 (H) 70 - 99 mg/dL   BUN 31 (H) 6 - 23 mg/dL   Creatinine, Ser 1.04 0.50 - 1.35 mg/dL   Calcium 9.9 8.4 - 10.5 mg/dL   GFR calc non Af Amer 76 (L) >90 mL/min   GFR calc Af Amer 88 (L) >90 mL/min   Anion gap 15 5 - 15  CBC     Status: Abnormal   Collection Time: 07/07/14  4:14 AM  Result Value Ref Range  WBC 9.1 4.0 - 10.5 K/uL   RBC 4.24 4.22 - 5.81 MIL/uL   Hemoglobin 13.0 13.0 - 17.0 g/dL   HCT 37.5 (L) 39.0 - 52.0 %   MCV 88.4 78.0 - 100.0 fL   MCH 30.7 26.0 - 34.0 pg   MCHC 34.7 30.0 - 36.0 g/dL   RDW 12.8 11.5 - 15.5 %   Platelets 336 150 - 400 K/uL   No results found.  Assessment/Plan: Diagnosis: Guillain Barr syndrome With tetra paresis as well as sensory deficits 1. Does the need for close, 24 hr/day medical supervision in concert with the patient's rehab needs make it unreasonable for this patient to be served in a less intensive setting? Yes 2. Co-Morbidities requiring supervision/potential complications: Thoracic disc with chronic mid back pain. 3. Due to bladder management, bowel management, safety, skin/wound care, disease management, medication administration, pain management and patient education, does the patient require 24 hr/day rehab nursing? Yes 4. Does the patient require coordinated care of a physician, rehab nurse, PT (1-2 hrs/day, 5 days/week) and OT (11-2 hrs/day, 5 days/week) to address physical and functional deficits in the context of the above medical diagnosis(es)? Yes Addressing deficits in the following areas: balance, endurance, locomotion, strength, transferring, bowel/bladder control, bathing, dressing, feeding, grooming and toileting 5. Can  the patient actively participate in an intensive therapy program of at least 3 hrs of therapy per day at least 5 days per week? Yes 6. The potential for patient to make measurable gains while on inpatient rehab is good 7. Anticipated functional outcomes upon discharge from inpatient rehab are min assist  with PT, min assist with OT, n/a with SLP. 8. Estimated rehab length of stay to reach the above functional goals is: 20-24 days 9. Does the patient have adequate social supports and living environment to accommodate these discharge functional goals? Yes 10. Anticipated D/C setting: Home 11. Anticipated post D/C treatments: [facility] therapy 12. Overall Rehab/Functional Prognosis: good  RECOMMENDATIONS: This patient's condition is appropriate for continued rehabilitative care in the following setting: CIR Patient has agreed to participate in recommended program. Potentially Note that insurance prior authorization may be required for reimbursement for recommended care.  Comment: Uncertain whether patient has 24 7 caregiver, Was in a skilled nursing facility for 3 days prior to admission had been living at home prior to that time Need to get patient off IV pain medication as well as IV anxiolytics    07/07/2014

## 2014-07-08 LAB — GLUCOSE, CAPILLARY
GLUCOSE-CAPILLARY: 125 mg/dL — AB (ref 70–99)
GLUCOSE-CAPILLARY: 213 mg/dL — AB (ref 70–99)
GLUCOSE-CAPILLARY: 188 mg/dL — AB (ref 70–99)
Glucose-Capillary: 116 mg/dL — ABNORMAL HIGH (ref 70–99)

## 2014-07-08 LAB — PORPHOBILINOGEN, RANDOM URINE: QUANTITATIVE PORPHOBILINOGEN: 0 mg/g{creat} (ref <2.0)

## 2014-07-08 MED ORDER — POTASSIUM CHLORIDE CRYS ER 20 MEQ PO TBCR
40.0000 meq | EXTENDED_RELEASE_TABLET | Freq: Four times a day (QID) | ORAL | Status: AC
  Administered 2014-07-08 (×2): 40 meq via ORAL
  Filled 2014-07-08 (×2): qty 2

## 2014-07-08 MED ORDER — POTASSIUM CHLORIDE 20 MEQ PO PACK: 40.0000 meq | PACK | Freq: Four times a day (QID) | ORAL | Status: DC

## 2014-07-08 MED ORDER — INSULIN ASPART 100 UNIT/ML ~~LOC~~ SOLN
0.0000 [IU] | Freq: Three times a day (TID) | SUBCUTANEOUS | Status: DC
  Administered 2014-07-08 – 2014-07-09 (×2): 3 [IU] via SUBCUTANEOUS
  Administered 2014-07-11 (×2): 1 [IU] via SUBCUTANEOUS

## 2014-07-08 NOTE — Progress Notes (Signed)
Pt. Performed >-60 on the NIF and 2.8L on the VC.

## 2014-07-08 NOTE — Evaluation (Signed)
Occupational Therapy Evaluation Patient Details Name: Frank [name] MRN: [id number] DOB: Mar 13, 1956 Today's Date: 07/08/2014    History of Present Illness pt presents with GBS.  pt has been treated with Plasma Exchange and was intubated 11/20- 11/25.     Clinical Impression   Pt demonstrates decreased sitting balance at EOB for ADL as he tends to lean posteriorly at times. He demonstrates ataxic movements with UEs which interfere with ability to perform fine motor ADL tasks. He reports feeling like he is holding an object in his hand even though no object is present in either hand. He will benefit from additional OT to progress safety and independence with self care tasks. Will follow.     Follow Up Recommendations  CIR;Supervision/Assistance - 24 hour    Equipment Recommendations  3 in 1 bedside comode    Recommendations for Other Services       Precautions / Restrictions Precautions Precautions: Fall Precaution Comments: Chronic back pain per previous admit in [place].   Restrictions Weight Bearing Restrictions: No      Mobility Bed Mobility Overal bed mobility: Needs Assistance Bed Mobility: Supine to Sit;Sit to Supine     Supine to sit: Mod assist;HOB elevated Sit to supine: Min assist   General bed mobility comments: use of rails. Pt able to bring LEs back onto bed himself with min assist to guide trunk to supine safely due to ataxic movements. Mod assist to bring trunk from supine to sit.   Transfers                      Balance       Sitting balance - Comments: min guard to min assist for balance EOB to groom and self feed.                                    ADL Overall ADL's : Needs assistance/impaired Eating/Feeding: Moderate assistance;Sitting Eating/Feeding Details (indicate cue type and reason): sitting EOB. Assist to help scoop pudding onto spoon and control for ataxic movements at times. Pt encouraged to stabilize R UE against  body for better control but pt had trouble doing so. Use tray table and L elbow propped on table seated at EOB. May benefit from built up handles.  Grooming: Wash/dry face;Sitting;Moderate assistance Grooming Details (indicate cue type and reason): intermittently for sitting balance and to help with control of washcloth over face so he didnt rub his eye too hard and for thoroughness.  Upper Body Bathing: Maximal assistance;Sitting   Lower Body Bathing: Maximal assistance;Bed level   Upper Body Dressing : Moderate assistance;Bed level   Lower Body Dressing: Total assistance;Bed level Lower Body Dressing Details (indicate cue type and reason): with socks.               General ADL Comments: Pt able to sit at EOB for approximately 15 minutes with min guard to min assist for balance. tends to lean posteriorly and needs min assist to correct. Pt ate several bites of pudding and washed face at EOB. Worked on trunk control and posture as pt also tends to flex at neck and trunk with activity.      Vision                     Perception     Praxis      Pertinent Vitals/Pain Pain Assessment: Faces Faces Pain Scale:  Hurts a little bit Pain Location: back  Pain Descriptors / Indicators: Sore Pain Intervention(s): Repositioned     Hand Dominance     Extremity/Trunk Assessment Upper Extremity Assessment Upper Extremity Assessment: RUE deficits/detail;LUE deficits/detail RUE Deficits / Details: strength grossly 4/5 throughout RUE Coordination: decreased fine motor;decreased gross motor LUE Deficits / Details: strength grossly 4/5 throughout. LUE Coordination: decreased fine motor;decreased gross motor           Communication Communication Communication: No difficulties   Cognition Arousal/Alertness: Awake/alert Behavior During Therapy: Flat affect Overall Cognitive Status: Impaired/Different from baseline Area of Impairment: Memory;Safety/judgement     Memory:  Decreased short-term memory Following Commands: Follows one step commands with increased time Safety/Judgement: Decreased awareness of safety;Decreased awareness of deficits   Problem Solving: Slow processing;Requires verbal cues;Requires tactile cues General Comments: pt tending to perseverate on washing his face and clearing objects off his tray table. Needs cues to stay focused on task and for completion of task in order to start another task.    General Comments       Exercises       Shoulder Instructions      Home Living Family/patient expects to be discharged to:: Inpatient rehab                                        Prior Functioning/Environment Level of Independence: Needs assistance    ADL's / Homemaking Assistance Needed: unsure of exact baseline. Pt was at SNF PTA and he states he was working with therapy.        OT Diagnosis: Generalized weakness   OT Problem List: Decreased strength;Decreased knowledge of use of DME or AE;Impaired balance (sitting and/or standing);Decreased cognition;Decreased safety awareness   OT Treatment/Interventions: Self-care/ADL training;Patient/family education;Therapeutic activities;DME and/or AE instruction    OT Goals(Current goals can be found in the care plan section) Acute Rehab OT Goals Patient Stated Goal: pt wants to get up OOB OT Goal Formulation: With patient Time For Goal Achievement: 07/22/14 Potential to Achieve Goals: Good  OT Frequency: Min 2X/week   Barriers to D/C:            Co-evaluation              End of Session    Activity Tolerance: Patient tolerated treatment well Patient left: in bed;with call bell/phone within reach;with nursing/sitter in room   Time: 1610-9604 OT Time Calculation (min): 38 min Charges:  OT General Charges $OT Visit: 1 Procedure OT Evaluation $Initial OT Evaluation Tier I: 1 Procedure OT Treatments $Self Care/Home Management : 8-22 mins $Therapeutic  Activity: 8-22 mins G-Codes:    Jules Schick  [phone] 07/08/2014, 10:39 AM
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
## 2014-07-08 NOTE — Progress Notes (Signed)
NIF of -40, VC of 2.5L.  Good patient effort.

## 2014-07-08 NOTE — Progress Notes (Signed)
[facility] PROGRESS NOTE   Frank [name] IAX:[id number] DOB: August 15, 1955 DOA: 06/30/2014 PCP: Cathlean Cower, MD  Patient was in the PCCM till 07/07/14, transferred to hospitalist care on 11/28  HPI/Subjective: Denies any complaints, seen while respiratory therapist checking his, NIF which was -40.  Assessment/Plan: Principal Problem:   Acute respiratory failure with hypoxia Active Problems:   Rapidly progressive weakness   Ascending paralysis   Protein-calorie malnutrition, severe   Acute respiratory acidosis   History of ETT   Pain   Acute respiratory failure   Encounter for orogastric (OG) tube placement    Acute respiratory failure with hypoxia After admission to the hospital patient intubated and mechanically ventilated secondary to progressive weakness. Been extubated since 11/25.  Currently doing okay on 2 L of oxygen, very reasonable lung mechanics (NIF and VC).  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 Plasma exchange was done 5 Patient weakness as well as lung mechanics improved.  Dysphagia Seen by speech-language pathology and modified barium swallow study done Recommended dysphagia 3 with thin liquids.  Severe protein calorie malnutrition Seen by dietitian.  Anxiety Patient did have severe anxiety especially with him not able to breathe efficiently. On Ativan IV as needed.  Code Status: Full code Family Communication: Plan discussed with the patient. Disposition Plan: Remains inpatient, Await CIR insurance approval. Likely Monday.   Consultants:  None  Procedures:  None  Antibiotics:  None   Objective: Filed Vitals:   07/08/14 0611  BP: 135/86  Pulse: 98  Temp: 97.6 F (36.4 C)  Resp: 18    Intake/Output Summary (Last 24 hours) at 07/08/14 1126 Last data filed at 07/08/14 0653  Gross per 24 hour  Intake    120 ml  Output    400 ml  Net    -280 ml   Filed Weights   07/06/14 0349 07/06/14 1259 07/08/14 0220  Weight: 81.8 kg (180 lb 5.4 oz) 80.332 kg (177 lb 1.6 oz) 81.364 kg (179 lb 6 oz)    Exam: General: Alert and awake, oriented x3, not in any acute distress. HEENT: anicteric sclera, pupils reactive to light and accommodation, EOMI CVS: S1-S2 clear, no murmur rubs or gallops Chest: clear to auscultation bilaterally, no wheezing, rales or rhonchi Abdomen: soft nontender, nondistended, normal bowel sounds, no organomegaly Extremities: no cyanosis, clubbing or edema noted bilaterally Neuro: Cranial nerves II-XII intact, no focal neurological deficits  Data Reviewed: Basic Metabolic Panel:  Recent Labs Lab 07/02/14 0244 07/03/14 0530  07/04/14 0520 07/05/14 0520 07/06/14 0355 07/07/14 0414 07/07/14 1027  NA 145 145  < > 145 140 142 139 138  K 4.1 3.6*  < > 3.6* 3.5* 3.6* 3.6* 3.4*  CL 107 104  < > 107 100 98 97 93*  CO2 25 29  --  27 30 30 27   --   GLUCOSE 152* 136*  < > 145* 116* 128* 152* 180*  BUN 30* 33*  < > 28* 23 21 31* 31*  CREATININE 0.74 0.81  < > 0.73 0.67 0.79 1.04 1.00  CALCIUM 9.4 9.2  --  9.0 9.0 9.8 9.9  --   MG 2.0 2.4  --   --   --   --   --   --   PHOS 3.1 2.6  --   --   --   --   --   --   < > = values in this interval not displayed. Liver Function  Tests:  Recent Labs Lab 07/04/14 0520 07/06/14 0355  AST 31 25  ALT 45 33  ALKPHOS 51 39  BILITOT 0.5 1.0  PROT 5.5* 6.5  ALBUMIN 3.6 4.6   No results for input(s): LIPASE, AMYLASE in the last 168 hours. No results for input(s): AMMONIA in the last 168 hours. CBC:  Recent Labs Lab 07/02/14 0244 07/03/14 0530 07/03/14 0909 07/04/14 0520 07/05/14 0520 07/07/14 0414 07/07/14 1027  WBC 14.2* 13.6*  --  10.9* 9.1 9.1  --   NEUTROABS  --   --   --  7.5  --   --   --   HGB 13.2 11.2* 11.6* 11.1* 10.4* 13.0 12.9*  HCT 38.7* 32.9* 34.0* 32.5* 30.7* 37.5* 38.0*  MCV 90.6 88.9  --  91.3 88.7 88.4  --   PLT 195 196  --  199 197 336   --    Cardiac Enzymes: No results for input(s): CKTOTAL, CKMB, CKMBINDEX, TROPONINI in the last 168 hours. BNP (last 3 results) No results for input(s): PROBNP in the last 8760 hours. CBG:  Recent Labs Lab 07/06/14 0407 07/06/14 0754 07/06/14 1217 07/08/14 0650 07/08/14 1116  GLUCAP 123* 117* 273* 125* 213*    Micro Recent Results (from the past 240 hour(s))  Urine culture     Status: None   Collection Time: 06/29/14  7:05 AM  Result Value Ref Range Status   Specimen Description URINE, CATHETERIZED  Final   Special Requests NONE  Final   Culture  Setup Time   Final    06/29/2014 13:32 Performed at [facility]   Final    25,000 COLONIES/ML Performed at [facility]    Culture   Final    STAPHYLOCOCCUS SPECIES (COAGULASE NEGATIVE) Note: RIFAMPIN AND GENTAMICIN SHOULD NOT BE USED AS SINGLE DRUGS FOR TREATMENT OF STAPH INFECTIONS. Performed at [facility]    Report Status 07/01/2014 FINAL  Final   Organism ID, Bacteria STAPHYLOCOCCUS SPECIES (COAGULASE NEGATIVE)  Final      Susceptibility   Staphylococcus species (coagulase negative) - MIC*    GENTAMICIN <=0.5 SENSITIVE Sensitive     LEVOFLOXACIN <=0.12 SENSITIVE Sensitive     NITROFURANTOIN <=16 SENSITIVE Sensitive     OXACILLIN >=4 RESISTANT Resistant     PENICILLIN >=0.5 RESISTANT Resistant     RIFAMPIN <=0.5 SENSITIVE Sensitive     TRIMETH/SULFA 160 RESISTANT Resistant     VANCOMYCIN 2 SENSITIVE Sensitive     TETRACYCLINE 4 SENSITIVE Sensitive     * STAPHYLOCOCCUS SPECIES (COAGULASE NEGATIVE)  CSF culture     Status: None   Collection Time: 06/30/14  3:31 AM  Result Value Ref Range Status   Specimen Description BACK  Final   Special Requests Normal  Final   Gram Stain   Final    NO WBC SEEN NO ORGANISMS SEEN Performed at [facility] Gram Stain Report Called to,Read Back By and Verified With: Gram Stain Report Called to,Read Back By and Verified With:  J.MAYHALL RN @0447  ON 06/30/14 BY SHEA.W CYTOSPIN Performed at [facility]   Final    NO GROWTH 3 DAYS Performed at [facility]    Report Status 07/03/2014 FINAL  Final  Gram stain     Status: None   Collection Time: 06/30/14  3:31 AM  Result Value Ref Range Status   Specimen Description BACK  Final   Special Requests Normal  Final  Gram Stain   Final    NO WBC SEEN NO ORGANISMS SEEN Gram Stain Report Called to,Read Back By and Verified With: J.MAYHALL,RN AT 3382 ON 06/30/14 BY SHEA.W CYTOSPIN    Report Status 06/30/2014 FINAL  Final  MRSA PCR Screening     Status: None   Collection Time: 06/30/14  5:15 AM  Result Value Ref Range Status   MRSA by PCR NEGATIVE NEGATIVE Final    Comment:        The GeneXpert MRSA Assay (FDA approved for NASAL specimens only), is one component of a comprehensive MRSA colonization surveillance program. It is not intended to diagnose MRSA infection nor to guide or monitor treatment for MRSA infections.      Studies: Dg Swallowing Func-speech Pathology  07/07/2014   Amy Dionicia Abler, CCC-SLP     07/07/2014  3:03 PM Objective Swallowing Evaluation: Modified Barium Swallowing Study   Patient Details  Name: Michelangelo Rindfleisch MRN: [id number] Date of Birth: 1955/08/19  Today's Date: 07/07/2014 Time: 4193-7902 SLP Time Calculation (min) (ACUTE ONLY): 23 min  Past Medical History:  Past Medical History  Diagnosis Date  . COLONIC POLYPS 02/01/2008  . DIABETES MELLITUS, TYPE II 05/20/2010  . HYPERLIPIDEMIA 10/09/2008  . ANXIETY DEPRESSION 02/01/2008  . ERECTILE DYSFUNCTION 10/09/2008  . ADD 10/09/2008  . SLEEP APNEA, OBSTRUCTIVE 02/01/2008  . MORTON'S NEUROMA 05/20/2010  . PERIPHERAL NEUROPATHY 05/20/2010  . Other specified forms of hearing loss 06/27/2009  . HYPERTENSION 10/09/2008  . HEMORRHOIDS 02/01/2008  . ALLERGIC RHINITIS 10/09/2008  . Stricture and stenosis of esophagus 02/02/2008  . GERD 02/01/2008  . HIATAL HERNIA 02/01/2008  . ERECTILE  DYSFUNCTION, ORGANIC 05/20/2010  . WRIST PAIN, LEFT 12/05/2009  . FOOT PAIN, LEFT 05/20/2010  . PERIPHERAL EDEMA 05/20/2010  . DYSPNEA 03/12/2010  . Abdominal pain, unspecified site 01/19/2009  . Abdominal pain, left lower quadrant 06/06/2010  . Type II or unspecified type diabetes mellitus without mention  of complication, uncontrolled 11/14/2010   Past Surgical History:  Past Surgical History  Procedure Laterality Date  . Carpal tunnel release    . Rotator cuff repair    . Esophageal dilation  july 2009  . Eye surgery    . Esophagogastroduodenoscopy N/A 06/27/2014    Procedure: ESOPHAGOGASTRODUODENOSCOPY (EGD);  Surgeon: Lafayette Dragon, MD;  Location: [facility];  Service: Endoscopy;   Laterality: N/A;   HPI:  58 yo WM with progressive ascending neuromuscular weakness over  several months who presents with inability to walk, dysarthria,  profound weakness and impending respiratory failure. Presumed  GBS. Pt was intubated 11/20-11/25. He describes a h/o esophageal  stretching and new onset nasal regurgitation.     Assessment / Plan / Recommendation Clinical Impression  Dysphagia Diagnosis: Mild-Moderate oral phase dysphagia Clinical impression: The patient presents with a mild-moderate  oral phase dysphagia characterized by delayed oral transit and  reduced lingual manipulation of bolus by cup, resulting in  premature spillage and penetration of thin liquid which remained  above the vocal folds and was effectively ejected out by the  patient. Use of straw eliminated difficulties with oral phase,  and no further penetration was noted. No difficulties noted with  puree or solid. Given these findings, the pt is at risk of  aspiration when drinking by cup but this risk is significantly  reduced when using straw, thereby avoiding lingual manipulation  and oral transit difficulties. Recommend advancing diet to  dysphagia 3, thin liquids by straw only, meds whole with liquid  by  straw. Continue providing full supervision to  assist with  feeding but allow pt to self-feed as much as possible. Speech  will continue to follow at least x1 to ensure diet tolerance and  review strategies.      Treatment Recommendation  Therapy as outlined in treatment plan below    Diet Recommendation Dysphagia 3 (Mechanical Soft);Thin liquid   Liquid Administration via: Straw Medication Administration: Whole meds with liquid Supervision: Patient able to self feed;Full supervision/cueing  for compensatory strategies Compensations: Slow rate;Small sips/bites Postural Changes and/or Swallow Maneuvers: Seated upright 90  degrees    Other  Recommendations Oral Care Recommendations: Oral care BID   Follow Up Recommendations  Inpatient Rehab    Frequency and Duration min 1 x/week  1 week   Pertinent Vitals/Pain n/a    SLP Swallow Goals     General HPI: 58 yo WM with progressive ascending neuromuscular  weakness over several months who presents with inability to walk,  dysarthria, profound weakness and impending respiratory failure.  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 Pt was intubated 11/20-11/25. He describes a h/o  esophageal stretching and new onset nasal regurgitation. Type of Study: Modified Barium Swallowing Study Reason for Referral: Objectively evaluate swallowing function Previous Swallow Assessment: BSE 11/16 with suspected GI  involvement and ? neurological component, recommended thin  liquids pending MBS however pt then declined study; EGD 2009 with  dilatation of stricture, esophagram 2007 + reflux @ EG junction,  tertiary contractions, prominent cricopharyngeus, barium tablet  cleared Diet Prior to this Study: Dysphagia 2 (chopped);Thin liquids Temperature Spikes Noted: No Respiratory Status: Nasal cannula History of Recent Intubation: Yes Length of Intubations (days): 5 days Date extubated: 07/05/14 Behavior/Cognition: Alert;Agitated;Requires cueing Oral Cavity - Dentition: Adequate natural dentition Self-Feeding Abilities: Needs assist Patient Positioning: Upright in  chair Baseline Vocal Quality: Low vocal intensity Anatomy: Within functional limits Pharyngeal Secretions: Not observed secondary MBS    Reason for Referral Objectively evaluate swallowing function   Oral Phase Oral Preparation/Oral Phase Oral Phase: Impaired Oral - Thin Oral - Thin Cup: Reduced posterior propulsion;Delayed oral  transit Oral - Thin Straw: Within functional limits Oral - Solids Oral - Puree: Delayed oral transit;Lingual/palatal residue Oral - Regular: Delayed oral transit;Lingual/palatal residue   Pharyngeal Phase Pharyngeal Phase Pharyngeal Phase: Impaired Pharyngeal - Thin Pharyngeal - Thin Cup: Penetration/Aspiration before  swallow;Delayed swallow initiation;Premature spillage to  valleculae Penetration/Aspiration details (thin cup): Material enters  airway, remains ABOVE vocal cords then ejected out Pharyngeal - Thin Straw: Within functional limits Pharyngeal - Solids Pharyngeal - Puree: Within functional limits Pharyngeal - Regular: Within functional limits  Cervical Esophageal Phase    GO    Cervical Esophageal Phase Cervical Esophageal Phase: Dorna Mai, Amy K, MA, CCC-SLP 07/07/2014, 3:01 PM     Scheduled Meds: . antiseptic oral rinse  7 mL Mouth Rinse QID  . chlorhexidine  15 mL Mouth/Throat BID  . docusate  100 mg Oral BID  . DULoxetine  30 mg Oral Daily  . enoxaparin (LOVENOX) injection  40 mg Subcutaneous Q24H  . famotidine  20 mg Oral Daily  . polyethylene glycol  17 g Oral Daily  . pregabalin  200 mg Oral BID   Continuous Infusions:      Time spent: 35 minutes    [facility] A  [facility] Pager [phone] If 7PM-7AM, please contact night-coverage at [email or web address], password [email or web address] 07/08/2014, 11:26 AM  LOS: 8 days

## 2014-07-08 NOTE — Progress Notes (Signed)
Pt later called for his medications, same given accordingly except Cymbalta that could not be crushed, pt reassured, will however continue to monitor, sitter at bedside. [place]-Asidi, Geonna Lockyer Efe

## 2014-07-08 NOTE — Progress Notes (Signed)
Pt unable to void.  Bladder scan revealed 579 cc.  Nurse paged MD, Dr. Hartford Poli. Md called back and nurse informed. MD ordered straight cath every 8 hours as needed and to place foley catheter if pt unable to void by morning. Nurse read back and verified and entered orders. Will monitor.  Angeline Slim I 07/08/2014 2:04 PM

## 2014-07-09 LAB — BASIC METABOLIC PANEL
Anion gap: 11 (ref 5–15)
BUN: 22 mg/dL (ref 6–23)
CALCIUM: 9.3 mg/dL (ref 8.4–10.5)
CO2: 26 mEq/L (ref 19–32)
Chloride: 101 mEq/L (ref 96–112)
Creatinine, Ser: 0.89 mg/dL (ref 0.50–1.35)
Glucose, Bld: 112 mg/dL — ABNORMAL HIGH (ref 70–99)
Potassium: 3.8 mEq/L (ref 3.7–5.3)
SODIUM: 138 meq/L (ref 137–147)

## 2014-07-09 LAB — CBC
HCT: 32.1 % — ABNORMAL LOW (ref 39.0–52.0)
Hemoglobin: 11.1 g/dL — ABNORMAL LOW (ref 13.0–17.0)
MCH: 30 pg (ref 26.0–34.0)
MCHC: 34.6 g/dL (ref 30.0–36.0)
MCV: 86.8 fL (ref 78.0–100.0)
PLATELETS: 300 10*3/uL (ref 150–400)
RBC: 3.7 MIL/uL — AB (ref 4.22–5.81)
RDW: 12.8 % (ref 11.5–15.5)
WBC: 7.4 10*3/uL (ref 4.0–10.5)

## 2014-07-09 LAB — GLUCOSE, CAPILLARY
GLUCOSE-CAPILLARY: 111 mg/dL — AB (ref 70–99)
GLUCOSE-CAPILLARY: 234 mg/dL — AB (ref 70–99)
GLUCOSE-CAPILLARY: 104 mg/dL — AB (ref 70–99)
Glucose-Capillary: 130 mg/dL — ABNORMAL HIGH (ref 70–99)

## 2014-07-09 MED ORDER — POTASSIUM CHLORIDE CRYS ER 20 MEQ PO TBCR
40.0000 meq | EXTENDED_RELEASE_TABLET | Freq: Once | ORAL | Status: AC
  Administered 2014-07-09: 40 meq via ORAL
  Filled 2014-07-09: qty 2

## 2014-07-09 NOTE — Progress Notes (Signed)
NIF -58 VC 2.6 L  PT had good effort.

## 2014-07-09 NOTE — Progress Notes (Signed)
IF  -60    VC 3.6L  Pt had good effort

## 2014-07-09 NOTE — Progress Notes (Signed)
[facility] PROGRESS NOTE   Frank [name] ZWC:[id number] DOB: 23-Jan-1956 DOA: 06/30/2014 PCP: Cathlean Cower, MD  Patient was in the PCCM till 07/07/14, transferred to hospitalist care on 11/28  HPI/Subjective: Appears more awake and alert as well as oriented today. Denies any specific complaints.  Assessment/Plan: Principal Problem:   Acute respiratory failure with hypoxia Active Problems:   Rapidly progressive weakness   Ascending paralysis   Protein-calorie malnutrition, severe   Acute respiratory acidosis   History of ETT   Pain   Acute respiratory failure   Encounter for orogastric (OG) tube placement    Acute respiratory failure with hypoxia After admission to the hospital patient intubated and mechanically ventilated secondary to progressive weakness. Been extubated since 11/25.  Currently doing okay on 2 L of oxygen, very reasonable lung mechanics (NIF and VC). Off of antibiotics.  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 Plasma exchange was done 5 Patient weakness as well as lung mechanics improved.  Dysphagia Seen by speech-language pathology and modified barium swallow study done Recommended dysphagia 3 with thin liquids.  Severe protein calorie malnutrition Seen by dietitian.  Anxiety Patient did have severe anxiety especially with him not able to breathe efficiently. On Ativan IV as needed.  Code Status: Full code Family Communication: Plan discussed with the patient. Disposition Plan: Remains inpatient, Await CIR insurance approval. Likely Monday.   Consultants:  None  Procedures:  None  Antibiotics:  None   Objective: Filed Vitals:   07/09/14 0936  BP: 137/72  Pulse: 83  Temp: 98.2 F (36.8 C)  Resp: 16    Intake/Output Summary (Last 24 hours) at 07/09/14 1058 Last data filed at 07/09/14 1014  Gross per 24 hour  Intake   1460 ml    Output   1250 ml  Net    210 ml   Filed Weights   07/06/14 0349 07/06/14 1259 07/08/14 0220  Weight: 81.8 kg (180 lb 5.4 oz) 80.332 kg (177 lb 1.6 oz) 81.364 kg (179 lb 6 oz)    Exam: General: Alert and awake, oriented x3, not in any acute distress. HEENT: anicteric sclera, pupils reactive to light and accommodation, EOMI CVS: S1-S2 clear, no murmur rubs or gallops Chest: clear to auscultation bilaterally, no wheezing, rales or rhonchi Abdomen: soft nontender, nondistended, normal bowel sounds, no organomegaly Extremities: no cyanosis, clubbing or edema noted bilaterally Neuro: Cranial nerves II-XII intact, no focal neurological deficits  Data Reviewed: Basic Metabolic Panel:  Recent Labs Lab 07/03/14 0530  07/04/14 0520 07/05/14 0520 07/06/14 0355 07/07/14 0414 07/07/14 1027 07/09/14 0320  NA 145  < > 145 140 142 139 138 138  K 3.6*  < > 3.6* 3.5* 3.6* 3.6* 3.4* 3.8  CL 104  < > 107 100 98 97 93* 101  CO2 29  --  27 30 30 27   --  26  GLUCOSE 136*  < > 145* 116* 128* 152* 180* 112*  BUN 33*  < > 28* 23 21 31* 31* 22  CREATININE 0.81  < > 0.73 0.67 0.79 1.04 1.00 0.89  CALCIUM 9.2  --  9.0 9.0 9.8 9.9  --  9.3  MG 2.4  --   --   --   --   --   --   --   PHOS 2.6  --   --   --   --   --   --   --   < > =  values in this interval not displayed. Liver Function Tests:  Recent Labs Lab 07/04/14 0520 07/06/14 0355  AST 31 25  ALT 45 33  ALKPHOS 51 39  BILITOT 0.5 1.0  PROT 5.5* 6.5  ALBUMIN 3.6 4.6   No results for input(s): LIPASE, AMYLASE in the last 168 hours. No results for input(s): AMMONIA in the last 168 hours. CBC:  Recent Labs Lab 07/03/14 0530  07/04/14 0520 07/05/14 0520 07/07/14 0414 07/07/14 1027 07/09/14 0320  WBC 13.6*  --  10.9* 9.1 9.1  --  7.4  NEUTROABS  --   --  7.5  --   --   --   --   HGB 11.2*  < > 11.1* 10.4* 13.0 12.9* 11.1*  HCT 32.9*  < > 32.5* 30.7* 37.5* 38.0* 32.1*  MCV 88.9  --  91.3 88.7 88.4  --  86.8  PLT 196  --  199  197 336  --  300  < > = values in this interval not displayed. Cardiac Enzymes: No results for input(s): CKTOTAL, CKMB, CKMBINDEX, TROPONINI in the last 168 hours. BNP (last 3 results) No results for input(s): PROBNP in the last 8760 hours. CBG:  Recent Labs Lab 07/08/14 0650 07/08/14 1116 07/08/14 1628 07/08/14 2134 07/09/14 0642  GLUCAP 125* 213* 116* 188* 111*    Micro Recent Results (from the past 240 hour(s))  CSF culture     Status: None   Collection Time: 06/30/14  3:31 AM  Result Value Ref Range Status   Specimen Description BACK  Final   Special Requests Normal  Final   Gram Stain   Final    NO WBC SEEN NO ORGANISMS SEEN Performed at [facility] Gram Stain Report Called to,Read Back By and Verified With: Gram Stain Report Called to,Read Back By and Verified With: J.MAYHALL RN @0447  ON 06/30/14 BY SHEA.W CYTOSPIN Performed at [employer] [facility]   Final    NO GROWTH 3 DAYS Performed at [facility]    Report Status 07/03/2014 FINAL  Final  Gram stain     Status: None   Collection Time: 06/30/14  3:31 AM  Result Value Ref Range Status   Specimen Description BACK  Final   Special Requests Normal  Final   Gram Stain   Final    NO WBC SEEN NO ORGANISMS SEEN Gram Stain Report Called to,Read Back By and Verified With: J.MAYHALL,RN AT 1694 ON 06/30/14 BY SHEA.W CYTOSPIN    Report Status 06/30/2014 FINAL  Final  MRSA PCR Screening     Status: None   Collection Time: 06/30/14  5:15 AM  Result Value Ref Range Status   MRSA by PCR NEGATIVE NEGATIVE Final    Comment:        The GeneXpert MRSA Assay (FDA approved for NASAL specimens only), is one component of a comprehensive MRSA colonization surveillance program. It is not intended to diagnose MRSA infection nor to guide or monitor treatment for MRSA infections.      Studies: Dg Swallowing Func-speech Pathology  07/07/2014   Amy Dionicia Abler, CCC-SLP     07/07/2014  3:03 PM  Objective Swallowing Evaluation: Modified Barium Swallowing Study   Patient Details  Name: Frank [name] MRN: [id number] Date of Birth: 14-Dec-1955  Today's Date: 07/07/2014 Time: 0349-1791 SLP Time Calculation (min) (ACUTE ONLY): 23 min  Past Medical History:  Past Medical History  Diagnosis Date  . COLONIC POLYPS 02/01/2008  . DIABETES MELLITUS, TYPE II  05/20/2010  . HYPERLIPIDEMIA 10/09/2008  . ANXIETY DEPRESSION 02/01/2008  . ERECTILE DYSFUNCTION 10/09/2008  . ADD 10/09/2008  . SLEEP APNEA, OBSTRUCTIVE 02/01/2008  . MORTON'S NEUROMA 05/20/2010  . PERIPHERAL NEUROPATHY 05/20/2010  . Other specified forms of hearing loss 06/27/2009  . HYPERTENSION 10/09/2008  . HEMORRHOIDS 02/01/2008  . ALLERGIC RHINITIS 10/09/2008  . Stricture and stenosis of esophagus 02/02/2008  . GERD 02/01/2008  . HIATAL HERNIA 02/01/2008  . ERECTILE DYSFUNCTION, ORGANIC 05/20/2010  . WRIST PAIN, LEFT 12/05/2009  . FOOT PAIN, LEFT 05/20/2010  . PERIPHERAL EDEMA 05/20/2010  . DYSPNEA 03/12/2010  . Abdominal pain, unspecified site 01/19/2009  . Abdominal pain, left lower quadrant 06/06/2010  . Type II or unspecified type diabetes mellitus without mention  of complication, uncontrolled 11/14/2010   Past Surgical History:  Past Surgical History  Procedure Laterality Date  . Carpal tunnel release    . Rotator cuff repair    . Esophageal dilation  july 2009  . Eye surgery    . Esophagogastroduodenoscopy N/A 06/27/2014    Procedure: ESOPHAGOGASTRODUODENOSCOPY (EGD);  Surgeon: Lafayette Dragon, MD;  Location: [facility];  Service: Endoscopy;   Laterality: N/A;   HPI:  58 yo WM with progressive ascending neuromuscular weakness over  several months who presents with inability to walk, dysarthria,  profound weakness and impending respiratory failure. Presumed  GBS. Pt was intubated 11/20-11/25. He describes a h/o esophageal  stretching and new onset nasal regurgitation.     Assessment / Plan / Recommendation Clinical Impression  Dysphagia Diagnosis: Mild-Moderate oral phase  dysphagia Clinical impression: The patient presents with a mild-moderate  oral phase dysphagia characterized by delayed oral transit and  reduced lingual manipulation of bolus by cup, resulting in  premature spillage and penetration of thin liquid which remained  above the vocal folds and was effectively ejected out by the  patient. Use of straw eliminated difficulties with oral phase,  and no further penetration was noted. No difficulties noted with  puree or solid. Given these findings, the pt is at risk of  aspiration when drinking by cup but this risk is significantly  reduced when using straw, thereby avoiding lingual manipulation  and oral transit difficulties. Recommend advancing diet to  dysphagia 3, thin liquids by straw only, meds whole with liquid  by straw. Continue providing full supervision to assist with  feeding but allow pt to self-feed as much as possible. Speech  will continue to follow at least x1 to ensure diet tolerance and  review strategies.      Treatment Recommendation  Therapy as outlined in treatment plan below    Diet Recommendation Dysphagia 3 (Mechanical Soft);Thin liquid   Liquid Administration via: Straw Medication Administration: Whole meds with liquid Supervision: Patient able to self feed;Full supervision/cueing  for compensatory strategies Compensations: Slow rate;Small sips/bites Postural Changes and/or Swallow Maneuvers: Seated upright 90  degrees    Other  Recommendations Oral Care Recommendations: Oral care BID   Follow Up Recommendations  Inpatient Rehab    Frequency and Duration min 1 x/week  1 week   Pertinent Vitals/Pain n/a    SLP Swallow Goals     General HPI: 58 yo WM with progressive ascending neuromuscular  weakness over several months who presents with inability to walk,  dysarthria, profound weakness and impending respiratory failure.  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 Pt was intubated 11/20-11/25. He describes a h/o  esophageal stretching and new onset nasal regurgitation. Type of  Study: Modified Barium Swallowing Study Reason for Referral: Objectively  evaluate swallowing function Previous Swallow Assessment: BSE 11/16 with suspected GI  involvement and ? neurological component, recommended thin  liquids pending MBS however pt then declined study; EGD 2009 with  dilatation of stricture, esophagram 2007 + reflux @ EG junction,  tertiary contractions, prominent cricopharyngeus, barium tablet  cleared Diet Prior to this Study: Dysphagia 2 (chopped);Thin liquids Temperature Spikes Noted: No Respiratory Status: Nasal cannula History of Recent Intubation: Yes Length of Intubations (days): 5 days Date extubated: 07/05/14 Behavior/Cognition: Alert;Agitated;Requires cueing Oral Cavity - Dentition: Adequate natural dentition Self-Feeding Abilities: Needs assist Patient Positioning: Upright in chair Baseline Vocal Quality: Low vocal intensity Anatomy: Within functional limits Pharyngeal Secretions: Not observed secondary MBS    Reason for Referral Objectively evaluate swallowing function   Oral Phase Oral Preparation/Oral Phase Oral Phase: Impaired Oral - Thin Oral - Thin Cup: Reduced posterior propulsion;Delayed oral  transit Oral - Thin Straw: Within functional limits Oral - Solids Oral - Puree: Delayed oral transit;Lingual/palatal residue Oral - Regular: Delayed oral transit;Lingual/palatal residue   Pharyngeal Phase Pharyngeal Phase Pharyngeal Phase: Impaired Pharyngeal - Thin Pharyngeal - Thin Cup: Penetration/Aspiration before  swallow;Delayed swallow initiation;Premature spillage to  valleculae Penetration/Aspiration details (thin cup): Material enters  airway, remains ABOVE vocal cords then ejected out Pharyngeal - Thin Straw: Within functional limits Pharyngeal - Solids Pharyngeal - Puree: Within functional limits Pharyngeal - Regular: Within functional limits  Cervical Esophageal Phase    GO    Cervical Esophageal Phase Cervical Esophageal Phase: Dorna Mai, Amy K, MA, CCC-SLP  07/07/2014, 3:01 PM     Scheduled Meds: . antiseptic oral rinse  7 mL Mouth Rinse QID  . chlorhexidine  15 mL Mouth/Throat BID  . docusate  100 mg Oral BID  . DULoxetine  30 mg Oral Daily  . enoxaparin (LOVENOX) injection  40 mg Subcutaneous Q24H  . famotidine  20 mg Oral Daily  . insulin aspart  0-9 Units Subcutaneous TID WC  . polyethylene glycol  17 g Oral Daily  . pregabalin  200 mg Oral BID   Continuous Infusions:      Time spent: 35 minutes    [facility] A  [facility] Pager [phone] If 7PM-7AM, please contact night-coverage at [email or web address], password [facility] 07/09/2014, 10:58 AM  LOS: 9 days

## 2014-07-10 LAB — GLUCOSE, CAPILLARY
Glucose-Capillary: 111 mg/dL — ABNORMAL HIGH (ref 70–99)
Glucose-Capillary: 111 mg/dL — ABNORMAL HIGH (ref 70–99)
Glucose-Capillary: 91 mg/dL (ref 70–99)

## 2014-07-10 LAB — CLOSTRIDIUM DIFFICILE BY PCR: Toxigenic C. Difficile by PCR: POSITIVE — AB

## 2014-07-10 MED ORDER — METRONIDAZOLE 500 MG PO TABS
500.0000 mg | ORAL_TABLET | Freq: Three times a day (TID) | ORAL | Status: DC
  Administered 2014-07-10 – 2014-07-11 (×4): 500 mg via ORAL
  Filled 2014-07-10 (×4): qty 1

## 2014-07-10 NOTE — Progress Notes (Signed)
CARE MANAGEMENT NOTE 07/10/2014  Patient:  Frank [name], Frank [name]   Account Number:  [id number]  Date Initiated:  06/30/2014  Documentation initiated by:  Magdalen Spatz  Subjective/Objective Assessment:   Acute respiratory failure with hypoxia, Ezequiel Ganser Syndrome     Action/Plan:   Anticipated DC Date:  07/07/2014   Anticipated DC Plan:  IP REHAB FACILITY  In-house referral  Clinical Social Worker      DC Planning Services  CM consult      Choice offered to / List presented to:             Status of service:  Completed, signed off Medicare Important Message given?   (If response is "NO", the following Medicare IM given date fields will be blank) Date Medicare IM given:   Medicare IM given by:   Date Additional Medicare IM given:   Additional Medicare IM given by:    Discharge Disposition:  IP REHAB FACILITY  Per UR Regulation:  Reviewed for med. necessity/level of care/duration of stay  If discussed at [place] of Stay Meetings, dates discussed:    Comments:  07/10/2014 1300 Waiting insurance approval for CIR. Jonnie Finner RN CCM Case Mgmt phone [phone]  07/03/2014 Sandi Mariscal, RN BSN [facility] CCM 1014--Pt remains in ICU on vent.  Plasmapheresis treatments started.  Await extubation and acute rehab evals to determine pt's needs for d/c.

## 2014-07-10 NOTE — Progress Notes (Signed)
Speech Language Pathology Treatment: Dysphagia  Patient Details Name: Frank [name] MRN: [id number] DOB: 1956/07/23 Today's Date: 07/10/2014 Time: 2993-7169 SLP Time Calculation (min) (ACUTE ONLY): 13 min  Assessment / Plan / Recommendation Clinical Impression  Skilled treatment session focused on addressing dysphagia goals.  Patient reported that he has been managing soft solids during meal; however, current diet orders indicate that he has been receiving a regular texture diet.  Upon further questioning patient reports that he has been ordering only soft foods on his meal trays.  SLP proceeded with administering regular texture trials with thin liquids via straw which patient consumed minimal amounts of with a delayed throat clear x1.  Patient was able to recall safe swallow precautions and utilize with Supervision cues to go slow with sips via straw.  Patient also educated on how ordering finger foods may better facilitate self-feeding.  Recommend SLP follow up x1 to ensure toleration of regular textures.         HPI HPI: 58 yo WM with progressive ascending neuromuscular weakness over several months who presents with inability to walk, dysarthria, profound weakness and impending respiratory failure. 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 Pt was intubated 11/20-11/25. He describes a h/o esophageal stretching and new onset nasal regurgitation.  Objective swalllow assessment completed 11/17 recommending Dys 3 textures and thin liquids via straw.   Pertinent Vitals Pain Assessment: No/denies pain  SLP Plan  Continue with current plan of care    Recommendations Diet recommendations: Regular;Thin liquid Liquids provided via: Straw Medication Administration: Whole meds with liquid Supervision: Patient able to self feed;Full supervision/cueing for compensatory strategies Compensations: Slow rate;Small sips/bites Postural Changes and/or Swallow Maneuvers: Seated upright 90 degrees              Oral Care  Recommendations: Oral care BID Follow up Recommendations: Inpatient Rehab Plan: Continue with current plan of care    GO    Carmelia Roller., CCC-SLP [phone]  [place] 07/10/2014, 2:43 PM

## 2014-07-10 NOTE — Progress Notes (Signed)
Physical Therapy Treatment Patient Details Name: Frank [name] MRN: [id number] DOB: 02/04/1956 Today's Date: 07/10/2014    History of Present Illness pt presents with GBS.  pt has been treated with Plasma Exchange and was intubated 11/20- 11/25.      PT Comments    Pt very eager to work with therapy. Explained need to not "overdo" his activity. LE strength significantly improved,however coordination remains poor with difficulty advancing/placing feet when stepping.  Follow Up Recommendations  CIR     Equipment Recommendations  None recommended by PT    Recommendations for Other Services       Precautions / Restrictions Precautions Precautions: Fall Precaution Comments: h/o multiple falls    Mobility  Bed Mobility Overal bed mobility: Needs Assistance Bed Mobility: Rolling;Sidelying to Sit Rolling: Supervision Sidelying to sit: Min guard       General bed mobility comments: with rails; heavy reliance on UEs and rails  Transfers Overall transfer level: Needs assistance Equipment used: Rolling walker (2 wheeled) Transfers: Sit to/from [facility] Sit to Stand: Min assist;+2 safety/equipment;+2 physical assistance Stand pivot transfers: Max assist;+2 physical assistance;+2 safety/equipment       General transfer comment: x2 to stand from EOB; step pivot with RW x 1 with very discoordinated LE movements (scissoring) and weak torso/hips  Ambulation/Gait                 Stairs            Wheelchair Mobility    Modified Rankin (Stroke Patients Only)       Balance Overall balance assessment: Needs assistance Sitting-balance support: Feet supported;Bilateral upper extremity supported Sitting balance-Leahy Scale: Poor Sitting balance - Comments: close supervision due to impulsivity   Standing balance support: Bilateral upper extremity supported Standing balance-Leahy Scale: Zero Standing balance comment: initially with incr  anterior lean requiring assist to bring hips/pelvis posterior over his mid-foot to heels                    Cognition Arousal/Alertness: Awake/alert Behavior During Therapy: Restless;Impulsive Overall Cognitive Status: No family/caregiver present to determine baseline cognitive functioning Area of Impairment: Safety/judgement         Safety/Judgement: Decreased awareness of safety;Decreased awareness of deficits Awareness: Emergent        Exercises General Exercises - Lower Extremity Ankle Circles/Pumps: AROM;Both;10 reps Long Arc Quad: AROM;Both;Other reps (comment) Straight Leg Raises: AROM;Both;Supine (x1) Low Level/ICU Exercises Stabilized Bridging: AAROM;Both (4 reps; feet stabilized on slick linens)    General Comments        Pertinent Vitals/Pain Pain Assessment: No/denies pain    Home Living                      Prior Function            PT Goals (current goals can now be found in the care plan section) Acute Rehab PT Goals Patient Stated Goal: pt wants to get up OOB Progress towards PT goals: Progressing toward goals    Frequency  Min 3X/week    PT Plan Current plan remains appropriate    Co-evaluation             End of Session Equipment Utilized During Treatment: Gait belt Activity Tolerance: Patient tolerated treatment well Patient left: in chair;with call bell/phone within reach;with chair alarm set     Time: 1315-1340 PT Time Calculation (min) (ACUTE ONLY): 25 min  Charges:  $Therapeutic Exercise: 8-22 mins $Therapeutic Activity: 8-22  mins                    G Codes:      Nani Ingram 07-14-14, 1:55 PM Pager [phone]

## 2014-07-10 NOTE — Progress Notes (Signed)
NIF >-60 and VC 2.5L.

## 2014-07-10 NOTE — Progress Notes (Signed)
rec'd call from lab that pt's pt's stool sample was tested positive for c-diff. enteric precautions are in place. Nurse notified MD, Dr. Hartford Poli.  Will monitor.   Frank [name] I 07/10/2014 4:55 PM

## 2014-07-10 NOTE — Progress Notes (Signed)
Rehab admissions - I met with patient this am and he is interested in inpatient rehab admission.  I have called [employer], opened the case and I have sent all clinicals to insurance carrier.  Will await response from insurance case manager.  Call me for questions.  #[phone]

## 2014-07-10 NOTE — Discharge Summary (Addendum)
Physician Discharge Summary  Frank [name] TDD:[id number] DOB: Dec 28, 1955 DOA: 06/30/2014  PCP: Cathlean Cower, MD  Admit date: 06/30/2014 Discharge date: 07/11/2014  Time spent: 40 minutes  Recommendations for Outpatient Follow-up:  1. Follow-up with rehabilitation M.D. in the [place]. 2. Continue Flagyl for 7 more days.  Discharge Diagnoses:  Principal Problem:   Acute respiratory failure with hypoxia Active Problems:   Rapidly progressive weakness   Ascending paralysis   Protein-calorie malnutrition, severe   Acute respiratory acidosis   History of ETT   Pain   Acute respiratory failure   Encounter for orogastric (OG) tube placement   Discharge Condition: Stable  Diet recommendation: Dysphagia 3 with thin liquids heart healthy diet  Filed Weights   07/06/14 1259 07/08/14 0220 07/10/14 0941  Weight: 80.332 kg (177 lb 1.6 oz) 81.364 kg (179 lb 6 oz) 81.693 kg (180 lb 1.6 oz)    History of present illness:  58 yo WM with progressive ascending neuromuscular weakness over several months who presents with inability to walk, dysarthria, profound weakness and impending respiratory failure. 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 Neurology has requested PCCM to placed HD catheter for plasma exchange and he needs intubation for airway protection.    Hospital Course:   Acute respiratory failure with hypoxia After admission to the hospital patient intubated and mechanically ventilated secondary to progressive weakness. Been extubated since 11/25.  Dysphagia well was on 2 L of oxygen, then was on room air. Off of antibiotics.  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 Plasma exchange was done 5 Patient weakness as well as lung mechanics improved. Workup negative for infectious encephalitis, negative for acetylcholine receptors antibodies. Patient needs extensive rehabilitation per PT recommendation as well as  neurology.  Dysphagia Seen by speech-language pathology and modified barium swallow study done Recommended dysphagia 3 with thin liquids.  Severe protein calorie malnutrition Seen by dietitian.  Anxiety Patient did have severe anxiety especially with him not able to breathe efficiently. On Ativan IV as needed.  C. difficile colitis One day prior to admission patient complained about 1-2 loose bowel movements per day for the past 2 days. This is came positive for C. difficile, Flagyl started to complete for 7-10 days.   Procedures:  11/20 admitted to the ICU intubated.  11/20 insertion of hemodialysis catheter done by Dr. Nelda Marseille.  11/25 extubated.  11/27 MBSS recommended dysphagia 3 with thin liquids  Consultations:  Was on PCCM service till 11/27, [facility] hospitalists assumed care on 11/28.  Neurology.  CIR consulted for admission.  Discharge Exam: Filed Vitals:   07/11/14 1013  BP: 125/85  Pulse: 83  Temp: 97.6 F (36.4 C)  Resp: 17   General: Alert and awake, oriented x3, not in any acute distress. HEENT: anicteric sclera, pupils reactive to light and accommodation, EOMI CVS: S1-S2 clear, no murmur rubs or gallops Chest: clear to auscultation bilaterally, no wheezing, rales or rhonchi Abdomen: soft nontender, nondistended, normal bowel sounds, no organomegaly Extremities: no cyanosis, clubbing or edema noted bilaterally Neuro: Cranial nerves II-XII intact, no focal neurological deficits  Discharge Instructions You were cared for by a hospitalist during your hospital stay. If you have any questions about your discharge medications or the care you received while you were in the hospital after you are discharged, you can call the unit and asked to speak with the hospitalist on call if the hospitalist that took care of you is not available. Once you are discharged, your primary care physician  will handle any further medical issues. Please note that NO REFILLS for any  discharge medications will be authorized once you are discharged, as it is imperative that you return to your primary care physician (or establish a relationship with a primary care physician if you do not have one) for your aftercare needs so that they can reassess your need for medications and monitor your lab values.  Discharge Instructions    Diet - low sodium heart healthy    Complete by:  As directed      Increase activity slowly    Complete by:  As directed           Current Discharge Medication List    START taking these medications   Details  metroNIDAZOLE (FLAGYL) 500 MG tablet Take 1 tablet (500 mg total) by mouth every 8 (eight) hours.      CONTINUE these medications which have NOT CHANGED   Details  allopurinol (ZYLOPRIM) 100 MG tablet Take 2 tablets (200 mg total) by mouth daily. Qty: 30 tablet, Refills: 6    amLODipine (NORVASC) 10 MG tablet Take 1 tablet (10 mg total) by mouth daily. Qty: 90 tablet, Refills: 3    atorvastatin (LIPITOR) 10 MG tablet Take 10 mg by mouth daily.    clonazePAM (KLONOPIN) 0.5 MG tablet Take 1 tablet (0.5 mg total) by mouth 2 (two) times daily as needed for anxiety. Qty: 30 tablet, Refills: 0    colchicine 0.6 MG tablet Take 1 tablet (0.6 mg total) by mouth 2 (two) times daily. Qty: 10 tablet, Refills: 0    DULoxetine (CYMBALTA) 30 MG capsule Take 30 mg by mouth daily.     glipiZIDE (GLUCOTROL XL) 2.5 MG 24 hr tablet Take 1 tablet (2.5 mg total) by mouth daily with breakfast. Qty: 90 tablet, Refills: 3    hydrALAZINE (APRESOLINE) 10 MG tablet Take 1 tablet (10 mg total) by mouth 3 (three) times daily. Qty: 90 tablet, Refills: 1    methylphenidate 54 MG PO CR tablet Take 54 mg by mouth 2 (two) times daily.    metoCLOPramide (REGLAN) 5 MG tablet Take 1 tablet (5 mg total) by mouth 4 (four) times daily -  before meals and at bedtime. Qty: 90 tablet, Refills: 1    NUCYNTA 100 MG TABS Take 1 tablet by mouth 3 (three) times daily.      oxyCODONE-acetaminophen (PERCOCET) 7.5-325 MG per tablet Take 1 tablet by mouth every 6 (six) hours as needed for pain. Qty: 20 tablet, Refills: 0    pantoprazole (PROTONIX) 40 MG tablet Take 1 tablet (40 mg total) by mouth daily. Qty: 30 tablet, Refills: 0    pregabalin (LYRICA) 200 MG capsule Take 200 mg by mouth 2 (two) times daily.    glucose blood (FREESTYLE TEST STRIPS) test strip Use as instructed Qty: 100 each, Refills: 12    !! ondansetron (ZOFRAN) 4 MG tablet Take 1 tablet (4 mg total) by mouth 2 (two) times daily. Qty: 60 tablet, Refills: 1    !! ondansetron (ZOFRAN) 4 MG tablet Take 1 tablet (4 mg total) by mouth every 8 (eight) hours as needed for nausea or vomiting. Qty: 90 tablet, Refills: 0     !! - Potential duplicate medications found. Please discuss with provider.    STOP taking these medications     levofloxacin (LEVAQUIN) 500 MG tablet      meloxicam (MOBIC) 15 MG tablet        Allergies  Allergen Reactions  .  Codeine Itching    REACTION: Itching  . Metformin And Related Other (See Comments)    GI upset, diarrhea   Follow-up Information    Follow up with Cathlean Cower, MD In 1 week.   Specialties:  Internal Medicine, Radiology   Contact information:   [address] [address] [phone]        The results of significant diagnostics from this hospitalization (including imaging, microbiology, ancillary and laboratory) are listed below for reference.    Significant Diagnostic Studies: Dg Chest 2 View  06/17/2014   CLINICAL DATA:  Chest pain radiating through to the back for 3 days. History of hypertension. Diabetic.  EXAM: CHEST  2 VIEW  COMPARISON:  03/12/2010  FINDINGS: Normal heart, mediastinum hila.  Lungs are clear.  No pleural effusion or pneumothorax.  Bony thorax is demineralized. Mild degenerative changes noted along the mid thoracic spine.  IMPRESSION: No active cardiopulmonary disease.   Electronically Signed   By:  Lajean Manes M.D.   On: 06/17/2014 19:17   Dg Abd 1 View  06/21/2014   CLINICAL DATA:  Constipation.  Generalized abdominal pain.  EXAM: ABDOMEN - 1 VIEW  COMPARISON:  06/17/2014  FINDINGS: Moderate stool burden throughout the colon. Nonobstructive bowel gas pattern. No free air. No organomegaly or suspicious calcification. Visualized lung bases are clear.  IMPRESSION: Moderate stool burden.  No acute findings.   Electronically Signed   By: Rolm Baptise M.D.   On: 06/21/2014 21:11   Ct Head Wo Contrast  06/30/2014   CLINICAL DATA:  Weakness. Unable to ambulate for 3 days. No injury.  EXAM: CT HEAD WITHOUT CONTRAST  CT CERVICAL SPINE WITHOUT CONTRAST  TECHNIQUE: Multidetector CT imaging of the head and cervical spine was performed following the standard protocol without intravenous contrast. Multiplanar CT image reconstructions of the cervical spine were also generated.  COMPARISON:  CT head 06/17/2014  FINDINGS: CT HEAD FINDINGS  Ventricles and sulci appear symmetrical. No mass effect or midline shift. No abnormal extra-axial fluid collections. Gray-white matter junctions are distinct. Basal cisterns are not effaced. No evidence of acute intracranial hemorrhage. No depressed skull fractures. Visualized paranasal sinuses and mastoid air cells are not opacified.  CT CERVICAL SPINE FINDINGS  Reversal of the usual cervical lordosis which may be due to patient positioning but ligamentous injury or muscle spasm could also have this appearance and are not excluded. Diffuse degenerative change throughout the cervical spine with narrowed cervical interspaces and associated endplate hypertrophic changes. Slight anterior subluxation at C7-T1 which is probably degenerative. Degenerative changes throughout the facet joints. Degenerative changes at C1 to. C1-2 articulation appears intact. No focal bone lesion or bone destruction. Bone cortex and trabecular architecture appear intact. Soft tissues are unremarkable.   IMPRESSION: No acute intracranial abnormalities.  Degenerative changes throughout the cervical spine. Nonspecific reversal of the usual cervical lordosis. No displaced fractures are appreciated.   Electronically Signed   By: Lucienne Capers M.D.   On: 06/30/2014 01:44   Ct Head Wo Contrast  06/17/2014   CLINICAL DATA:  Pt c/o headache since Thursday, no injury, blurred vision, pt poor historian, seems confused, unable to provide detailed hx  EXAM: CT HEAD WITHOUT CONTRAST  TECHNIQUE: Contiguous axial images were obtained from the base of the skull through the vertex without intravenous contrast.  COMPARISON:  None.  FINDINGS: Ventricles are normal in configuration. There is ventricular and sulcal enlargement reflecting mild age related volume loss. No hydrocephalus. No parenchymal masses  or mass effect. There is no evidence of an infarct. Minor periventricular white matter hypoattenuation is noted consistent with chronic microvascular ischemic change.  There are no extra-axial masses or abnormal fluid collections.  No intracranial hemorrhage.  Visualized sinuses and mastoid air cells are clear.  IMPRESSION: 1. No acute intracranial abnormalities. 2. Age related volume loss and minor chronic microvascular ischemic change.   Electronically Signed   By: Lajean Manes M.D.   On: 06/17/2014 20:35   Ct Cervical Spine Wo Contrast  06/30/2014   CLINICAL DATA:  Weakness. Unable to ambulate for 3 days. No injury.  EXAM: CT HEAD WITHOUT CONTRAST  CT CERVICAL SPINE WITHOUT CONTRAST  TECHNIQUE: Multidetector CT imaging of the head and cervical spine was performed following the standard protocol without intravenous contrast. Multiplanar CT image reconstructions of the cervical spine were also generated.  COMPARISON:  CT head 06/17/2014  FINDINGS: CT HEAD FINDINGS  Ventricles and sulci appear symmetrical. No mass effect or midline shift. No abnormal extra-axial fluid collections. Gray-white matter junctions are distinct.  Basal cisterns are not effaced. No evidence of acute intracranial hemorrhage. No depressed skull fractures. Visualized paranasal sinuses and mastoid air cells are not opacified.  CT CERVICAL SPINE FINDINGS  Reversal of the usual cervical lordosis which may be due to patient positioning but ligamentous injury or muscle spasm could also have this appearance and are not excluded. Diffuse degenerative change throughout the cervical spine with narrowed cervical interspaces and associated endplate hypertrophic changes. Slight anterior subluxation at C7-T1 which is probably degenerative. Degenerative changes throughout the facet joints. Degenerative changes at C1 to. C1-2 articulation appears intact. No focal bone lesion or bone destruction. Bone cortex and trabecular architecture appear intact. Soft tissues are unremarkable.  IMPRESSION: No acute intracranial abnormalities.  Degenerative changes throughout the cervical spine. Nonspecific reversal of the usual cervical lordosis. No displaced fractures are appreciated.   Electronically Signed   By: Lucienne Capers M.D.   On: 06/30/2014 01:44   Ct Angio Abdomen W/cm &/or Wo Contrast  06/17/2014   CLINICAL DATA:  Mid thoracic back pain.  EXAM: CT ANGIOGRAPHY CHEST, ABDOMEN AND PELVIS  TECHNIQUE: Multidetector CT imaging through the chest, abdomen and pelvis was performed using the standard protocol during bolus administration of intravenous contrast. Multiplanar reconstructed images and MIPs were obtained and reviewed to evaluate the vascular anatomy.  COMPARISON:  Radiograph 06/17/2004  FINDINGS: CTA CHEST FINDINGS  Non IV contrast images demonstrate no intramural hematoma within the thoracic aorta.  Contrast images demonstrate no contour abnormality of the aorta to suggest dissection or aneurysm. The great vessels are normal. No central pulmonary embolism is present. No pericardial fluid.  Review of the lung parenchyma demonstrates no pneumothorax. No pleural fluid or  infiltrate. No axillary or supraclavicular adenopathy. No mediastinal adenopathy. Esophagus is normal. No mediastinal gas.  Review of the MIP images confirms the above findings.  CTA ABDOMEN AND PELVIS FINDINGS  There is no aneurysm or dissection of the abdominal aorta or iliac arteries. Mild intimal calcification of the abdominal aorta. The superior mesenteric artery is narrowed at its origin. The SMA is widely patent. There are 2 renal arteries on the left and 2 renal arteries on the right. IMA is patent.  No focal hepatic lesion. Gallbladder, pancreas, spleen, adrenal glands normal. Kidneys are normal with no obstruction of the ureters. The bladder is normal.  Stomach, small bowel, appendix, cecum normal. The colon and rectosigmoid colon are normal.  No free fluid the pelvis. The prostate gland  and bladder normal. No aggressive osseous lesion or acute lesion in the skeleton.  Review of the MIP images confirms the above findings.  IMPRESSION: Chest Impression:  1. No thoracic aortic aneurysm or dissection. 2. No pulmonary embolism. 3. No pneumothorax. 4. No mediastinal gas.  Abdomen / Pelvis Impression:  1. No evidence of abdominal aortic aneurysm or dissection. 2. Mild atherosclerotic calcification aorta. 3. No acute findings in the abdomen or pelvis. 4. No evidence of fracture.   Electronically Signed   By: Suzy Bouchard M.D.   On: 06/17/2014 21:03   Mr Cervical Spine Wo Contrast  07/02/2014   CLINICAL DATA:  Descending numbness and weakness, pain. Possible Guillain-Barre syndrome versus transverse myelitis.  EXAM: MRI CERVICAL AND THORACIC SPINE WITHOUT CONTRAST  TECHNIQUE: Multiplanar and multiecho pulse sequences of the cervical spine, to include the craniocervical junction and cervicothoracic junction, and thoracic spine, were obtained without intravenous contrast.  COMPARISON:  CT of the head and cervical spine June 30, 2014  FINDINGS: MRI CERVICAL SPINE FINDINGS  Cervical vertebral bodies and  posterior elements appear intact. Straightened cervical lordosis. 2 mm grade 1 C7-T1 anterolisthesis. Mild C4-5, moderate C5-6 and mild C6-7 degenerative discs. Moderate subacute to chronic discogenic endplate changes X3-2 and C5-6, mild to moderate C6-7. Mild subacute discogenic endplate changes G4-0. No STIR signal abnormality to suggest acute osseous process.  Cervical spinal cord appears normal morphology and signal characteristics from the cervicomedullary junction to at least the level T1-2 Common the most caudal well visualized level. Craniocervical junction is intact. Life support lines in place (endotracheal and nasogastric tube), partially imaged RIGHT internal jugular central venous catheter partially characterized.  Level by level evaluation:  C1-2: 2 mm broad-based disc bulge asymmetric to the RIGHT. Uncal vertebral hypertrophy. Severe RIGHT and mild LEFT facet arthropathy. No canal stenosis. Moderate to severe RIGHT neural foraminal narrowing.  C3-4: Uncovertebral hypertrophy and mild facet arthropathy. Mild canal stenosis. Mild LEFT neural foraminal narrowing.  C4-5: 3 mm broad-based disc bulge, uncovertebral hypertrophy and mild facet arthropathy. Mild to moderate canal stenosis. Moderate to severe RIGHT, moderate LEFT neural foraminal narrowing.  C5-6: 2 mm broad-based disc bulge, uncovertebral hypertrophy and mild to moderate facet arthropathy. Moderate canal stenosis. Severe bilateral neural foraminal narrowing.  C6-7: 2 mm broad-based disc bulge, uncovertebral hypertrophy and mild facet arthropathy. Mild canal stenosis. Moderate to severe RIGHT, severe LEFT neural foraminal narrowing.  C7-T1: Anterolisthesis. Moderate facet arthropathy without canal stenosis or neural foraminal narrowing.  MRI THORACIC SPINE FINDINGS  Vertebral bodies and posterior elements are intact and aligned and maintenance of thoracic kyphosis. Intervertebral disc morphology is generally preserved with decreased T2 signal  consistent with mild desiccation, superimposed mineralization of this T6-7, and T8-9 discs. Mild chronic discogenic endplate changes and mid thoracic spine. No STIR signal abnormality to suggest acute osseous process.  Thoracic spinal cord appears normal morphology and signal characteristics level the conus medullaris which is partially imaged at L1. Included view of the chest demonstrates consolidated lung bases with small pleural effusions. Paraspinal soft tissues are demonstrated asymmetric RIGHT muscle atrophy versus denervation.  At T7-8 LEFT central, probably acute 3 mm disc protrusion with annular fissure deforms the LEFT ventral spinal cord. No canal stenosis. No neural foraminal narrowing at any thoracic level.  IMPRESSION: MRI CERVICAL SPINE: Normal noncontrast MRI of the cervical spinal cord, if clinical concern persists for cord pathology, contrast enhanced sequences would be more sensitive for acute changes.  Cervical spondylosis without acute fracture or malalignment.  Moderate canal  stenosis C5-6, mild to moderate at C4-5. Neural foraminal narrowing C1-2 thru C6-7: Severe on the RIGHT at C1-2, bilaterally at C5-6 and on the LEFT at C6-7.  MRI SPINE: No abnormal thoracic spinal cord signal, if clinical concern persists for cord pathology, contrast enhanced sequences would be more sensitive for acute changes.  Thoracic spondylosis without acute fracture or malalignment.  LEFT central T7-8 acute appearing disc protrusion deforms the ventral spinal cord.  Dense consolidation in the lung bases most consistent with pneumonia.   Electronically Signed   By: Elon Alas   On: 07/02/2014 19:05   Mr Thoracic Spine Wo Contrast  07/02/2014   CLINICAL DATA:  Descending numbness and weakness, pain. Possible Guillain-Barre syndrome versus transverse myelitis.  EXAM: MRI CERVICAL AND THORACIC SPINE WITHOUT CONTRAST  TECHNIQUE: Multiplanar and multiecho pulse sequences of the cervical spine, to include the  craniocervical junction and cervicothoracic junction, and thoracic spine, were obtained without intravenous contrast.  COMPARISON:  CT of the head and cervical spine June 30, 2014  FINDINGS: MRI CERVICAL SPINE FINDINGS  Cervical vertebral bodies and posterior elements appear intact. Straightened cervical lordosis. 2 mm grade 1 C7-T1 anterolisthesis. Mild C4-5, moderate C5-6 and mild C6-7 degenerative discs. Moderate subacute to chronic discogenic endplate changes E3-6 and C5-6, mild to moderate C6-7. Mild subacute discogenic endplate changes O2-9. No STIR signal abnormality to suggest acute osseous process.  Cervical spinal cord appears normal morphology and signal characteristics from the cervicomedullary junction to at least the level T1-2 Common the most caudal well visualized level. Craniocervical junction is intact. Life support lines in place (endotracheal and nasogastric tube), partially imaged RIGHT internal jugular central venous catheter partially characterized.  Level by level evaluation:  C1-2: 2 mm broad-based disc bulge asymmetric to the RIGHT. Uncal vertebral hypertrophy. Severe RIGHT and mild LEFT facet arthropathy. No canal stenosis. Moderate to severe RIGHT neural foraminal narrowing.  C3-4: Uncovertebral hypertrophy and mild facet arthropathy. Mild canal stenosis. Mild LEFT neural foraminal narrowing.  C4-5: 3 mm broad-based disc bulge, uncovertebral hypertrophy and mild facet arthropathy. Mild to moderate canal stenosis. Moderate to severe RIGHT, moderate LEFT neural foraminal narrowing.  C5-6: 2 mm broad-based disc bulge, uncovertebral hypertrophy and mild to moderate facet arthropathy. Moderate canal stenosis. Severe bilateral neural foraminal narrowing.  C6-7: 2 mm broad-based disc bulge, uncovertebral hypertrophy and mild facet arthropathy. Mild canal stenosis. Moderate to severe RIGHT, severe LEFT neural foraminal narrowing.  C7-T1: Anterolisthesis. Moderate facet arthropathy without  canal stenosis or neural foraminal narrowing.  MRI THORACIC SPINE FINDINGS  Vertebral bodies and posterior elements are intact and aligned and maintenance of thoracic kyphosis. Intervertebral disc morphology is generally preserved with decreased T2 signal consistent with mild desiccation, superimposed mineralization of this T6-7, and T8-9 discs. Mild chronic discogenic endplate changes and mid thoracic spine. No STIR signal abnormality to suggest acute osseous process.  Thoracic spinal cord appears normal morphology and signal characteristics level the conus medullaris which is partially imaged at L1. Included view of the chest demonstrates consolidated lung bases with small pleural effusions. Paraspinal soft tissues are demonstrated asymmetric RIGHT muscle atrophy versus denervation.  At T7-8 LEFT central, probably acute 3 mm disc protrusion with annular fissure deforms the LEFT ventral spinal cord. No canal stenosis. No neural foraminal narrowing at any thoracic level.  IMPRESSION: MRI CERVICAL SPINE: Normal noncontrast MRI of the cervical spinal cord, if clinical concern persists for cord pathology, contrast enhanced sequences would be more sensitive for acute changes.  Cervical spondylosis without acute fracture  or malalignment.  Moderate canal stenosis C5-6, mild to moderate at C4-5. Neural foraminal narrowing C1-2 thru C6-7: Severe on the RIGHT at C1-2, bilaterally at C5-6 and on the LEFT at C6-7.  MRI SPINE: No abnormal thoracic spinal cord signal, if clinical concern persists for cord pathology, contrast enhanced sequences would be more sensitive for acute changes.  Thoracic spondylosis without acute fracture or malalignment.  LEFT central T7-8 acute appearing disc protrusion deforms the ventral spinal cord.  Dense consolidation in the lung bases most consistent with pneumonia.   Electronically Signed   By: Elon Alas   On: 07/02/2014 19:05   Mr Thoracic Spine Wo Contrast  06/22/2014   CLINICAL  DATA:  Back pain after a lifting injury. This occurred several days ago. Initial encounter.  EXAM: MRI THORACIC SPINE WITHOUT CONTRAST  TECHNIQUE: Multiplanar, multisequence MR imaging of the thoracic spine was performed. No intravenous contrast was administered.  COMPARISON:  CT chest 06/17/2014.  FINDINGS: Segmentation: Normal. The thoracic spine was labeled by counting down from the odontoid. There is agreement with numbering scheme used for the lumbar MRI.  Alignment:  Normal.  Vertebrae: No worrisome osseous lesions. Minor Schmorl's nodes without compression deformity. Exuberant osteophytic spurring anteriorly and to the RIGHT, commonly seen in this age group.  Spinal cord: Normal in size and signal except for T7-8, described below. No intraspinal masses.  Paraspinal tissues: No worrisome extra-spinal abnormalities.  Disc levels:  The thoracic disc levels are within normal limits except for T7-8, where there is a LEFT paracentral extrusion. This disc herniation effaces the anterior subarachnoid space and mildly flattens the cord, as seen on image 23 series 8. Again, no abnormal cord signal. No other areas of significant thoracic disc pathology.  IMPRESSION: LEFT paracentral disc extrusion at T7-8. Effacement of the anterior subarachnoid space and mild cord flattening. No abnormal cord signal.  No compression deformity or other a worrisome finding.   Electronically Signed   By: Rolla Flatten M.D.   On: 06/22/2014 18:19   Mr Lumbar Spine Wo Contrast  06/20/2014   CLINICAL DATA:  Low back pain radiating into both legs, worse on the right. Evaluate for disc herniation. Initial encounter  EXAM: MRI LUMBAR SPINE WITHOUT CONTRAST  TECHNIQUE: Multiplanar, multisequence MR imaging of the lumbar spine was performed. No intravenous contrast was administered.  COMPARISON:  CTs of the chest, abdomen and pelvis 06/17/2014.  FINDINGS: There is transitional lumbosacral anatomy. As correlated with prior CT, there are small  ribs at T12 and a partially sacralized L5 segment. The alignment is normal. There is no evidence of fracture or pars defect. There are scattered type 2 endplate degenerative changes, greatest at L1-2 and L3-4.  The conus medullaris extends to the T12-L1 level and appears normal. No paraspinal abnormalities are identified.  There are small anterior osteophytes within the lower thoracic spine, but no disc herniation, spinal stenosis or nerve root encroachment.  L1-2: Mild disc bulging with anterior osteophytes and mild facet hypertrophy. No spinal stenosis or nerve root encroachment.  L2-3:  Normal interspace.  L3-4: There is mild loss of disc height with annular bulging and endplate degeneration. There is mild facet and ligamentous hypertrophy. These factors contribute to mild lateral recess and foraminal stenosis bilaterally. There is no focal disc herniation or definite nerve root encroachment.  L4-5: Disc height is relatively maintained. There is mild disc bulging asymmetric to the right and mild bilateral facet hypertrophy. There is minimal narrowing of the right foramen without definite nerve  root encroachment.  L5-S1: Axial images were not obtained through this vestigial disc space. The disc space appears fused. There is no spinal stenosis or nerve root encroachment.  IMPRESSION: 1. Transitional lumbosacral anatomy.  L5 is largely sacralized. 2. Mild lumbar spine degenerative changes, greatest at L3-4 where there is annular disc bulging, facet and ligamentous hypertrophy contributing to mild lateral recess and foraminal stenosis bilaterally. 3. No focal disc herniation or definite nerve root encroachment.   Electronically Signed   By: Camie Patience M.D.   On: 06/20/2014 10:22   Dg Chest Port 1 View  07/05/2014   CLINICAL DATA:  Acute respiratory failure ; intubated patient.  EXAM: PORTABLE CHEST - 1 VIEW  COMPARISON:  Portable chest x-ray of July 04, 2014.  FINDINGS: The lungs are adequately inflated  with decreased bibasilar atelectasis. The heart and pulmonary vascularity are normal. There is no pleural effusion or pneumothorax. The endotracheal tube tip projects 6.3 cm above the crotch of the carina. The esophagogastric tube tip projects in the gastric cardia but the proximal port lies at or above the GE junction. Advancement by 5 to 10 cm is recommended. The right internal jugular Cordis sheath tip projects over the proximal SVC.  IMPRESSION: Improved appearance of both bases consistent with resolving atelectasis. There is no evidence of pulmonary edema. The support tubes lines are in position as described. Advancement of the esophagogastric tube is recommended.   Electronically Signed   By: David  Martinique   On: 07/05/2014 07:04   Dg Chest Port 1 View  07/04/2014   CLINICAL DATA:  Acute respiratory acidosis.  EXAM: PORTABLE CHEST - 1 VIEW  COMPARISON:  07/03/2014.  FINDINGS: Endotracheal tube terminates approximately 5.7 cm above the carina. Nasogastric tube is followed into the stomach. Right IJ central line tip projects over the SVC.  Heart size is stable. Lungs are somewhat low in volume with bibasilar airspace disease, right greater than left. Biapical pleural thickening. No pleural fluid. No pneumothorax.  IMPRESSION: Bibasilar airspace disease, right greater than left, possibly due to atelectasis. Difficult to exclude pneumonia.   Electronically Signed   By: Lorin Picket M.D.   On: 07/04/2014 07:38   Dg Chest Port 1 View  07/03/2014   CLINICAL DATA:  Intubation.  EXAM: PORTABLE CHEST - 1 VIEW  COMPARISON:  05/02/2014.  FINDINGS: Endotracheal tube, NG tube, right IJ line in stable position. Bibasilar atelectasis and/or infiltrates noted. No pleural effusion or pneumothorax. Heart size is stable. No acute osseous abnormality.  IMPRESSION: 1. Lines and tubes in stable position. 2. Bibasilar subsegment atelectasis and/or infiltrates again noted.   Electronically Signed   By: Marcello Moores  Register   On:  07/03/2014 07:49   Dg Chest Port 1 View  07/02/2014   CLINICAL DATA:  Intubation  EXAM: PORTABLE CHEST - 1 VIEW  COMPARISON:  Portable exam 0525 hr compared to 07/01/2014  FINDINGS: Tip of endotracheal tube projects approximately 4.3 cm above carina.  Nasogastric tube extends into stomach.  RIGHT jugular central venous catheter tip projects mid SVC.  Normal heart size, mediastinal contours and pulmonary vascularity.  Bibasilar atelectasis.  No definite infiltrate, pleural effusion or pneumothorax.  IMPRESSION: Bibasilar atelectasis.   Electronically Signed   By: Lavonia Dana M.D.   On: 07/02/2014 07:33   Dg Chest Port 1 View  07/01/2014   CLINICAL DATA:  Endotracheal tube placement.  Weakness.  EXAM: PORTABLE CHEST - 1 VIEW  COMPARISON:  Multiple exams, including 06/30/2014  FINDINGS: Endotracheal tube tip  4.5 cm above the carina. Nasogastric tube enters the stomach. Right IJ line tip: SVC.  The patient is rotated to the Left on today's radiograph, reducing diagnostic sensitivity and specificity. Low lung volumes are present, causing crowding of the pulmonary vasculature. Left basilar airspace opacity mostly persists, although is mildly improved. Accounting for rotation, the right lung appears clear. No pneumothorax.  IMPRESSION: 1. There is some persistent retrocardiac airspace opacity although this appears mildly improved compared to the prior exam. 2. Tubes and lines appear satisfactorily positioned.   Electronically Signed   By: Sherryl Barters M.D.   On: 07/01/2014 08:21   Dg Chest Port 1 View  06/30/2014   CLINICAL DATA:  Endotracheal tube placement.  EXAM: PORTABLE CHEST - 1 VIEW  COMPARISON:  Same day.  FINDINGS: Stable cardiomediastinal silhouette. Endotracheal tube is in grossly good position with distal tip 7 cm above the carina. Nasogastric tube is seen entering the stomach. Interval placement of right internal jugular catheter with distal tip in expected position of the SVC. Minimal right  basilar subsegmental atelectasis is noted. Mild left basilar opacity is noted concerning for atelectasis or pneumonia. No pneumothorax or significant pleural effusion is noted.  IMPRESSION: Endotracheal tube in grossly good position. Nasogastric tube and right internal jugular catheter also in good position. Minimal right basilar subsegmental atelectasis. Mild left basilar pneumonia or subsegmental atelectasis is noted.   Electronically Signed   By: Sabino Dick M.D.   On: 06/30/2014 11:16   Dg Chest Port 1 View  06/30/2014   CLINICAL DATA:  Shortness of Breath  EXAM: PORTABLE CHEST - 1 VIEW  COMPARISON:  06/24/2014  FINDINGS: Shallow inspiration with infiltration or atelectasis in the lung bases, increasing since prior study. Heart size and pulmonary vascularity are normal. No blunting of costophrenic angles. No pneumothorax.  IMPRESSION: Shallow inspiration with linear atelectasis or infiltration in the lung bases.   Electronically Signed   By: Lucienne Capers M.D.   On: 06/30/2014 04:03   Dg Chest Port 1 View  06/24/2014   CLINICAL DATA:  58 year old male with leukocytosis.  EXAM: PORTABLE CHEST - 1 VIEW  COMPARISON:  06/17/2014 and prior chest radiographs dating back to 04/23/2006  FINDINGS: Left lower lung airspace opacities are suspicious for pneumonia or aspiration.  Mild bibasilar atelectasis is noted is low volume film.  The cardiomediastinal silhouette is unremarkable.  There is no evidence of pulmonary edema, suspicious pulmonary nodule/mass, pleural effusion, or pneumothorax. No acute bony abnormalities are identified.  IMPRESSION: Left lower lung airspace opacities suspicious for pneumonia or aspiration.  Low volume film with mild bibasilar atelectasis.   Electronically Signed   By: Hassan Rowan M.D.   On: 06/24/2014 13:51   Dg Abd Portable 1v  07/04/2014   CLINICAL DATA:  Orogastric tube placement  EXAM: PORTABLE ABDOMEN - 1 VIEW  COMPARISON:  None.  FINDINGS: Orogastric tube with the tip  projecting over the fundus of the stomach. There is no bowel dilatation to suggest obstruction. There is no evidence of pneumoperitoneum, portal venous gas or pneumatosis. There are no pathologic calcifications along the expected course of the ureters.The osseous structures are unremarkable.  IMPRESSION: Orogastric tube with the tip projecting over the fundus of the stomach.   Electronically Signed   By: Kathreen Devoid   On: 07/04/2014 21:35   Dg Swallowing Func-speech Pathology  07/07/2014   Amy Dionicia Abler, CCC-SLP     07/07/2014  3:03 PM Objective Swallowing Evaluation: Modified Barium Swallowing Study  Patient Details  Name: Codee Tutson MRN: [id number] Date of Birth: [birth year]  Today's Date: 07/07/2014 Time: 5462-7035 SLP Time Calculation (min) (ACUTE ONLY): 23 min  Past Medical History:  Past Medical History  Diagnosis Date  . COLONIC POLYPS 02/01/2008  . DIABETES MELLITUS, TYPE II 05/20/2010  . HYPERLIPIDEMIA 10/09/2008  . ANXIETY DEPRESSION 02/01/2008  . ERECTILE DYSFUNCTION 10/09/2008  . ADD 10/09/2008  . SLEEP APNEA, OBSTRUCTIVE 02/01/2008  . MORTON'S NEUROMA 05/20/2010  . PERIPHERAL NEUROPATHY 05/20/2010  . Other specified forms of hearing loss 06/27/2009  . HYPERTENSION 10/09/2008  . HEMORRHOIDS 02/01/2008  . ALLERGIC RHINITIS 10/09/2008  . Stricture and stenosis of esophagus 02/02/2008  . GERD 02/01/2008  . HIATAL HERNIA 02/01/2008  . ERECTILE DYSFUNCTION, ORGANIC 05/20/2010  . WRIST PAIN, LEFT 12/05/2009  . FOOT PAIN, LEFT 05/20/2010  . PERIPHERAL EDEMA 05/20/2010  . DYSPNEA 03/12/2010  . Abdominal pain, unspecified site 01/19/2009  . Abdominal pain, left lower quadrant 06/06/2010  . Type II or unspecified type diabetes mellitus without mention  of complication, uncontrolled 11/14/2010   Past Surgical History:  Past Surgical History  Procedure Laterality Date  . Carpal tunnel release    . Rotator cuff repair    . Esophageal dilation  july 2009  . Eye surgery    . Esophagogastroduodenoscopy N/A 06/27/2014    Procedure:  ESOPHAGOGASTRODUODENOSCOPY (EGD);  Surgeon: Lafayette Dragon, MD;  Location: [facility];  Service: Endoscopy;   Laterality: N/A;   HPI:  58 yo WM with progressive ascending neuromuscular weakness over  several months who presents with inability to walk, dysarthria,  profound weakness and impending respiratory failure. Presumed  GBS. Pt was intubated 11/20-11/25. He describes a h/o esophageal  stretching and new onset nasal regurgitation.     Assessment / Plan / Recommendation Clinical Impression  Dysphagia Diagnosis: Mild-Moderate oral phase dysphagia Clinical impression: The patient presents with a mild-moderate  oral phase dysphagia characterized by delayed oral transit and  reduced lingual manipulation of bolus by cup, resulting in  premature spillage and penetration of thin liquid which remained  above the vocal folds and was effectively ejected out by the  patient. Use of straw eliminated difficulties with oral phase,  and no further penetration was noted. No difficulties noted with  puree or solid. Given these findings, the pt is at risk of  aspiration when drinking by cup but this risk is significantly  reduced when using straw, thereby avoiding lingual manipulation  and oral transit difficulties. Recommend advancing diet to  dysphagia 3, thin liquids by straw only, meds whole with liquid  by straw. Continue providing full supervision to assist with  feeding but allow pt to self-feed as much as possible. Speech  will continue to follow at least x1 to ensure diet tolerance and  review strategies.      Treatment Recommendation  Therapy as outlined in treatment plan below    Diet Recommendation Dysphagia 3 (Mechanical Soft);Thin liquid   Liquid Administration via: Straw Medication Administration: Whole meds with liquid Supervision: Patient able to self feed;Full supervision/cueing  for compensatory strategies Compensations: Slow rate;Small sips/bites Postural Changes and/or Swallow Maneuvers: Seated upright 90   degrees    Other  Recommendations Oral Care Recommendations: Oral care BID   Follow Up Recommendations  Inpatient Rehab    Frequency and Duration min 1 x/week  1 week   Pertinent Vitals/Pain n/a    SLP Swallow Goals     General HPI: 58 yo WM with progressive ascending neuromuscular  weakness over several months who presents with inability to walk,  dysarthria, profound weakness and impending respiratory failure.  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 Pt was intubated 11/20-11/25. He describes a h/o  esophageal stretching and new onset nasal regurgitation. Type of Study: Modified Barium Swallowing Study Reason for Referral: Objectively evaluate swallowing function Previous Swallow Assessment: BSE 11/16 with suspected GI  involvement and ? neurological component, recommended thin  liquids pending MBS however pt then declined study; EGD 2009 with  dilatation of stricture, esophagram 2007 + reflux @ EG junction,  tertiary contractions, prominent cricopharyngeus, barium tablet  cleared Diet Prior to this Study: Dysphagia 2 (chopped);Thin liquids Temperature Spikes Noted: No Respiratory Status: Nasal cannula History of Recent Intubation: Yes Length of Intubations (days): 5 days Date extubated: 07/05/14 Behavior/Cognition: Alert;Agitated;Requires cueing Oral Cavity - Dentition: Adequate natural dentition Self-Feeding Abilities: Needs assist Patient Positioning: Upright in chair Baseline Vocal Quality: Low vocal intensity Anatomy: Within functional limits Pharyngeal Secretions: Not observed secondary MBS    Reason for Referral Objectively evaluate swallowing function   Oral Phase Oral Preparation/Oral Phase Oral Phase: Impaired Oral - Thin Oral - Thin Cup: Reduced posterior propulsion;Delayed oral  transit Oral - Thin Straw: Within functional limits Oral - Solids Oral - Puree: Delayed oral transit;Lingual/palatal residue Oral - Regular: Delayed oral transit;Lingual/palatal residue   Pharyngeal Phase Pharyngeal Phase Pharyngeal Phase: Impaired  Pharyngeal - Thin Pharyngeal - Thin Cup: Penetration/Aspiration before  swallow;Delayed swallow initiation;Premature spillage to  valleculae Penetration/Aspiration details (thin cup): Material enters  airway, remains ABOVE vocal cords then ejected out Pharyngeal - Thin Straw: Within functional limits Pharyngeal - Solids Pharyngeal - Puree: Within functional limits Pharyngeal - Regular: Within functional limits  Cervical Esophageal Phase    GO    Cervical Esophageal Phase Cervical Esophageal Phase: Dorna Mai, Amy K, MA, CCC-SLP 07/07/2014, 3:01 PM    Ct Angio Chest Aorta W/cm &/or Wo/cm  06/17/2014   CLINICAL DATA:  Mid thoracic back pain.  EXAM: CT ANGIOGRAPHY CHEST, ABDOMEN AND PELVIS  TECHNIQUE: Multidetector CT imaging through the chest, abdomen and pelvis was performed using the standard protocol during bolus administration of intravenous contrast. Multiplanar reconstructed images and MIPs were obtained and reviewed to evaluate the vascular anatomy.  COMPARISON:  Radiograph 06/17/2004  FINDINGS: CTA CHEST FINDINGS  Non IV contrast images demonstrate no intramural hematoma within the thoracic aorta.  Contrast images demonstrate no contour abnormality of the aorta to suggest dissection or aneurysm. The great vessels are normal. No central pulmonary embolism is present. No pericardial fluid.  Review of the lung parenchyma demonstrates no pneumothorax. No pleural fluid or infiltrate. No axillary or supraclavicular adenopathy. No mediastinal adenopathy. Esophagus is normal. No mediastinal gas.  Review of the MIP images confirms the above findings.  CTA ABDOMEN AND PELVIS FINDINGS  There is no aneurysm or dissection of the abdominal aorta or iliac arteries. Mild intimal calcification of the abdominal aorta. The superior mesenteric artery is narrowed at its origin. The SMA is widely patent. There are 2 renal arteries on the left and 2 renal arteries on the right. IMA is patent.  No focal hepatic lesion.  Gallbladder, pancreas, spleen, adrenal glands normal. Kidneys are normal with no obstruction of the ureters. The bladder is normal.  Stomach, small bowel, appendix, cecum normal. The colon and rectosigmoid colon are normal.  No free fluid the pelvis. The prostate gland and bladder normal. No aggressive osseous lesion or acute lesion in the skeleton.  Review of the  MIP images confirms the above findings.  IMPRESSION: Chest Impression:  1. No thoracic aortic aneurysm or dissection. 2. No pulmonary embolism. 3. No pneumothorax. 4. No mediastinal gas.  Abdomen / Pelvis Impression:  1. No evidence of abdominal aortic aneurysm or dissection. 2. Mild atherosclerotic calcification aorta. 3. No acute findings in the abdomen or pelvis. 4. No evidence of fracture.   Electronically Signed   By: Suzy Bouchard M.D.   On: 06/17/2014 21:03    Microbiology: Recent Results (from the past 240 hour(s))  Clostridium Difficile by PCR     Status: Abnormal   Collection Time: 07/10/14 11:18 AM  Result Value Ref Range Status   C difficile by pcr POSITIVE (A) NEGATIVE Final    Comment: CRITICAL RESULT CALLED TO, READ BACK BY AND VERIFIED WITH: VASIADIAS RN 16:50 07/10/14 ([email or web address])      Labs: Basic Metabolic Panel:  Recent Labs Lab 07/05/14 0520 07/06/14 0355 07/07/14 0414 07/07/14 1027 07/09/14 0320 07/11/14 0458  NA 140 142 139 138 138 137  K 3.5* 3.6* 3.6* 3.4* 3.8 4.0  CL 100 98 97 93* 101 100  CO2 30 30 27   --  26 23  GLUCOSE 116* 128* 152* 180* 112* 110*  BUN 23 21 31* 31* 22 17  CREATININE 0.67 0.79 1.04 1.00 0.89 0.89  CALCIUM 9.0 9.8 9.9  --  9.3 9.3   Liver Function Tests:  Recent Labs Lab 07/06/14 0355  AST 25  ALT 33  ALKPHOS 39  BILITOT 1.0  PROT 6.5  ALBUMIN 4.6   No results for input(s): LIPASE, AMYLASE in the last 168 hours. No results for input(s): AMMONIA in the last 168 hours. CBC:  Recent Labs Lab 07/05/14 0520 07/07/14 0414 07/07/14 1027 07/09/14 0320 07/11/14 0458   WBC 9.1 9.1  --  7.4 6.3  HGB 10.4* 13.0 12.9* 11.1* 10.8*  HCT 30.7* 37.5* 38.0* 32.1* 32.3*  MCV 88.7 88.4  --  86.8 88.0  PLT 197 336  --  300 308   Cardiac Enzymes: No results for input(s): CKTOTAL, CKMB, CKMBINDEX, TROPONINI in the last 168 hours. BNP: BNP (last 3 results) No results for input(s): PROBNP in the last 8760 hours. CBG:  Recent Labs Lab 07/10/14 1142 07/10/14 1632 07/10/14 2153 07/11/14 0636 07/11/14 1138  GLUCAP 111* 91 142* 149* 127*       Signed:  Lindyn Vossler A  [facility] 07/11/2014, 1:38 PM

## 2014-07-10 NOTE — Progress Notes (Signed)
NIF of -60, VC of 2.5L.  Good patient effort.

## 2014-07-10 NOTE — Progress Notes (Signed)
[facility] PROGRESS NOTE   Tyriq Moragne BJS:[id number] DOB: [birth year] DOA: 06/30/2014 PCP: Cathlean Cower, MD  Patient was in the PCCM till 07/07/14, transferred to hospitalist care on 11/28  HPI/Subjective: Seen sitting at bedside, eating his meal without any problems. Very enthusiastic to get up and start his rehabilitation. His insurance company yet to approve his rehabilitation stay.  Assessment/Plan: Principal Problem:   Acute respiratory failure with hypoxia Active Problems:   Rapidly progressive weakness   Ascending paralysis   Protein-calorie malnutrition, severe   Acute respiratory acidosis   History of ETT   Pain   Acute respiratory failure   Encounter for orogastric (OG) tube placement    Acute respiratory failure with hypoxia After admission to the hospital patient intubated and mechanically ventilated secondary to progressive weakness. Been extubated since 11/25.  Currently doing okay on 2 L of oxygen, very reasonable lung mechanics (NIF and VC). Off of antibiotics.  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 Plasma exchange was done 5 Patient weakness as well as lung mechanics improved.  Dysphagia Seen by speech-language pathology and modified barium swallow study done Recommended dysphagia 3 with thin liquids.  Severe protein calorie malnutrition Seen by dietitian.  Anxiety Patient did have severe anxiety especially with him not able to breathe efficiently. On Ativan IV as needed.  Code Status: Full code Family Communication: Plan discussed with the patient. Disposition Plan: Remains inpatient, Await CIR insurance approval. Likely Monday.   Consultants:  None  Procedures:  None  Antibiotics:  None   Objective: Filed Vitals:   07/10/14 0941  BP: 119/62  Pulse: 99  Temp: 98.4 F (36.9 C)  Resp: 20    Intake/Output Summary (Last 24 hours)  at 07/10/14 1218 Last data filed at 07/10/14 0901  Gross per 24 hour  Intake    360 ml  Output   1550 ml  Net  -1190 ml   Filed Weights   07/06/14 1259 07/08/14 0220 07/10/14 0941  Weight: 80.332 kg (177 lb 1.6 oz) 81.364 kg (179 lb 6 oz) 81.693 kg (180 lb 1.6 oz)    Exam: General: Alert and awake, oriented x3, not in any acute distress. HEENT: anicteric sclera, pupils reactive to light and accommodation, EOMI CVS: S1-S2 clear, no murmur rubs or gallops Chest: clear to auscultation bilaterally, no wheezing, rales or rhonchi Abdomen: soft nontender, nondistended, normal bowel sounds, no organomegaly Extremities: no cyanosis, clubbing or edema noted bilaterally Neuro: Cranial nerves II-XII intact, no focal neurological deficits  Data Reviewed: Basic Metabolic Panel:  Recent Labs Lab 07/04/14 0520 07/05/14 0520 07/06/14 0355 07/07/14 0414 07/07/14 1027 07/09/14 0320  NA 145 140 142 139 138 138  K 3.6* 3.5* 3.6* 3.6* 3.4* 3.8  CL 107 100 98 97 93* 101  CO2 27 30 30 27   --  26  GLUCOSE 145* 116* 128* 152* 180* 112*  BUN 28* 23 21 31* 31* 22  CREATININE 0.73 0.67 0.79 1.04 1.00 0.89  CALCIUM 9.0 9.0 9.8 9.9  --  9.3   Liver Function Tests:  Recent Labs Lab 07/04/14 0520 07/06/14 0355  AST 31 25  ALT 45 33  ALKPHOS 51 39  BILITOT 0.5 1.0  PROT 5.5* 6.5  ALBUMIN 3.6 4.6   No results for input(s): LIPASE, AMYLASE in the last 168 hours. No results for input(s): AMMONIA in the last 168 hours. CBC:  Recent Labs Lab 07/04/14 0520 07/05/14 0520 07/07/14 0414 07/07/14 1027 07/09/14 0320  WBC 10.9* 9.1 9.1  --  7.4  NEUTROABS 7.5  --   --   --   --   HGB 11.1* 10.4* 13.0 12.9* 11.1*  HCT 32.5* 30.7* 37.5* 38.0* 32.1*  MCV 91.3 88.7 88.4  --  86.8  PLT 199 197 336  --  300   Cardiac Enzymes: No results for input(s): CKTOTAL, CKMB, CKMBINDEX, TROPONINI in the last 168 hours. BNP (last 3 results) No results for input(s): PROBNP in the last 8760  hours. CBG:  Recent Labs Lab 07/09/14 1135 07/09/14 1655 07/09/14 2118 07/10/14 0631 07/10/14 1142  GLUCAP 234* 104* 130* 111* 111*    Micro No results found for this or any previous visit (from the past 240 hour(s)).   Studies: No results found.  Scheduled Meds: . antiseptic oral rinse  7 mL Mouth Rinse QID  . chlorhexidine  15 mL Mouth/Throat BID  . docusate  100 mg Oral BID  . DULoxetine  30 mg Oral Daily  . enoxaparin (LOVENOX) injection  40 mg Subcutaneous Q24H  . famotidine  20 mg Oral Daily  . insulin aspart  0-9 Units Subcutaneous TID WC  . polyethylene glycol  17 g Oral Daily  . pregabalin  200 mg Oral BID   Continuous Infusions:      Time spent: 35 minutes    [facility] A  [facility] Pager [phone] If 7PM-7AM, please contact night-coverage at [email or web address], password [email or web address] 07/10/2014, 12:18 PM  LOS: 10 days

## 2014-07-11 ENCOUNTER — Inpatient Hospital Stay (HOSPITAL_COMMUNITY)
Admission: AD | Admit: 2014-07-11 | Discharge: 2014-07-29 | DRG: 095 | Disposition: A | Discharge status: Home Health | Payer: BC Managed Care – PPO | Source: Ambulatory Visit | Attending: Physical Medicine & Rehabilitation | Admitting: Physical Medicine & Rehabilitation

## 2014-07-11 ENCOUNTER — Encounter (HOSPITAL_COMMUNITY): Payer: Self-pay | Admitting: *Deleted

## 2014-07-11 DIAGNOSIS — F4321 Adjustment disorder with depressed mood: Secondary | ICD-10-CM | POA: Diagnosis present

## 2014-07-11 DIAGNOSIS — G4733 Obstructive sleep apnea (adult) (pediatric): Secondary | ICD-10-CM | POA: Diagnosis present

## 2014-07-11 DIAGNOSIS — N390 Urinary tract infection, site not specified: Secondary | ICD-10-CM | POA: Diagnosis not present

## 2014-07-11 DIAGNOSIS — A0472 Enterocolitis due to Clostridium difficile, not specified as recurrent: Secondary | ICD-10-CM | POA: Diagnosis present

## 2014-07-11 DIAGNOSIS — B9620 Unspecified Escherichia coli [E. coli] as the cause of diseases classified elsewhere: Secondary | ICD-10-CM | POA: Diagnosis not present

## 2014-07-11 DIAGNOSIS — G610 Guillain-Barre syndrome: Secondary | ICD-10-CM | POA: Diagnosis present

## 2014-07-11 DIAGNOSIS — R0600 Dyspnea, unspecified: Secondary | ICD-10-CM

## 2014-07-11 DIAGNOSIS — E785 Hyperlipidemia, unspecified: Secondary | ICD-10-CM | POA: Diagnosis present

## 2014-07-11 DIAGNOSIS — F419 Anxiety disorder, unspecified: Secondary | ICD-10-CM | POA: Diagnosis present

## 2014-07-11 DIAGNOSIS — I10 Essential (primary) hypertension: Secondary | ICD-10-CM | POA: Diagnosis present

## 2014-07-11 DIAGNOSIS — E1142 Type 2 diabetes mellitus with diabetic polyneuropathy: Secondary | ICD-10-CM | POA: Diagnosis present

## 2014-07-11 DIAGNOSIS — A047 Enterocolitis due to Clostridium difficile: Secondary | ICD-10-CM | POA: Diagnosis present

## 2014-07-11 DIAGNOSIS — E1165 Type 2 diabetes mellitus with hyperglycemia: Secondary | ICD-10-CM | POA: Diagnosis present

## 2014-07-11 DIAGNOSIS — K219 Gastro-esophageal reflux disease without esophagitis: Secondary | ICD-10-CM | POA: Diagnosis present

## 2014-07-11 DIAGNOSIS — G8929 Other chronic pain: Secondary | ICD-10-CM | POA: Diagnosis present

## 2014-07-11 DIAGNOSIS — E0840 Diabetes mellitus due to underlying condition with diabetic neuropathy, unspecified: Secondary | ICD-10-CM | POA: Diagnosis present

## 2014-07-11 DIAGNOSIS — D649 Anemia, unspecified: Secondary | ICD-10-CM | POA: Diagnosis present

## 2014-07-11 DIAGNOSIS — G6181 Chronic inflammatory demyelinating polyneuritis: Secondary | ICD-10-CM | POA: Diagnosis present

## 2014-07-11 DIAGNOSIS — F988 Other specified behavioral and emotional disorders with onset usually occurring in childhood and adolescence: Secondary | ICD-10-CM | POA: Diagnosis present

## 2014-07-11 LAB — GLUCOSE, CAPILLARY
Glucose-Capillary: 142 mg/dL — ABNORMAL HIGH (ref 70–99)
Glucose-Capillary: 149 mg/dL — ABNORMAL HIGH (ref 70–99)
Glucose-Capillary: 127 mg/dL — ABNORMAL HIGH (ref 70–99)
Glucose-Capillary: 95 mg/dL (ref 70–99)
Glucose-Capillary: 111 mg/dL — ABNORMAL HIGH (ref 70–99)

## 2014-07-11 LAB — CBC
HCT: 32.3 % — ABNORMAL LOW (ref 39.0–52.0)
Hemoglobin: 10.8 g/dL — ABNORMAL LOW (ref 13.0–17.0)
MCH: 29.4 pg (ref 26.0–34.0)
MCHC: 33.4 g/dL (ref 30.0–36.0)
MCV: 88 fL (ref 78.0–100.0)
PLATELETS: 308 10*3/uL (ref 150–400)
RBC: 3.67 MIL/uL — ABNORMAL LOW (ref 4.22–5.81)
RDW: 12.7 % (ref 11.5–15.5)
WBC: 6.3 10*3/uL (ref 4.0–10.5)

## 2014-07-11 LAB — BASIC METABOLIC PANEL
Anion gap: 14 (ref 5–15)
BUN: 17 mg/dL (ref 6–23)
CALCIUM: 9.3 mg/dL (ref 8.4–10.5)
CO2: 23 mEq/L (ref 19–32)
Chloride: 100 mEq/L (ref 96–112)
Creatinine, Ser: 0.89 mg/dL (ref 0.50–1.35)
GFR calc non Af Amer: >90 mL/min (ref >90)
Glucose, Bld: 110 mg/dL — ABNORMAL HIGH (ref 70–99)
POTASSIUM: 4 meq/L (ref 3.7–5.3)
SODIUM: 137 meq/L (ref 137–147)

## 2014-07-11 MED ORDER — CALCIUM CARBONATE ANTACID 500 MG PO CHEW
2.0000 | CHEWABLE_TABLET | ORAL | Status: DC
Start: 2014-07-11 — End: 2014-07-11
  Filled 2014-07-11 (×2): qty 2

## 2014-07-11 MED ORDER — METRONIDAZOLE 500 MG PO TABS: 500.0000 mg | ORAL_TABLET | Freq: Three times a day (TID) | ORAL | Status: DC

## 2014-07-11 MED ORDER — CHLORHEXIDINE GLUCONATE 0.12 % MT SOLN
15.0000 mL | Freq: Two times a day (BID) | OROMUCOSAL | Status: DC
  Administered 2014-07-12 – 2014-07-29 (×33): 15 mL via OROMUCOSAL
  Filled 2014-07-11 (×38): qty 15

## 2014-07-11 MED ORDER — HEPARIN SODIUM (PORCINE) 1000 UNIT/ML IJ SOLN
1000.0000 [IU] | Freq: Once | INTRAMUSCULAR | Status: DC
  Filled 2014-07-11: qty 1

## 2014-07-11 MED ORDER — ENOXAPARIN SODIUM 40 MG/0.4ML ~~LOC~~ SOLN
40.0000 mg | SUBCUTANEOUS | Status: DC
  Administered 2014-07-12 – 2014-07-29 (×18): 40 mg via SUBCUTANEOUS
  Filled 2014-07-11 (×18): qty 0.4

## 2014-07-11 MED ORDER — LORAZEPAM 0.5 MG PO TABS
0.5000 mg | ORAL_TABLET | ORAL | Status: DC | PRN
  Administered 2014-07-11 – 2014-07-17 (×13): 0.5 mg via ORAL
  Filled 2014-07-11 (×13): qty 1

## 2014-07-11 MED ORDER — GUAIFENESIN-DM 100-10 MG/5ML PO SYRP: 5.0000 mL | ORAL_SOLUTION | Freq: Four times a day (QID) | ORAL | Status: DC | PRN

## 2014-07-11 MED ORDER — METRONIDAZOLE 500 MG PO TABS
500.0000 mg | ORAL_TABLET | Freq: Three times a day (TID) | ORAL | Status: DC
  Administered 2014-07-12 – 2014-07-24 (×38): 500 mg via ORAL
  Filled 2014-07-11 (×43): qty 1

## 2014-07-11 MED ORDER — DIPHENHYDRAMINE HCL 12.5 MG/5ML PO ELIX
12.5000 mg | ORAL_SOLUTION | Freq: Four times a day (QID) | ORAL | Status: DC | PRN
  Administered 2014-07-12: 25 mg via ORAL
  Filled 2014-07-11: qty 10

## 2014-07-11 MED ORDER — TRAZODONE HCL 50 MG PO TABS
25.0000 mg | ORAL_TABLET | Freq: Every evening | ORAL | Status: DC | PRN
  Administered 2014-07-12 – 2014-07-26 (×12): 50 mg via ORAL
  Filled 2014-07-11 (×12): qty 1

## 2014-07-11 MED ORDER — FAMOTIDINE 20 MG PO TABS
20.0000 mg | ORAL_TABLET | Freq: Every day | ORAL | Status: DC
  Administered 2014-07-12 – 2014-07-18 (×7): 20 mg via ORAL
  Filled 2014-07-11 (×8): qty 1

## 2014-07-11 MED ORDER — METHOCARBAMOL 500 MG PO TABS
500.0000 mg | ORAL_TABLET | Freq: Four times a day (QID) | ORAL | Status: DC | PRN
  Administered 2014-07-11 – 2014-07-29 (×24): 500 mg via ORAL
  Filled 2014-07-11 (×27): qty 1

## 2014-07-11 MED ORDER — BISACODYL 10 MG RE SUPP
10.0000 mg | Freq: Every day | RECTAL | Status: DC | PRN
  Administered 2014-07-14: 10 mg via RECTAL
  Filled 2014-07-11: qty 1

## 2014-07-11 MED ORDER — LORAZEPAM 2 MG/ML IJ SOLN: 0.5000 mg | INTRAMUSCULAR | Status: DC | PRN

## 2014-07-11 MED ORDER — ONDANSETRON HCL 4 MG/2ML IJ SOLN: 4.0000 mg | Freq: Four times a day (QID) | INTRAMUSCULAR | Status: DC | PRN

## 2014-07-11 MED ORDER — PREGABALIN 50 MG PO CAPS
200.0000 mg | ORAL_CAPSULE | Freq: Two times a day (BID) | ORAL | Status: DC
  Administered 2014-07-11 – 2014-07-23 (×25): 200 mg via ORAL
  Filled 2014-07-11 (×25): qty 4

## 2014-07-11 MED ORDER — ONDANSETRON HCL 4 MG PO TABS
4.0000 mg | ORAL_TABLET | Freq: Four times a day (QID) | ORAL | Status: DC | PRN
  Administered 2014-07-13 – 2014-07-24 (×4): 4 mg via ORAL
  Filled 2014-07-11 (×4): qty 1

## 2014-07-11 MED ORDER — DIPHENHYDRAMINE HCL 25 MG PO CAPS: 25.0000 mg | ORAL_CAPSULE | Freq: Four times a day (QID) | ORAL | Status: DC | PRN

## 2014-07-11 MED ORDER — ACETAMINOPHEN 325 MG PO TABS: 650.0000 mg | ORAL_TABLET | ORAL | Status: DC | PRN

## 2014-07-11 MED ORDER — POLYETHYLENE GLYCOL 3350 17 G PO PACK
17.0000 g | PACK | Freq: Every day | ORAL | Status: DC
  Administered 2014-07-14 – 2014-07-29 (×15): 17 g via ORAL
  Filled 2014-07-11 (×19): qty 1

## 2014-07-11 MED ORDER — ACETAMINOPHEN 325 MG PO TABS
325.0000 mg | ORAL_TABLET | ORAL | Status: DC | PRN
  Administered 2014-07-12 – 2014-07-20 (×5): 650 mg via ORAL
  Administered 2014-07-22: 325 mg via ORAL
  Administered 2014-07-23 – 2014-07-24 (×2): 650 mg via ORAL
  Filled 2014-07-11 (×8): qty 2

## 2014-07-11 MED ORDER — INSULIN ASPART 100 UNIT/ML ~~LOC~~ SOLN
0.0000 [IU] | Freq: Three times a day (TID) | SUBCUTANEOUS | Status: DC
  Administered 2014-07-12: 2 [IU] via SUBCUTANEOUS
  Administered 2014-07-14 – 2014-07-23 (×6): 1 [IU] via SUBCUTANEOUS
  Administered 2014-07-27: 2 [IU] via SUBCUTANEOUS
  Administered 2014-07-28 (×2): 1 [IU] via SUBCUTANEOUS

## 2014-07-11 MED ORDER — FLEET ENEMA 7-19 GM/118ML RE ENEM: 1.0000 | ENEMA | Freq: Once | RECTAL | Status: AC | PRN

## 2014-07-11 MED ORDER — CETYLPYRIDINIUM CHLORIDE 0.05 % MT LIQD
7.0000 mL | Freq: Two times a day (BID) | OROMUCOSAL | Status: DC
  Administered 2014-07-11 – 2014-07-29 (×33): 7 mL via OROMUCOSAL

## 2014-07-11 MED ORDER — DOCUSATE SODIUM 50 MG/5ML PO LIQD
100.0000 mg | Freq: Two times a day (BID) | ORAL | Status: DC
  Administered 2014-07-12: 100 mg via ORAL
  Filled 2014-07-11 (×5): qty 10

## 2014-07-11 MED ORDER — ACD FORMULA A 0.73-2.45-2.2 GM/100ML VI SOLN
500.0000 mL | Status: DC
  Filled 2014-07-11: qty 500

## 2014-07-11 MED ORDER — DULOXETINE HCL 30 MG PO CPEP
30.0000 mg | ORAL_CAPSULE | Freq: Every day | ORAL | Status: DC
  Administered 2014-07-12 – 2014-07-25 (×14): 30 mg via ORAL
  Filled 2014-07-11 (×15): qty 1

## 2014-07-11 MED ORDER — ALUM & MAG HYDROXIDE-SIMETH 200-200-20 MG/5ML PO SUSP
30.0000 mL | ORAL | Status: DC | PRN
  Administered 2014-07-12 – 2014-07-17 (×7): 30 mL via ORAL
  Filled 2014-07-11 (×8): qty 30

## 2014-07-11 NOTE — H&P (View-Only) (Signed)
Physical Medicine and Rehabilitation Admission H&P    Chief Complaint  Patient presents with  . 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 male with history of DM type 2 with peripheral neuropathy, anxiety disorder, left ankle fracture 02/2014, back and shoulder pain with mild lower extremity weakness since Oct 1st. He was evaluated by ED multiple times diagnosis of PNA as well as urinary retention, N/V with decrease in intake. MRI thoracic spine with left paracentral disc extrusion at T7-8 with mild cord flattening but no abnormal cord signal or compression deformity and underwent esophageal dilatation and was discharged to SNF on 11/18. He was readmitted on 06/30/14 with rapidly ascending paralysis and severe pain. 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 He was started on IVIG and intubated due to concerns of impending respiratory failure. He tolerated extubation on 11/25 and respiratory status stable. Swallow evaluation done and patient started on dysphagia 2, thin liquids due to generalized weakness. He has had issues with anxiety as well as paranoia requiring IV ativan. Pain has been controlled on IV fentanyl. He developed diarrhea and stool positive for cdiff therefore started on flagyl yesterday. Therapy evaluations done on 11/25 and CIR recommended for follow up therapy.    Review of Systems  Constitutional: Negative for fever, chills and weight loss.  HENT: Negative for hearing loss.   Eyes: Negative.   Respiratory: Negative for cough and hemoptysis.   Cardiovascular: Negative for chest pain and palpitations.  Gastrointestinal: Positive for diarrhea. Negative for heartburn and nausea.  Genitourinary: Negative for dysuria and urgency.  Musculoskeletal: Positive for myalgias and back pain.   Skin: Negative for itching and rash.  Neurological: Positive for dizziness, tingling, sensory change and focal weakness. Negative for headaches.  Psychiatric/Behavioral: The patient is nervous/anxious.       Past Medical History  Diagnosis Date  . COLONIC POLYPS 02/01/2008  . DIABETES MELLITUS, TYPE II 05/20/2010  . HYPERLIPIDEMIA 10/09/2008  . ANXIETY DEPRESSION 02/01/2008  . ERECTILE DYSFUNCTION 10/09/2008  . ADD 10/09/2008  . SLEEP APNEA, OBSTRUCTIVE 02/01/2008  . MORTON'S NEUROMA 05/20/2010  . PERIPHERAL NEUROPATHY 05/20/2010  . Other specified forms of hearing loss 06/27/2009  . HYPERTENSION 10/09/2008  . HEMORRHOIDS 02/01/2008  . ALLERGIC RHINITIS 10/09/2008  . Stricture and stenosis of esophagus 02/02/2008  . GERD 02/01/2008  . HIATAL HERNIA 02/01/2008  . ERECTILE DYSFUNCTION, ORGANIC 05/20/2010  . WRIST PAIN, LEFT 12/05/2009  . FOOT PAIN, LEFT 05/20/2010  . PERIPHERAL EDEMA 05/20/2010  . DYSPNEA 03/12/2010  . Abdominal pain, unspecified site 01/19/2009  . Abdominal pain, left lower quadrant 06/06/2010  . Type II or unspecified type diabetes mellitus without mention of complication, uncontrolled 11/14/2010    Past Surgical History  Procedure Laterality Date  . Carpal tunnel release    . Rotator cuff repair    . Esophageal dilation  july 2009  . Eye surgery    . Esophagogastroduodenoscopy N/A 06/27/2014    Procedure: ESOPHAGOGASTRODUODENOSCOPY (EGD);  Surgeon: Lafayette Dragon, MD;  Location: [facility];  Service: Endoscopy;  Laterality: N/A;    Family History  Problem Relation Age of Onset  . Diabetes Mother   . Heart disease Mother   . Hyperlipidemia Mother   . Depression Mother   . Diabetes Brother   . Colon cancer Neg  Hx     Social History:  reports that he has never smoked. He has never used smokeless tobacco. He reports that he drinks alcohol. He reports that he uses illicit drugs (Methaqualone).     Allergies  Allergen Reactions  . Codeine Itching    REACTION:  Itching  . Metformin And Related Other (See Comments)    GI upset, diarrhea    Medications Prior to Admission  Medication Sig Dispense Refill  . allopurinol (ZYLOPRIM) 100 MG tablet Take 2 tablets (200 mg total) by mouth daily. 30 tablet 6  . amLODipine (NORVASC) 10 MG tablet Take 1 tablet (10 mg total) by mouth daily. 90 tablet 3  . atorvastatin (LIPITOR) 10 MG tablet Take 10 mg by mouth daily.    . clonazePAM (KLONOPIN) 0.5 MG tablet Take 1 tablet (0.5 mg total) by mouth 2 (two) times daily as needed for anxiety. 30 tablet 0  . colchicine 0.6 MG tablet Take 1 tablet (0.6 mg total) by mouth 2 (two) times daily. 10 tablet 0  . DULoxetine (CYMBALTA) 30 MG capsule Take 30 mg by mouth daily.     Marland Kitchen glipiZIDE (GLUCOTROL XL) 2.5 MG 24 hr tablet Take 1 tablet (2.5 mg total) by mouth daily with breakfast. 90 tablet 3  . hydrALAZINE (APRESOLINE) 10 MG tablet Take 1 tablet (10 mg total) by mouth 3 (three) times daily. 90 tablet 1  . levofloxacin (LEVAQUIN) 500 MG tablet Take 1 tablet (500 mg total) by mouth daily. 5 tablet 0  . meloxicam (MOBIC) 15 MG tablet Take 1 tablet (15 mg total) by mouth daily. 30 tablet 0  . methylphenidate 54 MG PO CR tablet Take 54 mg by mouth 2 (two) times daily.    . metoCLOPramide (REGLAN) 5 MG tablet Take 1 tablet (5 mg total) by mouth 4 (four) times daily -  before meals and at bedtime. 90 tablet 1  . NUCYNTA 100 MG TABS Take 1 tablet by mouth 3 (three) times daily.     Marland Kitchen oxyCODONE-acetaminophen (PERCOCET) 7.5-325 MG per tablet Take 1 tablet by mouth every 6 (six) hours as needed for pain. 20 tablet 0  . pantoprazole (PROTONIX) 40 MG tablet Take 1 tablet (40 mg total) by mouth daily. 30 tablet 0  . pregabalin (LYRICA) 200 MG capsule Take 200 mg by mouth 2 (two) times daily.    Marland Kitchen glucose blood (FREESTYLE TEST STRIPS) test strip Use as instructed 100 each 12  . ondansetron (ZOFRAN) 4 MG tablet Take 1 tablet (4 mg total) by mouth 2 (two) times daily. (Patient not taking:  Reported on 06/30/2014) 60 tablet 1  . ondansetron (ZOFRAN) 4 MG tablet Take 1 tablet (4 mg total) by mouth every 8 (eight) hours as needed for nausea or vomiting. (Patient not taking: Reported on 06/30/2014) 90 tablet 0    Home: Home Living Family/patient expects to be discharged to:: Inpatient rehab Living Arrangements: Spouse/significant other   Functional History: Prior Function Level of Independence: Needs assistance ADL's / Homemaking Assistance Needed: unsure of exact baseline. Pt was at SNF PTA and he states he was working with therapy. Comments: Unclear true baseline as pt has been having progressive weakness and increasing frequency of falls per chart.    Functional Status:  Mobility: Bed Mobility Overal bed mobility: Needs Assistance Bed Mobility: Rolling, Sidelying to Sit Rolling: Supervision Sidelying to sit: Min guard Supine to sit: Mod assist, HOB elevated Sit to supine: Min assist General bed mobility comments: with rails; heavy reliance on  UEs and rails Transfers Overall transfer level: Needs assistance Equipment used: Rolling walker (2 wheeled) Transfers: Sit to/from Stand, Stand Pivot Transfers Sit to Stand: +2 safety/equipment, +2 physical assistance, Min assist Stand pivot transfers: Max assist, +2 physical assistance, +2 safety/equipment General transfer comment: x2 to stand; as pt comes to full upright position, he immediately thrusts hips/pelvis forward with anterior LOB requiring max assist to recover. Pt reports he feels more secure with hips anterior to feet and is afraid knees will buckle when he is centered over his feet Ambulation/Gait Ambulation/Gait assistance: Max assist, +2 physical assistance Ambulation Distance (Feet): 4 Feet Assistive device: Rolling walker (2 wheeled) Gait Pattern/deviations: Step-to pattern, Decreased stride length, Decreased weight shift to right, Decreased weight shift to left, Steppage, Ataxic (anterior lean) General Gait  Details: Pt with better placement of his legs when focused on walking forward in straight path (chair then pulled up behind him with no pivoting/turning)    ADL: ADL Overall ADL's : Needs assistance/impaired Eating/Feeding: Moderate assistance, Sitting Eating/Feeding Details (indicate cue type and reason): sitting EOB. Assist to help scoop pudding onto spoon and control for ataxic movements at times. Pt encouraged to stabilize R UE against body for better control but pt had trouble doing so. Use tray table and L elbow propped on table seated at EOB. May benefit from built up handles.  Grooming: Wash/dry face, Sitting, Moderate assistance Grooming Details (indicate cue type and reason): intermittently for sitting balance and to help with control of washcloth over face so he didnt rub his eye too hard and for thoroughness.  Upper Body Bathing: Maximal assistance, Sitting Lower Body Bathing: Maximal assistance, Bed level Upper Body Dressing : Moderate assistance, Bed level Lower Body Dressing: Total assistance, Bed level Lower Body Dressing Details (indicate cue type and reason): with socks. General ADL Comments: Pt able to sit at EOB for approximately 15 minutes with min guard to min assist for balance. tends to lean posteriorly and needs min assist to correct. Pt ate several bites of pudding and washed face at EOB. Worked on trunk control and posture as pt also tends to flex at neck and trunk with activity.   Cognition: Cognition Overall Cognitive Status: No family/caregiver present to determine baseline cognitive functioning Orientation Level: Oriented X4 Cognition Arousal/Alertness: Awake/alert Behavior During Therapy: Restless, Impulsive Overall Cognitive Status: No family/caregiver present to determine baseline cognitive functioning Area of Impairment: Safety/judgement Orientation Level: Disoriented to, Situation (pt appropriately found calendar in room for day and date.  ) Current  Attention Level: Focused Memory: Decreased short-term memory Following Commands: Follows one step commands with increased time Safety/Judgement: Decreased awareness of safety, Decreased awareness of deficits Awareness: Emergent Problem Solving: Slow processing, Requires verbal cues, Requires tactile cues General Comments: pt tending to perseverate on washing his face and clearing objects off his tray table. Needs cues to stay focused on task and for completion of task in order to start another task.   Physical Exam: Blood pressure 125/85, pulse 83, temperature 97.6 F (36.4 C), temperature source Axillary, resp. rate 17, height _0  (1.753 m), weight 81.693 kg (180 lb 1.6 oz), SpO2 100 %. Physical Exam  Constitutional: He is oriented to person, place, and time. He appears well-developed. No distress.  HENT:  Head: Normocephalic and atraumatic.  Eyes: Conjunctivae and EOM are normal. Pupils are equal, round, and reactive to light.  Neck: No JVD present. No tracheal deviation present. No thyromegaly present.  Cardiovascular: Normal rate and regular rhythm.   Respiratory: No  respiratory distress. He has no wheezes. He has no rales. He exhibits no tenderness.  GI: He exhibits no distension. There is no tenderness. There is no rebound and no guarding.  Musculoskeletal: He exhibits no edema or tenderness.  Lymphadenopathy:    He has no cervical adenopathy.  Neurological: He is alert and oriented to person, place, and time.  Diffuse sensory loss distal greater than proximal in stocking glove distribution, inconsistent in all 4 limbs, lower more affected than uppers. Motor: 4/5 deltoid, bicep, tricep, hands. LE: 3/5 hf, 3+ ke and 4/5 ankles--not consistent with effort, tended to be anxious. DTR's 1+ to absent throughout  Skin: Skin is warm. He is not diaphoretic.  Psychiatric:  Anxious, frequently needs to be redirected.    Results for orders placed or performed during the hospital encounter  of 06/30/14 (from the past 48 hour(s))  Glucose, capillary     Status: Abnormal   Collection Time: 07/09/14  4:55 PM  Result Value Ref Range   Glucose-Capillary 104 (H) 70 - 99 mg/dL  Glucose, capillary     Status: Abnormal   Collection Time: 07/09/14  9:18 PM  Result Value Ref Range   Glucose-Capillary 130 (H) 70 - 99 mg/dL   Comment 1 Notify RN    Comment 2 Documented in Chart   Glucose, capillary     Status: Abnormal   Collection Time: 07/10/14  6:31 AM  Result Value Ref Range   Glucose-Capillary 111 (H) 70 - 99 mg/dL  Clostridium Difficile by PCR     Status: Abnormal   Collection Time: 07/10/14 11:18 AM  Result Value Ref Range   C difficile by pcr POSITIVE (A) NEGATIVE    Comment: CRITICAL RESULT CALLED TO, READ BACK BY AND VERIFIED WITH: VASIADIAS RN 16:50 07/10/14 ([email or web address])   Glucose, capillary     Status: Abnormal   Collection Time: 07/10/14 11:42 AM  Result Value Ref Range   Glucose-Capillary 111 (H) 70 - 99 mg/dL   Comment 1 Documented in Chart   Glucose, capillary     Status: None   Collection Time: 07/10/14  4:32 PM  Result Value Ref Range   Glucose-Capillary 91 70 - 99 mg/dL   Comment 1 Documented in Chart   Glucose, capillary     Status: Abnormal   Collection Time: 07/10/14  9:53 PM  Result Value Ref Range   Glucose-Capillary 142 (H) 70 - 99 mg/dL   Comment 1 Notify RN    Comment 2 Documented in Chart   Basic metabolic panel     Status: Abnormal   Collection Time: 07/11/14  4:58 AM  Result Value Ref Range   Sodium 137 137 - 147 mEq/L   Potassium 4.0 3.7 - 5.3 mEq/L   Chloride 100 96 - 112 mEq/L   CO2 23 19 - 32 mEq/L   Glucose, Bld 110 (H) 70 - 99 mg/dL   BUN 17 6 - 23 mg/dL   Creatinine, Ser 0.89 0.50 - 1.35 mg/dL   Calcium 9.3 8.4 - 10.5 mg/dL   GFR calc non Af Amer >90 >90 mL/min   GFR calc Af Amer >90 >90 mL/min    Comment: (NOTE) The eGFR has been calculated using the CKD EPI equation. This calculation has not been validated in all clinical  situations. eGFR's persistently <90 mL/min signify possible Chronic Kidney Disease.    Anion gap 14 5 - 15  CBC     Status: Abnormal   Collection Time: 07/11/14  4:58 AM  Result Value Ref Range   WBC 6.3 4.0 - 10.5 K/uL   RBC 3.67 (L) 4.22 - 5.81 MIL/uL   Hemoglobin 10.8 (L) 13.0 - 17.0 g/dL   HCT 32.3 (L) 39.0 - 52.0 %   MCV 88.0 78.0 - 100.0 fL   MCH 29.4 26.0 - 34.0 pg   MCHC 33.4 30.0 - 36.0 g/dL   RDW 12.7 11.5 - 15.5 %   Platelets 308 150 - 400 K/uL  Glucose, capillary     Status: Abnormal   Collection Time: 07/11/14  6:36 AM  Result Value Ref Range   Glucose-Capillary 149 (H) 70 - 99 mg/dL   Comment 1 Documented in Chart    Comment 2 Notify RN   Glucose, capillary     Status: Abnormal   Collection Time: 07/11/14 11:38 AM  Result Value Ref Range   Glucose-Capillary 127 (H) 70 - 99 mg/dL   Comment 1 Notify RN    Comment 2 Documented in Chart    No results found.     Medical Problem List and Plan: 1. Functional deficits secondary to Guillain Barre Syndrome (vs CIDP) in patient with chronic peipheral neuropathy 2.  DVT Prophylaxis/Anticoagulation: Pharmaceutical: Lovenox 3. Chronic Pain Management:  Was on nucynta 100 mg tid PTA.   -can resume lyrica as it's on formulary  -lyrica and cymbalta 4. Anxiety/depression/Mood: Continue Cymbalta for mood stabilization. Will continue ativan po prn for anxiety. LCSW to follow for evaluation and support.  5. Neuropsych: This patient is  capable of making decisions on his own behalf. 6. Skin/Wound Care:  Routine pressure relief measures 7. Fluids/Electrolytes/Nutrition: Monitor I/O. Check lytes in am to monitor for recurrent hypokalemia 8. HTN: Blood pressure controlled off medications.  9. Peripheral neuropathy: Continue lyrica bid with Cymbalta.   10. DM type 2: Continue to check BS ac/hs basis with SSI for elevated BS. May need glucotrol resumed as po intake improves.   11. Anemia: Will recheck H/H in am. Monitor stool  guaiacs.  10 OSA: 11. GERD s/p recent dilatation of esophageal stricture:  12. C diff colitis: Flagyl D# 2/14     Post Admission Physician Evaluation: 1. Functional deficits secondary  to GBS/CIDP. 2. Patient is admitted to receive collaborative, interdisciplinary care between the physiatrist, rehab nursing staff, and therapy team. 3. Patient's level of medical complexity and substantial therapy needs in context of that medical necessity cannot be provided at a lesser intensity of care such as a SNF. 4. Patient has experienced substantial functional loss from his/her baseline which was documented above under the "Functional History" and "Functional Status" headings.  Judging by the patient's diagnosis, physical exam, and functional history, the patient has potential for functional progress which will result in measurable gains while on inpatient rehab.  These gains will be of substantial and practical use upon discharge  in facilitating mobility and self-care at the household level. 5. Physiatrist will provide 24 hour management of medical needs as well as oversight of the therapy plan/treatment and provide guidance as appropriate regarding the interaction of the two. 6. 24 hour rehab nursing will assist with bladder management, bowel management, safety, skin/wound care, disease management, medication administration, pain management and patient education  and help integrate therapy concepts, techniques,education, etc. 7. PT will assess and treat for/with: Lower extremity strength, range of motion, stamina, balance, functional mobility, safety, adaptive techniques and equipment, NMR, pain mgt, activity tolerance.   Goals are: supervision. 8. OT will assess and treat for/with: ADL's, functional mobility, safety, upper  extremity strength, adaptive techniques and equipment, NMR, balance, pain mgt, ego support.   Goals are: supervision. Therapy may proceed with showering this patient. 9. SLP will assess  and treat for/with: n/a.  Goals are: n/a. 10. Case Management and Social Worker will assess and treat for psychological issues and discharge planning. 11. Team conference will be held weekly to assess progress toward goals and to determine barriers to discharge. 12. Patient will receive at least 3 hours of therapy per day at least 5 days per week. 13. ELOS: 16-20 days       14. Prognosis:  good     Meredith Staggers, MD, [facility] 07/11/2014   07/11/2014

## 2014-07-11 NOTE — Plan of Care (Signed)
Problem: Phase I Progression Outcomes Goal: Pain controlled with appropriate interventions Outcome: Progressing Goal: OOB as tolerated unless otherwise ordered Outcome: Progressing Goal: Initial discharge plan identified Outcome: Completed/Met Date Met:  07/11/14 Goal: Voiding-avoid urinary catheter unless indicated Outcome: Completed/Met Date Met:  07/11/14 Goal: Hemodynamically stable Outcome: Completed/Met Date Met:  07/11/14 Goal: Other Phase I Outcomes/Goals Outcome: Progressing

## 2014-07-11 NOTE — PMR Pre-admission (Signed)
PMR Admission Coordinator Pre-Admission Assessment  Patient: Frank [name] is an 58 y.o., male MRN: [id number] DOB: 08/08/1956 Height: 5' 9" (175.3 cm) Weight: 81.693 kg (180 lb 1.6 oz)              Insurance Information HMO:      PPO: Yes     PCP:       IPA:       80/20:       OTHER:   PRIMARY: BCBS state health plan      Policy#:  [id number]      Subscriber: Jonnatan Bostelman CM Name: Janet Hunker      Phone#: [phone]     Fax#: [phone] Pre-Cert#: [id number]      Employer: [employer] Benefits:  Phone #: [phone]     Name: Megan Eff. Date: 08/11/13     Deduct: $700 (met all)      Out of Pocket Max: $3210 (met all)      Life Max: unlimited CIR: 100%      SNF: 100% with 100 days max Outpatient: 100%     Co-Pay: none Home Health: 100%      Co-Pay: none DME: 100%     Co-Pay: none Providers: in network  Emergency Contact Information Contact Information    Name Relation Home Work Mobile   Haque,Frankie Brother [phone]     Cook,Mike Significant other [phone]     Current Medical History  Patient Admitting Diagnosis:  GBS  History of Present Illness: A 58 y.o. male with history of DM type 2 with peripheral neuropathy, anxiety disorder, left ankle fracture 02/2014, back and shoulder pain with mild lower extremity weakness since Oct 1st. He was evaluated by ED multiple times diagnosis of PNA as well as urinary retention, N/V with decrease in intake. MRI thoracic spine with left paracentral disc extrusion at T7-8 with mild cord flattening but no abnormal cord signal or compression deformity and underwent esophageal dilatation and was discharged to SNF on 11/18. He was readmitted on 06/30/14 with rapidly ascending paralysis and severe pain. 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 He was started on IVIG and intubated due to concerns of impending respiratory failure. He tolerated extubation on 11/25 and respiratory status stable. Swallow evaluation done and patient started on dysphagia 2, thin liquids due to generalized weakness. He has had issues with anxiety as well as paranoia. Therapy evaluations done on 11/25 and CIR recommended for follow up therapy.      Past Medical History  Past Medical History  Diagnosis Date  . COLONIC POLYPS 02/01/2008  . DIABETES MELLITUS, TYPE II 05/20/2010  . HYPERLIPIDEMIA 10/09/2008  . ANXIETY DEPRESSION 02/01/2008  . ERECTILE DYSFUNCTION 10/09/2008  . ADD 10/09/2008  . SLEEP APNEA, OBSTRUCTIVE 02/01/2008  . MORTON'S NEUROMA 05/20/2010  . PERIPHERAL NEUROPATHY 05/20/2010  . Other specified forms of hearing loss 06/27/2009  . HYPERTENSION 10/09/2008  . HEMORRHOIDS 02/01/2008  . ALLERGIC RHINITIS 10/09/2008  . Stricture and stenosis of esophagus 02/02/2008  . GERD 02/01/2008  . HIATAL HERNIA 02/01/2008  . ERECTILE DYSFUNCTION, ORGANIC 05/20/2010  . WRIST PAIN, LEFT 12/05/2009  . FOOT PAIN, LEFT 05/20/2010  . PERIPHERAL EDEMA 05/20/2010  . DYSPNEA 03/12/2010  . Abdominal pain, unspecified site 01/19/2009  . Abdominal pain, left lower quadrant 06/06/2010  . Type II or unspecified   type diabetes mellitus without mention of complication, uncontrolled 11/14/2010    Family History  family history includes Depression in his mother; Diabetes in his brother and mother; Heart disease in his mother; Hyperlipidemia in his mother. There is no history of Colon cancer.  Prior Rehab/Hospitalizations:  Was at [facility] for 2 nights, then back to acute hospital.   Current Medications  Current facility-administered medications: antiseptic oral rinse (CPC / CETYLPYRIDINIUM CHLORIDE 0.05%) solution 7 mL, 7 mL, Mouth Rinse, QID, Wesam G Yacoub, MD, 7 mL at 07/11/14 1333;  bisacodyl (DULCOLAX) suppository 10 mg, 10 mg, Rectal, Daily PRN, Patrick E Wright,  MD, 10 mg at 07/06/14 1351;  chlorhexidine (PERIDEX) 0.12 % solution 15 mL, 15 mL, Mouth/Throat, BID, Wesam G Yacoub, MD, 15 mL at 07/10/14 2018 docusate (COLACE) 50 MG/5ML liquid 100 mg, 100 mg, Oral, BID, Daniel J Feinstein, MD, 100 mg at 07/08/14 0932;  DULoxetine (CYMBALTA) DR capsule 30 mg, 30 mg, Oral, Daily, Thomas Michael Callahan, NP, 30 mg at 07/11/14 1058;  enoxaparin (LOVENOX) injection 40 mg, 40 mg, Subcutaneous, Q24H, Peter Justin Sumner, DO, 40 mg at 07/11/14 1058;  famotidine (PEPCID) tablet 20 mg, 20 mg, Oral, Daily, Jessica Brown Millen, RPH, 20 mg at 07/11/14 1058 fentaNYL (SUBLIMAZE) injection 12.5-50 mcg, 12.5-50 mcg, Intravenous, Q2H PRN, Emilliano B McQuaid, MD, 50 mcg at 07/11/14 1328;  heparin injection 1,000 Units, 1,000 Units, Intravenous, PRN, William S Minor, NP, 2,400 Units at 06/30/14 1201;  insulin aspart (novoLOG) injection 0-9 Units, 0-9 Units, Subcutaneous, TID WC, Mutaz Elmahi, MD, 1 Units at 07/11/14 1236 labetalol (NORMODYNE,TRANDATE) injection 10 mg, 10 mg, Intravenous, Q5 min PRN, McNeill Kirkpatrick, MD, 10 mg at 07/07/14 0134;  LORazepam (ATIVAN) injection 1 mg, 1 mg, Intravenous, Q4H PRN, Rakesh Alva V, MD, 1 mg at 07/10/14 2123;  metroNIDAZOLE (FLAGYL) tablet 500 mg, 500 mg, Oral, 3 times per day, Mutaz Elmahi, MD, 500 mg at 07/11/14 1332 ondansetron (ZOFRAN) injection 4 mg, 4 mg, Intravenous, Q6H PRN, Peter Justin Sumner, DO, 4 mg at 06/30/14 0932;  polyethylene glycol (MIRALAX / GLYCOLAX) packet 17 g, 17 g, Oral, Daily, Daniel J Feinstein, MD, Stopped at 07/06/14 0932;  pregabalin (LYRICA) capsule 200 mg, 200 mg, Oral, BID, Thomas Michael Callahan, NP, 200 mg at 07/11/14 1058  Patients Current Diet: Diet Carb Modified Diet - low sodium heart healthy  Precautions / Restrictions Precautions Precautions: Fall Precaution Comments: h/o multiple falls Restrictions Weight Bearing Restrictions: No   Prior Activity Level Limited Community (1-2x/wk): Has been  limited since ankle fx 07/15.  Was able to get around the house using a walker PTA.   Home Assistive Devices / Equipment Home Assistive Devices/Equipment: CPAP, Eyeglasses  Prior Functional Level Prior Function Level of Independence: Needs assistance ADL's / Homemaking Assistance Needed: unsure of exact baseline. Pt was at SNF PTA and he states he was working with therapy. Comments: Unclear true baseline as pt has been having progressive weakness and increasing frequency of falls per chart.    Current Functional Level Cognition  Overall Cognitive Status: No family/caregiver present to determine baseline cognitive functioning Current Attention Level: Focused Orientation Level: Oriented X4 Following Commands: Follows one step commands with increased time Safety/Judgement: Decreased awareness of safety, Decreased awareness of deficits General Comments: pt tending to perseverate on washing his face and clearing objects off his tray table. Needs cues to stay focused on task and for completion of task in order to start another task.     Extremity Assessment (includes Sensation/Coordination)    Upper Extremity Assessment: Defer to OT evaluation Lower Extremity Assessment: RLE deficits/detail;LLE deficits/detail RLE Deficits / Details: Strength grossly 3/5 with dyskinetic type movements.  LLE Deficits / Details: Strength grossly 3/5 with dyskinetic type movements.  Cervical / Trunk Assessment: Other exceptions   ADLs  Overall ADL's : Needs assistance/impaired Eating/Feeding: Moderate assistance, Sitting Eating/Feeding Details (indicate cue type and reason): sitting EOB. Assist to help scoop pudding onto spoon and control for ataxic movements at times. Pt encouraged to stabilize R UE against body for better control but pt had trouble doing so. Use tray table and L elbow propped on table seated at EOB. May benefit from built up handles.  Grooming: Wash/dry face, Sitting, Moderate  assistance Grooming Details (indicate cue type and reason): intermittently for sitting balance and to help with control of washcloth over face so he didnt rub his eye too hard and for thoroughness.  Upper Body Bathing: Maximal assistance, Sitting Lower Body Bathing: Maximal assistance, Bed level Upper Body Dressing : Moderate assistance, Bed level Lower Body Dressing: Total assistance, Bed level Lower Body Dressing Details (indicate cue type and reason): with socks. General ADL Comments: Pt able to sit at EOB for approximately 15 minutes with min guard to min assist for balance. tends to lean posteriorly and needs min assist to correct. Pt ate several bites of pudding and washed face at EOB. Worked on trunk control and posture as pt also tends to flex at neck and trunk with activity.     Mobility  Overal bed mobility: Needs Assistance Bed Mobility: Rolling, Sidelying to Sit Rolling: Supervision Sidelying to sit: Min guard Supine to sit: Mod assist, HOB elevated Sit to supine: Min assist General bed mobility comments: with rails; heavy reliance on UEs and rails    Transfers  Overall transfer level: Needs assistance Equipment used: Rolling walker (2 wheeled) Transfers: Sit to/from Stand, Stand Pivot Transfers Sit to Stand: +2 safety/equipment, +2 physical assistance, Min assist Stand pivot transfers: Max assist, +2 physical assistance, +2 safety/equipment General transfer comment: x2 to stand; as pt comes to full upright position, he immediately thrusts hips/pelvis forward with anterior LOB requiring max assist to recover. Pt reports he feels more secure with hips anterior to feet and is afraid knees will buckle when he is centered over his feet    Ambulation / Gait / Stairs / Wheelchair Mobility  Ambulation/Gait Ambulation/Gait assistance: Max assist, +2 physical assistance Ambulation Distance (Feet): 4 Feet Assistive device: Rolling walker (2 wheeled) Gait Pattern/deviations: Step-to  pattern, Decreased stride length, Decreased weight shift to right, Decreased weight shift to left, Steppage, Ataxic (anterior lean) General Gait Details: Pt with better placement of his legs when focused on walking forward in straight path (chair then pulled up behind him with no pivoting/turning)    Posture / Balance Dynamic Sitting Balance Sitting balance - Comments: close supervision due to impulsivity    Special needs/care consideration BiPAP/CPAP Has sleep apnea, but not on CPAP CPM No Continuous Drip IV No Dialysis No        Life Vest No Oxygen Currently on O2 [place] Special Bed No Trach Size No Wound Vac (area) No      Skin States bottom, rectal area is sore.                               Bowel mgmt: Incontinent of stool, now with C-Diff colitis Bladder mgmt: Voiding in urinal Diabetic mgmt Yes, on oral  medication at home    Previous Home Environment Living Arrangements: Spouse/significant other [place]: No  Discharge Living Setting Plans for Discharge Living Setting: House, Lives with (comment) Type of Home at Discharge: House Discharge Home Layout: One level Discharge Home Access: Stairs to enter Entrance Stairs-Rails: Right, Left, Can reach both Entrance Stairs-Number of Steps: 3 step entry Does the patient have any problems obtaining your medications?: Yes (Describe)  Social/Family/Support Systems Patient Roles: Partner (Has a partner/significant other.) Contact Information: Danne Baxter - significant other Anticipated Caregiver: [name]  Anticipated Caregiver's Contact Information: [name] - (c) [phone] Ability/Limitations of Caregiver: [name] works FT at [employer] from 10 am on. Caregiver Availability: Evenings only Discharge Plan Discussed with Primary Caregiver: Yes Is Caregiver In Agreement with Plan?: Yes Does Caregiver/Family have Issues with Lodging/Transportation while Pt is in Rehab?: No  Goals/Additional Needs Patient/Family Goal for Rehab:  PT/OT min assist goals Expected length of stay: 20-24 days Cultural Considerations: None Dietary Needs: Carb mod, med cal, thin liquids Equipment Needs: TBD Pt/Family Agrees to Admission and willing to participate: Yes Program Orientation Provided & Reviewed with Pt/Caregiver Including Roles  & Responsibilities: Yes  Decrease burden of Care through IP rehab admission: N/A  Possible need for SNF placement upon discharge: Yes, if he does not progress to where he can stay at home with intermittent assistance.  Patient Condition: This patient's medical and functional status has changed since the consult dated: 07/07/14 in which the Rehabilitation Physician determined and documented that the patient's condition is appropriate for intensive rehabilitative care in an inpatient rehabilitation facility. See "History of Present Illness" (above) for medical update. Functional changes are: Currently requiring Total assist + 2 for transfers with no ambulations. Patient's medical and functional status update has been discussed with the Rehabilitation physician and patient remains appropriate for inpatient rehabilitation. Will admit to inpatient rehab today.  Preadmission Screen Completed By:  Retta Diones, 07/11/2014 3:35 PM ______________________________________________________________________   Discussed status with Dr. Naaman Plummer on 07/11/14 at 1534 and received telephone approval for admission today.  Admission Coordinator:  Retta Diones, time1535/Date12/01/15

## 2014-07-11 NOTE — Interval H&P Note (Signed)
Frank [name] was admitted today to Inpatient Rehabilitation with the diagnosis of GBS.  The patient's history has been reviewed, patient examined, and there is no change in status.  Patient continues to be appropriate for intensive inpatient rehabilitation.  I have reviewed the patient's chart and labs.  Questions were answered to the patient's satisfaction.  [name] T 07/11/2014, 9:54 PM

## 2014-07-11 NOTE — Progress Notes (Signed)
Rehab admissions - I have sent updates to [employer] this am.  Awaiting response from insurance carrier regarding possible acute inpatient rehab admission.  Call me for questions.  #[phone]

## 2014-07-11 NOTE — Progress Notes (Signed)
D/C orders received for pt to transfer to inpatient rehab. Pt notified and verbalized understanding. Report called to Ed, Therapist, sports. Pt and belongings taken to [phone] by staff.

## 2014-07-11 NOTE — H&P (Signed)
Physical Medicine and Rehabilitation Admission H&P    Chief Complaint  Patient presents with  . 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 male with history of DM type 2 with peripheral neuropathy, anxiety disorder, left ankle fracture 02/2014, back and shoulder pain with mild lower extremity weakness since Oct 1st. He was evaluated by ED multiple times diagnosis of PNA as well as urinary retention, N/V with decrease in intake. MRI thoracic spine with left paracentral disc extrusion at T7-8 with mild cord flattening but no abnormal cord signal or compression deformity and underwent esophageal dilatation and was discharged to SNF on 11/18. He was readmitted on 06/30/14 with rapidly ascending paralysis and severe pain. 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 He was started on IVIG and intubated due to concerns of impending respiratory failure. He tolerated extubation on 11/25 and respiratory status stable. Swallow evaluation done and patient started on dysphagia 2, thin liquids due to generalized weakness. He has had issues with anxiety as well as paranoia requiring IV ativan. Pain has been controlled on IV fentanyl. He developed diarrhea and stool positive for cdiff therefore started on flagyl yesterday. Therapy evaluations done on 11/25 and CIR recommended for follow up therapy.    Review of Systems  Constitutional: Negative for fever, chills and weight loss.  HENT: Negative for hearing loss.   Eyes: Negative.   Respiratory: Negative for cough and hemoptysis.   Cardiovascular: Negative for chest pain and palpitations.  Gastrointestinal: Positive for diarrhea. Negative for heartburn and nausea.  Genitourinary: Negative for dysuria and urgency.  Musculoskeletal: Positive for myalgias and back pain.   Skin: Negative for itching and rash.  Neurological: Positive for dizziness, tingling, sensory change and focal weakness. Negative for headaches.  Psychiatric/Behavioral: The patient is nervous/anxious.       Past Medical History  Diagnosis Date  . COLONIC POLYPS 02/01/2008  . DIABETES MELLITUS, TYPE II 05/20/2010  . HYPERLIPIDEMIA 10/09/2008  . ANXIETY DEPRESSION 02/01/2008  . ERECTILE DYSFUNCTION 10/09/2008  . ADD 10/09/2008  . SLEEP APNEA, OBSTRUCTIVE 02/01/2008  . MORTON'S NEUROMA 05/20/2010  . PERIPHERAL NEUROPATHY 05/20/2010  . Other specified forms of hearing loss 06/27/2009  . HYPERTENSION 10/09/2008  . HEMORRHOIDS 02/01/2008  . ALLERGIC RHINITIS 10/09/2008  . Stricture and stenosis of esophagus 02/02/2008  . GERD 02/01/2008  . HIATAL HERNIA 02/01/2008  . ERECTILE DYSFUNCTION, ORGANIC 05/20/2010  . WRIST PAIN, LEFT 12/05/2009  . FOOT PAIN, LEFT 05/20/2010  . PERIPHERAL EDEMA 05/20/2010  . DYSPNEA 03/12/2010  . Abdominal pain, unspecified site 01/19/2009  . Abdominal pain, left lower quadrant 06/06/2010  . Type II or unspecified type diabetes mellitus without mention of complication, uncontrolled 11/14/2010    Past Surgical History  Procedure Laterality Date  . Carpal tunnel release    . Rotator cuff repair    . Esophageal dilation  july 2009  . Eye surgery    . Esophagogastroduodenoscopy N/A 06/27/2014    Procedure: ESOPHAGOGASTRODUODENOSCOPY (EGD);  Surgeon: Lafayette Dragon, MD;  Location: [facility];  Service: Endoscopy;  Laterality: N/A;    Family History  Problem Relation Age of Onset  . Diabetes Mother   . Heart disease Mother   . Hyperlipidemia Mother   . Depression Mother   . Diabetes Brother   . Colon cancer Neg  Hx     Social History:  reports that he has never smoked. He has never used smokeless tobacco. He reports that he drinks alcohol. He reports that he uses illicit drugs (Methaqualone).     Allergies  Allergen Reactions  . Codeine Itching    REACTION:  Itching  . Metformin And Related Other (See Comments)    GI upset, diarrhea    Medications Prior to Admission  Medication Sig Dispense Refill  . allopurinol (ZYLOPRIM) 100 MG tablet Take 2 tablets (200 mg total) by mouth daily. 30 tablet 6  . amLODipine (NORVASC) 10 MG tablet Take 1 tablet (10 mg total) by mouth daily. 90 tablet 3  . atorvastatin (LIPITOR) 10 MG tablet Take 10 mg by mouth daily.    . clonazePAM (KLONOPIN) 0.5 MG tablet Take 1 tablet (0.5 mg total) by mouth 2 (two) times daily as needed for anxiety. 30 tablet 0  . colchicine 0.6 MG tablet Take 1 tablet (0.6 mg total) by mouth 2 (two) times daily. 10 tablet 0  . DULoxetine (CYMBALTA) 30 MG capsule Take 30 mg by mouth daily.     Marland Kitchen glipiZIDE (GLUCOTROL XL) 2.5 MG 24 hr tablet Take 1 tablet (2.5 mg total) by mouth daily with breakfast. 90 tablet 3  . hydrALAZINE (APRESOLINE) 10 MG tablet Take 1 tablet (10 mg total) by mouth 3 (three) times daily. 90 tablet 1  . levofloxacin (LEVAQUIN) 500 MG tablet Take 1 tablet (500 mg total) by mouth daily. 5 tablet 0  . meloxicam (MOBIC) 15 MG tablet Take 1 tablet (15 mg total) by mouth daily. 30 tablet 0  . methylphenidate 54 MG PO CR tablet Take 54 mg by mouth 2 (two) times daily.    . metoCLOPramide (REGLAN) 5 MG tablet Take 1 tablet (5 mg total) by mouth 4 (four) times daily -  before meals and at bedtime. 90 tablet 1  . NUCYNTA 100 MG TABS Take 1 tablet by mouth 3 (three) times daily.     Marland Kitchen oxyCODONE-acetaminophen (PERCOCET) 7.5-325 MG per tablet Take 1 tablet by mouth every 6 (six) hours as needed for pain. 20 tablet 0  . pantoprazole (PROTONIX) 40 MG tablet Take 1 tablet (40 mg total) by mouth daily. 30 tablet 0  . pregabalin (LYRICA) 200 MG capsule Take 200 mg by mouth 2 (two) times daily.    Marland Kitchen glucose blood (FREESTYLE TEST STRIPS) test strip Use as instructed 100 each 12  . ondansetron (ZOFRAN) 4 MG tablet Take 1 tablet (4 mg total) by mouth 2 (two) times daily. (Patient not taking:  Reported on 06/30/2014) 60 tablet 1  . ondansetron (ZOFRAN) 4 MG tablet Take 1 tablet (4 mg total) by mouth every 8 (eight) hours as needed for nausea or vomiting. (Patient not taking: Reported on 06/30/2014) 90 tablet 0    Home: Home Living Family/patient expects to be discharged to:: Inpatient rehab Living Arrangements: Spouse/significant other   Functional History: Prior Function Level of Independence: Needs assistance ADL's / Homemaking Assistance Needed: unsure of exact baseline. Pt was at SNF PTA and he states he was working with therapy. Comments: Unclear true baseline as pt has been having progressive weakness and increasing frequency of falls per chart.    Functional Status:  Mobility: Bed Mobility Overal bed mobility: Needs Assistance Bed Mobility: Rolling, Sidelying to Sit Rolling: Supervision Sidelying to sit: Min guard Supine to sit: Mod assist, HOB elevated Sit to supine: Min assist General bed mobility comments: with rails; heavy reliance on  UEs and rails Transfers Overall transfer level: Needs assistance Equipment used: Rolling walker (2 wheeled) Transfers: Sit to/from Stand, Stand Pivot Transfers Sit to Stand: +2 safety/equipment, +2 physical assistance, Min assist Stand pivot transfers: Max assist, +2 physical assistance, +2 safety/equipment General transfer comment: x2 to stand; as pt comes to full upright position, he immediately thrusts hips/pelvis forward with anterior LOB requiring max assist to recover. Pt reports he feels more secure with hips anterior to feet and is afraid knees will buckle when he is centered over his feet Ambulation/Gait Ambulation/Gait assistance: Max assist, +2 physical assistance Ambulation Distance (Feet): 4 Feet Assistive device: Rolling walker (2 wheeled) Gait Pattern/deviations: Step-to pattern, Decreased stride length, Decreased weight shift to right, Decreased weight shift to left, Steppage, Ataxic (anterior lean) General Gait  Details: Pt with better placement of his legs when focused on walking forward in straight path (chair then pulled up behind him with no pivoting/turning)    ADL: ADL Overall ADL's : Needs assistance/impaired Eating/Feeding: Moderate assistance, Sitting Eating/Feeding Details (indicate cue type and reason): sitting EOB. Assist to help scoop pudding onto spoon and control for ataxic movements at times. Pt encouraged to stabilize R UE against body for better control but pt had trouble doing so. Use tray table and L elbow propped on table seated at EOB. May benefit from built up handles.  Grooming: Wash/dry face, Sitting, Moderate assistance Grooming Details (indicate cue type and reason): intermittently for sitting balance and to help with control of washcloth over face so he didnt rub his eye too hard and for thoroughness.  Upper Body Bathing: Maximal assistance, Sitting Lower Body Bathing: Maximal assistance, Bed level Upper Body Dressing : Moderate assistance, Bed level Lower Body Dressing: Total assistance, Bed level Lower Body Dressing Details (indicate cue type and reason): with socks. General ADL Comments: Pt able to sit at EOB for approximately 15 minutes with min guard to min assist for balance. tends to lean posteriorly and needs min assist to correct. Pt ate several bites of pudding and washed face at EOB. Worked on trunk control and posture as pt also tends to flex at neck and trunk with activity.   Cognition: Cognition Overall Cognitive Status: No family/caregiver present to determine baseline cognitive functioning Orientation Level: Oriented X4 Cognition Arousal/Alertness: Awake/alert Behavior During Therapy: Restless, Impulsive Overall Cognitive Status: No family/caregiver present to determine baseline cognitive functioning Area of Impairment: Safety/judgement Orientation Level: Disoriented to, Situation (pt appropriately found calendar in room for day and date.  ) Current  Attention Level: Focused Memory: Decreased short-term memory Following Commands: Follows one step commands with increased time Safety/Judgement: Decreased awareness of safety, Decreased awareness of deficits Awareness: Emergent Problem Solving: Slow processing, Requires verbal cues, Requires tactile cues General Comments: pt tending to perseverate on washing his face and clearing objects off his tray table. Needs cues to stay focused on task and for completion of task in order to start another task.   Physical Exam: Blood pressure 125/85, pulse 83, temperature 97.6 F (36.4 C), temperature source Axillary, resp. rate 17, height _0  (1.753 m), weight 81.693 kg (180 lb 1.6 oz), SpO2 100 %. Physical Exam  Constitutional: He is oriented to person, place, and time. He appears well-developed. No distress.  HENT:  Head: Normocephalic and atraumatic.  Eyes: Conjunctivae and EOM are normal. Pupils are equal, round, and reactive to light.  Neck: No JVD present. No tracheal deviation present. No thyromegaly present.  Cardiovascular: Normal rate and regular rhythm.   Respiratory: No  respiratory distress. He has no wheezes. He has no rales. He exhibits no tenderness.  GI: He exhibits no distension. There is no tenderness. There is no rebound and no guarding.  Musculoskeletal: He exhibits no edema or tenderness.  Lymphadenopathy:    He has no cervical adenopathy.  Neurological: He is alert and oriented to person, place, and time.  Diffuse sensory loss distal greater than proximal in stocking glove distribution, inconsistent in all 4 limbs, lower more affected than uppers. Motor: 4/5 deltoid, bicep, tricep, hands. LE: 3/5 hf, 3+ ke and 4/5 ankles--not consistent with effort, tended to be anxious. DTR's 1+ to absent throughout  Skin: Skin is warm. He is not diaphoretic.  Psychiatric:  Anxious, frequently needs to be redirected.    Results for orders placed or performed during the hospital encounter  of 06/30/14 (from the past 48 hour(s))  Glucose, capillary     Status: Abnormal   Collection Time: 07/09/14  4:55 PM  Result Value Ref Range   Glucose-Capillary 104 (H) 70 - 99 mg/dL  Glucose, capillary     Status: Abnormal   Collection Time: 07/09/14  9:18 PM  Result Value Ref Range   Glucose-Capillary 130 (H) 70 - 99 mg/dL   Comment 1 Notify RN    Comment 2 Documented in Chart   Glucose, capillary     Status: Abnormal   Collection Time: 07/10/14  6:31 AM  Result Value Ref Range   Glucose-Capillary 111 (H) 70 - 99 mg/dL  Clostridium Difficile by PCR     Status: Abnormal   Collection Time: 07/10/14 11:18 AM  Result Value Ref Range   C difficile by pcr POSITIVE (A) NEGATIVE    Comment: CRITICAL RESULT CALLED TO, READ BACK BY AND VERIFIED WITH: VASIADIAS RN 16:50 07/10/14 ([email or web address])   Glucose, capillary     Status: Abnormal   Collection Time: 07/10/14 11:42 AM  Result Value Ref Range   Glucose-Capillary 111 (H) 70 - 99 mg/dL   Comment 1 Documented in Chart   Glucose, capillary     Status: None   Collection Time: 07/10/14  4:32 PM  Result Value Ref Range   Glucose-Capillary 91 70 - 99 mg/dL   Comment 1 Documented in Chart   Glucose, capillary     Status: Abnormal   Collection Time: 07/10/14  9:53 PM  Result Value Ref Range   Glucose-Capillary 142 (H) 70 - 99 mg/dL   Comment 1 Notify RN    Comment 2 Documented in Chart   Basic metabolic panel     Status: Abnormal   Collection Time: 07/11/14  4:58 AM  Result Value Ref Range   Sodium 137 137 - 147 mEq/L   Potassium 4.0 3.7 - 5.3 mEq/L   Chloride 100 96 - 112 mEq/L   CO2 23 19 - 32 mEq/L   Glucose, Bld 110 (H) 70 - 99 mg/dL   BUN 17 6 - 23 mg/dL   Creatinine, Ser 0.89 0.50 - 1.35 mg/dL   Calcium 9.3 8.4 - 10.5 mg/dL   GFR calc non Af Amer >90 >90 mL/min   GFR calc Af Amer >90 >90 mL/min    Comment: (NOTE) The eGFR has been calculated using the CKD EPI equation. This calculation has not been validated in all clinical  situations. eGFR's persistently <90 mL/min signify possible Chronic Kidney Disease.    Anion gap 14 5 - 15  CBC     Status: Abnormal   Collection Time: 07/11/14  4:58 AM  Result Value Ref Range   WBC 6.3 4.0 - 10.5 K/uL   RBC 3.67 (L) 4.22 - 5.81 MIL/uL   Hemoglobin 10.8 (L) 13.0 - 17.0 g/dL   HCT 32.3 (L) 39.0 - 52.0 %   MCV 88.0 78.0 - 100.0 fL   MCH 29.4 26.0 - 34.0 pg   MCHC 33.4 30.0 - 36.0 g/dL   RDW 12.7 11.5 - 15.5 %   Platelets 308 150 - 400 K/uL  Glucose, capillary     Status: Abnormal   Collection Time: 07/11/14  6:36 AM  Result Value Ref Range   Glucose-Capillary 149 (H) 70 - 99 mg/dL   Comment 1 Documented in Chart    Comment 2 Notify RN   Glucose, capillary     Status: Abnormal   Collection Time: 07/11/14 11:38 AM  Result Value Ref Range   Glucose-Capillary 127 (H) 70 - 99 mg/dL   Comment 1 Notify RN    Comment 2 Documented in Chart    No results found.     Medical Problem List and Plan: 1. Functional deficits secondary to Guillain Barre Syndrome (vs CIDP) in patient with chronic peipheral neuropathy 2.  DVT Prophylaxis/Anticoagulation: Pharmaceutical: Lovenox 3. Chronic Pain Management:  Was on nucynta 100 mg tid PTA.   -can resume lyrica as it's on formulary  -lyrica and cymbalta 4. Anxiety/depression/Mood: Continue Cymbalta for mood stabilization. Will continue ativan po prn for anxiety. LCSW to follow for evaluation and support.  5. Neuropsych: This patient is  capable of making decisions on his own behalf. 6. Skin/Wound Care:  Routine pressure relief measures 7. Fluids/Electrolytes/Nutrition: Monitor I/O. Check lytes in am to monitor for recurrent hypokalemia 8. HTN: Blood pressure controlled off medications.  9. Peripheral neuropathy: Continue lyrica bid with Cymbalta.   10. DM type 2: Continue to check BS ac/hs basis with SSI for elevated BS. May need glucotrol resumed as po intake improves.   11. Anemia: Will recheck H/H in am. Monitor stool  guaiacs.  10 OSA: 11. GERD s/p recent dilatation of esophageal stricture:  12. C diff colitis: Flagyl D# 2/14     Post Admission Physician Evaluation: 1. Functional deficits secondary  to GBS/CIDP. 2. Patient is admitted to receive collaborative, interdisciplinary care between the physiatrist, rehab nursing staff, and therapy team. 3. Patient's level of medical complexity and substantial therapy needs in context of that medical necessity cannot be provided at a lesser intensity of care such as a SNF. 4. Patient has experienced substantial functional loss from his/her baseline which was documented above under the "Functional History" and "Functional Status" headings.  Judging by the patient's diagnosis, physical exam, and functional history, the patient has potential for functional progress which will result in measurable gains while on inpatient rehab.  These gains will be of substantial and practical use upon discharge  in facilitating mobility and self-care at the household level. 5. Physiatrist will provide 24 hour management of medical needs as well as oversight of the therapy plan/treatment and provide guidance as appropriate regarding the interaction of the two. 6. 24 hour rehab nursing will assist with bladder management, bowel management, safety, skin/wound care, disease management, medication administration, pain management and patient education  and help integrate therapy concepts, techniques,education, etc. 7. PT will assess and treat for/with: Lower extremity strength, range of motion, stamina, balance, functional mobility, safety, adaptive techniques and equipment, NMR, pain mgt, activity tolerance.   Goals are: supervision. 8. OT will assess and treat for/with: ADL's, functional mobility, safety, upper  extremity strength, adaptive techniques and equipment, NMR, balance, pain mgt, ego support.   Goals are: supervision. Therapy may proceed with showering this patient. 9. SLP will assess  and treat for/with: n/a.  Goals are: n/a. 10. Case Management and Social Worker will assess and treat for psychological issues and discharge planning. 11. Team conference will be held weekly to assess progress toward goals and to determine barriers to discharge. 12. Patient will receive at least 3 hours of therapy per day at least 5 days per week. 13. ELOS: 16-20 days       14. Prognosis:  good     Meredith Staggers, MD, [facility] 07/11/2014   07/11/2014

## 2014-07-11 NOTE — Progress Notes (Signed)
Physical Therapy Treatment Patient Details Name: Frank [name] MRN: [id number] DOB: 1956/01/23 Today's Date: 07/11/2014    History of Present Illness pt presents with GBS.  pt has been treated with Plasma Exchange and was intubated 11/20- 11/25.      PT Comments    Pt very motivated, however slightly impulsive and remains very weak. Pt is a high fall risk. He has a difficult time finding neutral/upright with tendency to lean anteriorly. LE placement better today with ambulation.   Follow Up Recommendations  CIR     Equipment Recommendations  None recommended by PT    Recommendations for Other Services       Precautions / Restrictions Precautions Precautions: Fall Precaution Comments: h/o multiple falls    Mobility  Bed Mobility Overal bed mobility: Needs Assistance Bed Mobility: Rolling;Sidelying to Sit Rolling: Supervision Sidelying to sit: Min guard       General bed mobility comments: with rails; heavy reliance on UEs and rails  Transfers Overall transfer level: Needs assistance Equipment used: Rolling walker (2 wheeled)   Sit to Stand: +2 safety/equipment;+2 physical assistance;Min assist         General transfer comment: x2 to stand; as pt comes to full upright position, he immediately thrusts hips/pelvis forward with anterior LOB requiring max assist to recover. Pt reports he feels more secure with hips anterior to feet and is afraid knees will buckle when he is centered over his feet  Ambulation/Gait Ambulation/Gait assistance: Max assist;+2 physical assistance Ambulation Distance (Feet): 4 Feet Assistive device: Rolling walker (2 wheeled) Gait Pattern/deviations: Step-to pattern;Decreased stride length;Decreased weight shift to right;Decreased weight shift to left;Steppage;Ataxic (anterior lean)     General Gait Details: Pt with better placement of his legs when focused on walking forward in straight path (chair then pulled up behind him with no  pivoting/turning)   Stairs            Wheelchair Mobility    Modified Rankin (Stroke Patients Only)       Balance Overall balance assessment: Needs assistance   Sitting balance-Leahy Scale: Poor Sitting balance - Comments: close supervision due to impulsivity   Standing balance support: Bilateral upper extremity supported Standing balance-Leahy Scale: Zero Standing balance comment: stood x 60 seconds each time with focus on finding upright (pt leans ant)                    Cognition Arousal/Alertness: Awake/alert Behavior During Therapy: Restless;Impulsive Overall Cognitive Status: No family/caregiver present to determine baseline cognitive functioning Area of Impairment: Safety/judgement         Safety/Judgement: Decreased awareness of safety;Decreased awareness of deficits Awareness: Emergent        Exercises Other Exercises Other Exercises: Pt reports he practiced heel to shin bil LE's (as he was instructed to do for coordination)    General Comments        Pertinent Vitals/Pain Pain Assessment: No/denies pain    Home Living                      Prior Function            PT Goals (current goals can now be found in the care plan section) Acute Rehab PT Goals Patient Stated Goal: pt wants to get up OOB Progress towards PT goals: Progressing toward goals    Frequency  Min 3X/week    PT Plan Current plan remains appropriate    Co-evaluation  End of Session Equipment Utilized During Treatment: Gait belt Activity Tolerance: Patient limited by fatigue Patient left: in chair;with call bell/phone within reach;with chair alarm set;Other (comment) (LCSW in room)     Time: 1355-1415 PT Time Calculation (min) (ACUTE ONLY): 20 min  Charges:  $Therapeutic Activity: 8-22 mins                    G Codes:      Frank [name] 07/28/14, 2:28 PM Pager [phone]

## 2014-07-11 NOTE — Progress Notes (Signed)
Rehab admissions - I have approval from [facility] for acute inpatient rehab admission today.  Bed available and will admit to inpatient rehab today.  Call me for questions.  #[phone]

## 2014-07-12 ENCOUNTER — Inpatient Hospital Stay (HOSPITAL_COMMUNITY): Payer: BC Managed Care – PPO | Admitting: Occupational Therapy

## 2014-07-12 ENCOUNTER — Inpatient Hospital Stay (HOSPITAL_COMMUNITY): Payer: BC Managed Care – PPO | Admitting: Speech Pathology

## 2014-07-12 ENCOUNTER — Inpatient Hospital Stay (HOSPITAL_COMMUNITY): Payer: BC Managed Care – PPO

## 2014-07-12 LAB — COMPREHENSIVE METABOLIC PANEL
ALBUMIN: 3.6 g/dL (ref 3.5–5.2)
ALT: 37 U/L (ref 0–53)
ANION GAP: 12 (ref 5–15)
AST: 16 U/L (ref 0–37)
Alkaline Phosphatase: 41 U/L (ref 39–117)
BILIRUBIN TOTAL: 0.2 mg/dL — AB (ref 0.3–1.2)
BUN: 16 mg/dL (ref 6–23)
CHLORIDE: 104 meq/L (ref 96–112)
CO2: 27 mEq/L (ref 19–32)
Calcium: 9.5 mg/dL (ref 8.4–10.5)
Creatinine, Ser: 0.88 mg/dL (ref 0.50–1.35)
GLUCOSE: 101 mg/dL — AB (ref 70–99)
Potassium: 4.2 mEq/L (ref 3.7–5.3)
Sodium: 143 mEq/L (ref 137–147)
Total Protein: 5.7 g/dL — ABNORMAL LOW (ref 6.0–8.3)

## 2014-07-12 LAB — CBC WITH DIFFERENTIAL/PLATELET
Basophils Absolute: 0 10*3/uL (ref 0.0–0.1)
Basophils Relative: 0 % (ref 0–1)
Eosinophils Absolute: 0.2 10*3/uL (ref 0.0–0.7)
Eosinophils Relative: 3 % (ref 0–5)
HEMATOCRIT: 32.9 % — AB (ref 39.0–52.0)
HEMOGLOBIN: 11.3 g/dL — AB (ref 13.0–17.0)
Lymphocytes Relative: 40 % (ref 12–46)
Lymphs Abs: 2.3 10*3/uL (ref 0.7–4.0)
MCH: 30.8 pg (ref 26.0–34.0)
MCHC: 34.3 g/dL (ref 30.0–36.0)
MCV: 89.6 fL (ref 78.0–100.0)
MONO ABS: 0.5 10*3/uL (ref 0.1–1.0)
MONOS PCT: 9 % (ref 3–12)
NEUTROS ABS: 2.7 10*3/uL (ref 1.7–7.7)
NEUTROS PCT: 48 % (ref 43–77)
Platelets: 316 10*3/uL (ref 150–400)
RBC: 3.67 MIL/uL — ABNORMAL LOW (ref 4.22–5.81)
RDW: 12.7 % (ref 11.5–15.5)
WBC: 5.8 10*3/uL (ref 4.0–10.5)

## 2014-07-12 LAB — GLUCOSE, CAPILLARY
GLUCOSE-CAPILLARY: 86 mg/dL (ref 70–99)
Glucose-Capillary: 154 mg/dL — ABNORMAL HIGH (ref 70–99)
Glucose-Capillary: 99 mg/dL (ref 70–99)
Glucose-Capillary: 161 mg/dL — ABNORMAL HIGH (ref 70–99)

## 2014-07-12 MED ORDER — HEPARIN SOD (PORK) LOCK FLUSH 10 UNIT/ML IV SOLN: 20.0000 [IU] | INTRAVENOUS | Status: DC | PRN

## 2014-07-12 MED ORDER — OXYCODONE HCL 5 MG PO TABS
15.0000 mg | ORAL_TABLET | Freq: Four times a day (QID) | ORAL | Status: DC | PRN
  Administered 2014-07-12 – 2014-07-25 (×34): 15 mg via ORAL
  Filled 2014-07-12 (×37): qty 3

## 2014-07-12 MED ORDER — HEPARIN SOD (PORK) LOCK FLUSH 10 UNIT/ML IV SOLN
20.0000 [IU] | Freq: Two times a day (BID) | INTRAVENOUS | Status: DC
Start: 2014-07-12 — End: 2014-07-12

## 2014-07-12 MED ORDER — HEPARIN SODIUM (PORCINE) 1000 UNIT/ML IJ SOLN
3000.0000 [IU] | Freq: Once | INTRAMUSCULAR | Status: DC
  Administered 2014-07-12: 2400 [IU] via INTRAVENOUS

## 2014-07-12 NOTE — Evaluation (Signed)
Occupational Therapy Assessment and Plan  Patient Details  Name: Frank [name] MRN: [id number] Date of Birth: [birth year]  OT Diagnosis: ataxia and muscle weakness (generalized) Rehab Potential: Rehab Potential (ACUTE ONLY): Good ELOS: 2 1/2 weeks- 3 weeks   Today's Date: 07/12/2014 OT Individual Time: 3734-2876 OT Calculated Individual Time (min): 65 min    Problem List:  Patient Active Problem List   Diagnosis Date Noted  . C. difficile colitis 07/11/2014  . Guillain Barr syndrome 07/11/2014  . Acute respiratory failure   . Encounter for orogastric (OG) tube placement   . Acute respiratory acidosis   . History of ETT   . Pain   . Rapidly progressive weakness 06/30/2014  . Acute respiratory failure with hypoxia 06/30/2014  . Protein-calorie malnutrition, severe 06/30/2014  . Ascending paralysis   . Nausea with vomiting   . Back pain, thoracic 06/21/2014  . Nausea and vomiting 06/21/2014  . General weakness 06/21/2014  . Gait disorder 06/21/2014  . Decreased frequency of bowel movements 06/21/2014  . Orthostasis 06/21/2014  . Chronic back pain greater than 3 months duration 06/21/2014  . Dehydration 06/21/2014  . Generalized weakness 06/21/2014  . Skin lesion 05/17/2014  . Low back pain 05/17/2014  . Screen for STD (sexually transmitted disease) 05/17/2014  . Closed low lateral malleolus fracture 03/10/2014  . Acute gouty arthritis 01/12/2014  . Change in bowel habits 08/15/2013  . Hemorrhage of rectum and anus 08/15/2013  . Umbilical hernia 81/15/7262  . Rash 11/25/2010  . Foot pain 11/25/2010  . Preventative health care 11/22/2010  . Uncontrolled type II diabetes mellitus with polyneuropathy 05/20/2010  . MORTON'S NEUROMA 05/20/2010  . PERIPHERAL NEUROPATHY 05/20/2010  . ERECTILE DYSFUNCTION, ORGANIC 05/20/2010  . PERIPHERAL EDEMA 05/20/2010  . HYPERLIPIDEMIA 10/09/2008  . ADD 10/09/2008  . Essential hypertension 10/09/2008  . ALLERGIC RHINITIS 10/09/2008  .  Stricture and stenosis of esophagus 02/02/2008  . COLONIC POLYPS 02/01/2008  . ANXIETY DEPRESSION 02/01/2008  . SLEEP APNEA, OBSTRUCTIVE 02/01/2008  . HEMORRHOIDS 02/01/2008  . GERD 02/01/2008  . HIATAL HERNIA 02/01/2008    Past Medical History:  Past Medical History  Diagnosis Date  . COLONIC POLYPS 02/01/2008  . DIABETES MELLITUS, TYPE II 05/20/2010  . HYPERLIPIDEMIA 10/09/2008  . ANXIETY DEPRESSION 02/01/2008  . ERECTILE DYSFUNCTION 10/09/2008  . ADD 10/09/2008  . SLEEP APNEA, OBSTRUCTIVE 02/01/2008  . MORTON'S NEUROMA 05/20/2010  . PERIPHERAL NEUROPATHY 05/20/2010  . Other specified forms of hearing loss 06/27/2009  . HYPERTENSION 10/09/2008  . HEMORRHOIDS 02/01/2008  . ALLERGIC RHINITIS 10/09/2008  . Stricture and stenosis of esophagus 02/02/2008  . GERD 02/01/2008  . HIATAL HERNIA 02/01/2008  . ERECTILE DYSFUNCTION, ORGANIC 05/20/2010  . WRIST PAIN, LEFT 12/05/2009  . FOOT PAIN, LEFT 05/20/2010  . PERIPHERAL EDEMA 05/20/2010  . DYSPNEA 03/12/2010  . Abdominal pain, unspecified site 01/19/2009  . Abdominal pain, left lower quadrant 06/06/2010  . Type II or unspecified type diabetes mellitus without mention of complication, uncontrolled 11/14/2010   Past Surgical History:  Past Surgical History  Procedure Laterality Date  . Carpal tunnel release    . Rotator cuff repair    . Esophageal dilation  july 2009  . Esophagogastroduodenoscopy N/A 06/27/2014    Procedure: ESOPHAGOGASTRODUODENOSCOPY (EGD);  Surgeon: Lafayette Dragon, MD;  Location: [facility];  Service: Endoscopy;  Laterality: N/A;  . Eye surgery      catract surgery on both eyes    Assessment & Plan Clinical Impression: Frank [name] is a 58 y.o. male with  history of DM type 2 with peripheral neuropathy, anxiety disorder, left ankle fracture 02/2014, back and shoulder pain with mild lower extremity weakness since Oct 1st. He was evaluated by ED multiple times diagnosis of PNA as well as urinary retention, N/V with decrease in  intake. MRI thoracic spine with left paracentral disc extrusion at T7-8 with mild cord flattening but no abnormal cord signal or compression deformity and underwent esophageal dilatation and was discharged to SNF on 11/18. He was readmitted on 06/30/14 with rapidly ascending paralysis and severe pain. 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 He was started on IVIG and intubated due to concerns of impending respiratory failure. He tolerated extubation on 11/25 and respiratory status stable. Swallow evaluation done and patient started on dysphagia 2, thin liquids due to generalized weakness. He has had issues with anxiety as well as paranoia requiring IV ativan. Pain has been controlled on IV fentanyl. He developed diarrhea and stool positive for cdiff therefore started on flagyl yesterday. Therapy evaluations done on 11/25 and CIR recommended for follow up therapy.  Patient transferred to CIR on 07/11/2014 .    Patient currently requires up to max with basic self-care skills secondary to muscle weakness, ataxia and decreased coordination and decreased sitting balance, decreased standing balance, decreased postural control and decreased balance strategies.  Prior to hospitalization, patient could complete ADLs and IADLs with modified independent .  Patient will benefit from skilled intervention to increase independence with basic self-care skills prior to discharge home with care partner.  Anticipate patient will require intermittent supervision and follow up home health.  OT - End of Session Activity Tolerance: Tolerates 30+ min activity with multiple rests Endurance Deficit: Yes OT Assessment Rehab Potential (ACUTE ONLY): Good OT Patient demonstrates impairments in the following area(s):  Balance;Cognition;Endurance;Motor;Safety;Pain;Sensory;Skin Integrity OT Basic ADL's Functional Problem(s): Bathing;Toileting;Dressing;Grooming;Eating OT Advanced ADL's Functional Problem(s): Simple Meal Preparation OT Transfers Functional Problem(s): Toilet;Tub/Shower OT Plan OT Intensity: Minimum of 1-2 x/day, 45 to 90 minutes OT Frequency: 5 out of 7 days OT Duration/Estimated Length of Stay: 2 1/2 weeks- 3 weeks OT Treatment/Interventions: Balance/vestibular training;Discharge planning;Community reintegration;DME/adaptive equipment instruction;Functional mobility training;Psychosocial support;Pain management;Self Care/advanced ADL retraining;Therapeutic Activities;Therapeutic Exercise;UE/LE Coordination activities;UE/LE Strength taining/ROM;Wheelchair propulsion/positioning OT Self Feeding Anticipated Outcome(s): S- Mod I OT Basic Self-Care Anticipated Outcome(s): Min A- S OT Toileting Anticipated Outcome(s): Min A- S OT Bathroom Transfers Anticipated Outcome(s): Min A- S OT Recommendation Recommendations for Other Services: Neuropsych consult Patient destination: Home Follow Up Recommendations: Home health OT Equipment Recommended: To be determined   Skilled Therapeutic Intervention Initial OT evaluation completed. Skilled OT session focusing on ADL retraining, functional transfers, and activity tolerance/endurance. Pt seated in w/c when arriving reporting 8/10 pain and RN aware. OT profile questions asked and MMT and sensory testing performed. Pt completed transfer via squat pivot to toilet with max A. Pt urinated, performed hygiene, and transferred back to w/c via squat pivot with max A x 2. Pt stood with STEDY to pull up brief, with mod A x 2. Pt washed hands and completed UB bathing and dressing while seated in w/c at sink. Pt washed face for more than reasonable amount of time. Pt donned pants and stood with STEDY to pull them up. Pt sat in STEDY to brush teeth. Pt transferred to EOB  with STEDY and transferred to supine. Pt supine in bed  with HOB elevated and all needs nearby when leaving.  OT Evaluation Precautions/Restrictions  Precautions Precautions: Fall Precaution Comments: h/o multiple falls Restrictions Weight Bearing Restrictions: No   General Chart Reviewed: Yes Family/Caregiver Present: No   Pain Pain Assessment Pain Assessment: 0-10 Pain Score: 8  Pain Type: Neuropathic pain Pain Location:  ("all over" ) Pain Orientation: Other (Comment) (all over ) Pain Onset: On-going Patients Stated Pain Goal: 2 Pain Intervention(s): Medication (See eMAR)   Home Living/Prior Functioning Home Living Available Help at Discharge: Available PRN/intermittently (Works 5 min away- [place]) Type of Home: House Home Access: Stairs to enter [employer] of Steps: 3 Entrance Stairs-Rails: Right, Left, Can reach both Home Layout: One level Additional Comments: Pt's partner works during day,   Lives With: Significant other Legrand Como) IADL History Homemaking Responsibilities: Yes Meal Prep Responsibility: Secondary Laundry Responsibility: Secondary Cleaning Responsibility: No Bill Paying/Finance Responsibility: Primary Shopping Responsibility: No Child Care Responsibility: No Current License: Yes Mode of Transportation: Car Education: [employer] Occupation: Unemployed Type of Occupation: Pharmacist, 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, now unemployed) Leisure and Hobbies: Cooking, pet care, shopping, antique shopping, watch tv Prior Function Level of Independence: Requires assistive device for independence (used cane and then RW (has w/c at home))  Able to Take Stairs?: No (not without assistance) Driving: Yes Vocation: Unemployed Vocation Requirements: 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 Leisure: Hobbies-yes (Comment)   ADL See FIM  Vision/Perception  Vision- History Baseline Vision/History: Wears glasses Wears Glasses: Reading  only Patient Visual Report: Blurring of vision   Cognition Overall Cognitive Status: No family/caregiver present to determine baseline cognitive functioning Arousal/Alertness: Awake/alert Orientation Level: Oriented to person;Oriented to place;Oriented to situation;Disoriented to time (Only able to state date and month by looking at calender) Behaviors: Restless Safety/Judgment: Impaired   Sensation Sensation Light Touch: Impaired Detail Light Touch Impaired Details: Impaired RUE;Impaired LUE Additional Comments: Impaired in stocking glove distribution bilaterally Coordination Gross Motor Movements are Fluid and Coordinated: No Fine Motor Movements are Fluid and Coordinated: No Finger Nose Finger Test: Dymsetria and dyskinetic movements L>R Heel Shin Test: Dymsetria and dyskinetic movements L>R   Motor  Motor Motor: Ataxia   Mobility  Bed Mobility Bed Mobility: Sit to Supine;Scooting to [facility] Rolling Right: 4: Min assist Rolling Right Details (indicate cue type and reason): Assist to complete roll Rolling Left: 4: Min assist Rolling Left Details (indicate cue type and reason): Assist to complete roll Supine to Sit: 3: Mod assist Supine to Sit Details: Manual facilitation for weight shifting Supine to Sit Details (indicate cue type and reason): Assist to bring trunk up to sit Sit to Supine: 4: Min guard Scooting to [facility]: 5: Supervision Transfers Sit to Stand: 2: Max assist Sit to Stand Details: Manual facilitation for weight shifting;Manual facilitation for weight bearing Sit to Stand Details (indicate cue type and reason): MaxA to remain standing Stand to Sit: 3: Mod assist   Trunk/Postural Assessment  See PT note  Balance See PT note  Extremity/Trunk Assessment RUE Assessment RUE Assessment: Exceptions to [facility] RUE Strength RUE Overall Strength: Deficits (Decreased strength in shoulder, grossly 4/5) LUE Assessment LUE Assessment: Exceptions to [facility] LUE Strength LUE  Overall Strength: Deficits (Decreased strength in shoulder, grossly 4/5 )  FIM:  FIM - Grooming Grooming Steps: Wash, rinse, dry face;Wash, rinse, dry hands;Oral care, brush teeth, clean dentures Grooming: 4: Patient completes 3 of 4 or 4 of 5 steps FIM - Bathing Bathing Steps Patient Completed: Chest;Right Arm;Left Arm;Abdomen;Front perineal area (UB  bathing only this session) Bathing: 3: Mod-Patient completes 5-7 [id number] parts or 50-74% FIM - Upper Body Dressing/Undressing Upper body dressing/undressing steps patient completed: Thread/unthread right sleeve of pullover shirt/dresss;Thread/unthread left sleeve of pullover shirt/dress;Put head through opening of pull over shirt/dress;Pull shirt over trunk Upper body dressing/undressing: 4: Steadying assist FIM - Lower Body Dressing/Undressing Lower body dressing/undressing steps patient completed: Thread/unthread right pants leg;Thread/unthread left pants leg Lower body dressing/undressing: 2: Max-Patient completed 25-49% of tasks FIM - Toileting Toileting steps completed by patient: Performs perineal hygiene Toileting Assistive Devices: Grab bar or rail for support Toileting: 2: Max-Patient completed 1 of 3 steps FIM - BIT sales professionalTransfer: 1: Mechanical lift (STEDY) FIM - TRadio producerDevices: Bedside commode;Grab bars (drop arm) Toilet Transfers: 2-To toilet/BSC: Max A (lift and lower assist);1-Two helpers FIM - TCamera operatorTransfers: 0-Activity did not occur or was simulated   Refer to Care Plan for Long Term Goals  Recommendations for other services: Neuropsych  Discharge Criteria: Patient will be discharged from OT if patient refuses treatment 3 consecutive times without medical reason, if treatment goals not met, if there is a change in medical status, if patient makes no progress towards goals or if patient is discharged from hospital.  The above assessment,  treatment plan, treatment alternatives and goals were discussed and mutually agreed upon: by patient  Anjelica Gorniak Raynell 07/12/2014, 12:06 PM

## 2014-07-12 NOTE — Progress Notes (Addendum)
Nursing Note: Pt called for pain med.Pt informed that it was too soon for pain med. Pt received robaxin @ 2110 and ativan @ 2156.Pt states he needs something for pain.A; Paged on-call for prn pain med. No new orders obatined. A; Pt given trazadone 50 mg and benadryl prn for itching.Pt repositioned for comfort.wbb

## 2014-07-12 NOTE — Progress Notes (Signed)
Patient information reviewed and entered into eRehab system by Prachi Oftedahl, RN, CRRN, PPS Coordinator.  Information including medical coding and functional independence measure will be reviewed and updated through discharge.    

## 2014-07-12 NOTE — Progress Notes (Signed)
PMR Admission Coordinator Pre-Admission Assessment  Patient: Frank [name] is an 58 y.o., male MRN: [id number] DOB: [birth year] Height: '5\' 9"'  (175.3 cm) Weight: 81.693 kg (180 lb 1.6 oz)  Insurance Information HMO: PPO: Yes PCP: IPA: 80/20: OTHER:  PRIMARY: BCBS state health plan Policy#: [id number] Subscriber: [name] CM Name: Levy Pupa Phone#: [phone] Fax#: [phone] Pre-Cert#: [id number] Employer: [employer] Benefits: Phone #: [phone] Name: Gwenith Daily. Date: 08/11/13 Deduct: $700 (met all) Out of Pocket Max: $3210 (met all) Life Max: unlimited CIR: 100% SNF: 100% with 100 days max Outpatient: 100% Co-Pay: none Home Health: 100% Co-Pay: none DME: 100% Co-Pay: none Providers: in network  Emergency Contact Information Contact Information    Name Relation Home Work Mobile   [place] Significant other [phone]     Current Medical History  Patient Admitting Diagnosis: GBS  History of Present Illness: A 58 y.o. male with history of DM type 2 with peripheral neuropathy, anxiety disorder, left ankle fracture 02/2014, back and shoulder pain with mild lower extremity weakness since Oct 1st. He was evaluated by ED multiple times diagnosis of PNA as well as urinary retention, N/V with decrease in intake. MRI thoracic spine with left paracentral disc extrusion at T7-8 with mild cord flattening but no abnormal cord signal or compression deformity and underwent esophageal dilatation and was discharged to SNF on 11/18. He was readmitted on 06/30/14 with rapidly ascending paralysis and severe pain. 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 He was started on IVIG and intubated due to concerns of impending respiratory failure. He tolerated extubation on 11/25 and respiratory status stable. Swallow evaluation done and patient started on dysphagia 2, thin liquids due to generalized weakness. He has had issues with anxiety as well as paranoia. Therapy evaluations done on 11/25 and CIR recommended for follow up therapy.     Past Medical History  Past Medical History  Diagnosis Date  . COLONIC POLYPS 02/01/2008  . DIABETES MELLITUS, TYPE II 05/20/2010  . HYPERLIPIDEMIA 10/09/2008  . ANXIETY DEPRESSION 02/01/2008  . ERECTILE DYSFUNCTION 10/09/2008  . ADD 10/09/2008  . SLEEP APNEA, OBSTRUCTIVE 02/01/2008  . MORTON'S NEUROMA 05/20/2010  . PERIPHERAL NEUROPATHY 05/20/2010  . Other specified forms of hearing loss 06/27/2009  . HYPERTENSION 10/09/2008  . HEMORRHOIDS 02/01/2008  . ALLERGIC RHINITIS 10/09/2008  . Stricture and stenosis of esophagus 02/02/2008  . GERD 02/01/2008  . HIATAL HERNIA 02/01/2008  . ERECTILE DYSFUNCTION, ORGANIC 05/20/2010  . WRIST PAIN, LEFT 12/05/2009  . FOOT PAIN, LEFT 05/20/2010  . PERIPHERAL EDEMA 05/20/2010  . DYSPNEA 03/12/2010  . Abdominal pain, unspecified site 01/19/2009  . Abdominal pain, left lower quadrant 06/06/2010  . Type II or unspecified type diabetes mellitus without mention of complication, uncontrolled 11/14/2010    Family History  family history includes Depression in his mother; Diabetes in his brother and mother; Heart disease in his mother; Hyperlipidemia in his mother. There is no history of Colon cancer.  Prior Rehab/Hospitalizations: Was at [facility] for 2 nights, then back to acute hospital.  Current Medications  Current facility-administered medications:  antiseptic oral rinse (CPC / CETYLPYRIDINIUM CHLORIDE 0.05%) solution 7 mL, 7 mL, Mouth Rinse, QID, Rush Farmer, MD, 7 mL at 07/11/14 1333; bisacodyl (DULCOLAX) suppository 10 mg, 10 mg, Rectal, Daily PRN, Saralyn Pilar  Georgiana Shore, MD, 10 mg at 07/06/14 1351; chlorhexidine (PERIDEX) 0.12 % solution 15 mL, 15 mL, Mouth/Throat, BID, Rush Farmer, MD, 15 mL at 07/10/14 2018 docusate (COLACE) 50 MG/5ML liquid 100 mg, 100 mg, Oral, BID, Raylene Miyamoto, MD, 100 mg at 07/08/14 0932; DULoxetine (CYMBALTA) DR capsule 30 mg, 30 mg, Oral, Daily, Dianne Dun, NP, 30 mg at 07/11/14 1058; enoxaparin (LOVENOX) injection 40 mg, 40 mg, Subcutaneous, Q24H, Hulen Luster, DO, 40 mg at 07/11/14 1058; famotidine (PEPCID) tablet 20 mg, 20 mg, Oral, Daily, Charmian Muff Sedgwick, RPH, 20 mg at 07/11/14 1058 fentaNYL (SUBLIMAZE) injection 12.5-50 mcg, 12.5-50 mcg, Intravenous, Q2H PRN, Juanito Doom, MD, 50 mcg at 07/11/14 1328; heparin injection 1,000 Units, 1,000 Units, Intravenous, PRN, Grace Bushy Minor, NP, 2,400 Units at 06/30/14 1201; insulin aspart (novoLOG) injection 0-9 Units, 0-9 Units, Subcutaneous, TID WC, Verlee Monte, MD, 1 Units at 07/11/14 1236 labetalol (NORMODYNE,TRANDATE) injection 10 mg, 10 mg, Intravenous, Q5 min PRN, Roland Rack, MD, 10 mg at 07/07/14 0134; LORazepam (ATIVAN) injection 1 mg, 1 mg, Intravenous, Q4H PRN, Kara Mead V, MD, 1 mg at 07/10/14 2123; metroNIDAZOLE (FLAGYL) tablet 500 mg, 500 mg, Oral, 3 times per day, Verlee Monte, MD, 500 mg at 07/11/14 1332 ondansetron [facility]) injection 4 mg, 4 mg, Intravenous, Q6H PRN, Hulen Luster, DO, 4 mg at 06/30/14 0932; polyethylene glycol (MIRALAX / GLYCOLAX) packet 17 g, 17 g, Oral, Daily, Raylene Miyamoto, MD, Stopped at 07/06/14 0932; pregabalin (LYRICA) capsule 200 mg, 200 mg, Oral, BID, Dianne Dun, NP, 200 mg at 07/11/14 1058  Patients Current Diet: Diet Carb Modified Diet - low sodium heart  healthy  Precautions / Restrictions Precautions Precautions: Fall Precaution Comments: h/o multiple falls Restrictions Weight Bearing Restrictions: No   Prior Activity Level Limited Community (1-2x/wk): Has been limited since ankle fx 07/15. Was able to get around the house using a walker PTA.   Home Assistive Devices / Equipment Home Assistive Devices/Equipment: CPAP, Eyeglasses  Prior Functional Level Prior Function Level of Independence: Needs assistance ADL's / Homemaking Assistance Needed: unsure of exact baseline. Pt was at SNF PTA and he states he was working with therapy. Comments: Unclear true baseline as pt has been having progressive weakness and increasing frequency of falls per chart.   Current Functional Level Cognition  Overall Cognitive Status: No family/caregiver present to determine baseline cognitive functioning Current Attention Level: Focused Orientation Level: Oriented X4 Following Commands: Follows one step commands with increased time Safety/Judgement: Decreased awareness of safety, Decreased awareness of deficits General Comments: pt tending to perseverate on washing his face and clearing objects off his tray table. Needs cues to stay focused on task and for completion of task in order to start another task.    Extremity Assessment (includes Sensation/Coordination)  Upper Extremity Assessment: Defer to OT evaluation Lower Extremity Assessment: RLE deficits/detail;LLE deficits/detail RLE Deficits / Details: Strength grossly 3/5 with dyskinetic type movements.  LLE Deficits / Details: Strength grossly 3/5 with dyskinetic type movements.  Cervical / Trunk Assessment: Other exceptions   ADLs  Overall ADL's : Needs assistance/impaired Eating/Feeding: Moderate assistance, Sitting Eating/Feeding Details (indicate cue type and reason): sitting EOB. Assist to help scoop pudding onto spoon and control for ataxic movements at times. Pt encouraged to  stabilize R UE against body for better control but pt had trouble doing so. Use tray table and L elbow propped on table seated at EOB. May benefit from built up handles.  Grooming: Wash/dry face, Sitting,  Moderate assistance Grooming Details (indicate cue type and reason): intermittently for sitting balance and to help with control of washcloth over face so he didnt rub his eye too hard and for thoroughness.  Upper Body Bathing: Maximal assistance, Sitting Lower Body Bathing: Maximal assistance, Bed level Upper Body Dressing : Moderate assistance, Bed level Lower Body Dressing: Total assistance, Bed level Lower Body Dressing Details (indicate cue type and reason): with socks. General ADL Comments: Pt able to sit at EOB for approximately 15 minutes with min guard to min assist for balance. tends to lean posteriorly and needs min assist to correct. Pt ate several bites of pudding and washed face at EOB. Worked on trunk control and posture as pt also tends to flex at neck and trunk with activity.     Mobility  Overal bed mobility: Needs Assistance Bed Mobility: Rolling, Sidelying to Sit Rolling: Supervision Sidelying to sit: Min guard Supine to sit: Mod assist, HOB elevated Sit to supine: Min assist General bed mobility comments: with rails; heavy reliance on UEs and rails    Transfers  Overall transfer level: Needs assistance Equipment used: Rolling walker (2 wheeled) Transfers: Sit to/from Stand, Stand Pivot Transfers Sit to Stand: +2 safety/equipment, +2 physical assistance, Min assist Stand pivot transfers: Max assist, +2 physical assistance, +2 safety/equipment General transfer comment: x2 to stand; as pt comes to full upright position, he immediately thrusts hips/pelvis forward with anterior LOB requiring max assist to recover. Pt reports he feels more secure with hips anterior to feet and is afraid knees will buckle when he is centered over his feet    Ambulation / Gait /  Stairs / Wheelchair Mobility  Ambulation/Gait Ambulation/Gait assistance: Max assist, +2 physical assistance Ambulation Distance (Feet): 4 Feet Assistive device: Rolling walker (2 wheeled) Gait Pattern/deviations: Step-to pattern, Decreased stride length, Decreased weight shift to right, Decreased weight shift to left, Steppage, Ataxic (anterior lean) General Gait Details: Pt with better placement of his legs when focused on walking forward in straight path (chair then pulled up behind him with no pivoting/turning)    Posture / Balance Dynamic Sitting Balance Sitting balance - Comments: close supervision due to impulsivity    Special needs/care consideration BiPAP/CPAP Has sleep apnea, but not on CPAP CPM No Continuous Drip IV No Dialysis No  Life Vest No Oxygen Currently on O2 [place] Special Bed No Trach Size No Wound Vac (area) No  Skin States bottom, rectal area is sore.  Bowel mgmt: Incontinent of stool, now with C-Diff colitis Bladder mgmt: Voiding in urinal Diabetic mgmt Yes, on oral medication at home    Previous Home Environment Living Arrangements: Spouse/significant other [place]: No  Discharge Living Setting Plans for Discharge Living Setting: House, Lives with (comment) Type of Home at Discharge: House Discharge Home Layout: One level Discharge Home Access: Stairs to enter Entrance Stairs-Rails: Right, Left, Can reach both Entrance Stairs-Number of Steps: 3 step entry Does the patient have any problems obtaining your medications?: Yes (Describe)  Social/Family/Support Systems Patient Roles: Partner (Has a partner/significant other.) Contact Information: Danne Baxter - significant other Anticipated Caregiver: [name]  Anticipated Caregiver's Contact Information: [name] - (c) [phone] Ability/Limitations of Caregiver: [name] works FT at [employer] from 10 am on. Caregiver Availability: Evenings  only Discharge Plan Discussed with Primary Caregiver: Yes Is Caregiver In Agreement with Plan?: Yes Does Caregiver/Family have Issues with Lodging/Transportation while Pt is in Rehab?: No  Goals/Additional Needs Patient/Family Goal for Rehab: PT/OT min assist goals Expected length of  stay: 20-24 days Cultural Considerations: None Dietary Needs: Carb mod, med cal, thin liquids Equipment Needs: TBD Pt/Family Agrees to Admission and willing to participate: Yes Program Orientation Provided & Reviewed with Pt/Caregiver Including Roles & Responsibilities: Yes  Decrease burden of Care through IP rehab admission: N/A  Possible need for SNF placement upon discharge: Yes, if he does not progress to where he can stay at home with intermittent assistance.  Patient Condition: This patient's medical and functional status has changed since the consult dated: 07/07/14 in which the Rehabilitation Physician determined and documented that the patient's condition is appropriate for intensive rehabilitative care in an inpatient rehabilitation facility. See "History of Present Illness" (above) for medical update. Functional changes are: Currently requiring Total assist + 2 for transfers with no ambulations. Patient's medical and functional status update has been discussed with the Rehabilitation physician and patient remains appropriate for inpatient rehabilitation. Will admit to inpatient rehab today.  Preadmission Screen Completed By: Retta Diones, 07/11/2014 3:35 PM ______________________________________________________________________  Discussed status with Dr. Naaman Plummer on 07/11/14 at 1534 and received telephone approval for admission today.  Admission Coordinator: Retta Diones, time1535/Date12/01/15          Cosigned by: Meredith Staggers, MD at 07/11/2014 3:37 PM

## 2014-07-12 NOTE — Progress Notes (Signed)
Physical Therapy Session Note  Patient Details  Name: Frank [name] MRN: [id number] Date of Birth: 1955-09-30  Today's Date: 07/12/2014 PT Individual Time: 1445-1515 PT Individual Time Calculation (min): 30 min   Short Term Goals: Week 1:  PT Short Term Goal 1 (Week 1): Pt will move supine<>sit w/ SBA consistently PT Short Term Goal 2 (Week 1): Pt will propel w/c 100' w/ SBA consistently PT Short Term Goal 3 (Week 1): Pt will demonstrate appropriate pressure relief techniques with min cueing PT Short Term Goal 4 (Week 1): Pt will direct setup for bed<>w/c transfer w/ min cueing   Skilled Therapeutic Interventions/Progress Updates:  Pt received sitting on commode, handoff from NT. Pt performed hygiene with supervision and sit <> stand with Stedy for total A for clothing management. Pt washed hands from [facility] and transferred stand > sit to w/c with mod A. Gait training initiated with 2 person hand held assist and close wheelchair follow for safety. Patient very fearful of falling with standing and ambulation. Pt with ataxia and dyskinesia and requires max verbal/tactile cues to push through UEs for increased support. Pt able to ambulate x 5 ft and x 10 ft with +3 assist with increased B knee flexion and forward lean and alternating hip/trunk extension with dependence on Y ligaments as well as narrow BOS, ataxic gait pattern, and scissoring with one instance of pt stepping left foot on top of right foot. Pt propelled w/c back to room with BUE and min-mod A. Pt left sitting in w/c with quick release belt on and all needs within reach.   Therapy Documentation Precautions:  Precautions Precautions: Fall Precaution Comments: h/o multiple falls Restrictions Weight Bearing Restrictions: No Pain:  No c/o pain during session  See FIM for current functional status  Therapy/Group: Individual Therapy  Laretta Alstrom 07/12/2014, 3:24 PM

## 2014-07-12 NOTE — Progress Notes (Signed)
[facility]     PROGRESS NOTE    Subjective/Complaints: Uneventful night. Slept well. Slow to arouse this morning. A  review of systems has been performed and if not noted above is otherwise negative.   Objective: Vital Signs: Blood pressure 108/61, pulse 79, temperature 98.4 F (36.9 C), temperature source Oral, resp. rate 19, weight 83 kg (182 lb 15.7 oz), SpO2 99 %. No results found.  Recent Labs  07/11/14 0458 07/12/14 0455  WBC 6.3 5.8  HGB 10.8* 11.3*  HCT 32.3* 32.9*  PLT 308 316    Recent Labs  07/11/14 0458 07/12/14 0455  NA 137 143  K 4.0 4.2  CL 100 104  GLUCOSE 110* 101*  BUN 17 16  CREATININE 0.89 0.88  CALCIUM 9.3 9.5   CBG (last 3)   Recent Labs  07/11/14 1644 07/11/14 2106 07/12/14 0652  GLUCAP 95 111* 154*    Wt Readings from Last 3 Encounters:  07/12/14 83 kg (182 lb 15.7 oz)  07/10/14 81.693 kg (180 lb 1.6 oz)  07/02/14 85.276 kg (188 lb)    Physical Exam:  Constitutional: He is oriented to person, place, and time. He appears well-developed. No distress.  HENT:  Head: Normocephalic and atraumatic.  Eyes: Conjunctivae and EOM are normal. Pupils are equal, round, and reactive to light.  Neck: No JVD present. No tracheal deviation present. No thyromegaly present.  Cardiovascular: Normal rate and regular rhythm.  Respiratory: No respiratory distress. He has no wheezes. He has no rales. He exhibits no tenderness.  GI: He exhibits no distension. There is no tenderness. There is no rebound and no guarding.  Musculoskeletal: He exhibits no edema or tenderness.  Lymphadenopathy:   He has no cervical adenopathy.  Neurological: He is alert and oriented to person, place, and time.  Diffuse sensory loss distal greater than proximal in stocking glove distribution, inconsistent in all 4 limbs, lower more affected than uppers. Motor: 4/5 deltoid, bicep, tricep, hands. LE: 3/5 hf, 3+ ke and 4/5 ankles--not  consistent with effort, tended to be anxious. DTR's 1+ to absent throughout  Skin: Skin is warm. He is not diaphoretic.  Psychiatric:  Flat, slow to arouse  Assessment/Plan: 1. 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 Physiatrist is providing close team supervision and 24 hour management of active medical problems listed below. Physiatrist and rehab team continue to assess barriers to discharge/monitor patient progress toward functional and medical goals. FIM:                   Comprehension Comprehension Mode: Auditory Comprehension: 5-Follows basic conversation/direction: With extra time/assistive device  Expression Expression Mode: Verbal Expression: 5-Expresses basic needs/ideas: With no assist  Social Interaction Social Interaction: 6-Interacts appropriately with others with medication or extra time (anti-anxiety, antidepressant).  Problem Solving Problem Solving: 5-Solves basic problems: With no assist  Memory Memory: 6-More than reasonable amt of time  Medical Problem List and Plan: 1. Functional deficits secondary to Guillain Barre Syndrome (vs CIDP) in patient with chronic peipheral neuropathy 2. DVT Prophylaxis/Anticoagulation: Pharmaceutical: Lovenox 3. Chronic Pain Management: Was on nucynta 100 mg tid PTA.  -substitute oxycodone for nucynta (which isn't on formulary) -lyrica and cymbalta 4. Anxiety/depression/Mood: Continue Cymbalta for mood stabilization. Will continue ativan po prn for anxiety. LCSW to follow for evaluation and support.  5. Neuropsych: This patient is capable of making decisions on his own behalf.  6. Skin/Wound Care: Routine pressure relief measures 7. Fluids/Electrolytes/Nutrition: Monitor I/O. BMET within normal limits today 8. HTN: Blood pressure controlled off medications.  9.  Peripheral neuropathy: Continue lyrica bid with Cymbalta.  10. DM type 2: Continue to check BS ac/hs basis with SSI for elevated BS. May need glucotrol resumed as po intake improves.   -for now sugars are controlled 11. Anemia: hgb up to 11.3  10 OSA: no new issues 11. GERD s/p recent dilatation of esophageal stricture:  12. C diff colitis: Flagyl through 12/14 --no loose stools yesterday  LOS (Days) 1 A FACE TO FACE EVALUATION WAS PERFORMED  Elois Averitt T 07/12/2014 8:24 AM

## 2014-07-12 NOTE — Progress Notes (Signed)
Nursing Note: Pt oriented to Rehab.and what to expect the patient given his schedule and reviewed it and explained it to pt.Pt educated on his safety plan and pt verbalizes understanding.Pt educated on use of call bell and call bell in bathroom and pt states he understands that he is not to get up w/o staff assist.wbb

## 2014-07-12 NOTE — Plan of Care (Signed)
Problem: RH KNOWLEDGE DEFICIT Goal: RH STG INCREASE KNOWLEDGE OF DYSPHAGIA/FLUID INTAKE Outcome: Not Applicable Date Met:  71/90/70

## 2014-07-12 NOTE — Progress Notes (Signed)
Nursing Note: Pt has slept till now.Pt voided and given robaxin and back to sleep.wbb

## 2014-07-12 NOTE — Evaluation (Signed)
Speech Language Pathology Assessment and Plan  Patient Details  Name: Frank [name] MRN: [id number] Date of Birth: October 07, 1955  SLP Diagnosis: Dysphagia  Rehab Potential: Excellent ELOS: 2-3 weeks    Today's Date: 07/12/2014 SLP Individual Time: 1400-1430 SLP Individual Time Calculation (min): 30 min   Problem List:  Patient Active Problem List   Diagnosis Date Noted  . C. difficile colitis 07/11/2014  . Guillain Barr syndrome 07/11/2014  . Acute respiratory failure   . Encounter for orogastric (OG) tube placement   . Acute respiratory acidosis   . History of ETT   . Pain   . Rapidly progressive weakness 06/30/2014  . Acute respiratory failure with hypoxia 06/30/2014  . Protein-calorie malnutrition, severe 06/30/2014  . Ascending paralysis   . Nausea with vomiting   . Back pain, thoracic 06/21/2014  . Nausea and vomiting 06/21/2014  . General weakness 06/21/2014  . Gait disorder 06/21/2014  . Decreased frequency of bowel movements 06/21/2014  . Orthostasis 06/21/2014  . Chronic back pain greater than 3 months duration 06/21/2014  . Dehydration 06/21/2014  . Generalized weakness 06/21/2014  . Skin lesion 05/17/2014  . Low back pain 05/17/2014  . Screen for STD (sexually transmitted disease) 05/17/2014  . Closed low lateral malleolus fracture 03/10/2014  . Acute gouty arthritis 01/12/2014  . Change in bowel habits 08/15/2013  . Hemorrhage of rectum and anus 08/15/2013  . Umbilical hernia 40/98/1191  . Rash 11/25/2010  . Foot pain 11/25/2010  . Preventative health care 11/22/2010  . Uncontrolled type II diabetes mellitus with polyneuropathy 05/20/2010  . MORTON'S NEUROMA 05/20/2010  . PERIPHERAL NEUROPATHY 05/20/2010  . ERECTILE DYSFUNCTION, ORGANIC 05/20/2010  . PERIPHERAL EDEMA 05/20/2010  . HYPERLIPIDEMIA 10/09/2008  . ADD 10/09/2008  . Essential hypertension 10/09/2008  . ALLERGIC RHINITIS 10/09/2008  . Stricture and stenosis of esophagus 02/02/2008  .  COLONIC POLYPS 02/01/2008  . ANXIETY DEPRESSION 02/01/2008  . SLEEP APNEA, OBSTRUCTIVE 02/01/2008  . HEMORRHOIDS 02/01/2008  . GERD 02/01/2008  . HIATAL HERNIA 02/01/2008   Past Medical History:  Past Medical History  Diagnosis Date  . COLONIC POLYPS 02/01/2008  . DIABETES MELLITUS, TYPE II 05/20/2010  . HYPERLIPIDEMIA 10/09/2008  . ANXIETY DEPRESSION 02/01/2008  . ERECTILE DYSFUNCTION 10/09/2008  . ADD 10/09/2008  . SLEEP APNEA, OBSTRUCTIVE 02/01/2008  . MORTON'S NEUROMA 05/20/2010  . PERIPHERAL NEUROPATHY 05/20/2010  . Other specified forms of hearing loss 06/27/2009  . HYPERTENSION 10/09/2008  . HEMORRHOIDS 02/01/2008  . ALLERGIC RHINITIS 10/09/2008  . Stricture and stenosis of esophagus 02/02/2008  . GERD 02/01/2008  . HIATAL HERNIA 02/01/2008  . ERECTILE DYSFUNCTION, ORGANIC 05/20/2010  . WRIST PAIN, LEFT 12/05/2009  . FOOT PAIN, LEFT 05/20/2010  . PERIPHERAL EDEMA 05/20/2010  . DYSPNEA 03/12/2010  . Abdominal pain, unspecified site 01/19/2009  . Abdominal pain, left lower quadrant 06/06/2010  . Type II or unspecified type diabetes mellitus without mention of complication, uncontrolled 11/14/2010   Past Surgical History:  Past Surgical History  Procedure Laterality Date  . Carpal tunnel release    . Rotator cuff repair    . Esophageal dilation  july 2009  . Esophagogastroduodenoscopy N/A 06/27/2014    Procedure: ESOPHAGOGASTRODUODENOSCOPY (EGD);  Surgeon: Lafayette Dragon, MD;  Location: [facility];  Service: Endoscopy;  Laterality: N/A;  . Eye surgery      catract surgery on both eyes    Assessment / Plan / Recommendation Clinical Impression Patient is a 58 y.o. male with history of DM type 2 with peripheral neuropathy, anxiety  disorder, left ankle fracture 02/2014, back and shoulder pain with mild lower extremity weakness since Oct 1st. He was evaluated by ED multiple times diagnosis of PNA as well as urinary retention, N/V with decrease in intake. MRI thoracic spine with left  paracentral disc extrusion at T7-8 with mild cord flattening but no abnormal cord signal or compression deformity and underwent esophageal dilatation and was discharged to SNF on 11/18. He was readmitted on 06/30/14 with rapidly ascending paralysis and severe pain. 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 He was started on IVIG and intubated due to concerns of impending respiratory failure. He tolerated extubation on 11/25 and respiratory status stable. Swallow evaluation done and patient started on dysphagia 2, thin liquids due to generalized weakness. He has had issues with anxiety as well as paranoia requiring IV ativan. Pain has been controlled on IV fentanyl. He developed diarrhea and stool positive for cdiff therefore started on flagyl yesterday. Therapy evaluations done on 11/25 and CIR recommended for follow up therapy. Patient was admitted to CIR on 07/11/14 and was administered a BSE. Patient consumed limited trials of regular textures (due to being full from lunch) and demonstrated prolonged but efficient mastication with a delayed throat clear. Patient reported that prolonged mastication is baseline and that he is "always the last to finish meals." Patient also consumed thin liquids via straw without overt s/s of aspiration and required verbal cues for utilization of small sips. Recommend patient continue current diet of regular textures with thin liquids with intermittent supervision for safety.  Patient would benefit from skilled SLP intervention to ensure toleration of regular textures throughout an entire meal prior to discharge.    Skilled Therapeutic Interventions          Administered a BSE. Please see above for details.   SLP Assessment  Patient will need skilled Speech Lanaguage Pathology Services during CIR admission     Recommendations  Diet Recommendations: Thin liquid;Regular Liquid Administration via: Straw Medication Administration: Whole meds with liquid (or patient preference ) Supervision: Patient able to self feed;Intermittent supervision to cue for compensatory strategies Compensations: Slow rate;Small sips/bites Postural Changes and/or Swallow Maneuvers: Seated upright 90 degrees Oral Care Recommendations: Oral care BID Recommendations for Other Services: Neuropsych consult Patient destination: Home Follow up Recommendations: None Equipment Recommended: None recommended by SLP    SLP Frequency 5 out of 7 days   SLP Treatment/Interventions Dysphagia/aspiration precaution training;Patient/family education    Pain No reports of pain   Short Term Goals: Week 1: SLP Short Term Goal 1 (Week 1): Patient will consume regular textures with thin liquids via straw without overt s/s of aspiration with Mod I.   See FIM for current functional status Refer to Care Plan for Long Term Goals  Recommendations for other services: Neuropsych  Discharge Criteria: Patient will be discharged from SLP if patient refuses treatment 3 consecutive times without medical reason, if treatment goals not met, if there is a change in medical status, if patient makes no progress towards goals or if patient is discharged from hospital.  The above assessment, treatment plan, treatment alternatives and goals were discussed and mutually agreed upon: by patient  Beth Spackman 07/12/2014, 2:51 PM

## 2014-07-12 NOTE — Evaluation (Signed)
Physical Therapy Assessment and Plan  Patient Details  Name: Frank [name] MRN: [id number] Date of Birth: [birth year]  PT Diagnosis: Abnormal posture, Abnormality of gait, Ataxia, Cognitive deficits, Coordination disorder, Impaired sensation and Muscle weakness Rehab Potential: Good ELOS: 17-21 days   Today's Date: 07/12/2014 PT Individual Time: 0800-0900 PT Individual Time Calculation (min): 60 min    Problem List:  Patient Active Problem List   Diagnosis Date Noted  . C. difficile colitis 07/11/2014  . Guillain Barr syndrome 07/11/2014  . Acute respiratory failure   . Encounter for orogastric (OG) tube placement   . Acute respiratory acidosis   . History of ETT   . Pain   . Rapidly progressive weakness 06/30/2014  . Acute respiratory failure with hypoxia 06/30/2014  . Protein-calorie malnutrition, severe 06/30/2014  . Ascending paralysis   . Nausea with vomiting   . Back pain, thoracic 06/21/2014  . Nausea and vomiting 06/21/2014  . General weakness 06/21/2014  . Gait disorder 06/21/2014  . Decreased frequency of bowel movements 06/21/2014  . Orthostasis 06/21/2014  . Chronic back pain greater than 3 months duration 06/21/2014  . Dehydration 06/21/2014  . Generalized weakness 06/21/2014  . Skin lesion 05/17/2014  . Low back pain 05/17/2014  . Screen for STD (sexually transmitted disease) 05/17/2014  . Closed low lateral malleolus fracture 03/10/2014  . Acute gouty arthritis 01/12/2014  . Change in bowel habits 08/15/2013  . Hemorrhage of rectum and anus 08/15/2013  . Umbilical hernia 94/70/9628  . Rash 11/25/2010  . Foot pain 11/25/2010  . Preventative health care 11/22/2010  . Uncontrolled type II diabetes mellitus with polyneuropathy 05/20/2010  . MORTON'S NEUROMA 05/20/2010  . PERIPHERAL NEUROPATHY 05/20/2010  . ERECTILE DYSFUNCTION, ORGANIC 05/20/2010  . PERIPHERAL EDEMA 05/20/2010  . HYPERLIPIDEMIA 10/09/2008  . ADD 10/09/2008  . Essential hypertension  10/09/2008  . ALLERGIC RHINITIS 10/09/2008  . Stricture and stenosis of esophagus 02/02/2008  . COLONIC POLYPS 02/01/2008  . ANXIETY DEPRESSION 02/01/2008  . SLEEP APNEA, OBSTRUCTIVE 02/01/2008  . HEMORRHOIDS 02/01/2008  . GERD 02/01/2008  . HIATAL HERNIA 02/01/2008    Past Medical History:  Past Medical History  Diagnosis Date  . COLONIC POLYPS 02/01/2008  . DIABETES MELLITUS, TYPE II 05/20/2010  . HYPERLIPIDEMIA 10/09/2008  . ANXIETY DEPRESSION 02/01/2008  . ERECTILE DYSFUNCTION 10/09/2008  . ADD 10/09/2008  . SLEEP APNEA, OBSTRUCTIVE 02/01/2008  . MORTON'S NEUROMA 05/20/2010  . PERIPHERAL NEUROPATHY 05/20/2010  . Other specified forms of hearing loss 06/27/2009  . HYPERTENSION 10/09/2008  . HEMORRHOIDS 02/01/2008  . ALLERGIC RHINITIS 10/09/2008  . Stricture and stenosis of esophagus 02/02/2008  . GERD 02/01/2008  . HIATAL HERNIA 02/01/2008  . ERECTILE DYSFUNCTION, ORGANIC 05/20/2010  . WRIST PAIN, LEFT 12/05/2009  . FOOT PAIN, LEFT 05/20/2010  . PERIPHERAL EDEMA 05/20/2010  . DYSPNEA 03/12/2010  . Abdominal pain, unspecified site 01/19/2009  . Abdominal pain, left lower quadrant 06/06/2010  . Type II or unspecified type diabetes mellitus without mention of complication, uncontrolled 11/14/2010   Past Surgical History:  Past Surgical History  Procedure Laterality Date  . Carpal tunnel release    . Rotator cuff repair    . Esophageal dilation  july 2009  . Esophagogastroduodenoscopy N/A 06/27/2014    Procedure: ESOPHAGOGASTRODUODENOSCOPY (EGD);  Surgeon: Lafayette Dragon, MD;  Location: [facility];  Service: Endoscopy;  Laterality: N/A;  . Eye surgery      catract surgery on both eyes    Assessment & Plan Clinical Impression: Frank [name] is a 58  y.o. male with history of DM type 2 with peripheral neuropathy, anxiety disorder, left ankle fracture 02/2014,  back and shoulder pain with mild lower extremity weakness since Oct 1st. He was evaluated by ED multiple times diagnosis of PNA as  well as urinary retention, N/V with decrease in intake. MRI thoracic spine with left paracentral disc extrusion at T7-8 with mild cord flattening but no abnormal cord signal or compression deformity and underwent esophageal dilatation and was discharged to SNF on 11/18. He was readmitted on 06/30/14 with rapidly ascending paralysis and severe pain. 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 He was started on IVIG and intubated due to concerns of impending respiratory failure. He tolerated extubation on 11/25 and respiratory status stable. Swallow evaluation done and patient started on dysphagia 2, thin liquids due to generalized weakness. He has had issues with anxiety as well as paranoia requiring IV ativan. Pain has been controlled on IV fentanyl. He developed diarrhea and stool positive for cdiff therefore started on flagyl yesterday. Therapy evaluations done on 11/25 and CIR recommended for follow up therapy. Patient transferred to CIR on 07/11/2014 .   Patient currently requires max with mobility secondary to muscle weakness, decreased cardiorespiratoy endurance, decreased awareness and decreased memory and decreased sitting balance and decreased standing balance.  Prior to hospitalization, patient was modified independent  with mobility and lived with Significant other in a House home.  Home access is 3Stairs to enter.  Patient will benefit from skilled PT intervention to maximize safe functional mobility, minimize fall risk and decrease caregiver burden for planned discharge home with intermittent assist.  Anticipate patient will benefit from follow up [facility] at discharge.     Skilled Therapeutic Intervention Session focused on functional evaluation, orientation to rehab unit, falls risk and safety in room. Pt overall min-ModA for bed  mobility, Mod-Max for transfers, +3A for ambulation (+2 HHA, +3 for close w/c follow). Pt very limited by generalized ataxia in trunk>limbs, dyskinesia, decreased endurance, decreased strength proximal>distal. Recommend nursing use STEDY lift for transfers.  PT Evaluation Precautions/Restrictions Precautions Precautions: Fall Precaution Comments: h/o multiple falls Restrictions Weight Bearing Restrictions: No General Chart Reviewed: Yes Vital SignsTherapy Vitals Temp: 98.4 F (36.9 C) Temp Source: Oral Pulse Rate: 79 Resp: 19 BP: 108/61 mmHg Patient Position (if appropriate): Lying Oxygen Therapy SpO2: 99 % O2 Device: Not Delivered Pain Pain Assessment Pain Assessment: 0-10 Pain Score: 8  Pain Type: Neuropathic pain Pain Location: Leg Pain Orientation: Right;Left Pain Intervention(s):  (Pt received pain medication from RN during session) Home Living/Prior Functioning Home Living Available Help at Discharge: Available PRN/intermittently Type of Home: House Home Access: Stairs to enter Technical brewer of Steps: 3 Entrance Stairs-Rails: Right;Left;Can reach both Home Layout: One level Additional Comments: Pt's partner works during day,   Lives With: Significant other Prior Function Level of Independence: Requires assistive device for independence  Able to Take Stairs?:  (Increasing difficulty with stairs PTA) Driving: Yes Vocation:  (Working until ankle fx in July) Vocation Requirements: 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 Vision/Perception     Cognition Overall Cognitive Status: No family/caregiver present to determine baseline cognitive functioning Arousal/Alertness: Awake/alert Orientation Level: Oriented X4 Behaviors: Restless Sensation Sensation Light Touch: Impaired Detail Light Touch Impaired Details: Impaired RUE;Impaired LUE;Impaired RLE;Impaired LLE Additional Comments: Impaired in stocking glove distribution bilaterally Coordination Gross  Motor  Movements are Fluid and Coordinated: No Finger Nose Finger Test: Dymsetria and dyskinetic movements L>R Heel Shin Test: Dymsetria and dyskinetic movements L>R Motor  Motor Motor: Ataxia Motor - Skilled Clinical Observations: Ataxic in trunk, proximal muscle movements  Mobility Bed Mobility Bed Mobility: Rolling Right;Rolling Left;Supine to Sit;Sit to Supine Rolling Right: 4: Min assist Rolling Right Details (indicate cue type and reason): Assist to complete roll Rolling Left: 4: Min assist Rolling Left Details (indicate cue type and reason): Assist to complete roll Supine to Sit: 3: Mod assist Supine to Sit Details: Manual facilitation for weight shifting Supine to Sit Details (indicate cue type and reason): Assist to bring trunk up to sit Transfers Transfers: Yes Sit to Stand: 2: Max assist Sit to Stand Details: Manual facilitation for weight shifting;Manual facilitation for weight bearing Sit to Stand Details (indicate cue type and reason): MaxA to remain standing Stand to Sit: 3: Mod assist Squat Pivot Transfers: 2: Max Teacher, English as a foreign language Transfer Details: Manual facilitation for placement;Manual facilitation for weight bearing Locomotion     Trunk/Postural Assessment  Cervical Assessment Cervical Assessment: Within Functional Limits Thoracic Assessment Thoracic Assessment: Exceptions to [facility] (decreased trunk control in sitting and standing) Lumbar Assessment Lumbar Assessment: Exceptions to [facility] (decreased trunk control in sitting and standing) Postural Control Postural Control: Deficits on evaluation (decreased trunk control in sitting and standing)  Balance Balance Balance Assessed: Yes Static Sitting Balance Static Sitting - Balance Support: Bilateral upper extremity supported (Intermittent 1 and 0 UE support) Static Sitting - Level of Assistance: 5: Stand by assistance Dynamic Sitting Balance Dynamic Sitting - Balance Support: During functional  activity;Bilateral upper extremity supported (Intermittent 1 and 0 UE support) Dynamic Sitting - Level of Assistance: 4: Min Insurance risk surveyor Standing - Balance Support: Bilateral upper extremity supported Static Standing - Level of Assistance: 2: Max assist Extremity Assessment      RLE Assessment RLE Assessment: Exceptions to [facility] RLE Strength RLE Overall Strength: Deficits RLE Overall Strength Comments: hip and knee 3+/5, ankle DF 2+/5, PF 3+/5 LLE Assessment LLE Assessment: Exceptions to [facility] LLE Strength LLE Overall Strength: Deficits LLE Overall Strength Comments: hip and knee overall 3/5, ankle 2+/5  FIM:  FIM - Bed/Chair Transfer Bed/Chair Transfer: 3: Supine > Sit: Mod A (lifting assist/Pt. 50-74%/lift 2 legs;2: Bed > Chair or W/C: Max A (lift and lower assist);2: Chair or W/C > Bed: Max A (lift and lower assist) FIM - Locomotion: Wheelchair Locomotion: Wheelchair: 0: Activity did not occur FIM - Locomotion: Ambulation Locomotion: Ambulation: 0: Activity did not occur FIM - Locomotion: Stairs Locomotion: Stairs: 0: Activity did not occur   Refer to Care Plan for Long Term Goals  Recommendations for other services: Neuropsych  Discharge Criteria: Patient will be discharged from PT if patient refuses treatment 3 consecutive times without medical reason, if treatment goals not met, if there is a change in medical status, if patient makes no progress towards goals or if patient is discharged from hospital.  The above assessment, treatment plan, treatment alternatives and goals were discussed and mutually agreed upon: by patient  Rada Hay 07/12/2014, 8:58 AM

## 2014-07-12 NOTE — Progress Notes (Signed)
Expand All Collapse All        Physical Medicine and Rehabilitation Consult   Reason for Consult: Ascending weakness due to GBS Referring Physician: Dr. Titus Mould.    HPI: Frank [name] is a 58 y.o. male with history of DM type 2 with peripheral neuropathy, anxiety disorder, left ankle fracture 02/2014, back and shoulder pain with mild lower extremity weakness since Oct 1st. He was evaluated by ED multiple times diagnosis of PNA as well as urinary retention, N/V with decrease in intake. MRI thoracic spine with left paracentral disc extrusion at T7-8 with mild cord flattening but no abnormal cord signal or compression deformity and underwent esophageal dilatation and was discharged to SNF on 11/18. He was readmitted on 06/30/14 with rapidly ascending paralysis and severe pain. 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 He was started on IVIG and intubated due to concerns of impending respiratory failure. He tolerated extubation on 11/25 and respiratory status stable. Swallow evaluation done and patient started on dysphagia 2, thin liquids due to generalized weakness. He has had issues with anxiety as well as paranoia. Therapy evaluations done on 11/25 and CIR recommended for follow up therapy.   Patient just received IV fentanyl and Ativan for anxiety and low back pain Review of Systems  HENT: Negative.  Eyes: Negative.  Respiratory: Negative.  Cardiovascular: Negative.  Gastrointestinal: Negative.  Genitourinary: Negative.  Musculoskeletal: Positive for back pain.  Skin: Negative.  Neurological: Positive for sensory change and weakness.  Endo/Heme/Allergies: Negative.  Psychiatric/Behavioral: Positive for memory loss.   Past Medical History  Diagnosis Date  . COLONIC POLYPS 02/01/2008  . DIABETES  MELLITUS, TYPE II 05/20/2010  . HYPERLIPIDEMIA 10/09/2008  . ANXIETY DEPRESSION 02/01/2008  . ERECTILE DYSFUNCTION 10/09/2008  . ADD 10/09/2008  . SLEEP APNEA, OBSTRUCTIVE 02/01/2008  . MORTON'S NEUROMA 05/20/2010  . PERIPHERAL NEUROPATHY 05/20/2010  . Other specified forms of hearing loss 06/27/2009  . HYPERTENSION 10/09/2008  . HEMORRHOIDS 02/01/2008  . ALLERGIC RHINITIS 10/09/2008  . Stricture and stenosis of esophagus 02/02/2008  . GERD 02/01/2008  . HIATAL HERNIA 02/01/2008  . ERECTILE DYSFUNCTION, ORGANIC 05/20/2010  . WRIST PAIN, LEFT 12/05/2009  . FOOT PAIN, LEFT 05/20/2010  . PERIPHERAL EDEMA 05/20/2010  . DYSPNEA 03/12/2010  . Abdominal pain, unspecified site 01/19/2009  . Abdominal pain, left lower quadrant 06/06/2010  . Type II or unspecified type diabetes mellitus without mention of complication, uncontrolled 11/14/2010   Past Surgical History  Procedure Laterality Date  . Carpal tunnel release    . Rotator cuff repair    . Esophageal dilation  july 2009  . Eye surgery    . Esophagogastroduodenoscopy N/A 06/27/2014    Procedure: ESOPHAGOGASTRODUODENOSCOPY (EGD); Surgeon: Lafayette Dragon, MD; Location: [facility]; Service: Endoscopy; Laterality: N/A;   Family History  Problem Relation Age of Onset  . Diabetes Mother   . Heart disease Mother   . Hyperlipidemia Mother   . Depression Mother   . Diabetes Brother   . Colon cancer Neg Hx    Social History:  reports that he has never smoked. He has never used smokeless tobacco. He reports that he drinks alcohol. He reports that he uses illicit drugs (Methaqualone). Allergies:  Allergies  Allergen Reactions  . Codeine Itching    REACTION: Itching  . Metformin And Related Other (See Comments)    GI  upset, diarrhea   Medications Prior to Admission  Medication Sig Dispense Refill  .  allopurinol (ZYLOPRIM) 100 MG tablet Take 2 tablets (200 mg total) by mouth daily. 30 tablet 6  . amLODipine (NORVASC) 10 MG tablet Take 1 tablet (10 mg total) by mouth daily. 90 tablet 3  . atorvastatin (LIPITOR) 10 MG tablet Take 10 mg by mouth daily.    . clonazePAM (KLONOPIN) 0.5 MG tablet Take 1 tablet (0.5 mg total) by mouth 2 (two) times daily as needed for anxiety. 30 tablet 0  . colchicine 0.6 MG tablet Take 1 tablet (0.6 mg total) by mouth 2 (two) times daily. 10 tablet 0  . DULoxetine (CYMBALTA) 30 MG capsule Take 30 mg by mouth daily.     Marland Kitchen glipiZIDE (GLUCOTROL XL) 2.5 MG 24 hr tablet Take 1 tablet (2.5 mg total) by mouth daily with breakfast. 90 tablet 3  . hydrALAZINE (APRESOLINE) 10 MG tablet Take 1 tablet (10 mg total) by mouth 3 (three) times daily. 90 tablet 1  . levofloxacin (LEVAQUIN) 500 MG tablet Take 1 tablet (500 mg total) by mouth daily. 5 tablet 0  . meloxicam (MOBIC) 15 MG tablet Take 1 tablet (15 mg total) by mouth daily. 30 tablet 0  . methylphenidate 54 MG PO CR tablet Take 54 mg by mouth 2 (two) times daily.    . metoCLOPramide (REGLAN) 5 MG tablet Take 1 tablet (5 mg total) by mouth 4 (four) times daily - before meals and at bedtime. 90 tablet 1  . NUCYNTA 100 MG TABS Take 1 tablet by mouth 3 (three) times daily.     Marland Kitchen oxyCODONE-acetaminophen (PERCOCET) 7.5-325 MG per tablet Take 1 tablet by mouth every 6 (six) hours as needed for pain. 20 tablet 0  . pantoprazole (PROTONIX) 40 MG tablet Take 1 tablet (40 mg total) by mouth daily. 30 tablet 0  . pregabalin (LYRICA) 200 MG capsule Take 200 mg by mouth 2 (two) times daily.    Marland Kitchen glucose blood (FREESTYLE TEST STRIPS) test strip Use as instructed 100 each 12  . ondansetron (ZOFRAN) 4 MG tablet Take 1 tablet (4 mg total) by mouth 2 (two) times daily. (Patient not taking: Reported on 06/30/2014) 60 tablet 1  . ondansetron (ZOFRAN) 4  MG tablet Take 1 tablet (4 mg total) by mouth every 8 (eight) hours as needed for nausea or vomiting. (Patient not taking: Reported on 06/30/2014) 90 tablet 0    Home: Home Living Family/patient expects to be discharged to:: Inpatient rehab Living Arrangements: Spouse/significant other  Functional History: Prior Function Comments: Unclear true baseline as pt has been having progressive weakness and increasing frequency of falls per chart.  Functional Status:  Mobility: Bed Mobility Overal bed mobility: Needs Assistance, +2 for physical assistance Bed Mobility: Supine to Sit Supine to sit: Mod assist, +2 for physical assistance, HOB elevated General bed mobility comments: pt able to initiate movement in LEs, but requires A for directing movement and for bringing trunk up to sitting.  Transfers Overall transfer level: Needs assistance Equipment used: 2 person hand held assist Transfers: Sit to/from Stand, Stand Pivot Transfers Sit to Stand: Max assist, +2 physical assistance Stand pivot transfers: Total assist, +2 physical assistance General transfer comment: pt able to come to stand, but seems to get anxious once standing. Repeated coming to stand x2. pt needs facilitation for positioning of LEs and blocking feet prior to coming to stand and then increased A needed to complete pivot to recliner.  ADL:    Cognition: Cognition Overall Cognitive Status: Impaired/Different from baseline Orientation Level: Oriented X4 Cognition Arousal/Alertness: Awake/alert Behavior During Therapy: Flat affect Overall Cognitive Status: Impaired/Different from baseline Area of Impairment: Orientation, Attention, Memory, Following commands, Safety/judgement, Awareness, Problem solving Orientation Level: Disoriented to, Situation (pt appropriately found calendar in room for day and date. ) Current Attention Level: Focused Memory: Decreased short-term memory Following Commands:  Follows one step commands inconsistently Safety/Judgement: Decreased awareness of safety, Decreased awareness of deficits Awareness: Intellectual Problem Solving: Slow processing, Decreased initiation, Difficulty sequencing, Requires verbal cues, Requires tactile cues General Comments: pt makes off topic comments occasionally and needs cueing to re-orient to task. pt indicating being "afraid of the light" when PT mentions turning on a light, but denies being sensitive to the light. No family present to A with determining baseline cognition.   Blood pressure 149/72, pulse 93, temperature 98.1 F (36.7 C), temperature source Oral, resp. rate 22, height 5\' 9"  (1.753 m), weight 80.332 kg (177 lb 1.6 oz), SpO2 100 %. Physical Exam  Nursing note and vitals reviewed. Constitutional: He is oriented to person, place, and time. He appears well-developed and well-nourished.  HENT:  Head: Normocephalic and atraumatic.  Eyes: Conjunctivae and EOM are normal. Pupils are equal, round, and reactive to light.  Neck: Normal range of motion.  Cardiovascular: Normal rate, regular rhythm and normal heart sounds.  Respiratory: Effort normal and breath sounds normal.  GI: Soft. Bowel sounds are normal.  Musculoskeletal:  Normal passive range of motion, active range of motion reduced by weakness  Neurological: He is alert and oriented to person, place, and time. No cranial nerve deficit. Coordination abnormal.  Reflex Scores:  Patellar reflexes are 0 on the right side and 0 on the left side.  Achilles reflexes are 0 on the right side and 0 on the left side. Psychiatric: He has a normal mood and affect.  Motor: 3 minus bilateral deltoid 4 minus biceps triceps and grip 3 minus bilateral hip flexors 3 minus knee extensors 3 minus ankle dorsiflexor plantar flexor Cerebellar: Ataxia finger-nose-finger moderate bilateral upper extremities Heel-to-shin intact but done very slowly Sensation reduced to  light touch and proprioception lower extremities greater than upper extremities   Lab Results Last 24 Hours    Results for orders placed or performed during the hospital encounter of 06/30/14 (from the past 24 hour(s))  Glucose, capillary Status: Abnormal   Collection Time: 07/06/14 12:17 PM  Result Value Ref Range   Glucose-Capillary 273 (H) 70 - 99 mg/dL  Basic metabolic panel Status: Abnormal   Collection Time: 07/07/14 4:14 AM  Result Value Ref Range   Sodium 139 137 - 147 mEq/L   Potassium 3.6 (L) 3.7 - 5.3 mEq/L   Chloride 97 96 - 112 mEq/L   CO2 27 19 - 32 mEq/L   Glucose, Bld 152 (H) 70 - 99 mg/dL   BUN 31 (H) 6 - 23 mg/dL   Creatinine, Ser 1.04 0.50 - 1.35 mg/dL   Calcium 9.9 8.4 - 10.5 mg/dL   GFR calc non Af Amer 76 (L) >90 mL/min   GFR calc Af Amer 88 (L) >90 mL/min   Anion gap 15 5 - 15  CBC Status: Abnormal   Collection Time: 07/07/14 4:14 AM  Result Value Ref Range   WBC 9.1 4.0 - 10.5 K/uL   RBC 4.24 4.22 - 5.81 MIL/uL   Hemoglobin 13.0 13.0 - 17.0 g/dL   HCT 37.5 (L) 39.0 - 52.0 %  MCV 88.4 78.0 - 100.0 fL   MCH 30.7 26.0 - 34.0 pg   MCHC 34.7 30.0 - 36.0 g/dL   RDW 12.8 11.5 - 15.5 %   Platelets 336 150 - 400 K/uL      Imaging Results (Last 48 hours)    No results found.    Assessment/Plan: Diagnosis: Guillain Barr syndrome With tetra paresis as well as sensory deficits 1. Does the need for close, 24 hr/day medical supervision in concert with the patient's rehab needs make it unreasonable for this patient to be served in a less intensive setting? Yes 2. Co-Morbidities requiring supervision/potential complications: Thoracic disc with chronic mid back pain. 3. Due to bladder management, bowel management, safety, skin/wound care, disease management, medication administration, pain management and patient education, does the patient require  24 hr/day rehab nursing? Yes 4. Does the patient require coordinated care of a physician, rehab nurse, PT (1-2 hrs/day, 5 days/week) and OT (11-2 hrs/day, 5 days/week) to address physical and functional deficits in the context of the above medical diagnosis(es)? Yes Addressing deficits in the following areas: balance, endurance, locomotion, strength, transferring, bowel/bladder control, bathing, dressing, feeding, grooming and toileting 5. Can the patient actively participate in an intensive therapy program of at least 3 hrs of therapy per day at least 5 days per week? Yes 6. The potential for patient to make measurable gains while on inpatient rehab is good 7. Anticipated functional outcomes upon discharge from inpatient rehab are min assist with PT, min assist with OT, n/a with SLP. 8. Estimated rehab length of stay to reach the above functional goals is: 20-24 days 9. Does the patient have adequate social supports and living environment to accommodate these discharge functional goals? Yes 10. Anticipated D/C setting: Home 11. Anticipated post D/C treatments: [place] therapy 12. Overall Rehab/Functional Prognosis: good  RECOMMENDATIONS: This patient's condition is appropriate for continued rehabilitative care in the following setting: CIR Patient has agreed to participate in recommended program. Potentially Note that insurance prior authorization may be required for reimbursement for recommended care.  Comment: Uncertain whether patient has 24 7 caregiver, Was in a skilled nursing facility for 3 days prior to admission had been living at home prior to that time Need to get patient off IV pain medication as well as IV anxiolytics    07/07/2014       Revision History

## 2014-07-12 NOTE — Progress Notes (Signed)
Site care done to temporary HD catheter in patient's right IJ.  Noted that IJ catheter was last flushed on 11/25 with Na Citrate instead of 1,000 units/cc heparin .   Spoke with staff RN who will contact physician to clarify this   Flushed all 3 ports with 10cc saline, noting good blood return until issue is clarified.

## 2014-07-12 NOTE — Progress Notes (Signed)
Nursing Note: Pt arrived at [facility] while this nurse was getting report.I received report form Ed,RN and then rounded on pt.Pt was resting quietly in bed w/ a visitor at the bedside assisting him to wash his hands.No complaints at this time but wanted to know when he was due for some pain medicine again.wbb

## 2014-07-12 NOTE — Progress Notes (Signed)
Nursing Note: Pt asleep.wbb 

## 2014-07-13 ENCOUNTER — Ambulatory Visit (HOSPITAL_COMMUNITY): Payer: BC Managed Care – PPO | Admitting: Speech Pathology

## 2014-07-13 ENCOUNTER — Encounter (HOSPITAL_COMMUNITY): Payer: BC Managed Care – PPO | Admitting: Occupational Therapy

## 2014-07-13 ENCOUNTER — Inpatient Hospital Stay (HOSPITAL_COMMUNITY): Payer: BC Managed Care – PPO

## 2014-07-13 LAB — GLUCOSE, CAPILLARY
Glucose-Capillary: 87 mg/dL (ref 70–99)
Glucose-Capillary: 86 mg/dL (ref 70–99)
Glucose-Capillary: 74 mg/dL (ref 70–99)
Glucose-Capillary: 88 mg/dL (ref 70–99)

## 2014-07-13 MED ORDER — SODIUM CHLORIDE 0.9 % IJ SOLN
10.0000 mL | INTRAMUSCULAR | Status: DC | PRN
  Administered 2014-07-13 (×2): 10 mL
  Filled 2014-07-13: qty 40

## 2014-07-13 MED ORDER — METHYLPHENIDATE HCL ER 18 MG PO TB24
36.0000 mg | ORAL_TABLET | Freq: Every day | ORAL | Status: DC
  Administered 2014-07-13 – 2014-07-29 (×17): 36 mg via ORAL
  Filled 2014-07-13 (×17): qty 2

## 2014-07-13 MED ORDER — SODIUM CHLORIDE 0.9 % IJ SOLN: 10.0000 mL | Freq: Two times a day (BID) | INTRAMUSCULAR | Status: DC

## 2014-07-13 NOTE — Plan of Care (Signed)
Problem: RH SAFETY Goal: RH STG ADHERE TO SAFETY PRECAUTIONS W/ASSISTANCE/DEVICE STG Adhere to Safety Precautions With Assistance/Device. independent  Outcome: Progressing Goal: RH STG DECREASED RISK OF FALL WITH ASSISTANCE STG Decreased Risk of Fall With min Assistance.  Outcome: Progressing Goal: RH STG DEMO UNDERSTANDING HOME SAFETY PRECAUTIONS Pt able to verbalize understanding and demonstrate safe mobility by d/c.Min assist w/ transfers .  Outcome: Progressing

## 2014-07-13 NOTE — Progress Notes (Signed)
[facility]ILIT[facility]     PROGRESS NOTE    Subjective/Complaints: Uneventful night. Slept fairly well. Feels "sleepy" after he takes oxycodone. Would like to go back on ritalin. Complains of pain along medial arch of right foot A  review of systems has been performed and if not noted above is otherwise negative.   Objective: Vital Signs: Blood pressure 111/71, pulse 73, temperature 98.5 F (36.9 C), temperature source Oral, resp. rate 18, weight 83.4 kg (183 lb 13.8 oz), SpO2 100 %. No results found.  Recent Labs  07/11/14 0458 07/12/14 0455  WBC 6.3 5.8  HGB 10.8* 11.3*  HCT 32.3* 32.9*  PLT 308 316    Recent Labs  07/11/14 0458 07/12/14 0455  NA 137 143  K 4.0 4.2  CL 100 104  GLUCOSE 110* 101*  BUN 17 16  CREATININE 0.89 0.88  CALCIUM 9.3 9.5   CBG (last 3)   Recent Labs  07/12/14 1650 07/12/14 2115 07/13/14 0700  GLUCAP 86 161* 87    Wt Readings from Last 3 Encounters:  07/13/14 83.4 kg (183 lb 13.8 oz)  07/10/14 81.693 kg (180 lb 1.6 oz)  07/02/14 85.276 kg (188 lb)    Physical Exam:  Constitutional: He is oriented to person, place, and time. He appears well-developed. No distress.  HENT:  Head: Normocephalic and atraumatic.  Eyes: Conjunctivae and EOM are normal. Pupils are equal, round, and reactive to light.  Neck: No JVD present. No tracheal deviation present. No thyromegaly present.  Cardiovascular: Normal rate and regular rhythm.  Respiratory: No respiratory distress. He has no wheezes. He has no rales. He exhibits no tenderness.  GI: He exhibits no distension. There is no tenderness. There is no rebound and no guarding.  Musculoskeletal: He exhibits no edema or tenderness.  Lymphadenopathy:   He has no cervical adenopathy.  Neurological: He is alert and oriented to person, place, and time.  Diffuse sensory loss distal greater than proximal in stocking glove distribution, inconsistent in all 4 limbs, lower more  affected than uppers. Motor: 4/5 deltoid, bicep, tricep, hands. LE: 3/5 hf, 3+ ke and 4/5 ankles--not consistent with effort, tended to be anxious. DTR's 1+ to absent throughout  Skin: Skin is warm. He is not diaphoretic.  Psychiatric:  Flat, slow to arouse  Assessment/Plan: 1. 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 Physiatrist is providing close team supervision and 24 hour management of active medical problems listed below. Physiatrist and rehab team continue to assess barriers to discharge/monitor patient progress toward functional and medical goals. FIM: FIM - Bathing Bathing Steps Patient Completed: Chest, Right Arm, Left Arm, Abdomen, Front perineal area (UB bathing only this session) Bathing: 3: Mod-Patient completes 5-7 27f 10 parts or 50-74%  FIM - Upper Body Dressing/Undressing Upper body dressing/undressing steps patient completed: Thread/unthread right sleeve of pullover shirt/dresss, Thread/unthread left sleeve of pullover shirt/dress, Put head through opening of pull over shirt/dress, Pull shirt over trunk Upper body dressing/undressing: 4: Steadying assist FIM - Lower Body Dressing/Undressing Lower body dressing/undressing steps patient completed: Thread/unthread right pants leg, Thread/unthread left pants leg Lower body dressing/undressing: 2: Max-Patient completed 25-49% of tasks  FIM - Toileting Toileting steps completed by patient: Performs perineal hygiene Toileting Assistive Devices: Grab bar or rail for support Toileting: 2: Max-Patient completed 1 of 3 steps  FIM - Radio producer Devices: Bedside commode, Grab bars (drop arm)  Toilet Transfers: 2-To toilet/BSC: Max A (lift and lower assist), 1-Two helpers  FIM - IT sales professional Transfer: 0: Activity did not occur  FIM - Locomotion: Wheelchair Distance:  100 Locomotion: Wheelchair: 2: Travels 50 - 149 ft with moderate assistance (Pt: 50 - 74%) FIM - Locomotion: Ambulation Locomotion: Ambulation Assistive Devices: Other (comment) (2 person hand held assist) Ambulation/Gait Assistance: 1: +2 Total assist (+3 for w/c follow for safety) Locomotion: Ambulation: 1: Two helpers  Comprehension Comprehension Mode: Auditory Comprehension: 5-Follows basic conversation/direction: With extra time/assistive device  Expression Expression Mode: Verbal Expression: 5-Expresses basic needs/ideas: With no assist  Social Interaction Social Interaction: 6-Interacts appropriately with others with medication or extra time (anti-anxiety, antidepressant).  Problem Solving Problem Solving: 5-Solves basic 90% of the time/requires cueing < 10% of the time  Memory Memory: 5-Recognizes or recalls 90% of the time/requires cueing < 10% of the time  Medical Problem List and Plan: 1. Functional deficits secondary to Guillain Barre Syndrome (vs CIDP) in patient with chronic peipheral neuropathy 2. DVT Prophylaxis/Anticoagulation: Pharmaceutical: Lovenox 3. Chronic Pain Management: Was on nucynta 100 mg tid PTA.  -substitute oxycodone for nucynta (which isn't on formulary) -lyrica and cymbalta 4. Anxiety/depression/Mood: Continue Cymbalta for mood stabilization. Will continue ativan po prn for anxiety. LCSW to follow for evaluation and support.   -resumed concerta (lower than his presumed home dose for now) 5. Neuropsych: This patient is capable of making decisions on his own behalf. 6. Skin/Wound Care: Routine pressure relief measures 7. Fluids/Electrolytes/Nutrition: Monitor I/O. BMET within normal limits today 8. HTN: Blood pressure controlled off medications.  9. Peripheral neuropathy: Continue lyrica bid with Cymbalta.  10. DM type 2: Continue to check BS ac/hs basis with SSI for elevated BS. May need glucotrol resumed as po  intake improves.   -for now sugars are controlled 11. Anemia: hgb up to 11.3  10 OSA: no new issues 11. GERD s/p recent dilatation of esophageal stricture:  12. C diff colitis: Flagyl through 12/14 --no more loose stools    LOS (Days) 2 A FACE TO FACE EVALUATION WAS PERFORMED  Frank [name] T 07/13/2014 8:52 AM

## 2014-07-13 NOTE — Plan of Care (Signed)
Problem: RH Swallowing Goal: LTG Patient will consume least restrictive PO diet (SLP) LTG: Patient will consume least restrictive PO diet with assist for use of compensatory strategies (SLP)  Outcome: Completed/Met Date Met:  07/13/14

## 2014-07-13 NOTE — Progress Notes (Signed)
Speech Language Pathology Discharge Summary  Patient Details  Name: Frank [name] MRN: [id number] Date of Birth: Jan 06, 1956  Today's Date: 07/13/2014 SLP Individual Time: 1130-1230 SLP Individual Time Calculation (min): 60 min   Skilled Therapeutic Interventions:  Pt was seen for skilled ST targeting dysphagia goals.  Upon arrival, pt was seated upright in wheelchair, awake, alert, and pleasantly interactive.  SLP completed skilled observations during presentations of pt's currently prescribed diet with pt exhibiting no oral residuals and no overt s/s of aspiration at bedside.  Pt required intermittent assistance for cutting food and manipulating utensils due to decreased fine motor control but was otherwise mod I for managing his swallowing precautions.  SLP reviewed and reinforced education related to standard esophageal precautions including small bites/sips, alternating solid and liquid consistencies, and sitting upright for at least 30-60 minutes following meals as pt reported increased reflux yesterday.  All pt's questions were answered to his satisfaction at this time.     Patient has met 1 of 1 long term goals.  Patient to discharge at overall Modified Independent level.  Reasons goals not met: n/a   Clinical Impression/Discharge Summary:  Pt met all treatment goals and is discharging from [place] services at a mod I level for use of universal swallowing precautions to minimize overt s/s of aspiration with regular solids and thin liquids.  Pt education is complete at this time.  No further ST needs are indicated.       Recommendation:  None   Equipment: none recommended by SLP    Reasons for discharge: Treatment goals met   Patient/Family Agrees with Progress Made and Goals Achieved: Yes   See FIM for current functional status  Windell Moulding, M.A. CCC-SLP  Jakira Mcfadden, Selinda Orion 07/13/2014, 1:27 PM

## 2014-07-13 NOTE — Plan of Care (Signed)
Problem: Consults Goal: RH GENERAL PATIENT EDUCATION See Patient Education module for education specifics.  Outcome: Progressing Goal: Skin Care Protocol Initiated - if Braden Score 18 or less If consults are not indicated, leave blank or document N/A  Outcome: Progressing  Problem: RH SAFETY Goal: RH STG ADHERE TO SAFETY PRECAUTIONS W/ASSISTANCE/DEVICE STG Adhere to Safety Precautions With Assistance/Device. independent  Outcome: Progressing Goal: RH STG DECREASED RISK OF FALL WITH ASSISTANCE STG Decreased Risk of Fall With min Assistance.  Outcome: Progressing Goal: RH STG DEMO UNDERSTANDING HOME SAFETY PRECAUTIONS Pt able to verbalize understanding and demonstrate safe mobility by d/c.Min assist w/ transfers .  Outcome: Progressing Goal: RH OTHER STG SAFETY GOALS W/ASSIST Other STG Safety Goals With min Assistance.  Outcome: Progressing  Problem: RH PAIN MANAGEMENT Goal: RH STG PAIN MANAGED AT OR BELOW PT'S PAIN GOAL Pain goal is that pain level is less than 2.  Outcome: Progressing Goal: RH OTHER STG PAIN MANAGEMENT GOALS W/ASSIST Other STG Pain Management Goals With [place].  Outcome: Progressing  Problem: RH KNOWLEDGE DEFICIT Goal: RH STG INCREASE KNOWLEDGE OF DIABETES Pt abel to verbalize understanding of diabetes management and pt and or caregiver able to check sugars and administer meds via teach-back.  Outcome: Progressing Goal: RH STG INCREASE KNOWLEDGE OF HYPERTENSION Pt able to verbalize understanding of hypertension and management w. Diet and meds via teach-back method.  Outcome: Progressing

## 2014-07-13 NOTE — Progress Notes (Signed)
Occupational Therapy Session Note  Patient Details  Name: Frank [name] MRN: [id number] Date of Birth: 05/22/1956  Today's Date: 07/13/2014 OT Individual Time: 1030-1130 OT Calculated Individual Time (min): 60 min   Short Term Goals: Week 1:  OT Short Term Goal 1 (Week 1): Pt will complete LB bathing with mod A.  OT Short Term Goal 2 (Week 1): Pt will complete LB dressing with mod A. OT Short Term Goal 3 (Week 1): Pt will complete toilet transfer with mod A.  OT Short Term Goal 4 (Week 1): Pt will complete shower transfer with mod A. OT Short Term Goal 5 (Week 1): Pt will be educated on use of AE for increasing independence in self-feeding.  Skilled Therapeutic Interventions/Progress Updates:  Skilled OT session focusing on ADL retraining, sit<>stand transfers, functional transfers, bed mobility, and activity tolerance/endurance. Pt supine in bed when arriving, reporting no pain but stating he was fatigued. Pt transferred to EOB, doffed shirt, therapist covered line in neck, and pt transferred to TTB using STEDY. Pt stood using STEDY to pull down pants. Pt completed UB/LB bathing, performing lateral leans, and taking more than reasonable amount of time. Pt requiring steady assist while seated on TTB and mod v/c for safety awareness and leg placement while bathing. Pt transferred to EOB using STEDY and completed UB/LB dressing, standing to pull up pants with STEDY. Pt brushed teeth at sink while seated on STEDY, with steadying assist and assistance opening toothpaste. Pt transferred to w/c with STEDY and seated in w/c with SLP arriving when leaving. Pt needing less assistance this session with sit<>stand than during previous session.  Therapy Documentation Precautions:  Precautions Precautions: Fall Precaution Comments: h/o multiple falls Restrictions Weight Bearing Restrictions: No  See FIM for current functional status  Therapy/Group: Individual Therapy  Tyrick Dunagan  [place] 07/13/2014, 7:21 AM

## 2014-07-13 NOTE — Progress Notes (Signed)
Social Work Assessment and Plan  Patient Details  Name: Frank [name] MRN: [id number] Date of Birth: 05-02-1956  Today's Date: 07/13/2014  Problem List:  Patient Active Problem List   Diagnosis Date Noted  . C. difficile colitis 07/11/2014  . Guillain Barr syndrome 07/11/2014  . Acute respiratory failure   . Encounter for orogastric (OG) tube placement   . Acute respiratory acidosis   . History of ETT   . Pain   . Rapidly progressive weakness 06/30/2014  . Acute respiratory failure with hypoxia 06/30/2014  . Protein-calorie malnutrition, severe 06/30/2014  . Ascending paralysis   . Nausea with vomiting   . Back pain, thoracic 06/21/2014  . Nausea and vomiting 06/21/2014  . General weakness 06/21/2014  . Gait disorder 06/21/2014  . Decreased frequency of bowel movements 06/21/2014  . Orthostasis 06/21/2014  . Chronic back pain greater than 3 months duration 06/21/2014  . Dehydration 06/21/2014  . Generalized weakness 06/21/2014  . Skin lesion 05/17/2014  . Low back pain 05/17/2014  . Screen for STD (sexually transmitted disease) 05/17/2014  . Closed low lateral malleolus fracture 03/10/2014  . Acute gouty arthritis 01/12/2014  . Change in bowel habits 08/15/2013  . Hemorrhage of rectum and anus 08/15/2013  . Umbilical hernia 13/24/4010  . Rash 11/25/2010  . Foot pain 11/25/2010  . Preventative health care 11/22/2010  . Uncontrolled type II diabetes mellitus with polyneuropathy 05/20/2010  . MORTON'S NEUROMA 05/20/2010  . PERIPHERAL NEUROPATHY 05/20/2010  . ERECTILE DYSFUNCTION, ORGANIC 05/20/2010  . PERIPHERAL EDEMA 05/20/2010  . HYPERLIPIDEMIA 10/09/2008  . ADD 10/09/2008  . Essential hypertension 10/09/2008  . ALLERGIC RHINITIS 10/09/2008  . Stricture and stenosis of esophagus 02/02/2008  . COLONIC POLYPS 02/01/2008  . ANXIETY DEPRESSION 02/01/2008  . SLEEP APNEA, OBSTRUCTIVE 02/01/2008  . HEMORRHOIDS 02/01/2008  . GERD 02/01/2008  . HIATAL HERNIA 02/01/2008    Past Medical History:  Past Medical History  Diagnosis Date  . COLONIC POLYPS 02/01/2008  . DIABETES MELLITUS, TYPE II 05/20/2010  . HYPERLIPIDEMIA 10/09/2008  . ANXIETY DEPRESSION 02/01/2008  . ERECTILE DYSFUNCTION 10/09/2008  . ADD 10/09/2008  . SLEEP APNEA, OBSTRUCTIVE 02/01/2008  . MORTON'S NEUROMA 05/20/2010  . PERIPHERAL NEUROPATHY 05/20/2010  . Other specified forms of hearing loss 06/27/2009  . HYPERTENSION 10/09/2008  . HEMORRHOIDS 02/01/2008  . ALLERGIC RHINITIS 10/09/2008  . Stricture and stenosis of esophagus 02/02/2008  . GERD 02/01/2008  . HIATAL HERNIA 02/01/2008  . ERECTILE DYSFUNCTION, ORGANIC 05/20/2010  . WRIST PAIN, LEFT 12/05/2009  . FOOT PAIN, LEFT 05/20/2010  . PERIPHERAL EDEMA 05/20/2010  . DYSPNEA 03/12/2010  . Abdominal pain, unspecified site 01/19/2009  . Abdominal pain, left lower quadrant 06/06/2010  . Type II or unspecified type diabetes mellitus without mention of complication, uncontrolled 11/14/2010   Past Surgical History:  Past Surgical History  Procedure Laterality Date  . Carpal tunnel release    . Rotator cuff repair    . Esophageal dilation  july 2009  . Esophagogastroduodenoscopy N/A 06/27/2014    Procedure: ESOPHAGOGASTRODUODENOSCOPY (EGD);  Surgeon: Lafayette Dragon, MD;  Location: [facility];  Service: Endoscopy;  Laterality: N/A;  . Eye surgery      catract surgery on both eyes   Social History:  reports that he has never smoked. He has never used smokeless tobacco. He reports that he drinks alcohol. He reports that he uses illicit drugs (Methaqualone).  Family / Support Systems Marital Status: Single Patient Roles: Partner, Other (Comment) (Brother) Spouse/Significant Other: Danne Baxter - partner -  [phone] (h)/ [phone] (w)/ [phone] (m)  They have been together for 25 years. Children: None Other Supports: Frankie Sharrow - brother - [phone]   and 3 other brothers Anticipated Caregiver: Mike can assist when he is not at work. Ability/Limitations  of Caregiver: Mike works FT at [employer] from 10 am on. Caregiver Availability: Evenings only Family Dynamics: Pt reports he and his brothers are close.  Pt and Mike have been together for 25 years and he is supportive.  Social History Preferred language: English Religion: None Education: Graduated from high school.  Has taken college courses as a hobby, not towards a degree. Read: Yes Write: Yes Employment Status: Unemployed Date Retired/Disabled/Unemployed: July 2015 due to broken ankle and peripheral neuropathy.   Legal History/Current Legal Issues: None reported Guardian/Conservator: N/A   Abuse/Neglect Physical Abuse: Denies Verbal Abuse: Denies Sexual Abuse: Denies Exploitation of patient/patient's resources: Denies Self-Neglect: Denies  Emotional Status Pt's affect, behavior and adjustment status: Pt was open and talkative with CSW.  He reports his mood is stable currently, with anxiety from time to time.  Pt could not describe any coping strategies besides medication he uses for anxiety. 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 Psychiatric History: Anxiety Substance Abuse History: None reported  Patient / Family Perceptions, Expectations & Goals Pt/Family understanding of illness & functional limitations: Pt reports he has received information from the doctors about his condition. Premorbid pt/family roles/activities: Pt enjoys watching TV, gointg to the beach. Anticipated changes in roles/activities/participation: Pt's partner has been taking care of pt for a while and taking care of house.  Pt helps as he is able. Pt/family expectations/goals: Pt stated "I just want to get back to my original self."  Community Resources Community Agencies: None Premorbid Home Care/DME Agencies: Other (Comment) (Pt was at [facility] for 2 days prior to  returning to hospital.  Mike has used [facility] in the past.) Transportation available at discharge: Mike or brothers Resource referrals recommended: Neuropsychology  Discharge Planning Living Arrangements: Spouse/significant other Support Systems: Spouse/significant other, Other relatives, Friends/neighbors Type of Residence: Private residence Insurance Resources: Private Insurance (specify) (Blue Cross Blue Shield) Financial Resources: Family Support Financial Screen Referred: No Living Expenses: Mortgage Money Management: Significant Other Does the patient have any problems obtaining your medications?: No (Pt told a nurse that he has problems obtaining his medications, but did not share this with CSW.) Home Management: Currently partner is taking care of all home management. Patient/Family Preliminary Plans: Pt believes he will be well enough to return home by himself with Mike coming home at lunch and in the evenings.  Goals are set for supervision and pt does not have 24/7 supervision set at this time.  Pt does have a male friend who may be available intermittently to help pt when he calls. Barriers to Discharge: Steps (Pt can reach both railings.) Expected length of stay: 17 - 21 days  Clinical Impression CSW met with pt to introduce self and role of CSW, as well as to complete assessment.  Pt was talkative with CSW and is hopeful for recovery, but realizes he has been struggling for a while, so it may take some time to get stronger.  Pt is not completely realistic of the care he will need when he goes home, thinking he will not need a caregiver 24/7.  CSW began discussing this with pt and   therapists will also talk with pt.  CSW to f/u with Mike, pt's partner, to go over this with him, as well.  Pt admits to having anxiety and per his report, only uses medication to assist with this and does not have any other coping strategies.  Pt is open to meeting with the neuropsychologist for this  purpose.  Pt also reports that he is still grieving the death of his mother which occurred 15 years ago.  CSW was supportive and told him that grief can be complicated and there is no time line for it, but that 15 years was a long time to be "stuck" with the same grief feelings and that it may be helpful for pt to receive support/counseling for this.  Pt did receive counseling and utilized resources at hospice, but he does not feel things have improved significantly.  CSW will refer pt to neuropsychologist for these reasons in addition to support from this CSW.  Pt asked to see a dietician regarding healthier eating on home on his restricted diet.  CSW spoke with Pamela Love, PA about this and she will order dietician consult for pt.  Pt was also wondering about how long he will be on c-diff medication and PA discussed this with him.  CSW will talk with Mike and continue to follow pt and assist with d/c plan.  ,  Capps 07/13/2014, 11:02 AM    

## 2014-07-13 NOTE — Progress Notes (Signed)
[facility] Individual Statement of Services  Patient Name:  Frank [name]  Date:  07/13/2014  Welcome to the [facility].  Our goal is to provide you with an individualized program based on your diagnosis and situation, designed to meet your specific needs.  With this comprehensive rehabilitation program, you will be expected to participate in at least 3 hours of rehabilitation therapies Monday-Friday, with modified therapy programming on the weekends.  Your rehabilitation program will include the following services:  Physical Therapy (PT), Occupational Therapy (OT), Speech Therapy (ST), 24 hour per day rehabilitation nursing, Neuropsychology, Case Management (Social Worker), Rehabilitation Medicine, Nutrition Services and Pharmacy Services  Weekly team conferences will be held on Tuesdays to discuss your progress.  Your Social Worker will talk with you frequently to get your input and to update you on team discussions.  Team conferences with you and your family in attendance may also be held.  Expected length of stay:  17-21 days  Overall anticipated outcome:  Supervision with some minimal assistance needed  Depending on your progress and recovery, your program may change. Your Social Worker will coordinate services and will keep you informed of any changes. Your Social Worker's name and contact numbers are listed  below.  The following services may also be recommended but are not provided by the [facility] will be made to provide these services after discharge if needed.  Arrangements include referral to agencies that provide these services.  Your insurance has been verified to be:  [employer] Your primary doctor is:  Dr. Cathlean Cower  Pertinent information will be shared with  your doctor and your insurance company.  Social Worker:  Alfonse Alpers, LCSW  [phone] or ([phone]  Information discussed with and copy given to patient by: Trey Sailors, 07/13/2014, 10:02 AM

## 2014-07-13 NOTE — Progress Notes (Signed)
Physical Therapy Session Note  Patient Details  Name: Frank [name] MRN: [id number] Date of Birth: Sep 22, 1955  Today's Date: 07/13/2014 PT Individual Time: 0800-0900 PT Individual Time Calculation (min): 60 min   Short Term Goals: Week 1:  PT Short Term Goal 1 (Week 1): Pt will move supine<>sit w/ SBA consistently PT Short Term Goal 2 (Week 1): Pt will propel w/c 100' w/ SBA consistently PT Short Term Goal 3 (Week 1): Pt will demonstrate appropriate pressure relief techniques with min cueing PT Short Term Goal 4 (Week 1): Pt will direct setup for bed<>w/c transfer w/ min cueing   Skilled Therapeutic Interventions/Progress Updates:   Pt presents in supine and agreeable to therapy. Pt thredded pants independently at bed level and required A from therapist in standing with Stedy to pull up pants in standing. Pt able to pull up into standing with Stedy with steady A and used this to transfer to w/c. Pt donned shoes seated in w/c but required total A to tie shoes due to impaired fine motor skills. Focused on neuro re-ed in standing and progressing to gait training using Maxi Sky with +2 assist for standing and gait and +3 for close w/c follow during gait. Pt requires verbal and manual cues for postural control, upright posture, knee extension and hip extension due to impairments in sensation, proprioception, postural control, and balance. Pt able to gait x 12' before fatigued; significant ataxia with gait in BLE's as well as trunk. Pt able initiate stairs in [facility] with +2 assist for upright posture and balance x 2 steps and repeated after seated rest break again. Pt more confident with UE support. Recommend in future gait trials, to use Harmon Pier walker for UE support as well as mirror for visual feedback due to proprioceptive deficits.  Pt asking about exercises he can do in his room for LE strengthening and demonstrated to pt heel slides, hip abduction, ankle pumps and bridging in supine as well as seated  LAQ and marching. Pt will benefit from formal HEP in future session but unable to have pt practice all of these due to time restraints (performed seated LAQ and marching while therapist applied Theraband to w/c rims for increased grip to aid with mobility). Pt able to propel w/c back to room with improved ability (S/min A and cues for efficiency of propulsion) with theraband (required up to mod A without). Safety belt on and call bell in reach.   Therapy Documentation Precautions:  Precautions Precautions: Fall Precaution Comments: h/o multiple falls Restrictions Weight Bearing Restrictions: No   Pain: Pt premedicated for back and shoulder pain - "soreness".  See FIM for current functional status  Therapy/Group: Individual Therapy  [name] [facility] 07/13/2014, 11:24 AM

## 2014-07-14 ENCOUNTER — Inpatient Hospital Stay (HOSPITAL_COMMUNITY): Payer: BC Managed Care – PPO | Admitting: Occupational Therapy

## 2014-07-14 ENCOUNTER — Encounter (HOSPITAL_COMMUNITY): Payer: BC Managed Care – PPO | Admitting: Occupational Therapy

## 2014-07-14 ENCOUNTER — Inpatient Hospital Stay (HOSPITAL_COMMUNITY): Payer: BC Managed Care – PPO

## 2014-07-14 LAB — GLUCOSE, CAPILLARY
GLUCOSE-CAPILLARY: 81 mg/dL (ref 70–99)
GLUCOSE-CAPILLARY: 95 mg/dL (ref 70–99)
GLUCOSE-CAPILLARY: 137 mg/dL — AB (ref 70–99)
GLUCOSE-CAPILLARY: 102 mg/dL — AB (ref 70–99)
Glucose-Capillary: 138 mg/dL — ABNORMAL HIGH (ref 70–99)
Glucose-Capillary: 92 mg/dL (ref 70–99)

## 2014-07-14 LAB — URINALYSIS, ROUTINE W REFLEX MICROSCOPIC
Bilirubin Urine: NEGATIVE
Glucose, UA: NEGATIVE mg/dL
Hgb urine dipstick: NEGATIVE
KETONES UR: NEGATIVE mg/dL
NITRITE: POSITIVE — AB
PH: 7.5 (ref 5.0–8.0)
Protein, ur: NEGATIVE mg/dL
Specific Gravity, Urine: 1.008 (ref 1.005–1.030)
UROBILINOGEN UA: 0.2 mg/dL (ref 0.0–1.0)

## 2014-07-14 LAB — URINE MICROSCOPIC-ADD ON

## 2014-07-14 MED ORDER — SODIUM CHLORIDE 0.9 % IV BOLUS (SEPSIS)
500.0000 mL | Freq: Once | INTRAVENOUS | Status: AC
  Administered 2014-07-14: 500 mL via INTRAVENOUS

## 2014-07-14 NOTE — Plan of Care (Signed)
Problem: RH SAFETY Goal: RH STG ADHERE TO SAFETY PRECAUTIONS W/ASSISTANCE/DEVICE STG Adhere to Safety Precautions With Assistance/Device. independent  Outcome: Not Progressing Pt was assisted to floor at 0230 on Dec 4. He was found sitting at edge of bed leaning on sink and states he is trying to get to bathroom. Pt req bed alarm engaged whenever in  Bed and freq verb cues reminders at least twice each shift to use call light and not attempt to get up without staff present in room. Be sure call light within his reach with each encounter with pt before leaving room.  Goal: RH STG DECREASED RISK OF FALL WITH ASSISTANCE STG Decreased Risk of Fall With min Assistance.  Outcome: Not Progressing Pt req staff to continually remind him not to get out of bed without staff present in room. All transfers need to utilize stedy due to ataxic movements.  Goal: RH OTHER STG SAFETY GOALS W/ASSIST Other STG Safety Goals With min Assistance.  Outcome: Not Progressing After lowered to floor skin integrity intact on dec 4 at 0245. Pt is high fall risk req use of bed alarm whenever in bed.  Problem: RH PAIN MANAGEMENT Goal: RH STG PAIN MANAGED AT OR BELOW PT'S PAIN GOAL Pain goal is that pain level is less than 2.  Outcome: Progressing

## 2014-07-14 NOTE — Progress Notes (Addendum)
Heard pt calling out for help. NT entered room to observe pt sitting at edge of bed, leaning forward onto sink; pt was assisted to floor by staff as his position prevented him from getting back into bed safely. Staff was notified and pt was assisted back into the bed. Pt is very anxious/distraught and reassured he is back safely in bed. When asked pt states was trying to get to bathroom. Reminded of importance to use call light and wait for assist. He states he did use call light; it was not on neither was bed alarm engaged. Bed linen was soiled with urine, large amt. Vs obtained at 0242 for 109/69  97  28 and sat 100% on 2 liters O2 per [place]. Pt denies pain to head,back and legs. Pt req freq reassurance and verb cues to calm and relax as he was panting. Pt repeated stating he needs to get up and void. Attempts to use urinal unsuccessful. Pt bladder scanned for 712cc's. Pt was assisted with mechanical lift to br, sat on toilet and after extra time only able to void scant amts. Assisted back to bed, bladder scanned again at 0250 for >999cc's. Pt IC by staff for 900cc's clear pale yellow urine. Is c/o's now of right shoulder discomfort, skin intact no reddness noted. Pt asking for ice pack and was done. Dan A paged at [phone] and made aware of above. No new orders. Pt insist that partner not be called regarding above until after 0600 today. cbg checked at 0345 for 81. 0700 call made to Legrand Como and informed of above. Legrand Como states pt also fell at [facility]. Currently has no additional ideas for action safety plan. Safety portal not completed as hosp wide computer system is not working. Charge RN  Carmell Austria aware and involved in above.

## 2014-07-14 NOTE — Progress Notes (Signed)
1300: OT called nurse into room due to patients blood pressure. Patient complained of feeling weird and light headed. Nursing rechecked blood pressure with the result of 87/51. Notified  Pam Love PA of findings. Love aware of patients assisted fall at 2:45am. New orders for normal saline bolus of 500 over 2 hours with continuous checks of blood pressure. Bolus given with improvement of B/P and improvement of symptoms. Patient states he "feels much better".   Patient temp at 1800 was 100.1. 2 Tylenol given for fever. Will report to night RN with findings and with recheck.

## 2014-07-14 NOTE — Progress Notes (Signed)
[facility] PHYSICAL MEDICINE & REHABILITATION     PROGRESS NOTE    Subjective/Complaints: Complains of urinary frequency and pain in neck from IJ. A  review of systems has been performed and if not noted above is otherwise negative.   Objective: Vital Signs: Blood pressure 98/48, pulse 78, temperature 98.2 F (36.8 C), temperature source Oral, resp. rate 18, weight 83.4 kg (183 lb 13.8 oz), SpO2 97 %. No results found.  Recent Labs  07/12/14 0455  WBC 5.8  HGB 11.3*  HCT 32.9*  PLT 316    Recent Labs  07/12/14 0455  NA 143  K 4.2  CL 104  GLUCOSE 101*  BUN 16  CREATININE 0.88  CALCIUM 9.5   CBG (last 3)   Recent Labs  07/13/14 2131 07/14/14 0343 07/14/14 0654  GLUCAP 88 81 95    Wt Readings from Last 3 Encounters:  07/13/14 83.4 kg (183 lb 13.8 oz)  07/10/14 81.693 kg (180 lb 1.6 oz)  07/02/14 85.276 kg (188 lb)    Physical Exam:  Constitutional: He is oriented to person, place, and time. He appears well-developed. No distress.  HENT:  Head: Normocephalic and atraumatic.  Eyes: Conjunctivae and EOM are normal. Pupils are equal, round, and reactive to light.  Neck: No JVD present. No tracheal deviation present. No thyromegaly present.  Cardiovascular: Normal rate and regular rhythm.  Respiratory: No respiratory distress. He has no wheezes. He has no rales. He exhibits no tenderness.  GI: He exhibits no distension. There is no tenderness. There is no rebound and no guarding.  Musculoskeletal: He exhibits no edema or tenderness.  Lymphadenopathy:   He has no cervical adenopathy.  Neurological: He is alert and oriented to person, place, and time.  Diffuse sensory loss distal greater than proximal in stocking glove distribution, inconsistent in all 4 limbs, lower more affected than uppers. Motor: 4/5 deltoid, bicep, tricep, hands. LE: 3/5 hf, 3+ ke and 4/5 ankles--not consistent with effort, tended to be anxious. DTR's 1+ to absent throughout  Skin:  Skin is warm. He is not diaphoretic. IJ site irritated  Psychiatric:  Flat, slow to arouse  Assessment/Plan: 1. 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 Physiatrist is providing close team supervision and 24 hour management of active medical problems listed below. Physiatrist and rehab team continue to assess barriers to discharge/monitor patient progress toward functional and medical goals. FIM: FIM - Bathing Bathing Steps Patient Completed: Chest, Right Arm, Left Arm, Abdomen, Front perineal area, Buttocks, Right upper leg, Left upper leg, Right lower leg (including foot), Left lower leg (including foot) Bathing: 4: Steadying assist  FIM - Upper Body Dressing/Undressing Upper body dressing/undressing steps patient completed: Thread/unthread right sleeve of pullover shirt/dresss, Thread/unthread left sleeve of pullover shirt/dress, Put head through opening of pull over shirt/dress, Pull shirt over trunk Upper body dressing/undressing: 4: Steadying assist FIM - Lower Body Dressing/Undressing Lower body dressing/undressing steps patient completed: Thread/unthread right pants leg, Thread/unthread left pants leg Lower body dressing/undressing: 2: Max-Patient completed 25-49% of tasks  FIM - Toileting Toileting steps completed by patient: Performs perineal hygiene Toileting Assistive Devices: Grab bar or rail for support Toileting: 0: Activity did not occur  FIM - Radio producer Devices: Bedside commode, Grab bars (drop arm) Toilet Transfers: 0-Activity did not occur  FIM - Control and instrumentation engineer Devices: HOB elevated, Bed rails Bed/Chair Transfer: 5: Supine > Sit:  Supervision (verbal cues/safety issues), 1: Mechanical lift  FIM - Locomotion: Wheelchair Distance: 100 Locomotion: Wheelchair: 1: Travels less than 50 ft  with moderate assistance (Pt: 50 - 74%) FIM - Locomotion: Ambulation Locomotion: Ambulation Assistive Devices: MaxiSky Ambulation/Gait Assistance: 1: +2 Total assist (+3 for w/c follow) Locomotion: Ambulation: 1: Two helpers  Comprehension Comprehension Mode: Auditory Comprehension: 6-Follows complex conversation/direction: With extra time/assistive device  Expression Expression Mode: Verbal Expression: 6-Expresses complex ideas: With extra time/assistive device  Social Interaction Social Interaction: 6-Interacts appropriately with others with medication or extra time (anti-anxiety, antidepressant).  Problem Solving Problem Solving: 5-Solves complex 90% of the time/cues < 10% of the time  Memory Memory: 6-More than reasonable amt of time  Medical Problem List and Plan: 1. Functional deficits secondary to Guillain Barre Syndrome (vs CIDP) in patient with chronic peipheral neuropathy 2. DVT Prophylaxis/Anticoagulation: Pharmaceutical: Lovenox 3. Chronic Pain Management: Was on nucynta 100 mg tid PTA.  -substitute oxycodone for nucynta (which isn't on formulary) -lyrica and cymbalta 4. Anxiety/depression/Mood: Continue Cymbalta for mood stabilization. Will continue ativan po prn for anxiety. LCSW to follow for evaluation and support.   -resumed concerta (lower than his presumed home dose for now) 5. Neuropsych: This patient is capable of making decisions on his own behalf. 6. Skin/Wound Care: Routine pressure relief measures 7. Fluids/Electrolytes/Nutrition: Monitor I/O. BMET within normal limits today 8. HTN: Blood pressure controlled off medications.  9. Peripheral neuropathy: Continue lyrica bid with Cymbalta.  10. DM type 2: Continue to check BS ac/hs basis with SSI for elevated BS. May need glucotrol resumed as po intake improves.   -for now sugars are controlled 11. Anemia: hgb up to 11.3  10 OSA: no new issues 11. GERD s/p recent  dilatation of esophageal stricture:  12. C diff colitis: Flagyl through 12/14 --no more loose stools   13. Urinary frequency: check urine/culture today  LOS (Days) 3 A FACE TO FACE EVALUATION WAS PERFORMED  Macall Mccroskey T 07/14/2014 8:57 AM

## 2014-07-14 NOTE — IPOC Note (Addendum)
Overall Plan of Care [facility]) Patient Details Name: Frank [name] MRN: [id number] DOB: [birth year]  Admitting Diagnosis: GBS  Hospital Problems: Principal Problem:   Guillain Barr syndrome Active Problems:   Uncontrolled type II diabetes mellitus with polyneuropathy   C. difficile colitis     Functional Problem List: Nursing Medication Management, Safety, Sensory, Pain, Motor  PT Balance, Behavior, Safety, Perception, Sensory, Endurance, Motor  OT Balance, Cognition, Endurance, Motor, Safety, Pain, Sensory, Skin Integrity  SLP    TR Activity tolerance, functional mobility, balance, safety, pain, anxiety/stress        Basic ADL's: OT Bathing, Toileting, Dressing, Grooming, Eating     Advanced  ADL's: OT Simple Meal Preparation     Transfers: PT Bed Mobility, Bed to Chair, Car, Furniture, Floor  OT Toilet, Metallurgist: PT Ambulation, Emergency planning/management officer, Stairs     Additional Impairments: OT    SLP Swallowing      TR      Anticipated Outcomes Item Anticipated Outcome  Self Feeding S- Mod I  Swallowing  Mod I with least restrictive diet    Basic self-care  Min A- S  Toileting  Min A- S   Bathroom Transfers Min A- S  Bowel/Bladder     Transfers  supervision  Locomotion  mod (I) from w/c level, MinA for short distance ambulation  Communication     Cognition     Pain  pain gaol less than 2. Pain relief  Safety/Judgment  Demonstrates safe mobility and follows safety plan,d/c to safe environment.   Therapy Plan: PT Intensity: Minimum of 1-2 x/day ,45 to 90 minutes PT Frequency: 5 out of 7 days PT Duration Estimated Length of Stay: 17-21 days OT Intensity: Minimum of 1-2 x/day, 45 to 90 minutes OT Frequency: 5 out of 7 days OT Duration/Estimated Length of Stay: 2 1/2 weeks- 3 weeks SLP Intensity: Minumum of 1-2 x/day, 30 to 90 minutes SLP Frequency: 5 out of 7 days SLP Duration/Estimated Length of Stay: 2-3 weeks   TR Duration/ELOS:  3  weeks TR Frequency:  Min 1 time per week >20 minutes        Team Interventions: Nursing Interventions Patient/Family Education, Medication Management, Disease Management/Prevention, Pain Management, Discharge Planning, Psychosocial Support  PT interventions Ambulation/gait training, Training and development officer, Cognitive remediation/compensation, Discharge planning, Disease management/prevention, Neuromuscular re-education, Functional mobility training, Patient/family education, Therapeutic Activities, Therapeutic Exercise, UE/LE Strength taining/ROM, UE/LE Coordination activities, Wheelchair propulsion/positioning, Psychosocial support  OT Interventions Training and development officer, Discharge planning, Community reintegration, DME/adaptive equipment instruction, Functional mobility training, Psychosocial support, Pain management, Self Care/advanced ADL retraining, Therapeutic Activities, Therapeutic Exercise, UE/LE Coordination activities, UE/LE Strength taining/ROM, Wheelchair propulsion/positioning  SLP Interventions Dysphagia/aspiration precaution training, Patient/family education  TR Interventions Recreation/leisure participation, Balance/Vestibular training, functional mobility, therapeutic activities, UE/LE strength/coordination, w/c mobility, community reintegration, pt/family education, adaptive equipment instruction/use, discharge planning, psychosocial support  SW/CM Interventions Discharge Planning, Barrister's clerk, Patient/Family Education    Team Discharge Planning: Destination: PT-Home ,OT- Home , SLP-Home Projected Follow-up: PT-Home health PT, OT-  Home health OT, SLP-None Projected Equipment Needs: PT-Wheelchair (measurements), Wheelchair cushion (measurements), OT- To be determined, SLP-None recommended by SLP Equipment Details: PT-18x16, OT-  Patient/family involved in discharge planning: PT- Patient,  OT-Patient, SLP-Patient  MD ELOS: 17-21 days Medical Rehab  Prognosis:  Excellent Assessment: The patient has been admitted for CIR therapies with the diagnosis of Guillain Barre Syndrome. The team will be addressing functional mobility, strength, stamina, balance, safety, adaptive techniques and equipment, self-care, bowel and bladder mgt,  patient and caregiver education, NMR, pain mgt, ego support, ?orthotics, activity tolerance. Goals have been set at min assist for self-care and ADL's, transfers, and mod I with w/c locomotion.    Meredith Staggers, MD, FAAPMR      See Team Conference Notes for weekly updates to the plan of care

## 2014-07-14 NOTE — Progress Notes (Signed)
Occupational Therapy Session Note  Patient Details  Name: Frank [name] MRN: [id number] Date of Birth: February 25, 1956  Today's Date: 07/14/2014   Short Term Goals: Week 1:  OT Short Term Goal 1 (Week 1): Pt will complete LB bathing with mod A.  OT Short Term Goal 2 (Week 1): Pt will complete LB dressing with mod A. OT Short Term Goal 3 (Week 1): Pt will complete toilet transfer with mod A.  OT Short Term Goal 4 (Week 1): Pt will complete shower transfer with mod A. OT Short Term Goal 5 (Week 1): Pt will be educated on use of AE for increasing independence in self-feeding.  Skilled Therapeutic Interventions/Progress Updates:   Session 1: OT Individual Time: 0930-1050 OT Calculated Individual Time (min): 80 min Skilled OT session focusing on ADL retraining, sit<>stand transfers, set-direction of care, bed mobility, and activity tolerance/endurance. Pt supine in bed with HOB elevated when arriving, reporting slight pain in his bottom from his fall last night. Provided pt with w/c gloves for increased independence with w/c propulsion (due to decreased sensation in BUE), bath mit for bathing, and foam handle for tooth brush for increased grip. Pt transferred to EOB, requested to use bathroom, and transferred to drop arm commode over toilet using STEDY. Pt unable to have successful BM or void, but performed hygiene. Pt transferred to TTB with STEDY and completed UB/LB bathing with steady assist. Pt with less ataxia during shower this session. Pt reported being told that he couldn't leave the room, educated pt on being able to leave the room if he washed hands. Pt transferred to EOB with STEDY and completed UB/LB dressing. Noted redness on pt bottom and RN made aware. RN arrived and applied dressing. Pt reported feeling "funny" and BP at 84/60, pulse at 121 bpm, and O2 sat at 98% on room air. Pt transferred to supine and BP at 111/60, pulse at 99 bpm, and O2 sat at 96 % on room air. Pt seated in bed with HOB  elevated and all needs nearby when leaving.   Session 2:  Pt missed 40 min skilled OT secondary to low BP (refer to flowsheets) and subsequent nursing needs. RN aware and attending to pt. Rehab scheduling team notified.   Therapy Documentation Precautions:  Precautions Precautions: Fall Precaution Comments: h/o multiple falls Restrictions Weight Bearing Restrictions: No  See FIM for current functional status  Therapy/Group: Individual Therapy  Frank [name] 07/14/2014, 7:19 AM

## 2014-07-14 NOTE — Progress Notes (Signed)
Physical Therapy Session Note  Patient Details  Name: Frank [name] MRN: [id number] Date of Birth: 1956-03-18  Today's Date: 07/14/2014 PT Individual Time: 0800-0900 PT Individual Time Calculation (min): 60 min   Short Term Goals: Week 1:  PT Short Term Goal 1 (Week 1): Pt will move supine<>sit w/ SBA consistently PT Short Term Goal 2 (Week 1): Pt will propel w/c 100' w/ SBA consistently PT Short Term Goal 3 (Week 1): Pt will demonstrate appropriate pressure relief techniques with min cueing PT Short Term Goal 4 (Week 1): Pt will direct setup for bed<>w/c transfer w/ min cueing   Skilled Therapeutic Interventions/Progress Updates:    Pt very anxious during session and requires frequent encouragement, redirection and rest breaks to calm down - RN already aware. Pt repeating incident with fall yesterday and also repeating complaints of needing to urinate and burning sensation. Focused on functional bed mobility for supine <-> sit as well as rolling and bridging to don brief after performing multiple sit to stands and transfers using Stedy lift (min A for sit to stand pulling up with BUE) in and out of bed as well as in and out of toilet. Pt declined putting on clothing until after his bath with OT. Pt able to urinate but did not empty fully as RN scanned bladder and required to stay in bed for cathing after PT session. Therefore, focused on supine therex for functional strengthening including heel slides, hip abduction, and bridging x 15 reps each. Positioned in supine with all needs in place and bed alarm on.   Therapy Documentation Precautions:  Precautions Precautions: Fall Precaution Comments: h/o multiple falls Restrictions Weight Bearing Restrictions: No   Pain: c/o headache - received Tylenol. Also c/o burning during urination - RN made aware.  See FIM for current functional status  Therapy/Group: Individual Therapy  [name] [facility] 07/14/2014, 9:32 AM

## 2014-07-15 ENCOUNTER — Inpatient Hospital Stay (HOSPITAL_COMMUNITY): Payer: BC Managed Care – PPO | Admitting: Occupational Therapy

## 2014-07-15 ENCOUNTER — Encounter (HOSPITAL_COMMUNITY): Payer: BC Managed Care – PPO | Admitting: Occupational Therapy

## 2014-07-15 ENCOUNTER — Inpatient Hospital Stay (HOSPITAL_COMMUNITY): Payer: BC Managed Care – PPO | Admitting: Physical Therapy

## 2014-07-15 LAB — GLUCOSE, CAPILLARY
GLUCOSE-CAPILLARY: 96 mg/dL (ref 70–99)
GLUCOSE-CAPILLARY: 126 mg/dL — AB (ref 70–99)
Glucose-Capillary: 112 mg/dL — ABNORMAL HIGH (ref 70–99)
Glucose-Capillary: 128 mg/dL — ABNORMAL HIGH (ref 70–99)

## 2014-07-15 MED ORDER — BOOST / RESOURCE BREEZE PO LIQD
1.0000 | Freq: Every day | ORAL | Status: DC
  Administered 2014-07-16 – 2014-07-28 (×11): 1 via ORAL

## 2014-07-15 NOTE — Progress Notes (Signed)
INITIAL NUTRITION ASSESSMENT  DOCUMENTATION CODES Per approved criteria  -Severe malnutrition in the context of acute illness or injury   INTERVENTION: Resource Breeze po daily, each supplement provides 250 kcal and 9 grams of protein  Continue to reinforce DM diet education during admission.   NUTRITION DIAGNOSIS: Malnutrition related to acute illness as evidenced by 9% weight loss x 1 month and intake <50% of his needs for >/= 5 days.   Goal: Pt to meet >/= 90% of their estimated nutrition needs   Monitor:  PO intake, supplement acceptance, weight trends  Reason for Assessment: MD consult  58 y.o. male  Admitting Dx: Sharlyn Bologna syndrome  ASSESSMENT: Pt admitted to rehab after inpatient stay for GBS with IVIG treatments.  Pt now with C.Diff. Pt concerned about managing his DM diet at home after discharge. Encouraged pt to focus on protein, explained how CHO modified diet worked. Pt does seem confused about how to modify diet. Pt asking for carrot cake and lemonade. Pt seems to want mostly sweets. Reviewed menu and CHO limits for each meal.   Pt eating lunch with modified utensils.  Pt ate very poorly during admission (10-40% of meals). For the last few days pt consuming 100% of his meals.    Nutrition Focused Physical Exam:  Subcutaneous Fat:  Orbital Region: WDL Upper Arm Region: mild/moderate depletion  Thoracic and Lumbar Region: WDL  Muscle:  Temple Region: WDL Clavicle Bone Region: WDL Clavicle and Acromion Bone Region: WDL Scapular Bone Region: WDL Dorsal Hand: NA Patellar Region: mild/moderate depletion  Anterior Thigh Region: mild/moderate depletion  Posterior Calf Region: WDL   Edema: NA Height: Ht Readings from Last 1 Encounters:  07/06/14 5\' 9"  (1.753 m)    Weight: Wt Readings from Last 1 Encounters:  07/14/14 182 lb (82.555 kg)    Ideal Body Weight: 72.7 kg   % Ideal Body Weight: 113%  Wt Readings from Last 10 Encounters:   07/14/14 182 lb (82.555 kg)  07/10/14 180 lb 1.6 oz (81.693 kg)  07/02/14 188 lb (85.276 kg)  06/21/14 195 lb (88.451 kg)  06/20/14 200 lb (90.719 kg)  05/19/14 200 lb (90.719 kg)  05/17/14 200 lb (90.719 kg)  04/18/14 199 lb (90.266 kg)  03/27/14 206 lb (93.441 kg)  03/10/14 200 lb (90.719 kg)    Usual Body Weight: 200 lb   % Usual Body Weight: 91%  BMI:  Body mass index is 26.86 kg/(m^2).  Estimated Nutritional Needs: Kcal: 2100-2300 Protein: 100-130 grams Fluid: > 2.1 L/day  Skin: intact  Diet Order: Diet Carb Modified Meal Completion: 100%  EDUCATION NEEDS: -Education not appropriate at this time   Intake/Output Summary (Last 24 hours) at 07/15/14 0934 Last data filed at 07/15/14 7741  Gross per 24 hour  Intake   1340 ml  Output   2193 ml  Net   -853 ml    Last BM: 12/4 - small, soft C.diff positive    Labs:   Recent Labs Lab 07/09/14 0320 07/11/14 0458 07/12/14 0455  NA 138 137 143  K 3.8 4.0 4.2  CL 101 100 104  CO2 26 23 27   BUN 22 17 16   CREATININE 0.89 0.89 0.88  CALCIUM 9.3 9.3 9.5  GLUCOSE 112* 110* 101*    CBG (last 3)   Recent Labs  07/14/14 1637 07/14/14 2152 07/15/14 0649  GLUCAP 137* 102* 96    Scheduled Meds: . antiseptic oral rinse  7 mL Mouth Rinse BID  . chlorhexidine  15  mL Mouth/Throat BID  . DULoxetine  30 mg Oral Daily  . enoxaparin (LOVENOX) injection  40 mg Subcutaneous Q24H  . famotidine  20 mg Oral Daily  . insulin aspart  0-9 Units Subcutaneous TID WC  . methylphenidate  36 mg Oral Daily  . metroNIDAZOLE  500 mg Oral 3 times per day  . polyethylene glycol  17 g Oral Daily  . pregabalin  200 mg Oral BID  . sodium chloride  10-40 mL Intracatheter Q12H    Continuous Infusions:   Past Medical History  Diagnosis Date  . COLONIC POLYPS 02/01/2008  . DIABETES MELLITUS, TYPE II 05/20/2010  . HYPERLIPIDEMIA 10/09/2008  . ANXIETY DEPRESSION 02/01/2008  . ERECTILE DYSFUNCTION 10/09/2008  . ADD 10/09/2008  .  SLEEP APNEA, OBSTRUCTIVE 02/01/2008  . MORTON'S NEUROMA 05/20/2010  . PERIPHERAL NEUROPATHY 05/20/2010  . Other specified forms of hearing loss 06/27/2009  . HYPERTENSION 10/09/2008  . HEMORRHOIDS 02/01/2008  . ALLERGIC RHINITIS 10/09/2008  . Stricture and stenosis of esophagus 02/02/2008  . GERD 02/01/2008  . HIATAL HERNIA 02/01/2008  . ERECTILE DYSFUNCTION, ORGANIC 05/20/2010  . WRIST PAIN, LEFT 12/05/2009  . FOOT PAIN, LEFT 05/20/2010  . PERIPHERAL EDEMA 05/20/2010  . DYSPNEA 03/12/2010  . Abdominal pain, unspecified site 01/19/2009  . Abdominal pain, left lower quadrant 06/06/2010  . Type II or unspecified type diabetes mellitus without mention of complication, uncontrolled 11/14/2010    Past Surgical History  Procedure Laterality Date  . Carpal tunnel release    . Rotator cuff repair    . Esophageal dilation  july 2009  . Esophagogastroduodenoscopy N/A 06/27/2014    Procedure: ESOPHAGOGASTRODUODENOSCOPY (EGD);  Surgeon: Lafayette Dragon, MD;  Location: [facility];  Service: Endoscopy;  Laterality: N/A;  . Eye surgery      catract surgery on both eyes    Maylon Peppers RD, LDN, [place] Pager [phone] After Hours Pager

## 2014-07-15 NOTE — Progress Notes (Signed)
Frank [name] is a 57 y.o. male Aug 12, 1955 [id number]  Subjective: No new complaints. No new problems. Slept well. Feeling OK.  Objective: Vital signs in last 24 hours: Temp:  [97.5 F (36.4 C)-100.1 F (37.8 C)] 98.9 F (37.2 C) (12/05 0556) Pulse Rate:  [86-105] 96 (12/05 0556) Resp:  [18-20] 20 (12/05 0556) BP: (83-120)/(42-72) 120/72 mmHg (12/05 0556) SpO2:  [98 %-100 %] 100 % (12/05 0556) Weight:  [82.555 kg (182 lb)] 82.555 kg (182 lb) (12/04 1814) Weight change:  Last BM Date: 07/10/14  Intake/Output from previous day: 12/04 0701 - 12/05 0700 In: 1820 [P.O.:1320; I.V.:500] Out: 2593 [Urine:2593]  Physical Exam General: No apparent distress - Improving BUE motor per report Lungs: Normal effort. Lungs clear to auscultation, no crackles or wheezes. Cardiovascular: Regular rate and rhythm, no edema Neurological: No new neurological deficits Wounds: N/A     Lab Results: BMET    Component Value Date/Time   NA 143 07/12/2014 0455   K 4.2 07/12/2014 0455   CL 104 07/12/2014 0455   CO2 27 07/12/2014 0455   GLUCOSE 101* 07/12/2014 0455   BUN 16 07/12/2014 0455   CREATININE 0.88 07/12/2014 0455   CALCIUM 9.5 07/12/2014 0455   GFRNONAA >90 07/12/2014 0455   GFRAA >90 07/12/2014 0455   CBC    Component Value Date/Time   WBC 5.8 07/12/2014 0455   WBC 8.5 12/30/2013 1537   RBC 3.67* 07/12/2014 0455   RBC 4.62* 12/30/2013 1537   HGB 11.3* 07/12/2014 0455   HGB 13.8* 12/30/2013 1537   HCT 32.9* 07/12/2014 0455   HCT 43.0* 12/30/2013 1537   PLT 316 07/12/2014 0455   MCV 89.6 07/12/2014 0455   MCV 93.0 12/30/2013 1537   MCH 30.8 07/12/2014 0455   MCH 29.9 12/30/2013 1537   MCHC 34.3 07/12/2014 0455   MCHC 32.1 12/30/2013 1537   RDW 12.7 07/12/2014 0455   LYMPHSABS 2.3 07/12/2014 0455   MONOABS 0.5 07/12/2014 0455   EOSABS 0.2 07/12/2014 0455   BASOSABS 0.0 07/12/2014 0455   CBG's (last 3):   Recent Labs  07/14/14 1637 07/14/14 2152 07/15/14 0649   GLUCAP 137* 102* 96   LFT's Lab Results  Component Value Date   ALT 37 07/12/2014   AST 16 07/12/2014   ALKPHOS 41 07/12/2014   BILITOT 0.2* 07/12/2014    Studies/Results: No results found.  Medications:  I have reviewed the patient's current medications. Scheduled Medications: . antiseptic oral rinse  7 mL Mouth Rinse BID  . chlorhexidine  15 mL Mouth/Throat BID  . DULoxetine  30 mg Oral Daily  . enoxaparin (LOVENOX) injection  40 mg Subcutaneous Q24H  . famotidine  20 mg Oral Daily  . insulin aspart  0-9 Units Subcutaneous TID WC  . methylphenidate  36 mg Oral Daily  . metroNIDAZOLE  500 mg Oral 3 times per day  . polyethylene glycol  17 g Oral Daily  . pregabalin  200 mg Oral BID  . sodium chloride  10-40 mL Intracatheter Q12H   PRN Medications: acetaminophen, alum & mag hydroxide-simeth, bisacodyl, diphenhydrAMINE, guaiFENesin-dextromethorphan, LORazepam **OR** LORazepam, methocarbamol, ondansetron **OR** ondansetron (ZOFRAN) IV, oxyCODONE, sodium chloride, traZODone  Assessment/Plan: Principal Problem:   Guillain Barr syndrome Active Problems:   Uncontrolled type II diabetes mellitus with polyneuropathy   C. difficile colitis  1. Functional deficits secondary to Guillain Barre Syndrome (vs CIDP) in patient with chronic peipheral neuropathy 2. DVT Prophylaxis/Anticoagulation: Pharmaceutical: Lovenox 3. Chronic Pain Management: on nucynta 100 mg tid PTA.  -  substitute oxycodone for nucynta (which isn't on formulary) -lyrica and cymbalta 4. Anxiety/depression/Mood: Continue Cymbalta for mood stabilization. Will continue ativan po prn for anxiety. LCSW to follow for evaluation and support.  -resumed concerta (lower than his presumed home dose for now) 5. Neuropsych: This patient is capable of making decisions on his own behalf. 6. Skin/Wound Care: Routine pressure relief measures 7. Fluids/Electrolytes/Nutrition: Monitor  I/O. BMET within normal limits today 8. HTN: Blood pressure controlled off medications.  9. Peripheral neuropathy: Continue lyrica bid with Cymbalta.  10. DM type 2: Continue to check BS ac/hs basis with SSI for elevated BS. May need glucotrol resumed as po intake improves. - sugars are controlled 11. Anemia: hgb stable  10 OSA: no new issues 11. GERD s/p recent dilatation of esophageal stricture:  12. C diff colitis: Flagyl through 12/14 --no loose stools at this time 13. Urinary frequency: 12/4 urine/culture pending - no clear UTI on UA, await cx  Length of stay, days: 4  Olaoluwa Grieder A. Asa Lente, MD 07/15/2014, 10:26 AM

## 2014-07-15 NOTE — Plan of Care (Signed)
Problem: RH PAIN MANAGEMENT Goal: RH STG PAIN MANAGED AT OR BELOW PT'S PAIN GOAL Pain goal is that pain level is less than 2.  Outcome: Progressing

## 2014-07-15 NOTE — Progress Notes (Signed)
Occupational Therapy Session Notes  Patient Details  Name: Frank [name] MRN: [id number] Date of Birth: 07-16-1956  Today's Date: 07/15/2014  Short Term Goals: Week 1:  OT Short Term Goal 1 (Week 1): Pt will complete LB bathing with mod A.  OT Short Term Goal 2 (Week 1): Pt will complete LB dressing with mod A. OT Short Term Goal 3 (Week 1): Pt will complete toilet transfer with mod A.  OT Short Term Goal 4 (Week 1): Pt will complete shower transfer with mod A. OT Short Term Goal 5 (Week 1): Pt will be educated on use of AE for increasing independence in self-feeding.  Skilled Therapeutic Interventions/Progress Updates:   Session #1 1100-1200 - 60 Minutes Individual Therapy No complaints of pain Patient received seated in w/c. Patient stood with STEDY and therapist assisted patient into bathroom for shower stall transfer. Patient tried voiding in urinal, but with no luck. Patient completed UB/LB bathing in seated position (using bath mit for increased independence), taking more than a reasonable amount of time. Patient then dried, stood in [place], and therapist assisted patient > w/c for UB/LB dressing in sit<>stand position using STEDY. Patient took more than a reasonable amount of time to complete tasks and patient presents with ataxic/uncoordinated movements. Therapist introduced theraputty for BUE strengthening. At end of session, left patient seated in w/c with all needs within reach.   Session #2 [id number] - 30 Minutes Individual Therapy No complaints of pain Patient received seated in w/c. Patient washed hands and therapist prepared patient to leave room. Patient eager to go outside for some "freshair". Therapist propelled patient > 1st floor of hospital (patient activating bilateral quads during assisted w/c propulsion). Once outside, therapist engaged patient in theraputty exercises, educating patient on appropriate exercises to strengthening BUEs and increase coordination throughout  [place]. Therapist assisted patient back to room and left patient seated in w/c with all needs within reach and quick release belt donned.   Precautions:  Precautions Precautions: Fall Precaution Comments: h/o multiple falls Restrictions Weight Bearing Restrictions: No  See FIM for current functional status  Emunah Texidor 07/15/2014, 7:56 AM

## 2014-07-15 NOTE — Plan of Care (Signed)
Problem: RH SAFETY Goal: RH STG ADHERE TO SAFETY PRECAUTIONS W/ASSISTANCE/DEVICE STG Adhere to Safety Precautions With Assistance/Device. independent  Outcome: Progressing

## 2014-07-15 NOTE — Progress Notes (Signed)
Physical Therapy Session Note  Patient Details  Name: Frank [name] MRN: [id number] Date of Birth: [birth year]  Today's Date: 07/15/2014 PT Individual Time: 0900-1000 Treatment Session 2: 1300-1330 PT Individual Time Calculation (min): 60 min  Treatment Session 2: 30 min  Short Term Goals: Week 1:  PT Short Term Goal 1 (Week 1): Pt will move supine<>sit w/ SBA consistently PT Short Term Goal 2 (Week 1): Pt will propel w/c 100' w/ SBA consistently PT Short Term Goal 3 (Week 1): Pt will demonstrate appropriate pressure relief techniques with min cueing PT Short Term Goal 4 (Week 1): Pt will direct setup for bed<>w/c transfer w/ min cueing   Skilled Therapeutic Interventions/Progress Updates:  Treatment Session 1:    Therapeutic Activity: PT instructs pt in supine to rolling R in bed and R side lie to sit edge of bed req SBA and one-step cues for sequencing.  Pt req tot A for lower body dressing, using the Stedy and 1 person min A to stand, and set up while sitting in [place] to don shirt.  PT instructs pt in squat-pivot transfer bed to w/c to R req mod-max A and verbal cues for hand placement and trunk lean.   Neuromuscular Reeducation: PT instructs pt in sit to stand with RW x 3 reps req min A x 2 , progressing to min A x 1 - pt demonstrates static standing balance req min-mod A.  PT instructs pt in mini-squats x 5 reps req min-mod A for dynamic standing balance.   W/C Management: PT instructs pt in w/c propulsion x 150' req SBA and verbal encouragement for this distance. PT instructs pt in w/c parts management req visual cues for legrest management and brakes management.   Treatment Session 2: Gait Training: PT instructs pt in ambulation with RW x 15' req mod-max A on first set progressing to min-mod A for balance on second set and w/c follow for safety x 2 reps. Pt req single step cues for sequencing and safety. Pt demonstrates proprioceptive deficits and compensates using vision -  extremely ataxic gait and anxiety plays a role in limiting pt's momentum, as well. Pt also compensates for knee instability by hyperextending knees with gait.   Pt demonstrates significant ataxia & dyskinesias, which limit his balance and fine motor skills, but pt's anxiety is also a significant contributor to pt's slow progress. Pt will benefit from a consistent therapy team where trust can be built due to his anxiety. Continue per PT POC.   Therapy Documentation Precautions:  Precautions Precautions: Fall Precaution Comments: h/o multiple falls Restrictions Weight Bearing Restrictions: No Pain: Pain Assessment Pain Assessment: 0-10 Pain Score: 8  Pain Type: Acute pain Pain Location: Buttocks Pain Orientation: Mid Pain Descriptors / Indicators: Pressure Pain Onset: On-going Pain Intervention(s): Emotional support Multiple Pain Sites: No  Treatment Session 2: Pt c/o 8/10 generalized soreness and PT utilizes rest to decrease pain.   See FIM for current functional status  Therapy/Group: Individual Therapy with [facility], PT Tech as +2 during each session.   [facility] 07/15/2014, 9:28 AM

## 2014-07-16 ENCOUNTER — Inpatient Hospital Stay (HOSPITAL_COMMUNITY): Payer: BC Managed Care – PPO

## 2014-07-16 LAB — URINE CULTURE: Colony Count: >=100000

## 2014-07-16 LAB — GLUCOSE, CAPILLARY
GLUCOSE-CAPILLARY: 132 mg/dL — AB (ref 70–99)
Glucose-Capillary: 113 mg/dL — ABNORMAL HIGH (ref 70–99)
Glucose-Capillary: 104 mg/dL — ABNORMAL HIGH (ref 70–99)
Glucose-Capillary: 90 mg/dL (ref 70–99)
Glucose-Capillary: 115 mg/dL — ABNORMAL HIGH (ref 70–99)

## 2014-07-16 NOTE — Progress Notes (Signed)
Physical Therapy Session Note  Patient Details  Name: Frank [name] MRN: [id number] Date of Birth: 31-Jan-1956  Today's Date: 07/16/2014 PT Individual Time: 1100-1200 PT Individual Time Calculation (min): 60 min   Short Term Goals: Week 1:  PT Short Term Goal 1 (Week 1): Pt will move supine<>sit w/ SBA consistently PT Short Term Goal 2 (Week 1): Pt will propel w/c 100' w/ SBA consistently PT Short Term Goal 3 (Week 1): Pt will demonstrate appropriate pressure relief techniques with min cueing PT Short Term Goal 4 (Week 1): Pt will direct setup for bed<>w/c transfer w/ min cueing   Skilled Therapeutic Interventions/Progress Updates:    Pt received supine in bed, agreeable to participate in therapy. Session focused on upright tolerance, stair/gait training, transfer training. Blocked practice for squat pivot transfers bed<>w/c x5 w/ MinA initially, ModA with increased fatigue. Propelled w/c 120' w mod (I), ModA for managing w/c parts. Sit<>stand at edge of mat w/ MaxiSky lift, ModA from therapist to come to standing, TotalA to remain standing w/ MaxiSky. Pt continues to have difficulty maintaining hips over feet, requires Mod cueing to tuck hips forward to improve balance. Negotiated up/down 2 stairs w/ MaxiSky lift, ModA from therapist. Pt able to step up w/ R leg on standard stair, but then required MaxiSky to remain standing on step. Gait training 20'x2 w/ RW, MaxA from therapist to shift weight anterior during stance phase, MaxiSky harness for safety. On second bout therapist gave pt more slack from [facility] lift, noted increased difficulty maintaining knee extension in stance and difficulty unweighting feet to propel forward. Pt transported back to room via w/c, left seated in w/c w/ quick release belt on and all needs within reach.   Therapy Documentation Precautions:  Precautions Precautions: Fall Precaution Comments: h/o multiple falls Restrictions Weight Bearing Restrictions: No Pain:  No/denies pain  See FIM for current functional status  Therapy/Group: Individual Therapy  Rada Hay  Rada Hay, PT, DPT 07/16/2014, 7:47 AM

## 2014-07-16 NOTE — Progress Notes (Signed)
Chaplain received page that patient requests a visit by a chaplain named Alisia Ferrari.  Informed secretary that this chaplain is not aware of any chaplain by that name but will follow-up with other staff to investigate.    Dorathy Daft [place], [place]

## 2014-07-16 NOTE — Progress Notes (Addendum)
Frank [name] is a 58 y.o. male 1956-03-16 [id number]  Subjective: No new complaints. No new problems. Slept well. Feeling OK.  Objective: Vital signs in last 24 hours: Temp:  [98.3 F (36.8 C)-98.4 F (36.9 C)] 98.3 F (36.8 C) (12/06 0707) Pulse Rate:  [89-105] 89 (12/06 0707) Resp:  [17-18] 18 (12/06 0707) BP: (104-125)/(57-63) 125/63 mmHg (12/06 0707) SpO2:  [95 %-98 %] 95 % (12/06 0707) FiO2 (%):  [98 %] 98 % (12/05 1355) Weight change:  Last BM Date: 07/14/14  Intake/Output from previous day: 12/05 0701 - 12/06 0700 In: 1300 [P.O.:720; I.V.:580] Out: 1625 [Urine:1625]  Physical Exam General: No apparent distress - Improving BUE motor per his report Lungs: Normal effort. Lungs clear to auscultation, no crackles or wheezes. Cardiovascular: Regular rate and rhythm, no edema Neurological: No new neurological deficits Wounds: N/A     Lab Results: BMET    Component Value Date/Time   NA 143 07/12/2014 0455   K 4.2 07/12/2014 0455   CL 104 07/12/2014 0455   CO2 27 07/12/2014 0455   GLUCOSE 101* 07/12/2014 0455   BUN 16 07/12/2014 0455   CREATININE 0.88 07/12/2014 0455   CALCIUM 9.5 07/12/2014 0455   GFRNONAA >90 07/12/2014 0455   GFRAA >90 07/12/2014 0455   CBC    Component Value Date/Time   WBC 5.8 07/12/2014 0455   WBC 8.5 12/30/2013 1537   RBC 3.67* 07/12/2014 0455   RBC 4.62* 12/30/2013 1537   HGB 11.3* 07/12/2014 0455   HGB 13.8* 12/30/2013 1537   HCT 32.9* 07/12/2014 0455   HCT 43.0* 12/30/2013 1537   PLT 316 07/12/2014 0455   MCV 89.6 07/12/2014 0455   MCV 93.0 12/30/2013 1537   MCH 30.8 07/12/2014 0455   MCH 29.9 12/30/2013 1537   MCHC 34.3 07/12/2014 0455   MCHC 32.1 12/30/2013 1537   RDW 12.7 07/12/2014 0455   LYMPHSABS 2.3 07/12/2014 0455   MONOABS 0.5 07/12/2014 0455   EOSABS 0.2 07/12/2014 0455   BASOSABS 0.0 07/12/2014 0455   CBG's (last 3):    Recent Labs  07/15/14 2118 07/16/14 0032 07/16/14 0655  GLUCAP 126* 113* 104*    LFT's Lab Results  Component Value Date   ALT 37 07/12/2014   AST 16 07/12/2014   ALKPHOS 41 07/12/2014   BILITOT 0.2* 07/12/2014    Studies/Results: No results found.  Medications:  I have reviewed the patient's current medications. Scheduled Medications: . antiseptic oral rinse  7 mL Mouth Rinse BID  . chlorhexidine  15 mL Mouth/Throat BID  . DULoxetine  30 mg Oral Daily  . enoxaparin (LOVENOX) injection  40 mg Subcutaneous Q24H  . famotidine  20 mg Oral Daily  . feeding supplement (RESOURCE BREEZE)  1 Container Oral Daily  . insulin aspart  0-9 Units Subcutaneous TID WC  . methylphenidate  36 mg Oral Daily  . metroNIDAZOLE  500 mg Oral 3 times per day  . polyethylene glycol  17 g Oral Daily  . pregabalin  200 mg Oral BID   PRN Medications: acetaminophen, alum & mag hydroxide-simeth, bisacodyl, diphenhydrAMINE, guaiFENesin-dextromethorphan, LORazepam **OR** LORazepam, methocarbamol, ondansetron **OR** ondansetron (ZOFRAN) IV, oxyCODONE, traZODone  Assessment/Plan: Principal Problem:   Guillain Barr syndrome Active Problems:   Uncontrolled type II diabetes mellitus with polyneuropathy   C. difficile colitis  1. Functional deficits secondary to Guillain Barre Syndrome (vs CIDP) in patient with chronic peipheral neuropathy 2. DVT Prophylaxis/Anticoagulation: Pharmaceutical: Lovenox 3. Chronic Pain Management: on nucynta 100 mg tid PTA.  -substitute  oxycodone for nucynta (which isn't on formulary) -lyrica and cymbalta 4. Anxiety/depression/Mood: Continue Cymbalta for mood stabilization. Will continue ativan po prn for anxiety. LCSW to follow for evaluation and support.  -resumed concerta (lower than his presumed home dose for now) 5. Neuropsych: This patient is capable of making decisions on his own behalf. 6. Skin/Wound Care: Routine pressure relief measures 7. Fluids/Electrolytes/Nutrition: Monitor I/O. BMET within normal  limits today 8. HTN: Blood pressure controlled off medications.  9. Peripheral neuropathy: Continue lyrica bid with Cymbalta.  10. DM type 2: Continue to check BS ac/hs basis with SSI for elevated BS. May need glucotrol resumed as po intake improves. - sugars are controlled 11. Anemia: hgb stable  10 OSA: no new issues 11. GERD s/p recent dilatation of esophageal stricture:  12. C diff colitis: Flagyl through 12/14 --no loose stools at this time (in fact requests suppository)  13. UTI: 12/4 urine culture with prelim E coli - Given recent C diff, will await sens prior to starting antibiotics as afebrile; need to minimize duration of tx once sens known...  Length of stay, days: 5  Jaythan Hinely A. Asa Lente, MD 07/16/2014, 9:56 AM

## 2014-07-16 NOTE — Plan of Care (Signed)
Problem: RH SAFETY Goal: RH STG ADHERE TO SAFETY PRECAUTIONS W/ASSISTANCE/DEVICE STG Adhere to Safety Precautions With Assistance/Device. independent  Outcome: Progressing Goal: RH STG DECREASED RISK OF FALL WITH ASSISTANCE STG Decreased Risk of Fall With min Assistance.  Outcome: Progressing Goal: RH STG DEMO UNDERSTANDING HOME SAFETY PRECAUTIONS Pt able to verbalize understanding and demonstrate safe mobility by d/c.Min assist w/ transfers .  Outcome: Progressing  Problem: RH PAIN MANAGEMENT Goal: RH STG PAIN MANAGED AT OR BELOW PT'S PAIN GOAL Pain goal is that pain level is less than 2.  Outcome: Progressing

## 2014-07-17 ENCOUNTER — Inpatient Hospital Stay (HOSPITAL_COMMUNITY): Payer: BC Managed Care – PPO

## 2014-07-17 DIAGNOSIS — N390 Urinary tract infection, site not specified: Secondary | ICD-10-CM

## 2014-07-17 DIAGNOSIS — B9620 Unspecified Escherichia coli [E. coli] as the cause of diseases classified elsewhere: Secondary | ICD-10-CM

## 2014-07-17 LAB — GLUCOSE, CAPILLARY
GLUCOSE-CAPILLARY: 78 mg/dL (ref 70–99)
GLUCOSE-CAPILLARY: 142 mg/dL — AB (ref 70–99)
Glucose-Capillary: 132 mg/dL — ABNORMAL HIGH (ref 70–99)

## 2014-07-17 MED ORDER — LORAZEPAM 0.5 MG PO TABS
0.5000 mg | ORAL_TABLET | Freq: Four times a day (QID) | ORAL | Status: DC | PRN
  Administered 2014-07-21 – 2014-07-24 (×3): 0.5 mg via ORAL
  Filled 2014-07-17 (×3): qty 1

## 2014-07-17 MED ORDER — PHENAZOPYRIDINE HCL 100 MG PO TABS
100.0000 mg | ORAL_TABLET | Freq: Three times a day (TID) | ORAL | Status: AC
  Administered 2014-07-17 – 2014-07-19 (×5): 100 mg via ORAL
  Filled 2014-07-17 (×6): qty 1

## 2014-07-17 MED ORDER — CIPROFLOXACIN HCL 250 MG PO TABS
250.0000 mg | ORAL_TABLET | Freq: Two times a day (BID) | ORAL | Status: DC
  Administered 2014-07-17: 250 mg via ORAL
  Filled 2014-07-17 (×3): qty 1

## 2014-07-17 MED ORDER — LORAZEPAM 0.5 MG PO TABS
0.5000 mg | ORAL_TABLET | Freq: Every day | ORAL | Status: DC
  Administered 2014-07-17 – 2014-07-24 (×8): 0.5 mg via ORAL
  Filled 2014-07-17 (×8): qty 1

## 2014-07-17 MED ORDER — CEPHALEXIN 500 MG PO CAPS
500.0000 mg | ORAL_CAPSULE | Freq: Two times a day (BID) | ORAL | Status: AC
  Administered 2014-07-17 – 2014-07-22 (×10): 500 mg via ORAL
  Filled 2014-07-17 (×10): qty 1

## 2014-07-17 NOTE — Progress Notes (Signed)
Physical Therapy Session Note  Patient Details  Name: Frank [name] MRN: [id number] Date of Birth: 01-29-1956  Today's Date: 07/17/2014 PT Individual Time: 0800-0900 PT Individual Time Calculation (min): 60 min   Short Term Goals: Week 1:  PT Short Term Goal 1 (Week 1): Pt will move supine<>sit w/ SBA consistently PT Short Term Goal 2 (Week 1): Pt will propel w/c 100' w/ SBA consistently PT Short Term Goal 3 (Week 1): Pt will demonstrate appropriate pressure relief techniques with min cueing PT Short Term Goal 4 (Week 1): Pt will direct setup for bed<>w/c transfer w/ min cueing   Skilled Therapeutic Interventions/Progress Updates:   Session focused on functional bed and sitting balance EOB to don shirt and don shoes (A needed from therapist for shoes), squat pivot transfer with multiple attempts and mod A overall with cues for foot placement and anterior weightshift, w/c propulsion down to therapy gym with overall S for UE strengthening and endurance and neuro re-ed for balance and gait training using Maxi Sky and +2 assist for safety with overall mod A +2 (verbal and manual cues for upright posture, hip extension and knee extension, foot placement (did better with verbal cues to put feet in blocks on floor for visual feedback due to decreased proprioceptive input), BOS, and step length. Pt did not like visual feedback from the mirror but cues for foot placement using blocks on floor seemed to help patient control step length. Left up in w/c with safety belt on. RN made aware of pt questions regarding acid reflux medication as this was bothering pt during session.   Therapy Documentation Precautions:  Precautions Precautions: Fall Precaution Comments: h/o multiple falls Restrictions Weight Bearing Restrictions: No  Pain:  c/o generalized muscle soreness - no intervention needed at this time.   See FIM for current functional status  Therapy/Group: Individual Therapy  [name]  [facility] 07/17/2014, 10:09 AM

## 2014-07-17 NOTE — Progress Notes (Signed)
[name]IC[facility] & REHABILITATION     PROGRESS NOTE    Subjective/Complaints: Complains of urinary frequency and dysuria still A  review of systems has been performed and if not noted above is otherwise negative.   Objective: Vital Signs: Blood pressure 119/61, pulse 85, temperature 98.6 F (37 C), temperature source Oral, resp. rate 18, weight 84.188 kg (185 lb 9.6 oz), SpO2 95 %. No results found. No results for input(s): WBC, HGB, HCT, PLT in the last 72 hours. No results for input(s): NA, K, CL, GLUCOSE, BUN, CREATININE, CALCIUM in the last 72 hours.  Invalid input(s): CO CBG (last 3)   Recent Labs  07/16/14 1643 07/16/14 2129 07/17/14 0649  GLUCAP 115* 132* 132*    Wt Readings from Last 3 Encounters:  07/17/14 84.188 kg (185 lb 9.6 oz)  07/10/14 81.693 kg (180 lb 1.6 oz)  07/02/14 85.276 kg (188 lb)    Physical Exam:  Constitutional: He is oriented to person, place, and time. He appears well-developed. No distress.  HENT:  Head: Normocephalic and atraumatic.  Eyes: Conjunctivae and EOM are normal. Pupils are equal, round, and reactive to light.  Neck: No JVD present. No tracheal deviation present. No thyromegaly present.  Cardiovascular: Normal rate and regular rhythm.  Respiratory: No respiratory distress. He has no wheezes. He has no rales. He exhibits no tenderness.  GI: He exhibits no distension. There is no tenderness. There is no rebound and no guarding.  Musculoskeletal: He exhibits no edema or tenderness.  Lymphadenopathy:   He has no cervical adenopathy.  Neurological: He is alert and oriented to person, place, and time.  Diffuse sensory loss distal greater than proximal in stocking glove distribution, inconsistent in all 4 limbs, lower more affected than uppers. Motor: 4/5 deltoid, bicep, tricep, hands. LE: 3/5 hf, 3+ ke and 4/5 ankles--not consistent with effort, tended to be anxious. DTR's 1+ to absent throughout  Skin: Skin is warm.  He is not diaphoretic. IJ site irritated  Psychiatric:  Flat, slow to arouse  Assessment/Plan: 1. 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 Physiatrist is providing close team supervision and 24 hour management of active medical problems listed below. Physiatrist and rehab team continue to assess barriers to discharge/monitor patient progress toward functional and medical goals. FIM: FIM - Bathing Bathing Steps Patient Completed: Chest, Right Arm, Left Arm, Abdomen, Front perineal area, Buttocks, Right upper leg, Left upper leg, Right lower leg (including foot), Left lower leg (including foot) Bathing: 4: Steadying assist (patient ataxic in sitting)  FIM - Upper Body Dressing/Undressing Upper body dressing/undressing steps patient completed: Thread/unthread right sleeve of pullover shirt/dresss, Thread/unthread left sleeve of pullover shirt/dress, Put head through opening of pull over shirt/dress, Pull shirt over trunk Upper body dressing/undressing: 5: Set-up assist to: Obtain clothing/put away FIM - Lower Body Dressing/Undressing Lower body dressing/undressing steps patient completed: Thread/unthread right pants leg, Thread/unthread left pants leg Lower body dressing/undressing: 1: Total-Patient completed less than 25% of tasks (usiing STEDY)  FIM - Toileting Toileting steps completed by patient: Performs perineal hygiene Toileting Assistive Devices: Grab bar or rail for support Toileting: 1: Two helpers Clinical biochemist lift)  FIM - Radio producer Devices: Recruitment consultant Transfers: 0-Activity did not occur  FIM - Control and instrumentation engineer Devices: Arm rests Bed/Chair Transfer: 5: Supine > Sit: Supervision (verbal cues/safety issues), 3: Bed > Chair or W/C: Mod A (lift  or lower assist), 4: Chair or W/C > Bed: Min A  (steadying Pt. > 75%)  FIM - Locomotion: Wheelchair Distance: 150 Locomotion: Wheelchair: 5: Travels 150 ft or more: maneuvers on rugs and over door sills with supervision, cueing or coaxing FIM - Locomotion: Ambulation Locomotion: Ambulation Assistive Devices: Administrator Ambulation/Gait Assistance: 2: Max assist Locomotion: Ambulation: 1: Travels less than 50 ft with maximal assistance (Pt: 25 - 49%)  Comprehension Comprehension Mode: Auditory Comprehension: 5-Follows basic conversation/direction: With extra time/assistive device  Expression Expression Mode: Verbal Expression: 5-Expresses basic needs/ideas: With no assist  Social Interaction Social Interaction: 4-Interacts appropriately 75 - 89% of the time - Needs redirection for appropriate language or to initiate interaction.  Problem Solving Problem Solving: 5-Solves basic problems: With no assist  Memory Memory: 6-More than reasonable amt of time  Medical Problem List and Plan: 1. Functional deficits secondary to Guillain Barre Syndrome (vs CIDP) in patient with chronic peipheral neuropathy 2. DVT Prophylaxis/Anticoagulation: Pharmaceutical: Lovenox 3. Chronic Pain Management: Was on nucynta 100 mg tid PTA.  -substitute oxycodone for nucynta (which isn't on formulary) -lyrica and cymbalta 4. Anxiety/depression/Mood: Continue Cymbalta for mood stabilization. Will continue ativan po prn for anxiety. LCSW to follow for evaluation and support.   -resumed concerta (lower than his presumed home dose for now) 5. Neuropsych: This patient is capable of making decisions on his own behalf. 6. Skin/Wound Care: Routine pressure relief measures 7. Fluids/Electrolytes/Nutrition: Monitor I/O. BMET within normal limits today 8. HTN: Blood pressure controlled off medications.  9. Peripheral neuropathy: Continue lyrica bid with Cymbalta.  10. DM type 2: Continue to check BS ac/hs basis with SSI for  elevated BS. May need glucotrol resumed as po intake improves.   -for now sugars are controlled 11. Anemia: hgb up to 11.3  10 OSA: no new issues 11. GERD s/p recent dilatation of esophageal stricture:  12. C diff colitis: Flagyl through 12/14 --no more loose stools   13. Urinary frequency: 100k E Coli  -begin   cipro--5 day course  -pyridium for 2 days  LOS (Days) 6 A FACE TO FACE EVALUATION WAS PERFORMED  Frank [name] T 07/17/2014 8:36 AM

## 2014-07-17 NOTE — Progress Notes (Signed)
Physical Therapy Session Note  Patient Details  Name: Frank [name] MRN: [id number] Date of Birth: June 27, 1956  Today's Date: 07/17/2014 PT Individual Time: 7473-4037 PT Individual Time Calculation (min): 33 min   Short Term Goals: Week 1:  PT Short Term Goal 1 (Week 1): Pt will move supine<>sit w/ SBA consistently PT Short Term Goal 2 (Week 1): Pt will propel w/c 100' w/ SBA consistently PT Short Term Goal 3 (Week 1): Pt will demonstrate appropriate pressure relief techniques with min cueing PT Short Term Goal 4 (Week 1): Pt will direct setup for bed<>w/c transfer w/ min cueing   Skilled Therapeutic Interventions/Progress Updates:    Pt received seated in w/c, agreeable to participate in therapy. Session focused on gait training overground. Propelled w/c 150' to/from hallway outside rehab gym.   Gait training 35'x2 w/ RW, ModA from therapist, close w/c follow. Therapist provided assist to prevent R knee buckling in stance, manual facilitation on R glute during stance, and manual progressing to verbal facilitation for upright positioning. Pt continues to require +2 A for close w/c follow due to impairments in motor control during turning.  In room pt stood w/ RW x2 in order to use urinal. ModA to come to standing, CGA-MinA to remain standing w/ max verbal cueing for upright positioning.  Session ended in pt's room, left seated in w/c w/ quick release belt on and all needs within reach.   Therapy Documentation Precautions:  Precautions Precautions: Fall Precaution Comments: h/o multiple falls Restrictions Weight Bearing Restrictions: No Pain:  No/denies pain  See FIM for current functional status  Therapy/Group: Individual Therapy  Rada Hay  Rada Hay, PT, DPT 07/17/2014, 7:50 AM

## 2014-07-17 NOTE — Progress Notes (Signed)
Physical Therapy Session Note  Patient Details  Name: Frank [name] MRN: [id number] Date of Birth: 04/15/1956  Today's Date: 07/17/2014 PT Individual Time: 1445-1515 PT Individual Time Calculation (min): 30 min   Short Term Goals: Week 1:  PT Short Term Goal 1 (Week 1): Pt will move supine<>sit w/ SBA consistently PT Short Term Goal 2 (Week 1): Pt will propel w/c 100' w/ SBA consistently PT Short Term Goal 3 (Week 1): Pt will demonstrate appropriate pressure relief techniques with min cueing PT Short Term Goal 4 (Week 1): Pt will direct setup for bed<>w/c transfer w/ min cueing      Skilled Therapeutic Interventions/Progress Updates:  W/c propulsion x 150' using bil UEs, Theraband on w/c rims for increased sensation/grip, with supervision.  PT suggested techniques to increase efficiency of propulsion, which pt quickly started using.  W/c>< mat with mod assist, max VCS, visual cues for w/c set up, hand placement.  Although transferring to R, pt initially attempted to lift up L armrest.  Pt tends to attempt standing with narrow BOS.   neuromuscular re-education via VCs, forced use, visual cues for: -trunk shortening/lengthening/rotating in unsupported sitting, reaching laterally and forward with R and L hands to grasp cones, slightly out of BOS. -sit> stand x 3 focusing on foot placement and wt shifting forward -pre-gait standing with RW, working on R and L stance stability during wt shifting.  R knee consistently buckled with R wt bearing.      Returned to room; quick release belt applied..  Pt's life partner had questions about pt's prognosis; PT referred him to PA. Therapy Documentation Precautions:  Precautions Precautions: Fall Precaution Comments: h/o multiple falls Restrictions Weight Bearing Restrictions: No   Pain: Pain Assessment Pain Assessment: 0-10 Pain Score: 4    Locomotion : Wheelchair Mobility Distance: 150     See FIM for current functional  status  Therapy/Group: Individual Therapy  [name] 07/17/2014, 3:29 PM

## 2014-07-17 NOTE — Progress Notes (Signed)
Occupational Therapy Session Note  Patient Details  Name: Frank [name] MRN: [id number] Date of Birth: 09/12/1955  Today's Date: 07/17/2014 OT Individual Time: 1100-1205 OT Individual Time Calculation (min): 65 min   Short Term Goals: Week 1:  OT Short Term Goal 1 (Week 1): Pt will complete LB bathing with mod A.  OT Short Term Goal 2 (Week 1): Pt will complete LB dressing with mod A. OT Short Term Goal 3 (Week 1): Pt will complete toilet transfer with mod A.  OT Short Term Goal 4 (Week 1): Pt will complete shower transfer with mod A. OT Short Term Goal 5 (Week 1): Pt will be educated on use of AE for increasing independence in self-feeding.  Skilled Therapeutic Interventions/Progress Updates: ADL-retraining with focus on improved dynamic sitting balance, functional transfer: bed>w/c, w/c> toilet, tub bench>w/c, sit<>stand, static standing, and improved coordination of BUE.   Pt received supine in bed discussing care with RN.    Pt receptive for treatment, alert and oriented and able to process instructions given extra time and elaboration on goals/methods of rehab.   Pt performed squat-pivot transfer (bed>wc), stand-pivot transfer (w/c>BSC over toilet), and ambulated from toilet to tub bench in walk in shower.   Pt bathed seated on bench but stood when cued to allow assist with washing buttocks and later to assist with pulling up brief and pants.    Pt exhibits moderate ataxia with BUE during bathing/dressing/grooming is persistent to attempt tasks despite mild frustration with impaired sensation, coordination and FMc.   STEDY not used during entire session however pt requires moderate assist and close contact to reduce anxiety while attempting movements.     Therapy Documentation Precautions:  Precautions Precautions: Fall Precaution Comments: h/o multiple falls Restrictions Weight Bearing Restrictions: No  Pain: Pain Assessment Pain Assessment: 0-10 Pain Score: 8  Pain Type: Acute  pain Pain Location: Generalized Pain Orientation: Right;Left Pain Descriptors / Indicators: Aching;Numbness Pain Frequency: Constant Pain Onset: On-going Patients Stated Pain Goal: 3 Pain Intervention(s): Medication (See eMAR);Repositioned Multiple Pain Sites: No  See FIM for current functional status  Therapy/Group: Individual Therapy  [place] 07/17/2014, 12:33 PM

## 2014-07-17 NOTE — Plan of Care (Signed)
Problem: RH SAFETY Goal: RH STG ADHERE TO SAFETY PRECAUTIONS W/ASSISTANCE/DEVICE STG Adhere to Safety Precautions With Assistance/Device. independent  Outcome: Progressing Goal: RH STG DECREASED RISK OF FALL WITH ASSISTANCE STG Decreased Risk of Fall With min Assistance.  Outcome: Progressing Goal: RH STG DEMO UNDERSTANDING HOME SAFETY PRECAUTIONS Pt able to verbalize understanding and demonstrate safe mobility by d/c.Min assist w/ transfers .  Outcome: Progressing Goal: RH OTHER STG SAFETY GOALS W/ASSIST Other STG Safety Goals With min Assistance.  Outcome: Not Applicable Date Met:  23/95/32  Problem: RH PAIN MANAGEMENT Goal: RH STG PAIN MANAGED AT OR BELOW PT'S PAIN GOAL Pain goal is that pain level is less than 2.  Outcome: Progressing Goal: RH OTHER STG PAIN MANAGEMENT GOALS W/ASSIST Other STG Pain Management Goals With [place].  Outcome: Not Applicable Date Met:  02/33/43  Problem: RH KNOWLEDGE DEFICIT Goal: RH STG INCREASE KNOWLEDGE OF DIABETES Pt abel to verbalize understanding of diabetes management and pt and or caregiver able to check sugars and administer meds via teach-back.  Outcome: Progressing Goal: RH STG INCREASE KNOWLEDGE OF HYPERTENSION Pt able to verbalize understanding of hypertension and management w. Diet and meds via teach-back method.  Outcome: Progressing

## 2014-07-17 NOTE — Plan of Care (Signed)
Problem: RH SAFETY Goal: RH STG ADHERE TO SAFETY PRECAUTIONS W/ASSISTANCE/DEVICE STG Adhere to Safety Precautions With Assistance/Device. independent  Outcome: Progressing Goal: RH STG DECREASED RISK OF FALL WITH ASSISTANCE STG Decreased Risk of Fall With min Assistance.  Outcome: Progressing Goal: RH STG DEMO UNDERSTANDING HOME SAFETY PRECAUTIONS Pt able to verbalize understanding and demonstrate safe mobility by d/c.Min assist w/ transfers .  Outcome: Progressing Goal: RH OTHER STG SAFETY GOALS W/ASSIST Other STG Safety Goals With min Assistance.  Outcome: Progressing  Problem: RH PAIN MANAGEMENT Goal: RH STG PAIN MANAGED AT OR BELOW PT'S PAIN GOAL Pain goal is that pain level is less than 2.  Outcome: Progressing Goal: RH OTHER STG PAIN MANAGEMENT GOALS W/ASSIST Other STG Pain Management Goals With [place].  Outcome: Progressing

## 2014-07-18 ENCOUNTER — Inpatient Hospital Stay (HOSPITAL_COMMUNITY): Payer: BC Managed Care – PPO | Admitting: *Deleted

## 2014-07-18 ENCOUNTER — Encounter (HOSPITAL_COMMUNITY): Payer: BC Managed Care – PPO | Admitting: Occupational Therapy

## 2014-07-18 ENCOUNTER — Inpatient Hospital Stay (HOSPITAL_COMMUNITY): Payer: BC Managed Care – PPO | Admitting: Occupational Therapy

## 2014-07-18 LAB — GLUCOSE, CAPILLARY
Glucose-Capillary: 89 mg/dL (ref 70–99)
Glucose-Capillary: 96 mg/dL (ref 70–99)
Glucose-Capillary: 97 mg/dL (ref 70–99)
Glucose-Capillary: 100 mg/dL — ABNORMAL HIGH (ref 70–99)
Glucose-Capillary: 137 mg/dL — ABNORMAL HIGH (ref 70–99)

## 2014-07-18 MED ORDER — FAMOTIDINE 40 MG PO TABS
40.0000 mg | ORAL_TABLET | Freq: Two times a day (BID) | ORAL | Status: DC
  Administered 2014-07-18 – 2014-07-29 (×23): 40 mg via ORAL
  Filled 2014-07-18 (×25): qty 1

## 2014-07-18 NOTE — Plan of Care (Signed)
Problem: RH PAIN MANAGEMENT Goal: RH STG PAIN MANAGED AT OR BELOW PT'S PAIN GOAL Pain goal is that pain level is less than 3.  Outcome: Adequate for Discharge

## 2014-07-18 NOTE — Progress Notes (Signed)
Occupational Therapy Session Notes  Patient Details  Name: Dean Goldner MRN: [id number] Date of Birth: [birth year]  Today's Date: 07/18/2014  Short Term Goals: Week 1:  OT Short Term Goal 1 (Week 1): Pt will complete LB bathing with mod A.  OT Short Term Goal 2 (Week 1): Pt will complete LB dressing with mod A. OT Short Term Goal 3 (Week 1): Pt will complete toilet transfer with mod A.  OT Short Term Goal 4 (Week 1): Pt will complete shower transfer with mod A. OT Short Term Goal 5 (Week 1): Pt will be educated on use of AE for increasing independence in self-feeding.  Skilled Therapeutic Interventions/Progress Updates:   Session #1 [phone] - 60 Minutes Individual Therapy Patient with 8/10 pain on "legs, arms, hands, and butt". Notified RN of patients request for pain medication. Patient received supine in bed with HOB raised, sleepy and barely able to keep eyes. Therapist donned light and lowered bed for safe and effective transfer. Patient more awake and able to perform bed mobility with supervision. Patient then stood with STEDY and therapist assisted patient into bathroom for shower stall transfer onto tub transfer bench. Patient completed UB/LB bathing in seated position using bath mit to increase independence. Patient dried, stood with STEDY, and therapist assisted patient back to room for UB/LB dressing from a w/c level, in sit<>stand position using STEDY. RN present for medication management. Therapist wrapped patient's right foot with ace wrap secondary to patient's complaints of pain in right foot. Patient took more than a reasonable amount of time to complete tasks, but with notable decreased ataxic movements today. At end of session, left patient seated in w/c with all needs within reach and quick release belt donned.    Session #2 1300-1400 - 60 Minutes Individual Therapy No complaints of pain  Patient received seated in w/c eager for therapy. Patient washed hands and  therapist wiped down all needed equipment. Patient then focused on w/c mobility/maneuvering, propelled > therapy gym and transferred w/c>therapy mat with min assist. Patient performed 9-hole peg test bilaterally; patient able to get 3 pegs in with R hand and 4 pegs in with L hand (both timed at 5 minutes each). Patient then engaged in therapeutic activity focusing on trunk control and core strengthening. Therapist encouraged deep breathing techniques that  facilitated more fluid, coordinated movements.  Patient then transferred back to w/c with min assist and propelled self back to room. Left patient seated in w/c with all needs within reach and quick release belt donned.   Precautions:  Precautions Precautions: Fall Precaution Comments: h/o multiple falls Restrictions Weight Bearing Restrictions: No  See FIM for current functional status  Jolie Strohecker 07/18/2014, 7:23 AM

## 2014-07-18 NOTE — Plan of Care (Signed)
Problem: RH SAFETY Goal: RH STG ADHERE TO SAFETY PRECAUTIONS W/ASSISTANCE/DEVICE STG Adhere to Safety Precautions With Supervision Assistance/Device  Outcome: Progressing

## 2014-07-18 NOTE — Progress Notes (Addendum)
[facility] MEDICINE & REHABILITATION     PROGRESS NOTE    Subjective/Complaints: Dysuria still present. Having a lot of reflux. Has questions about recovery A  review of systems has been performed and if not noted above is otherwise negative.   Objective: Vital Signs: Blood pressure 149/61, pulse 72, temperature 98.6 F (37 C), temperature source Oral, resp. rate 18, weight 84.188 kg (185 lb 9.6 oz), SpO2 95 %. No results found. No results for input(s): WBC, HGB, HCT, PLT in the last 72 hours. No results for input(s): NA, K, CL, GLUCOSE, BUN, CREATININE, CALCIUM in the last 72 hours.  Invalid input(s): CO CBG (last 3)   Recent Labs  07/17/14 1213 07/17/14 2117 07/18/14 0639  GLUCAP 78 142* 96    Wt Readings from Last 3 Encounters:  07/17/14 84.188 kg (185 lb 9.6 oz)  07/10/14 81.693 kg (180 lb 1.6 oz)  07/02/14 85.276 kg (188 lb)    Physical Exam:  Constitutional: He is oriented to person, place, and time. He appears well-developed. No distress.  HENT:  Head: Normocephalic and atraumatic.  Eyes: Conjunctivae and EOM are normal. Pupils are equal, round, and reactive to light.  Neck: No JVD present. No tracheal deviation present. No thyromegaly present.  Cardiovascular: Normal rate and regular rhythm.  Respiratory: No respiratory distress. He has no wheezes. He has no rales. He exhibits no tenderness.  GI: He exhibits no distension. There is no tenderness. There is no rebound and no guarding.  Musculoskeletal: He exhibits no edema or tenderness.  Lymphadenopathy:   He has no cervical adenopathy.  Neurological: He is alert and oriented to person, place, and time.  Diffuse sensory loss distal greater than proximal in stocking glove distribution, inconsistent in all 4 limbs, lower more affected than uppers. Motor: 4/5 deltoid, bicep, tricep, hands. LE: 3/5 hf, 3+ ke and 4/5 ankles--not consistent with effort, tended to be anxious. DTR's 1+ to absent throughout   Skin: Skin is warm. Some redness around sacrum and gluts--  Psychiatric:  Flat, slow to arouse  Assessment/Plan: 1. 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 Physiatrist is providing close team supervision and 24 hour management of active medical problems listed below. Physiatrist and rehab team continue to assess barriers to discharge/monitor patient progress toward functional and medical goals. FIM: FIM - Bathing Bathing Steps Patient Completed: Chest, Right Arm, Left Arm, Abdomen, Front perineal area, Right upper leg, Left upper leg Bathing: 3: Mod-Patient completes 5-7 24f 10 parts or 50-74%  FIM - Upper Body Dressing/Undressing Upper body dressing/undressing steps patient completed: Thread/unthread right sleeve of pullover shirt/dresss, Thread/unthread left sleeve of pullover shirt/dress, Put head through opening of pull over shirt/dress, Pull shirt over trunk Upper body dressing/undressing: 5: Set-up assist to: Obtain clothing/put away FIM - Lower Body Dressing/Undressing Lower body dressing/undressing steps patient completed: Thread/unthread right pants leg, Thread/unthread left pants leg Lower body dressing/undressing: 2: Max-Patient completed 25-49% of tasks  FIM - Toileting Toileting steps completed by patient: Adjust clothing prior to toileting Toileting Assistive Devices: Grab bar or rail for support Toileting: 4: Steadying assist  FIM - Radio producer Devices: Bedside commode, Grab bars Toilet Transfers: 3-To toilet/BSC: Mod A (lift or lower assist), 3-From toilet/BSC: Mod A (lift or lower assist)  FIM - Control and instrumentation engineer Devices: Arm rests, Bed rails Bed/Chair Transfer: 0: Activity did not occur  FIM - Locomotion: Wheelchair  Distance: 150 Locomotion: Wheelchair: 5: Travels 150 ft or more:  maneuvers on rugs and over door sills with supervision, cueing or coaxing FIM - Locomotion: Ambulation Locomotion: Ambulation Assistive Devices: Administrator Ambulation/Gait Assistance: 3: Mod assist (+2 just for close w/c follow) Locomotion: Ambulation: 0: Activity did not occur  Comprehension Comprehension Mode: Auditory Comprehension: 5-Follows basic conversation/direction: With no assist  Expression Expression Mode: Verbal Expression: 5-Expresses complex 90% of the time/cues < 10% of the time  Social Interaction Social Interaction: 4-Interacts appropriately 75 - 89% of the time - Needs redirection for appropriate language or to initiate interaction.  Problem Solving Problem Solving: 5-Solves basic 90% of the time/requires cueing < 10% of the time  Memory Memory: 4-Recognizes or recalls 75 - 89% of the time/requires cueing 10 - 24% of the time  Medical Problem List and Plan: 1. Functional deficits secondary to Guillain Barre Syndrome (vs CIDP) in patient with chronic peipheral neuropathy 2. DVT Prophylaxis/Anticoagulation: Pharmaceutical: Lovenox 3. Chronic Pain Management: Was on nucynta 100 mg tid PTA.  -substitute oxycodone for nucynta (which isn't on formulary) -lyrica and cymbalta 4. Anxiety/depression/Mood: Continue Cymbalta for mood stabilization. Will continue ativan po prn for anxiety. LCSW to follow for evaluation and support.   -resumed concerta (lower than his presumed home dose for now) 5. Neuropsych: This patient is capable of making decisions on his own behalf. 6. Skin/Wound Care: Routine pressure relief measures 7. Fluids/Electrolytes/Nutrition: Monitor I/O. BMET within normal limits today 8. HTN: Blood pressure controlled off medications.  9. Peripheral neuropathy: Continue lyrica bid with Cymbalta.  10. DM type 2:   May need glucotrol resumed as po intake improves.   -for now sugars are controlled 11. Anemia: hgb up to  11.3  10 OSA: no new issues 11. GERD s/p recent dilatation of esophageal stricture:   -increase pepcid to 40mg  bid 12. C diff colitis: Flagyl through 12/14 --no more loose stools   13. Urinary frequency: 100k E Coli  -  cipro--changed to keflex per pharm recs pertaining to c diff risk  -pyridium for 2 days  LOS (Days) 7 A FACE TO FACE EVALUATION WAS PERFORMED  SWARTZ,ZACHARY T 07/18/2014 7:20 AM

## 2014-07-18 NOTE — Progress Notes (Signed)
Physical Therapy Session Note  Patient Details  Name: Frank [name] MRN: [id number] Date of Birth: [birth year]  Today's Date: 07/18/2014 PT Individual Time: 1000-1100 PT Individual Time Calculation (min): 60 min   Short Term Goals: Week 1:  PT Short Term Goal 1 (Week 1): Pt will move supine<>sit w/ SBA consistently PT Short Term Goal 2 (Week 1): Pt will propel w/c 100' w/ SBA consistently PT Short Term Goal 3 (Week 1): Pt will demonstrate appropriate pressure relief techniques with min cueing PT Short Term Goal 4 (Week 1): Pt will direct setup for bed<>w/c transfer w/ min cueing   Skilled Therapeutic Interventions/Progress Updates:    Patient received sitting in wheelchair. Session focused on wheelchair mobility, functional transfers, gait training, static standing balance/core NMR, and B LE NMR. Wheelchair mobility >150'x2 with B LE (theraband already on wheelchair rims) and supervision. Repeated sit<>stands from wheelchair>RW with prolonged standing with emphasis on core control and proper sequencing. Patient impulsive with sit<>stands with incorrect hand placement and attempting to stand prior to therapist being ready.  Gait training 3' x1 and 10' x1 with RW and modA, +2 for close wheelchair follow. Patient demonstrating what appears to be mild truncal ataxia with difficulty maintaining static standing position. Patient with several rapid knee flexion moments R LE>L LE and requires cues for self monitoring of fatigue. Wheelchair mobility forwards x75' and retropulsion x75' with B LE for quad and hamstring NMR.  Patient requesting to learn exercises to perform sitting in wheelchair. Demonstrated and patient performed 15 reps of each seated in wheelchair: hip flexion, knee flex, knee ext, hip abd/add, and heel raises and toe rises. Patient returned to room and left sitting in wheelchair with all needs within reach.  Therapy Documentation Precautions:  Precautions Precautions: Fall Precaution  Comments: h/o multiple falls Restrictions Weight Bearing Restrictions: No Pain: Pain Assessment Pain Assessment: No/denies pain Pain Score: 0-No pain Pain Type: Acute pain Pain Location: Generalized Pain Orientation: Right;Left Pain Descriptors / Indicators: Aching Pain Frequency: Constant Pain Onset: On-going Patients Stated Pain Goal: 4 Pain Intervention(s): Medication (See eMAR);Repositioned Multiple Pain Sites: No Locomotion : Ambulation Ambulation/Gait Assistance: 3: Mod assist;1: +2 Total assist (+2 for wheelchair follow) Wheelchair Mobility Distance: 150   See FIM for current functional status  Therapy/Group: Individual Therapy  Lillia Abed. Makenzee Choudhry, PT, DPT 07/18/2014, 11:30 AM

## 2014-07-18 NOTE — Plan of Care (Signed)
Problem: RH SAFETY Goal: RH STG DECREASED RISK OF FALL WITH ASSISTANCE STG Decreased Risk of Fall With min Assistance.  Outcome: Progressing

## 2014-07-19 ENCOUNTER — Inpatient Hospital Stay (HOSPITAL_COMMUNITY): Payer: BC Managed Care – PPO | Admitting: Occupational Therapy

## 2014-07-19 ENCOUNTER — Inpatient Hospital Stay (HOSPITAL_COMMUNITY): Payer: BC Managed Care – PPO

## 2014-07-19 ENCOUNTER — Encounter (HOSPITAL_COMMUNITY): Payer: BC Managed Care – PPO | Admitting: Occupational Therapy

## 2014-07-19 ENCOUNTER — Inpatient Hospital Stay (HOSPITAL_COMMUNITY): Payer: BC Managed Care – PPO | Admitting: *Deleted

## 2014-07-19 LAB — GLUCOSE, CAPILLARY
Glucose-Capillary: 103 mg/dL — ABNORMAL HIGH (ref 70–99)
Glucose-Capillary: 93 mg/dL (ref 70–99)
Glucose-Capillary: 92 mg/dL (ref 70–99)
Glucose-Capillary: 118 mg/dL — ABNORMAL HIGH (ref 70–99)

## 2014-07-19 NOTE — Progress Notes (Signed)
Occupational Therapy Session Notes & Weekly Progress Note  Patient Details  Name: Frank [name] MRN: [id number] Date of Birth: [birth year]   Today's Date: 07/19/2014   SESSION NOTES   Session #1 [id number] - 24 Minutes Individual Therapy Patient with "the usual" complaints of pain, "all over". Patient rated pain 8/10, but stated he recently received pain medication Patient received supine in bed. Patient engaged in bed mobility with supervision and sat EOB. Patient stood with STEDY and therapist assisted patient > bathroom for shower stall transfer. Patient completed UB/LB bathing in seated position, performing lateral leans for peri cleansing and using 2 bath mitts(one for each hand) and a long handled sponge to increase independence. Patient transferred out of shower using STEDY, then completed UB/LB dressing from w/c level and using STEDY with minimal assistance from therapist. Patient sat in STEDY to don shirt, focusing on trunk control. Patient continues to take more than a reasonable amount of time to complete tasks. Patient's ataxic movements continue to decrease. Therapist donned ace wrap> right foot secondary to patient's consistent complaints of discomfort in foot/ankle. At end of session, left patient seated in w/c with quick release belt donned and all needs within reach.   Session #2 1300-1400 - 60 Minutes Individual Therapy Patient with complaints of pain "all over" RN aware Patient received seated in w/c. Patient washed hands and therapist wiped down appropriate w/c parts. Patient then propelled self > therapy gym. Patient transferred w/c>edge of mat and skilled intervention focused on dynamic sitting balance/tolerance/endurance, trunk/core strengthening exercises, BUE strengthening exercises, coordination exercises, and overall activity tolerance/endurance. Therapist gave patient a handout for exercises and encouraged him to complete these as a HEP. Patient transferred back to w/c and  patient worked on sit<>stands and static standing in parallel bars. Therapist then assisted patient back to room and left patient seated in w/c with all needs within reach.   ---------------------------------------------------------------------------------------------------------------------  WEEKLY PROGRESS NOTE  Beginning of progress report period: July 11, 2014 End of progress report period: July 19, 2014  Patient has met 5 of 5 short term goals. Patient is making progress on CIR. Patient has met all STGs so far. Patient continues to use STEDY for functional transfers, but is able to perform sit<>stands using STEDY with minimal assistance. STGs for next week are set for patient to complete all transfers without STEDY at a consistent mod assist level. Anticipated d/c date =12/19 with supervision LTGs set.   Patient continues to demonstrate the following deficits: decreased independence with ADLs, decreased independence with IADLs, decreased sit<>stands, decreased dynamic sitting & standing balance/tolerance/endurance, decreased activity tolerance/endurance, increased ataxic movements, decreased functional use of BUEs. Therefore, patient will continue to benefit from skilled OT intervention to enhance overall performance with BADL and Reduce care partner burden.  Patient progressing toward long term goals..  Continue plan of care.  OT Short Term Goals Week 1:  OT Short Term Goal 1 (Week 1): Pt will complete LB bathing with mod A.  OT Short Term Goal 1 - Progress (Week 1): Met OT Short Term Goal 2 (Week 1): Pt will complete LB dressing with mod A. OT Short Term Goal 2 - Progress (Week 1): Met OT Short Term Goal 3 (Week 1): Pt will complete toilet transfer with mod A.  OT Short Term Goal 3 - Progress (Week 1): Met OT Short Term Goal 4 (Week 1): Pt will complete shower transfer with mod A. OT Short Term Goal 4 - Progress (Week 1): Met OT Short Term Goal 5 (  Week 1): Pt will be educated on  use of AE for increasing independence in self-feeding. OT Short Term Goal 5 - Progress (Week 1): Met   Week 2:  OT Short Term Goal 1 (Week 2): Patient will complete shower stall transfer without use of STEDY at a consistant mod assist level  OT Short Term Goal 2 (Week 2): Patient will complete toilet transfer without use of STEDY at a consistant mod assist level OT Short Term Goal 3 (Week 2): Patient will be independent with a BUE/core strengthening HEP OT Short Term Goal 4 (Week 2): Patient will perform sit<>stands using RW in order to pull up pants  Skilled Therapeutic Interventions/Progress Updates:  Balance/vestibular training;Discharge planning;Community reintegration;DME/adaptive equipment instruction;Functional mobility training;Psychosocial support;Pain management;Self Care/advanced ADL retraining;Therapeutic Activities;Therapeutic Exercise;UE/LE Coordination activities;UE/LE Strength taining/ROM;Wheelchair propulsion/positioning   Precautions:  Precautions Precautions: Fall Precaution Comments: h/o multiple falls Restrictions Weight Bearing Restrictions: No  See FIM for current functional status  Frank [name] 07/19/2014, 8:15 AM

## 2014-07-19 NOTE — Plan of Care (Signed)
Problem: RH SAFETY Goal: RH STG ADHERE TO SAFETY PRECAUTIONS W/ASSISTANCE/DEVICE STG Adhere to Safety Precautions With Supervision Assistance/Device  Outcome: Progressing Goal: RH STG DECREASED RISK OF FALL WITH ASSISTANCE STG Decreased Risk of Fall With min Assistance.  Outcome: Progressing Goal: RH STG DEMO UNDERSTANDING HOME SAFETY PRECAUTIONS Pt able to verbalize understanding and demonstrate safe mobility by d/c.Min assist w/ transfers .  Outcome: Progressing  Problem: RH PAIN MANAGEMENT Goal: RH STG PAIN MANAGED AT OR BELOW PT'S PAIN GOAL Pain goal is that pain level is less than 3.  Outcome: Progressing

## 2014-07-19 NOTE — Progress Notes (Signed)
Recreational Therapy Session Note  Patient Details  Name: Kaiyu Mirabal MRN: [id number] Date of Birth: 1956-05-04 Today's Date: 07/19/2014  Pain: no c/o Skilled Therapeutic Interventions/Progress Updates: Continued today with discussion about pt's leisure interest and methods of stress management while pt ate his breakfast.  Assisted with set up of meal try, cutting up items on tray and opening juice container.  Pt able to self feed using utensils with red foam hand grip with set up assist.    Therapy/Group: Individual Therapy  Mili Piltz 07/19/2014, 4:30 PM

## 2014-07-19 NOTE — Progress Notes (Signed)
Physical Therapy Session Note  Patient Details  Name: Frank [name] MRN: [id number] Date of Birth: [birth year]  Today's Date: 07/19/2014 PT Individual Time: 1030-1130 PT Individual Time Calculation (min): 60 min   Short Term Goals: Week 1:  PT Short Term Goal 1 (Week 1): Pt will move supine<>sit w/ SBA consistently PT Short Term Goal 2 (Week 1): Pt will propel w/c 100' w/ SBA consistently PT Short Term Goal 3 (Week 1): Pt will demonstrate appropriate pressure relief techniques with min cueing PT Short Term Goal 4 (Week 1): Pt will direct setup for bed<>w/c transfer w/ min cueing   Skilled Therapeutic Interventions/Progress Updates:    Pt received seated in w/c, agreeable to participate in therapy. Session focused on transfer training, bed mobility, proximal muscle strengthening, gait training. Pt propelled w/c 150' to rehab gym w/ SBA.  Blocked practice squat pivot transfer training to R and L w/c<>mat table x8, pt able to consistently transfer at [facility] level with good setup (pt able to direct setup and name steps for transfers w/ 50% accuracy, min question cueing). Blocked practice supine<>sit x8 w/ log roll technique and moving sidelying<>sit, pt able to consistently move w/ SBA and cueing for technique.  Supine on bed pt completed x10 bridges, x10 hip flexion from hook lying, x6 SLR on R and x10 SLR on L for proximal muscle strengthening to increase independence with sit<>stand and squat pivot transfers.  In hallway pt ambulated 20' w/ RW, ModA from therapist, visual feedback from mirror and green center line, and close w/c follow. Pt able to use mirror to place feet safely, required assist to maintain knee extension in stance on R, maintain upright positioning, maintain RW on straight path.  Session ended in pt's room, pt left seated in w/c w/ quick release belt on and all needs within reach.   Therapy Documentation Precautions:  Precautions Precautions: Fall Precaution Comments: h/o  multiple falls Restrictions Weight Bearing Restrictions: No Pain:  No/denies pain  See FIM for current functional status  Therapy/Group: Individual Therapy  Rada Hay  Rada Hay, PT, DPT 07/19/2014, 7:46 AM

## 2014-07-19 NOTE — Progress Notes (Signed)
Social Work Patient ID: Frank [name], male   DOB: [birth year], 58 y.o.   MRN: [id number]   CSW faxed pt's disability paperwork 07-18-14 to [facility] at pt's request after Dr. Naaman Plummer completed it.  Called today to follow up with [name] at [facility] to make sure she received paperwork and see if she wanted copies of pt's medical records.  She stated that she is talking with the pt and his partner to see if Dr. [name], pt's neurologist would complete the paperwork since he saw pt in October 2015 and our physicians just met pt when he was admitted to the hospital in November 2015.  She will follow up from here.  CSW gave pt original copies of paperwork for his records.

## 2014-07-19 NOTE — Progress Notes (Signed)
[facility] PHYSICAL MEDICINE & REHABILITATION     PROGRESS NOTE    Subjective/Complaints: Feeling better. Happy with progress. Slept fairly well last night.  A  review of systems has been performed and if not noted above is otherwise negative.   Objective: Vital Signs: Blood pressure 109/61, pulse 80, temperature 98.3 F (36.8 C), temperature source Oral, resp. rate 17, weight 82.3 kg (181 lb 7 oz), SpO2 96 %. No results found. No results for input(s): WBC, HGB, HCT, PLT in the last 72 hours. No results for input(s): NA, K, CL, GLUCOSE, BUN, CREATININE, CALCIUM in the last 72 hours.  Invalid input(s): CO CBG (last 3)   Recent Labs  07/18/14 1646 07/18/14 2122 07/19/14 0650  GLUCAP 100* 137* 103*    Wt Readings from Last 3 Encounters:  07/19/14 82.3 kg (181 lb 7 oz)  07/10/14 81.693 kg (180 lb 1.6 oz)  07/02/14 85.276 kg (188 lb)    Physical Exam:  Constitutional: He is oriented to person, place, and time. He appears well-developed. No distress.  HENT:  Head: Normocephalic and atraumatic.  Eyes: Conjunctivae and EOM are normal. Pupils are equal, round, and reactive to light.  Neck: No JVD present. No tracheal deviation present. No thyromegaly present.  Cardiovascular: Normal rate and regular rhythm.  Respiratory: No respiratory distress. He has no wheezes. He has no rales. He exhibits no tenderness.  GI: He exhibits no distension. There is no tenderness. There is no rebound and no guarding.  Musculoskeletal: He exhibits no edema or tenderness.  Lymphadenopathy:   He has no cervical adenopathy.  Neurological: He is alert and oriented to person, place, and time.  Diffuse sensory loss distal greater than proximal in stocking glove distribution, inconsistent in all 4 limbs, lower more affected than uppers. Motor: 4/5 deltoid, bicep, tricep, hands. LE: 3/5 hf, 3+ ke and 4/5 ankles--not consistent with effort, tended to be anxious. DTR's 1+ to absent throughout  Skin:  Skin is warm. Some redness around sacrum and gluts--  Psychiatric:  Flat, slow to arouse  Assessment/Plan: 1. 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 Physiatrist is providing close team supervision and 24 hour management of active medical problems listed below. Physiatrist and rehab team continue to assess barriers to discharge/monitor patient progress toward functional and medical goals. FIM: FIM - Bathing Bathing Steps Patient Completed: Chest, Right Arm, Left Arm, Abdomen, Front perineal area, Right upper leg, Left upper leg, Buttocks Bathing: 4: Min-Patient completes 8-9 78f 10 parts or 75+ percent  FIM - Upper Body Dressing/Undressing Upper body dressing/undressing steps patient completed: Thread/unthread right sleeve of pullover shirt/dresss, Thread/unthread left sleeve of pullover shirt/dress, Put head through opening of pull over shirt/dress, Pull shirt over trunk Upper body dressing/undressing: 6: Assistive device (Comment) (seated in STEDY) FIM - Lower Body Dressing/Undressing Lower body dressing/undressing steps patient completed: Thread/unthread right pants leg, Thread/unthread left pants leg, Don/Doff right sock, Don/Doff left sock Lower body dressing/undressing: 3: Mod-Patient completed 50-74% of tasks  FIM - Toileting Toileting steps completed by patient: Adjust clothing prior to toileting Toileting Assistive Devices: Grab bar or rail for support Toileting: 0: Activity did not occur  FIM - Radio producer Devices: Bedside commode, Grab bars Toilet Transfers: 0-Activity did not occur  FIM - Control and instrumentation engineer Devices: Arm rests, Copy: 3: Bed > Chair or W/C: Mod A (lift or lower assist), 3: Chair  or W/C > Bed: Mod A (lift or lower assist)  FIM - Locomotion: Wheelchair Distance:  150 Locomotion: Wheelchair: 5: Travels 150 ft or more: maneuvers on rugs and over door sills with supervision, cueing or coaxing FIM - Locomotion: Ambulation Locomotion: Ambulation Assistive Devices: Administrator Ambulation/Gait Assistance: 3: Mod assist, 1: +2 Total assist (+2 for wheelchair follow) Locomotion: Ambulation: 0: Activity did not occur  Comprehension Comprehension Mode: Auditory Comprehension: 5-Follows basic conversation/direction: With no assist  Expression Expression Mode: Verbal Expression: 5-Expresses complex 90% of the time/cues < 10% of the time  Social Interaction Social Interaction: 5-Interacts appropriately 90% of the time - Needs monitoring or encouragement for participation or interaction.  Problem Solving Problem Solving: 5-Solves basic 90% of the time/requires cueing < 10% of the time  Memory Memory: 4-Recognizes or recalls 75 - 89% of the time/requires cueing 10 - 24% of the time  Medical Problem List and Plan: 1. Functional deficits secondary to Guillain Barre Syndrome (vs CIDP) in patient with chronic peipheral neuropathy 2. DVT Prophylaxis/Anticoagulation: Pharmaceutical: Lovenox 3. Chronic Pain Management: Was on nucynta 100 mg tid PTA.  -substitute oxycodone for nucynta (which isn't on formulary) -lyrica and cymbalta 4. Anxiety/depression/Mood: Continue Cymbalta for mood stabilization. Will continue ativan po prn for anxiety. LCSW to follow for evaluation and support.   -resumed concerta (lower than his presumed home dose for now) 5. Neuropsych: This patient is capable of making decisions on his own behalf. 6. Skin/Wound Care: Routine pressure relief measures 7. Fluids/Electrolytes/Nutrition: Monitor I/O. BMET within normal limits today 8. HTN: Blood pressure controlled off medications.  9. Peripheral neuropathy: Continue lyrica bid with Cymbalta.  10. DM type 2:   May need glucotrol resumed as po intake  improves.   -for now sugars are controlled 11. Anemia: hgb up to 11.3  10 OSA: no new issues 11. GERD s/p recent dilatation of esophageal stricture:   -increase pepcid to 40mg  bid 12. C diff colitis: Flagyl through 12/14 --no more loose stools   13. Urinary frequency: somewhat improved  -UTI: 100k E Coli  - keflex for 5 days  -pyridium   LOS (Days) 8 A FACE TO FACE EVALUATION WAS PERFORMED  Vivienne Sangiovanni T 07/19/2014 8:20 AM

## 2014-07-20 ENCOUNTER — Inpatient Hospital Stay (HOSPITAL_COMMUNITY): Payer: BC Managed Care – PPO

## 2014-07-20 ENCOUNTER — Encounter (HOSPITAL_COMMUNITY): Payer: BC Managed Care – PPO | Admitting: Occupational Therapy

## 2014-07-20 LAB — GLUCOSE, CAPILLARY
GLUCOSE-CAPILLARY: 93 mg/dL (ref 70–99)
GLUCOSE-CAPILLARY: 92 mg/dL (ref 70–99)
GLUCOSE-CAPILLARY: 112 mg/dL — AB (ref 70–99)
Glucose-Capillary: 129 mg/dL — ABNORMAL HIGH (ref 70–99)

## 2014-07-20 NOTE — Progress Notes (Signed)
[place] PHYSICAL MEDICINE & REHABILITATION     PROGRESS NOTE    Subjective/Complaints: Complains of pain along his left teeth and mouth. A  review of systems has been performed and if not noted above is otherwise negative.   Objective: Vital Signs: Blood pressure 147/66, pulse 79, temperature 98.6 F (37 C), temperature source Oral, resp. rate 18, weight 82.3 kg (181 lb 7 oz), SpO2 96 %. No results found. No results for input(s): WBC, HGB, HCT, PLT in the last 72 hours. No results for input(s): NA, K, CL, GLUCOSE, BUN, CREATININE, CALCIUM in the last 72 hours.  Invalid input(s): CO CBG (last 3)   Recent Labs  07/19/14 1659 07/19/14 2106 07/20/14 0652  GLUCAP 92 118* 93    Wt Readings from Last 3 Encounters:  07/19/14 82.3 kg (181 lb 7 oz)  07/10/14 81.693 kg (180 lb 1.6 oz)  07/02/14 85.276 kg (188 lb)    Physical Exam:  Constitutional: He is oriented to person, place, and time. He appears well-developed. No distress.  HENT: oral mucosa pink and moist. No breakdown or irrititation. No drainage. A few caries Head: Normocephalic and atraumatic.  Eyes: Conjunctivae and EOM are normal. Pupils are equal, round, and reactive to light.  Neck: No JVD present. No tracheal deviation present. No thyromegaly present.  Cardiovascular: Normal rate and regular rhythm.  Respiratory: No respiratory distress. He has no wheezes. He has no rales. He exhibits no tenderness.  GI: He exhibits no distension. There is no tenderness. There is no rebound and no guarding.  Musculoskeletal: He exhibits no edema or tenderness.  Lymphadenopathy:   He has no cervical adenopathy.  Neurological: He is alert and oriented to person, place, and time.    sensory loss distal greater than proximal in stocking glove distribution, in all 4 limbs, lower more affected than uppers. Motor: 4/5 deltoid, bicep, tricep, hands. LE: 3/5 hf, 3+ ke and 4/5 ankles--not consistent with effort, tended to be anxious.  DTR's 1+ to absent throughout  Skin: Skin is warm.   Psychiatric:  Appropriate and alert  Assessment/Plan: 1. 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 Physiatrist is providing close team supervision and 24 hour management of active medical problems listed below. Physiatrist and rehab team continue to assess barriers to discharge/monitor patient progress toward functional and medical goals. FIM: FIM - Bathing Bathing Steps Patient Completed: Chest, Right Arm, Left Arm, Abdomen, Front perineal area, Right upper leg, Left upper leg, Buttocks, Right lower leg (including foot), Left lower leg (including foot) Bathing: 5: Supervision: Safety issues/verbal cues  FIM - Upper Body Dressing/Undressing Upper body dressing/undressing steps patient completed: Thread/unthread right sleeve of pullover shirt/dresss, Thread/unthread left sleeve of pullover shirt/dress, Put head through opening of pull over shirt/dress, Pull shirt over trunk Upper body dressing/undressing: 6: Assistive device (Comment) FIM - Lower Body Dressing/Undressing Lower body dressing/undressing steps patient completed: Thread/unthread right pants leg, Thread/unthread left pants leg Lower body dressing/undressing: 2: Max-Patient completed 25-49% of tasks (sit<>stand from STEDY)  FIM - Toileting Toileting steps completed by patient: Adjust clothing prior to toileting Toileting Assistive Devices: Grab bar or rail for support Toileting: 0: Activity did not occur  FIM - Radio producer Devices: Bedside commode, Grab bars Toilet Transfers: 0-Activity did not occur  FIM - Control and instrumentation engineer Devices: Arm rests Bed/Chair Transfer: 4: Supine > Sit: Min A (steadying Pt. > 75%/lift 1  leg), 4: Sit > Supine: Min A (steadying pt. > 75%/lift 1 leg), 4: Bed > Chair or W/C:  Min A (steadying Pt. > 75%), 4: Chair or W/C > Bed: Min A (steadying Pt. > 75%)  FIM - Locomotion: Wheelchair Distance: 150 Locomotion: Wheelchair: 5: Travels 150 ft or more: maneuvers on rugs and over door sills with supervision, cueing or coaxing FIM - Locomotion: Ambulation Locomotion: Ambulation Assistive Devices: Administrator Ambulation/Gait Assistance: 3: Mod assist, 1: +2 Total assist (+2 for w/c follow) Locomotion: Ambulation: 1: Travels less than 50 ft with moderate assistance (Pt: 50 - 74%)  Comprehension Comprehension Mode: Auditory Comprehension: 5-Understands complex 90% of the time/Cues < 10% of the time  Expression Expression Mode: Verbal Expression: 5-Expresses complex 90% of the time/cues < 10% of the time  Social Interaction Social Interaction: 5-Interacts appropriately 90% of the time - Needs monitoring or encouragement for participation or interaction.  Problem Solving Problem Solving: 5-Solves basic 90% of the time/requires cueing < 10% of the time  Memory Memory: 5-Recognizes or recalls 90% of the time/requires cueing < 10% of the time  Medical Problem List and Plan: 1. Functional deficits secondary to Guillain Barre Syndrome (vs CIDP) in patient with chronic peipheral neuropathy 2. DVT Prophylaxis/Anticoagulation: Pharmaceutical: Lovenox 3. Chronic Pain Management: Was on nucynta 100 mg tid PTA.  -substitute oxycodone for nucynta (which isn't on formulary) -lyrica and cymbalta 4. Anxiety/depression/Mood: Continue Cymbalta for mood stabilization. Will continue ativan po prn for anxiety. LCSW to follow for evaluation and support.   -resumed concerta (lower than his presumed home dose for now) 5. Neuropsych: This patient is capable of making decisions on his own behalf. 6. Skin/Wound Care: Routine pressure relief measures 7. Fluids/Electrolytes/Nutrition: Monitor I/O. BMET within normal limits today 8. HTN: Blood pressure  controlled off medications.  9. Peripheral neuropathy: Continue lyrica bid with Cymbalta.  10. DM type 2:   May need glucotrol resumed as po intake improves.   -for now sugars are controlled 11. Anemia: hgb up to 11.3  10 OSA: no new issues 11. GERD s/p recent dilatation of esophageal stricture:   -increasde pepcid to 40mg  bid 12. C diff colitis: Flagyl through 12/14 --no more loose stools   13. Urinary frequency: somewhat improved  -UTI: 100k E Coli  - keflex for 5 days  -pyridium   LOS (Days) 9 A FACE TO FACE EVALUATION WAS PERFORMED  SWARTZ,ZACHARY T 07/20/2014 9:29 AM

## 2014-07-20 NOTE — Plan of Care (Signed)
Problem: RH SAFETY Goal: RH STG ADHERE TO SAFETY PRECAUTIONS W/ASSISTANCE/DEVICE STG Adhere to Safety Precautions With Supervision Assistance/Device  Outcome: Progressing Goal: RH STG DECREASED RISK OF FALL WITH ASSISTANCE STG Decreased Risk of Fall With min Assistance.  Outcome: Progressing Goal: RH STG DEMO UNDERSTANDING HOME SAFETY PRECAUTIONS Pt able to verbalize understanding and demonstrate safe mobility by d/c.Min assist w/ transfers .  Outcome: Progressing  Problem: RH PAIN MANAGEMENT Goal: RH STG PAIN MANAGED AT OR BELOW PT'S PAIN GOAL Pain goal is that pain level is less than 3.  Outcome: Progressing

## 2014-07-20 NOTE — Progress Notes (Signed)
Occupational Therapy Session Note  Patient Details  Name: Tou Lac MRN: [id number] Date of Birth: 08/08/1956  Today's Date: 07/20/2014 OT Individual Time: 0730-0830 OT Individual Time Calculation (min): 60 min   Short Term Goals: Week 1:  OT Short Term Goal 1 (Week 1): Pt will complete LB bathing with mod A.  OT Short Term Goal 1 - Progress (Week 1): Met OT Short Term Goal 2 (Week 1): Pt will complete LB dressing with mod A. OT Short Term Goal 2 - Progress (Week 1): Met OT Short Term Goal 3 (Week 1): Pt will complete toilet transfer with mod A.  OT Short Term Goal 3 - Progress (Week 1): Met OT Short Term Goal 4 (Week 1): Pt will complete shower transfer with mod A. OT Short Term Goal 4 - Progress (Week 1): Met OT Short Term Goal 5 (Week 1): Pt will be educated on use of AE for increasing independence in self-feeding. OT Short Term Goal 5 - Progress (Week 1): Met   Week 2:  OT Short Term Goal 1 (Week 2): Patient will complete shower stall transfer without use of STEDY at a consistant mod assist level  OT Short Term Goal 2 (Week 2): Patient will complete toilet transfer without use of STEDY at a consistant mod assist level OT Short Term Goal 3 (Week 2): Patient will be independent with a BUE/core strengthening HEP OT Short Term Goal 4 (Week 2): Patient will perform sit<>stands using RW in order to pull up pants  Skilled Therapeutic Interventions/Progress Updates:  Patient received supine in bed eager and willing to work with therapy. Patient with complaints of 8/10 pain in BUEs and BLEs; RN aware. Patient engaged in bed mobility with HOB lowered with supervision. Patient then transferred EOB>w/c with steady assist. Patient propelled self into bathroom for shower stall transfer onto tub transfer bench with min assist (squat pivot technique). UB/LB bathing completed in seated position from here, patient taking more than a reasonable amount of time. Used bath mitts and LH sponge for  bathing. Patient dried, transferred back to w/c with min assist and worked on UB/LB dressing in sit<>stand position using STEDY. Focused skilled intervention on functional squat pivot transfers, dynamic sitting balance/tolerance/endurance, functional use of BUEs, overall activity tolerance/endurance. At end of session, left patient seated in w/c with all needs within reach.   Precautions:  Precautions Precautions: Fall Precaution Comments: h/o multiple falls Restrictions Weight Bearing Restrictions: No  See FIM for current functional status  Therapy/Group: Individual Therapy  , 07/20/2014, 8:46 AM  

## 2014-07-20 NOTE — Plan of Care (Signed)
Problem: RH SAFETY Goal: RH STG DECREASED RISK OF FALL WITH ASSISTANCE STG Decreased Risk of Fall With min Assistance.  Outcome: Progressing Goal: RH STG DEMO UNDERSTANDING HOME SAFETY PRECAUTIONS Pt able to verbalize understanding and demonstrate safe mobility by d/c.Min assist w/ transfers .  Outcome: Progressing

## 2014-07-20 NOTE — Patient Care Conference (Signed)
Inpatient RehabilitationTeam Conference and Plan of Care Update Date: 07/18/2014   Time: 2:05 PM    Patient Name: Frank [name]      Medical Record Number: [id number]  Date of Birth: 27-Oct-1955 Sex: Male         Room/Bed: 4W19C/4W19C-01 Payor Info: Payor: [place] / Plan: [facility] PPO / Product Type: *No Product type* /    Admitting Diagnosis: GBS  Admit Date/Time:  07/11/2014  7:45 PM Admission Comments: No comment available   Primary Diagnosis:  Guillain Barr syndrome Principal Problem: Guillain Barr syndrome  Patient Active Problem List   Diagnosis Date Noted  . C. difficile colitis 07/11/2014  . Guillain Barr syndrome 07/11/2014  . Acute respiratory failure   . Encounter for orogastric (OG) tube placement   . Acute respiratory acidosis   . History of ETT   . Pain   . Rapidly progressive weakness 06/30/2014  . Acute respiratory failure with hypoxia 06/30/2014  . Protein-calorie malnutrition, severe 06/30/2014  . Ascending paralysis   . Nausea with vomiting   . Back pain, thoracic 06/21/2014  . Nausea and vomiting 06/21/2014  . General weakness 06/21/2014  . Gait disorder 06/21/2014  . Decreased frequency of bowel movements 06/21/2014  . Orthostasis 06/21/2014  . Chronic back pain greater than 3 months duration 06/21/2014  . Dehydration 06/21/2014  . Generalized weakness 06/21/2014  . Skin lesion 05/17/2014  . Low back pain 05/17/2014  . Screen for STD (sexually transmitted disease) 05/17/2014  . Closed low lateral malleolus fracture 03/10/2014  . Acute gouty arthritis 01/12/2014  . Change in bowel habits 08/15/2013  . Hemorrhage of rectum and anus 08/15/2013  . Umbilical hernia 38/75/6433  . Rash 11/25/2010  . Foot pain 11/25/2010  . Preventative health care 11/22/2010  . Uncontrolled type II diabetes mellitus with polyneuropathy 05/20/2010  . MORTON'S NEUROMA 05/20/2010  . PERIPHERAL NEUROPATHY 05/20/2010  . ERECTILE DYSFUNCTION,  ORGANIC 05/20/2010  . PERIPHERAL EDEMA 05/20/2010  . HYPERLIPIDEMIA 10/09/2008  . ADD 10/09/2008  . Essential hypertension 10/09/2008  . ALLERGIC RHINITIS 10/09/2008  . Stricture and stenosis of esophagus 02/02/2008  . COLONIC POLYPS 02/01/2008  . ANXIETY DEPRESSION 02/01/2008  . SLEEP APNEA, OBSTRUCTIVE 02/01/2008  . HEMORRHOIDS 02/01/2008  . GERD 02/01/2008  . HIATAL HERNIA 02/01/2008    Expected Discharge Date: Expected Discharge Date: 07/29/14  Team Members Present: Physician leading conference: Dr. Alger Simons Social Worker Present: Lennart Pall, LCSW Nurse Present: Heather Roberts, RN PT Present: [name], Lorriane Shire, PT OT Present: Salome Spotted, OT;Patricia Lissa Hoard, OT SLP Present: Weston Anna, SLP Other (Discipline and Name): Danne Baxter, RN [facility]) PPS Coordinator present : Daiva Nakayama, RN, CRRN     Current Status/Progress Goal Weekly Team Focus  Medical   gbs, dm poly neuropathy, pain syndrome, c diff  improve exercise tolerance and safety,   pain control, rx uti   Bowel/Bladder   can be incontinent of bladder at times, timed toileting q 3 hours and prn, steady to bathroom or to stand and use urinal. Continent of bowel  continent of bowel and bladder with toileting      Swallow/Nutrition/ Hydration             ADL's   supervision>max assist - patient using STEDY currently for safe and effective sit<>stands and transfers during ADLs  overall sueprvision>mod I  functional transfers without STEDY, ADL retraining without use of STEDY, functional strengthening & endurance, activity tolerance/endurance, functional use & strengthening of BUE$s/hands  Mobility   mod A squat pivot transfers; +2 gait and standing activities; S w/c propulsion  S basic transfes, mod I w/c propulsion, min A car transfer, min A short distance gait  neuro re-ed for balance and motor control, gait, standing, endurance, strengthening, transfers, stairs, w/c mobility   Communication    able to make needs known         Safety/Cognition/ Behavioral Observations  bed alarm, quick release belt in w/c, forgetful, fell this admission  no falls with injury      Pain   c/o generalized pain, managed with oxycodone as needed  3 or less on scale of 1-10      Skin   inner buttocks reddened secondary to MASD, diarrhea  Barrier Cream after each bowel movements and bid       Rehab Goals Patient on target to meet rehab goals: Yes Rehab Goals Revised: none *See Care Plan and progress notes for long and short-term goals.  Barriers to Discharge: baseline balance deficits    Possible Resolutions to Barriers:  improve attention and focus, rx pain    Discharge Planning/Teaching Needs:  Pt's partner is looking for family support for pt while he is at work.  If this is not a possibility, then they may need to look into SNF.  There is some concern about pt's insurance being discontinued if pt/partner do not get the paperwork needed to [employer].  question SSI at home vs return to just oral agent at d/c, if need insulin at home will need education with patient and caregiver   Team Discussion:  Pt with GBS, neuropathy, UTI, c-diff.  MD questioned if pt was overmedicated PTA.  ADHD medication restarted at a more appropriate dosage due to possible overmedication.  Pt's partner feels he has improved a lot, yet it is difficult to predict his course of recovery.  OT is working on breathing techniques to help with ataxic movements and motor planning.  PT reported that transfers fluctuate with the steady.  ST has discontinued due to diet being fine.  Pt is making progress and will have supervision from w/c level goals at d/c.  He does have some stairs to enter home.  Revisions to Treatment Plan:  None   Continued Need for Acute Rehabilitation Level of Care: The patient requires daily medical management by a physician with specialized training in physical medicine and rehabilitation for the  following conditions: Daily direction of a multidisciplinary physical rehabilitation program to ensure safe treatment while eliciting the highest outcome that is of practical value to the patient.: Yes Daily medical management of patient stability for increased activity during participation in an intensive rehabilitation regime.: Yes Daily analysis of laboratory values and/or radiology reports with any subsequent need for medication adjustment of medical intervention for : Neurological problems;Other  Noelie Renfrow, Silvestre Mesi 07/20/2014, 9:47 AM

## 2014-07-20 NOTE — Plan of Care (Signed)
Problem: RH Wheelchair Mobility Goal: LTG Patient will propel w/c in community environment (PT) LTG: Patient will propel wheelchair in community environment, # of feet with assist (PT)  Outcome: Not Applicable Date Met:  07/20/14  Comments:  D/C at this time due to not primary focus of PT POC. 

## 2014-07-20 NOTE — Progress Notes (Signed)
NUTRITION FOLLOW UP  DOCUMENTATION CODES Per approved criteria  -Severe malnutrition in the context of acute illness or injury   INTERVENTION: Continue Resource Breeze po daily, each supplement provides 250 kcal and 9 grams of protein  Continue to reinforce DM diet education during admission.   NUTRITION DIAGNOSIS: Malnutrition related to acute illness as evidenced by 9% weight loss x 1 month and intake <50% of his needs for >/= 5 days; improving  Goal: Pt to meet >/= 90% of their estimated nutrition needs; met  Monitor:  PO intake, supplement acceptance, weight trends  58 y.o. male  Admitting Dx: Frank [name] syndrome  ASSESSMENT: Pt admitted to rehab after inpatient stay for GBS with IVIG treatments.  Pt now with C.Diff. Pt concerned about managing his DM diet at home after discharge. Encouraged pt to focus on protein, explained how CHO modified diet worked. Pt does seem confused about how to modify diet. Pt asking for carrot cake and lemonade. Pt seems to want mostly sweets. Reviewed menu and CHO limits for each meal.   12/10-Pt reports having a good appetite. Meal completion is 100%. Pt reports his Resource Frank [name] is fine. Will continue with current intervention. Pt requests a DM diet education prior to discharge 12/19. RD will give education next visit prior to pt leaving. Pt was encouraged to continue eating his food at meals.   Height: Ht Readings from Last 1 Encounters:  07/06/14 '5\' 9"'  (1.753 m)    Weight: Wt Readings from Last 1 Encounters:  07/19/14 181 lb 7 oz (82.3 kg)    BMI:  Body mass index is 26.78 kg/(m^2).  Re-Estimated Nutritional Needs: Kcal: 2100-2300 Protein: 100-130 grams Fluid: > 2.1 L/day  Skin: non-pitting LE edema  Diet Order: Diet Carb Modified Meal Completion: 100%   Intake/Output Summary (Last 24 hours) at 07/20/14 1413 Last data filed at 07/20/14 1300  Gross per 24 hour  Intake    600 ml  Output   1275 ml  Net   -675 ml     Last BM: 12/8  C.diff positive    Labs:  No results for input(s): NA, K, CL, CO2, BUN, CREATININE, CALCIUM, MG, PHOS, GLUCOSE in the last 168 hours.  CBG (last 3)   Recent Labs  07/19/14 2106 07/20/14 0652 07/20/14 1141  GLUCAP 118* 93 129*    Scheduled Meds: . antiseptic oral rinse  7 mL Mouth Rinse BID  . cephALEXin  500 mg Oral Q12H  . chlorhexidine  15 mL Mouth/Throat BID  . DULoxetine  30 mg Oral Daily  . enoxaparin (LOVENOX) injection  40 mg Subcutaneous Q24H  . famotidine  40 mg Oral BID  . feeding supplement (RESOURCE BREEZE)  1 Container Oral Daily  . insulin aspart  0-9 Units Subcutaneous TID WC  . LORazepam  0.5 mg Oral QHS  . methylphenidate  36 mg Oral Daily  . metroNIDAZOLE  500 mg Oral 3 times per day  . polyethylene glycol  17 g Oral Daily  . pregabalin  200 mg Oral BID    Continuous Infusions:   Past Medical History  Diagnosis Date  . COLONIC POLYPS 02/01/2008  . DIABETES MELLITUS, TYPE II 05/20/2010  . HYPERLIPIDEMIA 10/09/2008  . ANXIETY DEPRESSION 02/01/2008  . ERECTILE DYSFUNCTION 10/09/2008  . ADD 10/09/2008  . SLEEP APNEA, OBSTRUCTIVE 02/01/2008  . MORTON'S NEUROMA 05/20/2010  . PERIPHERAL NEUROPATHY 05/20/2010  . Other specified forms of hearing loss 06/27/2009  . HYPERTENSION 10/09/2008  . HEMORRHOIDS 02/01/2008  . ALLERGIC  RHINITIS 10/09/2008  . Stricture and stenosis of esophagus 02/02/2008  . GERD 02/01/2008  . HIATAL HERNIA 02/01/2008  . ERECTILE DYSFUNCTION, ORGANIC 05/20/2010  . WRIST PAIN, LEFT 12/05/2009  . FOOT PAIN, LEFT 05/20/2010  . PERIPHERAL EDEMA 05/20/2010  . DYSPNEA 03/12/2010  . Abdominal pain, unspecified site 01/19/2009  . Abdominal pain, left lower quadrant 06/06/2010  . Type II or unspecified type diabetes mellitus without mention of complication, uncontrolled 11/14/2010    Past Surgical History  Procedure Laterality Date  . Carpal tunnel release    . Rotator cuff repair    . Esophageal dilation  july 2009  .  Esophagogastroduodenoscopy N/A 06/27/2014    Procedure: ESOPHAGOGASTRODUODENOSCOPY (EGD);  Surgeon: Lafayette Dragon, MD;  Location: [facility];  Service: Endoscopy;  Laterality: N/A;  . Eye surgery      catract surgery on both eyes    Kallie Locks, MS, RD, LDN Pager # [phone] After hours/ weekend pager # [phone]

## 2014-07-20 NOTE — Progress Notes (Signed)
Social Work Patient ID: Frank [name], male   DOB: April 20, 1956, 58 y.o.   MRN: [id number]   CSW spoke with pt on 07-19-14 and his partner, [name], via telephone to update them on the team conference discussion.  Pt was pleased to know he would be leaving the hospital next week.  CSW reminded pt and [name] that therapy team is recommending 24/7 supervision at home and that pt will be able to do most things from the w/c at home, but he is going to need someone with him at all times.  [name] is still checking with family to see if they can piece together a schedule and if not, pt may need to go to a skilled nursing facility again, though not their first preference.  Discharge plan may be complicated by pt needing MD documentation from October to support his inability to work this week or his insurance could be terminated.  This CSW faxed information to [facility], but it may not suffice (documented this in previous progress note).  CSW asked for [name] to keep CSW posted on his progress and we would touch base at the beginning of next week.  CSW to give pt resources for online support for GBS.  Will continue to follow and assist as needed.

## 2014-07-20 NOTE — Progress Notes (Signed)
Occupational Therapy Session Note  Patient Details  Name: Frank [name] MRN: [id number] Date of Birth: [birth year]  Today's Date: 07/20/2014 OT Individual Time: 1300-1405 OT Individual Time Calculation (min): 65 min    Short Term Goals: Week 2:  OT Short Term Goal 1 (Week 2): Patient will complete shower stall transfer without use of STEDY at a consistant mod assist level  OT Short Term Goal 2 (Week 2): Patient will complete toilet transfer without use of STEDY at a consistant mod assist level OT Short Term Goal 3 (Week 2): Patient will be independent with a BUE/core strengthening HEP OT Short Term Goal 4 (Week 2): Patient will perform sit<>stands using RW in order to pull up pants  Skilled Therapeutic Interventions/Progress Updates:    Pt seen for 1:1 OT session with focus on fine and gross motor coordination, functional transfers, and activity tolerance. Pt received sitting in w/c recalling to ask therapist for elastic shoe laces. Pt removed and donned new shoe laces with emphasis on fine motor coordination. Pt completed task with increased time and min assist. Placed beads in theraputty with pt removing all 10 and placing at target. Pt dropped 50% of beads, however able to pick up from table and place in target. Practiced dry tub transfer with TTB and min assist to complete 2x. Pt initially required mod cues for sequencing of transfer then progressed to min cues. Pt returned to room and completed squat pivot transfer w/c>bed. Pt left supine in bed with all needs in reach.   Therapy Documentation Precautions:  Precautions Precautions: Fall Precaution Comments: h/o multiple falls Restrictions Weight Bearing Restrictions: No General:   Vital Signs: Therapy Vitals Temp: 98.4 F (36.9 C) Temp Source: Oral Pulse Rate: 99 Resp: 17 BP: 116/86 mmHg Patient Position (if appropriate): Sitting Oxygen Therapy SpO2: 98 % O2 Device: Not Delivered Pain: Pain Assessment Pain Assessment:  0-10 Pain Score: 8  Pain Type: Acute pain Pain Location: Hand Pain Orientation: Right;Left Pain Descriptors / Indicators: Aching;Tingling Pain Frequency: Intermittent Pain Onset: On-going Patients Stated Pain Goal: 3 Pain Intervention(s): Medication (See eMAR) ADL:   Exercises:   Other Treatments:    See FIM for current functional status  Therapy/Group: Individual Therapy  Duayne Cal 07/20/2014, 2:43 PM

## 2014-07-20 NOTE — Progress Notes (Signed)
Physical Therapy Treatment & Weekly Progress Note  Patient Details  Name: Frank [name] MRN: [id number] Date of Birth: 05-02-1956  Beginning of progress report period: July 12, 2014 End of progress report period: July 20, 2014  Today's Date: 07/20/2014 PT Individual Time: 0930-1030 PT Individual Time Calculation (min): 60 min  Premedicated for pain (generalized per pt report). Pt request to use bathroom prior to leaving for therapy. Assisted pt to toilet using Stedy (steady A for sit to stand from w/c and from toilet or Stedy) with therapist to assist for clothing management. W/c propulsion wit BUE down to therapy gym with overall S; cues for improved efficiency of propulsion. Discussed in detail home set-up and w/c accessibility due to recommendation that pt likely unsafe for home ambulation (or very limited) at d/c and will need to determine accessibility of w/c. Handout given for pt to give to partner tonight to obtain measurements. Discussed having his partner, [name], come in for Fam Ed prior to d/c especially for practicing stairs for home entry and to go through mobility training due to pt planned to leave at S w/c level for PT goals. Pt stated he is not sure if this will be possible due to Mike's work schedule. Will discuss further with CSW especially since pt made it seem that he does have someone to stay with him during the day.   Rest of session focused on neuro re-ed for functional mobility training including gait with RW with min to mod A of therapist and close w/c follow x 25' using green line in hallway for visual feedback (pt does not like using the mirror, but stated the green line was helpful for feedback). Stair negotiation for home entry practice with +2 assist (mod A of 2) up/down 2 steps x 2 reps. First attempt lead up with R foot and down with L foot (L side is weaker) and second attempt lead up with R foot and down with R foot. Therapists felt pt required less A when  actually leading down with RLE. Pt fatigued and felt like knees were "quivering" so did not attempt again. Will continue to address and determine safest method for pt. Reinforced need for family education prior to d/c due to pt's balance, coordination, safety, and mobility impairments.        Patient has met 4 of 4 short term goals. Pt is making good progress while in CIR. Currently can be min to mod A for squat pivot transfers and requires mod to max A for gait with RW short distances with +2 for close w/c follow due to instability. Initiated pt having measurements of household taken to determine w/c accessibility as anticipate pt will be primarily using w/c for mobility until further training with therapy occurs for gait. Discussed need for family education next week prior to scheduled d/c for 12/19.   Patient continues to demonstrate the following deficits: decreased strength, decreased coordination/ataxia, impaired sensation and proprioception, decreased activity tolerance, anxiety with mobility, decreased functional mobility, quadriparesis and therefore will continue to benefit from skilled PT intervention to enhance overall performance with activity tolerance, balance, postural control, ability to compensate for deficits, functional use of  right upper extremity, right lower extremity, left upper extremity and left lower extremity and coordination.  Patient progressing toward long term goals..  Plan of care revisions: discharged w/c community mobility goal due to not primary goal at this time in PT POC.Marland Kitchen Also added goal for controlled environment gait. Will continue to monitor with home environment  goal is appropriate.   PT Short Term Goals Week 1:  PT Short Term Goal 1 (Week 1): Pt will move supine<>sit w/ SBA consistently PT Short Term Goal 1 - Progress (Week 1): Met PT Short Term Goal 2 (Week 1): Pt will propel w/c 100' w/ SBA consistently PT Short Term Goal 2 - Progress (Week 1): Met PT  Short Term Goal 3 (Week 1): Pt will demonstrate appropriate pressure relief techniques with min cueing PT Short Term Goal 3 - Progress (Week 1): Met PT Short Term Goal 4 (Week 1): Pt will direct setup for bed<>w/c transfer w/ min cueing  PT Short Term Goal 4 - Progress (Week 1): Met Week 2:  PT Short Term Goal 1 (Week 2): = LTGs  Skilled Therapeutic Interventions/Progress Updates:  Ambulation/gait training;Balance/vestibular training;Cognitive remediation/compensation;Discharge planning;Disease management/prevention;Neuromuscular re-education;Functional mobility training;Patient/family education;Therapeutic Activities;Therapeutic Exercise;UE/LE Strength taining/ROM;UE/LE Coordination activities;Wheelchair propulsion/positioning;Psychosocial support;DME/adaptive equipment instruction;Pain management;Skin care/wound management;Splinting/orthotics;Stair training;Community reintegration   Therapy Documentation Precautions:  Precautions Precautions: Fall Precaution Comments: h/o multiple falls Restrictions Weight Bearing Restrictions: No  See FIM for current functional status  Therapy/Group: Individual Therapy and Co-Treatment with Recreational Therapy  [name] [facility] 07/20/2014, 7:47 AM

## 2014-07-21 ENCOUNTER — Inpatient Hospital Stay (HOSPITAL_COMMUNITY): Payer: BC Managed Care – PPO

## 2014-07-21 ENCOUNTER — Encounter (HOSPITAL_COMMUNITY): Payer: BC Managed Care – PPO | Admitting: Occupational Therapy

## 2014-07-21 LAB — GLUCOSE, CAPILLARY
GLUCOSE-CAPILLARY: 88 mg/dL (ref 70–99)
Glucose-Capillary: 96 mg/dL (ref 70–99)
Glucose-Capillary: 95 mg/dL (ref 70–99)
Glucose-Capillary: 106 mg/dL — ABNORMAL HIGH (ref 70–99)

## 2014-07-21 NOTE — Progress Notes (Signed)
Physical Therapy Session Note  Patient Details  Name: Frank [name] MRN: [id number] Date of Birth: [birth year]  Today's Date: 07/21/2014 PT Individual Time: 1000-1100 PT Individual Time Calculation (min): 60 min    Skilled Therapeutic Interventions/Progress Updates:   Session focused heavily on neuro re-ed for trunk control and BLE motor control including gait, sit to stands, and stair negotiation. During gait trials (3 x 30') with RW required increased A (mod A) compared to yesterday due to knee buckling, ataxia, and decreased balance with cues for foot placement (using green line in hallway for visual feedback for BOS and step length). Pt requires cues and facilitation for sit to stand and on one attempt pushed too anterior with knees and almost lowered to floor but able to prevent (educated on technique to go up instead of forward when performing sit to stand) as well as focus and cues to reach back to control descent as pt "plops" into w/c uncontrolled. Again discussed recommendations at this time for home will not be at an ambulatory level for safety and plan for [name] (pt's partner) to go through therapy this Sunday with PT in preparation for d/c next week. Pt in agreement that he would not feel safe walking at home but with being somewhat impulsive, need to firmly address this with caregiver.   Steady A squat pivot transfer back to bed and positioned in supine for comfort with bed alarm on.   Therapy Documentation Precautions:  Precautions Precautions: Fall Precaution Comments: h/o multiple falls Restrictions Weight Bearing Restrictions: No  Pain: C/o R ankle pain from h/o ankle fx (ACE wrap is on, but wonder if possible aircast may be beneficial for added support?)   See FIM for current functional status  Therapy/Group: Individual Therapy  [name] [facility] 07/21/2014, 11:26 AM

## 2014-07-21 NOTE — Plan of Care (Signed)
Problem: RH PAIN MANAGEMENT Goal: RH STG PAIN MANAGED AT OR BELOW PT'S PAIN GOAL Pain goal is that pain level is less than 3.  Outcome: Progressing

## 2014-07-21 NOTE — Progress Notes (Addendum)
[place] PHYSICAL MEDICINE & REHABILITATION     PROGRESS NOTE    Subjective/Complaints: Continues to progress, denies pain. Bladder still a bit frequent but better than before A  review of systems has been performed and if not noted above is otherwise negative.   Objective: Vital Signs: Blood pressure 137/67, pulse 77, temperature 97.8 F (36.6 C), temperature source Oral, resp. rate 18, weight 82.3 kg (181 lb 7 oz), SpO2 99 %. No results found. No results for input(s): WBC, HGB, HCT, PLT in the last 72 hours. No results for input(s): NA, K, CL, GLUCOSE, BUN, CREATININE, CALCIUM in the last 72 hours.  Invalid input(s): CO CBG (last 3)   Recent Labs  07/20/14 1615 07/20/14 2107 07/21/14 0648  GLUCAP 92 112* 96    Wt Readings from Last 3 Encounters:  07/19/14 82.3 kg (181 lb 7 oz)  07/10/14 81.693 kg (180 lb 1.6 oz)  07/02/14 85.276 kg (188 lb)    Physical Exam:  Constitutional: He is oriented to person, place, and time. He appears well-developed. No distress.  HENT: oral mucosa pink and moist. No breakdown or irrititation. No drainage. A few caries Head: Normocephalic and atraumatic.  Eyes: Conjunctivae and EOM are normal. Pupils are equal, round, and reactive to light.  Neck: No JVD present. No tracheal deviation present. No thyromegaly present.  Cardiovascular: Normal rate and regular rhythm.  Respiratory: No respiratory distress. He has no wheezes. He has no rales. He exhibits no tenderness.  GI: He exhibits no distension. There is no tenderness. There is no rebound and no guarding.  Musculoskeletal: He exhibits no edema or tenderness.  Lymphadenopathy:   He has no cervical adenopathy.  Neurological: He is alert and oriented to person, place, and time.    sensory loss distal greater than proximal in stocking glove distribution, in all 4 limbs, lower more affected than uppers. Motor: 4/5 deltoid, bicep, tricep, hands. LE: 3+/5 hf, 3+ to 4 ke and 4/5 ankles--not  consistent with effort, tended to be anxious. DTR's 1+ to absent throughout  Skin: Skin is warm.   Psychiatric:  Appropriate and alert  Assessment/Plan: 1. 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 Physiatrist is providing close team supervision and 24 hour management of active medical problems listed below. Physiatrist and rehab team continue to assess barriers to discharge/monitor patient progress toward functional and medical goals. FIM: FIM - Bathing Bathing Steps Patient Completed: Chest, Right Arm, Left Arm, Abdomen, Front perineal area, Right upper leg, Left upper leg, Buttocks, Right lower leg (including foot), Left lower leg (including foot) Bathing: 5: Supervision: Safety issues/verbal cues  FIM - Upper Body Dressing/Undressing Upper body dressing/undressing steps patient completed: Thread/unthread right sleeve of pullover shirt/dresss, Thread/unthread left sleeve of pullover shirt/dress, Put head through opening of pull over shirt/dress, Pull shirt over trunk Upper body dressing/undressing: 6: Assistive device (Comment) FIM - Lower Body Dressing/Undressing Lower body dressing/undressing steps patient completed: Thread/unthread right pants leg, Thread/unthread left pants leg Lower body dressing/undressing: 2: Max-Patient completed 25-49% of tasks (sit<>stand from STEDY)  FIM - Toileting Toileting steps completed by patient: Performs perineal hygiene Toileting Assistive Devices: Grab bar or rail for support Toileting: 2: Max-Patient completed 1 of 3 steps  FIM - Radio producer Devices: Elevated toilet seat (Stedy) Toilet Transfers: 1-Mechanical lift  FIM - Control and instrumentation engineer Devices: Arm rests Bed/Chair Transfer: 4: Supine > Sit: Min A (  steadying Pt. > 75%/lift 1 leg), 4: Sit > Supine: Min A (steadying  pt. > 75%/lift 1 leg), 4: Bed > Chair or W/C: Min A (steadying Pt. > 75%), 4: Chair or W/C > Bed: Min A (steadying Pt. > 75%)  FIM - Locomotion: Wheelchair Distance: 150 Locomotion: Wheelchair: 5: Travels 150 ft or more: maneuvers on rugs and over door sills with supervision, cueing or coaxing FIM - Locomotion: Ambulation Locomotion: Ambulation Assistive Devices: Administrator Ambulation/Gait Assistance: 3: Mod assist, 1: +2 Total assist (+2 for w/c follow) Locomotion: Ambulation: 1: Two helpers  Comprehension Comprehension Mode: Auditory Comprehension: 5-Understands complex 90% of the time/Cues < 10% of the time  Expression Expression Mode: Verbal Expression: 5-Expresses complex 90% of the time/cues < 10% of the time  Social Interaction Social Interaction: 5-Interacts appropriately 90% of the time - Needs monitoring or encouragement for participation or interaction.  Problem Solving Problem Solving: 5-Solves complex 90% of the time/cues < 10% of the time  Memory Memory: 5-Recognizes or recalls 90% of the time/requires cueing < 10% of the time  Medical Problem List and Plan: 1. Functional deficits secondary to Guillain Barre Syndrome (vs CIDP) in patient with chronic peipheral neuropathy 2. DVT Prophylaxis/Anticoagulation: Pharmaceutical: Lovenox 3. Chronic Pain Management: Was on nucynta 100 mg tid PTA.  -substitute oxycodone for nucynta (which isn't on formulary) -lyrica and cymbalta 4. Anxiety/depression/Mood: Continue Cymbalta for mood stabilization. Will continue ativan po prn for anxiety. LCSW to follow for evaluation and support.   -resumed concerta (lower than his presumed home dose for now) 5. Neuropsych: This patient is capable of making decisions on his own behalf. 6. Skin/Wound Care: Routine pressure relief measures 7. Fluids/Electrolytes/Nutrition: Monitor I/O. BMET within normal limits today 8. HTN: Blood pressure controlled off  medications.  9. Peripheral neuropathy: Continue lyrica bid with Cymbalta.  10. DM type 2:   May need glucotrol resumed as po intake improves.   -for now sugars are controlled 11. Anemia: hgb up to 11.3  10 OSA: no new issues 11. GERD s/p recent dilatation of esophageal stricture:   -increasde pepcid to 40mg  bid with improvement 12. C diff colitis: Flagyl through 12/14 --no more loose stools   13. Urinary frequency: somewhat improved  -UTI: 100k E Coli  - keflex completes tomorrow  -discussed trying to ease off of fluid intake after dinner  LOS (Days) 10 A FACE TO FACE EVALUATION WAS PERFORMED  Caryssa Elzey T 07/21/2014 8:13 AM

## 2014-07-21 NOTE — Plan of Care (Signed)
Problem: RH SAFETY Goal: RH STG DEMO UNDERSTANDING HOME SAFETY PRECAUTIONS Pt able to verbalize understanding and demonstrate safe mobility by d/c.Min assist w/ transfers .  Outcome: Progressing

## 2014-07-21 NOTE — Progress Notes (Signed)
Physical Therapy Session Note  Patient Details  Name: Frank [name] MRN: [id number] Date of Birth: Oct 03, 1955  Today's Date: 07/21/2014 PT Individual Time: 1500-1600 PT Individual Time Calculation (min): 60 min   Short Term Goals: Week 2:  PT Short Term Goal 1 (Week 2): = LTGs  Skilled Therapeutic Interventions/Progress Updates:  1:1. Pt received sitting in w/c, ready for therapy. Focus this session on functional endurance, car transfer training and NMR. Emphasis on w/c propulsion 150'x2 to target B UE coordination with overall supervision, pt demonstrating slow but steady pace. Pt practiced multiple t/f sit<>stand with RW from w/c level for emphasis on anterior weight shift, quad control, hand placement and overall timing and seq. Pt req min-mod A for completion of sit<>stand transfers dependent upon level of fatigue. Pt req mod A for safe completion of car t/f via SPT x2 with max step-by-step cues. Pt utilized NuStep to target B UE/LE strength and coordination, pt with fair-good tolerance to 10 min progressively decreasing resistance from 3-1 due to fatigue. Pt left sitting in w/c at end of session w/ all needs in reach.   Therapy Documentation Precautions:  Precautions Precautions: Fall Precaution Comments: h/o multiple falls Restrictions Weight Bearing Restrictions: No  See FIM for current functional status  Therapy/Group: Individual Therapy  Gilmore Laroche 07/21/2014, 4:06 PM

## 2014-07-21 NOTE — Progress Notes (Signed)
Occupational Therapy Session Note  Patient Details  Name: Frank [name] MRN: [id number] Date of Birth: [birth year]  Today's Date: 07/21/2014 OT Individual Time: 8032-1224 OT Individual Time Calculation (min): 60 min   Short Term Goals: Week 1:  OT Short Term Goal 1 (Week 1): Pt will complete LB bathing with mod A.  OT Short Term Goal 1 - Progress (Week 1): Met OT Short Term Goal 2 (Week 1): Pt will complete LB dressing with mod A. OT Short Term Goal 2 - Progress (Week 1): Met OT Short Term Goal 3 (Week 1): Pt will complete toilet transfer with mod A.  OT Short Term Goal 3 - Progress (Week 1): Met OT Short Term Goal 4 (Week 1): Pt will complete shower transfer with mod A. OT Short Term Goal 4 - Progress (Week 1): Met OT Short Term Goal 5 (Week 1): Pt will be educated on use of AE for increasing independence in self-feeding. OT Short Term Goal 5 - Progress (Week 1): Met   Week 2:  OT Short Term Goal 1 (Week 2): Patient will complete shower stall transfer without use of STEDY at a consistant mod assist level  OT Short Term Goal 2 (Week 2): Patient will complete toilet transfer without use of STEDY at a consistant mod assist level OT Short Term Goal 3 (Week 2): Patient will be independent with a BUE/core strengthening HEP OT Short Term Goal 4 (Week 2): Patient will perform sit<>stands using RW in order to pull up pants  Skilled Therapeutic Interventions/Progress Updates:  Patient received seated in w/c excited for therapy. Patient with 8/10 complaints of pain in BUEs and BLEs; RN aware. Patient propelled to BR for shower stall transfer onto tub transfer bench with min assist and stood using grab bars in shower to doff brief. Patient then completed UB/LB bathing in seated position on bench. During LB bathing, patients feet slipped in shower causing slow descend to the floor from the bench, therapist assisted fall and patient with no complaints of pain. Therapist immediately gathered assistance  (RN & NT) and patient engaged in floor transfer, making this as therapeutic as possible. Safety portal to be filled out regarding this assisted fall. Once safely in w/c, therapist talked with patient about what happened and problem solved how to prevent this from happening again. Patient then performed UB/LB dressing in sit<>stand position using STEDY for sit<>stand. At end of session, left patient seated in w/c with all needs within reach and quick release belt donned.   Precautions:  Precautions Precautions: Fall Precaution Comments: h/o multiple falls Restrictions Weight Bearing Restrictions: No  See FIM for current functional status  Therapy/Group: Individual Therapy  Jamine Wingate 07/21/2014, 9:37 AM

## 2014-07-21 NOTE — Plan of Care (Signed)
Problem: RH SAFETY Goal: RH STG DECREASED RISK OF FALL WITH ASSISTANCE STG Decreased Risk of Fall With min Assistance.  Outcome: Progressing

## 2014-07-21 NOTE — Progress Notes (Signed)
Chaplain visited with pt after being paged. Pt concerned about "not being able to do what I used to do." Pt looking for word of hope from chaplain. Chaplain provided emotional support and offered prayer. Pt would like follow up with chaplain. Should pt need chaplain before follow up page on-call chaplain.   07/21/14 1600  Clinical Encounter Type  Visited With Patient  Visit Type Initial;Spiritual support  Referral From Nurse  Consult/Referral To Chaplain  Recommendations Follow Up  Spiritual Encounters  Spiritual Needs Emotional;Prayer  Stress Factors  Patient Stress Factors Major life changes;Health changes  Skylin Kennerson, Barbette Hair, Chaplain 07/21/2014 5:00 PM

## 2014-07-21 NOTE — Plan of Care (Signed)
Problem: RH SAFETY Goal: RH STG ADHERE TO SAFETY PRECAUTIONS W/ASSISTANCE/DEVICE STG Adhere to Safety Precautions With Supervision Assistance/Device  Outcome: Progressing

## 2014-07-22 ENCOUNTER — Inpatient Hospital Stay (HOSPITAL_COMMUNITY): Payer: BC Managed Care – PPO | Admitting: Physical Therapy

## 2014-07-22 DIAGNOSIS — R270 Ataxia, unspecified: Secondary | ICD-10-CM

## 2014-07-22 LAB — GLUCOSE, CAPILLARY
GLUCOSE-CAPILLARY: 115 mg/dL — AB (ref 70–99)
Glucose-Capillary: 145 mg/dL — ABNORMAL HIGH (ref 70–99)
Glucose-Capillary: 84 mg/dL (ref 70–99)
Glucose-Capillary: 96 mg/dL (ref 70–99)

## 2014-07-22 MED ORDER — SENNOSIDES-DOCUSATE SODIUM 8.6-50 MG PO TABS
2.0000 | ORAL_TABLET | Freq: Two times a day (BID) | ORAL | Status: DC
  Administered 2014-07-22 – 2014-07-29 (×14): 2 via ORAL
  Filled 2014-07-22 (×14): qty 2

## 2014-07-22 NOTE — Progress Notes (Signed)
[facility] PHYSICAL MEDICINE & REHABILITATION     PROGRESS NOTE    Subjective/Complaints: Now constipated, 2 more days of flagyll for c diff A  review of systems has been performed and if not noted above is otherwise negative.   Objective: Vital Signs: Blood pressure 137/78, pulse 91, temperature 97.9 F (36.6 C), temperature source Oral, resp. rate 18, weight 84.7 kg (186 lb 11.7 oz), SpO2 97 %. No results found. No results for input(s): WBC, HGB, HCT, PLT in the last 72 hours. No results for input(s): NA, K, CL, GLUCOSE, BUN, CREATININE, CALCIUM in the last 72 hours.  Invalid input(s): CO CBG (last 3)   Recent Labs  07/21/14 1624 07/21/14 2044 07/22/14 0708  GLUCAP 88 106* 115*    Wt Readings from Last 3 Encounters:  07/22/14 84.7 kg (186 lb 11.7 oz)  07/10/14 81.693 kg (180 lb 1.6 oz)  07/02/14 85.276 kg (188 lb)    Physical Exam:  Constitutional: He is oriented to person, place, and time. He appears well-developed. No distress.  HENT: oral mucosa pink and moist. No breakdown or irrititation. No drainage. A few caries Head: Normocephalic and atraumatic.  Eyes: Conjunctivae and EOM are normal. Pupils are equal, round, and reactive to light.  Neck: No JVD present. No tracheal deviation present. No thyromegaly present.  Cardiovascular: Normal rate and regular rhythm.  Respiratory: No respiratory distress. He has no wheezes. He has no rales. He exhibits no tenderness.  GI: He exhibits no distension. There is no tenderness. There is no rebound and no guarding.  Musculoskeletal: He exhibits no edema or tenderness.  Lymphadenopathy:   He has no cervical adenopathy.  Neurological: He is alert and oriented to person, place, and time.    sensory loss distal greater than proximal in stocking glove distribution, in all 4 limbs, lower more affected than uppers. Motor: 4/5 deltoid, bicep, tricep, hands. LE: 3+/5 hf, 3+ to 4 ke and 4/5 ankles--not consistent with effort,  tended to be anxious. DTR's 1+ to absent throughout  Skin: Skin is warm.   Psychiatric:  Appropriate and alert  Assessment/Plan: 1. 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 Physiatrist is providing close team supervision and 24 hour management of active medical problems listed below. Physiatrist and rehab team continue to assess barriers to discharge/monitor patient progress toward functional and medical goals. FIM: FIM - Bathing Bathing Steps Patient Completed: Chest, Right Arm, Left Arm, Abdomen, Front perineal area, Right upper leg, Left upper leg, Buttocks, Right lower leg (including foot), Left lower leg (including foot) Bathing: 5: Supervision: Safety issues/verbal cues  FIM - Upper Body Dressing/Undressing Upper body dressing/undressing steps patient completed: Thread/unthread right sleeve of pullover shirt/dresss, Thread/unthread left sleeve of pullover shirt/dress, Put head through opening of pull over shirt/dress, Pull shirt over trunk Upper body dressing/undressing: 6: Assistive device (Comment) FIM - Lower Body Dressing/Undressing Lower body dressing/undressing steps patient completed: Thread/unthread right pants leg, Thread/unthread left pants leg Lower body dressing/undressing: 2: Max-Patient completed 25-49% of tasks (sit<>stand from STEDY)  FIM - Toileting Toileting steps completed by patient: Performs perineal hygiene Toileting Assistive Devices: Grab bar or rail for support Toileting: 2: Max-Patient completed 1 of 3 steps  FIM - Radio producer Devices: Elevated toilet seat Customer service manager) Toilet Transfers: 1-Mechanical lift  FIM - Control and instrumentation engineer Devices: Arm rests, Adult nurse Transfer: 3: Bed > Chair or W/C: Mod A (lift  or lower assist), 3: Chair or W/C > Bed: Mod A (lift or lower  assist)  FIM - Locomotion: Wheelchair Distance: 150 Locomotion: Wheelchair: 5: Travels 150 ft or more: maneuvers on rugs and over door sills with supervision, cueing or coaxing FIM - Locomotion: Ambulation Locomotion: Ambulation Assistive Devices: Administrator Ambulation/Gait Assistance: 3: Mod assist, 1: +2 Total assist Locomotion: Ambulation: 0: Activity did not occur  Comprehension Comprehension Mode: Auditory Comprehension: 5-Understands complex 90% of the time/Cues < 10% of the time  Expression Expression Mode: Verbal Expression: 5-Expresses complex 90% of the time/cues < 10% of the time  Social Interaction Social Interaction: 6-Interacts appropriately with others with medication or extra time (anti-anxiety, antidepressant).  Problem Solving Problem Solving: 6-Solves complex problems: With extra time  Memory Memory: 4-Recognizes or recalls 75 - 89% of the time/requires cueing 10 - 24% of the time  Medical Problem List and Plan: 1. Functional deficits secondary to Guillain Barre Syndrome (vs CIDP) in patient with chronic peipheral neuropathy 2. DVT Prophylaxis/Anticoagulation: Pharmaceutical: Lovenox 3. Chronic Pain Management: Was on nucynta 100 mg tid PTA.  -substitute oxycodone for nucynta (which isn't on formulary) -lyrica and cymbalta 4. Anxiety/depression/Mood: Continue Cymbalta for mood stabilization. Will continue ativan po prn for anxiety. LCSW to follow for evaluation and support.   -resumed concerta (lower than his presumed home dose for now) 5. Neuropsych: This patient is capable of making decisions on his own behalf. 6. Skin/Wound Care: Routine pressure relief measures 7. Fluids/Electrolytes/Nutrition: Monitor I/O. BMET within normal limits today 8. HTN: Blood pressure controlled off medications.  9. Peripheral neuropathy: Continue lyrica bid with Cymbalta.  10. DM type 2:   May need glucotrol resumed as po intake  improves.   -for now sugars are controlled 11. Anemia: hgb up to 11.3  10 OSA: no new issues 11. GERD s/p recent dilatation of esophageal stricture:   -increasde pepcid to 40mg  bid with improvement 12. C diff colitis: Flagyl through 12/14 --no more loose stools  , now constipated add senna 13. Urinary frequency: somewhat improved  -UTI: 100k E Coli  - keflex completes tomorrow  -discussed trying to ease off of fluid intake after dinner  LOS (Days) 11 A FACE TO FACE EVALUATION WAS PERFORMED  KIRSTEINS,ANDREW E 07/22/2014 8:12 AM

## 2014-07-22 NOTE — Progress Notes (Signed)
Physical Therapy Session Note  Patient Details  Name: Frank [name] MRN: [id number] Date of Birth: 08-14-1955  Today's Date: 07/22/2014 PT Individual Time: 4680-3212 PT Individual Time Calculation (min): 31 min   Short Term Goals: Week 2:  PT Short Term Goal 1 (Week 2): = LTGs  Skilled Therapeutic Interventions/Progress Updates:     Pt received seated in w/c; agreeable to therapy. Session focused on functional transfers, gait training, and activity tolerance. Pt required mod A and mod cueing for technique with sit<>stand transfers. Pt performed gait x30' in controlled environment with rolling walker, mod A for stability (+2A for close w/c follow), and verbal cueing to address narrow BOS. Pt performed w/c mobility x150' inside/outside hospital lobby to increase activity tolerance. Session ended in pt room, where pt was left seated in w/c with quick release belt in place for safety and all needs within reach.  Therapy Documentation Precautions:  Precautions Precautions: Fall Precaution Comments: h/o multiple falls Restrictions Weight Bearing Restrictions: No Vital Signs: Therapy Vitals Temp: 97.8 F (36.6 C) Temp Source: Oral Pulse Rate: 93 Resp: 17 BP: 127/86 mmHg Patient Position (if appropriate): Sitting Oxygen Therapy SpO2: 97 % O2 Device: Not Delivered Pain: Pain Assessment Pain Assessment: No/denies pain Locomotion : Ambulation Ambulation/Gait Assistance: 3: Mod assist;1: +2 Total assist (+2A for close w/c follow) Wheelchair Mobility Distance: 150   See FIM for current functional status  Therapy/Group: Individual Therapy  Stefano Gaul 07/22/2014, 4:04 PM

## 2014-07-23 ENCOUNTER — Inpatient Hospital Stay (HOSPITAL_COMMUNITY): Payer: BC Managed Care – PPO

## 2014-07-23 LAB — GLUCOSE, CAPILLARY
GLUCOSE-CAPILLARY: 94 mg/dL (ref 70–99)
Glucose-Capillary: 95 mg/dL (ref 70–99)
Glucose-Capillary: 122 mg/dL — ABNORMAL HIGH (ref 70–99)
Glucose-Capillary: 105 mg/dL — ABNORMAL HIGH (ref 70–99)

## 2014-07-23 NOTE — Progress Notes (Signed)
Physical Therapy Session Note  Patient Details  Name: Frank [name] MRN: [id number] Date of Birth: [birth year]  Today's Date: 07/23/2014 PT Individual Time: 1230-1330 PT Individual Time Calculation (min): 60 min   Short Term Goals: Week 2:  PT Short Term Goal 1 (Week 2): = LTGs  Skilled Therapeutic Interventions/Progress Updates:   [name] to initiate family education this session. Pt's partner, Frank [name] brought measurements from home for doorway measurements and heights of various surfaces. It appears that the current sized w/c will fit in all areas measured (26" wide for w/c). Demonstrated gait with pt right now at mod A level with RW and close w/c follow for safety due to knee's buckling. Pt with uncontrolled descent into w/c after about 15 min due to knee buckling and generalized weakness. Educated on recommendation for no ambulation in the household at this time except for with therapy due to decreased balance and increased physical A needed. Both pt and family in agreement. Practiced transfer to tall bed Frank [name] measure 29" inches on sheet but when therapist raised mat to this height, both thought this was too high and he plans on remeasuring.) Practiced uneven transfer with min to mod A with therapist and demonstrated safe technique to pt's family. Frank [name] practiced transfer with pt to more even height squat pivot technique x 2 reps (initially providing too much A with pt giving appropriate feedback) and education on letting pt initiate first and then providing A as needed. Demonstrated stair negotiation with mod A +2 (for safety) with tech and therapist and correct technique. Also demonstrated w/c bump up 2 steps technique and both pt and caregivers agree that they would prefer a ramp for home entry. Frank [name] plans to ask a friend to build a ramp for home entry at d/c. Will change goal to reflect this once confirmed again. Caregivers declined trying to bump up w/c at this time.  Returned back to room with safety belt on and RN present. Recommend further family education when pt closer to d/c for reviewing techniques as well as car transfer.   Therapy Documentation Precautions:  Precautions Precautions: Fall Precaution Comments: h/o multiple falls Restrictions Weight Bearing Restrictions: No  Pain: C/o feeling nauseous - RN aware. Locomotion : Ambulation Ambulation/Gait Assistance: 3: Mod assist;1: +2 Total assist (close w/c follow +2)   See FIM for current functional status  Therapy/Group: Individual Therapy  [name] 07/23/2014, 2:46 PM

## 2014-07-23 NOTE — Progress Notes (Signed)
[place] PHYSICAL MEDICINE & REHABILITATION     PROGRESS NOTE    Subjective/Complaints:   Pt without new issues overnite  "my hands aren't getting stronger" A  review of systems has been performed and if not noted above is otherwise negative.   Objective: Vital Signs: Blood pressure 124/78, pulse 94, temperature 98 F (36.7 C), temperature source Oral, resp. rate 16, weight 82.9 kg (182 lb 12.2 oz), SpO2 97 %. No results found. No results for input(s): WBC, HGB, HCT, PLT in the last 72 hours. No results for input(s): NA, K, CL, GLUCOSE, BUN, CREATININE, CALCIUM in the last 72 hours.  Invalid input(s): CO CBG (last 3)   Recent Labs  07/22/14 1625 07/22/14 2127 07/23/14 0650  GLUCAP 84 96 95    Wt Readings from Last 3 Encounters:  07/23/14 82.9 kg (182 lb 12.2 oz)  07/10/14 81.693 kg (180 lb 1.6 oz)  07/02/14 85.276 kg (188 lb)    Physical Exam:  Constitutional: He is oriented to person, place, and time. He appears well-developed. No distress.  HENT: oral mucosa pink and moist. No breakdown or irrititation. No drainage. A few caries Head: Normocephalic and atraumatic.  Eyes: Conjunctivae and EOM are normal. Pupils are equal, round, and reactive to light.  Neck: No JVD present. No tracheal deviation present. No thyromegaly present.  Cardiovascular: Normal rate and regular rhythm.  Respiratory: No respiratory distress. He has no wheezes. He has no rales. He exhibits no tenderness.  GI: He exhibits no distension. There is no tenderness. There is no rebound and no guarding.  Musculoskeletal: He exhibits no edema or tenderness.  Lymphadenopathy:   He has no cervical adenopathy.  Neurological: He is alert and oriented to person, place, and time.    sensory loss distal greater than proximal in stocking glove distribution, in all 4 limbs, lower more affected than uppers. Motor: 4/5 deltoid, bicep, tricep, hands. LE: 3+/5 hf, 3+ to 4 ke and 4/5 ankles--not consistent with  effort, tended to be anxious.  Skin: Skin is warm.   Psychiatric:  Appropriate and alert  Assessment/Plan: 1. 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 Physiatrist is providing close team supervision and 24 hour management of active medical problems listed below. Physiatrist and rehab team continue to assess barriers to discharge/monitor patient progress toward functional and medical goals. FIM: FIM - Bathing Bathing Steps Patient Completed: Chest, Right Arm, Left Arm, Abdomen, Front perineal area, Right upper leg, Left upper leg, Buttocks, Right lower leg (including foot), Left lower leg (including foot) Bathing: 5: Supervision: Safety issues/verbal cues  FIM - Upper Body Dressing/Undressing Upper body dressing/undressing steps patient completed: Thread/unthread right sleeve of pullover shirt/dresss, Thread/unthread left sleeve of pullover shirt/dress, Put head through opening of pull over shirt/dress, Pull shirt over trunk Upper body dressing/undressing: 6: Assistive device (Comment) FIM - Lower Body Dressing/Undressing Lower body dressing/undressing steps patient completed: Thread/unthread right pants leg, Thread/unthread left pants leg Lower body dressing/undressing: 2: Max-Patient completed 25-49% of tasks (sit<>stand from STEDY)  FIM - Toileting Toileting steps completed by patient: Performs perineal hygiene Toileting Assistive Devices: Grab bar or rail for support Toileting: 2: Max-Patient completed 1 of 3 steps  FIM - Radio producer Devices: Elevated toilet seat Customer service manager) Toilet Transfers: 1-Mechanical lift  FIM - Control and instrumentation engineer Devices: Arm rests, Adult nurse Transfer: 3: Bed > Chair or W/C: Mod A (lift or lower  assist), 3: Chair or W/C > Bed: Mod A (lift or lower assist)  FIM - Locomotion:  Wheelchair Distance: 150 Locomotion: Wheelchair: 5: Travels 150 ft or more: maneuvers on rugs and over door sills with supervision, cueing or coaxing FIM - Locomotion: Ambulation Locomotion: Ambulation Assistive Devices: Administrator Ambulation/Gait Assistance: 3: Mod assist, 1: +2 Total assist (+2A for close w/c follow) Locomotion: Ambulation: 1: Two helpers  Comprehension Comprehension Mode: Auditory Comprehension: 5-Understands basic 90% of the time/requires cueing < 10% of the time  Expression Expression Mode: Verbal Expression: 5-Expresses complex 90% of the time/cues < 10% of the time  Social Interaction Social Interaction: 6-Interacts appropriately with others with medication or extra time (anti-anxiety, antidepressant).  Problem Solving Problem Solving: 6-Solves complex problems: With extra time  Memory Memory: 4-Recognizes or recalls 75 - 89% of the time/requires cueing 10 - 24% of the time  Medical Problem List and Plan: 1. Functional deficits secondary to Guillain Barre Syndrome (vs CIDP) in patient with chronic peipheral neuropathy 2. DVT Prophylaxis/Anticoagulation: Pharmaceutical: Lovenox 3. Chronic Pain Management: Was on nucynta 100 mg tid PTA.  -substitute oxycodone for nucynta (which isn't on formulary) -lyrica and cymbalta 4. Anxiety/depression/Mood: Continue Cymbalta for mood stabilization. Will continue ativan po prn for anxiety. LCSW to follow for evaluation and support.   -resumed concerta (lower than his presumed home dose for now) 5. Neuropsych: This patient is capable of making decisions on his own behalf. 6. Skin/Wound Care: Routine pressure relief measures 7. Fluids/Electrolytes/Nutrition: Monitor I/O. BMET within normal limits today 8. HTN: Blood pressure controlled off medications.  9. Peripheral neuropathy: Continue lyrica bid with Cymbalta.  10. DM type 2:   May need glucotrol resumed as po intake improves.    -for now sugars are controlled 11. Anemia: hgb up to 11.3  10 OSA: no new issues 11. GERD s/p recent dilatation of esophageal stricture:   -increasde pepcid to 40mg  bid with improvement 12. C diff colitis: Flagyl through 12/14 --no more loose stools  , now constipated add senna 13. Urinary frequency: somewhat improved  -UTI: 100k E Coli  - keflex completed   LOS (Days) 12 A FACE TO FACE EVALUATION WAS PERFORMED  Alysia Penna E 07/23/2014 8:41 AM

## 2014-07-24 ENCOUNTER — Inpatient Hospital Stay (HOSPITAL_COMMUNITY): Payer: BC Managed Care – PPO

## 2014-07-24 ENCOUNTER — Encounter (HOSPITAL_COMMUNITY): Payer: BC Managed Care – PPO

## 2014-07-24 ENCOUNTER — Encounter (HOSPITAL_COMMUNITY): Payer: BC Managed Care – PPO | Admitting: Occupational Therapy

## 2014-07-24 LAB — GLUCOSE, CAPILLARY
GLUCOSE-CAPILLARY: 103 mg/dL — AB (ref 70–99)
GLUCOSE-CAPILLARY: 109 mg/dL — AB (ref 70–99)
Glucose-Capillary: 110 mg/dL — ABNORMAL HIGH (ref 70–99)
Glucose-Capillary: 96 mg/dL (ref 70–99)

## 2014-07-24 MED ORDER — PREGABALIN 50 MG PO CAPS
200.0000 mg | ORAL_CAPSULE | Freq: Three times a day (TID) | ORAL | Status: DC
  Administered 2014-07-24 – 2014-07-29 (×16): 200 mg via ORAL
  Filled 2014-07-24 (×16): qty 4

## 2014-07-24 NOTE — Progress Notes (Signed)
Physical Therapy Session Note  Patient Details  Name: Frank [name] MRN: [id number] Date of Birth: 05/09/1956  Today's Date: 07/24/2014 PT Individual Time: 1300-1340 PT Individual Time Calculation (min): 40 min   Short Term Goals: Week 2:  PT Short Term Goal 1 (Week 2): = LTGs  Skilled Therapeutic Interventions/Progress Updates:   Pt up in w/c and reporting feeling worn out from earlier session but agreeable to attempt therapy. W/c propulsion in hallway down to therapy gym with extra time and S with pt stating he felt terrible and his stomach was hurting and it was hard to breathe. Took vitals (see blow) and O2 sats at 100% on room air. Cues for pursed lip breathing to calm patient as he was becoming more anxious and worked up. Returned to room and notified RN of worsening anxiety symptoms and medication given. Mod A for squat pivot transfer back to bed (second person present for safety due to pt's increased anxiety and reports of feeling so "weak and tingly." Positioned in supine and elevated head of bed. Cold wash cloth to forehead for comfort and encouraged pt to attempt deep breathing and relaxation techniques. RN presen and notified PA Pam Love of pt's symptoms.Vitals taken again but due to increased shaking of BUE and BLE uncertain that reading was accurate but RN aware. BP = 149/97 mmHg and O2 = 83% though pulse ox not staying on pt's finger due to increased shaking. Left pt in bed with bed alarm on and RN at bedside. Missed 20 min of skilled PT.   Therapy Documentation Precautions:  Precautions Precautions: Fall Precaution Comments: h/o multiple falls Restrictions Weight Bearing Restrictions: No General: PT Amount of Missed Time (min): 20 Minutes PT Missed Treatment Reason: Other (Comment) (panic attack) Vital Signs: Therapy Vitals Pulse Rate: (!) 111 BP: 117/74 mmHg Patient Position (if appropriate): Sitting Pain: Pain Assessment Pain Assessment: 0-10 Pain Score: 8  Pain  Type: Chronic pain Pain Location: Generalized Pain Descriptors / Indicators: Numbness;Tingling Pain Frequency: Constant Pain Onset: On-going Patients Stated Pain Goal: 3 Pain Intervention(s): Medication (See eMAR)  See FIM for current functional status  Therapy/Group: Individual Therapy  [name] [facility] 07/24/2014, 1:45 PM

## 2014-07-24 NOTE — Progress Notes (Addendum)
[facility] PHYSICAL MEDICINE & REHABILITATION     PROGRESS NOTE    Subjective/Complaints:   Complains of increased pain bilaterally from shoulders to hands. ?bursitis----when queried patient states it's more of a burning tingling pain. A  review of systems has been performed and if not noted above is otherwise negative.   Objective: Vital Signs: Blood pressure 152/91, pulse 79, temperature 98.4 F (36.9 C), temperature source Oral, resp. rate 18, weight 82.9 kg (182 lb 12.2 oz), SpO2 95 %. No results found. No results for input(s): WBC, HGB, HCT, PLT in the last 72 hours. No results for input(s): NA, K, CL, GLUCOSE, BUN, CREATININE, CALCIUM in the last 72 hours.  Invalid input(s): CO CBG (last 3)   Recent Labs  07/23/14 1626 07/23/14 2052 07/24/14 0641  GLUCAP 94 105* 103*    Wt Readings from Last 3 Encounters:  07/23/14 82.9 kg (182 lb 12.2 oz)  07/10/14 81.693 kg (180 lb 1.6 oz)  07/02/14 85.276 kg (188 lb)    Physical Exam:  Constitutional: He is oriented to person, place, and time. He appears well-developed. No distress.  HENT: oral mucosa pink and moist. No breakdown or irrititation. No drainage. A few caries Head: Normocephalic and atraumatic.  Eyes: Conjunctivae and EOM are normal. Pupils are equal, round, and reactive to light.  Neck: No JVD present. No tracheal deviation present. No thyromegaly present.  Cardiovascular: Normal rate and regular rhythm.  Respiratory: No respiratory distress. He has no wheezes. He has no rales. He exhibits no tenderness.  GI: He exhibits no distension. There is no tenderness. There is no rebound and no guarding.  Musculoskeletal: He exhibits no edema or tenderness.  Lymphadenopathy:   He has no cervical adenopathy.  Neurological: He is alert and oriented to person, place, and time.    sensory loss distal greater than proximal in stocking glove distribution, in all 4 limbs, lower more affected than uppers. Motor: 4/5 deltoid,  bicep, tricep, hands. LE: 3+/5 hf, 3+ to 4 ke and 4/5 ankles--not consistent with effort, tended to be anxious.  Skin: Skin is warm.   Psychiatric:  Appropriate and alert  Assessment/Plan: 1. 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 Physiatrist is providing close team supervision and 24 hour management of active medical problems listed below. Physiatrist and rehab team continue to assess barriers to discharge/monitor patient progress toward functional and medical goals. FIM: FIM - Bathing Bathing Steps Patient Completed: Chest, Right Arm, Left Arm, Abdomen, Front perineal area, Right upper leg, Left upper leg, Buttocks, Right lower leg (including foot), Left lower leg (including foot) Bathing: 5: Supervision: Safety issues/verbal cues  FIM - Upper Body Dressing/Undressing Upper body dressing/undressing steps patient completed: Thread/unthread right sleeve of pullover shirt/dresss, Thread/unthread left sleeve of pullover shirt/dress, Put head through opening of pull over shirt/dress, Pull shirt over trunk Upper body dressing/undressing: 6: Assistive device (Comment) FIM - Lower Body Dressing/Undressing Lower body dressing/undressing steps patient completed: Thread/unthread right pants leg, Thread/unthread left pants leg Lower body dressing/undressing: 2: Max-Patient completed 25-49% of tasks (sit<>stand from STEDY)  FIM - Toileting Toileting steps completed by patient: Performs perineal hygiene Toileting Assistive Devices: Grab bar or rail for support Toileting: 2: Max-Patient completed 1 of 3 steps  FIM - Radio producer Devices: Elevated toilet seat Customer service manager) Toilet Transfers: 1-Mechanical lift  FIM - Control and instrumentation engineer Devices: Arm rests, Adult nurse Transfer: 3: Bed >  Chair or W/C: Mod A (lift or lower  assist), 3: Chair or W/C > Bed: Mod A (lift or lower assist)  FIM - Locomotion: Wheelchair Distance: 150 Locomotion: Wheelchair: 5: Travels 150 ft or more: maneuvers on rugs and over door sills with supervision, cueing or coaxing FIM - Locomotion: Ambulation Locomotion: Ambulation Assistive Devices: Administrator Ambulation/Gait Assistance: 3: Mod assist, 1: +2 Total assist (close w/c follow +2) Locomotion: Ambulation: 1: Two helpers  Comprehension Comprehension Mode: Auditory Comprehension: 5-Understands basic 90% of the time/requires cueing < 10% of the time  Expression Expression Mode: Verbal Expression: 5-Expresses basic 90% of the time/requires cueing < 10% of the time.  Social Interaction Social Interaction: 6-Interacts appropriately with others with medication or extra time (anti-anxiety, antidepressant).  Problem Solving Problem Solving: 6-Solves complex problems: With extra time  Memory Memory: 4-Recognizes or recalls 75 - 89% of the time/requires cueing 10 - 24% of the time  Medical Problem List and Plan: 1. Functional deficits secondary to Guillain Barre Syndrome (vs CIDP) in patient with chronic peipheral neuropathy 2. DVT Prophylaxis/Anticoagulation: Pharmaceutical: Lovenox 3. Chronic Pain Management: Was on nucynta 100 mg tid PTA.  -substitute oxycodone for nucynta (which isn'[name] on formulary) -lyrica and cymbalta 4. Anxiety/depression/Mood: Continue Cymbalta for mood stabilization. Will continue ativan po prn for anxiety. LCSW to follow for evaluation and support.   -resumed concerta (lower than his presumed home dose for now) 5. Neuropsych: This patient is capable of making decisions on his own behalf. 6. Skin/Wound Care: Routine pressure relief measures 7. Fluids/Electrolytes/Nutrition: Monitor I/O. BMET within normal limits today 8. HTN: Blood pressure controlled off medications.  9. Peripheral neuropathy: Continue   Cymbalta.    -increase lyrica to tid 10. DM type 2:   May need glucotrol resumed as po intake improves.   -for now sugars are controlled 11. Anemia: hgb up to 11.3  10 OSA: no new issues 11. GERD s/p recent dilatation of esophageal stricture:   -increased pepcid to 40mg  bid with improvement 12. C diff colitis: Flagyl through 12/13 (14 days completed) --no more loose stools   -check c diff to remove from precautions  13. Urinary frequency: somewhat improved  -UTI: 100k E Coli  - keflex completed   LOS (Days) 13 A FACE TO FACE EVALUATION WAS PERFORMED  Frank [name] 07/24/2014 8:07 AM

## 2014-07-24 NOTE — Progress Notes (Signed)
Occupational Therapy Session Note  Patient Details  Name: Frank [name] MRN: [id number] Date of Birth: [birth year]  Today's Date: 07/24/2014 OT Individual Time: 2924-4628 OT Individual Time Calculation (min): 60 min   Short Term Goals: Week 1:  OT Short Term Goal 1 (Week 1): Pt will complete LB bathing with mod A.  OT Short Term Goal 1 - Progress (Week 1): Met OT Short Term Goal 2 (Week 1): Pt will complete LB dressing with mod A. OT Short Term Goal 2 - Progress (Week 1): Met OT Short Term Goal 3 (Week 1): Pt will complete toilet transfer with mod A.  OT Short Term Goal 3 - Progress (Week 1): Met OT Short Term Goal 4 (Week 1): Pt will complete shower transfer with mod A. OT Short Term Goal 4 - Progress (Week 1): Met OT Short Term Goal 5 (Week 1): Pt will be educated on use of AE for increasing independence in self-feeding. OT Short Term Goal 5 - Progress (Week 1): Met   Week 2:  OT Short Term Goal 1 (Week 2): Patient will complete shower stall transfer without use of STEDY at a consistant mod assist level  OT Short Term Goal 2 (Week 2): Patient will complete toilet transfer without use of STEDY at a consistant mod assist level OT Short Term Goal 3 (Week 2): Patient will be independent with a BUE/core strengthening HEP OT Short Term Goal 4 (Week 2): Patient will perform sit<>stands using RW in order to pull up pants  Skilled Therapeutic Interventions/Progress Updates:  Patient received seated in w/c. Patient with 7/10 complaints throughout [place]; RN aware. Patient propelled self into BR for shower stall transfer (min assist squat pivot transfer). Patient doffed pants by performing lateral leans on tub transfer bench. Therapist took opportunity to educate patient on safest way to perform bathing secondary to slip in shower Friday. Therapist put towel under patient's bilateral feet to help prevent any slipping. Patient completed UB/LB bathing in seated position with supervision from  therapist using Stevens sponge to increase independence. Patient dried, then transferred > w/c with mod assist. Patient completed UB/LB dressing in sit<>stand position using STEDY. Therapist wrapped right foot with ace wrap per patient's request. At end of session, left patient seated in w/c with quick release belt donned, all needs within reach, and RN present in room for medication management.   Precautions:  Precautions Precautions: Fall Precaution Comments: h/o multiple falls Restrictions Weight Bearing Restrictions: No  See FIM for current functional status  Therapy/Group: Individual Therapy  Kailyn Vanderslice 07/24/2014, 9:50 AM

## 2014-07-24 NOTE — Progress Notes (Signed)
Physical Therapy Session Note  Patient Details  Name: Frank [name] MRN: [id number] Date of Birth: 30-Dec-1955  Today's Date: 07/24/2014 PT Individual Time: 1000-1100 PT Individual Time Calculation (min): 60 min   Short Term Goals: Week 2:  PT Short Term Goal 1 (Week 2): = LTGs  Skilled Therapeutic Interventions/Progress Updates:   Session focused on w/c propulsion for general UE strengthening and coordination, floor transfer and what to do in case of fall at home, and neuro re-ed for BLE and trunk motor control, balance, gait, proprioceptive input using weights, and overall endurance. Pt able to gait with RW (with 3 # ankle weights on each LE for proprioceptive input) 32' x 2 with mod A and very close w/c follow. Pt becomes very anxious if w/c is not directly behind pt during mobility and requires encouragement. Sit to stands with focus on eccentric control for stand-sit and technique for appropriate weightshift with min to mod A needed using RW. Dynamic standing balance activity included reaching outside BOS in both direction for horseshoe and to toss at target with overall min A. Stand step transfer to w/c with RW with mod A and weighted RW (5# on each side) to assist with control of AD during mobility and pt reports it also helped. Floor transfer training with max A physically and pt able to recall correct sequence and what to do with S cueing. Returned to room and left up in w/c with safety belt on and all needs in place.   Therapy Documentation Precautions:  Precautions Precautions: Fall Precaution Comments: h/o multiple falls Restrictions Weight Bearing Restrictions: No  Pain: C/o numbness in hands this morning.  See FIM for current functional status  Therapy/Group: Individual Therapy  [name] [facility] 07/24/2014, 11:44 AM

## 2014-07-24 NOTE — Consult Note (Signed)
  INITIAL DIAGNOSTIC EVALUATION - CONFIDENTIAL Frank [name]   MEDICAL NECESSITY:  Scout Gumbs was seen on the [place] [facility] for an initial diagnostic evaluation owing to the patient's diagnosis of Guillain-Barre syndrome  According to medical records, Frank [name] was admitted to the rehab unit owing to "Functional deficits secondary to Guillain-Barre Syndrome (vs CIDP) in patient with chronic peripheral neuropathy." Records also indicate that this is a "58 y.o. male with history of DM type 2 with peripheral neuropathy, anxiety disorder, left ankle fracture 02/2014,  back and shoulder pain with mild lower extremity weakness since Oct 1st. He was evaluated by ED multiple times diagnosis of PNA as well as urinary retention, N/V with decrease in intake. MRI thoracic spine with left paracentral disc extrusion at T7-8 with mild cord flattening but no abnormal cord signal or compression deformity and underwent esophageal dilatation and was discharged to SNF on 11/18. He was readmitted on 06/30/14 with rapidly ascending paralysis and severe pain. Neurology recommended full work up with MRI and LP with showed albuminocytologic dissociation (protein 114, 1 WBC) and felt that this coupled with clinical history/exam was most consistent with a diagnosis of GBS. He was started on IVIG and intubated due to concerns of impending respiratory failure.   During today's visit, Frank [name] denied suffering from any major cognitive difficulties. All of his symptoms are reportedly physical in nature. From an emotional standpoint, he described his current mood as "depressed." He has been treated for depression for several years via Cymbalta that he finds to be effective. He has also previously participated in psychotherapy. Per his report, his present medical situation has worsening his generally stable depressive symptoms. No major anxiety endorsed. He additionally mentioned  that he suffers from longstanding bereavement over the loss of his mother which was ~15 years ago. No adjustment issues regarding this admission endorsed. Suicidal/homicidal ideation, plan or intent was denied. No manic or hypomanic episodes were reported. The patient denied ever experiencing any auditory/visual hallucinations. No major behavioral or personality changes were endorsed.   Mr. Less described the rehab staff as "great." He feels that he is making strides in therapy. No barriers to therapy identified. He has his male partner to support him throughout this endeavor as well as family and friends.   PROCEDURES ADMINISTERED: [1 unit 73220] Diagnostic clinical interview  Review of available records  Behavioral Evaluation: Mr. Sheila was appropriately dressed for season and situation, and he appeared tidy and well-groomed. Normal posture was noted. He was friendly and rapport was easily established. His speech was as expected and he was able to express ideas effectively. His affect was appropriately modulated, though he appeared mildly depressed. Attention and motivation were good.    IMPRESSION: Mr. Kindrick denied suffering from any cognitive difficulties, and there were no blatant signs of cognitive impairment observed. He admitted to feeling mildly depressed in light of his present medical situation. He is happy to be discharging at the end of this week and he feels that his mood will improve upon discharge. I encouraged him to follow-up with a counselor as an outpatient if his mood does not improve. No medication recommendations offered at this time. No formal follow-up with neuropsychology recommended for the remainder of his admission unless requested by the staff or the patient.     DIAGNOSES:  Guillain-Barre syndrome Adjustment disorder with depressed mood   Rutha Bouchard, Psy.D.  Clinical Neuropsychologist

## 2014-07-24 NOTE — Progress Notes (Signed)
Late entry.Marland KitchenMarland KitchenPatient had assisted fall with Chrys Racer, OT on 07/21/14 during bathing and dressing OT session. Patient denied pain, skin intact. No sign of injury noted. Dan Angiulli, PA-C, no new orders received. Continue with plan of care.

## 2014-07-25 ENCOUNTER — Encounter (HOSPITAL_COMMUNITY): Payer: BC Managed Care – PPO | Admitting: Occupational Therapy

## 2014-07-25 ENCOUNTER — Inpatient Hospital Stay (HOSPITAL_COMMUNITY): Payer: BC Managed Care – PPO

## 2014-07-25 ENCOUNTER — Inpatient Hospital Stay (HOSPITAL_COMMUNITY): Payer: BC Managed Care – PPO | Admitting: Physical Therapy

## 2014-07-25 LAB — GLUCOSE, CAPILLARY
GLUCOSE-CAPILLARY: 104 mg/dL — AB (ref 70–99)
GLUCOSE-CAPILLARY: 107 mg/dL — AB (ref 70–99)
Glucose-Capillary: 99 mg/dL (ref 70–99)
Glucose-Capillary: 88 mg/dL (ref 70–99)

## 2014-07-25 MED ORDER — LORAZEPAM 0.5 MG PO TABS
0.5000 mg | ORAL_TABLET | Freq: Two times a day (BID) | ORAL | Status: DC
  Administered 2014-07-25 – 2014-07-29 (×8): 0.5 mg via ORAL
  Filled 2014-07-25 (×8): qty 1

## 2014-07-25 MED ORDER — OXYCODONE HCL 5 MG PO TABS: 15.0000 mg | ORAL_TABLET | ORAL | Status: DC | PRN

## 2014-07-25 MED ORDER — OXYCODONE HCL 5 MG PO TABS
15.0000 mg | ORAL_TABLET | ORAL | Status: AC | PRN
  Administered 2014-07-25 – 2014-07-26 (×2): 15 mg via ORAL
  Filled 2014-07-25 (×3): qty 3

## 2014-07-25 MED ORDER — OXYCODONE HCL 5 MG PO TABS: 15.0000 mg | ORAL_TABLET | Freq: Four times a day (QID) | ORAL | Status: DC | PRN

## 2014-07-25 MED ORDER — LORAZEPAM 0.5 MG PO TABS
0.5000 mg | ORAL_TABLET | Freq: Four times a day (QID) | ORAL | Status: DC | PRN
  Administered 2014-07-25: 0.5 mg via ORAL
  Filled 2014-07-25: qty 1

## 2014-07-25 MED ORDER — OXYCODONE HCL 5 MG PO TABS
15.0000 mg | ORAL_TABLET | Freq: Four times a day (QID) | ORAL | Status: DC | PRN
  Administered 2014-07-26 – 2014-07-29 (×10): 15 mg via ORAL
  Filled 2014-07-25 (×10): qty 3

## 2014-07-25 MED ORDER — DULOXETINE HCL 20 MG PO CPEP
40.0000 mg | ORAL_CAPSULE | Freq: Every day | ORAL | Status: DC
  Filled 2014-07-25: qty 2

## 2014-07-25 MED ORDER — DULOXETINE HCL 30 MG PO CPEP
30.0000 mg | ORAL_CAPSULE | Freq: Every day | ORAL | Status: DC
  Administered 2014-07-26 – 2014-07-29 (×4): 30 mg via ORAL
  Filled 2014-07-25 (×5): qty 1

## 2014-07-25 MED ORDER — LORAZEPAM 0.5 MG PO TABS: 0.5000 mg | ORAL_TABLET | Freq: Every day | ORAL | Status: DC

## 2014-07-25 NOTE — Progress Notes (Signed)
Occupational Therapy Note  Patient Details  Name: Frank [name] MRN: [id number] Date of Birth: [birth year]  Today's Date: 07/25/2014 OT Individual Time: 1300-1330 OT Individual Time Calculation (min): 30 min   Pt denied pain Individual Therapy  Pt engaged in practicing tub bench transfers X 2 - scoot pivot transfer.  Pt able to perform w/c setup appropriately and safely prior to transfer.  Pt rolled to and from ADL apartment with rest breaks X 3.    Leotis Shames [facility] 07/25/2014, 2:47 PM

## 2014-07-25 NOTE — Progress Notes (Signed)
Chaplain initiated follow up with pt. Chaplain met pt partner as he was leaving. Pt told chaplain he wanted to "rededicate" his life to God. Pt also narrated some significant spiritual events in his life. Chaplain offered prayer, emotional support, and affirmation. Chaplain will continue to follow. Page chaplain as needed.    07/25/14 1600  Clinical Encounter Type  Visited With Patient;Family  Visit Type Follow-up;Spiritual support  Recommendations Follow Up  Spiritual Encounters  Spiritual Needs Emotional;Prayer  Stress Factors  Patient Stress Factors Major life changes  Frank [name] 07/25/2014 4:37 PM

## 2014-07-25 NOTE — Progress Notes (Addendum)
Patient with high levels of anxiety with multiple complaints. "Burning all over" Difficulty sleeping. Unable to eat due to taste disruption. Does not want to be in hospital and wants to get home as soon as possible.   Ego support provided to help with anxiety attack. Discussed worsening of neuropathy due to GBS--lyrica to be increased to tid today. Encouraged increasing po intake to help with energy levels--discussed supplements. Focused on participating with therapy to help facilitate discharge to home. Patient would like [place] resumed and discussed his poor tolerance of this at home. Will increase oxycodone to every 4 hours to allow for extra doses with therapy and monitor for tolerance.

## 2014-07-25 NOTE — Progress Notes (Signed)
Physical Therapy Session Note  Patient Details  Name: Frank [name] MRN: [id number] Date of Birth: [birth year]  Today's Date: 07/25/2014 PT Individual Time: 8657-8469 PT Individual Time Calculation (min): 60 min   Short Term Goals: Week 2:  PT Short Term Goal 1 (Week 2): = LTGs  Skilled Therapeutic Interventions/Progress Updates:    PA in room with pt so session began late. PA reports plan for alterations in medication and continued encouragement to participate in therapies with increased anxiety beginning yesterday. Pt attributing anxiety increase due to floor transfer practice in therapy yesterday as well as increased pain and tingling in B hands. Reviewed reasoning of performing this transfer yesterday and pt verbalized understanding and agreement but stated it "is too much for me."  Co-treat with PT clinical specialist this session with emphasis on neuro re-ed for motor control, coordination, balance, and addressing impairments with ataxia and impaired proprioception. Also addressed anxiety throughout session with rest breaks, explanation and demonstration of all tasks before trial, and modifying treatments based on patient response. Cues for deep breathing throughout and by end of session pt reports " feeling better about things."  Neuro re-ed and functional therapeutic activity training including: Initial focus on  sit to stand, standing balance, and attempt with gait with RW and weighted vest for increased proprioceptive input. Pt with significant increase in ataxia in standing though improved initially with addition of weighted vest but when pt attempted to take a step required +2 to assist back into chair to prevent fall. Modified goal and worked on body awareness in space, weightbearing through closed and open chain activities, and functional mobility in quadruped and prone on elbows position. Pt able to work on weightshifting in these positions to progress from static position, to  reaching to target with 1 UE at a time alternating R and L, and then progressing to opposite UE and LE lifting up for core activation, balance, and motor control. Pt able to move fairly well through positional changes on mat with cues and facilitation as needed due to decreased coordination and control of extremities. Performed transfers with min A squat pivot technique and extra time as well as cues for safety and positioning.     Therapy Documentation Precautions:  Precautions Precautions: Fall Precaution Comments: h/o multiple falls Restrictions Weight Bearing Restrictions: No  Pain: Pain Assessment Pain Assessment: 0-10 Pain Score: 10-Worst pain ever Pain Type: Chronic pain Pain Location: Generalized Pain Descriptors / Indicators: Numbness;Tingling Pain Frequency: Intermittent Pain Onset: Gradual Patients Stated Pain Goal: 4 Pain Intervention(s): Medication (See eMAR);Repositioned;Emotional support Multiple Pain Sites: No  See FIM for current functional status  Therapy/Group: Individual Therapy and Co-Treatment with PT  [name] [facility] 07/25/2014, 11:56 AM

## 2014-07-25 NOTE — Progress Notes (Signed)
Occupational Therapy Session Note  Patient Details  Name: Emmette Katt MRN: [id number] Date of Birth: [birth year]  Today's Date: 07/25/2014 OT Individual Time: 0930-1030 OT Individual Time Calculation (min): 60 min   Short Term Goals: Week 1:  OT Short Term Goal 1 (Week 1): Pt will complete LB bathing with mod A.  OT Short Term Goal 1 - Progress (Week 1): Met OT Short Term Goal 2 (Week 1): Pt will complete LB dressing with mod A. OT Short Term Goal 2 - Progress (Week 1): Met OT Short Term Goal 3 (Week 1): Pt will complete toilet transfer with mod A.  OT Short Term Goal 3 - Progress (Week 1): Met OT Short Term Goal 4 (Week 1): Pt will complete shower transfer with mod A. OT Short Term Goal 4 - Progress (Week 1): Met OT Short Term Goal 5 (Week 1): Pt will be educated on use of AE for increasing independence in self-feeding. OT Short Term Goal 5 - Progress (Week 1): Met   Week 2:  OT Short Term Goal 1 (Week 2): Patient will complete shower stall transfer without use of STEDY at a consistant mod assist level  OT Short Term Goal 2 (Week 2): Patient will complete toilet transfer without use of STEDY at a consistant mod assist level OT Short Term Goal 3 (Week 2): Patient will be independent with a BUE/core strengthening HEP OT Short Term Goal 4 (Week 2): Patient will perform sit<>stands using RW in order to pull up pants  Skilled Therapeutic Interventions/Progress Updates:  Patient received supine in bed with increased pain and anxiety this am, RN aware. Patient with decreased endurance and poor breath support when talking, RN made aware of this as well. Patient transferred OOB>w/c with min assist and therapist propelled patient into BR for shower stall transfer onto transfer bench. Patient completed UB/LB bathing in seated position with supervision. Patient continued to talk and sound out of breath during talking, therapist encouraged patient to focus on bathing. Patient transferred out of  shower with min assist and used STEDY for sit<>stand during UB/LB dressing. At end of session, left patient seated in w/c with quick release belt donned, patient with head resting on sink due to increased fatigue/decreased endurance.   Precautions:  Precautions Precautions: Fall Precaution Comments: h/o multiple falls Restrictions Weight Bearing Restrictions: No   See FIM for current functional status  Therapy/Group: Individual Therapy  Damarien Nyman 07/25/2014, 10:51 AM

## 2014-07-25 NOTE — Progress Notes (Signed)
Occupational Therapy Session Note  Patient Details  Name: Frank [name] MRN: [id number] Date of Birth: 1956/05/25  Today's Date: 07/25/2014 OT Individual Time: 1401-1431 OT Individual Time Calculation (min): 30 min    Short Term Goals: Week 2:  OT Short Term Goal 1 (Week 2): Patient will complete shower stall transfer without use of STEDY at a consistant mod assist level  OT Short Term Goal 2 (Week 2): Patient will complete toilet transfer without use of STEDY at a consistant mod assist level OT Short Term Goal 3 (Week 2): Patient will be independent with a BUE/core strengthening HEP OT Short Term Goal 4 (Week 2): Patient will perform sit<>stands using RW in order to pull up pants  Skilled Therapeutic Interventions/Progress Updates:    Pt seen for 1:1 OT session with focus on LB dressing, functional transfers, and safety awareness. Pt received sitting in w/c agreeable to therapy at this time. Completed squat pivot transfer w/c>bed with min cues for setup and min assist for transfer. Discussed options of lateral leans and supine in bed for LB dressing and pt agreeable to trial both. Began supine in bed using bridging and rolling left and right with pt requiring min-mod assist to complete. Practiced using lateral lean technique, requiring min assist to complete and max cues for technique. Pt required frequent rest breaks during task d/t fatigue. At end of session pt left sitting in w/c with all needs in reach.   Therapy Documentation Precautions:  Precautions Precautions: Fall Precaution Comments: h/o multiple falls Restrictions Weight Bearing Restrictions: No General:   Vital Signs:  Pain: Pain Assessment Pain Assessment: 0-10 Pain Score: 8  Pain Type: Chronic pain Pain Location: Generalized Pain Descriptors / Indicators: Numbness;Tingling Pain Frequency: Intermittent Pain Onset: Gradual Patients Stated Pain Goal: 4 Pain Intervention(s): Medication (See  eMAR);Repositioned;Emotional support Multiple Pain Sites: No ADL:   Exercises:   Other Treatments:    See FIM for current functional status  Therapy/Group: Individual Therapy  Duayne Cal 07/25/2014, 2:41 PM

## 2014-07-25 NOTE — Progress Notes (Signed)
Per Mardene Celeste, OT, patient is "not himself today."  Patient had decreased endurance and difficulty catching his breath during the therapy session.  Algis Liming, PA requested at bedside and is in to assess the patient.  Will continue to monitor.

## 2014-07-25 NOTE — Progress Notes (Signed)
[place] PHYSICAL MEDICINE & REHABILITATION     PROGRESS NOTE    Subjective/Complaints:   Complains of increased pain bilaterally from shoulders to hands. ?bursitis----when queried patient states it's more of a burning tingling pain. A  review of systems has been performed and if not noted above is otherwise negative.   Objective: Vital Signs: Blood pressure 146/84, pulse 80, temperature 98.9 F (37.2 C), temperature source Oral, resp. rate 17, weight 82.9 kg (182 lb 12.2 oz), SpO2 94 %. No results found. No results for input(s): WBC, HGB, HCT, PLT in the last 72 hours. No results for input(s): NA, K, CL, GLUCOSE, BUN, CREATININE, CALCIUM in the last 72 hours.  Invalid input(s): CO CBG (last 3)   Recent Labs  07/24/14 1653 07/24/14 2044 07/25/14 0636  GLUCAP 110* 96 99    Wt Readings from Last 3 Encounters:  07/23/14 82.9 kg (182 lb 12.2 oz)  07/10/14 81.693 kg (180 lb 1.6 oz)  07/02/14 85.276 kg (188 lb)    Physical Exam:  Constitutional: He is oriented to person, place, and time. He appears well-developed. No distress.  HENT: oral mucosa pink and moist. No breakdown or irrititation. No drainage. A few caries Head: Normocephalic and atraumatic.  Eyes: Conjunctivae and EOM are normal. Pupils are equal, round, and reactive to light.  Neck: No JVD present. No tracheal deviation present. No thyromegaly present.  Cardiovascular: Normal rate and regular rhythm.  Respiratory: No respiratory distress. He has no wheezes. He has no rales. He exhibits no tenderness.  GI: He exhibits no distension. There is no tenderness. There is no rebound and no guarding.  Musculoskeletal: He exhibits no edema or tenderness.  Lymphadenopathy:   He has no cervical adenopathy.  Neurological: He is alert and oriented to person, place, and time.    sensory loss distal greater than proximal in stocking glove distribution, in all 4 limbs, lower more affected than uppers. Motor: 4/5 deltoid,  bicep, tricep, hands. LE: 3+/5 hf, 3+ to 4 ke and 4/5 ankles--not consistent with effort, tended to be anxious.  Skin: Skin is warm.   Psychiatric:  Appropriate and alert  Assessment/Plan: 1. 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 Physiatrist is providing close team supervision and 24 hour management of active medical problems listed below. Physiatrist and rehab team continue to assess barriers to discharge/monitor patient progress toward functional and medical goals. FIM: FIM - Bathing Bathing Steps Patient Completed: Chest, Right Arm, Left Arm, Abdomen, Front perineal area, Right upper leg, Left upper leg, Buttocks, Right lower leg (including foot), Left lower leg (including foot) Bathing: 5: Supervision: Safety issues/verbal cues  FIM - Upper Body Dressing/Undressing Upper body dressing/undressing steps patient completed: Thread/unthread right sleeve of pullover shirt/dresss, Thread/unthread left sleeve of pullover shirt/dress, Put head through opening of pull over shirt/dress, Pull shirt over trunk Upper body dressing/undressing: 6: Assistive device (Comment) FIM - Lower Body Dressing/Undressing Lower body dressing/undressing steps patient completed: Thread/unthread right pants leg, Thread/unthread left pants leg Lower body dressing/undressing: 2: Max-Patient completed 25-49% of tasks (sit<>stand using STEDY)  FIM - Toileting Toileting steps completed by patient: Performs perineal hygiene Toileting Assistive Devices: Grab bar or rail for support Toileting: 2: Max-Patient completed 1 of 3 steps  FIM - Radio producer Devices: Elevated toilet seat (Stedy) Toilet Transfers: 1-Mechanical lift  FIM - Control and instrumentation engineer Devices: Arm rests, Walker (min A squat pivot;  mod A stand pivot with RW) Bed/Chair Transfer:  4: Chair or W/C > Bed: Min A (steadying Pt. > 75%), 3: Bed > Chair or W/C: Mod A (lift or lower assist)  FIM - Locomotion: Wheelchair Distance: 150 Locomotion: Wheelchair: 5: Travels 150 ft or more: maneuvers on rugs and over door sills with supervision, cueing or coaxing FIM - Locomotion: Ambulation Locomotion: Ambulation Assistive Devices: Administrator Ambulation/Gait Assistance: 3: Mod assist, 1: +2 Total assist (+2 for close w/c follow) Locomotion: Ambulation: 1: Two helpers  Comprehension Comprehension Mode: Auditory Comprehension: 5-Understands complex 90% of the time/Cues < 10% of the time  Expression Expression Mode: Verbal Expression: 5-Expresses basic 90% of the time/requires cueing < 10% of the time.  Social Interaction Social Interaction: 5-Interacts appropriately 90% of the time - Needs monitoring or encouragement for participation or interaction.  Problem Solving Problem Solving: 6-Solves complex problems: With extra time  Memory Memory: 4-Recognizes or recalls 75 - 89% of the time/requires cueing 10 - 24% of the time  Medical Problem List and Plan: 1. Functional deficits secondary to Guillain Barre Syndrome (vs CIDP) in patient with chronic peipheral neuropathy 2. DVT Prophylaxis/Anticoagulation: Pharmaceutical: Lovenox 3. Chronic Pain Management: Was on nucynta 100 mg tid PTA.  -substitute oxycodone for nucynta (which isn't on formulary) -lyrica and cymbalta 4. Anxiety/depression/Mood: Continue Cymbalta for mood stabilization. Will continue ativan po prn for anxiety. LCSW to follow for evaluation and support.   -resumed concerta (lower than his presumed home dose for now) 5. Neuropsych: This patient is capable of making decisions on his own behalf. 6. Skin/Wound Care: Routine pressure relief measures 7. Fluids/Electrolytes/Nutrition: Monitor I/O. BMET within normal limits today 8. HTN: Blood pressure controlled off medications.   9. Peripheral neuropathy: Continue   Cymbalta.   -increased lyrica to tid 10. DM type 2:   May need glucotrol resumed as po intake improves.   -for now sugars are controlled 11. Anemia: hgb up to 11.3  10 OSA: no new issues 11. GERD s/p recent dilatation of esophageal stricture:   -increased pepcid to 40mg  bid with improvement 12. C diff colitis: Flagyl through 12/13 (14 days completed) --no more loose stools   -ID now saying he needs 30 days after treatment to clear from precautions  13. Urinary frequency: somewhat improved  -UTI: 100k E Coli  - keflex completed   LOS (Days) 14 A FACE TO FACE EVALUATION WAS PERFORMED  Frank [name] T 07/25/2014 8:30 AM

## 2014-07-25 NOTE — Plan of Care (Signed)
Problem: RH PAIN MANAGEMENT Goal: RH STG PAIN MANAGED AT OR BELOW PT'S PAIN GOAL Pain goal is that pain level is less than 3.  Outcome: Not Progressing Patient states he is having a difficult time with therapy due to pain.  Algis Liming, PA assessed patient and has adjusted pain medication.  Will continue to monitor.

## 2014-07-26 ENCOUNTER — Ambulatory Visit (HOSPITAL_COMMUNITY): Payer: BC Managed Care – PPO

## 2014-07-26 ENCOUNTER — Inpatient Hospital Stay (HOSPITAL_COMMUNITY): Payer: BC Managed Care – PPO | Admitting: Occupational Therapy

## 2014-07-26 ENCOUNTER — Encounter (HOSPITAL_COMMUNITY): Payer: BC Managed Care – PPO | Admitting: Occupational Therapy

## 2014-07-26 LAB — GLUCOSE, CAPILLARY
GLUCOSE-CAPILLARY: 109 mg/dL — AB (ref 70–99)
Glucose-Capillary: 95 mg/dL (ref 70–99)
Glucose-Capillary: 113 mg/dL — ABNORMAL HIGH (ref 70–99)
Glucose-Capillary: 98 mg/dL (ref 70–99)

## 2014-07-26 NOTE — Progress Notes (Signed)
Physical Therapy Session Note  Patient Details  Name: Frank [name] MRN: [id number] Date of Birth: [birth year]  Today's Date: 07/26/2014 PT Individual Time: 1030-1130 PT Individual Time Calculation (min): 60 min   Short Term Goals: Week 2:  PT Short Term Goal 1 (Week 2): = LTGs  Skilled Therapeutic Interventions/Progress Updates:    Pt received seated in w/c handed off from OT, agreeable to participate in therapy. Session focused on family education, squat pivot transfers, bed mobility, proximal muscle strengthening. Pt's partner Frank [name] present for first part of session and educated on how to assist pt with squat pivot transfers bed<>w/c, setting up w/c for transfers, cues that pt benefits from (slow down, setup correctly), and car transfers. Frank [name] verbalized and demonstrated understanding, noted pt with decreased anxiety today, did not require cueing during transfers to slow down and set up transfer correctly. Pt w/ multiple squat pivot transfers w/c<>mat table, required MinA only once while going uphill (at simulated height of pt's bed), otherwise required steadying/CGA. Of note, pt also performed several uphill transfers at same height with steadying/CGA. Supine on table pt performed x10 bridges w/ isometric hip adduction for increased stabilization and proximal muscle strengthening, as well as x10 lower trunk rotation with emphasis on slow controlled movements for improved motor control. Pt w/ 1 sit<>stand from w/c, attempted to ambulate but unable to maintain balance while attempting to take step forward and required +2 assist to sit back down. Session ended in pt's room, where he was left seated in w/c w/ all needs within reach.   Therapy Documentation Precautions:  Precautions Precautions: Fall Precaution Comments: h/o multiple falls Restrictions Weight Bearing Restrictions: No Pain: Complained of neuropathic pain in B hands during session, repositioned pt's wheelchair axle for  decreased pain and increased efficiency with propulsion  See FIM for current functional status  Therapy/Group: Individual Therapy  Rada Hay  Rada Hay, PT, DPT 07/26/2014, 7:51 AM

## 2014-07-26 NOTE — Progress Notes (Signed)
NUTRITION FOLLOW UP  DOCUMENTATION CODES Per approved criteria  -Severe malnutrition in the context of acute illness or injury   INTERVENTION: Continue Resource Breeze po daily, each supplement provides 250 kcal and 9 grams of protein.  Encourage adequate PO intake.  NUTRITION DIAGNOSIS: Malnutrition related to acute illness as evidenced by 9% weight loss x 1 month and intake <50% of his needs for >/= 5 days; improving  Goal: Pt to meet >/= 90% of their estimated nutrition needs; met  Monitor:  PO intake, supplement acceptance, weight trends  58 y.o. male  Admitting Dx: Sharlyn Bologna syndrome  ASSESSMENT: Pt admitted to rehab after inpatient stay for GBS with IVIG treatments.  Pt now with C.Diff. Pt concerned about managing his DM diet at home after discharge. Encouraged pt to focus on protein, explained how CHO modified diet worked. Pt does seem confused about how to modify diet. Pt asking for carrot cake and lemonade. Pt seems to want mostly sweets. Reviewed menu and CHO limits for each meal.   12/16- Meal completion is 100%. Pt reports however he has been having taste aversions and reports foods do not taste right. Pt has been drinking her [employer]. Pt was educated on a diabetes diet. Pt expressed understanding.    Height: Ht Readings from Last 1 Encounters:  07/06/14 '5\' 9"'  (1.753 m)    Weight: Wt Readings from Last 1 Encounters:  07/23/14 182 lb 12.2 oz (82.9 kg)    BMI:  Body mass index is 26.98 kg/(m^2).  Re-Estimated Nutritional Needs: Kcal: 2100-2300 Protein: 100-130 grams Fluid: > 2.1 L/day  Skin: +1 LLE edema  Diet Order: Diet Carb Modified Meal Completion: 100%   Intake/Output Summary (Last 24 hours) at 07/26/14 1204 Last data filed at 07/26/14 0856  Gross per 24 hour  Intake    720 ml  Output   1350 ml  Net   -630 ml    Last BM: 12/15 C.diff positive    Labs:  No results for input(s): NA, K, CL, CO2, BUN, CREATININE, CALCIUM, MG,  PHOS, GLUCOSE in the last 168 hours.  CBG (last 3)   Recent Labs  07/25/14 1643 07/25/14 2051 07/26/14 0655  GLUCAP 88 107* 95    Scheduled Meds: . antiseptic oral rinse  7 mL Mouth Rinse BID  . chlorhexidine  15 mL Mouth/Throat BID  . DULoxetine  30 mg Oral Daily  . enoxaparin (LOVENOX) injection  40 mg Subcutaneous Q24H  . famotidine  40 mg Oral BID  . feeding supplement (RESOURCE BREEZE)  1 Container Oral Daily  . insulin aspart  0-9 Units Subcutaneous TID WC  . LORazepam  0.5 mg Oral BID  . methylphenidate  36 mg Oral Daily  . polyethylene glycol  17 g Oral Daily  . pregabalin  200 mg Oral TID  . senna-docusate  2 tablet Oral BID    Continuous Infusions:   Past Medical History  Diagnosis Date  . COLONIC POLYPS 02/01/2008  . DIABETES MELLITUS, TYPE II 05/20/2010  . HYPERLIPIDEMIA 10/09/2008  . ANXIETY DEPRESSION 02/01/2008  . ERECTILE DYSFUNCTION 10/09/2008  . ADD 10/09/2008  . SLEEP APNEA, OBSTRUCTIVE 02/01/2008  . MORTON'S NEUROMA 05/20/2010  . PERIPHERAL NEUROPATHY 05/20/2010  . Other specified forms of hearing loss 06/27/2009  . HYPERTENSION 10/09/2008  . HEMORRHOIDS 02/01/2008  . ALLERGIC RHINITIS 10/09/2008  . Stricture and stenosis of esophagus 02/02/2008  . GERD 02/01/2008  . HIATAL HERNIA 02/01/2008  . ERECTILE DYSFUNCTION, ORGANIC 05/20/2010  . WRIST PAIN, LEFT  12/05/2009  . FOOT PAIN, LEFT 05/20/2010  . PERIPHERAL EDEMA 05/20/2010  . DYSPNEA 03/12/2010  . Abdominal pain, unspecified site 01/19/2009  . Abdominal pain, left lower quadrant 06/06/2010  . Type II or unspecified type diabetes mellitus without mention of complication, uncontrolled 11/14/2010    Past Surgical History  Procedure Laterality Date  . Carpal tunnel release    . Rotator cuff repair    . Esophageal dilation  july 2009  . Esophagogastroduodenoscopy N/A 06/27/2014    Procedure: ESOPHAGOGASTRODUODENOSCOPY (EGD);  Surgeon: Lafayette Dragon, MD;  Location: [facility];  Service: Endoscopy;   Laterality: N/A;  . Eye surgery      catract surgery on both eyes    Kallie Locks, MS, RD, LDN Pager # [phone] After hours/ weekend pager # [phone]

## 2014-07-26 NOTE — Progress Notes (Signed)
[place] PHYSICAL MEDICINE & REHABILITATION     PROGRESS NOTE    Subjective/Complaints:   No specific complaints today! A  review of systems has been performed and if not noted above is otherwise negative.   Objective: Vital Signs: Blood pressure 124/64, pulse 76, temperature 98.4 F (36.9 C), temperature source Oral, resp. rate 18, weight 82.9 kg (182 lb 12.2 oz), SpO2 98 %. No results found. No results for input(s): WBC, HGB, HCT, PLT in the last 72 hours. No results for input(s): NA, K, CL, GLUCOSE, BUN, CREATININE, CALCIUM in the last 72 hours.  Invalid input(s): CO CBG (last 3)   Recent Labs  07/25/14 1643 07/25/14 2051 07/26/14 0655  GLUCAP 88 107* 95    Wt Readings from Last 3 Encounters:  07/23/14 82.9 kg (182 lb 12.2 oz)  07/10/14 81.693 kg (180 lb 1.6 oz)  07/02/14 85.276 kg (188 lb)    Physical Exam:  Constitutional: He is oriented to person, place, and time. He appears well-developed. No distress.  HENT: oral mucosa pink and moist. No breakdown or irrititation. No drainage. A few caries Head: Normocephalic and atraumatic.  Eyes: Conjunctivae and EOM are normal. Pupils are equal, round, and reactive to light.  Neck: No JVD present. No tracheal deviation present. No thyromegaly present.  Cardiovascular: Normal rate and regular rhythm.  Respiratory: No respiratory distress. He has no wheezes. He has no rales. He exhibits no tenderness.  GI: He exhibits no distension. There is no tenderness. There is no rebound and no guarding.  Musculoskeletal: He exhibits no edema or tenderness.  Lymphadenopathy:   He has no cervical adenopathy.  Neurological: He is alert and oriented to person, place, and time.    sensory loss distal greater than proximal in stocking glove distribution, in all 4 limbs, lower more affected than uppers. Motor: 4/5 deltoid, bicep, tricep, hands. LE: 3+/5 hf, 3+ to 4 ke and 4/5 ankles--not consistent with effort, tended to be anxious.   Skin: Skin is warm.   Psychiatric:  Appropriate and alert  Assessment/Plan: 1. 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 Physiatrist is providing close team supervision and 24 hour management of active medical problems listed below. Physiatrist and rehab team continue to assess barriers to discharge/monitor patient progress toward functional and medical goals.  Had a discussion with the patient today regarding his anxiety and progress towards goals. I told him that i increased his ativan to help with his anxiety, but that he would need to do his part to work through therapies to help accomplish goals by his discharge date. FIM: FIM - Bathing Bathing Steps Patient Completed: Chest, Right Arm, Left Arm, Abdomen, Front perineal area, Right upper leg, Left upper leg, Buttocks, Right lower leg (including foot), Left lower leg (including foot) Bathing: 5: Supervision: Safety issues/verbal cues  FIM - Upper Body Dressing/Undressing Upper body dressing/undressing steps patient completed: Thread/unthread right sleeve of pullover shirt/dresss, Thread/unthread left sleeve of pullover shirt/dress, Put head through opening of pull over shirt/dress, Pull shirt over trunk Upper body dressing/undressing: 6: Assistive device (Comment) FIM - Lower Body Dressing/Undressing Lower body dressing/undressing steps patient completed: Thread/unthread right pants leg, Thread/unthread left pants leg Lower body dressing/undressing: 2: Max-Patient completed 25-49% of tasks (sit<>stand using STEDY)  FIM - Toileting Toileting steps completed by patient: Performs perineal hygiene Toileting Assistive Devices: Grab bar or rail for support Toileting: 2: Max-Patient completed 1 of 3 steps  FIM - Radio producer Devices: Elevated toilet seat Customer service manager) Toilet Transfers: 1-Mechanical  lift  FIM - Control and instrumentation engineer Devices: Arm rests Bed/Chair Transfer: 4: Supine > Sit: Min A (steadying Pt. > 75%/lift 1 leg), 5: Sit > Supine: Supervision (verbal cues/safety issues), 4: Bed > Chair or W/C: Min A (steadying Pt. > 75%), 4: Chair or W/C > Bed: Min A (steadying Pt. > 75%)  FIM - Locomotion: Wheelchair Distance: 150 Locomotion: Wheelchair: 5: Travels 150 ft or more: maneuvers on rugs and over door sills with supervision, cueing or coaxing FIM - Locomotion: Ambulation Locomotion: Ambulation Assistive Devices: Administrator Ambulation/Gait Assistance: 3: Mod assist, 1: +2 Total assist (+2 for close w/c follow) Locomotion: Ambulation: 1: Two helpers  Comprehension Comprehension Mode: Auditory Comprehension: 5-Understands complex 90% of the time/Cues < 10% of the time  Expression Expression Mode: Verbal Expression: 5-Expresses basic 90% of the time/requires cueing < 10% of the time.  Social Interaction Social Interaction: 6-Interacts appropriately with others with medication or extra time (anti-anxiety, antidepressant).  Problem Solving Problem Solving: 6-Solves complex problems: With extra time  Memory Memory: 4-Recognizes or recalls 75 - 89% of the time/requires cueing 10 - 24% of the time  Medical Problem List and Plan: 1. Functional deficits secondary to Guillain Barre Syndrome (vs CIDP) in patient with chronic peipheral neuropathy 2. DVT Prophylaxis/Anticoagulation: Pharmaceutical: Lovenox 3. Chronic Pain Management: Was on nucynta 100 mg tid PTA.  -substitute oxycodone for nucynta (which isn'[name] on formulary) -lyrica and cymbalta 4. Anxiety/depression/Mood: Continue Cymbalta for mood stabilization. Will continue ativan po prn for anxiety. LCSW to follow for evaluation and support.   -resumed concerta (lower than his presumed home dose for now) 5. Neuropsych: This patient is capable of making decisions on  his own behalf. 6. Skin/Wound Care: Routine pressure relief measures 7. Fluids/Electrolytes/Nutrition: Monitor I/O. BMET within normal limits today 8. HTN: Blood pressure controlled off medications.  9. Peripheral neuropathy: Continue   Cymbalta.   -increased lyrica to tid 10. DM type 2:   May need glucotrol resumed as po intake improves.   -for now sugars are controlled 11. Anemia: hgb up to 11.3  10 OSA: no new issues 11. GERD s/p recent dilatation of esophageal stricture:   -increased pepcid to 40mg  bid with improvement 12. C diff colitis: Flagyl through 12/13 (14 days completed) --no more loose stools   -ID now saying he needs 30 days after treatment to clear from precautions  13. Urinary frequency: somewhat improved  -UTI: 100k E Coli  - keflex completed   LOS (Days) 15 A FACE TO FACE EVALUATION WAS PERFORMED  Frank [name] 07/26/2014 8:28 AM

## 2014-07-26 NOTE — Progress Notes (Signed)
Occupational Therapy Session Notes  Patient Details  Name: Frank [name] MRN: [id number] Date of Birth: 09/26/1955  Today's Date: 07/26/2014 OT Individual Time: 4128-7867 OT Individual Time Calculation (min): 60 min   Short Term Goals: Week 1:  OT Short Term Goal 1 (Week 1): Pt will complete LB bathing with mod A.  OT Short Term Goal 1 - Progress (Week 1): Met OT Short Term Goal 2 (Week 1): Pt will complete LB dressing with mod A. OT Short Term Goal 2 - Progress (Week 1): Met OT Short Term Goal 3 (Week 1): Pt will complete toilet transfer with mod A.  OT Short Term Goal 3 - Progress (Week 1): Met OT Short Term Goal 4 (Week 1): Pt will complete shower transfer with mod A. OT Short Term Goal 4 - Progress (Week 1): Met OT Short Term Goal 5 (Week 1): Pt will be educated on use of AE for increasing independence in self-feeding. OT Short Term Goal 5 - Progress (Week 1): Met   Week 2:  OT Short Term Goal 1 (Week 2): Patient will complete shower stall transfer without use of STEDY at a consistant mod assist level  OT Short Term Goal 2 (Week 2): Patient will complete toilet transfer without use of STEDY at a consistant mod assist level OT Short Term Goal 3 (Week 2): Patient will be independent with a BUE/core strengthening HEP OT Short Term Goal 4 (Week 2): Patient will perform sit<>stands using RW in order to pull up pants  Skilled Therapeutic Interventions/Progress Updates:   Session #1 [id number] - 60 Minutes Individual Therapy Patient with complaints of pain in BUEs and BLEs, no rate given; RN aware Patient received seated in w/c with SW and partner present. Focused skilled intervention on education to patient's partner. Patient performed shower stall transfer, UB/LB bathing, UB/LB dressing with therapist providing education. Also educated patient's partner on overall safety and importance of patient having 24/7 supervision/assistance once discharged home. At end of session, left patient  seated in w/c with partner and PT entering room.   Session #2 [id number] - 45 Minutes Missed 15 minutes due to increased fatigue Patient with complaints of pain in BUEs/hands, RN aware Patient received supine in bed "resting". Patient engaged in bed mobility, sat EOB, and transferred > w/c with steady assistance. Patient washed hands and therapist wiped down equipment as needed/necessary. Patient then propelled self > ADL apartment. In ADL apartment focused on tub/shower transfers on/off tub transfer bench, patient min assist for this. Therapist and patient problem solved most effective and safest way to perform transfers once home. Therapist assisted patient > therapy gym, patient transferred onto therapy mat and engaged in BUE strengthening exercises. During exercises, patient with increased fatigue and unable to follow through and finish exercises. Patient expressed desire/need to go back to room and get back to bed. Therapist assisted patient back to room and patient transferred back to bed, missing 15 minutes of skilled therapy. Notified RN and scheduling team.   Precautions:  Precautions Precautions: Fall Precaution Comments: h/o multiple falls Restrictions Weight Bearing Restrictions: No  See FIM for current functional status  Frank [name] 07/26/2014, 10:47 AM

## 2014-07-26 NOTE — Plan of Care (Signed)
Problem: Food- and Nutrition-Related Knowledge Deficit (NB-1.1) Goal: Nutrition education Formal process to instruct or train a patient/client in a skill or to impart knowledge to help patients/clients voluntarily manage or modify food choices and eating behavior to maintain or improve health. Outcome: Completed/Met Date Met:  07/26/14  RD consulted for nutrition education regarding diabetes.     Lab Results  Component Value Date    HGBA1C 5.9* 06/21/2014    RD provided "Carbohydrate Counting for People with Diabetes" handout from the Academy of Nutrition and Dietetics. Discussed different food groups and their effects on blood sugar, emphasizing carbohydrate-containing foods. Provided list of carbohydrates and recommended serving sizes of common foods.  Discussed importance of controlled and consistent carbohydrate intake throughout the day. Provided examples of ways to balance meals/snacks and encouraged intake of high-fiber, whole grain complex carbohydrates. Encouraged adequate protein intake. Teach back method used.  Expect good compliance.  Kallie Locks, MS, RD, LDN Pager # [phone] After hours/ weekend pager # [phone]

## 2014-07-27 ENCOUNTER — Inpatient Hospital Stay (HOSPITAL_COMMUNITY): Payer: BC Managed Care – PPO | Admitting: Occupational Therapy

## 2014-07-27 ENCOUNTER — Encounter (HOSPITAL_COMMUNITY): Payer: BC Managed Care – PPO | Admitting: Occupational Therapy

## 2014-07-27 ENCOUNTER — Inpatient Hospital Stay (HOSPITAL_COMMUNITY): Payer: BC Managed Care – PPO

## 2014-07-27 LAB — GLUCOSE, CAPILLARY
GLUCOSE-CAPILLARY: 85 mg/dL (ref 70–99)
Glucose-Capillary: 88 mg/dL (ref 70–99)
Glucose-Capillary: 159 mg/dL — ABNORMAL HIGH (ref 70–99)
Glucose-Capillary: 115 mg/dL — ABNORMAL HIGH (ref 70–99)

## 2014-07-27 NOTE — Patient Care Conference (Signed)
Inpatient RehabilitationTeam Conference and Plan of Care Update Date: 07/27/2014   Time: 2:30 PM    Patient Name: Frank [name]      Medical Record Number: [id number]  Date of Birth: 07/01/1956 Sex: Male         Room/Bed: 4W19C/4W19C-01 Payor Info: Payor: [place] / Plan: [facility] PPO / Product Type: *No Product type* /    Admitting Diagnosis: GBS  Admit Date/Time:  07/11/2014  7:45 PM Admission Comments: No comment available   Primary Diagnosis:  Guillain Barr syndrome Principal Problem: Guillain Barr syndrome  Patient Active Problem List   Diagnosis Date Noted  . C. difficile colitis 07/11/2014  . Guillain Barr syndrome 07/11/2014  . Acute respiratory failure   . Encounter for orogastric (OG) tube placement   . Acute respiratory acidosis   . History of ETT   . Pain   . Rapidly progressive weakness 06/30/2014  . Acute respiratory failure with hypoxia 06/30/2014  . Protein-calorie malnutrition, severe 06/30/2014  . Ascending paralysis   . Nausea with vomiting   . Back pain, thoracic 06/21/2014  . Nausea and vomiting 06/21/2014  . General weakness 06/21/2014  . Gait disorder 06/21/2014  . Decreased frequency of bowel movements 06/21/2014  . Orthostasis 06/21/2014  . Chronic back pain greater than 3 months duration 06/21/2014  . Dehydration 06/21/2014  . Generalized weakness 06/21/2014  . Skin lesion 05/17/2014  . Low back pain 05/17/2014  . Screen for STD (sexually transmitted disease) 05/17/2014  . Closed low lateral malleolus fracture 03/10/2014  . Acute gouty arthritis 01/12/2014  . Change in bowel habits 08/15/2013  . Hemorrhage of rectum and anus 08/15/2013  . Umbilical hernia 64/33/2951  . Rash 11/25/2010  . Foot pain 11/25/2010  . Preventative health care 11/22/2010  . Uncontrolled type II diabetes mellitus with polyneuropathy 05/20/2010  . MORTON'S NEUROMA 05/20/2010  . PERIPHERAL NEUROPATHY 05/20/2010  . ERECTILE DYSFUNCTION,  ORGANIC 05/20/2010  . PERIPHERAL EDEMA 05/20/2010  . HYPERLIPIDEMIA 10/09/2008  . ADD 10/09/2008  . Essential hypertension 10/09/2008  . ALLERGIC RHINITIS 10/09/2008  . Stricture and stenosis of esophagus 02/02/2008  . COLONIC POLYPS 02/01/2008  . ANXIETY DEPRESSION 02/01/2008  . SLEEP APNEA, OBSTRUCTIVE 02/01/2008  . HEMORRHOIDS 02/01/2008  . GERD 02/01/2008  . HIATAL HERNIA 02/01/2008    Expected Discharge Date: Expected Discharge Date: 07/29/14  Team Members Present: Physician leading conference: Dr. Alger Simons Social Worker Present: Lennart Pall, LCSW Nurse Present: Heather Roberts, RN PT Present: [name], Lorriane Shire, PT OT Present: Salome Spotted, OT;Patricia Lissa Hoard, OT SLP Present: Weston Anna, SLP Other (Discipline and Name): Danne Baxter, RN [facility]) PPS Coordinator present : Daiva Nakayama, RN, CRRN     Current Status/Progress Goal Weekly Team Focus  Medical   ongoing anxiety and pain. daily somatic issues  improve anxiety/safety  caregiver ed   Bowel/Bladder   Continent of bowel and bladder  continent of bowel and bladder with toileting  Continue plan of care   Swallow/Nutrition/ Hydration             ADL's   mod I for UB dressing, - close supervision for bathing - supervision for grooming tasks, min-mod assist for functional transfers, mod assist for sit<>stand without use of STEDY  downgraded goals > supervision->min assist level  functional transfers, sit<>stands, ADL retraining, functional use of BUEs, functional strengthening & endurance, activity tolerance/endurance,    Mobility   min A squat pivot transfers; mod A gait with very close w/c follow +2;  S w/c propulsion; mod + 2 stairs  S basic transfes, mod I w/c propulsion, min A car transfer, min A short distance gait  neuro re-ed for balance, coordination and motor control, gait training, endurance, family education, transfers, stairs, strengthening, d/c planning    Communication              Safety/Cognition/ Behavioral Observations            Pain   chronic generalized pain, managed with Oxy IR 15mg . PRN  3 or less on scale of 1-10  Assess pain q 4hr. and medicate as needed   Skin   inner buttocks reddened secondary to MASD  Barrier Cream after each bowel movements and bid  Assess skin q shift    Rehab Goals Patient on target to meet rehab goals: Yes Rehab Goals Revised: some goals have been downgraded  *See Care Plan and progress notes for long and short-term goals.  Barriers to Discharge: anxiety and ongoing neuro deficits    Possible Resolutions to Barriers:  medical mgt, caregiver ed    Discharge Planning/Teaching Needs:  Pt wants to go home, so his partner is lining up people, including family and a Actuary.  If pt does not do well at home, then they may need to explore SNF.  It seems pt's insurance has been secured for now through [facility].  Pt's partner has come in for family education and others will be coming for the rest of the week leading up to d/c.   Team Discussion:  Pt's anxiety is impacting pt's progress in therapy and all therapists are recommending SNF  Pt will need physical assistance if anxiety is still present.  PT is recommending that if pt is truly going home caregivers need to come all day for several days for family education.  MD is planning to schedule ativan to see if this helps pt.    Revisions to Treatment Plan:  None besides the downgraded goals.   Continued Need for Acute Rehabilitation Level of Care: The patient requires daily medical management by a physician with specialized training in physical medicine and rehabilitation for the following conditions: Daily direction of a multidisciplinary physical rehabilitation program to ensure safe treatment while eliciting the highest outcome that is of practical value to the patient.: Yes Daily medical management of patient stability for increased activity during participation in an intensive  rehabilitation regime.: Yes Daily analysis of laboratory values and/or radiology reports with any subsequent need for medication adjustment of medical intervention for : Neurological problems;Other  Corinn Stoltzfus, Silvestre Mesi 07/27/2014, 2:30 PM S

## 2014-07-27 NOTE — Progress Notes (Signed)
Social Work Patient ID: Frank [name], male   DOB: August 19, 1955, 58 y.o.   MRN: [id number]   CSW met with pt and his partner, [name], to update them on team conference and to discuss the d/c plan further.  Pt is wanting to try to go home and see if he can manage with family and sitter to assist.  Pt's anxiety will be a barrier to doing things at home with caregivers.  Pt told CSW that this is not going to be a factor because he feels his anxiety was due to not wanting people to see him in the state he is in and he feels he has let that go now and knows he needs to accept help if he is going to be successful at home.  [name] is working on arranging family and sitter to be with pt when [name] is at work.  CSW tried to confirm this would be 24/7 and [name] was not committing to this, as he is still working on it.  Pt will be open to SNF if things are not going well at home.  CSW stated this is more difficult than placement from the hospital, but it is possible.  Admissions coordinators from [facility] checked in on pt on 07-26-14 and they will be able to assist from home, if need be, as pt was with them prior to readmission to the hospital.  CSW to arrange [facility] and DME for pt in anticipation of d/c on 07-29-14.  He will leave in the afternoon/evening, as pt's ramp is being built on day of d/c.  CSW will continue to follow pt and touch base with [name] to assure 24/7 care is in place.  Pt is not going to meet all supervision goals and will need hands on physical assistance.

## 2014-07-27 NOTE — Progress Notes (Signed)
Chaplain initiated follow up with pt. Chaplain read Bible to pt at pt requests. Chaplain paged away. Page chaplain as needed.    07/27/14 1600  Clinical Encounter Type  Visited With Patient  Visit Type Follow-up;Spiritual support  Spiritual Encounters  Spiritual Needs Emotional;Sacred text  Frank [name] 07/27/2014 4:15 PM

## 2014-07-27 NOTE — Progress Notes (Signed)
Physical Therapy Session Note  Patient Details  Name: Frank [name] MRN: [id number] Date of Birth: 11-Apr-1956  Today's Date: 07/27/2014 PT Individual Time: 1030-1130 PT Individual Time Calculation (min): 60 min   Short Term Goals: Week 2:  PT Short Term Goal 1 (Week 2): = LTGs  Skilled Therapeutic Interventions/Progress Updates:   No family present during this session to perform family education as indicated on the schedule.   Session focused on functional w/c mobility and parts management (increased time and difficulty with coordination to manage these today), basic transfers to elevated surface squat pivot technique with min A to block B knees, attempted multiple sit to stands with RW from mat table with mod to max A needed and heavy mod A to maintain static standing balance (unsafe to attempt gait or pre-gait due to instability in standing), and neuro re-ed on mat table in quadruped, supine, and prone on elbows positions to work on coordination, strength, motor control, and graded movements. Pt demonstrating significant increase in difficulty of performing these transitional movements compared to session on Tuesday and more uncoordination noted.Only able to perform static quadruped position (no weightshifting or unweighting of extremities) with mod A and 2 x uncontrolled descent into prone position. From there focused on prone on elbows position and transitional movements of UE's in this position. In supine worked on bridging and adduction control using football for visual and proprioceptive feedback.   Pt also verbalized awareness to this decline in strength, coordination and balance and notified RN of observation with functional decline this week. This therapist questions if pt is truly ready to d/c home and if he will reach Supervision w/c level goals.   Therapy Documentation Precautions:  Precautions Precautions: Fall Precaution Comments: h/o multiple falls Restrictions Weight Bearing  Restrictions: No  Pain: C/o pain and tingling throughout BLE and BUE - premedicated.   See FIM for current functional status  Therapy/Group: Individual Therapy  [name] [facility] 07/27/2014, 11:52 AM

## 2014-07-27 NOTE — Progress Notes (Signed)
[facility] PHYSICAL MEDICINE & REHABILITATION     PROGRESS NOTE    Subjective/Complaints:  no new issues. Slept well A  review of systems has been performed and if not noted above is otherwise negative.   Objective: Vital Signs: Blood pressure 144/73, pulse 80, temperature 97.8 F (36.6 C), temperature source Oral, resp. rate 18, height 5\' 9"  (1.753 m), weight 77.6 kg (171 lb 1.2 oz), SpO2 99 %. No results found. No results for input(s): WBC, HGB, HCT, PLT in the last 72 hours. No results for input(s): NA, K, CL, GLUCOSE, BUN, CREATININE, CALCIUM in the last 72 hours.  Invalid input(s): CO CBG (last 3)   Recent Labs  07/26/14 1649 07/26/14 2055 07/27/14 0641  GLUCAP 98 109* 85    Wt Readings from Last 3 Encounters:  07/26/14 77.6 kg (171 lb 1.2 oz)  07/10/14 81.693 kg (180 lb 1.6 oz)  07/02/14 85.276 kg (188 lb)    Physical Exam:  Constitutional: He is oriented to person, place, and time. He appears well-developed. No distress.  HENT: oral mucosa pink and moist. No breakdown or irrititation. No drainage. A few caries Head: Normocephalic and atraumatic.  Eyes: Conjunctivae and EOM are normal. Pupils are equal, round, and reactive to light.  Neck: No JVD present. No tracheal deviation present. No thyromegaly present.  Cardiovascular: Normal rate and regular rhythm.  Respiratory: No respiratory distress. He has no wheezes. He has no rales. He exhibits no tenderness.  GI: He exhibits no distension. There is no tenderness. There is no rebound and no guarding.  Musculoskeletal: He exhibits no edema or tenderness.  Lymphadenopathy:   He has no cervical adenopathy.  Neurological: He is alert and oriented to person, place, and time.    sensory loss distal greater than proximal in stocking glove distribution, in all 4 limbs, lower more affected than uppers. Motor: 4/5 deltoid, bicep, tricep, hands. LE: 4/5 hf, 4+  ke and 4+/5 ankles-- .  Skin: Skin is warm.   Psychiatric:   Anxious at times  Assessment/Plan: 1. 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 Physiatrist is providing close team supervision and 24 hour management of active medical problems listed below. Physiatrist and rehab team continue to assess barriers to discharge/monitor patient progress toward functional and medical goals.  Had a discussion with the patient today regarding his anxiety and progress towards goals. I told him that i increased his ativan to help with his anxiety, but that he would need to do his part to work through therapies to help accomplish goals by his discharge date. FIM: FIM - Bathing Bathing Steps Patient Completed: Chest, Right Arm, Left Arm, Abdomen, Front perineal area, Right upper leg, Left upper leg, Buttocks, Right lower leg (including foot), Left lower leg (including foot) Bathing: 5: Supervision: Safety issues/verbal cues  FIM - Upper Body Dressing/Undressing Upper body dressing/undressing steps patient completed: Thread/unthread right sleeve of pullover shirt/dresss, Thread/unthread left sleeve of pullover shirt/dress, Put head through opening of pull over shirt/dress, Pull shirt over trunk Upper body dressing/undressing: 6: Assistive device (Comment) FIM - Lower Body Dressing/Undressing Lower body dressing/undressing steps patient completed: Thread/unthread right pants leg, Thread/unthread left pants leg Lower body dressing/undressing: 2: Max-Patient completed 25-49% of tasks (sit<>stand using STEDY)  FIM - Toileting Toileting steps completed by patient: Performs perineal hygiene Toileting Assistive Devices: Grab bar or rail for support Toileting: 2: Max-Patient completed 1 of 3 steps  FIM -  Radio producer Devices: Elevated toilet seat Toilet Transfers: 1-Mechanical lift  FIM - Ship broker Devices: Arm rests Bed/Chair Transfer: 5: Supine > Sit: Supervision (verbal cues/safety issues), 5: Sit > Supine: Supervision (verbal cues/safety issues), 4: Bed > Chair or W/C: Min A (steadying Pt. > 75%), 4: Chair or W/C > Bed: Min A (steadying Pt. > 75%)  FIM - Locomotion: Wheelchair Distance: 150 Locomotion: Wheelchair: 6: Travels 150 ft or more, turns around, maneuvers to table, bed or toilet, negotiates 3% grade: maneuvers on rugs and over door sills independently FIM - Locomotion: Ambulation Locomotion: Ambulation Assistive Devices: Administrator Ambulation/Gait Assistance: 3: Mod assist, 1: +2 Total assist (+2 for close w/c follow) Locomotion: Ambulation: 0: Activity did not occur  Comprehension Comprehension Mode: Auditory Comprehension: 5-Understands complex 90% of the time/Cues < 10% of the time  Expression Expression Mode: Verbal Expression: 5-Expresses basic 90% of the time/requires cueing < 10% of the time.  Social Interaction Social Interaction: 6-Interacts appropriately with others with medication or extra time (anti-anxiety, antidepressant).  Problem Solving Problem Solving: 6-Solves complex problems: With extra time  Memory Memory: 4-Recognizes or recalls 75 - 89% of the time/requires cueing 10 - 24% of the time  Medical Problem List and Plan: 1. Functional deficits secondary to Guillain Barre Syndrome (vs CIDP) in patient with chronic peipheral neuropathy 2. DVT Prophylaxis/Anticoagulation: Pharmaceutical: Lovenox 3. Chronic Pain Management: Was on nucynta 100 mg tid PTA.  -substitute oxycodone for nucynta (which isn't on formulary) -lyrica and cymbalta 4. Anxiety/depression/Mood: Continue Cymbalta for mood stabilization. Will continue ativan po prn for anxiety. LCSW to follow for evaluation and support.   -resumed concerta (lower than his presumed home dose for now) 5. Neuropsych: This patient is capable of  making decisions on his own behalf. 6. Skin/Wound Care: Routine pressure relief measures 7. Fluids/Electrolytes/Nutrition: Monitor I/O. BMET within normal limits today 8. HTN: Blood pressure controlled off medications.  9. Peripheral neuropathy: Continue   Cymbalta.   -increased lyrica to tid 10. DM type 2:   May need glucotrol resumed as po intake improves.   -for now sugars are controlled 11. Anemia: hgb up to 11.3  10 OSA: no new issues 11. GERD s/p recent dilatation of esophageal stricture:   -increased pepcid to 40mg  bid with improvement 12. C diff colitis: Flagyl through 12/13 (14 days completed) --no more loose stools   -ID now saying he needs 30 days after treatment to clear from precautions  13. Urinary frequency: somewhat improved  -UTI: 100k E Coli  - keflex completed   LOS (Days) 16 A FACE TO FACE EVALUATION WAS PERFORMED  SWARTZ,ZACHARY T 07/27/2014 8:59 AM

## 2014-07-27 NOTE — Progress Notes (Signed)
Patient was being toileted in bathroom by the NT by use of the wheelchair. Recommendations are to use the stedy for all chair to toilet transfers. Educated patient and NT on proper transfer procedures. Patient had no complaints of injury, and skin is intact.Will continue to monitor.

## 2014-07-27 NOTE — Progress Notes (Signed)
Occupational Therapy Session Notes  Patient Details  Name: Frank [name] MRN: [id number] Date of Birth: Jun 02, 1956  Short Term Goals: Week 1:  OT Short Term Goal 1 (Week 1): Pt will complete LB bathing with mod A.  OT Short Term Goal 1 - Progress (Week 1): Met OT Short Term Goal 2 (Week 1): Pt will complete LB dressing with mod A. OT Short Term Goal 2 - Progress (Week 1): Met OT Short Term Goal 3 (Week 1): Pt will complete toilet transfer with mod A.  OT Short Term Goal 3 - Progress (Week 1): Met OT Short Term Goal 4 (Week 1): Pt will complete shower transfer with mod A. OT Short Term Goal 4 - Progress (Week 1): Met OT Short Term Goal 5 (Week 1): Pt will be educated on use of AE for increasing independence in self-feeding. OT Short Term Goal 5 - Progress (Week 1): Met   Week 2:  OT Short Term Goal 1 (Week 2): Patient will complete shower stall transfer without use of STEDY at a consistant mod assist level  OT Short Term Goal 2 (Week 2): Patient will complete toilet transfer without use of STEDY at a consistant mod assist level OT Short Term Goal 3 (Week 2): Patient will be independent with a BUE/core strengthening HEP OT Short Term Goal 4 (Week 2): Patient will perform sit<>stands using RW in order to pull up pants  Skilled Therapeutic Interventions/Progress Updates:   Session #1 0930-1030 - 60 Minutes Individual Therapy Patient with 8/10 complaints of pain in [place], RN aware Patient received seated in w/c ready for therapy. Patient propelled self into BR with min assist and transferred w/c> tub bench with min assist. UB/LB bathing completed in seated position with supervision. From here, patient transferred out of shower > w/c with min assist and completed UB/LB dressing in seated position, performing w/c pushups for therapist to assist with donning of brief and pants. Patient with multiple questions regarding guillain barre, therapist answered questions as much as possible; encouraged  patient to talk with physician regarding other questions or concerns. At end of session, left patient seated in w/c with quick release belt donned and PT entering room for next therapy session.   Session #2 1400-1500 - 60 Minutes Individual Therapy Patient with complaints of pain in bilateral hands, RN aware Patient received seated in w/c. Patient washed hands then therapist wiped down appropriate equipment. From here, patient worked on self propulsion on tile and carpet. Therapist then assisted patient > 1st floor and patient eager to go outside. While outside, patient engaged in [place] strengthening exercises (shoulder flexion/extension, shoulder abduction/adduction, isometric shoulder flexion, and isometric shoulder abduction exercises). Patient propelled around gift shop, then therapist assisted patient back to room. Therapist assisted patient back to bed, min assist for transfer, and left patient supine in bed with all needs within reach.   Precautions:  Precautions Precautions: Fall Precaution Comments: h/o multiple falls Restrictions Weight Bearing Restrictions: No  See FIM for current functional status  Frank [name] 07/27/2014, 7:28 AM

## 2014-07-27 NOTE — Progress Notes (Signed)
Patient verbalized awareness to this RN a decline in functional strength, coordination, and balance. Feels that this week he has become weaker and questions the need for a Neuro consult. Patient also stated that his breathing and voice have become weaker. Night shift RN gave 2L O2 via nasal canula on prior shift without much change in condition. MD please address patient concerns. Patient set for discharge 07/29/14. Will continue with plan of care.

## 2014-07-28 ENCOUNTER — Inpatient Hospital Stay (HOSPITAL_COMMUNITY): Payer: BC Managed Care – PPO | Admitting: Physical Therapy

## 2014-07-28 ENCOUNTER — Inpatient Hospital Stay (HOSPITAL_COMMUNITY): Payer: BC Managed Care – PPO

## 2014-07-28 ENCOUNTER — Inpatient Hospital Stay (HOSPITAL_COMMUNITY): Payer: BC Managed Care – PPO | Admitting: Occupational Therapy

## 2014-07-28 ENCOUNTER — Other Ambulatory Visit: Payer: Self-pay | Admitting: Physical Medicine and Rehabilitation

## 2014-07-28 LAB — BASIC METABOLIC PANEL
Anion gap: 10 (ref 5–15)
BUN: 14 mg/dL (ref 6–23)
CHLORIDE: 98 meq/L (ref 96–112)
CO2: 29 mEq/L (ref 19–32)
CREATININE: 0.8 mg/dL (ref 0.50–1.35)
Calcium: 9.4 mg/dL (ref 8.4–10.5)
GFR calc Af Amer: >90 mL/min (ref >90)
GFR calc non Af Amer: >90 mL/min (ref >90)
Glucose, Bld: 99 mg/dL (ref 70–99)
POTASSIUM: 3.6 meq/L — AB (ref 3.7–5.3)
Sodium: 137 mEq/L (ref 137–147)

## 2014-07-28 LAB — CBC WITH DIFFERENTIAL/PLATELET
BASOS PCT: 0 % (ref 0–1)
Basophils Absolute: 0 10*3/uL (ref 0.0–0.1)
Eosinophils Absolute: 0.1 10*3/uL (ref 0.0–0.7)
Eosinophils Relative: 1 % (ref 0–5)
HEMATOCRIT: 34.1 % — AB (ref 39.0–52.0)
HEMOGLOBIN: 11.6 g/dL — AB (ref 13.0–17.0)
LYMPHS ABS: 2.3 10*3/uL (ref 0.7–4.0)
Lymphocytes Relative: 53 % — ABNORMAL HIGH (ref 12–46)
MCH: 29.8 pg (ref 26.0–34.0)
MCHC: 34 g/dL (ref 30.0–36.0)
MCV: 87.7 fL (ref 78.0–100.0)
MONO ABS: 0.4 10*3/uL (ref 0.1–1.0)
MONOS PCT: 8 % (ref 3–12)
Neutro Abs: 1.7 10*3/uL (ref 1.7–7.7)
Neutrophils Relative %: 38 % — ABNORMAL LOW (ref 43–77)
Platelets: 208 10*3/uL (ref 150–400)
RBC: 3.89 MIL/uL — AB (ref 4.22–5.81)
RDW: 12.9 % (ref 11.5–15.5)
WBC: 4.5 10*3/uL (ref 4.0–10.5)

## 2014-07-28 LAB — GLUCOSE, CAPILLARY
GLUCOSE-CAPILLARY: 104 mg/dL — AB (ref 70–99)
Glucose-Capillary: 121 mg/dL — ABNORMAL HIGH (ref 70–99)
Glucose-Capillary: 105 mg/dL — ABNORMAL HIGH (ref 70–99)
Glucose-Capillary: 124 mg/dL — ABNORMAL HIGH (ref 70–99)

## 2014-07-28 MED ORDER — METHOCARBAMOL 500 MG PO TABS: 500.0000 mg | ORAL_TABLET | Freq: Four times a day (QID) | ORAL | Status: DC | PRN

## 2014-07-28 MED ORDER — OXYCODONE HCL 15 MG PO TABS: 15.0000 mg | ORAL_TABLET | Freq: Four times a day (QID) | ORAL | Status: DC | PRN

## 2014-07-28 MED ORDER — LORAZEPAM 0.5 MG PO TABS: 0.5000 mg | ORAL_TABLET | Freq: Two times a day (BID) | ORAL | Status: DC

## 2014-07-28 MED ORDER — METHYLPHENIDATE HCL ER 36 MG PO TB24: 36.0000 mg | ORAL_TABLET | Freq: Every day | ORAL | Status: DC

## 2014-07-28 MED ORDER — PREGABALIN 200 MG PO CAPS: 200.0000 mg | ORAL_CAPSULE | Freq: Three times a day (TID) | ORAL | Status: DC

## 2014-07-28 MED ORDER — SENNOSIDES-DOCUSATE SODIUM 8.6-50 MG PO TABS: 2.0000 | ORAL_TABLET | Freq: Two times a day (BID) | ORAL | Status: DC

## 2014-07-28 MED ORDER — PANTOPRAZOLE SODIUM 40 MG PO TBEC: 40.0000 mg | DELAYED_RELEASE_TABLET | Freq: Every day | ORAL | Status: DC

## 2014-07-28 MED ORDER — ASPIRIN EC 81 MG PO TBEC: 81.0000 mg | DELAYED_RELEASE_TABLET | Freq: Every day | ORAL | Status: DC

## 2014-07-28 MED ORDER — ALLOPURINOL 100 MG PO TABS: 100.0000 mg | ORAL_TABLET | Freq: Every day | ORAL | Status: DC

## 2014-07-28 MED ORDER — FAMOTIDINE 40 MG PO TABS: 40.0000 mg | ORAL_TABLET | Freq: Two times a day (BID) | ORAL | Status: DC

## 2014-07-28 MED ORDER — POLYETHYLENE GLYCOL 3350 17 G PO PACK: 17.0000 g | PACK | Freq: Every day | ORAL | Status: DC

## 2014-07-28 NOTE — Discharge Summary (Signed)
Physician Discharge Summary  Patient ID: Frank [name] MRN: [id number] DOB/AGE: [birth year] 58 y.o.  Admit date: 07/11/2014 Discharge date: 07/28/2014  Discharge Diagnoses:  Principal Problem:   Guillain Barr syndrome Active Problems:   Uncontrolled type II diabetes mellitus with polyneuropathy   C. difficile colitis   Discharged Condition: Stable.      Labs:  Basic Metabolic Panel:  Recent Labs Lab 07/28/14 1714  NA 137  K 3.6*  CL 98  CO2 29  GLUCOSE 99  BUN 14  CREATININE 0.80  CALCIUM 9.4    CBC:  Recent Labs Lab 07/28/14 1714  WBC 4.5  NEUTROABS 1.7  HGB 11.6*  HCT 34.1*  MCV 87.7  PLT 208    CBG:  Recent Labs Lab 07/27/14 1713 07/27/14 2103 07/28/14 0647 07/28/14 1154 07/28/14 1707  GLUCAP 159* 115* 121* 105* 124*    Brief HPI:   Frank [name] is a 58 y.o. male with history of DM type 2 with peripheral neuropathy, anxiety disorder, left ankle fracture 02/2014, back and shoulder pain with mild lower extremity weakness since Oct 1st. He was evaluated by ED multiple times diagnosis of PNA as well as urinary retention, N/V with decrease in intake. MRI thoracic spine with left paracentral disc extrusion at T7-8 with mild cord flattening but no abnormal cord signal or compression deformity and underwent esophageal dilatation and was discharged to SNF on 11/18. He was readmitted on 06/30/14 with rapidly ascending paralysis and severe pain. 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 He was started on IVIG and intubated due to concerns of impending respiratory failure. He tolerated extubation on 11/25 and respiratory status stable. Swallow evaluation done and patient started on dysphagia 2, thin liquids due to generalized weakness. He has had issues with anxiety as well as paranoia  requiring IV ativan. Pain has been controlled on IV fentanyl. He developed diarrhea due to  cdiff and was started on flagyl for treatment. Therapy initiated and patient noted to be deconditioned and CIR recommended for follow up therapy.    Hospital Course: [name] was admitted to rehab 07/11/2014 for inpatient therapies to consist of PT, ST and OT at least three hours five days a week. Past admission physiatrist, therapy team and rehab RN have worked together to provide customized collaborative inpatient rehab. He completed two week course of flagyl and diarrhea has resolved. Po intake has improved with addition of supplements. Diabetes has been monitored on ac/hs basis and blood sugars are controlled of oral medications.  Team has provided ego support for high levels of anxiety and to help with tolerance of therapy. Ativan was scheduled to help with panic attacks. He developed dysuria due to E coli UTI and was treated with short course of pyridium and five day course of keflex. Pepcid was increased to 40 mg bid to help with GERD symptoms.  He continued to have complaints of pain due to dysesthesias and lyrica was increased to tid. Oxycodone has been used additionally on prn basis. Concerta was resumed to help with endurance as well as attention.  He was making gain in rehab initially but had functional decline 2-3  days prior to discharge with inability to ambulate as well as difficulty with transfers. He was noted some difficulty talking with poor breath support and NIF/VC was ordered to rule out respiratory decline. Neurology was  consulted for input on 12/18 and recommended monitoring overnight with NIF/VC every  8 hours. Patient's respiratory status was stable overnight and no significant neurologic decline was noted. Patient's overall progress has been limited and he required min assist with all activities. SNF was discussed for further therapies but family elected on home with hired caregivers. He  will continue to receive HHPT and HHOT by [facility] therapy.  He was discharged to home on 07/29/14    Rehab course: During patient's stay in rehab weekly team conferences were held to monitor patient's progress, set goals and discuss barriers to discharge. His progress has fluctuated with functional decline due to anxiety, decreased motor control, ataxia, and poor activity tolerance. He was modified independent for wheelchair propulsion at household distance.  He requires min assist for transfer and is unable to ambulate. He requires supervision for bathing and upper body dressing tasks as well as moderate assist with lower body dressing.     Disposition: Home   Diet: Regular  Special Instructions: 1. Note the decrease in dose of Cymbalta and Concerta 2. Do not use Glipizide, Clonazepam, Colchicine or Amlodipine      Medication List    STOP taking these medications        amLODipine 10 MG tablet  Commonly known as:  NORVASC     atorvastatin 10 MG tablet  Commonly known as:  LIPITOR     clonazePAM 0.5 MG tablet  Commonly known as:  KLONOPIN     colchicine 0.6 MG tablet     glipiZIDE 2.5 MG 24 hr tablet  Commonly known as:  GLUCOTROL XL     hydrALAZINE 10 MG tablet  Commonly known as:  APRESOLINE     methylphenidate 54 MG CR tablet  Commonly known as:  CONCERTA  Replaced by:  methylphenidate 36 MG CR tablet     metoCLOPramide 5 MG tablet  Commonly known as:  REGLAN     metroNIDAZOLE 500 MG tablet  Commonly known as:  FLAGYL     NUCYNTA 100 MG Tabs  Generic drug:  Tapentadol HCl     ondansetron 4 MG tablet  Commonly known as:  ZOFRAN     oxyCODONE-acetaminophen 7.5-325 MG per tablet  Commonly known as:  PERCOCET      TAKE these medications        allopurinol 100 MG tablet  Commonly known as:  ZYLOPRIM  Take 1 tablet (100 mg total) by mouth daily.     aspirin EC 81 MG tablet  Take 1 tablet (81 mg total) by mouth daily.     DULoxetine 30 MG capsule   Commonly known as:  CYMBALTA  Take 30 mg by mouth daily.     famotidine 40 MG tablet  Commonly known as:  PEPCID  Take 1 tablet (40 mg total) by mouth 2 (two) times daily.     glucose blood test strip  Commonly known as:  FREESTYLE TEST STRIPS  Use as instructed     LORazepam 0.5 MG tablet---Rx  #60 pills  Commonly known as:  ATIVAN  Take 1 tablet (0.5 mg total) by mouth 2 (two) times daily.     methocarbamol 500 MG tablet  Commonly known as:  ROBAXIN  Take 1 tablet (500 mg total) by mouth every 6 (six) hours as needed for muscle spasms.     methylphenidate 36 MG CR tablet--Rx #30 pills  Commonly known as:  CONCERTA  Take 1 tablet (36 mg total) by mouth daily.  oxyCODONE 15 MG immediate release tablet--Rx # 90 pills   Commonly known as:  ROXICODONE  Take 1 tablet (15 mg total) by mouth every 6 (six) hours as needed for severe pain.     pantoprazole 40 MG tablet  Commonly known as:  PROTONIX  Take 1 tablet (40 mg total) by mouth daily.     polyethylene glycol packet  Commonly known as:  MIRALAX / GLYCOLAX  Take 17 g by mouth daily.     pregabalin 200 MG capsule  Commonly known as:  LYRICA  Take 1 capsule (200 mg total) by mouth 3 (three) times daily.     senna-docusate 8.6-50 MG per tablet  Commonly known as:  Senokot-S  Take 2 tablets by mouth 2 (two) times daily.           Follow-up Information    Follow up with Meredith Staggers, MD On 09/11/2014.   Specialty:  Physical Medicine and Rehabilitation   Why:  Be  there at 10:20 am  for  10:40 am  appointment   Contact information:   [address], [address] [place] [address] [phone]       Follow up with [facility]. Call today.   Why:  for follow up appointment.      Follow up with Cathlean Cower, MD On 08/08/2014.   Specialties:  Internal Medicine, Radiology   Why:  @ 2:15 PM   Contact information:   [address] [place] [address] [phone]       Signed: Bary Leriche 07/28/2014, 6:28 PM

## 2014-07-28 NOTE — Progress Notes (Signed)
Occupational Therapy Session Note  Patient Details  Name: Frank [name] MRN: [id number] Date of Birth: [birth year]  Today's Date: 07/28/2014 OT Individual Time: 1530-1600 OT Individual Time Calculation (min): 30 min (make up session)   Short Term Goals: Week 2:  OT Short Term Goal 1 (Week 2): Patient will complete shower stall transfer without use of STEDY at a consistant mod assist level  OT Short Term Goal 2 (Week 2): Patient will complete toilet transfer without use of STEDY at a consistant mod assist level OT Short Term Goal 3 (Week 2): Patient will be independent with a BUE/core strengthening HEP OT Short Term Goal 4 (Week 2): Patient will perform sit<>stands using RW in order to pull up pants  Skilled Therapeutic Interventions/Progress Update: Pt found seated in wheelchair in therapy gym and transitioning easily from PT session. Pt reporting B UE weakness. Therapist assisting pt via wheelchair back to room secondary to fatigue. Pt requesting to practice toilet transfer this session. Drop arm commode chair utilized for wheelchair transfer <> drop arm commode with min verbal cues for set up and Mod A for squat pivot transfer. Pt then instructed on lateral leans for clothing management with pt able to push down and pull up pants with verbal cues and increased time. OT also educated pt on pushing through B UE into squat with bottom cleared for caregiver to remove pants if toileting urgent at that time. Pt fatigued and requesting to return to bed with Mod A squat pivot from wheelchair > bed and close supervision for sit >supine. Bed alarm on and call bell within reach upon exiting the room.   Therapy Documentation Precautions:  Precautions Precautions: Fall Precaution Comments: h/o multiple falls Restrictions Weight Bearing Restrictions: No  See FIM for current functional status  Therapy/Group: Individual Therapy  Phineas Semen 07/28/2014, 4:29 PM

## 2014-07-28 NOTE — Progress Notes (Signed)
Occupational Therapy Session Note  Patient Details  Name: Frank [name] MRN: [id number] Date of Birth: 1955-09-12  Today's Date: 07/28/2014 OT Individual Time: 1856-3149 OT Individual Time Calculation (min): 66 min    Short Term Goals: Week 2:  OT Short Term Goal 1 (Week 2): Patient will complete shower stall transfer without use of STEDY at a consistant mod assist level  OT Short Term Goal 2 (Week 2): Patient will complete toilet transfer without use of STEDY at a consistant mod assist level OT Short Term Goal 3 (Week 2): Patient will be independent with a BUE/core strengthening HEP OT Short Term Goal 4 (Week 2): Patient will perform sit<>stands using RW in order to pull up pants  Skilled Therapeutic Interventions/Progress Updates: ADL-retraining with focus on dynamic sitting balance, transfers, and family ed (with Brayton Layman, friend of family who will assist during daytime, s/p d/c).    Pt received at end of physical therapy session, escorted by friend as he propelled w/c toward his room.   Pt requested assist with toileting upon entering his room.   OT provided setup assist to manage w/c however pt was unable to rise off seat to perform stand-pivot or squat-pivot transfer to toilet d/t fatigue from prior session.   Pt offered alternate plan to use urinal while seated on tub bench.   Pt was able to complete lateral scoot transfer to tub bench using grab bar and mod assist (lifting).    After setup with bathing, pt completed bathing with setup assist to expedite session d/t impaired coordination with diminished [place] while manipulating wash cloth and soap.   Pt returned to w/c with min assist but required use of STEDY prior to transfer to don briefs at his request.   Pt transferred back to w/c and dressed at sink, donning upper body clothing unassisted and lower body with overall mod assist (pt=50%) after setup to re-apply acewrap to right ankle.    Pt aware of use of loose-fitting clothing to improve  efficiency and independence with dressing skills.    Pt reports plan to remain at home with assistance from friend but reported concern regarding his ability to manage keyboard of his computer (newer laptop per pt).    OT educated pt on use of voice-recognition software and friend shared that she was familiar with optional applications to teach and enable pt to perform searches and/or dictate messages/documents, as needed.   Pt groomed at sink and remained in his w/c at end of session with safety belt attached and all needs within reach.     Therapy Documentation Precautions:  Precautions Precautions: Fall Precaution Comments: h/o multiple falls Restrictions Weight Bearing Restrictions: No  Pain: Pain Assessment Pain Assessment: 0-10 Pain Score: 8  Pain Type: Neuropathic pain Pain Location: Generalized Pain Descriptors / Indicators: Tingling Pain Onset: On-going Pain Intervention(s): Medication (See eMAR);Distraction;Emotional support Multiple Pain Sites: No  See FIM for current functional status  Therapy/Group: Individual Therapy  Mckinna Demars 07/28/2014, 10:45 AM

## 2014-07-28 NOTE — Progress Notes (Signed)
Physical Therapy Session Note  Patient Details  Name: Frank [name] MRN: [id number] Date of Birth: April 02, 1956  Today's Date: 07/28/2014 PT Individual Time: 1100-1130 PT Individual Time Calculation (min): 30 min   Short Term Goals: Week 2:  PT Short Term Goal 1 (Week 2): = LTGs  Skilled Therapeutic Interventions/Progress Updates:   Pt's cargiver, Frank [name], present at beginning of session and both her and patient state that they feel comfortable with all hands on training that was provided this morning. Frank [name] denies need for further practice (she had experience with family members as well) and is confident with ability to provide min A level of care as needed to pt. Offered to practice car transfer but declined due to her not being the one to take pt out and about. Frank [name] already completed this education with previous PT this week. Focused session on car transfer in preparation for planned d/c tomorrow using squat pivot tecnique. Pt required min A to get into car and mod A (with several attempts first at max A due to increased anxiety limiting pt's ability to motor plan and coordinate transfer). Extra time needed with w/c propulsion for functional mobility c/o of it being difficult to push. Asked why there was no longer theraband on the rims to increased grip and pt reports he wanted it off because it hurt his hands. Therefore, required supervision for cueing to efficiency of technique and extra time. Pt c/o being too exhausted to attempt standing and states he was unable this AM with other PT.  Pt plan is still to d/c home tomorrow despite recommendation for SNF. This therapist  Verbalized concerns with pt who is adamant about discharge and caregivers feel they can provide appropriate assist.   Therapy Documentation Precautions:  Precautions Precautions: Fall Precaution Comments: h/o multiple falls Restrictions Weight Bearing Restrictions: No Pain: Pain Assessment Pain Assessment: 0-10 Pain  Score: 8  Pain Type: Neuropathic pain Pain Location: Generalized Pain Descriptors / Indicators: Tingling Pain Onset: On-going Pain Intervention(s): Medication (See eMAR);Distraction;Emotional support Multiple Pain Sites: No See FIM for current functional status  Therapy/Group: Individual Therapy  Frank [name] [facility] 07/28/2014, 12:03 PM

## 2014-07-28 NOTE — Progress Notes (Signed)
PT VC 550, NIF -30

## 2014-07-28 NOTE — Progress Notes (Signed)
Social Work Patient ID: Frank [name], male   DOB: [birth year], 59 y.o.   MRN: [id number]   CSW spoke with PT and OT who are still recommending SNF for pt.  Pt still wishing to go home at this time despite the recommendation and CSW's offer to pursue SNF. Pt with more weakness the last two days and now short of breath when he is talking to CSW.  CSW asked pt if he is feeling anxious about going home and said he doesn't feel like he is.  He is concerned about weakness last couple of days, as well.  CSW mentioned these concerns to PA, Reesa Chew, who addressed them with Dr. Naaman Plummer and they completed further workup.  Also spoke with pt's partner, [name], who feels they are going to try to take pt home at least for Christmas.  [name] was also concerned about the above and CSW told him that PA was evaluating this and would f/u with him.  Ramp to be installed at the home in the morning and drop arm commode to be delivered.  Wheelchair was delivered to pt's room.  [name] was ordered with request for [name], PT made.  CSW encouraged [name] to call with any concerns.

## 2014-07-28 NOTE — Plan of Care (Signed)
Problem: RH Balance Goal: LTG Patient will maintain dynamic sitting balance (PT) LTG: Patient will maintain dynamic sitting balance with assistance during mobility activities (PT)  Outcome: Not Met (add Reason) S recommended for safety as pt with increased ataxia and impulsivity with movement Goal: LTG Patient will maintain dynamic standing balance (PT) LTG: Patient will maintain dynamic standing balance with assistance during mobility activities (PT)  Outcome: Not Met (add Reason) Requires min to max A to maintain balance when able to perform standing activities which has declined in last several days.  Problem: RH Bed to Chair Transfers Goal: LTG Patient will perform bed/chair transfers w/assist (PT) LTG: Patient will perform bed/chair transfers with assistance, with/without cues (PT).  Outcome: Not Met (add Reason) Inconsistent with transfers; min to mod A  Problem: RH Stairs Goal: LTG Patient will ambulate up and down stairs w/assist (PT) LTG: Patient will ambulate up and down # of stairs with assistance (PT)  Outcome: Not Met (add Reason) Was requiring mod A +2 but last few days has been very limited with standing/gait and unable to perform stairs.  Problem: RH Ambulation Goal: LTG Patient will ambulate in controlled environment (PT) LTG: Patient will ambulate in a controlled environment, # of feet with assistance (PT).  Outcome: Not Met (add Reason) Has not been able to perform in last couple days safely without +2 assist

## 2014-07-28 NOTE — Progress Notes (Signed)
[place] PHYSICAL MEDICINE & REHABILITATION     PROGRESS NOTE    Subjective/Complaints: No new issues over night. Feels things went well yesterday with therapy and SW A  review of systems has been performed and if not noted above is otherwise negative.   Objective: Vital Signs: Blood pressure 136/74, pulse 78, temperature 98.1 F (36.7 C), temperature source Oral, resp. rate 18, height 5\' 9"  (1.753 m), weight 77.3 kg (170 lb 6.7 oz), SpO2 98 %. No results found. No results for input(s): WBC, HGB, HCT, PLT in the last 72 hours. No results for input(s): NA, K, CL, GLUCOSE, BUN, CREATININE, CALCIUM in the last 72 hours.  Invalid input(s): CO CBG (last 3)   Recent Labs  07/27/14 1713 07/27/14 2103 07/28/14 0647  GLUCAP 159* 115* 121*    Wt Readings from Last 3 Encounters:  07/28/14 77.3 kg (170 lb 6.7 oz)  07/10/14 81.693 kg (180 lb 1.6 oz)  07/02/14 85.276 kg (188 lb)    Physical Exam:  Constitutional: He is oriented to person, place, and time. He appears well-developed. No distress.  HENT: oral mucosa pink and moist. No breakdown or irrititation. No drainage. A few caries Head: Normocephalic and atraumatic.  Eyes: Conjunctivae and EOM are normal. Pupils are equal, round, and reactive to light.  Neck: No JVD present. No tracheal deviation present. No thyromegaly present.  Cardiovascular: Normal rate and regular rhythm.  Respiratory: No respiratory distress. He has no wheezes. He has no rales. He exhibits no tenderness.  GI: He exhibits no distension. There is no tenderness. There is no rebound and no guarding.  Musculoskeletal: He exhibits no edema or tenderness.  Lymphadenopathy:   He has no cervical adenopathy.  Neurological: He is alert and oriented to person, place, and time.    sensory loss distal greater than proximal in stocking glove distribution, in all 4 limbs, lower more affected than uppers. Motor: 4+/5 deltoid, bicep, tricep, hands. LE: 4+/5 hf, 4+  ke  and 4+/5 ankles-- .  Skin: Skin is warm.   Psychiatric:  Anxious at times--baseline  Assessment/Plan: 1. 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 Physiatrist is providing close team supervision and 24 hour management of active medical problems listed below. Physiatrist and rehab team continue to assess barriers to discharge/monitor patient progress toward functional and medical goals.  On track for dc 12/19 with [facility] follow up  FIM: FIM - Bathing Bathing Steps Patient Completed: Chest, Right Arm, Left Arm, Abdomen, Front perineal area, Right upper leg, Left upper leg, Buttocks, Right lower leg (including foot), Left lower leg (including foot) Bathing: 5: Supervision: Safety issues/verbal cues  FIM - Upper Body Dressing/Undressing Upper body dressing/undressing steps patient completed: Thread/unthread right sleeve of pullover shirt/dresss, Thread/unthread left sleeve of pullover shirt/dress, Put head through opening of pull over shirt/dress, Pull shirt over trunk Upper body dressing/undressing: 6: Assistive device (Comment) FIM - Lower Body Dressing/Undressing Lower body dressing/undressing steps patient completed: Thread/unthread right pants leg, Thread/unthread left pants leg, Don/Doff left sock Lower body dressing/undressing: 3: Mod-Patient completed 50-74% of tasks  FIM - Toileting Toileting steps completed by patient: Performs perineal hygiene Toileting Assistive Devices: Grab bar or rail for support Toileting: 2: Max-Patient completed 1 of 3 steps  FIM - Radio producer Devices: Elevated toilet seat Toilet Transfers: 1-Mechanical lift  FIM - Control and instrumentation engineer Devices: Arm rests Bed/Chair Transfer: 4: Supine >  Sit: Min A (steadying Pt. > 75%/lift 1 leg), 5: Sit > Supine: Supervision (verbal cues/safety  issues), 4: Bed > Chair or W/C: Min A (steadying Pt. > 75%), 4: Chair or W/C > Bed: Min A (steadying Pt. > 75%)  FIM - Locomotion: Wheelchair Distance: 150 Locomotion: Wheelchair: 6: Travels 150 ft or more, turns around, maneuvers to table, bed or toilet, negotiates 3% grade: maneuvers on rugs and over door sills independently FIM - Locomotion: Ambulation Locomotion: Ambulation Assistive Devices: Administrator Ambulation/Gait Assistance: 3: Mod assist, 1: +2 Total assist (+2 for close w/c follow) Locomotion: Ambulation: 0: Activity did not occur  Comprehension Comprehension Mode: Auditory Comprehension: 5-Understands complex 90% of the time/Cues < 10% of the time  Expression Expression Mode: Verbal Expression: 5-Expresses basic 90% of the time/requires cueing < 10% of the time.  Social Interaction Social Interaction: 6-Interacts appropriately with others with medication or extra time (anti-anxiety, antidepressant).  Problem Solving Problem Solving: 6-Solves complex problems: With extra time  Memory Memory: 4-Recognizes or recalls 75 - 89% of the time/requires cueing 10 - 24% of the time  Medical Problem List and Plan: 1. Functional deficits secondary to Guillain Barre Syndrome (vs CIDP) in patient with chronic peipheral neuropathy 2. DVT Prophylaxis/Anticoagulation: Pharmaceutical: Lovenox 3. Chronic Pain Management: Was on nucynta 100 mg tid PTA.--can resume at dc  -substituted oxycodone for nucynta while here  -lyrica now tid and cymbalta 4. Anxiety/depression/Mood: Continue Cymbalta for mood stabilization. Will continue ativan po prn for anxiety. LCSW to follow for evaluation and support.   -resumed concerta (lower than his presumed home dose for now) 5. Neuropsych: This patient is capable of making decisions on his own behalf. 6. Skin/Wound Care: Routine pressure relief measures 7. Fluids/Electrolytes/Nutrition: Monitor I/O. BMET within normal  limits today 8. HTN: Blood pressure controlled off medications.  9. Peripheral neuropathy: Continue   Cymbalta.   -increased lyrica to tid 10. DM type 2:   May need glucotrol resumed as po intake improves.   -for now sugars are controlled 11. Anemia: hgb up to 11.3  10 OSA: no new issues 11. GERD s/p recent dilatation of esophageal stricture:   -increased pepcid to 40mg  bid with improvement 12. C diff colitis: Flagyl through 12/13 (14 days completed) --no more loose stools   -ID now saying he needs 30 days after treatment to clear from precautions  13. Urinary frequency: somewhat improved  -UTI: 100k E Coli  - keflex completed   LOS (Days) 17 A FACE TO FACE EVALUATION WAS PERFORMED  SWARTZ,ZACHARY T 07/28/2014 7:41 AM

## 2014-07-28 NOTE — Progress Notes (Signed)
Physical Therapy Session Note  Patient Details  Name: Frank [name] MRN: [id number] Date of Birth: [birth year]  Today's Date: 07/28/2014 PT Individual Time: 0830-0930 PT Individual Time Calculation (min): 60 min   Short Term Goals: Week 2:  PT Short Term Goal 1 (Week 2): = LTGs  Skilled Therapeutic Interventions/Progress Updates:    Therapeutic Activity: Pt requests to change t-shirts and so PT obtains a clean t-shirt for pt and he changes it req verbal cues for technique (put your head through first) and significantly increased time.  Family friend Frank [name] present for family training: PT instructs pt in squat-pivot transfer req CGA-min A from w/c to/from mat. Monique then demonstrates understanding x 4 reps total (twice providing min A and twice initiating with CGA, but then coming in quickly for a min A as pt needs it).   W/C Management: PT instructs pt in self propelling manual w/c x 150' on level surface req SBA and increased time due to impaired fine motor coordination of the hands. Pt also req verbal cues to look at his feet and keep them on the footplate.   Gait Training: Pre-gait activity: Standing with RW and marching in place req min-mod A for dynamic stand balance x 7 reps each LE and a second set of x 2 reps each LE - both times req assist to sit in w/c strategically placed directly behind pt due to weakness/anxiety/fear of falling.  PT instructs pt in ambulation with RW (unweighted due to no weights at home) x 2' req mod A, then pt again is assisted back into w/c due to fear/anxiety/weakness/poor balance.  Neuromuscular Reeducation: PT instructs pt in supine bridges with manual assist at feet for stability and at knees for assisting in anterior tibial translation: 2 x 10 reps, to improve glute max strength and improve motor control of activating gluteals during movement.   Pt demonstrates anxiety during PT session, today, although he verbally denies feeling anxious. Pt gets  overwhelmed easily with difficulty of completing a task (eg - donning shirt, transfer, w/c set up for a transfer) and then begins to rush through the activity, which causes his safety awareness to plummet. Pt will consistently req at least CGA, but more likely frequent min A during squat-pivot transfers for safety and family friend Frank [name] demonstrates understanding. Pt has poor compensatory techniques for lack of motor control/ataxia during gait - PT notes that he will lift his leg to step, but then hold it in the air for multiple seconds before placing it forward. PT explains to pt that this puts too heavy of a workload on the supporting leg and that he needs to just step his foot forward in a quick manner, even if it appears to be a heavy step. Pt verbalizes understanding. Pt has demonstrated functional decline since this PT worked with him 2 weeks ago, when pt was able to ambulate 77' with mod A x 2 reps. Pt is SNF appropriate, but family plans on taking him home. Continue per PT POC.   Therapy Documentation Precautions:  Precautions Precautions: Fall Precaution Comments: h/o multiple falls Restrictions Weight Bearing Restrictions: No Pain: Pain Assessment Pain Assessment: 0-10 Pain Score: 8  Pain Type: Neuropathic pain Pain Location: Generalized Pain Descriptors / Indicators: Stabbing Pain Onset: On-going Pain Intervention(s): Rest;Distraction Multiple Pain Sites: No  See FIM for current functional status  Therapy/Group: Individual Therapy with Raylene Everts, PT as +2  [facility] M 07/28/2014, 8:34 AM

## 2014-07-28 NOTE — Progress Notes (Signed)
Subjective: Patients states he has had mild progressive weakness over the last few days. Initial NIF -30 and VC 1.5 L current NIF -30 and VC 550.   Objective: Current vital signs: BP 121/75 mmHg  Pulse 108  Temp(Src) 97.7 F (36.5 C) (Oral)  Resp 18  Ht 5\' 9"  (1.753 m)  Wt 77.3 kg (170 lb 6.7 oz)  BMI 25.15 kg/m2  SpO2 98% Vital signs in last 24 hours: Temp:  [97.7 F (36.5 C)-98.1 F (36.7 C)] 97.7 F (36.5 C) (12/18 1446) Pulse Rate:  [78-108] 108 (12/18 1446) Resp:  [18-19] 18 (12/18 1446) BP: (121-136)/(74-75) 121/75 mmHg (12/18 1446) SpO2:  [98 %] 98 % (12/18 1446) Weight:  [77.3 kg (170 lb 6.7 oz)] 77.3 kg (170 lb 6.7 oz) (12/18 0538)  Intake/Output from previous day: 12/17 0701 - 12/18 0700 In: 1080 [P.O.:1080] Out: 1150 [Urine:1150] Intake/Output this shift: Total I/O In: 840 [P.O.:840] Out: 650 [Urine:650] Nutritional status: Diet Carb Modified  Neurologic Exam:  Mental Status: Alert, oriented, thought content appropriate.  Speech fluent without evidence of aphasia.  Able to follow 3 step commands without difficulty. Cranial Nerves: II:  Visual fields grossly normal, pupils equal, round, reactive to light and accommodation III,IV, VI: ptosis not present, extra-ocular motions intact bilaterally V,VII: smile symmetric, facial light touch sensation normal bilaterally VIII: hearing normal bilaterally IX,X: gag reflex present XI: bilateral shoulder shrug XII: midline tongue extension without atrophy or fasciculations  Motor: Right : Upper extremity   4/5    Left:     Upper extremity   4/5  Lower extremity   5/5     Lower extremity   5/5 --distal LE in PF and DF shows 4/5 strength.  --during exam patients strength is effort dependent and if distracted shows above strength.  Tone and bulk:normal tone throughout; no atrophy noted Sensory: Initially states he could not feel his LE but after noxious stimuli given he states he could feel both LT and PP bilaterally  LE.  UE sensation intact.  Deep Tendon Reflexes:  1+ bilateral UE and no KJ or AJ noted.  Plantars: Mute bilaterally Cerebellar: normal finger-to-nose,  normal heel-to-shin test Gait: not t CV: pulses palpable throughout    Lab Results: Basic Metabolic Panel: No results for input(s): NA, K, CL, CO2, GLUCOSE, BUN, CREATININE, CALCIUM, MG, PHOS in the last 168 hours.  Liver Function Tests: No results for input(s): AST, ALT, ALKPHOS, BILITOT, PROT, ALBUMIN in the last 168 hours. No results for input(s): LIPASE, AMYLASE in the last 168 hours. No results for input(s): AMMONIA in the last 168 hours.  CBC: No results for input(s): WBC, NEUTROABS, HGB, HCT, MCV, PLT in the last 168 hours.  Cardiac Enzymes: No results for input(s): CKTOTAL, CKMB, CKMBINDEX, TROPONINI in the last 168 hours.  Lipid Panel: No results for input(s): CHOL, TRIG, HDL, CHOLHDL, VLDL, LDLCALC in the last 168 hours.  CBG:  Recent Labs Lab 07/27/14 1153 07/27/14 1713 07/27/14 2103 07/28/14 0647 07/28/14 1154  GLUCAP 88 159* 115* 121* 105*    Microbiology: Results for orders placed or performed during the hospital encounter of 07/11/14  Culture, Urine     Status: None   Collection Time: 07/14/14  9:21 AM  Result Value Ref Range Status   Specimen Description URINE, CATHETERIZED  Final   Special Requests NONE  Final   Culture  Setup Time   Final    07/14/2014 10:14 Performed at [facility]   Final    >=  100,000 COLONIES/ML Performed at [facility] Performed at [facility]    Report Status 07/16/2014 FINAL  Final   Organism ID, Bacteria ESCHERICHIA COLI  Final      Susceptibility   Escherichia coli - MIC*    AMPICILLIN >=32 RESISTANT Resistant     CEFAZOLIN <=4 SENSITIVE Sensitive     CEFTRIAXONE <=1 SENSITIVE Sensitive     CIPROFLOXACIN <=0.25 SENSITIVE Sensitive     GENTAMICIN <=1 SENSITIVE Sensitive      LEVOFLOXACIN <=0.12 SENSITIVE Sensitive     NITROFURANTOIN <=16 SENSITIVE Sensitive     TOBRAMYCIN <=1 SENSITIVE Sensitive     TRIMETH/SULFA <=20 SENSITIVE Sensitive     PIP/TAZO <=4 SENSITIVE Sensitive     * ESCHERICHIA COLI    Coagulation Studies: No results for input(s): LABPROT, INR in the last 72 hours.  Imaging: No results found.  Medications:  Scheduled: . antiseptic oral rinse  7 mL Mouth Rinse BID  . chlorhexidine  15 mL Mouth/Throat BID  . DULoxetine  30 mg Oral Daily  . enoxaparin (LOVENOX) injection  40 mg Subcutaneous Q24H  . famotidine  40 mg Oral BID  . feeding supplement (RESOURCE BREEZE)  1 Container Oral Daily  . insulin aspart  0-9 Units Subcutaneous TID WC  . LORazepam  0.5 mg Oral BID  . methylphenidate  36 mg Oral Daily  . polyethylene glycol  17 g Oral Daily  . pregabalin  200 mg Oral TID  . senna-docusate  2 tablet Oral BID    Assessment/Plan:  Neurology was called back due to 2 day increased weakness.  On exam he showed no significant extremity strength but his VC was significantly lower than admission.   Recommends:  1) Repeat NIF and VC Q 8 hours 2) Will follow up tomorrow and reevaluate    Etta Quill PA-C [facility] [phone]  07/28/2014, 4:40 PM

## 2014-07-28 NOTE — Progress Notes (Signed)
Physical Therapy Session Note  Patient Details  Name: Frank [name] MRN: [id number] Date of Birth: 1956-03-13  Today's Date: 07/28/2014 PT Individual Time: 1500-1530 PT Individual Time Calculation (min): 30 min   Short Term Goals: Week 2:  PT Short Term Goal 1 (Week 2): = LTGs  Skilled Therapeutic Interventions/Progress Updates:   Session focused on functional w/c level transfers (min A and requires physical A for w/c parts management due to decreased coordination and low frustration tolerance), bed mobility on flat surface on mat to perform functional LE strengthening exercises (supine heel slides with focus on control of movement and bridging for closed chain strengthening exercise) and d/c assessment of strength and sensation, and attempted 1 step (4") with total A +2 but unable to go further then place foot onto the step. PA Pam Love came to assess patient while on mat table and asking if pt feeling like he was nervous about going home but pt denies. As pt talking with PA, increased SOB occurred and pt with difficulty expressing himself.  Handoff to OT in the gym.   Therapy Documentation Precautions:  Precautions Precautions: Fall Precaution Comments: h/o multiple falls Restrictions Weight Bearing Restrictions: No   Pain: "The same" - premedicated.  See FIM for current functional status  Therapy/Group: Individual Therapy  [name] 07/28/2014, 3:50 PM

## 2014-07-28 NOTE — Progress Notes (Signed)
Occupational Therapy Discharge Summary  Patient Details  Name: Frank [name] MRN: [id number] Date of Birth: 1955/12/31  Patient has met 7 of 9 long term goals due to improved activity tolerance, improved balance, postural control, ability to compensate for deficits, improved attention, improved awareness and improved coordination.  Patient to discharge at overall min>mod assist level.  Patient's care partner is independent to provide the necessary physical assistance at discharge. Patient's partner, Legrand Como and friend Beckie Busing were present for family education. Both state they feel comfortable assisting patient at home. Clinical recommendation is for patient to d/c > SNF, but patient unwilling. IF patient d/c home, recommending [facility].   Reasons goals not met: Patient did not meet toileting goal of mod assist, patient currently requires max>total assist for toileting secondary to ataxia and decreased sensation throughout BUEs hands. Patient also did not meet toilet transfer goal of min assist, patient is inconsistent with level of assistance needed for toilet transfers at this time.   Recommendation:  Patient will benefit from ongoing skilled OT services in skilled nursing facility setting to continue to advance functional skills in the area of BADL, iADL and Reduce care partner burden.  Patient refuses SNF placement at this time even after discussion with OT, PT, and SW. Patient continues to present with ataxia throughout body which causes unsafe movements when seated unsupported and during transfers. Patient with decreased sitting balance and is unable to safely stand functionally. Patient eager to go home and has refused our recommended SNF placement.   Equipment: drop arm BSC  Reasons for discharge: treatment goals met and discharge from hospital  Patient/family agrees with progress made and goals achieved: Yes  Precautions/Restrictions  Precautions Precautions: Fall Precaution Comments: h/o  multiple falls Restrictions Weight Bearing Restrictions: No  Vital Signs Therapy Vitals Temp: 98.1 F (36.7 C) Temp Source: Oral Pulse Rate: 78 Resp: 18 BP: 136/74 mmHg Patient Position (if appropriate): Lying Oxygen Therapy SpO2: 98 % O2 Device: Not Delivered  ADL - See FIM  Vision/Perception  Vision- History Baseline Vision/History: Wears glasses Wears Glasses: Reading only Patient Visual Report: Blurring of vision Vision- Assessment Vision Assessment?: No apparent visual deficits   Cognition Overall Cognitive Status: Within Functional Limits for tasks assessed Arousal/Alertness: Awake/alert Orientation Level: Oriented X4 Memory: Appears intact Awareness: Impaired Problem Solving: Appears intact Behaviors: Impulsive Safety/Judgment: Impaired   Sensation Sensation Light Touch: Impaired Detail Light Touch Impaired Details: Impaired RUE;Impaired LUE Additional Comments: Impaired in stocking glove distribution bilaterally Coordination Gross Motor Movements are Fluid and Coordinated: No Fine Motor Movements are Fluid and Coordinated: No Coordination and Movement Description: Decreased overall fine and gross motor coordination and fluid movements. Patient with ataxic movements throughout body, including BUEs, BLEs, and trunk.   Motor  Motor Motor: Ataxia Motor - Skilled Clinical Observations: Ataxic in trunk, proximal muscle movements   Trunk/Postural Assessment  See PT D/C summary  Balance See PT d/c summary  Extremity/Trunk Assessment RUE Assessment RUE Assessment: Exceptions to [facility] RUE Strength RUE Overall Strength: Deficits (decreased overall strength and endurance, ROM is WFL, ataxia throughout RUE) LUE Assessment LUE Assessment: Exceptions to [facility] LUE Strength LUE Overall Strength: Deficits (decreased overall strength and endurance, ROM is WFL, ataxia throughout LUE)  See FIM for current functional status  Wilbon Obenchain 07/28/2014, 12:48 PM

## 2014-07-28 NOTE — Discharge Instructions (Signed)
Inpatient Rehab Discharge Instructions  [facility] Discharge date and time:  07/28/14  Activities/Precautions/ Functional Status: Activity: activity as tolerated Diet: diabetic diet Wound Care: none needed   Functional status:  ___ No restrictions     ___ Walk up steps independently _X__ 24/7 supervision/assistance   ___ Walk up steps with assistance ___ Intermittent supervision/assistance  ___ Bathe/dress independently ___ Walk with walker     _X__ Bathe/dress with assistance ___ Walk Independently    ___ Shower independently ___ Walk with assistance    ___ Shower with assistance _X__ No alcohol     ___ Return to work/school ________  COMMUNITY REFERRALS UPON DISCHARGE:   Home Health:   PT     OT       Agency:  [facility] Phone:  [phone] Medical Equipment/Items Ordered:  18x18 wheelchair with 18x18 basic cushion; drop arm commode  Agency/Supplier:  [facility]      Phone:  [phone]  GENERAL COMMUNITY RESOURCES FOR PATIENT/FAMILY: Support Groups:  Online GBS support resources  Special Instructions: 1. Note the decrease in dose of Cymbalta and Concerta 2. Do not use Glipizide, Clonazepam, Colchicine or Amlodipine  My questions have been answered and I understand these instructions. I will adhere to these goals and the provided educational materials after my discharge from the hospital.  Patient/Caregiver Signature _______________________________ Date __________  Clinician Signature _______________________________________ Date __________  Please bring this form and your medication list with you to all your follow-up doctor's appointments.

## 2014-07-28 NOTE — Plan of Care (Signed)
Problem: RH Balance Goal: LTG Patient will maintain dynamic standing with ADLs (OT) LTG: Patient will maintain dynamic standing balance with assist during activities of daily living (OT)  Outcome: Not Applicable Date Met:  15/72/62 Goal discharged 12/17 - PC

## 2014-07-28 NOTE — Progress Notes (Signed)
Physical Therapy Discharge Summary  Patient Details  Name: Frank [name] MRN: [id number] Date of Birth: [birth year]  Patient has met 4 of 9 long term goals due to improved activity tolerance, improved balance, improved postural control, increased strength, decreased pain and improved coordination.  Patient to discharge at a wheelchair level min to mod A.  Pt was approaching S level w/c goals and mod A +2 for gait and stairs last week but has declined since panic attack occurred earlier this week. Patient and patient's care partner, [name] and other caregiver, Beckie Busing still request to d/c home 12/19.  These caregivers are independent to provide the necessary physical assistance at discharge. They completed family education and state that they feel comfortable with providing needed level of A. This therapist has strongly recommended only w/c level mobility for transfers, no gait or standing at this time except with therapy. Pt and caregivers verbalized understanding. This therapist recommends SNF for d/c but pt and caregivers refuse at this time and request to return home.   Reasons goals not met: Pt has demonstrated decline in functional mobility this week, requiring fluctuating levels of assist. Pt requires increased A and is inconsistent with mobility A levels due to anxiety, decreased motor control, ataxia, and poor activity tolerance. Pt has met mod I w/c goals for propulsion when using theraband on rims to increase grip, though now without it (per pt request) at times is requiring S for longer distances for encouragement and cueing.  Recommendation:  Patient will benefit from ongoing skilled PT services in skilled nursing facility setting to continue to advance safe functional mobility, address ongoing impairments in strength, coordination, motor control, pain, ataxia, postural control, endurance, gait, mobility, safety, and minimize fall risk. If pt discharges home, recommend follow up HHPT to address  the impairments listed above.  Equipment: 18x18 w/c with basic cushion and standard legrests.  Reasons for discharge: discharge from hospital  Patient/family agrees with progress made and goals achieved: Yes  PT Discharge Precautions/Restrictions Precautions Precautions: Fall Precaution Comments: h/o multiple falls Restrictions Weight Bearing Restrictions: No Cognition Overall Cognitive Status: Impaired/Different from baseline Arousal/Alertness: Awake/alert Orientation Level: Oriented X4 Memory: Appears intact Awareness: Impaired Awareness Impairment: Anticipatory impairment Problem Solving: Appears intact Behaviors: Impulsive;Poor frustration tolerance Safety/Judgment: Impaired Comments: Pt with history of anxiety. This has become a barrier and limitng factor to patient's overall progress. Sensation Sensation Light Touch: Impaired Detail Light Touch Impaired Details: Impaired RUE;Impaired LUE;Impaired RLE;Impaired LLE Proprioception: Impaired Detail Proprioception Impaired Details: Impaired RLE;Impaired LLE Additional Comments: Impaired in stocking glove distribution bilaterally Coordination Gross Motor Movements are Fluid and Coordinated: No Fine Motor Movements are Fluid and Coordinated: No Coordination and Movement Description: Decreased overall fine and gross motor coordination and fluid movements. Patient with ataxic movements throughout body, including BUEs, BLEs, and trunk. Motor  Motor Motor: Abnormal postural alignment and control;Ataxia;Motor impersistence Motor - Skilled Clinical Observations: Ataxic in trunk, proximal muscle movements  Trunk/Postural Assessment  Cervical Assessment Cervical Assessment: Within Functional Limits Thoracic Assessment Thoracic Assessment: Exceptions to [facility] (flexed posture; impaired in standing) Lumbar Assessment Lumbar Assessment: Exceptions to [facility] (posterior tilt in sitting; impaired in standing) Postural Control Postural  Control: Deficits on evaluation Trunk Control: impaired trunk control in seated and standing positions; ataxic  Balance Balance Balance Assessed: Yes Static Sitting Balance Static Sitting - Level of Assistance: 5: Stand by assistance;6: Modified independent (Device/Increase time) Dynamic Sitting Balance Dynamic Sitting - Level of Assistance: 5: Stand by assistance Static Standing Balance Static Standing - Level of Assistance:  4: Min assist;3: Mod assist Dynamic Standing Balance Dynamic Standing - Level of Assistance: 4: Min assist;3: Mod assist;2: Max assist;1: +2 Total assist (fluctuates) Extremity Assessment  RUE Assessment RUE Assessment: Exceptions to [facility] RUE Strength RUE Overall Strength: Deficits (decreased overall strength and endurance, ROM is WFL, ataxia throughout RUE) LUE Assessment LUE Assessment: Exceptions to [facility] LUE Strength LUE Overall Strength: Deficits (decreased overall strength and endurance, ROM is WFL, ataxia throughout LUE) RLE Assessment RLE Assessment: Exceptions to [facility] RLE Strength RLE Overall Strength: Deficits RLE Overall Strength Comments: hip and knee 4/5; ankle DF 3-/5 and PF 3/5 LLE Assessment LLE Assessment: Exceptions to [facility] LLE Strength LLE Overall Strength: Deficits LLE Overall Strength Comments: hip and knee 4-/5; ankle DF 3-/5 and PF 3/5  See FIM for current functional status  [name] [facility] 07/28/2014, 1:32 PM

## 2014-07-29 ENCOUNTER — Other Ambulatory Visit: Payer: Self-pay | Admitting: Physical Medicine and Rehabilitation

## 2014-07-29 LAB — GLUCOSE, CAPILLARY
Glucose-Capillary: 96 mg/dL (ref 70–99)
Glucose-Capillary: 103 mg/dL — ABNORMAL HIGH (ref 70–99)

## 2014-07-29 NOTE — Progress Notes (Signed)
Frank [name] is a 58 y.o. male 03-09-1956 [id number]  Subjective: No new complaints. No new problems. Slept well. Feeling OK.  Objective: Vital signs in last 24 hours: Temp:  [97.7 F (36.5 C)-98.3 F (36.8 C)] 98.3 F (36.8 C) (12/19 0629) Pulse Rate:  [74-108] 74 (12/19 0629) Resp:  [18-20] 20 (12/19 0629) BP: (121-165)/(75-78) 165/78 mmHg (12/19 0629) SpO2:  [97 %-98 %] 97 % (12/19 0629) Weight change:  Last BM Date: 07/26/14  Intake/Output from previous day: 12/18 0701 - 12/19 0700 In: 1200 [P.O.:1200] Out: 2675 [Urine:2675] Last cbgs: CBG (last 3)   Recent Labs  07/28/14 1707 07/28/14 2043 07/29/14 0717  GLUCAP 124* 104* 96     Physical Exam General: No apparent distress   HEENT: not dry Lungs: Normal effort. Lungs clear to auscultation, no crackles or wheezes. Cardiovascular: Regular rate and rhythm, no edema Abdomen: S/NT/ND; BS(+) Musculoskeletal:  unchanged Neurological: No new neurological deficits, ataxic Wounds: N/A    Skin: clear  Aging changes Mental state: Alert, oriented, cooperative    Lab Results: BMET    Component Value Date/Time   NA 137 07/28/2014 1714   K 3.6* 07/28/2014 1714   CL 98 07/28/2014 1714   CO2 29 07/28/2014 1714   GLUCOSE 99 07/28/2014 1714   BUN 14 07/28/2014 1714   CREATININE 0.80 07/28/2014 1714   CALCIUM 9.4 07/28/2014 1714   GFRNONAA >90 07/28/2014 1714   GFRAA >90 07/28/2014 1714   CBC    Component Value Date/Time   WBC 4.5 07/28/2014 1714   WBC 8.5 12/30/2013 1537   RBC 3.89* 07/28/2014 1714   RBC 4.62* 12/30/2013 1537   HGB 11.6* 07/28/2014 1714   HGB 13.8* 12/30/2013 1537   HCT 34.1* 07/28/2014 1714   HCT 43.0* 12/30/2013 1537   PLT 208 07/28/2014 1714   MCV 87.7 07/28/2014 1714   MCV 93.0 12/30/2013 1537   MCH 29.8 07/28/2014 1714   MCH 29.9 12/30/2013 1537   MCHC 34.0 07/28/2014 1714   MCHC 32.1 12/30/2013 1537   RDW 12.9 07/28/2014 1714   LYMPHSABS 2.3 07/28/2014 1714   MONOABS 0.4  07/28/2014 1714   EOSABS 0.1 07/28/2014 1714   BASOSABS 0.0 07/28/2014 1714    Studies/Results: Dg Chest 2 View  07/28/2014   CLINICAL DATA:  Acute onset of shortness of breath and weakness for 2-3 days. Initial encounter.  EXAM: CHEST  2 VIEW  COMPARISON:  Chest radiograph performed 07/05/2014  FINDINGS: The lungs are well-aerated. Minimal left basilar opacity likely reflects atelectasis. There is no evidence of pleural effusion or pneumothorax.  The heart is normal in size; the mediastinal contour is within normal limits. No acute osseous abnormalities are seen.  IMPRESSION: Minimal left basilar airspace opacity likely reflects atelectasis; lungs otherwise clear.   Electronically Signed   By: Garald Balding M.D.   On: 07/28/2014 22:22    Medications: I have reviewed the patient's current medications.  Assessment/Plan:  1. Functional deficits secondary to Guillain Barre Syndrome (vs CIDP) in patient with chronic peipheral neuropathy 2. DVT Prophylaxis/Anticoagulation: Pharmaceutical: Lovenox 3. Chronic Pain Management: Was on nucynta 100 mg tid PTA.--can resume at dc  -substituted oxycodone for nucynta while here  -lyrica now tid and cymbalta 4. Anxiety/depression/Mood: Continue Cymbalta for mood stabilization. Will continue ativan po prn for anxiety. LCSW to follow for evaluation and support.  -resumed concerta (lower than his presumed home dose for now) 5. Neuropsych: This patient is capable of making decisions on his own behalf. 6. Skin/Wound Care:  Routine pressure relief measures 7. Fluids/Electrolytes/Nutrition: Monitor I/O. BMET within normal limits today 8. HTN: Blood pressure controlled off medications.  9. Peripheral neuropathy: Continue Cymbalta.  -increased lyrica to tid 10. DM type 2: May need glucotrol resumed as po intake improves.  -for now sugars are controlled 11. Anemia: hgb up to 11.3  10  OSA: no new issues 11. GERD s/p recent dilatation of esophageal stricture:  -increased pepcid to 40mg  bid with improvement 12. C diff colitis: Flagyl through 12/13 (14 days completed) --no more loose stools  -ID now saying he needs 30 days after treatment to clear from precautions  13. Urinary frequency: somewhat improved -UTI: 100k E Coli - keflex completed  D/c home  Length of stay, days: 18  Walker Kehr , MD 07/29/2014, 8:24 AM

## 2014-07-29 NOTE — Progress Notes (Signed)
NIF: -45, VC: 600

## 2014-07-29 NOTE — Progress Notes (Signed)
Patient discharge to home with Frank [name] and family member via wheelchair.  Discharge instruction and prescription given. Patient /caregiver Frank [name]) verbalized understanding. Patient stable. No signs and symptoms of respiratory distress noted.

## 2014-07-29 NOTE — Progress Notes (Signed)
Subjective: Patient stable overnight.  No further worsening noted.    Objective: Current vital signs: BP 165/78 mmHg  Pulse 74  Temp(Src) 98.3 F (36.8 C) (Oral)  Resp 20  Ht 5\' 9"  (1.753 m)  Wt 77.3 kg (170 lb 6.7 oz)  BMI 25.15 kg/m2  SpO2 97% Vital signs in last 24 hours: Temp:  [97.7 F (36.5 C)-98.3 F (36.8 C)] 98.3 F (36.8 C) (12/19 0629) Pulse Rate:  [74-108] 74 (12/19 0629) Resp:  [18-20] 20 (12/19 0629) BP: (121-165)/(75-78) 165/78 mmHg (12/19 0629) SpO2:  [97 %-98 %] 97 % (12/19 0629)  Intake/Output from previous day: 12/18 0701 - 12/19 0700 In: 1200 [P.O.:1200] Out: 2675 [Urine:2675] Intake/Output this shift: Total I/O In: 240 [P.O.:240] Out: 300 [Urine:300] Nutritional status: Diet Carb Modified  Neurologic Exam: Mental Status: Alert, oriented, thought content appropriate. Speech fluent without evidence of aphasia. Able to follow 3 step commands without difficulty. Cranial Nerves: II: Visual fields grossly normal, pupils equal, round, reactive to light and accommodation III,IV, VI: ptosis not present, extra-ocular motions intact bilaterally V,VII: smile symmetric, facial light touch sensation normal bilaterally VIII: hearing normal bilaterally IX,X: gag reflex present XI: bilateral shoulder shrug XII: midline tongue extension without atrophy or fasciculations  Motor: Right :Upper extremity 4/5Left: Upper extremity 4/5 Lower extremity 5/5Lower extremity 5/5 --distal LE in PF and DF shows 4/5 strength.  --during exam patients strength is effort dependent and if distracted shows above strength.  Tone and bulk:normal tone throughout; no atrophy noted Sensory: Initially states he could not feel his LE but after noxious stimuli given he states he could feel both LT and PP bilaterally LE. UE sensation intact.  Deep Tendon Reflexes:  1+  bilateral UE and no KJ or AJ noted.  Plantars: Mute bilaterally Cerebellar: normal finger-to-nose, normal heel-to-shin test  Lab Results: Basic Metabolic Panel:  Recent Labs Lab 07/28/14 1714  NA 137  K 3.6*  CL 98  CO2 29  GLUCOSE 99  BUN 14  CREATININE 0.80  CALCIUM 9.4    Liver Function Tests: No results for input(s): AST, ALT, ALKPHOS, BILITOT, PROT, ALBUMIN in the last 168 hours. No results for input(s): LIPASE, AMYLASE in the last 168 hours. No results for input(s): AMMONIA in the last 168 hours.  CBC:  Recent Labs Lab 07/28/14 1714  WBC 4.5  NEUTROABS 1.7  HGB 11.6*  HCT 34.1*  MCV 87.7  PLT 208    Cardiac Enzymes: No results for input(s): CKTOTAL, CKMB, CKMBINDEX, TROPONINI in the last 168 hours.  Lipid Panel: No results for input(s): CHOL, TRIG, HDL, CHOLHDL, VLDL, LDLCALC in the last 168 hours.  CBG:  Recent Labs Lab 07/28/14 0647 07/28/14 1154 07/28/14 1707 07/28/14 2043 07/29/14 0717  GLUCAP 121* 105* 124* 104* 96    Microbiology: Results for orders placed or performed during the hospital encounter of 07/11/14  Culture, Urine     Status: None   Collection Time: 07/14/14  9:21 AM  Result Value Ref Range Status   Specimen Description URINE, CATHETERIZED  Final   Special Requests NONE  Final   Culture  Setup Time   Final    07/14/2014 10:14 Performed at [place]   Final    >=100,000 COLONIES/ML Performed at [place] Performed at [facility]    Report Status 07/16/2014 FINAL  Final   Organism ID, Bacteria ESCHERICHIA COLI  Final  Susceptibility   Escherichia coli - MIC*    AMPICILLIN >=32 RESISTANT Resistant     CEFAZOLIN <=4 SENSITIVE Sensitive     CEFTRIAXONE <=1 SENSITIVE Sensitive     CIPROFLOXACIN <=0.25 SENSITIVE Sensitive     GENTAMICIN <=1 SENSITIVE Sensitive     LEVOFLOXACIN <=0.12 SENSITIVE Sensitive     NITROFURANTOIN  <=16 SENSITIVE Sensitive     TOBRAMYCIN <=1 SENSITIVE Sensitive     TRIMETH/SULFA <=20 SENSITIVE Sensitive     PIP/TAZO <=4 SENSITIVE Sensitive     * ESCHERICHIA COLI    Coagulation Studies: No results for input(s): LABPROT, INR in the last 72 hours.  Imaging: Dg Chest 2 View  07/28/2014   CLINICAL DATA:  Acute onset of shortness of breath and weakness for 2-3 days. Initial encounter.  EXAM: CHEST  2 VIEW  COMPARISON:  Chest radiograph performed 07/05/2014  FINDINGS: The lungs are well-aerated. Minimal left basilar opacity likely reflects atelectasis. There is no evidence of pleural effusion or pneumothorax.  The heart is normal in size; the mediastinal contour is within normal limits. No acute osseous abnormalities are seen.  IMPRESSION: Minimal left basilar airspace opacity likely reflects atelectasis; lungs otherwise clear.   Electronically Signed   By: Garald Balding M.D.   On: 07/28/2014 22:22    Medications:  I have reviewed the patient's current medications. Scheduled: . antiseptic oral rinse  7 mL Mouth Rinse BID  . chlorhexidine  15 mL Mouth/Throat BID  . DULoxetine  30 mg Oral Daily  . enoxaparin (LOVENOX) injection  40 mg Subcutaneous Q24H  . famotidine  40 mg Oral BID  . feeding supplement (RESOURCE BREEZE)  1 Container Oral Daily  . insulin aspart  0-9 Units Subcutaneous TID WC  . LORazepam  0.5 mg Oral BID  . methylphenidate  36 mg Oral Daily  . polyethylene glycol  17 g Oral Daily  . pregabalin  200 mg Oral TID  . senna-docusate  2 tablet Oral BID    Assessment/Plan: GB patient felt to have some worsening of symptoms yesterday.  Stable overnight.  Much improved from initial presentation.  No additional complaints today.  NIF and VC have been stable.    Recommendations: 1.  Continue therapy   LOS: 18 days   Alexis Goodell, MD [facility] [phone] 07/29/2014  9:47 AM

## 2014-07-31 ENCOUNTER — Inpatient Hospital Stay (HOSPITAL_COMMUNITY): Payer: BC Managed Care – PPO

## 2014-07-31 ENCOUNTER — Encounter (HOSPITAL_COMMUNITY): Payer: BC Managed Care – PPO

## 2014-07-31 NOTE — Progress Notes (Signed)
Social Work Discharge Note  The overall goal for the admission was met for:   Discharge location: Yes - home  Length of Stay: Yes - 18 days  Discharge activity level: No, due to pt's decline in condition - min to mod assist at w/c level  Home/community participation: Yes  Services provided included: MD, RD, PT, OT, SLP, RN, Pharmacy, Neuropsych and SW  Financial Services: Private Insurance: [employer]  Follow-up services arranged: Home Health: PT and OT from [facility] and DME: 18x18 lightweight w/c; 18x18 basic cushion; drop arm commode  Comments (or additional information):  Therapy team recommended that pt go to a SNF at d/c, but he and partner remained adamant that pt go home.  They hired a caregiver to be with pt while [name] is at work.  She received family education and did well working with pt.  Patient/Family verbalized understanding of follow-up arrangements: Yes  Individual responsible for coordination of the follow-up plan: pt's partner, Danne Baxter  Confirmed correct DME delivered: Trey Sailors 07/31/2014    Darwin Guastella, Silvestre Mesi

## 2014-08-01 ENCOUNTER — Emergency Department (HOSPITAL_COMMUNITY): Payer: BC Managed Care – PPO

## 2014-08-01 ENCOUNTER — Inpatient Hospital Stay (HOSPITAL_COMMUNITY)
Admission: EM | Admit: 2014-08-01 | Discharge: 2014-08-09 | DRG: 094 | Disposition: A | Discharge status: Home Health | Payer: BC Managed Care – PPO | Attending: Internal Medicine | Admitting: Internal Medicine

## 2014-08-01 ENCOUNTER — Ambulatory Visit (INDEPENDENT_AMBULATORY_CARE_PROVIDER_SITE_OTHER): Payer: BC Managed Care – PPO | Admitting: Internal Medicine

## 2014-08-01 ENCOUNTER — Telehealth: Payer: Self-pay | Admitting: Internal Medicine

## 2014-08-01 ENCOUNTER — Encounter (HOSPITAL_COMMUNITY): Payer: Self-pay

## 2014-08-01 ENCOUNTER — Encounter: Payer: Self-pay | Admitting: Internal Medicine

## 2014-08-01 VITALS — BP 128/88 | HR 108 | Temp 98.2°F

## 2014-08-01 DIAGNOSIS — G8929 Other chronic pain: Secondary | ICD-10-CM | POA: Diagnosis present

## 2014-08-01 DIAGNOSIS — G610 Guillain-Barre syndrome: Secondary | ICD-10-CM

## 2014-08-01 DIAGNOSIS — Z79899 Other long term (current) drug therapy: Secondary | ICD-10-CM | POA: Diagnosis not present

## 2014-08-01 DIAGNOSIS — I10 Essential (primary) hypertension: Secondary | ICD-10-CM | POA: Diagnosis present

## 2014-08-01 DIAGNOSIS — R0602 Shortness of breath: Secondary | ICD-10-CM | POA: Diagnosis present

## 2014-08-01 DIAGNOSIS — E785 Hyperlipidemia, unspecified: Secondary | ICD-10-CM | POA: Diagnosis present

## 2014-08-01 DIAGNOSIS — R0902 Hypoxemia: Secondary | ICD-10-CM

## 2014-08-01 DIAGNOSIS — R0600 Dyspnea, unspecified: Secondary | ICD-10-CM

## 2014-08-01 DIAGNOSIS — F419 Anxiety disorder, unspecified: Secondary | ICD-10-CM | POA: Diagnosis present

## 2014-08-01 DIAGNOSIS — I959 Hypotension, unspecified: Secondary | ICD-10-CM | POA: Diagnosis not present

## 2014-08-01 DIAGNOSIS — Z91013 Allergy to seafood: Secondary | ICD-10-CM | POA: Diagnosis not present

## 2014-08-01 DIAGNOSIS — G4733 Obstructive sleep apnea (adult) (pediatric): Secondary | ICD-10-CM | POA: Diagnosis present

## 2014-08-01 DIAGNOSIS — E876 Hypokalemia: Secondary | ICD-10-CM | POA: Diagnosis present

## 2014-08-01 DIAGNOSIS — Z8249 Family history of ischemic heart disease and other diseases of the circulatory system: Secondary | ICD-10-CM | POA: Diagnosis not present

## 2014-08-01 DIAGNOSIS — K219 Gastro-esophageal reflux disease without esophagitis: Secondary | ICD-10-CM | POA: Diagnosis present

## 2014-08-01 DIAGNOSIS — Z885 Allergy status to narcotic agent status: Secondary | ICD-10-CM | POA: Diagnosis not present

## 2014-08-01 DIAGNOSIS — Z79891 Long term (current) use of opiate analgesic: Secondary | ICD-10-CM | POA: Diagnosis not present

## 2014-08-01 DIAGNOSIS — D649 Anemia, unspecified: Secondary | ICD-10-CM | POA: Diagnosis present

## 2014-08-01 DIAGNOSIS — Z833 Family history of diabetes mellitus: Secondary | ICD-10-CM | POA: Diagnosis not present

## 2014-08-01 DIAGNOSIS — Z7982 Long term (current) use of aspirin: Secondary | ICD-10-CM

## 2014-08-01 DIAGNOSIS — Z888 Allergy status to other drugs, medicaments and biological substances status: Secondary | ICD-10-CM | POA: Diagnosis not present

## 2014-08-01 DIAGNOSIS — E119 Type 2 diabetes mellitus without complications: Secondary | ICD-10-CM | POA: Diagnosis present

## 2014-08-01 DIAGNOSIS — J9690 Respiratory failure, unspecified, unspecified whether with hypoxia or hypercapnia: Secondary | ICD-10-CM

## 2014-08-01 DIAGNOSIS — J9691 Respiratory failure, unspecified with hypoxia: Secondary | ICD-10-CM | POA: Diagnosis present

## 2014-08-01 DIAGNOSIS — R10813 Right lower quadrant abdominal tenderness: Secondary | ICD-10-CM

## 2014-08-01 HISTORY — DX: Guillain-Barre syndrome: G61.0

## 2014-08-01 LAB — CBC WITH DIFFERENTIAL/PLATELET
Basophils Absolute: 0 10*3/uL (ref 0.0–0.1)
Basophils Relative: 0 % (ref 0–1)
EOS ABS: 0.1 10*3/uL (ref 0.0–0.7)
EOS PCT: 1 % (ref 0–5)
HCT: 40.7 % (ref 39.0–52.0)
Hemoglobin: 13.8 g/dL (ref 13.0–17.0)
LYMPHS ABS: 2.3 10*3/uL (ref 0.7–4.0)
Lymphocytes Relative: 37 % (ref 12–46)
MCH: 30.4 pg (ref 26.0–34.0)
MCHC: 33.9 g/dL (ref 30.0–36.0)
MCV: 89.6 fL (ref 78.0–100.0)
Monocytes Absolute: 0.7 10*3/uL (ref 0.1–1.0)
Monocytes Relative: 12 % (ref 3–12)
Neutro Abs: 3.2 10*3/uL (ref 1.7–7.7)
Neutrophils Relative %: 50 % (ref 43–77)
PLATELETS: 272 10*3/uL (ref 150–400)
RBC: 4.54 MIL/uL (ref 4.22–5.81)
RDW: 12.8 % (ref 11.5–15.5)
WBC: 6.3 10*3/uL (ref 4.0–10.5)

## 2014-08-01 LAB — BASIC METABOLIC PANEL
Anion gap: 10 (ref 5–15)
BUN: 21 mg/dL (ref 6–23)
CO2: 23 mmol/L (ref 19–32)
Calcium: 9.8 mg/dL (ref 8.4–10.5)
Chloride: 103 mEq/L (ref 96–112)
Creatinine, Ser: 0.85 mg/dL (ref 0.50–1.35)
GFR calc Af Amer: >90 mL/min (ref >90)
GLUCOSE: 107 mg/dL — AB (ref 70–99)
Potassium: 4 mmol/L (ref 3.5–5.1)
Sodium: 136 mmol/L (ref 135–145)

## 2014-08-01 LAB — CBG MONITORING, ED: Glucose-Capillary: 99 mg/dL (ref 70–99)

## 2014-08-01 LAB — I-STAT TROPONIN, ED: TROPONIN I, POC: 0 ng/mL (ref 0.00–0.08)

## 2014-08-01 MED ORDER — VANCOMYCIN HCL IN DEXTROSE 1-5 GM/200ML-% IV SOLN: 1000.0000 mg | INTRAVENOUS | Status: DC

## 2014-08-01 MED ORDER — CEFEPIME HCL 1 G IJ SOLR
1.0000 g | Freq: Once | INTRAMUSCULAR | Status: DC
  Filled 2014-08-01: qty 1

## 2014-08-01 MED ORDER — LORAZEPAM 2 MG/ML IJ SOLN
0.5000 mg | Freq: Once | INTRAMUSCULAR | Status: AC
  Administered 2014-08-01: 0.5 mg via INTRAVENOUS
  Filled 2014-08-01: qty 1

## 2014-08-01 MED ORDER — VANCOMYCIN HCL IN DEXTROSE 1-5 GM/200ML-% IV SOLN
1000.0000 mg | Freq: Three times a day (TID) | INTRAVENOUS | Status: DC
Start: 2014-08-02 — End: 2014-08-01

## 2014-08-01 MED ORDER — IOHEXOL 350 MG/ML SOLN
100.0000 mL | Freq: Once | INTRAVENOUS | Status: AC | PRN
  Administered 2014-08-01: 100 mL via INTRAVENOUS

## 2014-08-01 MED ORDER — MORPHINE SULFATE 2 MG/ML IJ SOLN
2.0000 mg | Freq: Once | INTRAMUSCULAR | Status: AC
  Administered 2014-08-01: 2 mg via INTRAVENOUS
  Filled 2014-08-01: qty 1

## 2014-08-01 NOTE — Telephone Encounter (Signed)
Ok for verbal for PT

## 2014-08-01 NOTE — Progress Notes (Signed)
Forced Vital Capacity performed with Pt giving excellent effort and repeatable results.  Results from forced VC were 1.45 L, 1.43 L, and 1.44 L.

## 2014-08-01 NOTE — Patient Instructions (Signed)
I recommend an  ER consultation to  Optimally assess shortness of breath & low oxygen levels

## 2014-08-01 NOTE — ED Notes (Addendum)
Partner, [name] Work: [phone] Home: [phone] Cell: [phone]

## 2014-08-01 NOTE — Consult Note (Signed)
[facility] Medical Consultation  Dannis Deroche JJH:[id number] DOB: Feb 28, 1956 DOA: 08/01/2014 PCP: Cathlean Cower, MD   Requesting physician: EDP Date of consultation: 08/01/14 Reason for consultation: SOB  Impression/Recommendations Active Problems:   Recurrent GBS (Guillain-Barre syndrome)    1. Relapse of GBS - The lack of clear CT chest findings of PNA to explain his hypoxia, degree of dyspnea and accessory muscle use, neurologic findings, lack of fever or WBC: together make PNA a less likely alternative. 1. Dr. Janann Colonel recommends that patient be transferred to neuro ICU at [facility] given possible need for intubation in near future with respiratory compromise. 1. He recommends no repeat LP at this time since he intends to treat patient for GBS regardless due to very high suspicion. 2. Dr. Janann Colonel should be paged on patients arrival to Neuro ICU to evaluate patient and if appropriate initiate treatment for GBS. 2. Dr. Melvyn Novas has accepted patient to neuro ICU and PCCM should be paged on arrival, he requested patient be put on bipap before being sent over. 3. Given that patients breathing is even worse (per family) than it was last time he was admitted, I suspect he will wind up intubated in the next day or so. 4. Had long discussion with patient on this, I DO feel that given the relatively good prognosis and long term recovery from GBS in this previously healthy individual, that full code is appropriate for this patient.  Had a long discussion with patient and family, he agreed to be full code at this time.    Chief Complaint: SOB  HPI:  [name] is a 58 yo M who was admitted with Guillian-Barre syndrome (GBS) to [facility] on 06/29/14.  Patient required intubation on 06/30/14 for respiratory failure and was extubated on 07/05/14.  He was treated with plasma exchange while there for his GBS.  His hospital course was complicated by aspiration PNA, and C.Diff.  He was discharged to rehab on  12/1.  In rehab he states he was progressing, and had improved back to walking, until 3-4 days prior to discharge from rehab (discharge from rehab was this past sat) when he re developed the symptoms of ascending motor paryalasis, muscle weakness, and SOB.  At home his symptoms continued to worsen and so he re-presents to the ED.  No fever, no cough, no productive cough, has pre-existing and long standing (20 years) peripheral neuropathy involving BLE loss of sensation.  This is unchanged.  The motor weakness is new and is identical to the progression of symptoms with diagnosis of GBS from last month he says.  Review of Systems:  12 systems reviewed and otherwise negative.  Past Medical History  Diagnosis Date  . COLONIC POLYPS 02/01/2008  . DIABETES MELLITUS, TYPE II 05/20/2010  . HYPERLIPIDEMIA 10/09/2008  . ANXIETY DEPRESSION 02/01/2008  . ERECTILE DYSFUNCTION 10/09/2008  . ADD 10/09/2008  . SLEEP APNEA, OBSTRUCTIVE 02/01/2008  . MORTON'S NEUROMA 05/20/2010  . PERIPHERAL NEUROPATHY 05/20/2010  . Other specified forms of hearing loss 06/27/2009  . HYPERTENSION 10/09/2008  . HEMORRHOIDS 02/01/2008  . ALLERGIC RHINITIS 10/09/2008  . Stricture and stenosis of esophagus 02/02/2008  . GERD 02/01/2008  . HIATAL HERNIA 02/01/2008  . ERECTILE DYSFUNCTION, ORGANIC 05/20/2010  . WRIST PAIN, LEFT 12/05/2009  . FOOT PAIN, LEFT 05/20/2010  . PERIPHERAL EDEMA 05/20/2010  . DYSPNEA 03/12/2010  . Abdominal pain, unspecified site 01/19/2009  . Abdominal pain, left lower quadrant 06/06/2010  . Type II or unspecified type diabetes mellitus without mention of complication,  uncontrolled 11/14/2010  . Guillain Barr syndrome    Past Surgical History  Procedure Laterality Date  . Carpal tunnel release    . Rotator cuff repair    . Esophageal dilation  july 2009  . Esophagogastroduodenoscopy N/A 06/27/2014    Procedure: ESOPHAGOGASTRODUODENOSCOPY (EGD);  Surgeon: Lafayette Dragon, MD;  Location: [facility];  Service:  Endoscopy;  Laterality: N/A;  . Eye surgery      catract surgery on both eyes   Social History:  reports that he has never smoked. He has never used smokeless tobacco. He reports that he drinks alcohol. He reports that he uses illicit drugs (Methaqualone).  Allergies  Allergen Reactions  . Codeine Itching    REACTION: Itching  . Metformin And Related Other (See Comments)    GI upset, diarrhea  . Nucynta [Tapentadol]     somnolence   Family History  Problem Relation Age of Onset  . Diabetes Mother   . Heart disease Mother   . Hyperlipidemia Mother   . Depression Mother   . Diabetes Brother   . Colon cancer Neg Hx     Prior to Admission medications   Medication Sig Start Date End Date Taking? Authorizing Provider  allopurinol (ZYLOPRIM) 100 MG tablet Take 1 tablet (100 mg total) by mouth daily. 07/28/14  Yes Ivan Anchors Love, PA-C  aspirin EC 81 MG tablet Take 1 tablet (81 mg total) by mouth daily. 07/28/14  Yes Ivan Anchors Love, PA-C  diphenhydramine-acetaminophen (TYLENOL PM) 25-500 MG TABS Take 1 tablet by mouth at bedtime as needed (headache).   Yes Historical Provider, MD  DULoxetine (CYMBALTA) 30 MG capsule Take 30 mg by mouth daily.    Yes Historical Provider, MD  famotidine (PEPCID) 40 MG tablet Take 1 tablet (40 mg total) by mouth 2 (two) times daily. 07/28/14  Yes Ivan Anchors Love, PA-C  LORazepam (ATIVAN) 0.5 MG tablet Take 1 tablet (0.5 mg total) by mouth 2 (two) times daily. 07/28/14  Yes Ivan Anchors Love, PA-C  methocarbamol (ROBAXIN) 500 MG tablet Take 1 tablet (500 mg total) by mouth every 6 (six) hours as needed for muscle spasms. 07/28/14  Yes Ivan Anchors Love, PA-C  methylphenidate 36 MG PO CR tablet Take 1 tablet (36 mg total) by mouth daily. 07/28/14  Yes Ivan Anchors Love, PA-C  oxyCODONE (ROXICODONE) 15 MG immediate release tablet Take 1 tablet (15 mg total) by mouth every 6 (six) hours as needed for severe pain. 07/28/14  Yes Ivan Anchors Love, PA-C  pantoprazole (PROTONIX) 40 MG  tablet Take 1 tablet (40 mg total) by mouth daily. 07/28/14  Yes Ivan Anchors Love, PA-C  polyethylene glycol (MIRALAX / GLYCOLAX) packet Take 17 g by mouth daily. 07/28/14  Yes Ivan Anchors Love, PA-C  pregabalin (LYRICA) 200 MG capsule Take 1 capsule (200 mg total) by mouth 3 (three) times daily. 07/28/14  Yes Ivan Anchors Love, PA-C  senna-docusate (SENOKOT-S) 8.6-50 MG per tablet Take 2 tablets by mouth 2 (two) times daily. 07/28/14  Yes Ivan Anchors Love, PA-C  glucose blood (FREESTYLE TEST STRIPS) test strip Use as instructed 06/04/12   Biagio Borg, MD   Physical Exam: Blood pressure 160/85, pulse 88, resp. rate 11, SpO2 97 %. Filed Vitals:   08/01/14 2116  BP: 160/85  Pulse: 88  Resp: 11    General:  NAD, resting comfortably in bed Eyes: PEERLA EOMI ENT: mucous membranes moist Neck: supple w/o JVD Cardiovascular: RRR w/o MRG Respiratory: CTA B, speaking in 2-3  words only per breath, accessory muscle use is needed to talk. Abdomen: soft, nt, nd, bs+ Skin: no rash nor lesion Musculoskeletal: MAE, full ROM all 4 extremities Psychiatric: normal tone and affect Neurologic: AAOx3, loss of sensation of BLE below knees which patient states is long standing peripheral neuropathy for the past 20 years.  Loss of motor function in BLE, weakness in BUE, diaphragmatic weakness.  Absent patellar reflexes BLE.  Labs on Admission:  Basic Metabolic Panel:  Recent Labs Lab 07/28/14 1714 08/01/14 1654  NA 137 136  K 3.6* 4.0  CL 98 103  CO2 29 23  GLUCOSE 99 107*  BUN 14 21  CREATININE 0.80 0.85  CALCIUM 9.4 9.8   Liver Function Tests: No results for input(s): AST, ALT, ALKPHOS, BILITOT, PROT, ALBUMIN in the last 168 hours. No results for input(s): LIPASE, AMYLASE in the last 168 hours. No results for input(s): AMMONIA in the last 168 hours. CBC:  Recent Labs Lab 07/28/14 1714 08/01/14 1654  WBC 4.5 6.3  NEUTROABS 1.7 3.2  HGB 11.6* 13.8  HCT 34.1* 40.7  MCV 87.7 89.6  PLT 208 272    Cardiac Enzymes: No results for input(s): CKTOTAL, CKMB, CKMBINDEX, TROPONINI in the last 168 hours. BNP: Invalid input(s): POCBNP CBG:  Recent Labs Lab 07/28/14 1707 07/28/14 2043 07/29/14 0717 07/29/14 1134 08/01/14 2035  GLUCAP 124* 104* 96 103* 99    Radiological Exams on Admission: Ct Angio Chest Pe W/cm &/or Wo Cm  08/01/2014   CLINICAL DATA:  Shortness of breath, weakness  EXAM: CT ANGIOGRAPHY CHEST WITH CONTRAST  TECHNIQUE: Multidetector CT imaging of the chest was performed using the standard protocol during bolus administration of intravenous contrast. Multiplanar CT image reconstructions and MIPs were obtained to evaluate the vascular anatomy.  CONTRAST:  18mL OMNIPAQUE IOHEXOL 350 MG/ML SOLN  COMPARISON:  Chest radiograph 07/28/2014, chest CT 06/17/2014  FINDINGS: Heart size at upper limits of normal. The great vessels are normal in caliber. Examination is suboptimal for evaluation for pulmonary embolism due to inhomogeneity of the contrast bolus particularly at the lung bases past the level of the third order pulmonary arteries. Allowing for this, no central focal filling defect is identified to suggest pulmonary embolism up to the third order pulmonary arteries. No lymphadenopathy. No pericardial or pleural effusion.  Minimal dependent bibasilar atelectasis is present. More focal curvilinear left lower lobe consolidation with air bronchogram formation could represent atelectasis although early pneumonia could appear similar. No pulmonary nodule or mass. Central airways are patent. Degenerative change noted in the spine.  Review of the MIP images confirms the above findings.  IMPRESSION: Bibasilar dependent atelectasis with more focal linear probable atelectasis at the left lower lobe, although early pneumonia could appear similar.  Allowing for technique, no evidence for acute pulmonary embolism up to the third order pulmonary arteries.   Electronically Signed   By: Conchita Paris M.D.   On: 08/01/2014 18:22    EKG: Independently reviewed.  Time spent: 120 min  GARDNER, JARED M. [facility] Pager [phone]  If 7PM-7AM, please contact night-coverage [email or web address] Password [facility] 08/01/2014, 9:37 PM

## 2014-08-01 NOTE — Progress Notes (Signed)
   Subjective:    Patient ID: Frank [name], male    DOB: Feb 09, 1956, 58 y.o.   MRN: [id number]  HPI  He presents with progressive weakness and dyspnea. His complicated Hospital 19/41-74/0/81 & Rehab course 12/1-12/18/15  were reviewed.  During the rehabilitation stay his O2 sats were in the range of 98-99%.  He was admitted with GBS with impending respiratory failure with aspiration pneumonia which required mechanical ventilation. GBS was treated by Neurology with IVIG. His inpatient  course included C. difficile colitis which was treated to resolution. He also was treated for anxiety & paranoia.  Prior to discharge he was having decreasing ability to walk using a walker. He was also unable to push himself in a wheelchair.  Symptoms have progressed since he left rehabilitation  He describes left lower quadrant &  right upper quadrant pain which is apparently chronic phenomena.  Review of Systems  Since discharge he is not having fever, chills, or sweats  He denies diarrhea at this time; he was being treated for constipation  He denies melena or rectal bleeding.     Objective:   Physical Exam  Pertinent or positive findings include: He appears profoundly weak. He sits slumped in a wheelchair.Affect is markedly flat. Voice is muffled. Pattern alopecia is present Breath sounds are decreased; there is no definite  increased work of breathing He exhibits a resting tachycardia without significant murmur He is exquisitely tender to deep palpation of the right upper quadrant; he has less tenderness in left lower quadrant. I can appreciate no lymphadenopathy about the head, neck, or axilla. There is no peripheral edema. Pedal pulses are intact.  General appearance :adequately nourished; in no actual distress but appearance as noted. Eyes: No conjunctival inflammation or scleral icterus is present. Heart:  S1 and S2 normal without click, rub or other extra sounds   Abdomen: bowel  sounds normal, soft  without masses, organomegaly or hernias noted.   Vascular : all pulses equal ; no bruits present. Skin:Warm & dry.  Intact without suspicious lesions or rashes ; no jaundice or tenting            Assessment & Plan:  #1 progressive weakness in the context of known Guillain-Barr syndrome  #2 dyspnea with documented hypoxemia. This is significantly lower than at the time of discharge from rehabilitation  #3 chronic abdominal pain without worrisome signs or symptoms.  Plan: Because of the hypoxemia; reevaluation in the emergency room would be indicated.

## 2014-08-01 NOTE — ED Notes (Addendum)
Per partner, pt was recently released from [facility] ICU.  Pt has had increased shortness of breath since d/c home on Saturday.  Went to [facility] and sent here.  Guillain Barre Syndrom

## 2014-08-01 NOTE — Progress Notes (Addendum)
ANTIBIOTIC CONSULT NOTE - INITIAL  Pharmacy Consult for Vancomycin Indication: HCAP  Allergies  Allergen Reactions  . Codeine Itching    REACTION: Itching  . Metformin And Related Other (See Comments)    GI upset, diarrhea  . Nucynta [Tapentadol]     somnolence    Patient Measurements:   Adjusted Body Weight:   Vital Signs: Temp: 98.2 F (36.8 C) (12/22 1516) Temp Source: Oral (12/22 1516) BP: 169/82 mmHg (12/22 1834) Pulse Rate: 83 (12/22 1834) Intake/Output from previous day:   Intake/Output from this shift:    Labs:  Recent Labs  08/01/14 1654  WBC 6.3  HGB 13.8  PLT 272  CREATININE 0.85   Estimated Creatinine Clearance: 92.4 mL/min (by C-G formula based on Cr of 0.85). No results for input(s): VANCOTROUGH, VANCOPEAK, VANCORANDOM, GENTTROUGH, GENTPEAK, GENTRANDOM, TOBRATROUGH, TOBRAPEAK, TOBRARND, AMIKACINPEAK, AMIKACINTROU, AMIKACIN in the last 72 hours.   Microbiology: Recent Results (from the past 720 hour(s))  Clostridium Difficile by PCR     Status: Abnormal   Collection Time: 07/10/14 11:18 AM  Result Value Ref Range Status   C difficile by pcr POSITIVE (A) NEGATIVE Final    Comment: CRITICAL RESULT CALLED TO, READ BACK BY AND VERIFIED WITH: VASIADIAS RN 16:50 07/10/14 ([email or web address])   Culture, Urine     Status: None   Collection Time: 07/14/14  9:21 AM  Result Value Ref Range Status   Specimen Description URINE, CATHETERIZED  Final   Special Requests NONE  Final   Culture  Setup Time   Final    07/14/2014 10:14 Performed at [facility]   Final    >=100,000 COLONIES/ML Performed at [facility]    Culture   Final    ESCHERICHIA COLI Performed at [facility]    Report Status 07/16/2014 FINAL  Final   Organism ID, Bacteria ESCHERICHIA COLI  Final      Susceptibility   Escherichia coli - MIC*    AMPICILLIN >=32 RESISTANT Resistant     CEFAZOLIN <=4 SENSITIVE Sensitive     CEFTRIAXONE <=1 SENSITIVE  Sensitive     CIPROFLOXACIN <=0.25 SENSITIVE Sensitive     GENTAMICIN <=1 SENSITIVE Sensitive     LEVOFLOXACIN <=0.12 SENSITIVE Sensitive     NITROFURANTOIN <=16 SENSITIVE Sensitive     TOBRAMYCIN <=1 SENSITIVE Sensitive     TRIMETH/SULFA <=20 SENSITIVE Sensitive     PIP/TAZO <=4 SENSITIVE Sensitive     * ESCHERICHIA COLI    Medical History: Past Medical History  Diagnosis Date  . COLONIC POLYPS 02/01/2008  . DIABETES MELLITUS, TYPE II 05/20/2010  . HYPERLIPIDEMIA 10/09/2008  . ANXIETY DEPRESSION 02/01/2008  . ERECTILE DYSFUNCTION 10/09/2008  . ADD 10/09/2008  . SLEEP APNEA, OBSTRUCTIVE 02/01/2008  . MORTON'S NEUROMA 05/20/2010  . PERIPHERAL NEUROPATHY 05/20/2010  . Other specified forms of hearing loss 06/27/2009  . HYPERTENSION 10/09/2008  . HEMORRHOIDS 02/01/2008  . ALLERGIC RHINITIS 10/09/2008  . Stricture and stenosis of esophagus 02/02/2008  . GERD 02/01/2008  . HIATAL HERNIA 02/01/2008  . ERECTILE DYSFUNCTION, ORGANIC 05/20/2010  . WRIST PAIN, LEFT 12/05/2009  . FOOT PAIN, LEFT 05/20/2010  . PERIPHERAL EDEMA 05/20/2010  . DYSPNEA 03/12/2010  . Abdominal pain, unspecified site 01/19/2009  . Abdominal pain, left lower quadrant 06/06/2010  . Type II or unspecified type diabetes mellitus without mention of complication, uncontrolled 11/14/2010  . Guillain Barr syndrome     Assessment: 76 yoM with recent hospitalization for guillain Barre syndrome presents with  worsening SOB, weakness, and dyspnea. Pt treated for aspiration PNA during last admission.  CTA chest shows possible early PNA. Pharmacy consulted to start Vancomycin for HCAP.    12/22 >> Cefepime  >> 12/22 >> Vancomcyin  >>    Tmax: Afebrile WBCs: WNL Renal: SCr 0.85, CrCl ~92 (N and CG)  Drug level / dose changes info: 11/16: VT 16.7 on Vanc 1g q8h with stable renal fxn (SCr 0.7-0.8)  Goal of Therapy:  Vancomycin trough level 15-20 mcg/ml  Plan:  Vancomycin 1g IV q8h F/u renal fxn, VT, clinical course  Ralene Bathe, PharmD, BCPS 08/01/2014, 8:01 PM  Pager: [phone]

## 2014-08-01 NOTE — ED Notes (Signed)
Patient transported to CT 

## 2014-08-01 NOTE — Progress Notes (Signed)
Pt placed on BiPAP continuous to assist with ventilation due to Guillian Bare.  Pt placed on BiPAP with settings of IPAP-10, EPAP-5, FiO2-30%, BUR-8, i-time- 1.0 sec, and rise of 2. Pt stable and comfortable.

## 2014-08-01 NOTE — Telephone Encounter (Signed)
HHRN informed 

## 2014-08-01 NOTE — ED Provider Notes (Signed)
CSN: [id number]     Arrival date & time 08/01/14  1620 History   First MD Initiated Contact with Patient 08/01/14 1627     Chief Complaint  Patient presents with  . Shortness of Breath     (Consider location/radiation/quality/duration/timing/severity/associated sxs/prior Treatment) HPI Comments: Patient presents today with a chief complaint of SOB.  He reports that the SOB has been present for the past 6 days and is gradually worsening.  He was recently discharged from physical rehab to home 3 days ago.  He was recently hospitalized in the Neuro ICU on 06/30/14 for Guillain Barre Syndrome.  During that time he was intubated and treated with plasma exchange.  He was extubated on 07/05/14 and respiratory status was stable.  He was discharged to rehab on 07/11/14.  He was discharged from rehab 3 days ago.  He also reports that during physical rehab he regained the ability to walk.  However, six days ago he again became weak and has not been able to ambulate.  He is again in a wheelchair.  Patient and partner report that the weakness has progressively worsened.   He denies fever, chills, chest pain, nausea, vomiting, diarrhea, or urinary symptoms.  He has had an occasional cough, but no hemoptysis.  Denies new numbness or tingling, but does have Neuropathy at baseline.  He does reports that he does feel bloated.  Last BM was two days ago.  He has chronic abdominal pain.  Abdominal pain is the same pain that he has had in the past.    Patient is a 58 y.o. male presenting with shortness of breath. The history is provided by the patient.  Shortness of Breath   Past Medical History  Diagnosis Date  . COLONIC POLYPS 02/01/2008  . DIABETES MELLITUS, TYPE II 05/20/2010  . HYPERLIPIDEMIA 10/09/2008  . ANXIETY DEPRESSION 02/01/2008  . ERECTILE DYSFUNCTION 10/09/2008  . ADD 10/09/2008  . SLEEP APNEA, OBSTRUCTIVE 02/01/2008  . MORTON'S NEUROMA 05/20/2010  . PERIPHERAL NEUROPATHY 05/20/2010  . Other specified  forms of hearing loss 06/27/2009  . HYPERTENSION 10/09/2008  . HEMORRHOIDS 02/01/2008  . ALLERGIC RHINITIS 10/09/2008  . Stricture and stenosis of esophagus 02/02/2008  . GERD 02/01/2008  . HIATAL HERNIA 02/01/2008  . ERECTILE DYSFUNCTION, ORGANIC 05/20/2010  . WRIST PAIN, LEFT 12/05/2009  . FOOT PAIN, LEFT 05/20/2010  . PERIPHERAL EDEMA 05/20/2010  . DYSPNEA 03/12/2010  . Abdominal pain, unspecified site 01/19/2009  . Abdominal pain, left lower quadrant 06/06/2010  . Type II or unspecified type diabetes mellitus without mention of complication, uncontrolled 11/14/2010  . Guillain Barr syndrome    Past Surgical History  Procedure Laterality Date  . Carpal tunnel release    . Rotator cuff repair    . Esophageal dilation  july 2009  . Esophagogastroduodenoscopy N/A 06/27/2014    Procedure: ESOPHAGOGASTRODUODENOSCOPY (EGD);  Surgeon: Lafayette Dragon, MD;  Location: [facility];  Service: Endoscopy;  Laterality: N/A;  . Eye surgery      catract surgery on both eyes   Family History  Problem Relation Age of Onset  . Diabetes Mother   . Heart disease Mother   . Hyperlipidemia Mother   . Depression Mother   . Diabetes Brother   . Colon cancer Neg Hx    History  Substance Use Topics  . Smoking status: Never Smoker   . Smokeless tobacco: Never Used  . Alcohol Use: Yes     Comment: occasional    Review of Systems  Respiratory: Positive  for shortness of breath.   Neurological: Positive for weakness.  All other systems reviewed and are negative.     Allergies  Codeine; Metformin and related; and Nucynta  Home Medications   Prior to Admission medications   Medication Sig Start Date End Date Taking? Authorizing Provider  allopurinol (ZYLOPRIM) 100 MG tablet Take 1 tablet (100 mg total) by mouth daily. 07/28/14  Yes Ivan Anchors Love, PA-C  aspirin EC 81 MG tablet Take 1 tablet (81 mg total) by mouth daily. 07/28/14  Yes Ivan Anchors Love, PA-C  diphenhydramine-acetaminophen (TYLENOL PM)  25-500 MG TABS Take 1 tablet by mouth at bedtime as needed (headache).   Yes Historical Provider, MD  DULoxetine (CYMBALTA) 30 MG capsule Take 30 mg by mouth daily.    Yes Historical Provider, MD  famotidine (PEPCID) 40 MG tablet Take 1 tablet (40 mg total) by mouth 2 (two) times daily. 07/28/14  Yes Ivan Anchors Love, PA-C  LORazepam (ATIVAN) 0.5 MG tablet Take 1 tablet (0.5 mg total) by mouth 2 (two) times daily. 07/28/14  Yes Ivan Anchors Love, PA-C  methocarbamol (ROBAXIN) 500 MG tablet Take 1 tablet (500 mg total) by mouth every 6 (six) hours as needed for muscle spasms. 07/28/14  Yes Ivan Anchors Love, PA-C  methylphenidate 36 MG PO CR tablet Take 1 tablet (36 mg total) by mouth daily. 07/28/14  Yes Ivan Anchors Love, PA-C  oxyCODONE (ROXICODONE) 15 MG immediate release tablet Take 1 tablet (15 mg total) by mouth every 6 (six) hours as needed for severe pain. 07/28/14  Yes Ivan Anchors Love, PA-C  pantoprazole (PROTONIX) 40 MG tablet Take 1 tablet (40 mg total) by mouth daily. 07/28/14  Yes Ivan Anchors Love, PA-C  polyethylene glycol (MIRALAX / GLYCOLAX) packet Take 17 g by mouth daily. 07/28/14  Yes Ivan Anchors Love, PA-C  pregabalin (LYRICA) 200 MG capsule Take 1 capsule (200 mg total) by mouth 3 (three) times daily. 07/28/14  Yes Ivan Anchors Love, PA-C  senna-docusate (SENOKOT-S) 8.6-50 MG per tablet Take 2 tablets by mouth 2 (two) times daily. 07/28/14  Yes Ivan Anchors Love, PA-C  glucose blood (FREESTYLE TEST STRIPS) test strip Use as instructed 06/04/12   Biagio Borg, MD   BP 160/85 mmHg  Pulse 88  Resp 11  SpO2 97% Physical Exam  Constitutional: He appears well-developed and well-nourished.  HENT:  Head: Normocephalic and atraumatic.  Mouth/Throat: Oropharynx is clear and moist.  Neck: Normal range of motion. Neck supple.  Cardiovascular: Normal rate, regular rhythm and normal heart sounds.   Pulmonary/Chest: Tachypnea noted. He has decreased breath sounds. He has no wheezes. He has no rhonchi. He has no  rales.  Musculoskeletal: Normal range of motion.  Neurological: He is alert.  Reflex Scores:      Brachioradialis reflexes are 0 on the right side and 0 on the left side.      Patellar reflexes are 0 on the right side and 0 on the left side.      Achilles reflexes are 0 on the right side and 0 on the left side. Skin: Skin is warm and dry.  Psychiatric: He has a normal mood and affect.  Nursing note and vitals reviewed.   ED Course  Procedures (including critical care time) Labs Review Labs Reviewed  CBC WITH DIFFERENTIAL  BASIC METABOLIC PANEL  I-STAT TROPOININ, ED    Imaging Review Ct Angio Chest Pe W/cm &/or Wo Cm  08/01/2014   CLINICAL DATA:  Shortness of breath, weakness  EXAM: CT ANGIOGRAPHY CHEST WITH CONTRAST  TECHNIQUE: Multidetector CT imaging of the chest was performed using the standard protocol during bolus administration of intravenous contrast. Multiplanar CT image reconstructions and MIPs were obtained to evaluate the vascular anatomy.  CONTRAST:  1109mL OMNIPAQUE IOHEXOL 350 MG/ML SOLN  COMPARISON:  Chest radiograph 07/28/2014, chest CT 06/17/2014  FINDINGS: Heart size at upper limits of normal. The great vessels are normal in caliber. Examination is suboptimal for evaluation for pulmonary embolism due to inhomogeneity of the contrast bolus particularly at the lung bases past the level of the third order pulmonary arteries. Allowing for this, no central focal filling defect is identified to suggest pulmonary embolism up to the third order pulmonary arteries. No lymphadenopathy. No pericardial or pleural effusion.  Minimal dependent bibasilar atelectasis is present. More focal curvilinear left lower lobe consolidation with air bronchogram formation could represent atelectasis although early pneumonia could appear similar. No pulmonary nodule or mass. Central airways are patent. Degenerative change noted in the spine.  Review of the MIP images confirms the above findings.   IMPRESSION: Bibasilar dependent atelectasis with more focal linear probable atelectasis at the left lower lobe, although early pneumonia could appear similar.  Allowing for technique, no evidence for acute pulmonary embolism up to the third order pulmonary arteries.   Electronically Signed   By: Conchita Paris M.D.   On: 08/01/2014 18:22   Dg Chest Port 1 View  08/03/2014   CLINICAL DATA:  Respiratory failure.  EXAM: PORTABLE CHEST - 1 VIEW  COMPARISON:  CT 08/01/2014.  Chest x-ray 07/28/2014.  FINDINGS: Mediastinum and hilar structures are normal. Left lung base subsegmental atelectasis and or infiltrate again noted.No pleural effusion pneumothorax. Heart size normal. No acute bony abnormality.  IMPRESSION: Persistent left base subsegmental atelectasis and or infiltrate.   Electronically Signed   By: Marcello Moores  Register   On: 08/03/2014 07:29     EKG Interpretation   Date/Time:  Tuesday August 01 2014 16:33:27 EST Ventricular Rate:  98 PR Interval:  152 QRS Duration: 119 QT Interval:  359 QTC Calculation: 458 R Axis:   -17 Text Interpretation:  Sinus rhythm Nonspecific intraventricular conduction  delay ED PHYSICIAN INTERPRETATION AVAILABLE IN [facility] Confirmed  by TEST, Record ([id number]) on 08/03/2014 7:52:07 AM      8:00 PM Discussed with Dr. Alcario Drought with [facility].  He will come evaluate the patient in the ED.  8:20 PM Discussed with Dr. Janann Colonel with Neurology.  He recommends getting another LP.   8:30 PM Discussed with Dr. Janann Colonel again.  He recommends holding off on the LP.  He recommends transfer to [facility].  He states that he will evaluate the patient once he is at [facility].  CRITICAL CARE Performed by: Hyman Bible   Total critical care time: 30  Critical care time was exclusive of separately billable procedures and treating other patients.  Critical care was necessary to treat or prevent imminent or life-threatening deterioration.  Critical care  was time spent personally by me on the following activities: development of treatment plan with patient and/or surrogate as well as nursing, discussions with consultants, evaluation of patient's response to treatment, examination of patient, obtaining history from patient or surrogate, ordering and performing treatments and interventions, ordering and review of laboratory studies, ordering and review of radiographic studies, pulse oximetry and re-evaluation of patient's condition.  MDM   Final diagnoses:  SOB (shortness of breath)   Patient with a history of recently diagnosed GBS presents today  with a chief complaint of SOB and hypoxia.  He has not been ambulatory over the past 6 days.  Initial concern was for a PE.  CT angio chest was ordered, which did not show evidence of PE, but possible early Pneumonia.  Symptoms do not correlate with a Pneumonia diagnosis.  He reports very little cough and no fever.  No leukocytosis on CBC.  Due to the fact that he is SOB and the weakness has also been progressing concern for relapse of GBS.  DTR's absent.  Discussed case with [facility] and also Dr. Janann Colonel with Neuro ICU. Dr. Janann Colonel recommends transfer of the patient to [facility] for additional monitoring and treatment with IVIG for GBS relapse.  Patient admitted by Critical Care at [facility].  Patient also evaluated by Dr. Jeneen Rinks in the ED.    Hyman Bible, PA-C 08/03/14 [place], MD 08/10/14 [phone]

## 2014-08-01 NOTE — Telephone Encounter (Signed)
Following up on verbal order for physical therapy for 3x week for 4 weeks Requesting verbal order

## 2014-08-01 NOTE — ED Notes (Signed)
RT at bedside.

## 2014-08-01 NOTE — ED Provider Notes (Signed)
Patient seen in evaluation. Discussed with Hyman Bible PA-C.  patient recently discharged from the hospital to short-term rehabilitation to home after an episode of bradycardia syndrome. Increasing shortness of breath over the last 24 hours. On exam he does have excessive muscle use although he is not tachypneic does not appear fatigued or distress. He has some subtle crackles at bilateral bases. Workup shows no PE atelectasis versus infiltrate inferiorly. Concern is for that of recurrent proximal weakness. He will be transferred to [facility]. Observed in neuro ICU. Seen by hospitalist. Arrangements made for transfer.  Tanna Furry, MD 08/01/14 2226

## 2014-08-01 NOTE — Progress Notes (Signed)
Pre visit review using our clinic review tool, if applicable. No additional management support is needed unless otherwise documented below in the visit note. 

## 2014-08-01 NOTE — ED Notes (Signed)
Patient was given a urinal and assistance trying to use the bathroom, but was unable, will try again at a later point in time.

## 2014-08-02 DIAGNOSIS — F411 Generalized anxiety disorder: Secondary | ICD-10-CM

## 2014-08-02 DIAGNOSIS — J9620 Acute and chronic respiratory failure, unspecified whether with hypoxia or hypercapnia: Secondary | ICD-10-CM

## 2014-08-02 DIAGNOSIS — G610 Guillain-Barre syndrome: Principal | ICD-10-CM

## 2014-08-02 DIAGNOSIS — G894 Chronic pain syndrome: Secondary | ICD-10-CM

## 2014-08-02 LAB — URINALYSIS, ROUTINE W REFLEX MICROSCOPIC
BILIRUBIN URINE: NEGATIVE
Glucose, UA: NEGATIVE mg/dL
Hgb urine dipstick: NEGATIVE
Ketones, ur: NEGATIVE mg/dL
Leukocytes, UA: NEGATIVE
Nitrite: NEGATIVE
PH: 7 (ref 5.0–8.0)
Protein, ur: NEGATIVE mg/dL
Urobilinogen, UA: 0.2 mg/dL (ref 0.0–1.0)

## 2014-08-02 LAB — BLOOD GAS, ARTERIAL
ACID-BASE EXCESS: 0.3 mmol/L (ref 0.0–2.0)
BICARBONATE: 24.7 meq/L — AB (ref 20.0–24.0)
DRAWN BY: 31276
Delivery systems: POSITIVE
Expiratory PAP: 5
FIO2: 0.3 %
INSPIRATORY PAP: 10
Mode: POSITIVE
O2 Saturation: 96.5 %
Patient temperature: 98.6
TCO2: 26 mmol/L (ref 0–100)
pCO2 arterial: 42 mmHg (ref 35.0–45.0)
pH, Arterial: 7.388 (ref 7.350–7.450)
pO2, Arterial: 89.4 mmHg (ref 80.0–100.0)

## 2014-08-02 LAB — CBC
HEMATOCRIT: 38.4 % — AB (ref 39.0–52.0)
Hemoglobin: 12.7 g/dL — ABNORMAL LOW (ref 13.0–17.0)
MCH: 29.2 pg (ref 26.0–34.0)
MCHC: 33.1 g/dL (ref 30.0–36.0)
MCV: 88.3 fL (ref 78.0–100.0)
PLATELETS: 248 10*3/uL (ref 150–400)
RBC: 4.35 MIL/uL (ref 4.22–5.81)
RDW: 12.8 % (ref 11.5–15.5)
WBC: 5.2 10*3/uL (ref 4.0–10.5)

## 2014-08-02 LAB — MAGNESIUM: Magnesium: 2 mg/dL (ref 1.5–2.5)

## 2014-08-02 LAB — BASIC METABOLIC PANEL
Anion gap: 11 (ref 5–15)
BUN: 19 mg/dL (ref 6–23)
CO2: 24 mmol/L (ref 19–32)
Calcium: 9.2 mg/dL (ref 8.4–10.5)
Chloride: 100 mEq/L (ref 96–112)
Creatinine, Ser: 0.94 mg/dL (ref 0.50–1.35)
GFR calc Af Amer: >90 mL/min (ref >90)
GFR, EST NON AFRICAN AMERICAN: 89 mL/min — AB (ref >90)
Glucose, Bld: 98 mg/dL (ref 70–99)
Potassium: 3.7 mmol/L (ref 3.5–5.1)
SODIUM: 135 mmol/L (ref 135–145)

## 2014-08-02 LAB — IGA: IgA: 169 mg/dL (ref 68–379)

## 2014-08-02 LAB — PHOSPHORUS: Phosphorus: 4.8 mg/dL — ABNORMAL HIGH (ref 2.3–4.6)

## 2014-08-02 MED ORDER — LORAZEPAM 2 MG/ML IJ SOLN
INTRAMUSCULAR | Status: AC
  Filled 2014-08-02: qty 1

## 2014-08-02 MED ORDER — LORAZEPAM 2 MG/ML IJ SOLN
1.0000 mg | Freq: Four times a day (QID) | INTRAMUSCULAR | Status: DC | PRN
  Administered 2014-08-02 – 2014-08-08 (×14): 1 mg via INTRAVENOUS
  Filled 2014-08-02 (×13): qty 1

## 2014-08-02 MED ORDER — FAMOTIDINE IN NACL 20-0.9 MG/50ML-% IV SOLN
20.0000 mg | Freq: Two times a day (BID) | INTRAVENOUS | Status: DC
  Administered 2014-08-02 – 2014-08-07 (×10): 20 mg via INTRAVENOUS
  Filled 2014-08-02 (×11): qty 50

## 2014-08-02 MED ORDER — HEPARIN SODIUM (PORCINE) 5000 UNIT/ML IJ SOLN
5000.0000 [IU] | Freq: Three times a day (TID) | INTRAMUSCULAR | Status: DC
  Administered 2014-08-02 – 2014-08-09 (×21): 5000 [IU] via SUBCUTANEOUS
  Filled 2014-08-02 (×25): qty 1

## 2014-08-02 MED ORDER — ACETAMINOPHEN 325 MG PO TABS
650.0000 mg | ORAL_TABLET | Freq: Once | ORAL | Status: AC | PRN
Start: 2014-08-02 — End: 2014-08-02

## 2014-08-02 MED ORDER — SODIUM CHLORIDE 0.9 % IV SOLN
INTRAVENOUS | Status: DC
  Administered 2014-08-02 – 2014-08-03 (×3): via INTRAVENOUS

## 2014-08-02 MED ORDER — IMMUNE GLOBULIN (HUMAN) 10 GM/200ML IV SOLN
400.0000 mg/kg | INTRAVENOUS | Status: AC
  Administered 2014-08-02 – 2014-08-06 (×5): 30 g via INTRAVENOUS
  Filled 2014-08-02 (×5): qty 600

## 2014-08-02 MED ORDER — CHLORHEXIDINE GLUCONATE 0.12 % MT SOLN
15.0000 mL | Freq: Two times a day (BID) | OROMUCOSAL | Status: DC
  Administered 2014-08-02 – 2014-08-09 (×13): 15 mL via OROMUCOSAL
  Filled 2014-08-02 (×13): qty 15

## 2014-08-02 MED ORDER — DIPHENHYDRAMINE HCL 50 MG/ML IJ SOLN
25.0000 mg | Freq: Once | INTRAMUSCULAR | Status: AC
  Administered 2014-08-02: 25 mg via INTRAVENOUS
  Filled 2014-08-02: qty 1

## 2014-08-02 MED ORDER — FENTANYL CITRATE 0.05 MG/ML IJ SOLN
50.0000 ug | INTRAMUSCULAR | Status: DC | PRN
  Administered 2014-08-02 – 2014-08-05 (×16): 50 ug via INTRAVENOUS
  Filled 2014-08-02 (×16): qty 2

## 2014-08-02 MED ORDER — SODIUM CHLORIDE 0.9 % IV SOLN: 250.0000 mL | INTRAVENOUS | Status: DC | PRN

## 2014-08-02 MED ORDER — FAMOTIDINE 20 MG PO TABS: 20.0000 mg | ORAL_TABLET | Freq: Every day | ORAL | Status: DC

## 2014-08-02 MED ORDER — CETYLPYRIDINIUM CHLORIDE 0.05 % MT LIQD
7.0000 mL | Freq: Two times a day (BID) | OROMUCOSAL | Status: DC
  Administered 2014-08-02 – 2014-08-09 (×14): 7 mL via OROMUCOSAL

## 2014-08-02 MED ORDER — FAMOTIDINE 40 MG PO TABS
40.0000 mg | ORAL_TABLET | Freq: Every day | ORAL | Status: DC
  Filled 2014-08-02: qty 1

## 2014-08-02 NOTE — Progress Notes (Signed)
Sedated, unable to do nif and vc

## 2014-08-02 NOTE — Progress Notes (Signed)
Pt seen by partner Dr. Janann Colonel earlier this am. GBS with re-worsening. Getting IVIG. Appreciate pulmonary assistance. He is in no distress and awakens easily. Distal LE weakness. Areflexic.   Roland Rack, MD [facility] [phone]  If 7pm- 7am, please page neurology on call as listed in [place].

## 2014-08-02 NOTE — Progress Notes (Signed)
NIF -34 and VC 1.4L, Pt demonstrated with good technique and effort.  RT to monitor and assess as needed.

## 2014-08-02 NOTE — H&P (Signed)
PULMONARY / CRITICAL CARE MEDICINE   Name: Frank [name] MRN: [id number] DOB: 02/25/1956    ADMISSION DATE:  08/01/2014 CONSULTATION DATE:  08/02/2014  REFERRING MD :  EDP  CHIEF COMPLAINT:  SOB  INITIAL PRESENTATION:  58 y.o. M with hx of GBS, recently discharged after prolonged hospitalization for GBS where he required intubation.  Brought to [facility] ED 12/22 for SOB and recurrent muscle weakness.  He was started on BiPAP and transferred to [facility] neuro ICU at request of neurology.  PCCM called for admission.   STUDIES:  CTA chest 12/22 >>> bibasilar atx, neg for PE  SIGNIFICANT EVENTS: 12/22 - admitted to [facility] neuro ICU for probable GBS flare   HISTORY OF PRESENT ILLNESS:  Pt is on BiPAP and is unable to speak comfortably; therefore, this HPI is obtained from chart review. Frank [name] is a 58 y.o. M with PMH as outlined below which includes GBS.  He was recently admitted to [facility] 11/19 and required intubation 11/20 as well as PLEX.  He was extubated 11/25.  He was then discharged to rehab on 12/1 before being discharged home 12/19. Apparently, he was progressing in rehab and got back to walking fairly well.  3 - 4 days prior to discharge home, he re-developed symptoms of GBS flare. While he was home, his symptoms worsened prompting him to go to ED for further evaluation on 12/22.  His main complaint at the time was SOB.  He denied any cough, fevers/chills/sweats.  He has also continued to have motor weakness and states that this is similar to the last GBS flare he had. Neurology was consulted and they requested that pt be transferred to [facility] neuro ICU.  He was placed on BiPAP prior to transfer and PCCM was called for admission.   PAST MEDICAL HISTORY :   has a past medical history of COLONIC POLYPS (02/01/2008); DIABETES MELLITUS, TYPE II (05/20/2010); HYPERLIPIDEMIA (10/09/2008); ANXIETY DEPRESSION (02/01/2008); ERECTILE DYSFUNCTION (10/09/2008); ADD (10/09/2008); SLEEP APNEA, OBSTRUCTIVE (02/01/2008);  MORTON'S NEUROMA (05/20/2010); PERIPHERAL NEUROPATHY (05/20/2010); Other specified forms of hearing loss (06/27/2009); HYPERTENSION (10/09/2008); HEMORRHOIDS (02/01/2008); ALLERGIC RHINITIS (10/09/2008); Stricture and stenosis of esophagus (02/02/2008); GERD (02/01/2008); HIATAL HERNIA (02/01/2008); ERECTILE DYSFUNCTION, ORGANIC (05/20/2010); WRIST PAIN, LEFT (12/05/2009); FOOT PAIN, LEFT (05/20/2010); PERIPHERAL EDEMA (05/20/2010); DYSPNEA (03/12/2010); Abdominal pain, unspecified site (01/19/2009); Abdominal pain, left lower quadrant (06/06/2010); Type II or unspecified type diabetes mellitus without mention of complication, uncontrolled (11/14/2010); and Guillain Barr syndrome.  has past surgical history that includes Carpal tunnel release; Rotator cuff repair; Esophageal dilation (july 2009); Esophagogastroduodenoscopy (N/A, 06/27/2014); and Eye surgery. Prior to Admission medications   Medication Sig Start Date End Date Taking? Authorizing Provider  allopurinol (ZYLOPRIM) 100 MG tablet Take 1 tablet (100 mg total) by mouth daily. 07/28/14  Yes Ivan Anchors Love, PA-C  aspirin EC 81 MG tablet Take 1 tablet (81 mg total) by mouth daily. 07/28/14  Yes Ivan Anchors Love, PA-C  diphenhydramine-acetaminophen (TYLENOL PM) 25-500 MG TABS Take 1 tablet by mouth at bedtime as needed (headache).   Yes Historical Provider, MD  DULoxetine (CYMBALTA) 30 MG capsule Take 30 mg by mouth daily.    Yes Historical Provider, MD  famotidine (PEPCID) 40 MG tablet Take 1 tablet (40 mg total) by mouth 2 (two) times daily. 07/28/14  Yes Ivan Anchors Love, PA-C  LORazepam (ATIVAN) 0.5 MG tablet Take 1 tablet (0.5 mg total) by mouth 2 (two) times daily. 07/28/14  Yes Ivan Anchors Love, PA-C  methocarbamol (ROBAXIN) 500 MG tablet Take 1  tablet (500 mg total) by mouth every 6 (six) hours as needed for muscle spasms. 07/28/14  Yes Ivan Anchors Love, PA-C  methylphenidate 36 MG PO CR tablet Take 1 tablet (36 mg total) by mouth daily. 07/28/14  Yes Ivan Anchors Love,  PA-C  oxyCODONE (ROXICODONE) 15 MG immediate release tablet Take 1 tablet (15 mg total) by mouth every 6 (six) hours as needed for severe pain. 07/28/14  Yes Ivan Anchors Love, PA-C  pantoprazole (PROTONIX) 40 MG tablet Take 1 tablet (40 mg total) by mouth daily. 07/28/14  Yes Ivan Anchors Love, PA-C  polyethylene glycol (MIRALAX / GLYCOLAX) packet Take 17 g by mouth daily. 07/28/14  Yes Ivan Anchors Love, PA-C  pregabalin (LYRICA) 200 MG capsule Take 1 capsule (200 mg total) by mouth 3 (three) times daily. 07/28/14  Yes Ivan Anchors Love, PA-C  senna-docusate (SENOKOT-S) 8.6-50 MG per tablet Take 2 tablets by mouth 2 (two) times daily. 07/28/14  Yes Ivan Anchors Love, PA-C  glucose blood (FREESTYLE TEST STRIPS) test strip Use as instructed 06/04/12   Biagio Borg, MD   Allergies  Allergen Reactions  . Codeine Itching    REACTION: Itching  . Metformin And Related Other (See Comments)    GI upset, diarrhea  . Nucynta [Tapentadol]     somnolence    FAMILY HISTORY:  Family History  Problem Relation Age of Onset  . Diabetes Mother   . Heart disease Mother   . Hyperlipidemia Mother   . Depression Mother   . Diabetes Brother   . Colon cancer Neg Hx     SOCIAL HISTORY:  reports that he has never smoked. He has never used smokeless tobacco. He reports that he drinks alcohol. He reports that he uses illicit drugs (Methaqualone).  REVIEW OF SYSTEMS:   All negative; except for those that are bolded, which indicate positives.  Constitutional: weight loss, weight gain, night sweats, fevers, chills, fatigue, weakness.  HEENT: headaches, sore throat, sneezing, nasal congestion, post nasal drip, difficulty swallowing, tooth/dental problems, visual complaints, visual changes, ear aches. Neuro: difficulty with speech, weakness, numbness, ataxia. CV:  chest pain, orthopnea, PND, swelling in lower extremities, dizziness, palpitations, syncope.  Resp: cough, hemoptysis, dyspnea, wheezing. GI  heartburn, indigestion,  abdominal pain, nausea, vomiting, diarrhea, constipation, change in bowel habits, loss of appetite, hematemesis, melena, hematochezia.  GU: dysuria, change in color of urine, urgency or frequency, flank pain, hematuria. MSK: joint pain or swelling, decreased range of motion. Psych: change in mood or affect, depression, anxiety, suicidal ideations, homicidal ideations. Skin: rash, itching, bruising.   SUBJECTIVE:  SOB much improved on BiPAP.  VITAL SIGNS: Temp:  [98.2 F (36.8 C)-98.3 F (36.8 C)] 98.3 F (36.8 C) (12/23 0135) Pulse Rate:  [68-108] 68 (12/23 0135) Resp:  [11-24] 18 (12/23 0135) BP: (128-169)/(76-88) 148/76 mmHg (12/23 0135) SpO2:  [88 %-99 %] 98 % (12/23 0135) HEMODYNAMICS:   VENTILATOR SETTINGS:   INTAKE / OUTPUT: Intake/Output    None     PHYSICAL EXAMINATION: General: Elderly male, in NAD. Neuro: A&O x 3, non-focal. Bilateral LE weakness. HEENT: [place]/AT. PERRL, sclerae anicteric.  BiPAP mask in place. Cardiovascular: RRR, no M/R/G.  Lungs: Respirations shallow, unlabored.  Diminished BS, no W/R/R. Abdomen: BS x 4, soft, NT/ND.  Musculoskeletal: No gross deformities, no edema.  Skin: Intact, warm, no rashes.  LABS:  CBC  Recent Labs Lab 07/28/14 1714 08/01/14 1654  WBC 4.5 6.3  HGB 11.6* 13.8  HCT 34.1* 40.7  PLT 208 272  Coag's No results for input(s): APTT, INR in the last 168 hours. BMET  Recent Labs Lab 07/28/14 1714 08/01/14 1654  NA 137 136  K 3.6* 4.0  CL 98 103  CO2 29 23  BUN 14 21  CREATININE 0.80 0.85  GLUCOSE 99 107*   Electrolytes  Recent Labs Lab 07/28/14 1714 08/01/14 1654  CALCIUM 9.4 9.8   Sepsis Markers No results for input(s): LATICACIDVEN, PROCALCITON, O2SATVEN in the last 168 hours. ABG No results for input(s): PHART, PCO2ART, PO2ART in the last 168 hours. Liver Enzymes No results for input(s): AST, ALT, ALKPHOS, BILITOT, ALBUMIN in the last 168 hours. Cardiac Enzymes No results for input(s):  TROPONINI, PROBNP in the last 168 hours. Glucose  Recent Labs Lab 07/28/14 1154 07/28/14 1707 07/28/14 2043 07/29/14 0717 07/29/14 1134 08/01/14 2035  GLUCAP 105* 124* 104* 96 103* 99    Imaging Ct Angio Chest Pe W/cm &/or Wo Cm  08/01/2014   CLINICAL DATA:  Shortness of breath, weakness  EXAM: CT ANGIOGRAPHY CHEST WITH CONTRAST  TECHNIQUE: Multidetector CT imaging of the chest was performed using the standard protocol during bolus administration of intravenous contrast. Multiplanar CT image reconstructions and MIPs were obtained to evaluate the vascular anatomy.  CONTRAST:  174mL OMNIPAQUE IOHEXOL 350 MG/ML SOLN  COMPARISON:  Chest radiograph 07/28/2014, chest CT 06/17/2014  FINDINGS: Heart size at upper limits of normal. The great vessels are normal in caliber. Examination is suboptimal for evaluation for pulmonary embolism due to inhomogeneity of the contrast bolus particularly at the lung bases past the level of the third order pulmonary arteries. Allowing for this, no central focal filling defect is identified to suggest pulmonary embolism up to the third order pulmonary arteries. No lymphadenopathy. No pericardial or pleural effusion.  Minimal dependent bibasilar atelectasis is present. More focal curvilinear left lower lobe consolidation with air bronchogram formation could represent atelectasis although early pneumonia could appear similar. No pulmonary nodule or mass. Central airways are patent. Degenerative change noted in the spine.  Review of the MIP images confirms the above findings.  IMPRESSION: Bibasilar dependent atelectasis with more focal linear probable atelectasis at the left lower lobe, although early pneumonia could appear similar.  Allowing for technique, no evidence for acute pulmonary embolism up to the third order pulmonary arteries.   Electronically Signed   By: Conchita Paris M.D.   On: 08/01/2014 18:22    ASSESSMENT / PLAN:  NEUROLOGIC A:   Probable GBS flare /  recurrent GBS P:   Neuro following. IVIG per neuro. Hold outpatient cymbalta, ativan, robaxin, methylphenidate, oxycodone, lyrica.  PULMONARY A: Dyspnea - secondary to GBS OSA P:   Continue BiPAP for now. If unable to tolerate or declines at all, will need intubation. Q4hr NIF's & vital capacity's.  CARDIOVASCULAR A:  Hx HTN P:  Monitor hemodynamics.  RENAL A:   No acute issues P:   NS @ 100. Replace electrolytes as indicated. BMP in AM.  GASTROINTESTINAL A:   GERD Nutrition P:   Pepcid. NPO for now.  HEMATOLOGIC A:   No acute issues VTE Prophylaxis P:  SCD's / Heparin. CBC in AM.  INFECTIOUS A:   No indication of infection P:   Monitor clinically.  ENDOCRINE A:   No known issues P:   SSI if glucose consistently > 180.  Family updated: None  Interdisciplinary Family Meeting v Palliative Care Meeting:  Due by: 12/29.  Montey Hora, [place] [facility] Pgr: [phone] -  [phone]  or [phone] 08/02/2014, 1:55 AM  Patient is very anxious, able to breath well on BiPAP, do not anticipate need for ETT for now.  Will continue to follow NIF and VC, will hold in ICU for monitoring, intubate if deteriorates, IVIG per neuro.  NPO for now.  Narcs and benzos ordered via IV due to BiPAP and GBS.  The patient is critically ill with multiple organ systems failure and requires high complexity decision making for assessment and support, frequent evaluation and titration of therapies, application of advanced monitoring technologies and extensive interpretation of multiple databases.   Critical Care Time devoted to patient care services described in this note is  35  Minutes. This time reflects time of care of this signee Dr Jennet Maduro. This critical care time does not reflect procedure time, or teaching time or supervisory time of PA/NP/Med student/Med Resident etc but could involve care discussion time.  Rush Farmer,  M.D. [facility] Pulmonary/Critical Care Medicine. Pager: [phone]. After hours pager: [phone].

## 2014-08-02 NOTE — Progress Notes (Signed)
At this time patient does not require Enteric Precautions, should not be re-tested for C.Diff by pcr unless patient has 3 diarrhea stools within 24 hours.  Case discussed with Dr. Baxter Flattery ID.   Luther Hearing RN BSN

## 2014-08-02 NOTE — Consult Note (Signed)
Consult Reason for Consult:increasing weakness in [name] patient Referring Physician: Dr Alcario Drought  CC: increasing weakness and hypoxia  HPI: Frank [name] is an 58 y.o. male who was admitted with Guillian-Barre syndrome ([name]) [facility] 06[facility]. Patient required intubation on 06/30/14 for respiratory failure and was extubated on 07/05/14. He was treated with plasma exchange while there for his [name]. His hospital course was complicated by aspiration PNA, and C.Diff. He was discharged to rehab on 12/1.  In rehab he states he was progressing, and had improved back to walking, until 3-4 days prior to discharge from rehab (discharge from rehab was this past sat) when he re developed the symptoms of ascending motor paryalasis, muscle weakness, and SOB.  At home his symptoms continued to worsen and so he re-presents to the ED. No fever, no cough, no productive cough, has pre-existing and long standing (20 years) peripheral neuropathy involving BLE loss of sensation. This is unchanged. The motor weakness is new and is identical to the progression of symptoms with diagnosis of [name] from last month he says.  Past Medical History  Diagnosis Date  . COLONIC POLYPS 02/01/2008  . DIABETES MELLITUS, TYPE II 05/20/2010  . HYPERLIPIDEMIA 10/09/2008  . ANXIETY DEPRESSION 02/01/2008  . ERECTILE DYSFUNCTION 10/09/2008  . ADD 10/09/2008  . SLEEP APNEA, OBSTRUCTIVE 02/01/2008  . MORTON'S NEUROMA 05/20/2010  . PERIPHERAL NEUROPATHY 05/20/2010  . Other specified forms of hearing loss 06/27/2009  . HYPERTENSION 10/09/2008  . HEMORRHOIDS 02/01/2008  . ALLERGIC RHINITIS 10/09/2008  . Stricture and stenosis of esophagus 02/02/2008  . GERD 02/01/2008  . HIATAL HERNIA 02/01/2008  . ERECTILE DYSFUNCTION, ORGANIC 05/20/2010  . WRIST PAIN, LEFT 12/05/2009  . FOOT PAIN, LEFT 05/20/2010  . PERIPHERAL EDEMA 05/20/2010  . DYSPNEA 03/12/2010  . Abdominal pain, unspecified site 01/19/2009  . Abdominal pain, left lower quadrant  06/06/2010  . Type II or unspecified type diabetes mellitus without mention of complication, uncontrolled 11/14/2010  . Guillain Barr syndrome     Past Surgical History  Procedure Laterality Date  . Carpal tunnel release    . Rotator cuff repair    . Esophageal dilation  july 2009  . Esophagogastroduodenoscopy N/A 06/27/2014    Procedure: ESOPHAGOGASTRODUODENOSCOPY (EGD);  Surgeon: Lafayette Dragon, MD;  Location: [facility];  Service: Endoscopy;  Laterality: N/A;  . Eye surgery      catract surgery on both eyes    Family History  Problem Relation Age of Onset  . Diabetes Mother   . Heart disease Mother   . Hyperlipidemia Mother   . Depression Mother   . Diabetes Brother   . Colon cancer Neg Hx     Social History:  reports that he has never smoked. He has never used smokeless tobacco. He reports that he drinks alcohol. He reports that he uses illicit drugs (Methaqualone).  Allergies  Allergen Reactions  . Codeine Itching    REACTION: Itching  . Metformin And Related Other (See Comments)    GI upset, diarrhea  . Nucynta [Tapentadol]     somnolence    Medications:  Scheduled: . diphenhydrAMINE  25 mg Intravenous Once  . famotidine  20 mg Oral Daily  . heparin  5,000 Units Subcutaneous 3 times per day  . Immune Globulin 5%  400 mg/kg Intravenous Q24 Hr x 5     ROS: Out of a complete 14 system review, the patient complains of only the following symptoms, and all other reviewed systems are negative +weakness, shortness of breath  Physical Examination: Filed Vitals:   08/02/14 0132  BP:   Pulse: 74  Resp: 22   Physical Exam  Constitutional: He appears well-developed and well-nourished.  Psych: Affect appropriate to situation Eyes: No scleral injection HENT: No OP obstrucion Head: Normocephalic.  Cardiovascular: Normal rate and regular rhythm.  Respiratory: Effort normal and breath sounds normal.  GI: Soft. Bowel sounds are normal. No distension. There is  no tenderness.  Skin: WDI  Neurologic Examination Mental Status: Alert, oriented, thought content appropriate.  Speech fluent without evidence of aphasia.  Able to follow 3 step commands without difficulty. Cranial Nerves: II: funduscopic exam wnl bilaterally, visual fields grossly normal, pupils equal, round, reactive to light and accommodation III,IV, VI: ptosis not present, extra-ocular motions intact bilaterally V,VII: smile symmetric, facial light touch sensation normal bilaterally VIII: hearing normal bilaterally IX,X: gag reflex present XI: trapezius strength/neck flexion strength normal bilaterally XII: tongue strength normal  Motor: Right : Upper extremity    Left:     Upper extremity 3/5 deltoid       3/5 deltoid 4/5 biceps      4/5 biceps  4/5 triceps      4/5 triceps 4+/5 hand grip      4+/5 hand grip  Lower extremity     Lower extremity 3/5 hip flexor      3/5 hip flexor 3/5 quadricep      3/5 quadriceps  4-/5 hamstrings     4-/5 hamstrings 4/5 plantar flexion       4/5 plantar flexion 4/5 plantar extension     4/5 plantar extension  Sensory: diminished LT and PP bilateral LE (notes chronic due to peripheral neuropathy) Deep Tendon Reflexes: trace biceps bilaterally, otherwise absent reflexes Plantars: Right: downgoing   Left: downgoing Cerebellar: Unable to test due to weakness Gait: unable to test due to weakness  Laboratory Studies:   Basic Metabolic Panel:  Recent Labs Lab 07/28/14 1714 08/01/14 1654  NA 137 136  K 3.6* 4.0  CL 98 103  CO2 29 23  GLUCOSE 99 107*  BUN 14 21  CREATININE 0.80 0.85  CALCIUM 9.4 9.8    Liver Function Tests: No results for input(s): AST, ALT, ALKPHOS, BILITOT, PROT, ALBUMIN in the last 168 hours. No results for input(s): LIPASE, AMYLASE in the last 168 hours. No results for input(s): AMMONIA in the last 168 hours.  CBC:  Recent Labs Lab 07/28/14 1714 08/01/14 1654  WBC 4.5 6.3  NEUTROABS 1.7 3.2  HGB 11.6*  13.8  HCT 34.1* 40.7  MCV 87.7 89.6  PLT 208 272    Cardiac Enzymes: No results for input(s): CKTOTAL, CKMB, CKMBINDEX, TROPONINI in the last 168 hours.  BNP: Invalid input(s): POCBNP  CBG:  Recent Labs Lab 07/28/14 1707 07/28/14 2043 07/29/14 0717 07/29/14 1134 08/01/14 2035  GLUCAP 124* 104* 96 103* 54    Microbiology: Results for orders placed or performed during the hospital encounter of 07/11/14  Culture, Urine     Status: None   Collection Time: 07/14/14  9:21 AM  Result Value Ref Range Status   Specimen Description URINE, CATHETERIZED  Final   Special Requests NONE  Final   Culture  Setup Time   Final    07/14/2014 10:14 Performed at [place]   Final    >=100,000 COLONIES/ML Performed at [place] Performed at [facility]  Report Status 07/16/2014 FINAL  Final   Organism ID, Bacteria ESCHERICHIA COLI  Final      Susceptibility   Escherichia coli - MIC*    AMPICILLIN >=32 RESISTANT Resistant     CEFAZOLIN <=4 SENSITIVE Sensitive     CEFTRIAXONE <=1 SENSITIVE Sensitive     CIPROFLOXACIN <=0.25 SENSITIVE Sensitive     GENTAMICIN <=1 SENSITIVE Sensitive     LEVOFLOXACIN <=0.12 SENSITIVE Sensitive     NITROFURANTOIN <=16 SENSITIVE Sensitive     TOBRAMYCIN <=1 SENSITIVE Sensitive     TRIMETH/SULFA <=20 SENSITIVE Sensitive     PIP/TAZO <=4 SENSITIVE Sensitive     * ESCHERICHIA COLI    Coagulation Studies: No results for input(s): LABPROT, INR in the last 72 hours.  Urinalysis:  Recent Labs Lab 08/02/14 0025  COLORURINE YELLOW  LABSPEC >1.046*  PHURINE 7.0  GLUCOSEU NEGATIVE  HGBUR NEGATIVE  BILIRUBINUR NEGATIVE  KETONESUR NEGATIVE  PROTEINUR NEGATIVE  UROBILINOGEN 0.2  NITRITE NEGATIVE  LEUKOCYTESUR NEGATIVE    Lipid Panel:     Component Value Date/Time   CHOL 209* 05/04/2014 0900   TRIG 151.0* 05/04/2014 0900   HDL 48.60 05/04/2014 0900    CHOLHDL 4 05/04/2014 0900   VLDL 30.2 05/04/2014 0900   LDLCALC 130* 05/04/2014 0900    HgbA1C:  Lab Results  Component Value Date   HGBA1C 5.9* 06/21/2014    Urine Drug Screen:  No results found for: LABOPIA, COCAINSCRNUR, LABBENZ, AMPHETMU, THCU, LABBARB  Alcohol Level: No results for input(s): ETH in the last 168 hours.   Imaging: Ct Angio Chest Pe W/cm &/or Wo Cm  08/01/2014   CLINICAL DATA:  Shortness of breath, weakness  EXAM: CT ANGIOGRAPHY CHEST WITH CONTRAST  TECHNIQUE: Multidetector CT imaging of the chest was performed using the standard protocol during bolus administration of intravenous contrast. Multiplanar CT image reconstructions and MIPs were obtained to evaluate the vascular anatomy.  CONTRAST:  151mL OMNIPAQUE IOHEXOL 350 MG/ML SOLN  COMPARISON:  Chest radiograph 07/28/2014, chest CT 06/17/2014  FINDINGS: Heart size at upper limits of normal. The great vessels are normal in caliber. Examination is suboptimal for evaluation for pulmonary embolism due to inhomogeneity of the contrast bolus particularly at the lung bases past the level of the third order pulmonary arteries. Allowing for this, no central focal filling defect is identified to suggest pulmonary embolism up to the third order pulmonary arteries. No lymphadenopathy. No pericardial or pleural effusion.  Minimal dependent bibasilar atelectasis is present. More focal curvilinear left lower lobe consolidation with air bronchogram formation could represent atelectasis although early pneumonia could appear similar. No pulmonary nodule or mass. Central airways are patent. Degenerative change noted in the spine.  Review of the MIP images confirms the above findings.  IMPRESSION: Bibasilar dependent atelectasis with more focal linear probable atelectasis at the left lower lobe, although early pneumonia could appear similar.  Allowing for technique, no evidence for acute pulmonary embolism up to the third order pulmonary arteries.    Electronically Signed   By: Conchita Paris M.D.   On: 08/01/2014 18:22     Assessment/Plan:  Frank [name] s/p plasma exchange and recently discharged from rehab. Presents to ED today with increased weakness and difficulty breathing. CT chest and history not fully consistent with diagnosis of PNA. Suspect his symptoms are related to recurrent [name].  -will plan for IVIG 0.4grams/kg/day x 5 doses -check NIF and VC q4hrs. Appreciate PCCM following.   This patient is critically ill  and at significant risk of neurological worsening, death and care requires constant monitoring of vital signs, hemodynamics,respiratory and cardiac monitoring,review of multiple databases, neurological assessment, discussion with family, other specialists and medical decision making of high complexity. I spent 45 inutes of neurocritical care time in the care of this patient.    Jim Like, DO [facility] [phone]  If 7pm- 7am, please page neurology on call as listed in [place]. 08/02/2014, 1:36 AM

## 2014-08-02 NOTE — Progress Notes (Signed)
UR completed.  Giannis Corpuz, RN BSN [facility] CCM Trauma/Neuro ICU Case Manager [phone]  

## 2014-08-03 ENCOUNTER — Inpatient Hospital Stay (HOSPITAL_COMMUNITY): Payer: BC Managed Care – PPO

## 2014-08-03 DIAGNOSIS — J9601 Acute respiratory failure with hypoxia: Secondary | ICD-10-CM

## 2014-08-03 LAB — CBC
HCT: 34.2 % — ABNORMAL LOW (ref 39.0–52.0)
Hemoglobin: 11.8 g/dL — ABNORMAL LOW (ref 13.0–17.0)
MCH: 29.9 pg (ref 26.0–34.0)
MCHC: 34.5 g/dL (ref 30.0–36.0)
MCV: 86.6 fL (ref 78.0–100.0)
PLATELETS: 224 10*3/uL (ref 150–400)
RBC: 3.95 MIL/uL — ABNORMAL LOW (ref 4.22–5.81)
RDW: 12.4 % (ref 11.5–15.5)
WBC: 4 10*3/uL (ref 4.0–10.5)

## 2014-08-03 LAB — BASIC METABOLIC PANEL
ANION GAP: 8 (ref 5–15)
Anion gap: 10 (ref 5–15)
BUN: 11 mg/dL (ref 6–23)
BUN: 11 mg/dL (ref 6–23)
CALCIUM: 8.9 mg/dL (ref 8.4–10.5)
CHLORIDE: 104 meq/L (ref 96–112)
CO2: 25 mmol/L (ref 19–32)
CO2: 22 mmol/L (ref 19–32)
Calcium: 8.9 mg/dL (ref 8.4–10.5)
Chloride: 106 mEq/L (ref 96–112)
Creatinine, Ser: 0.68 mg/dL (ref 0.50–1.35)
Creatinine, Ser: 0.72 mg/dL (ref 0.50–1.35)
GFR calc Af Amer: >90 mL/min (ref >90)
GFR calc Af Amer: >90 mL/min (ref >90)
GFR calc non Af Amer: >90 mL/min (ref >90)
GFR calc non Af Amer: >90 mL/min (ref >90)
GLUCOSE: 78 mg/dL (ref 70–99)
Glucose, Bld: 87 mg/dL (ref 70–99)
POTASSIUM: 3.4 mmol/L — AB (ref 3.5–5.1)
Potassium: 3.1 mmol/L — ABNORMAL LOW (ref 3.5–5.1)
SODIUM: 139 mmol/L (ref 135–145)
Sodium: 136 mmol/L (ref 135–145)

## 2014-08-03 LAB — MAGNESIUM: MAGNESIUM: 1.8 mg/dL (ref 1.5–2.5)

## 2014-08-03 LAB — PHOSPHORUS: Phosphorus: 4.3 mg/dL (ref 2.3–4.6)

## 2014-08-03 MED ORDER — METHOCARBAMOL 500 MG PO TABS
500.0000 mg | ORAL_TABLET | Freq: Four times a day (QID) | ORAL | Status: DC | PRN
  Administered 2014-08-03 – 2014-08-08 (×3): 500 mg via ORAL
  Filled 2014-08-03 (×4): qty 1

## 2014-08-03 MED ORDER — ONDANSETRON HCL 4 MG/2ML IJ SOLN
4.0000 mg | Freq: Four times a day (QID) | INTRAMUSCULAR | Status: DC | PRN
  Administered 2014-08-03 – 2014-08-04 (×2): 4 mg via INTRAVENOUS
  Filled 2014-08-03 (×2): qty 2

## 2014-08-03 MED ORDER — BISACODYL 5 MG PO TBEC
10.0000 mg | DELAYED_RELEASE_TABLET | Freq: Every day | ORAL | Status: DC | PRN
  Administered 2014-08-03 – 2014-08-05 (×2): 10 mg via ORAL
  Filled 2014-08-03 (×5): qty 2

## 2014-08-03 MED ORDER — DIPHENHYDRAMINE HCL 50 MG/ML IJ SOLN
50.0000 mg | Freq: Four times a day (QID) | INTRAMUSCULAR | Status: DC | PRN
  Administered 2014-08-03: 50 mg via INTRAVENOUS
  Filled 2014-08-03: qty 1

## 2014-08-03 MED ORDER — POTASSIUM CHLORIDE 10 MEQ/100ML IV SOLN
10.0000 meq | INTRAVENOUS | Status: AC
  Administered 2014-08-03 (×4): 10 meq via INTRAVENOUS
  Filled 2014-08-03 (×4): qty 100

## 2014-08-03 MED ORDER — DEXMEDETOMIDINE HCL IN NACL 200 MCG/50ML IV SOLN
0.2000 ug/kg/h | INTRAVENOUS | Status: DC
  Administered 2014-08-03: 0.4 ug/kg/h via INTRAVENOUS
  Administered 2014-08-03: 0.2 ug/kg/h via INTRAVENOUS
  Filled 2014-08-03 (×2): qty 50

## 2014-08-03 MED ORDER — HALOPERIDOL LACTATE 5 MG/ML IJ SOLN
4.0000 mg | Freq: Once | INTRAMUSCULAR | Status: AC
  Administered 2014-08-03: 4 mg via INTRAVENOUS
  Filled 2014-08-03: qty 1

## 2014-08-03 NOTE — Progress Notes (Signed)
Pt becoming increasingly agitated and anxious throughout the day, continually requesting to ambulate to bathroom to move bowels.  Attempted to explain to pt that this is a safety concern given his immobility and muscle weakness.  Pt unable to "teach back" provided education.  Second RN in to explain limitations and safety concerns with pt without success.  Will continue to closely monitor.  Nakiesha Rumsey, [facility]

## 2014-08-03 NOTE — H&P (Signed)
PULMONARY / CRITICAL CARE MEDICINE   Name: Frank [name] MRN: [id number] DOB: 08-08-1956    ADMISSION DATE:  08/01/2014 CONSULTATION DATE:  08/03/2014  REFERRING MD :  EDP  CHIEF COMPLAINT:  SOB  INITIAL PRESENTATION:  58 y.o. M with hx of GBS, recently discharged after prolonged hospitalization for GBS where he required intubation.  Brought to [facility] ED 12/22 for SOB and recurrent muscle weakness.  He was started on BiPAP and transferred to [facility] neuro ICU at request of neurology.  PCCM called for admission.   STUDIES:  CTA chest 12/22 >>> bibasilar atx, neg for PE  SIGNIFICANT EVENTS: 12/22 - admitted to [facility] neuro ICU for probable GBS flare   HISTORY OF PRESENT ILLNESS:  Pt is on BiPAP and is unable to speak comfortably; therefore, this HPI is obtained from chart review. Frank [name] is a 58 y.o. M with PMH as outlined below which includes GBS.  He was recently admitted to [facility] 11/19 and required intubation 11/20 as well as PLEX.  He was extubated 11/25.  He was then discharged to rehab on 12/1 before being discharged home 12/19. Apparently, he was progressing in rehab and got back to walking fairly well.  3 - 4 days prior to discharge home, he re-developed symptoms of GBS flare. While he was home, his symptoms worsened prompting him to go to ED for further evaluation on 12/22.  His main complaint at the time was SOB.  He denied any cough, fevers/chills/sweats.  He has also continued to have motor weakness and states that this is similar to the last GBS flare he had. Neurology was consulted and they requested that pt be transferred to [facility] neuro ICU.  He was placed on BiPAP prior to transfer and PCCM was called for admission.      SUBJECTIVE:  SOB much improved on BiPAP. Changed bipap to prn VITAL SIGNS: Temp:  [97.6 F (36.4 C)-98.6 F (37 C)] 98.6 F (37 C) (12/24 0800) Pulse Rate:  [65-97] 87 (12/24 1100) Resp:  [9-24] 24 (12/24 1100) BP: (143-179)/(63-105) 177/92 mmHg (12/24  1100) SpO2:  [96 %-100 %] 100 % (12/24 1100) FiO2 (%):  [30 %] 30 % (12/24 0746) Weight:  [177 lb 4 oz (80.4 kg)] 177 lb 4 oz (80.4 kg) (12/24 0424) HEMODYNAMICS:   VENTILATOR SETTINGS: Vent Mode:  [-] BIPAP FiO2 (%):  [30 %] 30 % Set Rate:  [12 bmp-15 bmp] 15 bmp PEEP:  [5 cmH20] 5 cmH20 INTAKE / OUTPUT: Intake/Output      12/23 0701 - 12/24 0700 12/24 0701 - 12/25 0700   I.V. (mL/kg) 2100 (26.1) 1114.1 (13.9)   IV Piggyback  500   Total Intake(mL/kg) 2100 (26.1) 1614.1 (20.1)   Urine (mL/kg/hr) 2550 (1.3) 1000 (2.6)   Total Output 2550 1000   Net -450 +614.1          PHYSICAL EXAMINATION: General: Elderly male, in NAD., off bipap Neuro: A&O x 3, non-focal. Bilateral LE weakness. HEENT: [place]/AT. PERRL, sclerae anicteric.   Cardiovascular: RRR, no M/R/G.  Lungs: Respirations shallow, unlabored.  Diminished BS, no W/R/R. Abdomen: BS x 4, soft, NT/ND.  Musculoskeletal: No gross deformities, no edema.  Skin: Intact, warm, no rashes.  LABS:  CBC  Recent Labs Lab 08/01/14 1654 08/02/14 0254 08/03/14 0228  WBC 6.3 5.2 4.0  HGB 13.8 12.7* 11.8*  HCT 40.7 38.4* 34.2*  PLT 272 248 224   Coag's No results for input(s): APTT, INR in the last 168 hours. BMET  Recent  Labs Lab 08/02/14 0254 08/03/14 0228 08/03/14 0646  NA 135 139 136  K 3.7 3.1* 3.4*  CL 100 106 104  CO2 24 25 22   BUN 19 11 11   CREATININE 0.94 0.68 0.72  GLUCOSE 98 87 78   Electrolytes  Recent Labs Lab 08/02/14 0254 08/03/14 0228 08/03/14 0646  CALCIUM 9.2 8.9 8.9  MG 2.0 1.8  --   PHOS 4.8* 4.3  --    Sepsis Markers No results for input(s): LATICACIDVEN, PROCALCITON, O2SATVEN in the last 168 hours. ABG  Recent Labs Lab 08/02/14 0419  PHART 7.388  PCO2ART 42.0  PO2ART 89.4   Liver Enzymes No results for input(s): AST, ALT, ALKPHOS, BILITOT, ALBUMIN in the last 168 hours. Cardiac Enzymes No results for input(s): TROPONINI, PROBNP in the last 168 hours. Glucose  Recent  Labs Lab 07/28/14 1154 07/28/14 1707 07/28/14 2043 07/29/14 0717 07/29/14 1134 08/01/14 2035  GLUCAP 105* 124* 104* 96 103* 99    Imaging No results found.  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 IVIG per neuro. Hold outpatient cymbalta, ativan, robaxin, methylphenidate, oxycodone, lyrica. Stay in ICU for close monitoring  PULMONARY A: Dyspnea - secondary to GBS OSA P:   PRN  BiPAP for now. If unable to tolerate or declines at all, will need intubation. Q4hr NIF's & vital capacity's.  CARDIOVASCULAR A:  Hx HTN P:  Monitor hemodynamics.  RENAL A:   No acute issues P:   NS @ 100. Replace electrolytes as indicated. BMP in AM.  GASTROINTESTINAL A:   GERD Nutrition P:   Pepcid. NPO for now.  HEMATOLOGIC A:   No acute issues VTE Prophylaxis P:  SCD's / Heparin. CBC in AM.  INFECTIOUS A:   No indication of infection P:   Monitor clinically.  ENDOCRINE A:   No known issues P:   SSI if glucose consistently > 180.  Family updated: None  Interdisciplinary Family Meeting v Palliative Care Meeting:  Due by: 12/29.  [name] Minor ACNP Maryanna Shape PCCM Pager [phone] till 3 pm If no answer page [phone] 08/03/2014, 11:45 AM  Remain on BiPAP, NIF and VC are acceptable and do not warrant intubation for now.  Continue treatment of GBS as indicated.  Nausea treated with zofran.  Maintain NPO due to high risk for intubation.  Maintain in the ICU for now.  The patient is critically ill with multiple organ systems failure and requires high complexity decision making for assessment and support, frequent evaluation and titration of therapies, application of advanced monitoring technologies and extensive interpretation of multiple databases.   Critical Care Time devoted to patient care services described in this note is  35  Minutes. This time reflects time of care of this signee Dr Jennet Maduro. This  critical care time does not reflect procedure time, or teaching time or supervisory time of PA/NP/Med student/Med Resident etc but could involve care discussion time.  Rush Farmer, M.D. [facility] Pulmonary/Critical Care Medicine. Pager: [phone]. After hours pager: [phone].

## 2014-08-03 NOTE — Progress Notes (Signed)
[facility] Progress Note Patient Name: Nahun Kronberg DOB: [birth year] MRN: [id number]   Date of Service  08/03/2014  HPI/Events of Note  Pt with progressive agitation, and combativeness.  Not improved with haldol/ativan.   eICU Interventions  Will start precedex gtt.     Intervention Category Major Interventions: Other:  Crescencio Jozwiak 08/03/2014, 6:03 PM

## 2014-08-03 NOTE — Progress Notes (Signed)
Pt asleep at this time, RT will skip 0400 NIF and VC.  RT to monitor and assess as needed.

## 2014-08-03 NOTE — Progress Notes (Signed)
Subjective: Stable nifs  Exam: Filed Vitals:   08/03/14 1145  BP:   Pulse: 94  Temp:   Resp: 24   Gen: In bed, NAD MS: awake, alert TC:YELYH Motor: bilateral LE weakness, 3 - 4/5  Sensory:reduced to LT below the knees.    Impression: 58 yo M with GBS, recurrent worsening after initial treatment. He is within the window where I would still consider this GBS and he is being treated with IVIG.   Recommendations: 1)continue IVIG for total 5 days.   Roland Rack, MD [facility] [phone]  If 7pm- 7am, please page neurology on call as listed in [place].

## 2014-08-03 NOTE — Progress Notes (Signed)
NIF -28 and VC 1.35L, Pt demonstrated with good effort and technique.  RT to monitor and assess as needed.

## 2014-08-03 NOTE — Progress Notes (Signed)
Pt pushing call light ~ every five minutes. Does not want nurse to leave room. Family contacted and pt has told them to leave and not come back. Continues to use call back frequently. Pt attempting to get OOB to go to BR. Educated on safety issues of getting up at this time. Refuses bedpan multiple times. Cont to complain of need to have BM. Order to use sling to get into chair but not to BR. Pt educated again. Pt very restless, agitated and upset. One time dose of Haldol given. Pt sightly calmer but remains restless and requesting to go to BR.

## 2014-08-03 NOTE — Progress Notes (Signed)
[facility] ADULT ICU REPLACEMENT PROTOCOL FOR AM LAB REPLACEMENT ONLY  The patient does apply for the [facility] Adult ICU Electrolyte Replacment Protocol based on the criteria listed below:   1. Is GFR >/= 40 ml/min? Yes.    Patient's GFR today is >90 2. Is urine output >/= 0.5 ml/kg/hr for the last 6 hours? Yes.   Patient's UOP is 1.3 ml/kg/hr 3. Is BUN < 60 mg/dL? Yes.    Patient's BUN today is 11 4. Abnormal electrolyte(s): Potassium 5. Ordered repletion with: Potassium  Per protocol   Beverly Ferner P 08/03/2014 3:52 AM

## 2014-08-03 NOTE — Progress Notes (Signed)
NIF-40 and VC 1.5L, Pt demonstrated with good technique and effort.  RT to monitor and assess as needed.

## 2014-08-03 NOTE — Progress Notes (Signed)
Pt NIF and VC are WNL at this time, pt currently off BIPAP and tolerating well.  Pt in no noted distress and has no increase WOB.  RT will hold BIPAP at this time, RT to continue to monitor and assess as needed.

## 2014-08-03 NOTE — Progress Notes (Signed)
NIF -20  VC 1.80L

## 2014-08-04 ENCOUNTER — Inpatient Hospital Stay (HOSPITAL_COMMUNITY): Payer: BC Managed Care – PPO

## 2014-08-04 LAB — BASIC METABOLIC PANEL
Anion gap: 9 (ref 5–15)
BUN: 13 mg/dL (ref 6–23)
CALCIUM: 9.2 mg/dL (ref 8.4–10.5)
CHLORIDE: 107 meq/L (ref 96–112)
CO2: 23 mmol/L (ref 19–32)
CREATININE: 1.16 mg/dL (ref 0.50–1.35)
GFR calc non Af Amer: 67 mL/min — ABNORMAL LOW (ref >90)
GFR, EST AFRICAN AMERICAN: 77 mL/min — AB (ref >90)
Glucose, Bld: 103 mg/dL — ABNORMAL HIGH (ref 70–99)
Potassium: 4 mmol/L (ref 3.5–5.1)
Sodium: 139 mmol/L (ref 135–145)

## 2014-08-04 LAB — CBC
HEMATOCRIT: 35.6 % — AB (ref 39.0–52.0)
Hemoglobin: 12.3 g/dL — ABNORMAL LOW (ref 13.0–17.0)
MCH: 29.9 pg (ref 26.0–34.0)
MCHC: 34.6 g/dL (ref 30.0–36.0)
MCV: 86.4 fL (ref 78.0–100.0)
PLATELETS: 236 10*3/uL (ref 150–400)
RBC: 4.12 MIL/uL — ABNORMAL LOW (ref 4.22–5.81)
RDW: 12.6 % (ref 11.5–15.5)
WBC: 6.9 10*3/uL (ref 4.0–10.5)

## 2014-08-04 LAB — PHOSPHORUS: Phosphorus: 4.9 mg/dL — ABNORMAL HIGH (ref 2.3–4.6)

## 2014-08-04 LAB — MAGNESIUM: Magnesium: 1.8 mg/dL (ref 1.5–2.5)

## 2014-08-04 MED ORDER — OXYCODONE HCL 5 MG PO TABS
15.0000 mg | ORAL_TABLET | Freq: Four times a day (QID) | ORAL | Status: DC | PRN
  Administered 2014-08-04 – 2014-08-08 (×9): 15 mg via ORAL
  Filled 2014-08-04 (×10): qty 3

## 2014-08-04 MED ORDER — DULOXETINE HCL 30 MG PO CPEP
30.0000 mg | ORAL_CAPSULE | Freq: Every day | ORAL | Status: DC
  Administered 2014-08-04 – 2014-08-09 (×6): 30 mg via ORAL
  Filled 2014-08-04 (×6): qty 1

## 2014-08-04 MED ORDER — ASPIRIN EC 81 MG PO TBEC
81.0000 mg | DELAYED_RELEASE_TABLET | Freq: Every day | ORAL | Status: DC
  Administered 2014-08-04 – 2014-08-09 (×6): 81 mg via ORAL
  Filled 2014-08-04 (×6): qty 1

## 2014-08-04 MED ORDER — METHYLPHENIDATE HCL ER 18 MG PO TB24
36.0000 mg | ORAL_TABLET | Freq: Every day | ORAL | Status: DC
  Administered 2014-08-04 – 2014-08-09 (×6): 36 mg via ORAL
  Filled 2014-08-04 (×6): qty 2

## 2014-08-04 MED ORDER — LORAZEPAM 0.5 MG PO TABS
0.5000 mg | ORAL_TABLET | Freq: Two times a day (BID) | ORAL | Status: DC
  Administered 2014-08-04 – 2014-08-09 (×11): 0.5 mg via ORAL
  Filled 2014-08-04 (×11): qty 1

## 2014-08-04 MED ORDER — SODIUM CHLORIDE 0.9 % IV BOLUS (SEPSIS)
500.0000 mL | Freq: Once | INTRAVENOUS | Status: AC
  Administered 2014-08-04: 500 mL via INTRAVENOUS

## 2014-08-04 MED ORDER — PREGABALIN 75 MG PO CAPS
200.0000 mg | ORAL_CAPSULE | Freq: Three times a day (TID) | ORAL | Status: DC
  Administered 2014-08-04 – 2014-08-09 (×17): 200 mg via ORAL
  Filled 2014-08-04: qty 2
  Filled 2014-08-04: qty 1
  Filled 2014-08-04 (×2): qty 4
  Filled 2014-08-04: qty 2
  Filled 2014-08-04: qty 4
  Filled 2014-08-04: qty 1
  Filled 2014-08-04 (×3): qty 2
  Filled 2014-08-04: qty 4
  Filled 2014-08-04: qty 1
  Filled 2014-08-04 (×3): qty 2
  Filled 2014-08-04: qty 1
  Filled 2014-08-04: qty 2
  Filled 2014-08-04: qty 1
  Filled 2014-08-04: qty 4
  Filled 2014-08-04: qty 1
  Filled 2014-08-04: qty 4
  Filled 2014-08-04 (×2): qty 1
  Filled 2014-08-04: qty 4
  Filled 2014-08-04: qty 2
  Filled 2014-08-04 (×2): qty 1

## 2014-08-04 NOTE — Progress Notes (Signed)
Pt refusing to do NIF and VC at this time, Pt is agitated and trying to get out of bed.  RT will attempt later on.  RT to monitor and assess as needed.

## 2014-08-04 NOTE — Progress Notes (Signed)
NIF -30, FVC 1.4L with good effort. RT will continue to monitor.

## 2014-08-04 NOTE — Progress Notes (Signed)
PT resting at this time and did not want to be woken up. Will hold off on NIF/FVC. RT will continue to monitor Respiratory status.

## 2014-08-04 NOTE — Progress Notes (Signed)
Subjective: Improving NIFs.   Exam: Filed Vitals:   08/04/14 1015  BP: 149/81  Pulse: 122  Temp:   Resp: 16   Gen: In bed, NAD MS: drowsy due to recent pain medicines.  NT:ZGYFV Motor: bilateral LE weakness, 3 - 4/5. He is able to lift his right leg better than left.  Sensory:reduced to LT below the knees.    Impression: 58 yo M with GBS, recurrent worsening after initial treatment. He is within the window where I would still consider this GBS and he is being treated with IVIG.   Recommendations: 1)continue IVIG for total 5 days.   Roland Rack, MD [facility] [phone]  If 7pm- 7am, please page neurology on call as listed in [place].

## 2014-08-04 NOTE — Progress Notes (Signed)
Vc 1.9 and IF -26cm.  Good effort. I woke pt up to do measurements so a little sleepy

## 2014-08-04 NOTE — Progress Notes (Signed)
PULMONARY / CRITICAL CARE MEDICINE   Name: Frank [name] MRN: [id number] DOB: Feb 22, 1956    ADMISSION DATE:  08/01/2014 CONSULTATION DATE:  08/04/2014  REFERRING MD :  EDP  CHIEF COMPLAINT:  SOB  INITIAL PRESENTATION:  Frank [name] is a 58 y.o. M with PMH as outlined below which includes GBS.  He was recently admitted to [facility] 11/19 and required intubation 11/20 as well as PLEX.  He was extubated 11/25.  He was then discharged to rehab on 12/1 before being discharged home 12/19. Apparently, he was progressing in rehab and got back to walking fairly well.  3 - 4 days prior to discharge home, he re-developed symptoms of GBS flare. While he was home, his symptoms worsened prompting him to go to ED for further evaluation on 12/22.  His main complaint at the time was SOB.  He denied any cough, fevers/chills/sweats.  He has also continued to have motor weakness and states that this is similar to the last GBS flare he had. Neurology was consulted and they requested that pt be transferred to [facility] neuro ICU.  He was placed on BiPAP prior to transfer and PCCM was called for admission.   STUDIES:  CTA chest 12/22 >>> bibasilar atx, neg for PE  SIGNIFICANT EVENTS: 12/22 - admitted to [facility] neuro ICU for probable GBS flare   SUBJECTIVE:  SOB much improved, off BiPAP. Precedex stopped 12/24 for hypotension  VITAL SIGNS: Temp:  [97.4 F (36.3 C)-99.2 F (37.3 C)] 98.3 F (36.8 C) (12/25 0600) Pulse Rate:  [58-146] 96 (12/25 0700) Resp:  [11-28] 24 (12/25 0700) BP: (56-181)/(36-130) 128/84 mmHg (12/25 0700) SpO2:  [93 %-100 %] 96 % (12/25 0700) Weight:  [77.5 kg (170 lb 13.7 oz)] 77.5 kg (170 lb 13.7 oz) (12/25 0400) HEMODYNAMICS:   VENTILATOR SETTINGS:   INTAKE / OUTPUT: Intake/Output      12/24 0701 - 12/25 0700 12/25 0701 - 12/26 0700   I.V. (mL/kg) 9213.9 (118.9)    IV Piggyback 1050    Total Intake(mL/kg) 10263.9 (132.4)    Urine (mL/kg/hr) 4470 (2.4)    Total Output 4470     Net  +5793.9            PHYSICAL EXAMINATION: General: Elderly male, in NAD., off bipap Neuro: A&O x 3, non-focal. Bilateral LE weakness.hoarse voice, NIF -40 HEENT: [place]/AT. PERRL, sclerae anicteric.   Cardiovascular: RRR, no M/R/G.  Lungs: Respirations shallow, unlabored.  Diminished BS, no W/R/R. Abdomen: BS x 4, soft, NT/ND.  Musculoskeletal: No gross deformities, no edema.  Skin: Intact, warm, no rashes.  LABS:  CBC  Recent Labs Lab 08/02/14 0254 08/03/14 0228 08/04/14 0321  WBC 5.2 4.0 6.9  HGB 12.7* 11.8* 12.3*  HCT 38.4* 34.2* 35.6*  PLT 248 224 236   Coag's No results for input(s): APTT, INR in the last 168 hours. BMET  Recent Labs Lab 08/03/14 0228 08/03/14 0646 08/04/14 0321  NA 139 136 139  K 3.1* 3.4* 4.0  CL 106 104 107  CO2 25 22 23   BUN 11 11 13   CREATININE 0.68 0.72 1.16  GLUCOSE 87 78 103*   Electrolytes  Recent Labs Lab 08/02/14 0254 08/03/14 0228 08/03/14 0646 08/04/14 0321  CALCIUM 9.2 8.9 8.9 9.2  MG 2.0 1.8  --  1.8  PHOS 4.8* 4.3  --  4.9*   Sepsis Markers No results for input(s): LATICACIDVEN, PROCALCITON, O2SATVEN in the last 168 hours. ABG  Recent Labs Lab 08/02/14 0419  PHART 7.388  PCO2ART  42.0  PO2ART 89.4   Liver Enzymes No results for input(s): AST, ALT, ALKPHOS, BILITOT, ALBUMIN in the last 168 hours. Cardiac Enzymes No results for input(s): TROPONINI, PROBNP in the last 168 hours. Glucose  Recent Labs Lab 07/28/14 1154 07/28/14 1707 07/28/14 2043 07/29/14 0717 07/29/14 1134 08/01/14 2035  GLUCAP 105* 124* 104* 96 103* 99    Imaging Dg Chest Port 1 View  08/03/2014   CLINICAL DATA:  Respiratory failure.  EXAM: PORTABLE CHEST - 1 VIEW  COMPARISON:  CT 08/01/2014.  Chest x-ray 07/28/2014.  FINDINGS: Mediastinum and hilar structures are normal. Left lung base subsegmental atelectasis and or infiltrate again noted.No pleural effusion pneumothorax. Heart size normal. No acute bony abnormality.  IMPRESSION:  Persistent left base subsegmental atelectasis and or infiltrate.   Electronically Signed   By: Marcello Moores  Register   On: 08/03/2014 07:29    ASSESSMENT / PLAN:  NEUROLOGIC A:   Probable GBS flare / recurrent GBS P:   Neuro following. IVIG per neuro 3/5 Resume outpatient cymbalta, ativan, robaxin, methylphenidate, oxycodone, lyrica. Stay in ICU for close monitoring  PULMONARY A: Dyspnea - secondary to GBS OSA P:   PRN  BiPAP for now -NIF improving Q4hr NIF's & vital capacity's.  CARDIOVASCULAR A:  Hx HTN P:  Monitor hemodynamics.  RENAL A:   No acute issues P:   Dc NS  Replace electrolytes as indicated. BMP in AM.  GASTROINTESTINAL A:   GERD Nutrition P:   Pepcid. Advance PO per swallow  HEMATOLOGIC A:   No acute issues VTE Prophylaxis P:  SCD's / Heparin.   INFECTIOUS A:   No indication of infection P:   Monitor clinically.  ENDOCRINE A:   No known issues P:   SSI if glucose consistently > 180.  Family updated: None  Interdisciplinary Family Meeting v Palliative Care Meeting:  NA   Summary - Improving NIF, needs 5 ds of IV IgG, keep in ICU Underlying severe anxiety  The patient is critically ill with multiple organ systems failure and requires high complexity decision making for assessment and support, frequent evaluation and titration of therapies, application of advanced monitoring technologies and extensive interpretation of multiple databases. Critical Care Time devoted to patient care services described in this note independent of APP time is 31 minutes.    Kara Mead MD. Shade Flood. [place] Pulmonary & Critical care Pager [phone] If no response call [phone]   08/04/2014, 7:52 AM

## 2014-08-04 NOTE — Progress Notes (Signed)
[facility] Progress Note Patient Name: Frank [name] DOB: May 11, 1956 MRN: [id number]   Date of Service  08/04/2014  HPI/Events of Note    eICU Interventions  NS 500 cc DC dexmedetomidine     Intervention Category Intermediate Interventions: Hypotension - evaluation and management  Merton Border 08/04/2014, 2:55 AM

## 2014-08-04 NOTE — Progress Notes (Signed)
Pt finally asleep at this time, RT will hold NIF and VC at this time and let Pt rest.  RT will attempt at a later time.  RT to monitor and assess as needed.

## 2014-08-05 LAB — BASIC METABOLIC PANEL
ANION GAP: 3 — AB (ref 5–15)
BUN: 13 mg/dL (ref 6–23)
CHLORIDE: 105 meq/L (ref 96–112)
CO2: 27 mmol/L (ref 19–32)
CREATININE: 0.71 mg/dL (ref 0.50–1.35)
Calcium: 8.9 mg/dL (ref 8.4–10.5)
Glucose, Bld: 106 mg/dL — ABNORMAL HIGH (ref 70–99)
Potassium: 3.3 mmol/L — ABNORMAL LOW (ref 3.5–5.1)
Sodium: 135 mmol/L (ref 135–145)

## 2014-08-05 LAB — CBC
HCT: 32.7 % — ABNORMAL LOW (ref 39.0–52.0)
HEMOGLOBIN: 11.3 g/dL — AB (ref 13.0–17.0)
MCH: 29.9 pg (ref 26.0–34.0)
MCHC: 34.6 g/dL (ref 30.0–36.0)
MCV: 86.5 fL (ref 78.0–100.0)
Platelets: 226 10*3/uL (ref 150–400)
RBC: 3.78 MIL/uL — ABNORMAL LOW (ref 4.22–5.81)
RDW: 12.7 % (ref 11.5–15.5)
WBC: 5.3 10*3/uL (ref 4.0–10.5)

## 2014-08-05 MED ORDER — FAMOTIDINE IN NACL 20-0.9 MG/50ML-% IV SOLN
20.0000 mg | Freq: Two times a day (BID) | INTRAVENOUS | Status: AC
  Administered 2014-08-05: 20 mg via INTRAVENOUS
  Filled 2014-08-05: qty 50

## 2014-08-05 MED ORDER — POTASSIUM CHLORIDE 10 MEQ/100ML IV SOLN
10.0000 meq | INTRAVENOUS | Status: AC
  Administered 2014-08-05 (×3): 10 meq via INTRAVENOUS
  Filled 2014-08-05 (×2): qty 100

## 2014-08-05 NOTE — Progress Notes (Signed)
Patient refusing NIF/VC today.  Will continue to monitor.

## 2014-08-05 NOTE — Progress Notes (Signed)
NIF -30. FVC 1.3L. Pt has good effort but was sleepy while performing NIF and FVC.

## 2014-08-05 NOTE — Progress Notes (Signed)
NIF -30, FVC 1.3L with good effort.

## 2014-08-05 NOTE — Progress Notes (Signed)
Subjective: NIFs stable  Exam: Filed Vitals:   08/05/14 0800  BP: 142/81  Pulse: 70  Temp:   Resp: 13   Gen: In bed, NAD MS: drowsy due to recent ativan. NK:NLZJQ Motor: bilateral LE weakness, 3 - 4/5. He is able to lift his right leg better than left.  Sensory:reduced to LT below the knees.    Impression: 58 yo M with GBS, recurrent worsening after initial treatment. He is within the window where I would still consider this GBS and he is being treated with IVIG.   Recommendations: 1)continue IVIG for total 5 days.   Roland Rack, MD [facility] [phone]  If 7pm- 7am, please page neurology on call as listed in [place].

## 2014-08-05 NOTE — Progress Notes (Signed)
PULMONARY / CRITICAL CARE MEDICINE   Name: Frank [name] MRN: [id number] DOB: [birth year]    ADMISSION DATE:  08/01/2014 CONSULTATION DATE:  08/05/2014  REFERRING MD :  EDP  CHIEF COMPLAINT:  SOB  INITIAL PRESENTATION:  Frank [name] is a 58 y.o. M with PMH as outlined below which includes GBS.  He was recently admitted to [facility] 11/19 and required intubation 11/20 as well as PLEX.  He was extubated 11/25.  He was then discharged to rehab on 12/1 before being discharged home 12/19. Apparently, he was progressing in rehab and got back to walking fairly well.  3 - 4 days prior to discharge home, he re-developed symptoms of GBS flare. While he was home, his symptoms worsened prompting him to go to ED for further evaluation on 12/22.  His main complaint at the time was SOB.  He denied any cough, fevers/chills/sweats.  He has also continued to have motor weakness and states that this is similar to the last GBS flare he had. Neurology was consulted and they requested that pt be transferred to [facility] neuro ICU.  He was placed on BiPAP prior to transfer and PCCM was called for admission.   STUDIES:  CTA chest 12/22 >>> bibasilar atx, neg for PE  SIGNIFICANT EVENTS: 12/22 - admitted to [facility] neuro ICU for probable GBS flare 12/25 precedex stopped due to hypotension   SUBJECTIVE:   off BiPAP. Precedex off Sleepy this am but wakes up easily   VITAL SIGNS: Temp:  [97.6 F (36.4 C)-98.6 F (37 C)] 98.6 F (37 C) (12/26 0700) Pulse Rate:  [60-123] 77 (12/26 0700) Resp:  [11-24] 13 (12/26 0700) BP: (100-165)/(52-95) 135/66 mmHg (12/26 0700) SpO2:  [91 %-100 %] 99 % (12/26 0700) Weight:  [77.6 kg (171 lb 1.2 oz)] 77.6 kg (171 lb 1.2 oz) (12/26 0343) HEMODYNAMICS:   VENTILATOR SETTINGS:   INTAKE / OUTPUT: Intake/Output      12/25 0701 - 12/26 0700 12/26 0701 - 12/27 0700   I.V. (mL/kg) 832.6 (10.7)    Other 110    IV Piggyback 100    Total Intake(mL/kg) 1042.6 (13.4)    Urine (mL/kg/hr) 935  (0.5)    Total Output 935     Net +107.6            PHYSICAL EXAMINATION: General: Elderly male, in NAD., able to lie supine Neuro: A&O x 3, non-focal. Bilateral LE weakness.hoarse voice, NIF -30 HEENT: [place]/AT. PERRL, sclerae anicteric.   Cardiovascular: RRR, no M/R/G.  Lungs: Respirations shallow, unlabored.  Diminished BS, no W/R/R. Abdomen: BS x 4, soft, NT/ND.  Musculoskeletal: No gross deformities, no edema.  Skin: Intact, warm, no rashes.  LABS:  CBC  Recent Labs Lab 08/03/14 0228 08/04/14 0321 08/05/14 0245  WBC 4.0 6.9 5.3  HGB 11.8* 12.3* 11.3*  HCT 34.2* 35.6* 32.7*  PLT 224 236 226   Coag's No results for input(s): APTT, INR in the last 168 hours. BMET  Recent Labs Lab 08/03/14 0646 08/04/14 0321 08/05/14 0245  NA 136 139 135  K 3.4* 4.0 3.3*  CL 104 107 105  CO2 22 23 27   BUN 11 13 13   CREATININE 0.72 1.16 0.71  GLUCOSE 78 103* 106*   Electrolytes  Recent Labs Lab 08/02/14 0254 08/03/14 0228 08/03/14 0646 08/04/14 0321 08/05/14 0245  CALCIUM 9.2 8.9 8.9 9.2 8.9  MG 2.0 1.8  --  1.8  --   PHOS 4.8* 4.3  --  4.9*  --    Sepsis  Markers No results for input(s): LATICACIDVEN, PROCALCITON, O2SATVEN in the last 168 hours. ABG  Recent Labs Lab 08/02/14 0419  PHART 7.388  PCO2ART 42.0  PO2ART 89.4   Liver Enzymes No results for input(s): AST, ALT, ALKPHOS, BILITOT, ALBUMIN in the last 168 hours. Cardiac Enzymes No results for input(s): TROPONINI, PROBNP in the last 168 hours. Glucose  Recent Labs Lab 07/29/14 1134 08/01/14 2035  GLUCAP 103* 99    Imaging Dg Chest Port 1 View  08/04/2014   CLINICAL DATA:  Shortness of breath and cough  EXAM: PORTABLE CHEST - 1 VIEW  COMPARISON:  08/03/2014  FINDINGS: Cardiac shadow is within normal limits. Stable atelectatic changes are noted at the left base. The overall inspiratory effort is poor. No new focal abnormality is seen.  IMPRESSION: Stable changes in the left base.  No new focal  abnormality is noted.   Electronically Signed   By: Inez Catalina M.D.   On: 08/04/2014 07:52    ASSESSMENT / PLAN:  NEUROLOGIC A:   Probable GBS flare / recurrent GBS P:   Neuro following. IVIG per neuro 4/5 Resume outpatient cymbalta, ativan, robaxin, methylphenidate, oxycodone, lyrica. Stay in ICU for close monitoring Mobilise  PULMONARY A: Dyspnea - secondary to GBS OSA P:   PRN  BiPAP for now -NIF improving  -30 to -40 range Q6hr NIF's & vital capacity's.  CARDIOVASCULAR A:  Hx HTN P:  Monitor hemodynamics.  RENAL A:   No acute issues P:   Dc NS  Replace electrolytes as indicated. BMP in AM.  GASTROINTESTINAL A:   GERD Nutrition P:   Pepcid. Advance PO per swallow  HEMATOLOGIC A:   No acute issues VTE Prophylaxis P:  SCD's / Heparin.   INFECTIOUS A:   No indication of infection P:   Monitor clinically.  ENDOCRINE A:   No known issues P:   SSI if glucose consistently > 180.  Family updated: None  Interdisciplinary Family Meeting v Palliative Care Meeting:  NA   Summary - Improving NIF, needs 5 ds of IV IgG, keep in ICU Underlying severe anxiety  The patient is critically ill with multiple organ systems failure and requires high complexity decision making for assessment and support, frequent evaluation and titration of therapies, application of advanced monitoring technologies and extensive interpretation of multiple databases. Critical Care Time devoted to patient care services described in this note independent of APP time is 31 minutes.    Kara Mead MD. Shade Flood. [place] [facility] Critical [facility] Pager [phone] If no response call [phone]   08/05/2014, 7:41 AM

## 2014-08-05 NOTE — Progress Notes (Signed)
Patient refused NIF and VC procedure this morning.  Will try again later.

## 2014-08-06 DIAGNOSIS — J9690 Respiratory failure, unspecified, unspecified whether with hypoxia or hypercapnia: Secondary | ICD-10-CM | POA: Insufficient documentation

## 2014-08-06 DIAGNOSIS — J9602 Acute respiratory failure with hypercapnia: Secondary | ICD-10-CM

## 2014-08-06 LAB — BASIC METABOLIC PANEL
ANION GAP: 5 (ref 5–15)
BUN: 13 mg/dL (ref 6–23)
CALCIUM: 9.1 mg/dL (ref 8.4–10.5)
CO2: 28 mmol/L (ref 19–32)
Chloride: 102 mEq/L (ref 96–112)
Creatinine, Ser: 0.87 mg/dL (ref 0.50–1.35)
Glucose, Bld: 110 mg/dL — ABNORMAL HIGH (ref 70–99)
Potassium: 3.6 mmol/L (ref 3.5–5.1)
SODIUM: 135 mmol/L (ref 135–145)

## 2014-08-06 LAB — MAGNESIUM: MAGNESIUM: 1.7 mg/dL (ref 1.5–2.5)

## 2014-08-06 LAB — PHOSPHORUS: Phosphorus: 5.1 mg/dL — ABNORMAL HIGH (ref 2.3–4.6)

## 2014-08-06 NOTE — Progress Notes (Signed)
Physical Therapy Evaluation Patient Details Name: Frank [name] MRN: [id number] DOB: [birth year] Today's Date: 08/06/2014   History of Present Illness  Patient is a 58 yo male admitted 08/01/14 with recurrent GBS.  Has completed 5-day IVIG today.  Patient initially admitted with GBS on 06/30/14, and was transferred to [facility] on 07/11/14.  Patient d/c home from CIR on 07/29/14 and was having worsening of symptoms (weakness, ataxia, difficulty breathing), leading to current admission.  PMH:  GBS, anxiety, OSA, HTN, DM.  Clinical Impression  Patient presents with recurrent GBS with general weakness, ataxic movement, and difficulty breathing, all impacting functional mobility and safety.  Will benefit from acute PT to maximize functional mobility prior to discharge.  Recommend SNF for continued therapy at discharge over longer period of time prior to return home with 24 hour assist.    Follow Up Recommendations SNF;Supervision/Assistance - 24 hour    Equipment Recommendations  Hospital bed    Recommendations for Other Services       Precautions / Restrictions Precautions Precautions: Fall Restrictions Weight Bearing Restrictions: No      Mobility  Bed Mobility Overal bed mobility: Needs Assistance Bed Mobility: Rolling;Sidelying to Sit;Sit to Supine Rolling: Supervision Sidelying to sit: Mod assist;+2 for physical assistance   Sit to supine: Mod assist;+2 for physical assistance   General bed mobility comments: Patient able to roll to both sides using bed rail with no physical assist.  Required mod assist to move LE's off of bed and to raise trunk to sitting position.  In sitting, patient with flexed trunk and decreased trunk control.  Assist for balance with dynamic activities.  Required +2 mod assist to return to supine.  Transfers Overall transfer level: Needs assistance Equipment used:  Psychiatric nurse) Transfers: Sit to/from Stand Sit to Stand: Mod assist;+2 physical  assistance         General transfer comment: Verbal cues and physical assist to place hands on bar of Stedy.  Required +2 mod assist to move to standing from bed, Stedy, and toilet.  Assist at hips for hip extension to move "seat" of Stedy in place.  Patient performed sit <> stand x4.  Patient with poor trunk control on Stedy when moving to toilet and to bed, requiring mod assist to maintain balance.  Ambulation/Gait                Stairs            Wheelchair Mobility    Modified Rankin (Stroke Patients Only)       Balance Overall balance assessment: Needs assistance Sitting-balance support: Bilateral upper extremity supported;Feet supported Sitting balance-Leahy Scale: Poor Sitting balance - Comments: Trunk in flexed position.  Posterior lean, and dyskinetic movements of trunk - requires min - mod assist to maintain balance. Postural control: Posterior lean Standing balance support: Bilateral upper extremity supported Standing balance-Leahy Scale: Zero Standing balance comment: Requires +2 mod assist to move to partial standing position.  Unable to maintain standing/balance.                             Pertinent Vitals/Pain Pain Assessment: No/denies pain    Home Living Family/patient expects to be discharged to:: Unsure Living Arrangements: Spouse/significant other Available Help at Discharge: Family;Personal care attendant;Available 24 hours/day (Significant other Legrand Como) works.  Have Aide during day.) Type of Home: House Home Access: Ramped entrance     Home Layout: One level Home Equipment: Gilford Rile -  2 wheels;Wheelchair - manual;Shower seat;Bedside commode      Prior Function Level of Independence: Needs assistance   Gait / Transfers Assistance Needed: When d/c home from CIR, could transfer into/out of w/c with min assist.  Was at Mod I level for w/c mobility.  ADL's / Homemaking Assistance Needed: Assist for all ADL's, meal prep.         Hand Dominance   Dominant Hand: Right    Extremity/Trunk Assessment   Upper Extremity Assessment: RUE deficits/detail;LUE deficits/detail RUE Deficits / Details: Decreased strength, grossly 3+/5.  Very ataxic movement.   RUE Sensation: history of peripheral neuropathy;decreased light touch LUE Deficits / Details: Decreased strength, grossly 3/5.  Very ataxic movement.   Lower Extremity Assessment: RLE deficits/detail;LLE deficits/detail RLE Deficits / Details: Strength grossly 3+/5.  Ataxic movements LLE Deficits / Details: Strength grossly 3/5 with ataxic movements  Cervical / Trunk Assessment: Other exceptions  Communication   Communication: Expressive difficulties (Due to difficulty with breathing.  )  Cognition Arousal/Alertness: Awake/alert Behavior During Therapy: Flat affect;Anxious (Tearful at end of session - "I'm scared"  Offered support) Overall Cognitive Status: Within Functional Limits for tasks assessed                      General Comments      Exercises        Assessment/Plan    PT Assessment Patient needs continued PT services  PT Diagnosis Difficulty walking;Generalized weakness   PT Problem List Decreased strength;Decreased activity tolerance;Decreased balance;Decreased mobility;Decreased coordination;Decreased knowledge of use of DME;Cardiopulmonary status limiting activity;Impaired sensation  PT Treatment Interventions DME instruction;Functional mobility training;Therapeutic activities;Therapeutic exercise;Balance training;Patient/family education   PT Goals (Current goals can be found in the Care Plan section) Acute Rehab PT Goals Patient Stated Goal: To get stronger PT Goal Formulation: With patient Time For Goal Achievement: 08/20/14 Potential to Achieve Goals: Fair    Frequency Min 3X/week   Barriers to discharge        Co-evaluation               End of Session Equipment Utilized During Treatment: Gait belt (Stedy  lift equipment) Activity Tolerance: Patient limited by fatigue Patient left: in bed;with call bell/phone within reach Nurse Communication: Mobility status;Need for lift equipment         Time: 1449-1533 PT Time Calculation (min) (ACUTE ONLY): 44 min   Charges:   PT Evaluation $Initial PT Evaluation Tier I: 1 Procedure PT Treatments $Therapeutic Activity: 23-37 mins   PT G Codes:        Despina Pole 08-23-2014, 6:24 PM Carita Pian. Sanjuana Kava, [place] Pager [phone]

## 2014-08-06 NOTE — Progress Notes (Signed)
Transfer to [facility] in am.  Spoke with [facility] flow manager and Dr. Wynelle Cleveland.  [facility] to pick up in am 12/28 0700 for primary svc.     Noe Gens, NP-C [facility] Pgr: [phone] or [phone]

## 2014-08-06 NOTE — Progress Notes (Signed)
Subjective: NIFs stable  Exam: Filed Vitals:   08/06/14 0700  BP: 114/63  Pulse: 62  Temp: 97.6 F (36.4 C)  Resp: 11   Gen: In bed, NAD MS: awake, alert, interactive and appropriate GQ:BVQXI Motor: bilateral LE weakness, 3 - 4/5. He is able to lift his right leg better than left.  Sensory:reduced to LT below the knees.    Impression: 58 yo M with GBS, recurrent worsening after initial treatment. He is within the window where I would still consider this GBS and he is being treated with IVIG.   Recommendations: 1)continue IVIG for total 5 days, today is last day.  2) PT, OT  Roland Rack, MD [facility] [phone]  If 7pm- 7am, please page neurology on call as listed in [place].

## 2014-08-06 NOTE — Evaluation (Signed)
Clinical/Bedside Swallow Evaluation Patient Details  Name: Frank [name] MRN: [id number] Date of Birth: 01/04/1956  Today's Date: 08/06/2014 Time: 9562-1308 SLP Time Calculation (min) (ACUTE ONLY): 17 min  Past Medical History:  Past Medical History  Diagnosis Date  . COLONIC POLYPS 02/01/2008  . DIABETES MELLITUS, TYPE II 05/20/2010  . HYPERLIPIDEMIA 10/09/2008  . ANXIETY DEPRESSION 02/01/2008  . ERECTILE DYSFUNCTION 10/09/2008  . ADD 10/09/2008  . SLEEP APNEA, OBSTRUCTIVE 02/01/2008  . MORTON'S NEUROMA 05/20/2010  . PERIPHERAL NEUROPATHY 05/20/2010  . Other specified forms of hearing loss 06/27/2009  . HYPERTENSION 10/09/2008  . HEMORRHOIDS 02/01/2008  . ALLERGIC RHINITIS 10/09/2008  . Stricture and stenosis of esophagus 02/02/2008  . GERD 02/01/2008  . HIATAL HERNIA 02/01/2008  . ERECTILE DYSFUNCTION, ORGANIC 05/20/2010  . WRIST PAIN, LEFT 12/05/2009  . FOOT PAIN, LEFT 05/20/2010  . PERIPHERAL EDEMA 05/20/2010  . DYSPNEA 03/12/2010  . Abdominal pain, unspecified site 01/19/2009  . Abdominal pain, left lower quadrant 06/06/2010  . Type II or unspecified type diabetes mellitus without mention of complication, uncontrolled 11/14/2010  . Guillain Barr syndrome    Past Surgical History:  Past Surgical History  Procedure Laterality Date  . Carpal tunnel release    . Rotator cuff repair    . Esophageal dilation  july 2009  . Esophagogastroduodenoscopy N/A 06/27/2014    Procedure: ESOPHAGOGASTRODUODENOSCOPY (EGD);  Surgeon: Lafayette Dragon, MD;  Location: [facility];  Service: Endoscopy;  Laterality: N/A;  . Eye surgery      catract surgery on both eyes   HPI:  58 y.o. with hx of Gillian-Barre with recent discharged after prolonged hospitalization for GBS with intubation (11/20-11/25), CIR and discharge home 07/29/14.  Admitted 08/01/14 for SOB and recurrent muscle weakness. 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 MBS 07/07/14  recommended Dys 3, thin. Currently consuming clear liquid diet with report from RN pt with decreasd appetite and taste.   Assessment / Plan / Recommendation Clinical Impression  No indications of pharyngeal dysphagia with consistencies assessed cup and straw. Adequate oral manipulation with solid texture. He is at mild-moderate risk due to recent history of GBS and continued weakness. Pt stated vocal quality became hoarse with decreased intensity 4 days prior to d/c from CIR, no intubation since 11/25.  Educated pt re: reflux precautions due to esophageal history. Recommend Dys 2 diet texture with thin liquids, pills with water and straws allowed with continued ST for safety/efficiency and upgrade when appropriate.    Aspiration Risk   (mild-moderate)    Diet Recommendation Dysphagia 2 (Fine chop);Thin liquid   Liquid Administration via: Cup;Straw Medication Administration: Whole meds with liquid Supervision: Patient able to self feed;Intermittent supervision to cue for compensatory strategies Compensations: Slow rate;Small sips/bites;Check for pocketing Postural Changes and/or Swallow Maneuvers: Seated upright 90 degrees;Upright 30-60 min after meal    Other  Recommendations Oral Care Recommendations: Oral care BID   Follow Up Recommendations   (TBD)    Frequency and Duration min 2x/week  2 weeks   Pertinent Vitals/Pain No pain      Swallow Study          Oral/Motor/Sensory Function Overall Oral Motor/Sensory Function:  (mild general weakness)   Ice Chips Ice chips: Not tested   Thin Liquid Thin Liquid: Within functional limits Presentation: Cup;Straw Pharyngeal  Phase Impairments:  (none)    Nectar Thick Nectar Thick Liquid: Not tested   Honey Thick Honey Thick Liquid: Not tested   Puree Puree:  (NT  pt does not like applesauce/pudding, no need to assess)   Solid   GO    Solid: Within functional limits       Houston Siren 08/06/2014,10:04  AM [phone]

## 2014-08-06 NOTE — Procedures (Signed)
NIF and VC not done at this time per family request. Pt is eating.

## 2014-08-06 NOTE — Progress Notes (Signed)
PULMONARY / CRITICAL CARE MEDICINE   Name: Frank [name] MRN: [id number] DOB: 21-Jul-1956    ADMISSION DATE:  08/01/2014 CONSULTATION DATE:  08/06/2014  REFERRING MD :  EDP  CHIEF COMPLAINT:  SOB  INITIAL PRESENTATION:  Frank [name] is a 58 y.o. M with PMH as outlined below which includes GBS.  He was recently admitted to [facility] 11/19 and required intubation 11/20 as well as PLEX.  He was extubated 11/25.  He was then discharged to rehab on 12/1 before being discharged home 12/19. Apparently, he was progressing in rehab and got back to walking fairly well.  3 - 4 days prior to discharge home, he re-developed symptoms of GBS flare. While he was home, his symptoms worsened prompting him to go to ED for further evaluation on 12/22.  His main complaint at the time was SOB.  He denied any cough, fevers/chills/sweats.  He has also continued to have motor weakness and states that this is similar to the last GBS flare he had. Neurology was consulted and they requested that pt be transferred to [facility] neuro ICU.  He was placed on BiPAP prior to transfer and PCCM was called for admission.   STUDIES:  CTA chest 12/22 >>> bibasilar atx, neg for PE  SIGNIFICANT EVENTS: 12/22 - admitted to [facility] neuro ICU for probable GBS flare 12/25 precedex stopped due to hypotension   SUBJECTIVE:    Afebrile, denies pain, breathing ok Sleepy this am but wakes up easily   VITAL SIGNS: Temp:  [97.6 F (36.4 C)-98.2 F (36.8 C)] 97.6 F (36.4 C) (12/27 0700) Pulse Rate:  [62-110] 62 (12/27 0700) Resp:  [8-20] 11 (12/27 0700) BP: (86-171)/(49-99) 114/63 mmHg (12/27 0700) SpO2:  [94 %-100 %] 100 % (12/27 0700) Weight:  [79.2 kg (174 lb 9.7 oz)] 79.2 kg (174 lb 9.7 oz) (12/27 0400) HEMODYNAMICS:   VENTILATOR SETTINGS:   INTAKE / OUTPUT: Intake/Output      12/26 0701 - 12/27 0700 12/27 0701 - 12/28 0700   P.O. 720    I.V. (mL/kg) 732.5 (9.2)    Other 260    IV Piggyback 300    Total Intake(mL/kg) 2012.5  (25.4)    Urine (mL/kg/hr) 1965 (1)    Total Output 1965     Net +47.5            PHYSICAL EXAMINATION: General: Elderly male, in NAD., able to lie supine Neuro: A&O x 3, non-focal. Bilateral LE weakness.hoarse voice, NIF -30 HEENT: [place]/AT. PERRL, sclerae anicteric.   Cardiovascular: RRR, no M/R/G.  Lungs: Respirations shallow, unlabored.  Diminished BS, no W/R/R. Abdomen: BS x 4, soft, NT/ND.  Musculoskeletal: No gross deformities, no edema.  Skin: Intact, warm, no rashes.  LABS:  CBC  Recent Labs Lab 08/03/14 0228 08/04/14 0321 08/05/14 0245  WBC 4.0 6.9 5.3  HGB 11.8* 12.3* 11.3*  HCT 34.2* 35.6* 32.7*  PLT 224 236 226   Coag's No results for input(s): APTT, INR in the last 168 hours. BMET  Recent Labs Lab 08/04/14 0321 08/05/14 0245 08/06/14 0240  NA 139 135 135  K 4.0 3.3* 3.6  CL 107 105 102  CO2 23 27 28   BUN 13 13 13   CREATININE 1.16 0.71 0.87  GLUCOSE 103* 106* 110*   Electrolytes  Recent Labs Lab 08/03/14 0228  08/04/14 0321 08/05/14 0245 08/06/14 0240  CALCIUM 8.9  < > 9.2 8.9 9.1  MG 1.8  --  1.8  --  1.7  PHOS 4.3  --  4.9*  --  5.1*  < > = values in this interval not displayed. Sepsis Markers No results for input(s): LATICACIDVEN, PROCALCITON, O2SATVEN in the last 168 hours. ABG  Recent Labs Lab 08/02/14 0419  PHART 7.388  PCO2ART 42.0  PO2ART 89.4   Liver Enzymes No results for input(s): AST, ALT, ALKPHOS, BILITOT, ALBUMIN in the last 168 hours. Cardiac Enzymes No results for input(s): TROPONINI, PROBNP in the last 168 hours. Glucose  Recent Labs Lab 08/01/14 2035  GLUCAP 99    Imaging No results found.  ASSESSMENT / PLAN:  NEUROLOGIC A:   Probable GBS flare / recurrent GBS P:   IVIG per neuro 5/5 Resumed outpatient cymbalta, ativan, robaxin, methylphenidate, oxycodone, lyrica. Mobilise  PULMONARY A: Dyspnea - secondary to GBS OSA P:   -NIF improving  -30 to -40 range Q8hr NIF's & vital  capacity's.  CARDIOVASCULAR A:  Hx HTN P:  Monitor hemodynamics.  RENAL A:   No acute issues P:    Replace electrolytes as indicated.  GASTROINTESTINAL A:   GERD Nutrition P:   Pepcid. Advance PO per swallow  HEMATOLOGIC A:   No acute issues VTE Prophylaxis P:  SCD's / Heparin.    Family updated: None  Interdisciplinary Family Meeting v Palliative Care Meeting:  NA   Summary - Improving NIF, can transfer to floor & to [facility] Underlying severe anxiety    Kara Mead MD. Shade Flood. [facility] Pager [phone] If no response [phone]   08/06/2014, 9:16 AM

## 2014-08-06 NOTE — Progress Notes (Signed)
NIF -36, VC 1.6L.  Pt provided very good effort.

## 2014-08-06 NOTE — Progress Notes (Signed)
NIF -39, VC 1.4L with very good effort. RT will continue to monitor.

## 2014-08-06 NOTE — Progress Notes (Signed)
NIF -26, FVC 1.2L. Pt had good effort but was extremely sleepy while performing breathing exercises.

## 2014-08-07 DIAGNOSIS — R0602 Shortness of breath: Secondary | ICD-10-CM

## 2014-08-07 MED ORDER — FAMOTIDINE 20 MG PO TABS
20.0000 mg | ORAL_TABLET | Freq: Two times a day (BID) | ORAL | Status: DC
  Administered 2014-08-07 – 2014-08-09 (×4): 20 mg via ORAL
  Filled 2014-08-07 (×4): qty 1

## 2014-08-07 NOTE — Progress Notes (Signed)
UR COMPLETED  

## 2014-08-07 NOTE — Evaluation (Signed)
Occupational Therapy Evaluation Patient Details Name: Frank [name] MRN: [id number] DOB: September 20, 1955 Today's Date: 08/07/2014    History of Present Illness Patient is a 58 yo male admitted 08/01/14 with recurrent GBS.  Has completed 5-day IVIG today.  Patient initially admitted with GBS on 06/30/14, and was transferred to [facility] on 07/11/14.  Patient d/c home from CIR on 07/29/14 and was having worsening of symptoms (weakness, ataxia, difficulty breathing), leading to current admission.  PMH:  GBS, anxiety, OSA, HTN, DM.   Clinical Impression   PT admitted with recurrent GBS. Pt currently with functional limitiations due to the deficits listed below (see OT problem list). PTA from home with MOd (A) for transfers and adls. Pt using w/c for mobility. Pt recent d/c from CIR 07/29/14 Pt will benefit from skilled OT to increase their independence and safety with adls and balance to allow discharge SNF. Ot to follow acutely for adl retraining and UE strengthening.      Follow Up Recommendations  SNF;Supervision/Assistance - 24 hour    Equipment Recommendations  Other (comment) (defer)    Recommendations for Other Services       Precautions / Restrictions Precautions Precautions: Fall Restrictions Weight Bearing Restrictions: No      Mobility Bed Mobility               General bed mobility comments: in chair on arrival  Transfers Overall transfer level: Needs assistance Equipment used: 1 person hand held assist Transfers: Sit to/from Stand Sit to Stand: Mod assist (unable to complete fully static stand)              Balance Overall balance assessment: Needs assistance Sitting-balance support: Bilateral upper extremity supported;Feet supported Sitting balance-Leahy Scale: Poor                                      ADL Overall ADL's : Needs assistance/impaired Eating/Feeding: Minimal assistance;Sitting Eating/Feeding Details (indicate cue type and  reason): ataxic UE movement Grooming: Sitting;Moderate assistance   Upper Body Bathing: Moderate assistance   Lower Body Bathing: Maximal assistance           Toilet Transfer: +2 for physical assistance Toilet Transfer Details (indicate cue type and reason): pt able to squat and push with BIL UE from chair to clear buttock only. PT unabel to fully static stand with 1 person (A)            General ADL Comments: Pt in chair on arrival and reports decr breath support with static sitting. Pt with nasal cannula on bed next to patient. Pt with UE and LE strength assessed. Pt sit<>Stand with BIL UE support on chair with MOD (A) to clear buttock. Pt unabel to achieve full upright posture. pT reports "light headed " with movement. Question orthostatic adjustement iwth standing. pt able to sit with bil UE support unsupported at edge of chair. Pt pleasant and eager to participate     Vision                     Perception     Praxis      Pertinent Vitals/Pain Pain Assessment: No/denies pain (reports feeling "anxious)     Hand Dominance Right   Extremity/Trunk Assessment Upper Extremity Assessment Upper Extremity Assessment: RUE deficits/detail;LUE deficits/detail RUE Deficits / Details: 3 out 5 AROM shoulder flexion, bicep 3+ out 5, tricep 3 out 5, gross grasp 3  out 5, ataxic movement  RUE Sensation: history of peripheral neuropathy;decreased light touch RUE Coordination: decreased fine motor;decreased gross motor LUE Deficits / Details: 3+out 5 AROM shoulder flexion, bicep 3+ /5 tricept 3+/5 gross hand grasp 3+ out 5- ataxic movement LUE Sensation: history of peripheral neuropathy;decreased light touch LUE Coordination: decreased fine motor;decreased gross motor   Lower Extremity Assessment Lower Extremity Assessment: Defer to PT evaluation   Cervical / Trunk Assessment Cervical / Trunk Assessment: Other exceptions Cervical / Trunk Exceptions: decr core support with  weakness   Communication Communication Communication: Expressive difficulties (DUE to dyspnea)   Cognition Arousal/Alertness: Awake/alert Behavior During Therapy: Flat affect;Anxious Overall Cognitive Status: Impaired/Different from baseline Area of Impairment: Orientation;Memory Orientation Level: Disoriented to;Time;Place Current Attention Level: Focused Memory: Decreased short-term memory         General Comments: pt states "happy birthday" to therapist. Pt uncertain of date / time / day of the week when asked. Pt reports month as January. Pt reading environemental clues when asked current location.    General Comments       Exercises       Shoulder Instructions      Home Living Family/patient expects to be discharged to:: Unsure Living Arrangements: Spouse/significant other Available Help at Discharge: Family;Personal care attendant;Available 24 hours/day Type of Home: House Home Access: Ramped entrance Entrance Stairs-Number of Steps: 3 Entrance Stairs-Rails: Right;Left;Can reach both Home Layout: One level     Bathroom Shower/Tub: Tub/shower unit;Curtain   Bathroom Toilet: Standard   How Accessible: Accessible via wheelchair Home Equipment: [place] - 2 wheels;Wheelchair - manual;Shower seat;Bedside commode   Additional Comments: Pt's partner works during day,   Lives With: Significant other    Prior Functioning/Environment Level of Independence: Needs assistance  Gait / Transfers Assistance Needed: When d/c home from CIR, could transfer into/out of w/c with min assist.  Was at Mod I level for w/c mobility. ADL's / Homemaking Assistance Needed: Assist for all ADL's, meal prep.        OT Diagnosis: Generalized weakness;Cognitive deficits   OT Problem List: Decreased strength;Decreased activity tolerance;Impaired balance (sitting and/or standing);Decreased cognition;Decreased safety awareness;Decreased knowledge of use of DME or AE   OT  Treatment/Interventions: Self-care/ADL training;Therapeutic exercise;Neuromuscular education;DME and/or AE instruction;Therapeutic activities;Cognitive remediation/compensation;Patient/family education;Balance training    OT Goals(Current goals can be found in the care plan section) Acute Rehab OT Goals Patient Stated Goal: to get some ativan OT Goal Formulation: Patient unable to participate in goal setting Time For Goal Achievement: 08/21/14 Potential to Achieve Goals: Good  OT Frequency: Min 2X/week   Barriers to D/C:            Co-evaluation              End of Session Equipment Utilized During Treatment: Gait belt Nurse Communication: Mobility status;Precautions  Activity Tolerance: Patient tolerated treatment well Patient left: in chair;with call bell/phone within reach;with chair alarm set   Time: 6295-2841 OT Time Calculation (min): 17 min Charges:  OT General Charges $OT Visit: 1 Procedure OT Evaluation $Initial OT Evaluation Tier I: 1 Procedure OT Treatments $Self Care/Home Management : 8-22 mins G-Codes:    Parke Poisson B Aug 09, 2014, 10:06 AM  Pager: [phone]

## 2014-08-07 NOTE — Progress Notes (Signed)
NEURO HOSPITALIST PROGRESS NOTE   SUBJECTIVE:                                                                                                                        Offers no new neurological complains today. Said that he tires easily when taking but denies SOB. Completed 5/5 days IVIG. Remains weak arms and legs. NIF, VC pending today.  OBJECTIVE:                                                                                                                           Vital signs in last 24 hours: Temp:  [97.8 F (36.6 C)-98.4 F (36.9 C)] 98.2 F (36.8 C) (12/28 0534) Pulse Rate:  [78-111] 86 (12/28 0534) Resp:  [12-16] 16 (12/28 0534) BP: (100-147)/(66-99) 118/77 mmHg (12/28 0534) SpO2:  [94 %-97 %] 96 % (12/28 0534) Weight:  [80 kg (176 lb 5.9 oz)] 80 kg (176 lb 5.9 oz) (12/28 0118)  Intake/Output from previous day: 12/27 0701 - 12/28 0700 In: 410 [P.O.:360; IV Piggyback:50] Out: 1520 [Urine:1520] Intake/Output this shift:   Nutritional status: DIET DYS 2  Past Medical History  Diagnosis Date  . COLONIC POLYPS 02/01/2008  . DIABETES MELLITUS, TYPE II 05/20/2010  . HYPERLIPIDEMIA 10/09/2008  . ANXIETY DEPRESSION 02/01/2008  . ERECTILE DYSFUNCTION 10/09/2008  . ADD 10/09/2008  . SLEEP APNEA, OBSTRUCTIVE 02/01/2008  . MORTON'S NEUROMA 05/20/2010  . PERIPHERAL NEUROPATHY 05/20/2010  . Other specified forms of hearing loss 06/27/2009  . HYPERTENSION 10/09/2008  . HEMORRHOIDS 02/01/2008  . ALLERGIC RHINITIS 10/09/2008  . Stricture and stenosis of esophagus 02/02/2008  . GERD 02/01/2008  . HIATAL HERNIA 02/01/2008  . ERECTILE DYSFUNCTION, ORGANIC 05/20/2010  . WRIST PAIN, LEFT 12/05/2009  . FOOT PAIN, LEFT 05/20/2010  . PERIPHERAL EDEMA 05/20/2010  . DYSPNEA 03/12/2010  . Abdominal pain, unspecified site 01/19/2009  . Abdominal pain, left lower quadrant 06/06/2010  . Type II or unspecified type diabetes mellitus without mention of complication,  uncontrolled 11/14/2010  . Guillain Barr syndrome     Physical exam: pleasant male in no apparent distress. Head: normocephalic. Neck: supple, no bruits, no JVD. Cardiac: no murmurs. Lungs: clear. Abdomen: soft, no tender, no mass. Extremities: no edema.  Neurologic  Exam:  MS: awake, alert, interactive and appropriate HW:TUUEK Motor: bilateral LE and UE weakness, 3 - 4/5. He is able to lift his right leg better than left.  Sensory:reduced to LT below the knees.  DTR's: generalized areflexia. Coordination and gait: unable to test.  Lab Results: Lab Results  Component Value Date/Time   CHOL 209* 05/04/2014 09:00 AM   Lipid Panel No results for input(s): CHOL, TRIG, HDL, CHOLHDL, VLDL, LDLCALC in the last 72 hours.  Studies/Results: No results found.  MEDICATIONS                                                                                                                        Scheduled: . antiseptic oral rinse  7 mL Mouth Rinse q12n4p  . aspirin EC  81 mg Oral Daily  . chlorhexidine  15 mL Mouth Rinse BID  . DULoxetine  30 mg Oral Daily  . famotidine (PEPCID) IV  20 mg Intravenous Q12H  . heparin  5,000 Units Subcutaneous 3 times per day  . LORazepam  0.5 mg Oral BID  . methylphenidate  36 mg Oral Q breakfast  . pregabalin  200 mg Oral TID    ASSESSMENT/PLAN:                                                                                                           58 yo M admitted 06/29/14 with GBS , recurrent worsening after initial treatment with PLEX, now completed 5 days IVIG yesterday. NIF, VC pending today but doesn't seem to be in respiratory distress. Will allow more time and see if he got any benefit from IVIG, but concern his illness could be following a CIDP pathway. Rehab. Will continue to follow.  Dorian Pod, MD [facility] [phone]  08/07/2014, 9:23 AM

## 2014-08-07 NOTE — Progress Notes (Addendum)
PROGRESS NOTE    Frank [name] MWU:[id number] DOB: 1956-05-23 DOA: 08/01/2014 PCP: Cathlean Cower, MD  HPI/Brief narrative 58 y.o. M with extensive PMH which includes GBS.He was recently admitted to [facility] 11/19 and required intubation 11/20 as well as PLEX. He was extubated 11/25.He was then discharged to rehab on 12/1 before being discharged home 12/19. Apparently, he was progressing in rehab and got back to walking fairly well.3 - 4 days prior to discharge home, he re-developed symptoms of GBS flare. While he was home, his symptoms worsened prompting him to go to ED for further evaluation on 12/22. His main complaint at the time was SOB. He has also continued to have motor weakness and states that this is similar to the last GBS flare he had. She was admitted to ICU by CCM. Neurology consulted and patient is status post 5 days IVIG. He was transferred to the floor and to [facility] service on 12/28.   Assessment/Plan:  1. Probable GBS flare/recurrent GBS: Management per neurology. Patient status post IVIG-completed 5 days on 12/27. Status post PLEX initial episode. Improving. Discussed with neurology on 12/28 who recommended monitoring in hospital for an additional 48 hours and if no further decline then DC to SNF for rehab.  2. Dyspnea: Secondary to GBS/OSA. NIF improving. 3. Essential hypertension: Controlled. 4. History of GERD: Pepcid. Advance diet as per speech therapy. 5. Hypokalemia: Replaced. 6. Chronic anemia: Stable   Code Status: Full Family Communication: discussed with brother Frank [name] on 12/28 Disposition Plan: SNF when medically stable   Consultants:  Neurology  CCM  Procedures:  None  Antibiotics:  None   Subjective: Still complaining of dyspnea on minimal activity/talking. Extremity strength improving.  Objective: Filed Vitals:   08/07/14 0118 08/07/14 0308 08/07/14 0534 08/07/14 1033  BP:  100/66 118/77 124/91  Pulse:  103 86 90  Temp:  98.3 F  (36.8 C) 98.2 F (36.8 C) 98.9 F (37.2 C)  TempSrc:  Oral Oral Oral  Resp:  16 16 16   Height:      Weight: 80 kg (176 lb 5.9 oz)     SpO2:  97% 96% 98%    Intake/Output Summary (Last 24 hours) at 08/07/14 1455 Last data filed at 08/07/14 0538  Gross per 24 hour  Intake    120 ml  Output   1200 ml  Net  -1080 ml   Filed Weights   08/05/14 0343 08/06/14 0400 08/07/14 0118  Weight: 77.6 kg (171 lb 1.2 oz) 79.2 kg (174 lb 9.7 oz) 80 kg (176 lb 5.9 oz)     Exam:  General exam: Pleasant middle-aged male lying comfortably supine in bed. Respiratory system: Clear. No increased work of breathing. Poor inspiratory effort. Cardiovascular system: S1 & S2 heard, RRR. No JVD, murmurs, gallops, clicks or pedal edema. Gastrointestinal system: Abdomen is nondistended, soft and nontender. Normal bowel sounds heard. Central nervous system: Alert and oriented. No focal neurological deficits. Extremities: Symmetric 4 x 5 power.   Data Reviewed: Basic Metabolic Panel:  Recent Labs Lab 08/02/14 0254 08/03/14 0228 08/03/14 0646 08/04/14 0321 08/05/14 0245 08/06/14 0240  NA 135 139 136 139 135 135  K 3.7 3.1* 3.4* 4.0 3.3* 3.6  CL 100 106 104 107 105 102  CO2 24 25 22 23 27 28   GLUCOSE 98 87 78 103* 106* 110*  BUN 19 11 11 13 13 13   CREATININE 0.94 0.68 0.72 1.16 0.71 0.87  CALCIUM 9.2 8.9 8.9 9.2 8.9 9.1  MG 2.0  1.8  --  1.8  --  1.7  PHOS 4.8* 4.3  --  4.9*  --  5.1*   Liver Function Tests: No results for input(s): AST, ALT, ALKPHOS, BILITOT, PROT, ALBUMIN in the last 168 hours. No results for input(s): LIPASE, AMYLASE in the last 168 hours. No results for input(s): AMMONIA in the last 168 hours. CBC:  Recent Labs Lab 08/01/14 1654 08/02/14 0254 08/03/14 0228 08/04/14 0321 08/05/14 0245  WBC 6.3 5.2 4.0 6.9 5.3  NEUTROABS 3.2  --   --   --   --   HGB 13.8 12.7* 11.8* 12.3* 11.3*  HCT 40.7 38.4* 34.2* 35.6* 32.7*  MCV 89.6 88.3 86.6 86.4 86.5  PLT 272 248 224 236  226   Cardiac Enzymes: No results for input(s): CKTOTAL, CKMB, CKMBINDEX, TROPONINI in the last 168 hours. BNP (last 3 results) No results for input(s): PROBNP in the last 8760 hours. CBG:  Recent Labs Lab 08/01/14 2035  GLUCAP 99    No results found for this or any previous visit (from the past 240 hour(s)).      Studies: No results found.      Scheduled Meds: . antiseptic oral rinse  7 mL Mouth Rinse q12n4p  . aspirin EC  81 mg Oral Daily  . chlorhexidine  15 mL Mouth Rinse BID  . DULoxetine  30 mg Oral Daily  . famotidine (PEPCID) IV  20 mg Intravenous Q12H  . heparin  5,000 Units Subcutaneous 3 times per day  . LORazepam  0.5 mg Oral BID  . methylphenidate  36 mg Oral Q breakfast  . pregabalin  200 mg Oral TID   Continuous Infusions:   Active Problems:   Recurrent GBS (Guillain-Barre syndrome)   Respiratory failure    Time spent: 30 minutes.    Vernell Leep, MD, FACP, FHM. [facility] Pager [phone]  If 7PM-7AM, please contact night-coverage [email or web address] Password [email or web address] 08/07/2014, 2:55 PM    LOS: 6 days

## 2014-08-07 NOTE — Progress Notes (Signed)
Chaplain initiated follow up with pt. Pt disappointed in relapse and being back in the hospital. Pt seeking assurance in his religious commitments. Pt and chaplain explored concerns around illness and death.  Chaplain offered prayer, provided emotional support, and read sacred text. Pt appreciates chaplain visits calling them "comforting." Chaplain spoke with pt nurse about possibility of pt visiting the chapel at some point in his stay. Chaplain will continue to follow. Page chaplain as needed.    08/07/14 1500  Clinical Encounter Type  Visited With Patient;Health care provider  Visit Type Follow-up;Spiritual support  Consult/Referral To Chaplain  Spiritual Encounters  Spiritual Needs Emotional;Prayer;[facility] text  Stress Factors  Patient Stress Factors Loss of control;Major life changes  Daenerys Buttram, Barbette Hair, Chaplain 08/07/2014 3:08 PM

## 2014-08-07 NOTE — Progress Notes (Signed)
Physical Therapy Treatment Patient Details Name: Kamon Fahr MRN: [id number] DOB: 10-21-1955 Today's Date: 08/07/2014    History of Present Illness Patient is a 58 yo male admitted 08/01/14 with recurrent GBS.  Has completed 5-day IVIG today.  Patient initially admitted with GBS on 06/30/14, and was transferred to [facility] on 07/11/14.  Patient d/c home from CIR on 07/29/14 and was having worsening of symptoms (weakness, ataxia, difficulty breathing), leading to current admission.  PMH:  GBS, anxiety, OSA, HTN, DM.    PT Comments    Good participation today, with progress in dynamic sitting balance;   Pt stated he would like to work on lateral scoot transfers to a wheelchair, and given progress with sitting balance, scoot transfers will likely be quite functional for him   Follow Up Recommendations  SNF;Supervision/Assistance - 24 hour     Equipment Recommendations  Hospital bed    Recommendations for Other Services       Precautions / Restrictions Precautions Precautions: Fall Restrictions Weight Bearing Restrictions: No    Mobility  Bed Mobility Overal bed mobility: Needs Assistance Bed Mobility: Rolling;Sidelying to Sit Rolling: Min assist Sidelying to sit: Mod assist;+2 for physical assistance       General bed mobility comments: Cues for technqiue; continues to require mod assist to elevate trunk to sitting; noted decr ability to motor plan coming to sit, with noted hip and trunk extension initially, improved with cues to bring "knees toward chest"  Transfers Overall transfer level: Needs assistance Equipment used: 2 person hand held assist (support at bil shoulders and belt/bed pad cradling pelvis) Transfers: Sit to/from Stand;Stand Pivot Transfers Sit to Stand: +2 physical assistance;Max assist Stand pivot transfers: +2 physical assistance;Max assist       General transfer comment: Required heavy bil knee blocking for stability during transitions secondary to  extreme weakness  Ambulation/Gait                 Stairs            Wheelchair Mobility    Modified Rankin (Stroke Patients Only)       Balance Overall balance assessment: Needs assistance Sitting-balance support: Bilateral upper extremity supported;Feet supported Sitting balance-Leahy Scale: Fair       Standing balance-Leahy Scale: Zero                      Cognition Arousal/Alertness: Awake/alert Behavior During Therapy: WFL for tasks assessed/performed Overall Cognitive Status: Impaired/Different from baseline Area of Impairment: Orientation;Memory Orientation Level: Disoriented to;Time;Place Current Attention Level: Focused Memory: Decreased short-term memory         General Comments: pt states "happy birthday" to therapist. Pt uncertain of date / time / day of the week when asked. Pt reports month as January. Pt reading environemental clues when asked current location.     Exercises Other Exercises Other Exercises: trunk mobility and stability exercises in sitting; Flex/extend against gentle resistence, and holding upright while resistence is given Other Exercises: scapular retraction in unsupported sitting    General Comments General comments (skin integrity, edema, etc.): pt demonstrated ability to weight shift to relieve pressure off sacrum      Pertinent Vitals/Pain Pain Assessment: No/denies pain    Home Living Family/patient expects to be discharged to:: Unsure Living Arrangements: Spouse/significant other Available Help at Discharge: Family;Personal care attendant;Available 24 hours/day Type of Home: House Home Access: Ramped entrance Entrance Stairs-Rails: Right;Left;Can reach both Home Layout: One level Home Equipment: Walker - 2 wheels;Wheelchair -  manual;Shower seat;Bedside commode Additional Comments: Pt's partner works during day,     Prior Function Level of Independence: Needs assistance  Gait / Transfers Assistance  Needed: When d/c home from CIR, could transfer into/out of w/c with min assist.  Was at Mod I level for w/c mobility. ADL's / Homemaking Assistance Needed: Assist for all ADL's, meal prep.     PT Goals (current goals can now be found in the care plan section) Acute Rehab PT Goals Patient Stated Goal: agreeable to getting up PT Goal Formulation: With patient Time For Goal Achievement: 08/20/14 Potential to Achieve Goals: Fair Progress towards PT goals: Progressing toward goals    Frequency  Min 3X/week    PT Plan Current plan remains appropriate    Co-evaluation             End of Session Equipment Utilized During Treatment: Gait belt;Other (comment) (bed pad) Activity Tolerance: Patient tolerated treatment well Patient left: in chair;with call bell/phone within reach;with chair alarm set     Time: 4734-0370 PT Time Calculation (min) (ACUTE ONLY): 25 min  Charges:  $Therapeutic Activity: 23-37 mins                    G Codes:      Quin Hoop 08/07/2014, 12:48 PM  Roney Marion, [place] Pager [phone] Office [phone]

## 2014-08-07 NOTE — Progress Notes (Signed)
RT note: Respiratory parameters obtained: NIF-(-40) cmH20/VC-(1.8)L, good effort.

## 2014-08-08 ENCOUNTER — Ambulatory Visit: Payer: BC Managed Care – PPO | Admitting: Internal Medicine

## 2014-08-08 NOTE — Clinical Social Work Psychosocial (Signed)
Clinical Social Work Department BRIEF PSYCHOSOCIAL ASSESSMENT 08/08/2014  Patient:  Frank [name], Frank [name]     Account Number:  [id number]     Admit date:  08/01/2014  Clinical Social Worker:  Glendon Axe, CLINICAL SOCIAL WORKER  Date/Time:  08/08/2014 03:17 PM  Referred by:  Physician  Date Referred:  08/08/2014 Referred for  SNF Placement   Other Referral:   Interview type:  Other - See comment Other interview type:    PSYCHOSOCIAL DATA Living Status:  SIGNIFICANT OTHER Admitted from facility:   Level of care:   Primary support name:  Althia Forts Primary support relationship to patient:  SPOUSE Degree of support available:   Strong    CURRENT CONCERNS Current Concerns  Post-Acute Placement   Other Concerns:    SOCIAL WORK ASSESSMENT / PLAN Clinical Social Worker spoke with patient's significant other, Frank [name] via telephone in reference to post-acute placement for SNF(with pt's premission). CSW explained CSW role and SNF process. Pt's significant other reported he and patient would like for pt to return home if possible and further stated he is currently awaiting a returned phone call from the Neurologist. Pt's significant other also stated if pt returned home 24 hour assistance and nursing care would be provided. Pt's significant other has requested [place] however CSW explained that [facility] is not contracted with pt's insurance carrier. CSW explained Letter of Guarantee Process and possible placement outside of [place]. Patient has been a resident of [place] in the past and does not wish to return. Patient has also been in [place] recently. Patient reported he would like to reture home with Home Health services. RNCM notified. Pt refusing SNF placement.   Assessment/plan status:  No Further Intervention Required Other assessment/ plan:   Information/referral to community resources:   SNF information/ list  provided.     PATIENT'S/FAMILY'S RESPONSE TO PLAN OF CARE: Pt lying in bed and oriented X4. Patient reported he is not interested in placement in a SNF and will return home. Pt's significant other agreed with pt's wishes. Pt and pt's family not agreeable to SNF placement. CSW signing off.     Glendon Axe, MSW, LCSWA [phone] 08/08/2014 3:40 PM

## 2014-08-08 NOTE — Progress Notes (Signed)
Pt obtained -42cmH20 NIF

## 2014-08-08 NOTE — Clinical Social Work Note (Signed)
Clinical Social Worker has assessed patient and pt's family. Full psychosocial to follow. CSW will continue to follow patient and pt's family for continued support and to facilitate pt's discharge needs once medically stable.   FL-2 on chart for MD signature.  Glendon Axe, MSW, LCSWA [phone] 08/08/2014 2:22 PM

## 2014-08-08 NOTE — Progress Notes (Signed)
Talked to patient about DCP; patient requested that I talk to his partner Legrand Como; Vevelyn Francois [phone]); Legrand Como stated that the patient will have 24 hr care at home provided by himself, the patient's brother and a Charity fundraiser, also they are active with [place] for [facility] services- HHPT/ OT/ nurses aide; patient wants to go home at discharge; Aneta Mins [phone]

## 2014-08-08 NOTE — Progress Notes (Signed)
Physical Therapy Treatment Patient Details Name: Frank [name] MRN: [id number] DOB: [birth year] Today's Date: 08/08/2014    History of Present Illness Patient is a 58 yo male admitted 08/01/14 with recurrent GBS.  Has completed 5-day IVIG today.  Patient initially admitted with GBS on 06/30/14, and was transferred to [facility] on 07/11/14.  Patient d/c home from CIR on 07/29/14 and was having worsening of symptoms (weakness, ataxia, difficulty breathing), leading to current admission.  PMH:  GBS, anxiety, OSA, HTN, DM.    PT Comments    Pt motivated and tolerated session well. Pt to benefit from SNF upon d/c however pt refusing. Notes indicate pt to have 24/7 supervision and will have [place] services including aide, PT, OT, and RN. Pt to con't to require maxA for safe transfers/mobility. Pt fatigues quickly with activity and pts R side is weaker than the L. Pt con't to be motivated to be as indep. As possible.    Follow Up Recommendations  SNF;Supervision/Assistance - 24 hour (however pt refusing, CM reports pt having 24/7 sup and HHA)     [place] [facility] bed    Recommendations for Other Services       Precautions / Restrictions Precautions Precautions: Fall Restrictions Weight Bearing Restrictions: No    Mobility  Bed Mobility               General bed mobility comments: pt on commode  Transfers Overall transfer level: Needs assistance Equipment used: 2 person hand held assist Transfers: Sit to/from Stand Sit to Stand: Mod assist;Max assist;+2 physical assistance Stand pivot transfers: +2 physical assistance;Max assist       General transfer comment: completed 6 sit to stands. pt required maxA at bilat LEs to maintain good alignment and maxA at trunk/pelvis for stability. pt able to push up with UEs however able to achieve full upright standing when able to pull self up vs push. Pt unable to step during std pvt to chair.   Ambulation/Gait                  Stairs            Wheelchair Mobility    Modified Rankin (Stroke Patients Only)       Balance Overall balance assessment: Needs assistance           Standing balance-Leahy Scale: Zero Standing balance comment: pt dependent on bilat UEs and 2 person assist to maintain standing                    Cognition Arousal/Alertness: Awake/alert Behavior During Therapy: WFL for tasks assessed/performed Overall Cognitive Status: Impaired/Different from baseline       Memory: Decreased short-term memory       Problem Solving: Slow processing      Exercises General Exercises - Lower Extremity Long Arc Quad: AROM;Both;10 reps Mini-Sqauts: AROM;Both;5 reps;Standing (x2 sets with maxA for optimal LE position maintaince) Other Exercises Other Exercises: worked on weight shifting in standing progressing to marching in place. maxA at trunk/pelvis for stability/support, pt with R LE weaker than L requiring increased assist when shifting to R side    General Comments        Pertinent Vitals/Pain Pain Assessment: No/denies pain    Home Living                      Prior Function            PT Goals (current goals can  now be found in the care plan section) Progress towards PT goals: Progressing toward goals    Frequency  Min 3X/week    PT Plan Discharge plan needs to be updated    Co-evaluation             End of Session Equipment Utilized During Treatment: Gait belt Activity Tolerance: Patient tolerated treatment well Patient left: in chair;with call bell/phone within reach     Time: 1357-1432 PT Time Calculation (min) (ACUTE ONLY): 35 min  Charges:  $Therapeutic Exercise: 8-22 mins $Therapeutic Activity: 8-22 mins                    G Codes:      Kingsley Callander 08/08/2014, 3:52 PM   Kittie Plater, PT, DPT Pager #: [phone] Office #: [phone]

## 2014-08-08 NOTE — Progress Notes (Signed)
NIF -40cmH20, and VC 1.7L

## 2014-08-08 NOTE — Progress Notes (Signed)
NEURO HOSPITALIST PROGRESS NOTE   SUBJECTIVE:                                                                                                                        Patient said that he is feeling stronger. No new neurological complains. 12/28 NIF-(-40) cmH20/VC-(1.8)L, good effort.   OBJECTIVE:                                                                                                                           Vital signs in last 24 hours: Temp:  [97.8 F (36.6 C)-98.9 F (37.2 C)] 97.8 F (36.6 C) (12/29 0948) Pulse Rate:  [90-103] 103 (12/29 0948) Resp:  [16-18] 18 (12/29 0948) BP: (119-182)/(68-99) 119/68 mmHg (12/29 0948) SpO2:  [94 %-98 %] 97 % (12/29 0948) Weight:  [79.2 kg (174 lb 9.7 oz)] 79.2 kg (174 lb 9.7 oz) (12/29 0500)  Intake/Output from previous day: 12/28 0701 - 12/29 0700 In: -  Out: 1450 [Urine:1450] Intake/Output this shift: Total I/O In: 120 [P.O.:120] Out: -  Nutritional status: DIET DYS 2  Past Medical History  Diagnosis Date  . COLONIC POLYPS 02/01/2008  . DIABETES MELLITUS, TYPE II 05/20/2010  . HYPERLIPIDEMIA 10/09/2008  . ANXIETY DEPRESSION 02/01/2008  . ERECTILE DYSFUNCTION 10/09/2008  . ADD 10/09/2008  . SLEEP APNEA, OBSTRUCTIVE 02/01/2008  . MORTON'S NEUROMA 05/20/2010  . PERIPHERAL NEUROPATHY 05/20/2010  . Other specified forms of hearing loss 06/27/2009  . HYPERTENSION 10/09/2008  . HEMORRHOIDS 02/01/2008  . ALLERGIC RHINITIS 10/09/2008  . Stricture and stenosis of esophagus 02/02/2008  . GERD 02/01/2008  . HIATAL HERNIA 02/01/2008  . ERECTILE DYSFUNCTION, ORGANIC 05/20/2010  . WRIST PAIN, LEFT 12/05/2009  . FOOT PAIN, LEFT 05/20/2010  . PERIPHERAL EDEMA 05/20/2010  . DYSPNEA 03/12/2010  . Abdominal pain, unspecified site 01/19/2009  . Abdominal pain, left lower quadrant 06/06/2010  . Type II or unspecified type diabetes mellitus without mention of complication, uncontrolled 11/14/2010  . Guillain Barr  syndrome    Physical exam: pleasant male in no apparent distress. Head: normocephalic. Neck: supple, no bruits, no JVD. Cardiac: no murmurs. Lungs: clear. Abdomen: soft, no tender, no mass. Extremities: no edema.  Neurologic Exam:  MS: awake, alert, interactive and appropriate MA:UQJFH  Motor: 4/5 upper and lower extremities bilaterally Sensory:reduced to LT below the knees.  DTR's: generalized areflexia. Coordination and gait: unable to test.  Lab Results: Lab Results  Component Value Date/Time   CHOL 209* 05/04/2014 09:00 AM   Lipid Panel No results for input(s): CHOL, TRIG, HDL, CHOLHDL, VLDL, LDLCALC in the last 72 hours.  Studies/Results: No results found.  MEDICATIONS                                                                                                                        Scheduled: . antiseptic oral rinse  7 mL Mouth Rinse q12n4p  . aspirin EC  81 mg Oral Daily  . chlorhexidine  15 mL Mouth Rinse BID  . DULoxetine  30 mg Oral Daily  . famotidine  20 mg Oral BID  . heparin  5,000 Units Subcutaneous 3 times per day  . LORazepam  0.5 mg Oral BID  . methylphenidate  36 mg Oral Q breakfast  . pregabalin  200 mg Oral TID    ASSESSMENT/PLAN:                                                                                                           58 yo M admitted 06/29/14 with GBS , recurrent worsening after initial treatment with PLEX, now completed 5 days IVIG yesterday. NIF, VC stable so far. Patient is improved in comparison to yesterday. Anticipate discharge tomorrow. Will follow up and make final recommendations.  Dorian Pod, MD [facility] [phone]  08/08/2014, 10:16 AM

## 2014-08-08 NOTE — Progress Notes (Addendum)
PROGRESS NOTE    Frank [name] PXT:[id number] DOB: 1955/08/18 DOA: 08/01/2014 PCP: Cathlean Cower, MD  HPI/Brief narrative 58 y.o. M with extensive PMH which includes GBS.He was recently admitted to [facility] 11/19 and required intubation 11/20 as well as PLEX. He was extubated 11/25.He was then discharged to rehab on 12/1 before being discharged home 12/19. Apparently, he was progressing in rehab and got back to walking fairly well.3 - 4 days prior to discharge home, he re-developed symptoms of GBS flare. While he was home, his symptoms worsened prompting him to go to ED for further evaluation on 12/22. His main complaint at the time was SOB. He has also continued to have motor weakness and states that this is similar to the last GBS flare he had. He was admitted to ICU by CCM. Neurology consulted and patient is status post 5 days IVIG. He was transferred to the floor and to [facility] service on 12/28.   Assessment/Plan:  1. Probable GBS flare/recurrent GBS: Management per neurology. Patient status post IVIG-completed 5 days on 12/27. Status post PLEX initial episode. Improving. Discussed with neurology on 12/28 who recommended monitoring in hospital for an additional 48 hours and if no further decline then DC to SNF for rehab. Neurology concerned that his illness could be following the CIDP pathway. Requested neurology to assist with arranging outpatient neurology follow-up when ready for discharge. 2. Dyspnea: Secondary to GBS/OSA. NIF improving. Improving. 3. Essential hypertension: Controlled. 4. History of GERD: Pepcid. Advance diet as per speech therapy. 5. Hypokalemia: Replaced. 6. Chronic anemia: Stable   Code Status: Full Family Communication: discussed with significant other Mr Danne Baxter 12/29. Disposition Plan: Patient and family state that they would prefer for patient to go home with home health therapy and apparently has 24/7 supervision and assistance at  home.   Consultants:  Neurology  CCM  Procedures:  None  Antibiotics:  None   Subjective: States dyspnea is improving and so is his strength in his extremities.  Objective: Filed Vitals:   08/07/14 2131 08/08/14 0144 08/08/14 0500 08/08/14 0603  BP: 141/88 182/99  126/95  Pulse: 92 103  92  Temp: 98.7 F (37.1 C) 98.4 F (36.9 C)  97.8 F (36.6 C)  TempSrc: Oral Oral  Oral  Resp: 18 16  16   Height:      Weight:   79.2 kg (174 lb 9.7 oz)   SpO2: 96% 94%  96%    Intake/Output Summary (Last 24 hours) at 08/08/14 0925 Last data filed at 08/08/14 0604  Gross per 24 hour  Intake      0 ml  Output   1450 ml  Net  -1450 ml   Filed Weights   08/06/14 0400 08/07/14 0118 08/08/14 0500  Weight: 79.2 kg (174 lb 9.7 oz) 80 kg (176 lb 5.9 oz) 79.2 kg (174 lb 9.7 oz)     Exam:  General exam: Pleasant middle-aged male lying comfortably supine in bed. Nasal twang to speech which is low volume. Respiratory system: Clear. No increased work of breathing. Poor inspiratory effort. Cardiovascular system: S1 & S2 heard, RRR. No JVD, murmurs, gallops, clicks or pedal edema. Gastrointestinal system: Abdomen is nondistended, soft and nontender. Normal bowel sounds heard. Central nervous system: Alert and oriented. No focal neurological deficits. Extremities: Symmetric 4 x 5 power. Left leg weaker than right.    Data Reviewed: Basic Metabolic Panel:  Recent Labs Lab 08/02/14 0254 08/03/14 0228 08/03/14 6270 08/04/14 0321 08/05/14 0245 08/06/14 0240  NA  135 139 136 139 135 135  K 3.7 3.1* 3.4* 4.0 3.3* 3.6  CL 100 106 104 107 105 102  CO2 24 25 22 23 27 28   GLUCOSE 98 87 78 103* 106* 110*  BUN 19 11 11 13 13 13   CREATININE 0.94 0.68 0.72 1.16 0.71 0.87  CALCIUM 9.2 8.9 8.9 9.2 8.9 9.1  MG 2.0 1.8  --  1.8  --  1.7  PHOS 4.8* 4.3  --  4.9*  --  5.1*   Liver Function Tests: No results for input(s): AST, ALT, ALKPHOS, BILITOT, PROT, ALBUMIN in the last 168 hours. No  results for input(s): LIPASE, AMYLASE in the last 168 hours. No results for input(s): AMMONIA in the last 168 hours. CBC:  Recent Labs Lab 08/01/14 1654 08/02/14 0254 08/03/14 0228 08/04/14 0321 08/05/14 0245  WBC 6.3 5.2 4.0 6.9 5.3  NEUTROABS 3.2  --   --   --   --   HGB 13.8 12.7* 11.8* 12.3* 11.3*  HCT 40.7 38.4* 34.2* 35.6* 32.7*  MCV 89.6 88.3 86.6 86.4 86.5  PLT 272 248 224 236 226   Cardiac Enzymes: No results for input(s): CKTOTAL, CKMB, CKMBINDEX, TROPONINI in the last 168 hours. BNP (last 3 results) No results for input(s): PROBNP in the last 8760 hours. CBG:  Recent Labs Lab 08/01/14 2035  GLUCAP 99    No results found for this or any previous visit (from the past 240 hour(s)).      Studies: No results found.      Scheduled Meds: . antiseptic oral rinse  7 mL Mouth Rinse q12n4p  . aspirin EC  81 mg Oral Daily  . chlorhexidine  15 mL Mouth Rinse BID  . DULoxetine  30 mg Oral Daily  . famotidine  20 mg Oral BID  . heparin  5,000 Units Subcutaneous 3 times per day  . LORazepam  0.5 mg Oral BID  . methylphenidate  36 mg Oral Q breakfast  . pregabalin  200 mg Oral TID   Continuous Infusions:   Active Problems:   Recurrent GBS (Guillain-Barre syndrome)   Respiratory failure   SOB (shortness of breath)    Time spent: 30 minutes.    Vernell Leep, MD, FACP, FHM. [facility] Pager [phone]  If 7PM-7AM, please contact night-coverage [email or web address] Password [email or web address] 08/08/2014, 9:25 AM    LOS: 7 days

## 2014-08-09 DIAGNOSIS — D649 Anemia, unspecified: Secondary | ICD-10-CM

## 2014-08-09 DIAGNOSIS — E876 Hypokalemia: Secondary | ICD-10-CM

## 2014-08-09 LAB — CBC
HCT: 39.5 % (ref 39.0–52.0)
HEMOGLOBIN: 13.6 g/dL (ref 13.0–17.0)
MCH: 30.6 pg (ref 26.0–34.0)
MCHC: 34.4 g/dL (ref 30.0–36.0)
MCV: 89 fL (ref 78.0–100.0)
Platelets: 271 10*3/uL (ref 150–400)
RBC: 4.44 MIL/uL (ref 4.22–5.81)
RDW: 12.7 % (ref 11.5–15.5)
WBC: 6.9 10*3/uL (ref 4.0–10.5)

## 2014-08-09 NOTE — Discharge Instructions (Signed)
Acute Respiratory Failure °Respiratory failure is when your lungs are not working well and your breathing (respiratory) system fails. When respiratory failure occurs, it is difficult for your lungs to get enough oxygen, get rid of carbon dioxide, or both. Respiratory failure can be life threatening.  °Respiratory failure can be acute or chronic. Acute respiratory failure is sudden, severe, and requires emergency medical treatment. Chronic respiratory failure is less severe, happens over time, and requires ongoing treatment.  °WHAT ARE THE CAUSES OF ACUTE RESPIRATORY FAILURE?  °Any problem affecting the heart or lungs can cause acute respiratory failure. Some of these causes include the following: °· Chronic bronchitis and emphysema (COPD).   °· Blood clot going to a lung (pulmonary embolism).   °· Having water in the lungs caused by heart failure, lung injury, or infection (pulmonary edema).   °· Collapsed lung (pneumothorax).   °· Pneumonia.   °· Pulmonary fibrosis.   °· Obesity.   °· Asthma.   °· Heart failure.   °· Any type of trauma to the chest that can make breathing difficult.   °· Nerve or muscle diseases making chest movements difficult. °WHAT SYMPTOMS SHOULD YOU WATCH FOR?  °If you have any of these signs or symptoms, you should seek immediate medical care:  °· You have shortness of breath (dyspnea) with or without activity.   °· You have rapid, fast breathing (tachypnea).   °· You are wheezing. °· You are unable to say more than a few words without having to catch your breath. °· You find it very difficult to function normally. °· You have a fast heart rate.   °· You have a bluish color to your finger or toe nail beds.   °· You have confusion or drowsiness or both.   °HOW WILL MY ACUTE RESPIRATORY FAILURE BE TREATED?  °Treatment of acute respiratory failure depends on the cause of the respiratory failure. Usually, you will stay in the intensive care unit so your breathing can be watched closely. Treatment  can include the following: °· Oxygen. Oxygen can be delivered through the following: °¨ Nasal cannula. This is small tubing that goes in your nose to give you oxygen. °¨ Face mask. A face mask covers your nose and mouth to give you oxygen. °· Medicine. Different medicines can be given to help with breathing. These can include: °¨ Nebulizers. Nebulizers deliver medicines to open the air passages (bronchodilators). These medicines help to open or relax the airways in the lungs so you can breathe better. They can also help loosen mucus from your lungs. °¨ Diuretics. Diuretic medicines can help you breathe better by getting rid of extra water in your body. °¨ Steroids. Steroid medicines can help decrease swelling (inflammation) in your lungs. °¨ Antibiotics. °· Chest tube. If you have a collapsed lung (pneumothorax), a chest tube is placed to help reinflate the lung. °· Non-invasive positive pressure ventilation (NPPV). This is a tight-fitting mask that goes over your nose and mouth. The mask has tubing that is attached to a machine. The machine blows air into the tubing, which helps to keep the tiny air sacs (alveoli) in your lungs open. This machine allows you to breathe on your own. °· Ventilator. A ventilator is a breathing machine. When on a ventilator, a breathing tube is put into the lungs. A ventilator is used when you can no longer breathe well enough on your own. You may have low oxygen levels or high carbon dioxide (CO2) levels in your blood. When you are on a ventilator, sedation and pain medicines are given to make you sleep   so your lungs can heal. °Document Released: 08/02/2013 Document Revised: 12/12/2013 Document Reviewed: 08/02/2013 °[employer]® Patient Information ©2015 [employer]. This information is not intended to replace advice given to you by your health care provider. Make sure you discuss any questions you have with your health care provider. ° °

## 2014-08-09 NOTE — Progress Notes (Signed)
Occupational Therapy Treatment Patient Details Name: Frank [name] MRN: [id number] DOB: 10/15/1955 Today's Date: 08/09/2014    History of present illness Patient is a 58 yo male admitted 08/01/14 with recurrent GBS.  Has completed 5-day IVIG today.  Patient initially admitted with GBS on 06/30/14, and was transferred to [facility] on 07/11/14.  Patient d/c home from CIR on 07/29/14 and was having worsening of symptoms (weakness, ataxia, difficulty breathing), leading to current admission.  PMH:  GBS, anxiety, OSA, HTN, DM.   OT comments  Pt making progress. Pt will be able to D/C home with 24/7 S and home health OT/PT. Agree with recommendation for hospital bed and sliding board. Pt to continue with HHOT.  Follow Up Recommendations  Home health OT;Supervision/Assistance - 24 hour    Equipment Recommendations       Recommendations for Other Services      Precautions / Restrictions Precautions Precautions: Fall Precaution Comments: h/o multiple falls       Mobility Bed Mobility Overal bed mobility: Modified Independent         General bed mobility comments: heavy reliance on rails for mobility, but hospital bed dramatically improves independence with bed mobility  Transfers        ADL                                         General ADL Comments: Discussed therapy needs and progression of therapy from home health to outpt servcies. Pt's strength appears to have improved. Limited by ataxia. Discussed trial using cuff weights with home health OT to help with ataxic movements. Educated pt in using support and supportive surfaces when positionoing sefl for self care to improve control of movements. Pt verbalized understanding.      Vision                     Perception     Praxis      Cognition   Behavior During Therapy: WFL for tasks assessed/performed Overall Cognitive Status: Impaired/Different from baseline     Current Attention Level:  Selective Memory: Decreased short-term memory  Following Commands: Follows multi-step commands with increased time Safety/Judgement: Decreased awareness of safety Awareness: Emergent Problem Solving: Slow processing General Comments: Patient slightly imulsive with task this session and needed cues to wait on assistance from therapist prior to attempting to get up .     Extremity/Trunk Assessment     LUE weaker than R          Exercises Other Exercises Other Exercises: rythmic stabilization exercises for trunk/pelvis and shoulder girdles Other Exercises: theraband  - level 1 elbow flex/ext using bed for support to compensate for ataxia Other Exercises: moving into prone position at bed level and then plank onto elbows   Shoulder Instructions       General Comments      Pertinent Vitals/ Pain       Pain Assessment: 0-10 Pain Score: 3  Pain Location: L shoulder Pain Descriptors / Indicators: Aching Pain Intervention(s): Monitored during session  Home Living                                          Prior Functioning/Environment              Frequency Min  2X/week     Progress Toward Goals  OT Goals(current goals can now be found in the care plan section)  Progress towards OT goals: Progressing toward goals  Acute Rehab OT Goals Patient Stated Goal: to be more independent OT Goal Formulation: With patient Time For Goal Achievement: 08/21/14 Potential to Achieve Goals: Good ADL Goals Pt Will Perform Eating: with supervision;with adaptive utensils;bed level Pt Will Perform Grooming: with min assist;sitting Pt Will Perform Upper Body Bathing: with min assist;sitting Pt Will Perform Lower Body Bathing: with mod assist;sit to/from stand Pt Will Transfer to Toilet: with mod assist;bedside commode Additional ADL Goal #1: Pt will complete UE HEP with min (A) to incr strength to 4 out 5 MMT ( bicep/ tricep/ deltoids)  Plan Discharge plan needs to be  updated    Co-evaluation                 End of Session     Activity Tolerance Patient tolerated treatment well   Patient Left in bed;with call bell/phone within reach   Nurse Communication Mobility status        Time: 1535-1600 OT Time Calculation (min): 25 min  Charges: OT General Charges $OT Visit: 1 Procedure OT Treatments $Therapeutic Activity: 23-37 mins  Dalin Caldera,HILLARY 08/09/2014, 4:26 PM   [facility], OTR/L  [phone] 08/09/2014

## 2014-08-09 NOTE — Discharge Summary (Signed)
Physician Discharge Summary  Benancio Osmundson EVO:[id number] DOB: 1955-09-11 DOA: 08/01/2014  PCP: Cathlean Cower, MD  Admit date: 08/01/2014 Discharge date: 08/09/2014  Time spent: 45 minutes  Recommendations for Outpatient Follow-up:  Patient will be discharged to home with home health services, including PT/OT. Patient will need to followup with his primary care physician and neurologist within 1-2 weeks of discharge. He should continue taking his medications as prescribed.  Patient should follow a dysphagia 2 diet.    Discharge Diagnoses:  GBS flare/recurrent GBS Dyspnea A central hypertension History of GERD Hypokalemia Chronic anemia  Discharge Condition: Stable  Diet recommendation: Dysphagia 2  Filed Weights   08/07/14 0118 08/08/14 0500 08/09/14 0500  Weight: 80 kg (176 lb 5.9 oz) 79.2 kg (174 lb 9.7 oz) 77.5 kg (170 lb 13.7 oz)    History of present illness:  On 08/02/2014 by Dr. Jennet Maduro Hyland Mollenkopf is a 58 y.o. M with PMH as outlined below which includes GBS. He was recently admitted to [facility] 11/19 and required intubation 11/20 as well as PLEX. He was extubated 11/25. He was then discharged to rehab on 12/1 before being discharged home 12/19. Apparently, he was progressing in rehab and got back to walking fairly well. 3 - 4 days prior to discharge home, he re-developed symptoms of GBS flare. While he was home, his symptoms worsened prompting him to go to ED for further evaluation on 12/22. His main complaint at the time was SOB. He denied any cough, fevers/chills/sweats. He has also continued to have motor weakness and states that this is similar to the last GBS flare he had. Neurology was consulted and they requested that pt be transferred to [facility] neuro ICU. He was placed on BiPAP prior to transfer and PCCM was called for admission.  Hospital Course:  GBS flare/recurrent GBS -Neurology consultation appreciated -Patient received 5 days of IVIG, completed on  08/06/2014 -Status post Plex initial episode -PT and OT were consulted and recommended nursing home/rehabilitation placement however patient and his partner opted to go home -Patient will need to see outpatient neurology at discharge  Dyspnea -Likely secondary to GBS and OSA -NIF improved  Essential hypertension -Controlled, continue home medications  GERD -Continue Pepcid -Continue dysphagia 2 diet  Hypokalemia -Replaced  Chronic anemia -Hemoglobin appears stable  Procedures: None  Consultations: PCCM Neurology  Discharge Exam: Filed Vitals:   08/09/14 0540  BP: 116/76  Pulse: 83  Temp: 98.3 F (36.8 C)  Resp: 16     General: Well developed, well nourished, NAD, appears stated age  HEENT: NCAT, mucous membranes moist.  Cardiovascular: S1 S2 auscultated, no rubs, murmurs or gallops. Regular rate and rhythm.  Respiratory: Clear to auscultation bilaterally with equal chest rise  Abdomen: Soft, nontender, nondistended, + bowel sounds  Extremities: warm dry without cyanosis clubbing or edema  Neuro: AAOx3, no focal deficits  Psych: Normal affect and demeanor with intact judgement and insight  Discharge Instructions      Discharge Instructions    Discharge instructions    Complete by:  As directed   Patient will be discharged to home with home health services, including PT/OT. Patient will need to followup with his primary care physician and neurologist within 1-2 weeks of discharge. He should continue taking his medications as prescribed.  Patient should follow a dysphagia 2 diet.            Medication List    TAKE these medications        allopurinol 100 MG  tablet  Commonly known as:  ZYLOPRIM  Take 1 tablet (100 mg total) by mouth daily.     aspirin EC 81 MG tablet  Take 1 tablet (81 mg total) by mouth daily.     diphenhydramine-acetaminophen 25-500 MG Tabs  Commonly known as:  TYLENOL PM  Take 1 tablet by mouth at bedtime as needed  (headache).     DULoxetine 30 MG capsule  Commonly known as:  CYMBALTA  Take 30 mg by mouth daily.     famotidine 40 MG tablet  Commonly known as:  PEPCID  Take 1 tablet (40 mg total) by mouth 2 (two) times daily.     glucose blood test strip  Commonly known as:  FREESTYLE TEST STRIPS  Use as instructed     LORazepam 0.5 MG tablet  Commonly known as:  ATIVAN  Take 1 tablet (0.5 mg total) by mouth 2 (two) times daily.     methocarbamol 500 MG tablet  Commonly known as:  ROBAXIN  Take 1 tablet (500 mg total) by mouth every 6 (six) hours as needed for muscle spasms.     methylphenidate 36 MG CR tablet  Commonly known as:  CONCERTA  Take 1 tablet (36 mg total) by mouth daily.     oxyCODONE 15 MG immediate release tablet  Commonly known as:  ROXICODONE  Take 1 tablet (15 mg total) by mouth every 6 (six) hours as needed for severe pain.     pantoprazole 40 MG tablet  Commonly known as:  PROTONIX  Take 1 tablet (40 mg total) by mouth daily.     polyethylene glycol packet  Commonly known as:  MIRALAX / GLYCOLAX  Take 17 g by mouth daily.     pregabalin 200 MG capsule  Commonly known as:  LYRICA  Take 1 capsule (200 mg total) by mouth 3 (three) times daily.     senna-docusate 8.6-50 MG per tablet  Commonly known as:  Senokot-S  Take 2 tablets by mouth 2 (two) times daily.       Allergies  Allergen Reactions  . Codeine Itching    REACTION: Itching  . Metformin And Related Other (See Comments)    GI upset, diarrhea  . Nucynta [Tapentadol]     somnolence  . Shellfish Allergy    Follow-up Information    Follow up with Cathlean Cower, MD. Schedule an appointment as soon as possible for a visit in 1 week.   Specialties:  Internal Medicine, Radiology   Why:  Hospital followup   Contact information:   [place] [address] [phone]        The results of significant diagnostics from this hospitalization (including imaging, microbiology,  ancillary and laboratory) are listed below for reference.    Significant Diagnostic Studies: Dg Chest 2 View  07/28/2014   CLINICAL DATA:  Acute onset of shortness of breath and weakness for 2-3 days. Initial encounter.  EXAM: CHEST  2 VIEW  COMPARISON:  Chest radiograph performed 07/05/2014  FINDINGS: The lungs are well-aerated. Minimal left basilar opacity likely reflects atelectasis. There is no evidence of pleural effusion or pneumothorax.  The heart is normal in size; the mediastinal contour is within normal limits. No acute osseous abnormalities are seen.  IMPRESSION: Minimal left basilar airspace opacity likely reflects atelectasis; lungs otherwise clear.   Electronically Signed   By: Garald Balding M.D.   On: 07/28/2014 22:22   Ct Angio Chest Pe W/cm &/or Wo Cm  08/01/2014   CLINICAL DATA:  Shortness of breath, weakness  EXAM: CT ANGIOGRAPHY CHEST WITH CONTRAST  TECHNIQUE: Multidetector CT imaging of the chest was performed using the standard protocol during bolus administration of intravenous contrast. Multiplanar CT image reconstructions and MIPs were obtained to evaluate the vascular anatomy.  CONTRAST:  176mL OMNIPAQUE IOHEXOL 350 MG/ML SOLN  COMPARISON:  Chest radiograph 07/28/2014, chest CT 06/17/2014  FINDINGS: Heart size at upper limits of normal. The great vessels are normal in caliber. Examination is suboptimal for evaluation for pulmonary embolism due to inhomogeneity of the contrast bolus particularly at the lung bases past the level of the third order pulmonary arteries. Allowing for this, no central focal filling defect is identified to suggest pulmonary embolism up to the third order pulmonary arteries. No lymphadenopathy. No pericardial or pleural effusion.  Minimal dependent bibasilar atelectasis is present. More focal curvilinear left lower lobe consolidation with air bronchogram formation could represent atelectasis although early pneumonia could appear similar. No pulmonary  nodule or mass. Central airways are patent. Degenerative change noted in the spine.  Review of the MIP images confirms the above findings.  IMPRESSION: Bibasilar dependent atelectasis with more focal linear probable atelectasis at the left lower lobe, although early pneumonia could appear similar.  Allowing for technique, no evidence for acute pulmonary embolism up to the third order pulmonary arteries.   Electronically Signed   By: Conchita Paris M.D.   On: 08/01/2014 18:22   Dg Chest Port 1 View  08/04/2014   CLINICAL DATA:  Shortness of breath and cough  EXAM: PORTABLE CHEST - 1 VIEW  COMPARISON:  08/03/2014  FINDINGS: Cardiac shadow is within normal limits. Stable atelectatic changes are noted at the left base. The overall inspiratory effort is poor. No new focal abnormality is seen.  IMPRESSION: Stable changes in the left base.  No new focal abnormality is noted.   Electronically Signed   By: Inez Catalina M.D.   On: 08/04/2014 07:52   Dg Chest Port 1 View  08/03/2014   CLINICAL DATA:  Respiratory failure.  EXAM: PORTABLE CHEST - 1 VIEW  COMPARISON:  CT 08/01/2014.  Chest x-ray 07/28/2014.  FINDINGS: Mediastinum and hilar structures are normal. Left lung base subsegmental atelectasis and or infiltrate again noted.No pleural effusion pneumothorax. Heart size normal. No acute bony abnormality.  IMPRESSION: Persistent left base subsegmental atelectasis and or infiltrate.   Electronically Signed   By: Marcello Moores  Register   On: 08/03/2014 07:29    Microbiology: No results found for this or any previous visit (from the past 240 hour(s)).   Labs: Basic Metabolic Panel:  Recent Labs Lab 08/03/14 0228 08/03/14 0646 08/04/14 0321 08/05/14 0245 08/06/14 0240  NA 139 136 139 135 135  K 3.1* 3.4* 4.0 3.3* 3.6  CL 106 104 107 105 102  CO2 25 22 23 27 28   GLUCOSE 87 78 103* 106* 110*  BUN 11 11 13 13 13   CREATININE 0.68 0.72 1.16 0.71 0.87  CALCIUM 8.9 8.9 9.2 8.9 9.1  MG 1.8  --  1.8  --  1.7    PHOS 4.3  --  4.9*  --  5.1*   Liver Function Tests: No results for input(s): AST, ALT, ALKPHOS, BILITOT, PROT, ALBUMIN in the last 168 hours. No results for input(s): LIPASE, AMYLASE in the last 168 hours. No results for input(s): AMMONIA in the last 168 hours. CBC:  Recent Labs Lab 08/03/14 0228 08/04/14 0321 08/05/14 0245 08/09/14 0820  WBC 4.0 6.9  5.3 6.9  HGB 11.8* 12.3* 11.3* 13.6  HCT 34.2* 35.6* 32.7* 39.5  MCV 86.6 86.4 86.5 89.0  PLT 224 236 226 271   Cardiac Enzymes: No results for input(s): CKTOTAL, CKMB, CKMBINDEX, TROPONINI in the last 168 hours. BNP: BNP (last 3 results) No results for input(s): PROBNP in the last 8760 hours. CBG: No results for input(s): GLUCAP in the last 168 hours.     SignedCristal Ford  [facility] 08/09/2014, 9:32 AM

## 2014-08-09 NOTE — Progress Notes (Signed)
Physical Therapy Treatment Patient Details Name: Frank [name] MRN: [id number] DOB: 04/22/1956 Today's Date: 08/09/2014    History of Present Illness Patient is a 58 yo male admitted 08/01/14 with recurrent GBS.  Has completed 5-day IVIG today.  Patient initially admitted with GBS on 06/30/14, and was transferred to [facility] on 07/11/14.  Patient d/c home from CIR on 07/29/14 and was having worsening of symptoms (weakness, ataxia, difficulty breathing), leading to current admission.  PMH:  GBS, anxiety, OSA, HTN, DM.    PT Comments    Patient is now planning to DC home today. He was agreeable to practice with slide board to decreased burden of care on caregivers. Patient is requesting hospital bed and slide board. Patient does have aide during the day from 8am-7pm.   Follow Up Recommendations  Home health PT;Supervision for mobility/OOB     Equipment Recommendations  Hospital bed (slide board)    Recommendations for Other Services       Precautions / Restrictions Precautions Precautions: Fall    Mobility  Bed Mobility     Rolling: Min guard Sidelying to sit: Min guard       General bed mobility comments: Requires use of rail and increased effort.   Transfers Overall transfer level: Needs assistance Equipment used: 2 person hand held assist Transfers: Lateral/Scoot Transfers Sit to Stand: Mod assist;Max assist;+2 physical assistance        Lateral/Scoot Transfers: Mod assist;With slide board General transfer comment: Patient stood x2 with max assist x2 for blcoking LEs and maintaining balance. Patient able to attempt slide board transfer to [facility] and back to bed with Mod A for positoining and assist to slide. Patient given visual and verbal cues for technique.   Ambulation/Gait                 Stairs            Wheelchair Mobility    Modified Rankin (Stroke Patients Only)       Balance     Sitting balance-Leahy Scale: Fair                              Cognition Arousal/Alertness: Awake/alert Behavior During Therapy: WFL for tasks assessed/performed;Impulsive Overall Cognitive Status: Impaired/Different from baseline       Memory: Decreased short-term memory         General Comments: Patient slightly imulsive with task this session and needed cues to wait on assistance from therapist prior to attempting to get up .     Exercises      General Comments        Pertinent Vitals/Pain Pain Assessment: No/denies pain    Home Living                      Prior Function            PT Goals (current goals can now be found in the care plan section) Progress towards PT goals: Progressing toward goals    Frequency  Min 3X/week    PT Plan Discharge plan needs to be updated    Co-evaluation             End of Session Equipment Utilized During Treatment: Gait belt Activity Tolerance: Patient tolerated treatment well Patient left: in bed;with call bell/phone within reach     Time: 1105-1141 PT Time Calculation (min) (ACUTE ONLY): 36 min  Charges:  G Codes:      Jacqualyn Posey 08/09/2014, 2:56 PM 08/09/2014 Jacqualyn Posey PTA [phone] pager [phone] office

## 2014-08-09 NOTE — Progress Notes (Signed)
Patient requested to have a hospital bed with slide board at home; [facility] called - DME ordered; patient stated that he will be okay to go home and have the hospital bed delivered after discharge; Mindi Slicker RN,BSN,MHA [phone]

## 2014-08-09 NOTE — Progress Notes (Signed)
Reviewed discharge documents with patient and partner. Home health, PT and OT will follow up. Pt was transported to front lobby via wheelchair at 1830 and transported to  home in [place] by partner.

## 2014-08-09 NOTE — Progress Notes (Signed)
Speech Language Pathology Treatment: Dysphagia  Patient Details Name: Frank [name] MRN: [id number] DOB: 1956-01-29 Today's Date: 08/09/2014 Time: 1135-1150 SLP Time Calculation (min) (ACUTE ONLY): 15 min  Assessment / Plan / Recommendation Clinical Impression  Brief session for dysphagia treatment; pt scheduled for discharge home today. Observed with Dys 3 texture for possible upgrade without oral or pharyngeal difficulty. Discussed diet textures and SLP will upgrade to Dys 3 ( mechanical soft) and educated on appropriate texture for Dys 3.    HPI HPI: 58 y.o. with hx of Gillian-Barre with recent discharged after prolonged hospitalization for GBS with intubation (11/20-11/25), CIR and discharge home 07/29/14.  Admitted 08/01/14 for SOB and recurrent muscle weakness. 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 MBS 07/07/14 recommended Dys 3, thin. Currently consuming clear liquid diet with report from RN pt with decreasd appetite and taste.   Pertinent Vitals Pain Assessment: No/denies pain  SLP Plan  All goals met;Discharge SLP treatment due to (comment)    Recommendations Diet recommendations: Thin liquid;Dysphagia 3 (mechanical soft) Liquids provided via: Cup;Straw Medication Administration: Whole meds with liquid Supervision: Patient able to self feed Compensations: Slow rate;Small sips/bites;Check for pocketing Postural Changes and/or Swallow Maneuvers: Seated upright 90 degrees;Upright 30-60 min after meal              Oral Care Recommendations: Oral care BID Follow up Recommendations: None Plan: All goals met;Discharge SLP treatment due to (comment)         Mick Sell, Orbie Pyo 08/09/2014, 1:32 PM  Orbie Pyo Colvin Caroli.[employer] [phone]

## 2014-08-15 ENCOUNTER — Telehealth: Payer: Self-pay | Admitting: Internal Medicine

## 2014-08-15 NOTE — Telephone Encounter (Signed)
Caregiver informed and she informed he has appt. With PCP on 08/29/14.

## 2014-08-15 NOTE — Telephone Encounter (Signed)
HHRN informed 

## 2014-08-15 NOTE — Telephone Encounter (Signed)
Advise if patient needs to be on BP medication.

## 2014-08-15 NOTE — Telephone Encounter (Signed)
Needs ROV f/u with me, as recommended for 1-2 wks after his most recent discharge from hospital, thanks

## 2014-08-15 NOTE — Telephone Encounter (Signed)
Frank [name] [phone]  Need verbal orders as well

## 2014-08-15 NOTE — Telephone Encounter (Signed)
Izora Gala OT with [place]   2x a week for 6 weeks 1 week for Adl's and strengthing

## 2014-08-15 NOTE — Telephone Encounter (Signed)
PT stated BP today 160/80, (145/86 on the 4th and 128/80).  Patient is currently not taking anything for his BP.  Advise please

## 2014-08-15 NOTE — Telephone Encounter (Signed)
Ok for verbal for all 

## 2014-08-16 ENCOUNTER — Telehealth: Payer: Self-pay | Admitting: *Deleted

## 2014-08-16 NOTE — Telephone Encounter (Signed)
Ok for verbal 

## 2014-08-16 NOTE — Telephone Encounter (Signed)
HHRN informed 

## 2014-08-16 NOTE — Telephone Encounter (Signed)
Let msg on triage stating seeing pt for PT, but requesting verbal order for [facility] to come out pt has some sores on his bottom area. Is this ok...Frank [name]

## 2014-08-20 NOTE — Addendum Note (Signed)
Addended by: Biagio Borg on: 08/20/2014 06:49 PM   Modules accepted: Miquel Dunn

## 2014-08-22 ENCOUNTER — Telehealth: Payer: Self-pay | Admitting: *Deleted

## 2014-08-22 NOTE — Telephone Encounter (Signed)
Received home health certification and plan of care via fax from [place]. Forwarded to Dr. Jenny Reichmann. JG//CMA

## 2014-08-29 ENCOUNTER — Other Ambulatory Visit (INDEPENDENT_AMBULATORY_CARE_PROVIDER_SITE_OTHER): Payer: BC Managed Care – PPO

## 2014-08-29 ENCOUNTER — Encounter: Payer: Self-pay | Admitting: Internal Medicine

## 2014-08-29 ENCOUNTER — Ambulatory Visit (INDEPENDENT_AMBULATORY_CARE_PROVIDER_SITE_OTHER): Payer: BC Managed Care – PPO | Admitting: Internal Medicine

## 2014-08-29 VITALS — BP 140/70 | HR 86 | Temp 98.4°F | Ht 69.0 in | Wt 193.2 lb

## 2014-08-29 DIAGNOSIS — E119 Type 2 diabetes mellitus without complications: Secondary | ICD-10-CM

## 2014-08-29 DIAGNOSIS — I10 Essential (primary) hypertension: Secondary | ICD-10-CM

## 2014-08-29 DIAGNOSIS — E1142 Type 2 diabetes mellitus with diabetic polyneuropathy: Secondary | ICD-10-CM | POA: Diagnosis not present

## 2014-08-29 DIAGNOSIS — M545 Low back pain: Secondary | ICD-10-CM | POA: Diagnosis not present

## 2014-08-29 DIAGNOSIS — H9190 Unspecified hearing loss, unspecified ear: Secondary | ICD-10-CM

## 2014-08-29 DIAGNOSIS — R609 Edema, unspecified: Secondary | ICD-10-CM

## 2014-08-29 DIAGNOSIS — G610 Guillain-Barre syndrome: Secondary | ICD-10-CM | POA: Diagnosis not present

## 2014-08-29 DIAGNOSIS — G4733 Obstructive sleep apnea (adult) (pediatric): Secondary | ICD-10-CM

## 2014-08-29 DIAGNOSIS — IMO0002 Reserved for concepts with insufficient information to code with codable children: Secondary | ICD-10-CM

## 2014-08-29 DIAGNOSIS — E1165 Type 2 diabetes mellitus with hyperglycemia: Secondary | ICD-10-CM

## 2014-08-29 LAB — BASIC METABOLIC PANEL
BUN: 12 mg/dL (ref 6–23)
CHLORIDE: 102 meq/L (ref 96–112)
CO2: 32 mEq/L (ref 19–32)
Calcium: 9.2 mg/dL (ref 8.4–10.5)
Creatinine, Ser: 0.81 mg/dL (ref 0.40–1.50)
GFR: 102.95 mL/min (ref >60.00)
GLUCOSE: 106 mg/dL — AB (ref 70–99)
POTASSIUM: 4.2 meq/L (ref 3.5–5.1)
SODIUM: 138 meq/L (ref 135–145)

## 2014-08-29 LAB — HEPATIC FUNCTION PANEL
ALK PHOS: 78 U/L (ref 39–117)
ALT: 20 U/L (ref 0–53)
AST: 20 U/L (ref 0–37)
Albumin: 3.4 g/dL — ABNORMAL LOW (ref 3.5–5.2)
Bilirubin, Direct: 0 mg/dL (ref 0.0–0.3)
Total Bilirubin: 0.3 mg/dL (ref 0.2–1.2)
Total Protein: 6.7 g/dL (ref 6.0–8.3)

## 2014-08-29 LAB — LIPID PANEL
CHOL/HDL RATIO: 4
Cholesterol: 205 mg/dL — ABNORMAL HIGH (ref 0–200)
HDL: 47.2 mg/dL (ref >39.00)
NONHDL: 157.8
TRIGLYCERIDES: 344 mg/dL — AB (ref 0.0–149.0)
VLDL: 68.8 mg/dL — ABNORMAL HIGH (ref 0.0–40.0)

## 2014-08-29 LAB — GLUCOSE, POCT (MANUAL RESULT ENTRY): POC GLUCOSE: 106 mg/dL — AB (ref 70–99)

## 2014-08-29 LAB — HEMOGLOBIN A1C: HEMOGLOBIN A1C: 5.8 % (ref 4.6–6.5)

## 2014-08-29 LAB — LDL CHOLESTEROL, DIRECT: Direct LDL: 111 mg/dL

## 2014-08-29 MED ORDER — HYDROCHLOROTHIAZIDE 12.5 MG PO CAPS: 12.5000 mg | ORAL_CAPSULE | Freq: Every day | ORAL | Status: DC

## 2014-08-29 MED ORDER — TRAMADOL HCL 50 MG PO TABS: 50.0000 mg | ORAL_TABLET | Freq: Three times a day (TID) | ORAL | Status: DC | PRN

## 2014-08-29 MED ORDER — FAMOTIDINE 40 MG PO TABS: 40.0000 mg | ORAL_TABLET | Freq: Two times a day (BID) | ORAL | Status: DC

## 2014-08-29 NOTE — Assessment & Plan Note (Signed)
cbg 106 in office today, has lost considerable wt, most recent a1c normal nov 2015, asympt, ok to stay off oha for now,  to f/u any worsening symptoms or concerns Lab Results  Component Value Date   HGBA1C 5.9* 06/21/2014

## 2014-08-29 NOTE — Assessment & Plan Note (Signed)
With small edema now recurred off hct 25 qd prior tx; ok to re-start at hct 12.5 mg,  to f/u any worsening symptoms or concerns, cont to monitor BP at home . BP Readings from Last 3 Encounters:  08/29/14 140/70  08/09/14 131/83  08/01/14 128/88

## 2014-08-29 NOTE — Patient Instructions (Addendum)
OK to stop the oxycodone  Please take all new medication as prescribed - the tramadol as prescribed, and the fluid pill for swelling/and blood pressure  Please continue all other medications as before, and refills have been done if requested.  Please have the pharmacy call with any other refills you may need.  Please keep your appointments with your specialists as you may have planned  Please return in 6 months, or sooner if needed

## 2014-08-29 NOTE — Progress Notes (Signed)
Subjective:    Patient ID: Frank [name], male    DOB: 09/21/1955, 59 y.o.   MRN: [id number]  HPI   Here to f/u after 2 recent hospns nov and dec 2015 with GBS, intubated x 5 days first visit with plasmapheresis, then second with Bipap and IVIG.  D/c home with PT/OT, overall doing better.  PT eval indicates still needs help with ADLS and transfers, with largest issue still LE weakness.  Did transfer by himself yesterday in the shower. Pt denies chest pain, increased sob or doe, wheezing, orthopnea, PND, increased LE swelling, palpitations, dizziness or syncope.except LE swelling ha returned off his recent diuretic Was on nucenta but no longer due to fall risk.  CBG was 106.   Pt denies polydipsia, polyuria, or low sugar symptoms such as weakness or confusion improved with po intake.  Pt states overall good compliance with meds, trying to follow lower cholesterol, diabetic diet, wt overall down from 215 to 162 current. No longer on OHA. Last a1c nov 2015 5.9.  Pt denies chest pain, increased sob or doe, wheezing, orthopnea, PND, increased LE swelling, palpitations, dizziness or syncope, has had signfiacnt labile BP and orthostasis, gait disturbance related to the GBS BP Readings from Last 3 Encounters:  08/29/14 140/70  08/09/14 131/83  08/01/14 128/88   Past Medical History  Diagnosis Date  . COLONIC POLYPS 02/01/2008  . DIABETES MELLITUS, TYPE II 05/20/2010  . HYPERLIPIDEMIA 10/09/2008  . ANXIETY DEPRESSION 02/01/2008  . ERECTILE DYSFUNCTION 10/09/2008  . ADD 10/09/2008  . SLEEP APNEA, OBSTRUCTIVE 02/01/2008  . MORTON'S NEUROMA 05/20/2010  . PERIPHERAL NEUROPATHY 05/20/2010  . Other specified forms of hearing loss 06/27/2009  . HYPERTENSION 10/09/2008  . HEMORRHOIDS 02/01/2008  . ALLERGIC RHINITIS 10/09/2008  . Stricture and stenosis of esophagus 02/02/2008  . GERD 02/01/2008  . HIATAL HERNIA 02/01/2008  . ERECTILE DYSFUNCTION, ORGANIC 05/20/2010  . WRIST PAIN, LEFT 12/05/2009  . FOOT PAIN, LEFT  05/20/2010  . PERIPHERAL EDEMA 05/20/2010  . DYSPNEA 03/12/2010  . Abdominal pain, unspecified site 01/19/2009  . Abdominal pain, left lower quadrant 06/06/2010  . Type II or unspecified type diabetes mellitus without mention of complication, uncontrolled 11/14/2010  . Guillain Barr syndrome    Past Surgical History  Procedure Laterality Date  . Carpal tunnel release    . Rotator cuff repair    . Esophageal dilation  july 2009  . Esophagogastroduodenoscopy N/A 06/27/2014    Procedure: ESOPHAGOGASTRODUODENOSCOPY (EGD);  Surgeon: Lafayette Dragon, MD;  Location: [facility];  Service: Endoscopy;  Laterality: N/A;  . Eye surgery      catract surgery on both eyes    reports that he has never smoked. He has never used smokeless tobacco. He reports that he drinks alcohol. He reports that he uses illicit drugs (Methaqualone). family history includes Depression in his mother; Diabetes in his brother and mother; Heart disease in his mother; Hyperlipidemia in his mother. There is no history of Colon cancer. Allergies  Allergen Reactions  . Codeine Itching    REACTION: Itching  . Metformin And Related Other (See Comments)    GI upset, diarrhea  . Nucynta [Tapentadol]     somnolence  . Shellfish Allergy    Current Outpatient Prescriptions on File Prior to Visit  Medication Sig Dispense Refill  . allopurinol (ZYLOPRIM) 100 MG tablet Take 1 tablet (100 mg total) by mouth daily. 30 tablet 1  . aspirin EC 81 MG tablet Take 1 tablet (81 mg total) by mouth  daily.    . diphenhydramine-acetaminophen (TYLENOL PM) 25-500 MG TABS Take 1 tablet by mouth at bedtime as needed (headache).    . DULoxetine (CYMBALTA) 30 MG capsule Take 30 mg by mouth daily.     Marland Kitchen glucose blood (FREESTYLE TEST STRIPS) test strip Use as instructed 100 each 12  . LORazepam (ATIVAN) 0.5 MG tablet Take 1 tablet (0.5 mg total) by mouth 2 (two) times daily. 60 tablet 0  . methocarbamol (ROBAXIN) 500 MG tablet Take 1 tablet (500 mg  total) by mouth every 6 (six) hours as needed for muscle spasms. 75 tablet 1  . methylphenidate 36 MG PO CR tablet Take 1 tablet (36 mg total) by mouth daily. 30 tablet 0  . pantoprazole (PROTONIX) 40 MG tablet Take 1 tablet (40 mg total) by mouth daily. 30 tablet 0  . polyethylene glycol (MIRALAX / GLYCOLAX) packet Take 17 g by mouth daily. 30 each 0  . pregabalin (LYRICA) 200 MG capsule Take 1 capsule (200 mg total) by mouth 3 (three) times daily. 90 capsule 1  . senna-docusate (SENOKOT-S) 8.6-50 MG per tablet Take 2 tablets by mouth 2 (two) times daily. 120 tablet 1   No current facility-administered medications on file prior to visit.   Review of Systems  Constitutional: Negative for unusual diaphoresis or other sweats  HENT: Negative for ringing in ear Eyes: Negative for double vision or worsening visual disturbance.  Respiratory: Negative for choking and stridor.   Gastrointestinal: Negative for vomiting or other signifcant bowel change Genitourinary: Negative for hematuria or decreased urine volume.  Musculoskeletal: Negative for other MSK pain or swelling Skin: Negative for color change and worsening wound.  Neurological: Negative for tremors and numbness other than noted  Psychiatric/Behavioral: Negative for decreased concentration or agitation other than above       Objective:   Physical Exam BP 140/70 mmHg  Pulse 86  Temp(Src) 98.4 F (36.9 C) (Oral)  Ht 5\' 9"  (1.753 m)  Wt 193 lb 4 oz (87.658 kg)  BMI 28.53 kg/m2  SpO2 92% VS noted,  Constitutional: Pt appears well-developed, well-nourished.  HENT: Head: NCAT.  Right Ear: External ear normal.  Left Ear: External ear normal.  Eyes: . Pupils are equal, round, and reactive to light. Conjunctivae and EOM are normal Neck: Normal range of motion. Neck supple.  Cardiovascular: Normal rate and regular rhythm.   Pulmonary/Chest: Effort normal and breath sounds without rales or wheezing.  Neurological: Pt is alert. At  baseline confused , motor with 4+/5 LE's Skin: Skin is warm. No rash, trace to 1+ edema LE's to mid legs Psychiatric: Pt behavior is normal. No agitation.     Assessment & Plan:

## 2014-08-29 NOTE — Progress Notes (Signed)
Pre visit review using our clinic review tool, if applicable. No additional management support is needed unless otherwise documented below in the visit note. 

## 2014-08-29 NOTE — Assessment & Plan Note (Signed)
For HCT 12.5 mg qd as above

## 2014-09-04 ENCOUNTER — Other Ambulatory Visit: Payer: Self-pay | Admitting: Physical Medicine and Rehabilitation

## 2014-09-05 ENCOUNTER — Other Ambulatory Visit: Payer: Self-pay | Admitting: Internal Medicine

## 2014-09-06 ENCOUNTER — Telehealth: Payer: Self-pay | Admitting: Internal Medicine

## 2014-09-06 MED ORDER — AMLODIPINE BESYLATE 10 MG PO TABS: 10.0000 mg | ORAL_TABLET | Freq: Every day | ORAL | Status: DC

## 2014-09-06 NOTE — Telephone Encounter (Signed)
Ok to incr the amlodipine to 10 qd  - new rx done

## 2014-09-06 NOTE — Telephone Encounter (Signed)
Frank [name] [phone] Stacy Gardner called in said that pt has been running a really high BP lately 182/82.  Requesting instruction for Dr

## 2014-09-06 NOTE — Telephone Encounter (Signed)
HHRN informed 

## 2014-09-11 ENCOUNTER — Encounter: Payer: Self-pay | Admitting: Physical Medicine & Rehabilitation

## 2014-09-11 ENCOUNTER — Encounter
Payer: BLUE CROSS/BLUE SHIELD | Attending: Physical Medicine & Rehabilitation | Admitting: Physical Medicine & Rehabilitation

## 2014-09-11 VITALS — BP 128/74 | HR 76 | Resp 14

## 2014-09-11 DIAGNOSIS — IMO0002 Reserved for concepts with insufficient information to code with codable children: Secondary | ICD-10-CM

## 2014-09-11 DIAGNOSIS — E1142 Type 2 diabetes mellitus with diabetic polyneuropathy: Secondary | ICD-10-CM

## 2014-09-11 DIAGNOSIS — Z5181 Encounter for therapeutic drug level monitoring: Secondary | ICD-10-CM | POA: Diagnosis not present

## 2014-09-11 DIAGNOSIS — G894 Chronic pain syndrome: Secondary | ICD-10-CM | POA: Diagnosis present

## 2014-09-11 DIAGNOSIS — E1165 Type 2 diabetes mellitus with hyperglycemia: Secondary | ICD-10-CM

## 2014-09-11 DIAGNOSIS — G610 Guillain-Barre syndrome: Secondary | ICD-10-CM

## 2014-09-11 DIAGNOSIS — F341 Dysthymic disorder: Secondary | ICD-10-CM

## 2014-09-11 DIAGNOSIS — Z79899 Other long term (current) drug therapy: Secondary | ICD-10-CM | POA: Diagnosis not present

## 2014-09-11 MED ORDER — DULOXETINE HCL 30 MG PO CPEP: 30.0000 mg | ORAL_CAPSULE | Freq: Two times a day (BID) | ORAL | Status: DC

## 2014-09-11 MED ORDER — METHOCARBAMOL 500 MG PO TABS: 500.0000 mg | ORAL_TABLET | Freq: Four times a day (QID) | ORAL | Status: DC | PRN

## 2014-09-11 MED ORDER — NONFORMULARY OR COMPOUNDED ITEM: 1.0000 "application " | Freq: Three times a day (TID) | Status: DC

## 2014-09-11 MED ORDER — ALLOPURINOL 100 MG PO TABS: 100.0000 mg | ORAL_TABLET | Freq: Every day | ORAL | Status: DC

## 2014-09-11 MED ORDER — OXYCODONE-ACETAMINOPHEN 7.5-325 MG PO TABS: 1.0000 | ORAL_TABLET | Freq: Four times a day (QID) | ORAL | Status: DC | PRN

## 2014-09-11 MED ORDER — PANTOPRAZOLE SODIUM 40 MG PO TBEC: 40.0000 mg | DELAYED_RELEASE_TABLET | Freq: Every day | ORAL | Status: DC

## 2014-09-11 MED ORDER — LORAZEPAM 0.5 MG PO TABS: 0.5000 mg | ORAL_TABLET | Freq: Two times a day (BID) | ORAL | Status: DC

## 2014-09-11 NOTE — Patient Instructions (Signed)
PLEASE CALL ME WITH ANY PROBLEMS OR QUESTIONS (#[phone]).      

## 2014-09-11 NOTE — Progress Notes (Signed)
Subjective:    Patient ID: Frank [name], male    DOB: 1956/04/07, 59 y.o.   MRN: [id number]  HPI  Frank [name] is back regarding his GBS. He had a relapse requiring re-hospitalization and IVIG.  He has been home a month. He's receiving therapies at home via [facility].   From a pain standpoint, he still has his leg and finger pain which is returning to the level prior to the GBS. He is also complaining of right hip pain since he started therapy.   His bowels and bladder are functioning appropriately.   Functionally, he is using a RW for ambulation. He was able to dress himself this morning. He is using a tub bench for the shower.   Pain Inventory Average Pain 7 Pain Right Now 7 My pain is constant, sharp, burning, dull, stabbing, tingling and aching  In the last 24 hours, has pain interfered with the following? General activity 7 Relation with others 2 Enjoyment of life 10 What TIME of day is your pain at its worst? night Sleep (in general) Poor  Pain is worse with: walking, bending, sitting, inactivity, standing and some activites Pain improves with: therapy/exercise and medication Relief from Meds: 8  Mobility walk with assistance use a walker how many minutes can you walk? 10 ability to climb steps?  no do you drive?  no  Function disabled: date disabled .  Neuro/Psych numbness tingling trouble walking spasms depression anxiety loss of taste or smell  Prior Studies hospital f/u  Physicians involved in your care hospital f/u   Family History  Problem Relation Age of Onset  . Diabetes Mother   . Heart disease Mother   . Hyperlipidemia Mother   . Depression Mother   . Diabetes Brother   . Colon cancer Neg Hx    History   Social History  . Marital Status: Single    Spouse Name: N/A    Number of Children: N/A  . 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 Smoking status: Never Smoker   .  Smokeless tobacco: Never Used  . Alcohol Use: Yes     Comment: occasional  . Drug Use: Yes    Special: Methaqualone  . Sexual Activity: Yes     Comment: before I got sick   Other Topics Concern  . None   Social History Narrative   Past Surgical History  Procedure Laterality Date  . Carpal tunnel release    . Rotator cuff repair    . Esophageal dilation  july 2009  . Esophagogastroduodenoscopy N/A 06/27/2014    Procedure: ESOPHAGOGASTRODUODENOSCOPY (EGD);  Surgeon: Lafayette Dragon, MD;  Location: [facility];  Service: Endoscopy;  Laterality: N/A;  . Eye surgery      catract surgery on both eyes   Past Medical History  Diagnosis Date  . COLONIC POLYPS 02/01/2008  . DIABETES MELLITUS, TYPE II 05/20/2010  . HYPERLIPIDEMIA 10/09/2008  . ANXIETY DEPRESSION 02/01/2008  . ERECTILE DYSFUNCTION 10/09/2008  . ADD 10/09/2008  . SLEEP APNEA, OBSTRUCTIVE 02/01/2008  . MORTON'S NEUROMA 05/20/2010  . PERIPHERAL NEUROPATHY 05/20/2010  . Other specified forms of hearing loss 06/27/2009  . HYPERTENSION 10/09/2008  . HEMORRHOIDS 02/01/2008  . ALLERGIC RHINITIS 10/09/2008  . Stricture and stenosis of esophagus 02/02/2008  . GERD 02/01/2008  . HIATAL HERNIA 02/01/2008  . ERECTILE DYSFUNCTION, ORGANIC 05/20/2010  . WRIST PAIN, LEFT 12/05/2009  . FOOT PAIN, LEFT 05/20/2010  .  PERIPHERAL EDEMA 05/20/2010  . DYSPNEA 03/12/2010  . Abdominal pain, unspecified site 01/19/2009  . Abdominal pain, left lower quadrant 06/06/2010  . Type II or unspecified type diabetes mellitus without mention of complication, uncontrolled 11/14/2010  . Guillain Barr syndrome    BP 128/74 mmHg  Pulse 76  Resp 14  SpO2 98%  Opioid Risk Score:   Fall Risk Score: Moderate Fall Risk (6-13 points) (pt rec'd education during hospital stay) Review of Systems  Neurological: Positive for tremors and numbness.       Tingling spasms  Psychiatric/Behavioral: Positive for dysphoric mood. The patient is nervous/anxious.   All other systems  reviewed and are negative.      Objective:   Physical Exam  Constitutional: He is oriented to person, place, and time. He appears well-developed. No distress.  HENT: oral mucosa pink and moist. No breakdown or irrititation. No drainage. A few caries Head: Normocephalic and atraumatic.  Eyes: Conjunctivae and EOM are normal. Pupils are equal, round, and reactive to light.  Neck: No JVD present. No tracheal deviation present. No thyromegaly present.  Cardiovascular: Normal rate and regular rhythm.  Respiratory: No respiratory distress. He has no wheezes. He has no rales. He exhibits no tenderness.  GI: He exhibits no distension. There is no tenderness. There is no rebound and no guarding.  Musculoskeletal: He exhibits no edema or tenderness.  Lymphadenopathy:   He has no cervical adenopathy.  Neurological: He is alert and oriented to person, place, and time.   sensory loss distal greater than proximal in stocking glove distribution, in all 4 limbs, lower more affected than uppers--worst from just above the malleoli and distally.  Motor: 5/5 deltoid, bicep, tricep, hands. LE: 4+/5 hf, 4+ to 5/5 ke and 4+/5 ankles-- . He walks with improved gait. He tends to drift left and right but can correct himself. He had some difficulty changing directions. Skin: Skin is warm.  Psychiatric:  Anxious at times  Assessment/Plan: 1. Functional deficits secondary to Guillain Barre Syndrome (vs CIDP) in patient with chronic peipheral neuropathy.  -make a referral to [facility] for OT and PT when hh therapy is winding down. I asked his partner to call me when they were reaching that point  -he is not appropriate for driving 3. Chronic Pain Management:  -percocet 5/325 q8 prn ---refill today #90 -lyrica and cymbalta  -custom compounded cream--ketoprofen, ketamine, elavil---TID  -consider TCA orally  -will pursue a CSA with him once I'm sure I'll be his pain mgt  provider 4. Anxiety/depression/Mood: Continue Cymbalta for mood stabilization.  9. Peripheral neuropathy: ContinueCymbalta. 30mg  bid -see above  Follow up in one month. Thirty minutes of face to face patient care time were spent during this visit. All questions were encouraged and answered.

## 2014-09-18 ENCOUNTER — Telehealth: Payer: Self-pay | Admitting: *Deleted

## 2014-09-18 DIAGNOSIS — G610 Guillain-Barre syndrome: Secondary | ICD-10-CM

## 2014-09-18 NOTE — Telephone Encounter (Signed)
[name] calling on behalf of Frank [name] saying he has completed [facility] therapy in the home and is ready for outpt.  They would like for [place] to schedule this.

## 2014-09-18 NOTE — Telephone Encounter (Signed)
Orders written for neuro rehab PT and OT on [facility]

## 2014-09-19 NOTE — Telephone Encounter (Signed)
Notified orders placed

## 2014-10-03 ENCOUNTER — Telehealth: Payer: Self-pay | Admitting: *Deleted

## 2014-10-03 NOTE — Telephone Encounter (Signed)
[name] called on pt's behalf saying that [name] needs refills for his oxycodone-acetaminophen 7.5-325 and his methylphenidate. The pt was seen for a hospital f/u on 09/11/2014 and has yet to make his next f/u appt

## 2014-10-03 NOTE — Telephone Encounter (Signed)
Percocet filled on 2/1---not due until next week. He hasn't signed a CSA.  We didn't discuss refilling his methylphenidate.  Who filled this before?   He will need a visit for evaluation and to pick up meds---can see [name] if needed. He's not on my schedule next week

## 2014-10-04 NOTE — Telephone Encounter (Signed)
Left message for Mr Lacinda Axon to call back and schedule an appointment with [name] Friday or Monday to be seen and get refills.  [name] has no availabilities.  No prescriptions will be filled without appointment.  Mr Wheller will need to sign a CSA and UDS at that visit (please note in appointment information)

## 2014-10-09 ENCOUNTER — Encounter: Payer: Self-pay | Admitting: Registered Nurse

## 2014-10-09 ENCOUNTER — Encounter (HOSPITAL_BASED_OUTPATIENT_CLINIC_OR_DEPARTMENT_OTHER): Payer: BLUE CROSS/BLUE SHIELD | Admitting: Registered Nurse

## 2014-10-09 VITALS — BP 140/72 | HR 88 | Resp 14

## 2014-10-09 DIAGNOSIS — G894 Chronic pain syndrome: Secondary | ICD-10-CM

## 2014-10-09 DIAGNOSIS — Z5181 Encounter for therapeutic drug level monitoring: Secondary | ICD-10-CM

## 2014-10-09 DIAGNOSIS — Z79899 Other long term (current) drug therapy: Secondary | ICD-10-CM | POA: Diagnosis not present

## 2014-10-09 MED ORDER — OXYCODONE-ACETAMINOPHEN 7.5-325 MG PO TABS: 1.0000 | ORAL_TABLET | Freq: Four times a day (QID) | ORAL | Status: DC | PRN

## 2014-10-09 MED ORDER — PREGABALIN 200 MG PO CAPS: 200.0000 mg | ORAL_CAPSULE | Freq: Three times a day (TID) | ORAL | Status: DC

## 2014-10-09 MED ORDER — METHYLPHENIDATE HCL ER 36 MG PO TB24: 36.0000 mg | ORAL_TABLET | Freq: Every day | ORAL | Status: DC

## 2014-10-09 MED ORDER — METHOCARBAMOL 500 MG PO TABS: 500.0000 mg | ORAL_TABLET | Freq: Three times a day (TID) | ORAL | Status: DC | PRN

## 2014-10-09 NOTE — Progress Notes (Signed)
Subjective:    Patient ID: Frank [name], male    DOB: [birth year], 59 y.o.   MRN: [id number]  HPI: Frank [name] is a 59 year old male who returns for followup and medication refill. He says his pain is located in his bilateral hands and feet, lower extremities and right hip. He rates his pain 7. His current exercise regime is walking.Marland Kitchen He's asking for his concerta to be increased due to attention deficit, his dose was adjusted while he was hospitalized. I spoke with Dr. Naaman Plummer we will keep concerta at current dosage. He was encouraged to make an appointment with his psychiatrist to adjust concerta he verbalizes understanding. He has anasarca to lower extremities, will continue to assess. Medications reviewed will continue to monitor at this time.  Spent over an hour answering Frank [name] and his Partner questions. Pain Inventory Average Pain 7 Pain Right Now 7 My pain is constant, burning, tingling and aching  In the last 24 hours, has pain interfered with the following? General activity 5 Relation with others 5 Enjoyment of life 5 What TIME of day is your pain at its worst? night Sleep (in general) Fair  Pain is worse with: inactivity and some activites Pain improves with: rest and medication Relief from Meds: 5  Mobility walk without assistance use a cane how many minutes can you walk? 5 ability to climb steps?  yes do you drive?  no  Function disabled: date disabled .  Neuro/Psych weakness confusion depression anxiety  Prior Studies Any changes since last visit?  no  Physicians involved in your care Any changes since last visit?  no   Family History  Problem Relation Age of Onset  . Diabetes Mother   . Heart disease Mother   . Hyperlipidemia Mother   . Depression Mother   . Diabetes Brother   . Colon cancer Neg Hx    History   Social History  . Marital Status: Single    Spouse Name: N/A  . Number of Children: N/A  . 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 Smoking status: Never Smoker   . Smokeless tobacco: Never Used  . Alcohol Use: Yes     Comment: occasional  . Drug Use: Yes    Special: Methaqualone  . Sexual Activity: Yes     Comment: before I got sick   Other Topics Concern  . None   Social History Narrative   Past Surgical History  Procedure Laterality Date  . Carpal tunnel release    . Rotator cuff repair    . Esophageal dilation  july 2009  . Esophagogastroduodenoscopy N/A 06/27/2014    Procedure: ESOPHAGOGASTRODUODENOSCOPY (EGD);  Surgeon: Lafayette Dragon, MD;  Location: [facility];  Service: Endoscopy;  Laterality: N/A;  . Eye surgery      catract surgery on both eyes   Past Medical History  Diagnosis Date  . COLONIC POLYPS 02/01/2008  . DIABETES MELLITUS, TYPE II 05/20/2010  . HYPERLIPIDEMIA 10/09/2008  . ANXIETY DEPRESSION 02/01/2008  . ERECTILE DYSFUNCTION 10/09/2008  . ADD 10/09/2008  . SLEEP APNEA, OBSTRUCTIVE 02/01/2008  . MORTON'S NEUROMA 05/20/2010  . PERIPHERAL NEUROPATHY 05/20/2010  . Other specified forms of hearing loss 06/27/2009  . HYPERTENSION 10/09/2008  . HEMORRHOIDS 02/01/2008  . ALLERGIC RHINITIS 10/09/2008  . Stricture and stenosis of esophagus 02/02/2008  . GERD 02/01/2008  . HIATAL HERNIA 02/01/2008  . ERECTILE DYSFUNCTION, ORGANIC  05/20/2010  . WRIST PAIN, LEFT 12/05/2009  . FOOT PAIN, LEFT 05/20/2010  . PERIPHERAL EDEMA 05/20/2010  . DYSPNEA 03/12/2010  . Abdominal pain, unspecified site 01/19/2009  . Abdominal pain, left lower quadrant 06/06/2010  . Type II or unspecified type diabetes mellitus without mention of complication, uncontrolled 11/14/2010  . Guillain Barr syndrome    BP 140/72 mmHg  Pulse 88  Resp 14  SpO2 100%  Opioid Risk Score:   Fall Risk Score: Moderate Fall Risk (6-13 points)  Review of Systems  HENT: Negative.   Eyes: Negative.   Respiratory: Negative.   Cardiovascular: Positive for leg  swelling.       Ankels  Gastrointestinal: Negative.   Endocrine: Negative.   Genitourinary: Negative.   Musculoskeletal: Positive for myalgias.       Pain in legs, hands and feet  Skin: Negative.   Allergic/Immunologic: Negative.   Neurological: Positive for weakness.  Hematological: Negative.   Psychiatric/Behavioral: Positive for confusion and dysphoric mood. The patient is nervous/anxious.        Objective:   Physical Exam  Constitutional: He is oriented to person, place, and time. He appears well-developed and well-nourished.  HENT:  Head: Normocephalic and atraumatic.  Neck: Normal range of motion. Neck supple.  Cardiovascular: Normal rate and regular rhythm.   Pulmonary/Chest: Effort normal and breath sounds normal.  Musculoskeletal:  Normal Muscle Bulk and Muscle Testing Reveals: Upper Extremities: Full ROM and Muscle Strength 5/5 Lower Extremities: Full ROM and Muscle Strength 5/5 Lower Extremities Flexion Produces Pain in Posteriorly extremities. Arises from chair with ease Using straight cane for support.  Neurological: He is alert and oriented to person, place, and time.  Skin: Skin is warm and dry.  Psychiatric: He has a normal mood and affect.  Nursing note and vitals reviewed.         Assessment & Plan:  1. Functional deficits secondary to Guillain Barre Syndrome with chronic peipheral neuropathy. He hasn't Follwed up with Neuro Rehab: He says he will call this week. Having Financial Hardship. 2.  Chronic Pain Management:Refilled: Percocet 7.5/325 mg one tablet every 6 hours as needed for pain #100. Second script given to accommodate appointment..Continue Lyrica and Cymbalta. 3. Muscle Spasms: Continue Robaxin 4. Anxiety/depression/Mood: Continue Cymbalta 5.. Peripheral neuropathy: Continue Lyrica and Cymbalta.   60+ minutes of face to face patient care time was spent during this visit. All questions were encouraged and answered.  F/U with Dr.  Naaman Plummer

## 2014-10-23 ENCOUNTER — Ambulatory Visit: Payer: BLUE CROSS/BLUE SHIELD | Attending: Physical Medicine & Rehabilitation

## 2014-10-23 DIAGNOSIS — M25551 Pain in right hip: Secondary | ICD-10-CM | POA: Diagnosis not present

## 2014-10-23 DIAGNOSIS — R262 Difficulty in walking, not elsewhere classified: Secondary | ICD-10-CM | POA: Diagnosis not present

## 2014-10-23 DIAGNOSIS — R279 Unspecified lack of coordination: Secondary | ICD-10-CM | POA: Diagnosis not present

## 2014-10-23 DIAGNOSIS — Z7409 Other reduced mobility: Secondary | ICD-10-CM | POA: Insufficient documentation

## 2014-10-23 NOTE — Therapy (Signed)
[facility] [address] [place], [place], [address] Phone: [phone]   Fax:  [phone]  Physical Therapy Evaluation  Patient Details  Name: Frank [name] MRN: [id number] Date of Birth: Jun 18, 1956 Referring Provider:  Meredith Staggers, MD  Encounter Date: 10/23/2014      PT End of Session - 10/23/14 1323    Visit Number 1   Number of Visits 17   Date for PT Re-Evaluation 12/22/14   Authorization Type BCBS   PT Start Time 1232   PT Stop Time 1319   PT Time Calculation (min) 47 min      Past Medical History  Diagnosis Date  . COLONIC POLYPS 02/01/2008  . DIABETES MELLITUS, TYPE II 05/20/2010  . HYPERLIPIDEMIA 10/09/2008  . ANXIETY DEPRESSION 02/01/2008  . ERECTILE DYSFUNCTION 10/09/2008  . ADD 10/09/2008  . SLEEP APNEA, OBSTRUCTIVE 02/01/2008  . MORTON'S NEUROMA 05/20/2010  . PERIPHERAL NEUROPATHY 05/20/2010  . Other specified forms of hearing loss 06/27/2009  . HYPERTENSION 10/09/2008  . HEMORRHOIDS 02/01/2008  . ALLERGIC RHINITIS 10/09/2008  . Stricture and stenosis of esophagus 02/02/2008  . GERD 02/01/2008  . HIATAL HERNIA 02/01/2008  . ERECTILE DYSFUNCTION, ORGANIC 05/20/2010  . WRIST PAIN, LEFT 12/05/2009  . FOOT PAIN, LEFT 05/20/2010  . PERIPHERAL EDEMA 05/20/2010  . DYSPNEA 03/12/2010  . Abdominal pain, unspecified site 01/19/2009  . Abdominal pain, left lower quadrant 06/06/2010  . Type II or unspecified type diabetes mellitus without mention of complication, uncontrolled 11/14/2010  . Guillain Barr syndrome     Past Surgical History  Procedure Laterality Date  . Carpal tunnel release    . Rotator cuff repair    . Esophageal dilation  july 2009  . Esophagogastroduodenoscopy N/A 06/27/2014    Procedure: ESOPHAGOGASTRODUODENOSCOPY (EGD);  Surgeon: Lafayette Dragon, MD;  Location: [facility];  Service: Endoscopy;  Laterality: N/A;  . Eye surgery      catract surgery on both eyes    There were no vitals  filed for this visit.  Visit Diagnosis:  Lack of coordination  Right hip pain  Difficulty walking  Decreased functional mobility and endurance      Subjective Assessment - 10/23/14 1242    Symptoms Pt had severe Guillain Barre episode last fall, was hospitalized then d/c home then had a relapse and was readmitted in November and had IVIg treatment and discharged 08/09/14. He has made steady progress with home health care and is now able to ambulate with a cane for short household distances.    Pertinent History R Ankle fracture July 2015 with hx of gout and a blood clot in the R ankle. Diabetes   Patient Stated Goals Pt reports he has a lot of pain in the legs and feet and medication isn't improving this. He would like to decrease pain. Improve walking endurance and ability.   Currently in Pain? Yes   Pain Score 6   feels like a heavy load from knee to foot; also reports hip pain with ambulation   Pain Location Knee  radiates to toes   Aggravating Factors  worse at night when pt is sleeping.            [facility] PT Assessment - 10/23/14 0001    Assessment   Medical Diagnosis guillain barre   Onset Date 06/24/14   Prior Therapy home health PT discharged in Feb 2016   Precautions   Precautions Fall   Balance Screen   Has the patient fallen in  the past 6 months Yes  fell out of bed in [facility], fell out of wheelchair    How many times? 3   Has the patient had a decrease in activity level because of a fear of falling?  No   Is the patient reluctant to leave their home because of a fear of falling?  No   Home Environment   Living Enviornment Private residence   Living Arrangements Spouse/significant other   Available Help at Discharge Other (Comment)  pt's partner   Type of [place] to enter   Entrance Stairs-Number of Steps 3   Entrance Stairs-Rails Can reach both   [place] One level   [place] - single point;Walker - 2  wheels;Wheelchair - manual   Prior Function   Level of Independence Independent with basic ADLs;Independent with homemaking with ambulation;Independent with gait;Independent with transfers  Prior to ankle fracture and guillain barre   Vocation --  Temporary disabled   Leisure watching soap operas and eating bon bons   Cognition   Overall Cognitive Status --  patient and caregiver report he is at baseline   Observation/Other Assessments   Focus on Therapeutic Outcomes (FOTO)  Funcitonal Status 60   Other Surveys  Select   Activities of Balance Confidence Scale (ABC Scale)  68.1 %   Posture/Postural Control   Posture/Postural Control Postural limitations   Postural Limitations Rounded Shoulders;Forward head;Posterior pelvic tilt   ROM / Strength   AROM / PROM / Strength Strength  BLE 5/5 for quads, hams, glut max, dorsiflexors, plantarflex   Strength   Overall Strength Within functional limits for tasks performed   Flexibility   Soft Tissue Assessment /Muscle Length --  Tight gastrocs bilaterally   Bed Mobility   Bed Mobility --  indepenent with all per pt report   Transfers   Transfers Sit to Stand;Stand to Sit  independent;without UE support   Ambulation/Gait   Ambulation/Gait Yes   Ambulation/Gait Assistance 5: Supervision   Ambulation/Gait Assistance Details no device on indoor level surfaces, but uses a cane on uneven surfaces or outside   Assistive device None   Gait Pattern --  occasional foot drop, wavering path, unsteadiness present   Ambulation Surface Level;Indoor   Gait velocity 2.84 ft/sec   Stairs Yes   Stairs Assistance 6: Modified independent (Device/Increase time)   Stair Management Technique Two rails;Alternating pattern   Number of Stairs 4   Height of Stairs 6   6 Minute Walk- Baseline   6 Minute Walk- Baseline --  TBD   Standardized Balance Assessment   Standardized Balance Assessment Dynamic Gait Index   Dynamic Gait Index   Level Surface Mild  Impairment   Change in Gait Speed Mild Impairment   Gait with Horizontal Head Turns Mild Impairment   Gait with Vertical Head Turns Mild Impairment   Gait and Pivot Turn Mild Impairment   Step Over Obstacle Moderate Impairment   Step Around Obstacles Moderate Impairment   Steps Mild Impairment   Total Score 14   High Level Balance   High Level Balance Activites --  Single limb stance RLE 8sec; LLE 5 sec   High Level Balance Comments --  Stands feet together, looks over shoulder, stands eyes close                             PT Short Term Goals - 10/23/14 1331  PT SHORT TERM GOAL #1   Title Demonstrate correct performance of HEP.Target 11/17/14.   PT SHORT TERM GOAL #2   Title Increase score on DGI to 17/24 for improving dynamic balance. Target 11/17/14   PT SHORT TERM GOAL #3   Title Ambulate on outdoor uneven surfaces with supervision without assistive device x500' for improving independence with community ambulation. Target 11/17/14.   PT SHORT TERM GOAL #4   Title Complete 6 minute walk test and write appropriate STG and LTG.  Target 11/17/14.           PT Long Term Goals - 10/23/14 1335    PT LONG TERM GOAL #1   Title Verbalize understanding of community wellness opportunities for improved fitness post d/c. Target 12/22/14   PT LONG TERM GOAL #2   Title Increase DGI to 20/24 for improved dynamic balance and decreased fall risk. Target 12/22/14   PT LONG TERM GOAL #3   Title Ambulate without assistive device on outdoor uneven grass, pinestraw, gravel, up/down curbs and ramps independently x1000' for improved independence with community ambuatiln Target 12/22/14   PT LONG TERM GOAL #4   Title Increase FOTO ABC score to 80% for improved balance confidence. Target 12/22/14   PT LONG TERM GOAL #5   Title Pt will report decreased right hip pain with ambulation to 2/10. Target 12/22/14               Plan - 10/23/14 1325    Clinical Impression Statement Pt  had Guillain Barre dx in November and had 2 hospitalizations as a result. He now presents with good lower extremity strength upon muscle testing, but poor coordination with gait impairments noted, balance impairments noted as well as increased pain and decreased activity tolerance and endurance. He appears to have difficulty with cognition but pt and partner report baseline. Will continue to monitor. Pt will benefit from skilled PT services to decrease risk of falls, decrease pain and improve activity tolerance.   Pt will benefit from skilled therapeutic intervention in order to improve on the following deficits Abnormal gait;Decreased coordination;Postural dysfunction;Decreased endurance;Decreased activity tolerance;Decreased balance;Pain   Rehab Potential Good   PT Frequency 2x / week   PT Duration 8 weeks   PT Treatment/Interventions ADLs/Self Care Home Management;Therapeutic activities;Patient/family education;Therapeutic exercise;Gait training;Balance training;Manual techniques;Neuromuscular re-education;Stair training   PT Next Visit Plan 6 minute walk test and STG. Assess balance on compliant surfaces with eyes open and closed. HEP to include tandem walking, walking with head turns, high knee marching, heel walking and posibly corner balance.   Consulted and Agree with Plan of Care Patient;Family member/caregiver         Problem List Patient Active Problem List   Diagnosis Date Noted  . SOB (shortness of breath)   . Respiratory failure   . Recurrent GBS (Guillain-Barre syndrome) 08/01/2014  . C. difficile colitis 07/11/2014  . Guillain Barr syndrome 07/11/2014  . Acute respiratory failure   . Encounter for orogastric (OG) tube placement   . Acute respiratory acidosis   . History of ETT   . Pain   . Rapidly progressive weakness 06/30/2014  . Acute respiratory failure with hypoxia 06/30/2014  . Protein-calorie malnutrition, severe 06/30/2014  . Ascending paralysis   . Nausea with  vomiting   . Back pain, thoracic 06/21/2014  . Nausea and vomiting 06/21/2014  . General weakness 06/21/2014  . Gait disorder 06/21/2014  . Decreased frequency of bowel movements 06/21/2014  . Orthostasis 06/21/2014  . Chronic back  pain greater than 3 months duration 06/21/2014  . Dehydration 06/21/2014  . Generalized weakness 06/21/2014  . Skin lesion 05/17/2014  . Low back pain 05/17/2014  . Screen for STD (sexually transmitted disease) 05/17/2014  . Closed low lateral malleolus fracture 03/10/2014  . Acute gouty arthritis 01/12/2014  . Change in bowel habits 08/15/2013  . Hemorrhage of rectum and anus 08/15/2013  . Umbilical hernia 79/48/0165  . Rash 11/25/2010  . Foot pain 11/25/2010  . Preventative health care 11/22/2010  . Uncontrolled type II diabetes mellitus with polyneuropathy 05/20/2010  . MORTON'S NEUROMA 05/20/2010  . PERIPHERAL NEUROPATHY 05/20/2010  . ERECTILE DYSFUNCTION, ORGANIC 05/20/2010  . PERIPHERAL EDEMA 05/20/2010  . HYPERLIPIDEMIA 10/09/2008  . ADD 10/09/2008  . Essential hypertension 10/09/2008  . ALLERGIC RHINITIS 10/09/2008  . Stricture and stenosis of esophagus 02/02/2008  . COLONIC POLYPS 02/01/2008  . ANXIETY DEPRESSION 02/01/2008  . SLEEP APNEA, OBSTRUCTIVE 02/01/2008  . HEMORRHOIDS 02/01/2008  . GERD 02/01/2008  . HIATAL HERNIA 02/01/2008    Delrae Sawyers D 10/23/2014, 1:51 PM  [facility] [address] [place], [place], [address] Phone: [phone]   Fax:  [phone]

## 2014-10-25 NOTE — Progress Notes (Addendum)
Urine drug screen for this encounter is inconsistent. Very positive for tapentadol (Nucynta) and N[facility]SR shows it has never been prescribed. All prescribed medications (oxycodone and lorazepam) are present.  Per [name] Frank [name] is to be discharged.

## 2014-10-26 ENCOUNTER — Telehealth: Payer: Self-pay | Admitting: *Deleted

## 2014-10-26 NOTE — Telephone Encounter (Signed)
By your note on UDS report--"May give a 30 day wean". Does this mean give him final 30 day rx? or else you will need to give directions on wean down.  He was on 7.5/325 #100 1 q 6hr prn.  Also he was given ritalin.  Are we going to give an rx for that as well?

## 2014-10-27 NOTE — Telephone Encounter (Signed)
Left message for Frank [name] (or Frank [name]) to call us on Monday so that we may discuss his options about his discharge.. In looking further-- back at his last appt with [name], the notes say he was given 2 rx to get him through until the 11/27/14 appt with [name]  This will mean he has that last rx and another one will not need to be given.  He filled ritalin and percocet 10/09/14 per [name] and should have second scripts. He has an appt on 11/01/14 with [name] and I question whether [name] should be notified of discharge so that plans can be made for referral elsewhere.

## 2014-10-27 NOTE — Telephone Encounter (Signed)
Ask the patient which he would prefer--- a month rx or a taper. i would prefer a wean but figure he will not want that. I should have been clearer before. thx

## 2014-10-27 NOTE — Telephone Encounter (Signed)
Please contact Dr. Jenny Reichmann on Monday (I will include him on this exchange). I will talk to him if needed also.  thx

## 2014-10-30 ENCOUNTER — Ambulatory Visit: Payer: BLUE CROSS/BLUE SHIELD

## 2014-10-30 DIAGNOSIS — M25551 Pain in right hip: Secondary | ICD-10-CM

## 2014-10-30 DIAGNOSIS — R262 Difficulty in walking, not elsewhere classified: Secondary | ICD-10-CM

## 2014-10-30 DIAGNOSIS — Z7409 Other reduced mobility: Secondary | ICD-10-CM

## 2014-10-30 DIAGNOSIS — R279 Unspecified lack of coordination: Secondary | ICD-10-CM | POA: Diagnosis not present

## 2014-10-30 NOTE — Patient Instructions (Signed)
Walking Program:  Begin walking for exercise for 6-10  minutes, 1-2 times/day, 4-5 days/week.   Progress your walking program by adding 2 minutes to your routine each week, as tolerated. Be sure to wear good walking shoes, walk in a safe environment and only progress to your tolerance.        Feet Together (Compliant Surface) Head Motion - Eyes Closed   Stand in a corner with a chair in front of you and eyes closed on compliant surface: two stacked pillows with feet together. Close eyes and move head slowly, up and down 10x, then move your head side to side 10x, then diagonal each direction 10x. Perform daily. *Hold a fingertip on chair for support, and eventually work to decreasing the fingertip   Copyright  VHI. All rights reserved.    Clam Shell 45 Degrees   Lying on your side with a 1/4th forward roll with hips and knees bent 45. Lift knee 3". Be sure pelvis does not roll backward. Do not arch back. Do 3 sets of 10 reps, each leg, every other day.  http://ss.exer.us/75   Copyright  VHI. All rights reserved.    IT Band: Wall Lean With Crossed Leg   Stand with right hand on wall (or hold onto a chair). Cross right leg behind other leg. Stretch hip toward wall with other arm supporting trunk. Hold 30 seconds. Relax. Repeat 3 times each leg. Do 2 sets per  Day. (morning and night) Repeat on other side.  Copyright  VHI. All rights reserved.   Hip Flexor Stretch   Lying on back near edge of bed, bend one leg, foot flat. Hang other leg over edge, relaxed, bend knee until a gentle stretch is felt. Keep foot supported with a stack of books or the floor. Hold 30 seconds.  Repeat 3 times on each leg. Do 2 sessions per day (morning and night). Advanced Exercise: Bend knee back keeping thigh in contact with bed.  http://gt2.exer.us/347   Copyright  VHI. All rights reserved.  Gluteal Stretch--don't use a ball    Lie supine, foot flat on bed and, other ankle crossed over  knee. Push your knee away from your body until a gentle stretch is felt. Peform 3x30 seconds on each leg morning and night. Copyright  VHI. All rights reserved.

## 2014-10-30 NOTE — Telephone Encounter (Signed)
noted 

## 2014-10-30 NOTE — Therapy (Signed)
[facility] [address] [place], [place], [address] Phone: [phone]   Fax:  [phone]  Physical Therapy Treatment  Patient Details  Name: Frank [name] MRN: [id number] Date of Birth: [birth year] Referring Provider:  Biagio Borg, MD  Encounter Date: 10/30/2014      PT End of Session - 10/30/14 1011    Visit Number 2   Number of Visits 17   Date for PT Re-Evaluation 12/22/14   Authorization Type BCBS   PT Start Time 0802   PT Stop Time 0846   PT Time Calculation (min) 44 min      Past Medical History  Diagnosis Date  . COLONIC POLYPS 02/01/2008  . DIABETES MELLITUS, TYPE II 05/20/2010  . HYPERLIPIDEMIA 10/09/2008  . ANXIETY DEPRESSION 02/01/2008  . ERECTILE DYSFUNCTION 10/09/2008  . ADD 10/09/2008  . SLEEP APNEA, OBSTRUCTIVE 02/01/2008  . MORTON'S NEUROMA 05/20/2010  . PERIPHERAL NEUROPATHY 05/20/2010  . Other specified forms of hearing loss 06/27/2009  . HYPERTENSION 10/09/2008  . HEMORRHOIDS 02/01/2008  . ALLERGIC RHINITIS 10/09/2008  . Stricture and stenosis of esophagus 02/02/2008  . GERD 02/01/2008  . HIATAL HERNIA 02/01/2008  . ERECTILE DYSFUNCTION, ORGANIC 05/20/2010  . WRIST PAIN, LEFT 12/05/2009  . FOOT PAIN, LEFT 05/20/2010  . PERIPHERAL EDEMA 05/20/2010  . DYSPNEA 03/12/2010  . Abdominal pain, unspecified site 01/19/2009  . Abdominal pain, left lower quadrant 06/06/2010  . Type II or unspecified type diabetes mellitus without mention of complication, uncontrolled 11/14/2010  . Guillain Barr syndrome     Past Surgical History  Procedure Laterality Date  . Carpal tunnel release    . Rotator cuff repair    . Esophageal dilation  july 2009  . Esophagogastroduodenoscopy N/A 06/27/2014    Procedure: ESOPHAGOGASTRODUODENOSCOPY (EGD);  Surgeon: Lafayette Dragon, MD;  Location: [facility];  Service: Endoscopy;  Laterality: N/A;  . Eye surgery      catract surgery on both eyes    There were no vitals filed  for this visit.  Visit Diagnosis:  Right hip pain  Lack of coordination  Difficulty walking  Decreased functional mobility and endurance      Subjective Assessment - 10/30/14 0804    Symptoms Pt reports he had a nice relaxing weekend.    Currently in Pain? Yes   Pain Score 5    Pain Location Leg   Pain Orientation Right;Left  and feet     Neuro re-ed: Corner balance activities including static standing on airex, then standing feet together static on airex, then eyes closed with feet together and intermittent CGA, then added head turns. See Pt instructions for details of head turns.    Therex: -6 minute walk test 1084'. Pt had one episode of left foot drop but able to self correct.  -Additional exercises to address lateral hip pain. See pt instructions.                       PT Education - 10/30/14 1009    Education provided Yes   Education Details HEP: walking program, corner balance on foam with feet together and eyes closed, IT band stretching/strengthening; recommendation not to drive at this time due to delayed reaction time and that he must be  cleared by a physician for this   Person(s) Educated Patient  partner   Methods Explanation;Demonstration;Verbal cues   Comprehension Verbalized understanding;Returned demonstration          PT Short Term Goals -  10/30/14 1232    PT SHORT TERM GOAL #1   Title Demonstrate correct performance of HEP.Target 11/17/14.   PT SHORT TERM GOAL #2   Title Increase score on DGI to 17/24 for improving dynamic balance. Target 11/17/14   PT SHORT TERM GOAL #3   Title Ambulate on outdoor uneven surfaces with supervision without assistive device x500' for improving independence with community ambulation. Target 11/17/14.   PT SHORT TERM GOAL #4   Title Increase 6 minute walk test to 1159' for improved endurance, walking efficiency and ambulation tolerance.  Target 11/17/14.           PT Long Term Goals - 10/30/14 1234     PT LONG TERM GOAL #1   Title Verbalize understanding of community wellness opportunities for improved fitness post d/c. Target 12/22/14   PT LONG TERM GOAL #2   Title Increase DGI to 20/24 for improved dynamic balance and decreased fall risk. Target 12/22/14   PT LONG TERM GOAL #3   Title Ambulate without assistive device on outdoor uneven grass, pinestraw, gravel, up/down curbs and ramps independently x1000' for improved independence with community ambuatiln Target 12/22/14   PT LONG TERM GOAL #4   Title Increase FOTO ABC score to 80% for improved balance confidence. Target 12/22/14   PT LONG TERM GOAL #5   Title Pt will report decreased right hip pain with ambulation to 2/10. Target 12/22/14   Additional Long Term Goals   Additional Long Term Goals Yes   PT LONG TERM GOAL #6   Title Increase 6 minute walk test distance to 1234' for improved endurance and efficiency of ambulation. Target 12/22/14               Plan - 10/30/14 1011    Clinical Impression Statement Pt demonstrates decreased use of vestibular sytem for balance and exercises for this were included in HEP. Pt also provided with HEP to address possible IIT band pain. Will benefit form additional balance exercises next session.   PT Next Visit Plan ask how walking program is going, add tandem walking, walking with head turns, high knee marching, heel walking to HEP        Problem List Patient Active Problem List   Diagnosis Date Noted  . SOB (shortness of breath)   . Respiratory failure   . Recurrent GBS (Guillain-Barre syndrome) 08/01/2014  . C. difficile colitis 07/11/2014  . Guillain Barr syndrome 07/11/2014  . Acute respiratory failure   . Encounter for orogastric (OG) tube placement   . Acute respiratory acidosis   . History of ETT   . Pain   . Rapidly progressive weakness 06/30/2014  . Acute respiratory failure with hypoxia 06/30/2014  . Protein-calorie malnutrition, severe 06/30/2014  . Ascending  paralysis   . Nausea with vomiting   . Back pain, thoracic 06/21/2014  . Nausea and vomiting 06/21/2014  . General weakness 06/21/2014  . Gait disorder 06/21/2014  . Decreased frequency of bowel movements 06/21/2014  . Orthostasis 06/21/2014  . Chronic back pain greater than 3 months duration 06/21/2014  . Dehydration 06/21/2014  . Generalized weakness 06/21/2014  . Skin lesion 05/17/2014  . Low back pain 05/17/2014  . Screen for STD (sexually transmitted disease) 05/17/2014  . Closed low lateral malleolus fracture 03/10/2014  . Acute gouty arthritis 01/12/2014  . Change in bowel habits 08/15/2013  . Hemorrhage of rectum and anus 08/15/2013  . Umbilical hernia 29/51/8841  . Rash 11/25/2010  . Foot pain 11/25/2010  .  Preventative health care 11/22/2010  . Uncontrolled type II diabetes mellitus with polyneuropathy 05/20/2010  . MORTON'S NEUROMA 05/20/2010  . PERIPHERAL NEUROPATHY 05/20/2010  . ERECTILE DYSFUNCTION, ORGANIC 05/20/2010  . PERIPHERAL EDEMA 05/20/2010  . HYPERLIPIDEMIA 10/09/2008  . ADD 10/09/2008  . Essential hypertension 10/09/2008  . ALLERGIC RHINITIS 10/09/2008  . Stricture and stenosis of esophagus 02/02/2008  . COLONIC POLYPS 02/01/2008  . ANXIETY DEPRESSION 02/01/2008  . SLEEP APNEA, OBSTRUCTIVE 02/01/2008  . HEMORRHOIDS 02/01/2008  . GERD 02/01/2008  . HIATAL HERNIA 02/01/2008   Delrae Sawyers, PT,DPT,NCS 10/30/2014 12:36 PM Phone [phone] FAX [phone]         [facility] [address] [place], [place], [address] Phone: [phone]   Fax:  [phone]

## 2014-10-30 NOTE — Telephone Encounter (Addendum)
[name] please review conversation attached to this message from [name]  Mr Ransford has an appt to see you on 11/01/14.  He is being dismissed from [name] service for having unprescribed We  notified him by phone and a letter has been mailed.  By our NP Josph Macho note, he was given 2 prescriptions on 10/09/14 for his narcotics and should have one to fill ~11/06/14.

## 2014-10-30 NOTE — Telephone Encounter (Signed)
I will forward this conversation to [name]. What is so ironic about the UDS being largely positive for Nucynta is that I noticed on his allergy list that it is listed as causing somnolence.

## 2014-11-01 ENCOUNTER — Encounter: Payer: Self-pay | Admitting: Internal Medicine

## 2014-11-01 ENCOUNTER — Ambulatory Visit (INDEPENDENT_AMBULATORY_CARE_PROVIDER_SITE_OTHER): Payer: BLUE CROSS/BLUE SHIELD | Admitting: Internal Medicine

## 2014-11-01 VITALS — BP 142/90 | HR 103 | Temp 98.2°F | Resp 18 | Ht 69.0 in | Wt 211.0 lb

## 2014-11-01 DIAGNOSIS — F909 Attention-deficit hyperactivity disorder, unspecified type: Secondary | ICD-10-CM

## 2014-11-01 DIAGNOSIS — G894 Chronic pain syndrome: Secondary | ICD-10-CM | POA: Diagnosis not present

## 2014-11-01 DIAGNOSIS — F988 Other specified behavioral and emotional disorders with onset usually occurring in childhood and adolescence: Secondary | ICD-10-CM

## 2014-11-01 DIAGNOSIS — F341 Dysthymic disorder: Secondary | ICD-10-CM | POA: Diagnosis not present

## 2014-11-01 DIAGNOSIS — M109 Gout, unspecified: Secondary | ICD-10-CM

## 2014-11-01 MED ORDER — METHYLPHENIDATE HCL ER 36 MG PO TB24: 36.0000 mg | ORAL_TABLET | Freq: Every day | ORAL | Status: DC

## 2014-11-01 MED ORDER — PREDNISONE 10 MG PO TABS: ORAL_TABLET | ORAL | Status: DC

## 2014-11-01 MED ORDER — ALLOPURINOL 300 MG PO TABS: 300.0000 mg | ORAL_TABLET | Freq: Every day | ORAL | Status: DC

## 2014-11-01 MED ORDER — FAMOTIDINE 40 MG PO TABS: 40.0000 mg | ORAL_TABLET | Freq: Two times a day (BID) | ORAL | Status: DC

## 2014-11-01 MED ORDER — METHYLPREDNISOLONE ACETATE 80 MG/ML IJ SUSP
80.0000 mg | Freq: Once | INTRAMUSCULAR | Status: AC
  Administered 2014-11-01: 80 mg via INTRAMUSCULAR

## 2014-11-01 MED ORDER — AMLODIPINE BESYLATE 10 MG PO TABS: 10.0000 mg | ORAL_TABLET | Freq: Every day | ORAL | Status: DC

## 2014-11-01 MED ORDER — PANTOPRAZOLE SODIUM 40 MG PO TBEC: 40.0000 mg | DELAYED_RELEASE_TABLET | Freq: Every day | ORAL | Status: DC

## 2014-11-01 MED ORDER — METHOCARBAMOL 500 MG PO TABS: 500.0000 mg | ORAL_TABLET | Freq: Three times a day (TID) | ORAL | Status: DC | PRN

## 2014-11-01 NOTE — Patient Instructions (Addendum)
You had the steroid shot today  Please take all new medication as prescribed - the prednisone  OK to increase the allopurinol to 300 mg per day (sent to your pharmacy)  Please continue all other medications as before, and refills have been done if requested. - the concerta  Please have the pharmacy call with any other refills you may need  Please keep your appointments with your specialists as you may have planned  You will be contacted regarding the referral for: Pain clinic  Please return in 6 months, or sooner if needed

## 2014-11-01 NOTE — Assessment & Plan Note (Signed)
Due next rx at mar 28 - for refill today, o/w stable

## 2014-11-01 NOTE — Assessment & Plan Note (Signed)
Mild to mod, for depomedrol IM, prednisone asd, increased allopurinol to 300 qd,  to f/u any worsening symptoms or concerns

## 2014-11-01 NOTE — Progress Notes (Signed)
Subjective:    Patient ID: Frank [name], male    DOB: 01/16/1956, 59 y.o.   MRN: [id number]  HPI  Here to f/u; overall doing ok,  Pt denies chest pain, increasing sob or doe, wheezing, orthopnea, PND,  palpitations, dizziness or syncope.  Pt denies new neurological symptoms such as new headache, or facial or extremity weakness or numbness.  Pt denies polydipsia, polyuria, or low sugar episode.   Pt denies new neurological symptoms such as new headache, or facial or extremity weakness or numbness.   Pt states overall good compliance with meds, mostly trying to follow appropriate diet, with wt overall stable,  but little exercise however.  Has several wks bilat ankle pain and swelling, similar to prior gout episode.  Takes the allopurinol 100 mg.  Pt states ran out of pain med (though different from documented refills), took nucynta, and dismissed from pain clinic. Needs new referral.  Past Medical History  Diagnosis Date  . COLONIC POLYPS 02/01/2008  . DIABETES MELLITUS, TYPE II 05/20/2010  . HYPERLIPIDEMIA 10/09/2008  . ANXIETY DEPRESSION 02/01/2008  . ERECTILE DYSFUNCTION 10/09/2008  . ADD 10/09/2008  . SLEEP APNEA, OBSTRUCTIVE 02/01/2008  . MORTON'S NEUROMA 05/20/2010  . PERIPHERAL NEUROPATHY 05/20/2010  . Other specified forms of hearing loss 06/27/2009  . HYPERTENSION 10/09/2008  . HEMORRHOIDS 02/01/2008  . ALLERGIC RHINITIS 10/09/2008  . Stricture and stenosis of esophagus 02/02/2008  . GERD 02/01/2008  . HIATAL HERNIA 02/01/2008  . ERECTILE DYSFUNCTION, ORGANIC 05/20/2010  . WRIST PAIN, LEFT 12/05/2009  . FOOT PAIN, LEFT 05/20/2010  . PERIPHERAL EDEMA 05/20/2010  . DYSPNEA 03/12/2010  . Abdominal pain, unspecified site 01/19/2009  . Abdominal pain, left lower quadrant 06/06/2010  . Type II or unspecified type diabetes mellitus without mention of complication, uncontrolled 11/14/2010  . Guillain Barr syndrome    Past Surgical History  Procedure Laterality Date  . Carpal tunnel release      . Rotator cuff repair    . Esophageal dilation  july 2009  . Esophagogastroduodenoscopy N/A 06/27/2014    Procedure: ESOPHAGOGASTRODUODENOSCOPY (EGD);  Surgeon: Lafayette Dragon, MD;  Location: [facility];  Service: Endoscopy;  Laterality: N/A;  . Eye surgery      catract surgery on both eyes    reports that he has never smoked. He has never used smokeless tobacco. He reports that he drinks alcohol. He reports that he uses illicit drugs (Methaqualone). family history includes Depression in his mother; Diabetes in his brother and mother; Heart disease in his mother; Hyperlipidemia in his mother. There is no history of Colon cancer. Allergies  Allergen Reactions  . Codeine Itching    REACTION: Itching  . Metformin And Related Other (See Comments)    GI upset, diarrhea  . Nucynta [Tapentadol]     somnolence  . Shellfish Allergy    Current Outpatient Prescriptions on File Prior to Visit  Medication Sig Dispense Refill  . aspirin EC 81 MG tablet Take 1 tablet (81 mg total) by mouth daily.    . diphenhydramine-acetaminophen (TYLENOL PM) 25-500 MG TABS Take 1 tablet by mouth at bedtime as needed (headache).    . DULoxetine (CYMBALTA) 30 MG capsule Take 1 capsule (30 mg total) by mouth 2 (two) times daily. 60 capsule 3  . glipiZIDE (GLUCOTROL) 10 MG tablet Take 10 mg by mouth daily before breakfast.    . glucose blood (FREESTYLE TEST STRIPS) test strip Use as instructed 100 each 12  . hydrochlorothiazide (MICROZIDE) 12.5 MG capsule  Take 1 capsule (12.5 mg total) by mouth daily. 90 capsule 3  . LORazepam (ATIVAN) 0.5 MG tablet Take 1 tablet (0.5 mg total) by mouth 2 (two) times daily. 60 tablet 2  . methylphenidate 36 MG PO CR tablet Take 1 tablet (36 mg total) by mouth daily. 30 tablet 0  . NONFORMULARY OR COMPOUNDED ITEM Apply 1 application topically 3 (three) times daily. 20% ketoprofen, 4%ketamine, 5% amitriptyline  60 gm supply 1 each 4  . oxyCODONE-acetaminophen (PERCOCET) 7.5-325 MG  per tablet Take 1 tablet by mouth every 6 (six) hours as needed for pain. 100 tablet 0  . polyethylene glycol (MIRALAX / GLYCOLAX) packet Take 17 g by mouth daily. 30 each 0  . pregabalin (LYRICA) 200 MG capsule Take 1 capsule (200 mg total) by mouth 3 (three) times daily. 90 capsule 1   No current facility-administered medications on file prior to visit.   Review of Systems  Constitutional: Negative for unusual diaphoresis or night sweats HENT: Negative for ringing in ear or discharge Eyes: Negative for double vision or worsening visual disturbance.  Respiratory: Negative for choking and stridor.   Gastrointestinal: Negative for vomiting or other signifcant bowel change Genitourinary: Negative for hematuria or change in urine volume.  Musculoskeletal: Negative for other MSK pain or swelling Skin: Negative for color change and worsening wound.  Neurological: Negative for tremors and numbness other than noted  Psychiatric/Behavioral: Negative for decreased concentration or agitation other than above       Objective:   Physical Exam BP 142/90 mmHg  Pulse 103  Temp(Src) 98.2 F (36.8 C) (Oral)  Resp 18  Ht 5\' 9"  (1.753 m)  Wt 211 lb (95.709 kg)  BMI 31.15 kg/m2  SpO2 97% VS noted,  Constitutional: Pt appears in no significant distress HENT: Head: NCAT.  Right Ear: External ear normal.  Left Ear: External ear normal.  Eyes: . Pupils are equal, round, and reactive to light. Conjunctivae and EOM are normal Neck: Normal range of motion. Neck supple.  Cardiovascular: Normal rate and regular rhythm.   Pulmonary/Chest: Effort normal and breath sounds without rales or wheezing.  Abd:  Soft, NT, ND, + BS Neurological: Pt is alert. Not confused , motor grossly intact Skin: Skin is warm. No rash, no LE edema Psychiatric: Pt behavior is normal. No agitation.  Bilat ankle 2-3+ tender/sweling with bilat leg edema left > right to 6 cm below knees    Assessment & Plan:

## 2014-11-01 NOTE — Assessment & Plan Note (Signed)
Still has one refill on lorazepam - to cont as already rx

## 2014-11-01 NOTE — Assessment & Plan Note (Signed)
Has rx for pain med now, for refer new pain management

## 2014-11-02 ENCOUNTER — Ambulatory Visit: Payer: BLUE CROSS/BLUE SHIELD

## 2014-11-02 DIAGNOSIS — Z7409 Other reduced mobility: Secondary | ICD-10-CM

## 2014-11-02 DIAGNOSIS — R262 Difficulty in walking, not elsewhere classified: Secondary | ICD-10-CM

## 2014-11-02 DIAGNOSIS — R279 Unspecified lack of coordination: Secondary | ICD-10-CM

## 2014-11-02 NOTE — Patient Instructions (Signed)
"  I love a Parade" Lift   Using a chair to hold on, and have a chair positioned behind you in case your knees buckle, march in place bringing your knees as high as you can. Repeat 20 times. Do 3 sessions per day.  http://gt2.exer.us/344   Copyright  VHI. All rights reserved.    Walking Head Turn   Hold onto the counter, walk while turning head side to side. Perform forwards only. 3 laps at counter.  http://gt2.exer.us/535   Copyright  VHI. All rights reserved.     Tandem Walking   Walk with each foot directly in front of other, heel of one foot touching toes of other foot with each step. Both feet straight ahead. Hold onto counter and perform forwards and backwards 3 laps.   Copyright  VHI. All rights reserved.    FUNCTIONAL MOBILITY: Heel Walking   Hold counter as needed. Walk forward on heels with eyes closed; walk backwards on heels with eyes open. Perform 3 laps daily. Copyright  VHI. All rights reserved.

## 2014-11-02 NOTE — Therapy (Signed)
[facility] [address] [place], [place], [address] Phone: [phone]   Fax:  [phone]  Physical Therapy Treatment  Patient Details  Name: Frank [name] MRN: [id number] Date of Birth: 1956-05-24 Referring Provider:  Biagio Borg, MD  Encounter Date: 11/02/2014      PT End of Session - 11/02/14 0932    Visit Number 3   Number of Visits 17   Date for PT Re-Evaluation 12/22/14   Authorization Type [employer]   PT Start Time 0848   PT Stop Time 0932   PT Time Calculation (min) 44 min      Past Medical History  Diagnosis Date  . COLONIC POLYPS 02/01/2008  . DIABETES MELLITUS, TYPE II 05/20/2010  . HYPERLIPIDEMIA 10/09/2008  . ANXIETY DEPRESSION 02/01/2008  . ERECTILE DYSFUNCTION 10/09/2008  . ADD 10/09/2008  . SLEEP APNEA, OBSTRUCTIVE 02/01/2008  . MORTON'S NEUROMA 05/20/2010  . PERIPHERAL NEUROPATHY 05/20/2010  . Other specified forms of hearing loss 06/27/2009  . HYPERTENSION 10/09/2008  . HEMORRHOIDS 02/01/2008  . ALLERGIC RHINITIS 10/09/2008  . Stricture and stenosis of esophagus 02/02/2008  . GERD 02/01/2008  . HIATAL HERNIA 02/01/2008  . ERECTILE DYSFUNCTION, ORGANIC 05/20/2010  . WRIST PAIN, LEFT 12/05/2009  . FOOT PAIN, LEFT 05/20/2010  . PERIPHERAL EDEMA 05/20/2010  . DYSPNEA 03/12/2010  . Abdominal pain, unspecified site 01/19/2009  . Abdominal pain, left lower quadrant 06/06/2010  . Type II or unspecified type diabetes mellitus without mention of complication, uncontrolled 11/14/2010  . Guillain Barr syndrome     Past Surgical History  Procedure Laterality Date  . Carpal tunnel release    . Rotator cuff repair    . Esophageal dilation  july 2009  . Esophagogastroduodenoscopy N/A 06/27/2014    Procedure: ESOPHAGOGASTRODUODENOSCOPY (EGD);  Surgeon: Lafayette Dragon, MD;  Location: [facility];  Service: Endoscopy;  Laterality: N/A;  . Eye surgery      catract surgery on both eyes    There were no vitals  filed for this visit.  Visit Diagnosis:  Lack of coordination  Difficulty walking  Decreased functional mobility and endurance      Subjective Assessment - 11/02/14 0851    Symptoms Pt reports he had a steriod shot in the feet and legs this week and the pain is improving. Also reports he has been walking up and down the stairs at home.   Currently in Pain? Yes   Pain Score 5    Pain Location Hip  feet and legs   Pain Orientation Right     High knee marching--Two laps at counter forward and backward. left knee noted to buckle when performing this. Pt instructed to perform in place with a chair behind him at home. Walking with head turns-in hallway 3 laps forwardl eyes open, 3 laps backward eyes open, then 3 laps forward and backward with eyes closed. Tandem walking- at counter, holding counter 3 laps forward and backward with eyes open then eyes closed. Heel walking-Holding counter 3 laps forward and backward with eyes open and closed. MIN A to steady with eyes closed.   Observed pt negotiating stairs with two hand rails, step over step, with safe performance  Therex (5 minutes) -Supine right and left leg straight leg raises x10, straigh leg with hip internal rotation x10, straight leg with external rotation x10  PT Short Term Goals - 10/30/14 1232    PT SHORT TERM GOAL #1   Title Demonstrate correct performance of HEP.Target 11/17/14.   PT SHORT TERM GOAL #2   Title Increase score on DGI to 17/24 for improving dynamic balance. Target 11/17/14   PT SHORT TERM GOAL #3   Title Ambulate on outdoor uneven surfaces with supervision without assistive device x500' for improving independence with community ambulation. Target 11/17/14.   PT SHORT TERM GOAL #4   Title Increase 6 minute walk test to 1159' for improved endurance, walking efficiency and ambulation tolerance.  Target 11/17/14.           PT Long Term Goals - 10/30/14 1234    PT  LONG TERM GOAL #1   Title Verbalize understanding of community wellness opportunities for improved fitness post d/c. Target 12/22/14   PT LONG TERM GOAL #2   Title Increase DGI to 20/24 for improved dynamic balance and decreased fall risk. Target 12/22/14   PT LONG TERM GOAL #3   Title Ambulate without assistive device on outdoor uneven grass, pinestraw, gravel, up/down curbs and ramps independently x1000' for improved independence with community ambuatiln Target 12/22/14   PT LONG TERM GOAL #4   Title Increase FOTO ABC score to 80% for improved balance confidence. Target 12/22/14   PT LONG TERM GOAL #5   Title Pt will report decreased right hip pain with ambulation to 2/10. Target 12/22/14   Additional Long Term Goals   Additional Long Term Goals Yes   PT LONG TERM GOAL #6   Title Increase 6 minute walk test distance to 1234' for improved endurance and efficiency of ambulation. Target 12/22/14               Plan - 11/02/14 1308    Clinical Impression Statement Pt had 1 episode of LLE foot drop and 1 episode of L knee buckling during session today. He is demonstrating improved activity tolerance. Continue per plan of care.   PT Next Visit Plan Continue with balance training, also lower extremity strengthening.        Problem List Patient Active Problem List   Diagnosis Date Noted  . Chronic pain syndrome 11/01/2014  . SOB (shortness of breath)   . Respiratory failure   . Recurrent GBS (Guillain-Barre syndrome) 08/01/2014  . C. difficile colitis 07/11/2014  . Guillain Barr syndrome 07/11/2014  . Acute respiratory failure   . Encounter for orogastric (OG) tube placement   . Acute respiratory acidosis   . History of ETT   . Pain   . Rapidly progressive weakness 06/30/2014  . Acute respiratory failure with hypoxia 06/30/2014  . Protein-calorie malnutrition, severe 06/30/2014  . Ascending paralysis   . Nausea with vomiting   . Back pain, thoracic 06/21/2014  . Nausea and  vomiting 06/21/2014  . General weakness 06/21/2014  . Gait disorder 06/21/2014  . Decreased frequency of bowel movements 06/21/2014  . Orthostasis 06/21/2014  . Chronic back pain greater than 3 months duration 06/21/2014  . Dehydration 06/21/2014  . Generalized weakness 06/21/2014  . Skin lesion 05/17/2014  . Low back pain 05/17/2014  . Screen for STD (sexually transmitted disease) 05/17/2014  . Closed low lateral malleolus fracture 03/10/2014  . Acute gouty arthritis 01/12/2014  . Change in bowel habits 08/15/2013  . Hemorrhage of rectum and anus 08/15/2013  . Umbilical hernia 26/71/2458  . Rash 11/25/2010  . Foot pain 11/25/2010  . Preventative health care 11/22/2010  . Uncontrolled type  II diabetes mellitus with polyneuropathy 05/20/2010  . MORTON'S NEUROMA 05/20/2010  . PERIPHERAL NEUROPATHY 05/20/2010  . ERECTILE DYSFUNCTION, ORGANIC 05/20/2010  . PERIPHERAL EDEMA 05/20/2010  . HYPERLIPIDEMIA 10/09/2008  . Attention deficit disorder 10/09/2008  . Essential hypertension 10/09/2008  . ALLERGIC RHINITIS 10/09/2008  . Stricture and stenosis of esophagus 02/02/2008  . COLONIC POLYPS 02/01/2008  . ANXIETY DEPRESSION 02/01/2008  . SLEEP APNEA, OBSTRUCTIVE 02/01/2008  . HEMORRHOIDS 02/01/2008  . GERD 02/01/2008  . HIATAL HERNIA 02/01/2008    Delrae Sawyers D 11/02/2014, 1:11 PM  [facility] [address] [place], [place], [address] Phone: [phone]   Fax:  [phone]

## 2014-11-06 ENCOUNTER — Ambulatory Visit: Payer: BLUE CROSS/BLUE SHIELD

## 2014-11-08 ENCOUNTER — Ambulatory Visit: Payer: BLUE CROSS/BLUE SHIELD

## 2014-11-09 ENCOUNTER — Other Ambulatory Visit: Payer: Self-pay | Admitting: *Deleted

## 2014-11-09 NOTE — Telephone Encounter (Signed)
Received fax pt needing PA on his pantoprazole. Called insurance spoke with rep faxed over PA form has been completed waiting on approval status...Frank [name]

## 2014-11-10 ENCOUNTER — Ambulatory Visit: Payer: BLUE CROSS/BLUE SHIELD | Admitting: Physical Medicine & Rehabilitation

## 2014-11-10 ENCOUNTER — Ambulatory Visit: Payer: Self-pay | Admitting: Neurology

## 2014-11-13 ENCOUNTER — Other Ambulatory Visit: Payer: Self-pay | Admitting: *Deleted

## 2014-11-13 ENCOUNTER — Ambulatory Visit: Payer: BLUE CROSS/BLUE SHIELD

## 2014-11-13 NOTE — Telephone Encounter (Signed)
Received PA med has been approved. Notified pharmacy with approval status...Frank [name]

## 2014-11-13 NOTE — Telephone Encounter (Signed)
Received fax pt needing PA on his pantoprazole. PA was completed on Friday and fax back. Received approval this am notified [facility] gave approval.../lmb

## 2014-11-15 ENCOUNTER — Ambulatory Visit (INDEPENDENT_AMBULATORY_CARE_PROVIDER_SITE_OTHER): Payer: BC Managed Care – PPO | Admitting: Neurology

## 2014-11-15 ENCOUNTER — Encounter: Payer: Self-pay | Admitting: Neurology

## 2014-11-15 ENCOUNTER — Ambulatory Visit: Payer: BLUE CROSS/BLUE SHIELD

## 2014-11-15 VITALS — BP 154/68 | HR 82 | Resp 14 | Ht 69.0 in | Wt 218.0 lb

## 2014-11-15 DIAGNOSIS — F988 Other specified behavioral and emotional disorders with onset usually occurring in childhood and adolescence: Secondary | ICD-10-CM

## 2014-11-15 DIAGNOSIS — G5602 Carpal tunnel syndrome, left upper limb: Secondary | ICD-10-CM | POA: Diagnosis not present

## 2014-11-15 DIAGNOSIS — F909 Attention-deficit hyperactivity disorder, unspecified type: Secondary | ICD-10-CM | POA: Diagnosis not present

## 2014-11-15 DIAGNOSIS — G610 Guillain-Barre syndrome: Secondary | ICD-10-CM

## 2014-11-15 DIAGNOSIS — G5600 Carpal tunnel syndrome, unspecified upper limb: Secondary | ICD-10-CM | POA: Insufficient documentation

## 2014-11-15 DIAGNOSIS — G894 Chronic pain syndrome: Secondary | ICD-10-CM

## 2014-11-15 DIAGNOSIS — E1142 Type 2 diabetes mellitus with diabetic polyneuropathy: Secondary | ICD-10-CM | POA: Diagnosis not present

## 2014-11-15 DIAGNOSIS — R208 Other disturbances of skin sensation: Secondary | ICD-10-CM | POA: Diagnosis not present

## 2014-11-15 MED ORDER — DULOXETINE HCL 30 MG PO CPEP: 30.0000 mg | ORAL_CAPSULE | Freq: Three times a day (TID) | ORAL | Status: DC

## 2014-11-15 MED ORDER — TAPENTADOL HCL 75 MG PO TABS: 1.0000 | ORAL_TABLET | Freq: Three times a day (TID) | ORAL | Status: DC | PRN

## 2014-11-15 MED ORDER — PREGABALIN 200 MG PO CAPS
200.0000 mg | ORAL_CAPSULE | Freq: Two times a day (BID) | ORAL | Status: DC
Start: 2014-11-15 — End: 2015-04-17

## 2014-11-15 NOTE — Progress Notes (Signed)
[facility]  PATIENT: Frank [name] DOB: 03-Mar-1956  REFERRING DOCTOR OR PCP:  Cathlean Cower, MD SOURCE: patient and ER/Hospital records and MRI imagesp  _________________________________   HISTORICAL  CHIEF COMPLAINT:  Chief Complaint  Patient presents with  . Peripheral Neuropathy    Sts. he developed GBS after flu shot last fall, and was hospitalized at [facility] in Nov-Dec 2015.  Sts. neuropathic pain in legs has been wrose since then.  He also sts. pain in left hand is worse and requests an inj. if appropriate.  He needs r/f of Cymbalta, Lyrica and Nucynta/fim  . GBS    HISTORY OF PRESENT ILLNESS:  Frank [name] is a 59 year old man who had Guillain Barr syndrome in November 2015. He then had a recurrence one month later was readmitted. Currently he is reporting increased neuropathic pain in his legs.  The initial episode of GBS was treated with plasmapheresis. He reports that the diagnosis took a couple weeks and he had several emergency room visits. Initially he was sent to a skilled nursing facility. He was reporting a lot of back pain alongside the weakness. A lumbar puncture was eventually done showing high protein which led to the dye diagnosis of GBS.  He had severe weakness at the peak and required intubation for respiratory support. At that time, he was unable to move his legs and could barely move his arms. He improved quite a bit after plasmapheresis and was discharged. However, a couple days after discharge he began to feel weak again and return to the emergency room. He was recently admitted for recurrent GBS. For that hospital stay he did not require intubation but did have a facemask oxygen. He also was treated with IVIG the second time around. He improved and was discharged home. He did outpatient physical therapy. He has had a fairly good in movement of his strength and is able to walk independently. However, he has had more dysesthetic  pain.   With the exception of more pain, he otherwise feels very close to his pre-GBS baseline.   Of note, he had a flu shot a few weeks before the first episode of GBS.  Prior to the 2 episodes of GBS late last year, he has had polyneuropathy for many years. That was manifested by some numbness in the feet and fingertips but no significant weakness. He did have dysesthetic pain that was treated medically. He reports that since the GBS the dysesthetic pain has increased.   Current pain has been treated with Lyrica, Duloxetine and oxycodone.  He was on the Lyrica and Cymbalta prior to his GBS In the past, he was treated with Nucynta. He does not recall the dose. He felt that the Nucynta helped better than oxycodone that he has been using since the hospital stay.     Of note, his polyneuropathy pain preceded the diagnosis of diabetes by quite a few years.  He also has a history of carpal tunnel syndrome and has had surgery in the past. His left hand is more painful than it was last year. In the past, carpal tunnel injections have helped his pain.  I personally reviewed the MRI's from 06/2014 and 07/2014 and concur with the reports:    Cervical MRI shows spinal stenosis at C4-C5, C5-C6 and C6-C7. There is no spinal cord compression. Thoracic spine shows T8-T9 disc extrusion to the left that does not cause spinal cord compression. There are mild degenerative changes in the lumbar spine. Contrast  was not used.   I also reviewed many of the notes and labs from his hospital stay. CSF Protein was greatly elevated at 118 and the CSF white blood cell count was 1.  REVIEW OF SYSTEMS: Constitutional: No fevers, chills, sweats, or change in appetite Eyes: No visual changes, double vision, eye pain Ear, nose and throat: No hearing loss, ear pain, nasal congestion, sore throat Cardiovascular: No chest pain, palpitations Respiratory: No shortness of breath at rest or with exertion.   No wheezes GastrointestinaI: No  nausea, vomiting, diarrhea, abdominal pain, fecal incontinence Genitourinary: No dysuria, urinary retention or frequency.  No nocturia. Musculoskeletal: No neck pain, back pain Integumentary: No rash, pruritus, skin lesions Neurological: as above Psychiatric: Mild depression at this time.  No anxiety.  ADD is stable. Endocrine: No palpitations, diaphoresis, change in appetite, change in weigh or increased thirst Hematologic/Lymphatic: No anemia, purpura, petechiae. Allergic/Immunologic: No itchy/runny eyes, nasal congestion, recent allergic reactions, rashes  ALLERGIES: Allergies  Allergen Reactions  . Codeine Itching    REACTION: Itching  . Metformin And Related Other (See Comments)    GI upset, diarrhea  . Nucynta [Tapentadol]     somnolence 11-15-14--Pt. sts. Nucynta does not cause somnolence/fim  . Shellfish Allergy     HOME MEDICATIONS:  Current outpatient prescriptions:  .  allopurinol (ZYLOPRIM) 300 MG tablet, Take 1 tablet (300 mg total) by mouth daily., Disp: 30 tablet, Rfl: 11 .  amLODipine (NORVASC) 10 MG tablet, Take 1 tablet (10 mg total) by mouth daily., Disp: 90 tablet, Rfl: 3 .  aspirin EC 81 MG tablet, Take 1 tablet (81 mg total) by mouth daily., Disp: , Rfl:  .  DULoxetine (CYMBALTA) 30 MG capsule, Take 1 capsule (30 mg total) by mouth 2 (two) times daily., Disp: 60 capsule, Rfl: 3 .  famotidine (PEPCID) 40 MG tablet, Take 1 tablet (40 mg total) by mouth 2 (two) times daily., Disp: 60 tablet, Rfl: 11 .  glipiZIDE (GLUCOTROL) 10 MG tablet, Take 10 mg by mouth daily before breakfast., Disp: , Rfl:  .  glucose blood (FREESTYLE TEST STRIPS) test strip, Use as instructed, Disp: 100 each, Rfl: 12 .  hydrochlorothiazide (MICROZIDE) 12.5 MG capsule, Take 1 capsule (12.5 mg total) by mouth daily., Disp: 90 capsule, Rfl: 3 .  LORazepam (ATIVAN) 0.5 MG tablet, Take 1 tablet (0.5 mg total) by mouth 2 (two) times daily., Disp: 60 tablet, Rfl: 2 .  methocarbamol (ROBAXIN) 500  MG tablet, Take 1 tablet (500 mg total) by mouth every 8 (eight) hours as needed for muscle spasms., Disp: 90 tablet, Rfl: 3 .  methylphenidate 36 MG PO CR tablet, Take 1 tablet (36 mg total) by mouth daily. To fill Nov 06, 2014, Disp: 30 tablet, Rfl: 0 .  oxyCODONE-acetaminophen (PERCOCET) 7.5-325 MG per tablet, Take 1 tablet by mouth every 6 (six) hours as needed for pain., Disp: 100 tablet, Rfl: 0 .  pantoprazole (PROTONIX) 40 MG tablet, Take 1 tablet (40 mg total) by mouth daily., Disp: 30 tablet, Rfl: 11 .  pregabalin (LYRICA) 200 MG capsule, Take 1 capsule (200 mg total) by mouth 3 (three) times daily., Disp: 90 capsule, Rfl: 1 .  diphenhydramine-acetaminophen (TYLENOL PM) 25-500 MG TABS, Take 1 tablet by mouth at bedtime as needed (headache)., Disp: , Rfl:  .  polyethylene glycol (MIRALAX / GLYCOLAX) packet, Take 17 g by mouth daily. (Patient not taking: Reported on 11/15/2014), Disp: 30 each, Rfl: 0  PAST MEDICAL HISTORY: Past Medical History  Diagnosis Date  .  COLONIC POLYPS 02/01/2008  . DIABETES MELLITUS, TYPE II 05/20/2010  . HYPERLIPIDEMIA 10/09/2008  . ANXIETY DEPRESSION 02/01/2008  . ERECTILE DYSFUNCTION 10/09/2008  . ADD 10/09/2008  . SLEEP APNEA, OBSTRUCTIVE 02/01/2008  . MORTON'S NEUROMA 05/20/2010  . PERIPHERAL NEUROPATHY 05/20/2010  . Other specified forms of hearing loss 06/27/2009  . HYPERTENSION 10/09/2008  . HEMORRHOIDS 02/01/2008  . ALLERGIC RHINITIS 10/09/2008  . Stricture and stenosis of esophagus 02/02/2008  . GERD 02/01/2008  . HIATAL HERNIA 02/01/2008  . ERECTILE DYSFUNCTION, ORGANIC 05/20/2010  . WRIST PAIN, LEFT 12/05/2009  . FOOT PAIN, LEFT 05/20/2010  . PERIPHERAL EDEMA 05/20/2010  . DYSPNEA 03/12/2010  . Abdominal pain, unspecified site 01/19/2009  . Abdominal pain, left lower quadrant 06/06/2010  . Type II or unspecified type diabetes mellitus without mention of complication, uncontrolled 11/14/2010  . Guillain Barr syndrome     PAST SURGICAL HISTORY: Past Surgical  History  Procedure Laterality Date  . Carpal tunnel release    . Rotator cuff repair    . Esophageal dilation  july 2009  . Esophagogastroduodenoscopy N/A 06/27/2014    Procedure: ESOPHAGOGASTRODUODENOSCOPY (EGD);  Surgeon: Lafayette Dragon, MD;  Location: [facility];  Service: Endoscopy;  Laterality: N/A;  . Eye surgery      catract surgery on both eyes    FAMILY HISTORY: Family History  Problem Relation Age of Onset  . Diabetes Mother   . Heart disease Mother   . Hyperlipidemia Mother   . Depression Mother   . Diabetes Brother   . Colon cancer Neg Hx     SOCIAL HISTORY:  History   Social History  . Marital Status: Single    Spouse Name: N/A  . Number of Children: N/A  . 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 Smoking status: Never Smoker   . Smokeless tobacco: Never Used  . Alcohol Use: Yes     Comment: occasional  . Drug Use: Yes    Special: Methaqualone  . Sexual Activity: Yes     Comment: before I got sick   Other Topics Concern  . Not on file   Social History Narrative     PHYSICAL EXAM  Filed Vitals:   11/15/14 1434  BP: 154/68  Pulse: 82  Resp: 14  Height: 5\' 9"  (1.753 m)  Weight: 218 lb (98.884 kg)    Body mass index is 32.18 kg/(m^2).   General: The patient is well-developed and well-nourished and in no acute distress  Eyes:  Funduscopic exam shows normal optic discs and retinal vessels.  Neck: The neck is supple, no carotid bruits are noted.  The neck is nontender.  Cardiovascular: The heart has a regular rate and rhythm with a normal S1 and S2. There were no murmurs, gallops or rubs. Lungs are clear to auscultation.  Skin: Extremities are without rash. He does have pedal edema, slightly worse on the left. There was mild distal foot pain, but probably not Morton's neuroma..  Musculoskeletal:  Back is nontender  Neurologic Exam  Mental status: The patient is alert  and oriented x 3 at the time of the examination. The patient has apparent normal recent and remote memory, with an apparently normal attention span and concentration ability.   Speech is normal.  Cranial nerves: Extraocular movements are full. Pupils are equal, round, and reactive to light and accomodation.  Visual fields are full.  Facial symmetry is present. There is good  facial sensation to soft touch bilaterally.Facial strength is normal.  Trapezius and sternocleidomastoid strength is normal. No dysarthria is noted.  The tongue is midline, and the patient has symmetric elevation of the soft palate. No obvious hearing deficits are noted.  Motor:  Muscle bulk is normal.   Tone is normal. Strength is  5 / 5 proximally in all 4 extremities but 4/5 in left APB, 4+ in bilateral interossei hand muscles, 4/5 in both EHL muscles  Sensory: Sensory testing is intact to pinprick, soft touch and vibration sensation in all 4 extremities.  Coordination: Cerebellar testing reveals good finger-nose-finger and heel-to-shin bilaterally.  Gait and station: Station is normal.   Gait is normal. Tandem gait is wide. Romberg is negative.   Reflexes: Deep tendon reflexes are symmetric and 1+ bilaterally at deltoid and knees, absent at ankles.   Plantar responses are flexor.    DIAGNOSTIC DATA (LABS, IMAGING, TESTING) - I reviewed patient records, labs, notes, testing and imaging myself where available.  Lab Results  Component Value Date   WBC 6.9 08/09/2014   HGB 13.6 08/09/2014   HCT 39.5 08/09/2014   MCV 89.0 08/09/2014   PLT 271 08/09/2014      Component Value Date/Time   NA 138 08/29/2014 1105   K 4.2 08/29/2014 1105   CL 102 08/29/2014 1105   CO2 32 08/29/2014 1105   GLUCOSE 106* 08/29/2014 1105   BUN 12 08/29/2014 1105   CREATININE 0.81 08/29/2014 1105   CALCIUM 9.2 08/29/2014 1105   PROT 6.7 08/29/2014 1105   ALBUMIN 3.4* 08/29/2014 1105   AST 20 08/29/2014 1105   ALT 20 08/29/2014 1105    ALKPHOS 78 08/29/2014 1105   BILITOT 0.3 08/29/2014 1105   GFRNONAA >90 08/06/2014 0240   GFRAA >90 08/06/2014 0240   Lab Results  Component Value Date   CHOL 205* 08/29/2014   HDL 47.20 08/29/2014   LDLCALC 130* 05/04/2014   LDLDIRECT 111.0 08/29/2014   TRIG 344.0* 08/29/2014   CHOLHDL 4 08/29/2014   Lab Results  Component Value Date   HGBA1C 5.8 08/29/2014   No results found for: [id number] Lab Results  Component Value Date   TSH 5.180* 06/22/2014       ASSESSMENT AND PLAN  Recurrent GBS (Guillain-Barre syndrome)  Diabetic polyneuropathy associated with type 2 diabetes mellitus  Attention deficit disorder  Chronic pain syndrome  Dysesthesia  Carpal tunnel syndrome of left wrist   In summary, Frank [name] is a 59 year old man who has worsening dysesthetic neuropathic pain since a severe episode of Guillain-Barr syndrome in November that relapsed a month later.   He has made an excellent recovery from his GBS and is close to his baseline. Recurrent GBS is unusual but does occur at times when the the disease is slower than the washout of the initial treatment. We discussed that another episode of GBS further in the future is unlikely but not impossible.  His pain was better controlled on Nucynta than oxycodone and I will place him back on that medication. Hopefully this will improve her he will continue on Lyrica and duloxetine. His carpal tunnel pain is worse at the left wrist and I did a carpal tunnel injection with 1 mL of lidocaine containing 2 mg of Decadron. He tolerated the injection better and the pain was improved after several minutes.  His mood issues and attention deficit issues are stable. He will continue on his medications for those.  He will return to see me in 4  months or sooner if he has new or worsening neurologic symptoms.  50 minute face-to-face evaluation with greater than one half of the time counseling and coordinating care for his  Guillain-Barr syndrome CT inj  Jarek Longton A. [name], MD, PhD 09/11/7469, 5:95 PM Certified in Neurology, Clinical Neurophysiology, Sleep Medicine, Pain Medicine and Neuroimaging  [facility] [address], [place], [place] [address] [phone]

## 2014-11-21 ENCOUNTER — Ambulatory Visit: Payer: BLUE CROSS/BLUE SHIELD

## 2014-11-23 NOTE — Therapy (Signed)
[facility] [address] [place], [place], [address] Phone: [phone]   Fax:  [phone]  Patient Details  Name: Frank [name] MRN: [id number] Date of Birth: 10-19-1955 Referring Provider:  No ref. provider found  Encounter Date: 11/23/2014  PHYSICAL THERAPY DISCHARGE SUMMARY  Visits from Start of Care: 3  Current functional level related to goals / functional outcomes: Pt did not meet any therapy goals as he only attended the evaluation + 2 therapy treatment sessions.   Remaining deficits: Muscle weakness, decreased balance, chronic pain, decreased awareness of deficits, impaired gait pattern   Education / Equipment: Home exercise program  Plan: Patient agrees to discharge.  Patient goals were not met. Patient is being discharged due to the patient's request. pt reports that he wishes to d/c due to financial reasons. ?????      Delrae Sawyers, PT,DPT,NCS 11/23/2014 1:58 PM Phone [phone] FAX [phone]         [facility] [address] [place], [place], [address] Phone: [phone]   Fax:  [phone]

## 2014-11-24 ENCOUNTER — Ambulatory Visit: Payer: BLUE CROSS/BLUE SHIELD

## 2014-11-27 ENCOUNTER — Ambulatory Visit: Payer: BLUE CROSS/BLUE SHIELD | Admitting: Physical Medicine & Rehabilitation

## 2014-12-06 ENCOUNTER — Other Ambulatory Visit: Payer: Self-pay | Admitting: Internal Medicine

## 2014-12-06 ENCOUNTER — Other Ambulatory Visit: Payer: Self-pay | Admitting: Physical Medicine & Rehabilitation

## 2014-12-07 NOTE — Telephone Encounter (Signed)
Rx sent to pharmacy   

## 2014-12-07 NOTE — Telephone Encounter (Signed)
Done hardcopy to Frank [name]  

## 2014-12-12 ENCOUNTER — Telehealth: Payer: Self-pay | Admitting: Internal Medicine

## 2014-12-12 MED ORDER — METHYLPHENIDATE HCL ER 36 MG PO TB24: 36.0000 mg | ORAL_TABLET | Freq: Every day | ORAL | Status: DC

## 2014-12-12 NOTE — Telephone Encounter (Signed)
Pt called in and need refills on his methylphenidate 36 MG PO CR tablet [[id number]] and Lorazepem   He would like phone call when they are ready to be picked up Best number [phone]

## 2014-12-12 NOTE — Telephone Encounter (Signed)
Too early for lorazepam since last rx was just done on apr 28, maybe he is not aware of this  [facility] - Done hardcopy to [employer]

## 2014-12-12 NOTE — Telephone Encounter (Signed)
Spoke with patient and told him that the RX are ready for pickup today.

## 2014-12-25 ENCOUNTER — Telehealth: Payer: Self-pay | Admitting: *Deleted

## 2014-12-25 NOTE — Telephone Encounter (Signed)
Patient form from [facility] on [employer].

## 2014-12-29 DIAGNOSIS — Z0289 Encounter for other administrative examinations: Secondary | ICD-10-CM

## 2015-01-09 ENCOUNTER — Telehealth: Payer: Self-pay | Admitting: Neurology

## 2015-01-09 MED ORDER — TAPENTADOL HCL 75 MG PO TABS: 1.0000 | ORAL_TABLET | Freq: Three times a day (TID) | ORAL | Status: DC | PRN

## 2015-01-09 NOTE — Telephone Encounter (Signed)
Rx. printed, signed, up front [facility].  LMOM to let pt. know rx. will be available to be picked up tomorrow am/fim

## 2015-01-09 NOTE — Telephone Encounter (Signed)
Patient called and requested a refill on Rx. NUCYNTA. Please call and advise.

## 2015-01-16 ENCOUNTER — Telehealth: Payer: Self-pay | Admitting: Internal Medicine

## 2015-01-16 MED ORDER — METHYLPHENIDATE HCL ER 36 MG PO TB24: 36.0000 mg | ORAL_TABLET | Freq: Every day | ORAL | Status: DC

## 2015-01-16 NOTE — Telephone Encounter (Signed)
Patient requesting refill for methylphenidate 36 MG PO CR tablet [[id number]

## 2015-01-16 NOTE — Telephone Encounter (Signed)
Rx placed in cabinet for pt pick up, pt advised via VM

## 2015-01-16 NOTE — Telephone Encounter (Signed)
Done hardcopy to [facility]  

## 2015-01-24 NOTE — Telephone Encounter (Signed)
PA in process

## 2015-01-24 NOTE — Telephone Encounter (Signed)
Patient called stating that the insurance needs prior authorization on the medication in the note below

## 2015-01-25 NOTE — Telephone Encounter (Signed)
PA refilled 01/25/2015

## 2015-02-01 NOTE — Telephone Encounter (Signed)
Patient has called back and pharmacy has not yet received authorization for the medication below

## 2015-02-02 NOTE — Telephone Encounter (Addendum)
PA was approved, pharmacy was notified and so was pt.  Approval dates are 01/12/15-02/02/16

## 2015-02-02 NOTE — Telephone Encounter (Signed)
Pt called in back in about his PA, do you happen to know the status of this?    [phone]

## 2015-02-13 ENCOUNTER — Other Ambulatory Visit: Payer: Self-pay | Admitting: Physical Medicine & Rehabilitation

## 2015-02-20 ENCOUNTER — Encounter: Payer: Self-pay | Admitting: *Deleted

## 2015-02-20 ENCOUNTER — Other Ambulatory Visit: Payer: Self-pay | Admitting: *Deleted

## 2015-02-20 MED ORDER — TAPENTADOL HCL 75 MG PO TABS: 1.0000 | ORAL_TABLET | Freq: Three times a day (TID) | ORAL | Status: DC | PRN

## 2015-02-20 NOTE — Telephone Encounter (Signed)
Request entered, forwarded to provider for review.  

## 2015-02-20 NOTE — Progress Notes (Signed)
Nucynta rx. up front [facility]/fim 

## 2015-02-21 ENCOUNTER — Telehealth: Payer: Self-pay | Admitting: *Deleted

## 2015-02-21 DIAGNOSIS — Z0289 Encounter for other administrative examinations: Secondary | ICD-10-CM

## 2015-02-21 NOTE — Telephone Encounter (Signed)
Patient form on [facility] desk. 

## 2015-02-23 ENCOUNTER — Telehealth: Payer: Self-pay | Admitting: *Deleted

## 2015-02-23 NOTE — Telephone Encounter (Signed)
[place].  Received a note from Frank [name] that his ins. no longer covers Lyrica.  Per RAS, can offer Gabapentin 800mg  po tid./fim

## 2015-03-13 ENCOUNTER — Other Ambulatory Visit: Payer: Self-pay | Admitting: Internal Medicine

## 2015-03-13 ENCOUNTER — Other Ambulatory Visit: Payer: Self-pay | Admitting: *Deleted

## 2015-03-13 ENCOUNTER — Telehealth: Payer: Self-pay | Admitting: *Deleted

## 2015-03-13 MED ORDER — METHYLPHENIDATE HCL ER 36 MG PO TB24: 36.0000 mg | ORAL_TABLET | Freq: Every day | ORAL | Status: DC

## 2015-03-13 NOTE — Telephone Encounter (Signed)
Pt requesting refill on his generic concerta, also want refill on his lorazepam. Pls advise...Frank [name]

## 2015-03-13 NOTE — Telephone Encounter (Signed)
Done hardcopy to [place] already done earlier today

## 2015-03-13 NOTE — Telephone Encounter (Signed)
Done hardcopy to [facility]  

## 2015-03-14 NOTE — Telephone Encounter (Signed)
Rx faxed to pharmacy  

## 2015-03-14 NOTE — Telephone Encounter (Signed)
Called pt no answer LMOM rx ready for pick-up. Rx place in cabinet...Frank [name]

## 2015-03-19 DIAGNOSIS — Z0289 Encounter for other administrative examinations: Secondary | ICD-10-CM

## 2015-04-17 ENCOUNTER — Other Ambulatory Visit: Payer: Self-pay | Admitting: Neurology

## 2015-04-17 ENCOUNTER — Telehealth: Payer: Self-pay | Admitting: *Deleted

## 2015-04-17 MED ORDER — METHYLPHENIDATE HCL ER 36 MG PO TB24: 36.0000 mg | ORAL_TABLET | Freq: Every day | ORAL | Status: DC

## 2015-04-17 MED ORDER — PREGABALIN 200 MG PO CAPS: 200.0000 mg | ORAL_CAPSULE | Freq: Two times a day (BID) | ORAL | Status: DC

## 2015-04-17 MED ORDER — TAPENTADOL HCL 75 MG PO TABS: 1.0000 | ORAL_TABLET | Freq: Three times a day (TID) | ORAL | Status: DC | PRN

## 2015-04-17 MED ORDER — METHOCARBAMOL 500 MG PO TABS: 500.0000 mg | ORAL_TABLET | Freq: Three times a day (TID) | ORAL | Status: DC | PRN

## 2015-04-17 NOTE — Telephone Encounter (Signed)
Left msg on triage requesting refill on his Concerta & Robaxin...Frank [name]

## 2015-04-17 NOTE — Telephone Encounter (Signed)
Request forwarded to provider for review.

## 2015-04-17 NOTE — Telephone Encounter (Signed)
Robaxin refill done erx  concerta - Done hardcopy to [facility]

## 2015-04-17 NOTE — Telephone Encounter (Signed)
Pt needs refill on Tapentadol HCl 75 MG TABS and call pharm for pregabalin (LYRICA) 200 MG capsule ( [facility]). Thank you

## 2015-04-17 NOTE — Telephone Encounter (Signed)
Notified pt rx ready for pick-up place rx in cabinet...Frank [name]

## 2015-04-18 ENCOUNTER — Telehealth: Payer: Self-pay | Admitting: Neurology

## 2015-04-18 ENCOUNTER — Encounter: Payer: Self-pay | Admitting: *Deleted

## 2015-04-18 ENCOUNTER — Other Ambulatory Visit: Payer: Self-pay | Admitting: Registered Nurse

## 2015-04-18 NOTE — Telephone Encounter (Signed)
It appears at [facility] in April, Lyrica Rx was written for 200mg , one twice daily.  Patient states he is currently taking one three times daily, and would like to know if a new Rx can be written for this dose.  Please advise.  Thank you.

## 2015-04-18 NOTE — Telephone Encounter (Signed)
Pt called and states that his rx for pregabalin (LYRICA) 200 MG capsule is different. He says he is to take 3 pill a day. The Rx has 2 xday. Can it be called into the pharmacy? Pt picked up written rx from the office today (04/18/15) Please call and advise [phone]. Pt is out of medication and would like a call today please.  Thank you

## 2015-04-18 NOTE — Progress Notes (Signed)
Lyrica and Nucynta rx's up front [facility]/fim

## 2015-04-18 NOTE — Telephone Encounter (Signed)
Patient has called back requesting advise.

## 2015-04-18 NOTE — Telephone Encounter (Signed)
I have spoken with Re[facility] this afternoon and per RAS, advised he should continue Lyrica 200mg  tid.  He sts. his last rx. was for 200mg  tid.  I am not able to find a rx. that was tid.  I have given him a sooner appt to discuss with RAS, and in the meantime he will continue Lyrica 200mg  bid/fim

## 2015-04-19 MED ORDER — GABAPENTIN 800 MG PO TABS: 800.0000 mg | ORAL_TABLET | Freq: Three times a day (TID) | ORAL | Status: DC

## 2015-04-19 NOTE — Telephone Encounter (Signed)
I have spoken with Marden Noble and per RAS, advised that although all medications have 2 names, a brand and a generic, that so far only brand name Lyrica is being sold.  Doug sts. he would still like to go back to Gabapentin.  Per RAS, ok for Gabapentin 800mg  po tid (last dose pt. was on).  I have escribed rx. to [facility] per Doug's request/fim

## 2015-04-19 NOTE — Telephone Encounter (Signed)
Patient called stating his Ins will not pay brand name Lyrica(cost is too high)  and he said generic does not work as well and does not use it. He is requesting to take gabapentin again. Please call to [employer]. Please call advise. Patient can be reached at [phone].

## 2015-04-19 NOTE — Addendum Note (Signed)
Addended by: France Ravens I on: 04/19/2015 02:48 PM   Modules accepted: Orders

## 2015-04-30 ENCOUNTER — Other Ambulatory Visit: Payer: Self-pay | Admitting: Neurology

## 2015-04-30 ENCOUNTER — Ambulatory Visit (INDEPENDENT_AMBULATORY_CARE_PROVIDER_SITE_OTHER): Payer: BC Managed Care – PPO | Admitting: Neurology

## 2015-04-30 ENCOUNTER — Encounter: Payer: Self-pay | Admitting: Neurology

## 2015-04-30 VITALS — BP 146/74 | HR 92 | Resp 16 | Ht 69.0 in | Wt 229.0 lb

## 2015-04-30 DIAGNOSIS — R208 Other disturbances of skin sensation: Secondary | ICD-10-CM

## 2015-04-30 DIAGNOSIS — E1142 Type 2 diabetes mellitus with diabetic polyneuropathy: Secondary | ICD-10-CM

## 2015-04-30 DIAGNOSIS — F341 Dysthymic disorder: Secondary | ICD-10-CM

## 2015-04-30 DIAGNOSIS — R269 Unspecified abnormalities of gait and mobility: Secondary | ICD-10-CM

## 2015-04-30 DIAGNOSIS — F909 Attention-deficit hyperactivity disorder, unspecified type: Secondary | ICD-10-CM | POA: Diagnosis not present

## 2015-04-30 DIAGNOSIS — G5602 Carpal tunnel syndrome, left upper limb: Secondary | ICD-10-CM | POA: Diagnosis not present

## 2015-04-30 DIAGNOSIS — G5603 Carpal tunnel syndrome, bilateral upper limbs: Secondary | ICD-10-CM

## 2015-04-30 DIAGNOSIS — G610 Guillain-Barre syndrome: Secondary | ICD-10-CM | POA: Diagnosis not present

## 2015-04-30 DIAGNOSIS — F988 Other specified behavioral and emotional disorders with onset usually occurring in childhood and adolescence: Secondary | ICD-10-CM

## 2015-04-30 DIAGNOSIS — G5601 Carpal tunnel syndrome, right upper limb: Secondary | ICD-10-CM | POA: Diagnosis not present

## 2015-04-30 MED ORDER — TAPENTADOL HCL 75 MG PO TABS: 1.0000 | ORAL_TABLET | Freq: Three times a day (TID) | ORAL | Status: DC | PRN

## 2015-04-30 MED ORDER — PREGABALIN 200 MG PO CAPS: 200.0000 mg | ORAL_CAPSULE | Freq: Three times a day (TID) | ORAL | Status: DC

## 2015-04-30 MED ORDER — PREGABALIN 200 MG PO CAPS: 200.0000 mg | ORAL_CAPSULE | Freq: Two times a day (BID) | ORAL | Status: DC

## 2015-04-30 MED ORDER — DULOXETINE HCL 30 MG PO CPEP: 30.0000 mg | ORAL_CAPSULE | Freq: Three times a day (TID) | ORAL | Status: DC

## 2015-04-30 MED ORDER — ARIPIPRAZOLE 5 MG PO TABS: 5.0000 mg | ORAL_TABLET | Freq: Every day | ORAL | Status: DC

## 2015-04-30 MED ORDER — CYCLOBENZAPRINE HCL 5 MG PO TABS: 5.0000 mg | ORAL_TABLET | Freq: Every day | ORAL | Status: DC

## 2015-04-30 NOTE — Progress Notes (Signed)
[facility]  PATIENT: Frank [name] DOB: [birth year]  _________________________________   HISTORICAL  CHIEF COMPLAINT:  Chief Complaint  Patient presents with  . Peripheral Neuropathy    Last week, Doug called and said insurance would  not cover Lyrica, and requested to go back to Gabapentin 800mg  tid.  Today he sts. he discovered insurance actually will cover Lyrica and sts. he would like to stop Gabapentin and restart Lyrica 200mg  tid.  Sts. most of his pain continues to be bilat knees to feet, left leg worse than right.  Sts. he is having more stiffness in bilat hands./fim    HISTORY OF PRESENT ILLNESS:  Frank [name] is a 59 year old man with a chronic peripheral neuropathy who had superimposed Guillain Barr syndrome in November 2015 with a recurrence one month later. She sees episodes, he has had worsening neuropathic pain.   Chronic neuropathic pain:   Prior to the 2 episodes of GBS in 2015, he has had polyneuropathy for many years. He had numbness and dysesthesia in the feet and fingertips but no significant weakness.  He reports that since the GBS the dysesthetic pain has increased.  Pain is in the feet and up to mid-shin.   Current pain has been treated with Gabapentin, Duloxetine and Nucynta.  He would like to switch back to Lyrica. Marland Kitchen He felt that the Nucynta helped better than oxycodone.    Of note, his polyneuropathy pain preceded the diagnosis of diabetes by quite a few years.  Mood:   He feels depression is not doing as well and he remains on Cymbalta.    He would prefer not to be referred to a psychiatrist at this time.   He also has anxiety, despite lorazepam.   He feels the depression is worse than the anxiety.     CTS:   He also has a history of carpal tunnel syndrome and has had surgery in the past. His left hand is more painful than it was last year. In the past, carpal tunnel injections have helped a little bit but not enough for another shot  today.  .  GBS History:   The initial episode of GBS was treated with plasmapheresis. He reports that the diagnosis took a couple weeks and he had several emergency room visits. Initially he was sent to a skilled nursing facility. He was reporting a lot of back pain alongside the weakness. A lumbar puncture was eventually done showing high protein which led to the dye diagnosis of GBS.  He had severe weakness at the peak and required intubation for respiratory support. At that time, he was unable to move his legs and could barely move his arms. He improved quite a bit after plasmapheresis and was discharged. However, a couple days after discharge he began to feel weak again and return to the emergency room. He was recently admitted for recurrent GBS. For that hospital stay he did not require intubation but did have a facemask oxygen. He also was treated with IVIG the second time around. He improved and was discharged home. He did outpatient physical therapy. He has had a fairly good in movement of his strength and is able to walk independently. However, he has had more dysesthetic pain.   With the exception of more pain, he otherwise feels very close to his pre-GBS baseline.   Of note, he had a flu shot a few weeks before the first episode of GBS.  MRI's from 06/2014 and 07/2014:  Cervical MRI shows spinal stenosis at C4-C5, C5-C6 and C6-C7. There is no spinal cord compression. Thoracic spine shows T8-T9 disc extrusion to the left that does not cause spinal cord compression. There are mild degenerative changes in the lumbar spine. Contrast was not used.   I also reviewed many of the notes and labs from his hospital stay. CSF Protein was greatly elevated at 118 and the CSF white blood cell count was 1.    REVIEW OF SYSTEMS: Constitutional: No fevers, chills, sweats, or change in appetite Eyes: No visual changes, double vision, eye pain Ear, nose and throat: No hearing loss, ear pain, nasal congestion,  sore throat Cardiovascular: No chest pain, palpitations Respiratory: No shortness of breath at rest or with exertion.   No wheezes GastrointestinaI: No nausea, vomiting, diarrhea, abdominal pain, fecal incontinence Genitourinary: No dysuria, urinary retention or frequency.  No nocturia. Musculoskeletal: No neck pain, back pain Integumentary: No rash, pruritus, skin lesions Neurological: as above Psychiatric: Anxiety and depression at this time.   ADD is stable. Endocrine: No palpitations, diaphoresis, change in appetite, change in weigh or increased thirst Hematologic/Lymphatic: No anemia, purpura, petechiae. Allergic/Immunologic: No itchy/runny eyes, nasal congestion, recent allergic reactions, rashes  ALLERGIES: Allergies  Allergen Reactions  . Codeine Itching    REACTION: Itching  . Metformin And Related Other (See Comments)    GI upset, diarrhea  . Shellfish Allergy     HOME MEDICATIONS:  Current outpatient prescriptions:  .  allopurinol (ZYLOPRIM) 300 MG tablet, Take 1 tablet (300 mg total) by mouth daily., Disp: 30 tablet, Rfl: 11 .  amLODipine (NORVASC) 10 MG tablet, Take 1 tablet (10 mg total) by mouth daily., Disp: 90 tablet, Rfl: 3 .  aspirin EC 81 MG tablet, Take 1 tablet (81 mg total) by mouth daily., Disp: , Rfl:  .  DULoxetine (CYMBALTA) 30 MG capsule, Take 1 capsule (30 mg total) by mouth 3 (three) times daily., Disp: 90 capsule, Rfl: 5 .  famotidine (PEPCID) 40 MG tablet, Take 1 tablet (40 mg total) by mouth 2 (two) times daily., Disp: 60 tablet, Rfl: 11 .  glipiZIDE (GLUCOTROL) 10 MG tablet, Take 10 mg by mouth daily before breakfast., Disp: , Rfl:  .  glucose blood (FREESTYLE TEST STRIPS) test strip, Use as instructed, Disp: 100 each, Rfl: 12 .  hydrochlorothiazide (MICROZIDE) 12.5 MG capsule, Take 1 capsule (12.5 mg total) by mouth daily., Disp: 90 capsule, Rfl: 3 .  LORazepam (ATIVAN) 0.5 MG tablet, TAKE 1 TABLET BY MOUTH TWICE DAILY, Disp: 60 tablet, Rfl:  2 .  methocarbamol (ROBAXIN) 500 MG tablet, Take 1 tablet (500 mg total) by mouth every 8 (eight) hours as needed for muscle spasms., Disp: 90 tablet, Rfl: 3 .  methylphenidate 36 MG PO CR tablet, Take 1 tablet (36 mg total) by mouth daily., Disp: 30 tablet, Rfl: 0 .  pantoprazole (PROTONIX) 40 MG tablet, Take 1 tablet (40 mg total) by mouth daily., Disp: 30 tablet, Rfl: 11 .  pregabalin (LYRICA) 200 MG capsule, Take 1 capsule (200 mg total) by mouth 2 (two) times daily., Disp: 60 capsule, Rfl: 0 .  Tapentadol HCl 75 MG TABS, Take 1 tablet (75 mg total) by mouth 3 (three) times daily as needed., Disp: 90 tablet, Rfl: 0 .  diphenhydramine-acetaminophen (TYLENOL PM) 25-500 MG TABS, Take 1 tablet by mouth at bedtime as needed (headache)., Disp: , Rfl:  .  gabapentin (NEURONTIN) 800 MG tablet, Take 1 tablet (800 mg total) by mouth 3 (three) times daily. (  Patient not taking: Reported on 04/30/2015), Disp: 90 tablet, Rfl: 3 .  polyethylene glycol (MIRALAX / GLYCOLAX) packet, Take 17 g by mouth daily. (Patient not taking: Reported on 11/15/2014), Disp: 30 each, Rfl: 0  PAST MEDICAL HISTORY: Past Medical History  Diagnosis Date  . COLONIC POLYPS 02/01/2008  . DIABETES MELLITUS, TYPE II 05/20/2010  . HYPERLIPIDEMIA 10/09/2008  . ANXIETY DEPRESSION 02/01/2008  . ERECTILE DYSFUNCTION 10/09/2008  . ADD 10/09/2008  . SLEEP APNEA, OBSTRUCTIVE 02/01/2008  . MORTON'S NEUROMA 05/20/2010  . PERIPHERAL NEUROPATHY 05/20/2010  . Other specified forms of hearing loss 06/27/2009  . HYPERTENSION 10/09/2008  . HEMORRHOIDS 02/01/2008  . ALLERGIC RHINITIS 10/09/2008  . Stricture and stenosis of esophagus 02/02/2008  . GERD 02/01/2008  . HIATAL HERNIA 02/01/2008  . ERECTILE DYSFUNCTION, ORGANIC 05/20/2010  . WRIST PAIN, LEFT 12/05/2009  . FOOT PAIN, LEFT 05/20/2010  . PERIPHERAL EDEMA 05/20/2010  . DYSPNEA 03/12/2010  . Abdominal pain, unspecified site 01/19/2009  . Abdominal pain, left lower quadrant 06/06/2010  . Type II or  unspecified type diabetes mellitus without mention of complication, uncontrolled 11/14/2010  . Guillain Barr syndrome     PAST SURGICAL HISTORY: Past Surgical History  Procedure Laterality Date  . Carpal tunnel release    . Rotator cuff repair    . Esophageal dilation  july 2009  . Esophagogastroduodenoscopy N/A 06/27/2014    Procedure: ESOPHAGOGASTRODUODENOSCOPY (EGD);  Surgeon: Lafayette Dragon, MD;  Location: [facility];  Service: Endoscopy;  Laterality: N/A;  . Eye surgery      catract surgery on both eyes    FAMILY HISTORY: Family History  Problem Relation Age of Onset  . Diabetes Mother   . Heart disease Mother   . Hyperlipidemia Mother   . Depression Mother   . Diabetes Brother   . Colon cancer Neg Hx     SOCIAL HISTORY:  Social History   Social History  . Marital Status: Single    Spouse Name: N/A  . Number of Children: N/A  . 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 Smoking status: Never Smoker   . Smokeless tobacco: Never Used  . Alcohol Use: Yes     Comment: occasional  . Drug Use: Yes    Special: Methaqualone  . Sexual Activity: Yes     Comment: before I got sick   Other Topics Concern  . Not on file   Social History Narrative     PHYSICAL EXAM  Filed Vitals:   04/30/15 1035  BP: 146/74  Pulse: 92  Resp: 16  Height: 5\' 9"  (1.753 m)  Weight: 229 lb (103.874 kg)    Body mass index is 33.8 kg/(m^2).   General: The patient is well-developed and well-nourished and in no acute distress  Skin: Extremities are without rash. He has mild pedal edema, slightly worse on the left.    Neurologic Exam  Mental status: The patient is alert and oriented x 3 at the time of the examination. The patient has apparent normal recent and remote memory, with an apparently normal attention span and concentration ability.   Speech is normal.  Cranial nerves: Extraocular movements are full.  Pupils are equal, round, and reactive to light and accomodation.  Visual fields are full.  Facial symmetry is present. There is good facial sensation to soft touch bilaterally.Facial strength is normal.  Trapezius and sternocleidomastoid strength is normal. No dysarthria is noted.  The  tongue is midline, and the patient has symmetric elevation of the soft palate. No obvious hearing deficits are noted.  Motor:  Muscle bulk is normal.   Tone is normal. Strength is  5 / 5 proximally in all 4 extremities but 4/5 in left APB, 4+ in bilateral interossei hand muscles, 4/5 in both EHL muscles  Sensory: Sensory testing is intact to pinprick, soft touch and vibration sensation in all 4 extremities.  Coordination: Cerebellar testing reveals good finger-nose-finger and heel-to-shin bilaterally.  Gait and station: Station is normal.   Gait is normal. Tandem gait is wide. Romberg is negative.   Reflexes: Deep tendon reflexes are symmetric and 1+ bilaterally at deltoid and knees, absent at ankles.        DIAGNOSTIC DATA (LABS, IMAGING, TESTING) - I reviewed patient records, labs, notes, testing and imaging myself where available.  Lab Results  Component Value Date   WBC 6.9 08/09/2014   HGB 13.6 08/09/2014   HCT 39.5 08/09/2014   MCV 89.0 08/09/2014   PLT 271 08/09/2014      Component Value Date/Time   NA 138 08/29/2014 1105   K 4.2 08/29/2014 1105   CL 102 08/29/2014 1105   CO2 32 08/29/2014 1105   GLUCOSE 106* 08/29/2014 1105   BUN 12 08/29/2014 1105   CREATININE 0.81 08/29/2014 1105   CALCIUM 9.2 08/29/2014 1105   PROT 6.7 08/29/2014 1105   ALBUMIN 3.4* 08/29/2014 1105   AST 20 08/29/2014 1105   ALT 20 08/29/2014 1105   ALKPHOS 78 08/29/2014 1105   BILITOT 0.3 08/29/2014 1105   GFRNONAA >90 08/06/2014 0240   GFRAA >90 08/06/2014 0240   Lab Results  Component Value Date   CHOL 205* 08/29/2014   HDL 47.20 08/29/2014   LDLCALC 130* 05/04/2014   LDLDIRECT 111.0 08/29/2014   TRIG  344.0* 08/29/2014   CHOLHDL 4 08/29/2014   Lab Results  Component Value Date   HGBA1C 5.8 08/29/2014   No results found for: [id number] Lab Results  Component Value Date   TSH 5.180* 06/22/2014       ASSESSMENT AND PLAN  Diabetic polyneuropathy associated with type 2 diabetes mellitus  Guillain Barr syndrome  Bilateral carpal tunnel syndrome  Attention deficit disorder  ANXIETY DEPRESSION  Dysesthesia  Gait disorder    1.   Continue Nucynta and change back to Lyrica. 2.   Continue Cymbalta for pain and mood.  Continue lorazepam 3.    Add Abilify for his agitated depression.   If not better, consider a referral to Psych --- he has nightmares of being back in the hospital intubated again so might have a PTSD 4.    Stop Robaxin and add nighttime cyclobenzaprine (might help sleep and PTSD like symptoms) 5.   He will return to see me in 4-5 months or sooner if he has new or worsening neurologic symptoms.  45 minute face-to-face evaluation with greater than one half of the time counseling and coordinating care, predominantly related to his anxiety or depression and possibility of PTSD from his hospital stay.  Richard A. [name], MD, PhD 03/19/9832, 82:50 AM Certified in Neurology, Clinical Neurophysiology, Sleep Medicine, Pain Medicine and Neuroimaging  [facility] [address], [place], [place] [address] [phone]

## 2015-04-30 NOTE — Patient Instructions (Signed)
1.   We will switch back to Lyrica 200 mg by mouth 3 times a day 2.   We will stop Robaxin and add 1 pill of cyclobenzaprine at bedtime 3   we will add Abilify which will hopefully help the agitation 4.   If not better in 3 or 4 weeks I would like you to call back so we can refer you to psychiatry

## 2015-05-14 ENCOUNTER — Ambulatory Visit: Payer: BC Managed Care – PPO | Admitting: Neurology

## 2015-05-16 ENCOUNTER — Ambulatory Visit: Payer: BLUE CROSS/BLUE SHIELD | Admitting: Internal Medicine

## 2015-05-16 DIAGNOSIS — Z0289 Encounter for other administrative examinations: Secondary | ICD-10-CM

## 2015-05-16 DIAGNOSIS — R4689 Other symptoms and signs involving appearance and behavior: Secondary | ICD-10-CM | POA: Insufficient documentation

## 2015-05-18 ENCOUNTER — Telehealth: Payer: Self-pay | Admitting: Internal Medicine

## 2015-05-18 NOTE — Telephone Encounter (Signed)
Please advise in MD's absence, thanks 

## 2015-05-18 NOTE — Telephone Encounter (Signed)
Please pass on to MD who is there I left after noon. Thanks, [employer]

## 2015-05-18 NOTE — Telephone Encounter (Signed)
Pt called in and is needing a refill on is methylphenidate 36 MG PO CR tablet [[id number]]

## 2015-05-21 ENCOUNTER — Other Ambulatory Visit: Payer: Self-pay | Admitting: Internal Medicine

## 2015-05-21 MED ORDER — METHYLPHENIDATE HCL ER 36 MG PO TB24: 36.0000 mg | ORAL_TABLET | Freq: Every day | ORAL | Status: DC

## 2015-05-21 NOTE — Telephone Encounter (Signed)
Printed and signed, please UDS at pickup.  

## 2015-05-21 NOTE — Telephone Encounter (Signed)
Pt informed, Rx in cabinet for pt pick up  

## 2015-06-04 ENCOUNTER — Telehealth: Payer: Self-pay | Admitting: *Deleted

## 2015-06-04 DIAGNOSIS — Z0289 Encounter for other administrative examinations: Secondary | ICD-10-CM

## 2015-06-04 NOTE — Telephone Encounter (Signed)
Patient form on [facility] desk. 

## 2015-06-07 ENCOUNTER — Other Ambulatory Visit: Payer: Self-pay | Admitting: Internal Medicine

## 2015-06-07 NOTE — Telephone Encounter (Signed)
Rx faxed to pharmacy  

## 2015-06-07 NOTE — Telephone Encounter (Signed)
Done hardcopy to [facility]  

## 2015-06-19 ENCOUNTER — Telehealth: Payer: Self-pay | Admitting: Internal Medicine

## 2015-06-19 MED ORDER — METHYLPHENIDATE HCL ER 36 MG PO TB24: 36.0000 mg | ORAL_TABLET | Freq: Every day | ORAL | Status: DC

## 2015-06-19 NOTE — Telephone Encounter (Signed)
Pt informed, Rx in cabinet for pt pick up  

## 2015-06-19 NOTE — Telephone Encounter (Signed)
Patient requesting refill for methylphenidate 36 MG PO CR tablet [[id number]

## 2015-06-19 NOTE — Telephone Encounter (Signed)
Done hardcopy to [facility]  

## 2015-07-10 DIAGNOSIS — Z0271 Encounter for disability determination: Secondary | ICD-10-CM

## 2015-07-24 ENCOUNTER — Telehealth: Payer: Self-pay | Admitting: Internal Medicine

## 2015-07-24 MED ORDER — METHYLPHENIDATE HCL ER 36 MG PO TB24: 36.0000 mg | ORAL_TABLET | Freq: Every day | ORAL | Status: DC

## 2015-07-24 NOTE — Telephone Encounter (Signed)
printed

## 2015-07-24 NOTE — Telephone Encounter (Signed)
Pt informed via VM, Rx in cabinet for pt pick up  

## 2015-07-24 NOTE — Telephone Encounter (Signed)
Pt requesting handwritten refill for methylphenidate 36 MG PO CR tablet [id number] Please call when ready

## 2015-07-24 NOTE — Telephone Encounter (Signed)
Please advise in PCP's absence, thanks! 

## 2015-08-15 ENCOUNTER — Telehealth: Payer: Self-pay | Admitting: Internal Medicine

## 2015-08-15 NOTE — Telephone Encounter (Signed)
Since I see patients constantly during the day for over 10 hrs per day, please ask if they have a specific question(s) that I can answer, as any minutes I spend on the phone means I cannot see the patients as scheduled

## 2015-08-15 NOTE — Telephone Encounter (Signed)
Claiborne Billings called from Dr. Baldemar Lenis office want to let Dr. Jenny Reichmann know that Mr. Loffredo fill disability form with [facility] and they need a peer to peer telephone call between Dr. Jenny Reichmann and Dr. Su Hoff (take 5-10 min). Please give them a call once we get a chance.   Phone # [phone]

## 2015-08-16 ENCOUNTER — Telehealth: Payer: Self-pay | Admitting: Internal Medicine

## 2015-08-16 NOTE — Telephone Encounter (Signed)
Please see previous phone note regarding same, thanks

## 2015-08-16 NOTE — Telephone Encounter (Signed)
Please ask the Physician to forward the questions he is wanting to be answered, as being forced this way in answering on the fly is often time consuming, and having the answers beforehand will cut the time of the phone call

## 2015-08-16 NOTE — Telephone Encounter (Signed)
Called left kelly vm for her to call back

## 2015-08-16 NOTE — Telephone Encounter (Signed)
Dr Royetta Crochet is calling to discuss the patients disability with you. He states that it is not urgent, therefore, i am sending you a phone note. Phone # is attached below.

## 2015-08-17 NOTE — Telephone Encounter (Signed)
Called dr Royetta Crochet and left a vm advising that we need the info he is seeking so that we can be better prepared for this call. If you receive a call from dr Royetta Crochet, please clarify which questions he needs [place] to answer on this patient's disability. Thank you.

## 2015-08-17 NOTE — Telephone Encounter (Signed)
Dr Royetta Crochet was advised by [name] and myself to make an appointment for peer-to-peer after hours today. Dr Royetta Crochet advised that he can be reached any time after 5 pm today at Dr Gwynn Burly convenience at the number listed.

## 2015-08-20 NOTE — Telephone Encounter (Signed)
I left message, then called back by Dr Nolberto Hanlon.  I told him I had not seen the pt since mar 2016, that as far as I knew I was not most directly involved in the disability claim, and informed him pt was seen by Neurology sept 2016, and cont's with severe painful peripheral neuropathy requiring multiple medications.

## 2015-08-20 NOTE — Telephone Encounter (Signed)
I had attempted to contact Dr Lemmie Evens at 515 PM on Friday Jan 6 but no answer.

## 2015-08-28 NOTE — Telephone Encounter (Signed)
Left vm for Kelly to call back again. Closing phone note

## 2015-08-30 ENCOUNTER — Encounter: Payer: Self-pay | Admitting: Neurology

## 2015-08-30 ENCOUNTER — Ambulatory Visit (INDEPENDENT_AMBULATORY_CARE_PROVIDER_SITE_OTHER): Payer: BC Managed Care – PPO | Admitting: Neurology

## 2015-08-30 VITALS — BP 156/80 | HR 68 | Resp 16 | Ht 69.0 in | Wt 236.4 lb

## 2015-08-30 DIAGNOSIS — R269 Unspecified abnormalities of gait and mobility: Secondary | ICD-10-CM | POA: Diagnosis not present

## 2015-08-30 DIAGNOSIS — G610 Guillain-Barre syndrome: Secondary | ICD-10-CM | POA: Diagnosis not present

## 2015-08-30 DIAGNOSIS — G5603 Carpal tunnel syndrome, bilateral upper limbs: Secondary | ICD-10-CM

## 2015-08-30 DIAGNOSIS — G629 Polyneuropathy, unspecified: Secondary | ICD-10-CM | POA: Diagnosis not present

## 2015-08-30 DIAGNOSIS — F341 Dysthymic disorder: Secondary | ICD-10-CM | POA: Diagnosis not present

## 2015-08-30 MED ORDER — CYCLOBENZAPRINE HCL 5 MG PO TABS: 5.0000 mg | ORAL_TABLET | Freq: Every day | ORAL | Status: DC

## 2015-08-30 MED ORDER — PREGABALIN 200 MG PO CAPS: 200.0000 mg | ORAL_CAPSULE | Freq: Three times a day (TID) | ORAL | Status: DC

## 2015-08-30 MED ORDER — TAPENTADOL HCL 75 MG PO TABS: 1.0000 | ORAL_TABLET | Freq: Three times a day (TID) | ORAL | Status: DC | PRN

## 2015-08-30 NOTE — Progress Notes (Signed)
[facility]  PATIENT: Frank [name] DOB: 02/17/1956  _________________________________   HISTORICAL  CHIEF COMPLAINT:  Chief Complaint  Patient presents with  . Pain    Sts. neuropathy is about the same.  Sts. numbness is progressing upwards towards knees.  Sts. depression is improved with Abilify.  He was supposed to stop Robaxin and start an hs dose of Flexeril.  He believes he is still taking Robaxin and never started Flexeril--just misunderstood instructions/fim  . Peripheral Neuropathy    HISTORY OF PRESENT ILLNESS:  Frank [name] is a 60 year old man with a chronic peripheral neuropathy who had superimposed Guillain Barr syndrome in November 2015 with a recurrence one month later. She sees episodes, he has had worsening neuropathic pain.   Chronic neuropathic pain:  Pain is in the feet and up to mid-shin.   Current pain is treated with Lyrica, Duloxetine and Nucynta.  Nucynta helped better than oxycodone.    Of note, his polyneuropathy pain preceded the diagnosis of diabetes by quite a few years.    Since the GBS the dysesthetic pain has increased.    Currently, numbness is in both legs up to his knees and in his hands.    He notes his gait is off balanced but he denies any falls.     He notes weakness in his feet and hands.     Mood:   Depression is better on combination of Cymbalta and Abilify and he tolerates them well.     He would prefer not to be referred to a psychiatrist at this time.   He also takes lorazepam for anxiety.    CTS:   Left hand pain is unchanged.     He has a history of carpal tunnel syndrome and has had surgery in the past. He wears a splint some nights.  In the past, carpal tunnel injections have helped a little bit .    OSA:   In past he was diagnosed with OSA but never started CPAP.   He notes some fatigue and sleepiness.   He falls asleep watching TV and in the evenings.   His mother had narcolepsy.    GBS History:   The initial  episode of GBS was treated with plasmapheresis. He reports that the diagnosis took a couple weeks and he had several emergency room visits. Initially he was sent to a skilled nursing facility. He was reporting a lot of back pain alongside the weakness. A lumbar puncture was eventually done showing high protein which led to the dye diagnosis of GBS.  He had severe weakness at the peak and required intubation for respiratory support. At that time, he was unable to move his legs and could barely move his arms. He improved quite a bit after plasmapheresis and was discharged. However, a couple days after discharge he began to feel weak again and return to the emergency room. He was recently admitted for recurrent GBS. For that hospital stay he did not require intubation but did have a facemask oxygen. He also was treated with IVIG the second time around. He improved and was discharged home. He did outpatient physical therapy. He has had a fairly good in movement of his strength and is able to walk independently. However, he has had more dysesthetic pain.   With the exception of more pain, he otherwise feels very close to his pre-GBS baseline.   Of note, he had a flu shot a few weeks before the first episode of GBS.  MRI's from 06/2014 and 07/2014:    Cervical MRI shows spinal stenosis at C4-C5, C5-C6 and C6-C7. There is no spinal cord compression. Thoracic spine shows T8-T9 disc extrusion to the left that does not cause spinal cord compression. There are mild degenerative changes in the lumbar spine. Contrast was not used.   I also reviewed many of the notes and labs from his hospital stay. CSF Protein was greatly elevated at 118 and the CSF white blood cell count was 1.    REVIEW OF SYSTEMS: Constitutional: No fevers, chills, sweats, or change in appetite Eyes: No visual changes, double vision, eye pain Ear, nose and throat: No hearing loss, ear pain, nasal congestion, sore throat Cardiovascular: No chest  pain, palpitations Respiratory: No shortness of breath at rest or with exertion.   No wheezes GastrointestinaI: No nausea, vomiting, diarrhea, abdominal pain, fecal incontinence Genitourinary: No dysuria, urinary retention or frequency.  No nocturia. Musculoskeletal: No neck pain, back pain Integumentary: No rash, pruritus, skin lesions Neurological: as above Psychiatric: Anxiety and depression at this time.   ADD is stable. Endocrine: No palpitations, diaphoresis, change in appetite, change in weigh or increased thirst Hematologic/Lymphatic: No anemia, purpura, petechiae. Allergic/Immunologic: No itchy/runny eyes, nasal congestion, recent allergic reactions, rashes  ALLERGIES: Allergies  Allergen Reactions  . Codeine Itching    REACTION: Itching  . Metformin And Related Other (See Comments)    GI upset, diarrhea  . Shellfish Allergy     HOME MEDICATIONS:  Current outpatient prescriptions:  .  allopurinol (ZYLOPRIM) 300 MG tablet, Take 1 tablet (300 mg total) by mouth daily., Disp: 30 tablet, Rfl: 11 .  amLODipine (NORVASC) 10 MG tablet, Take 1 tablet (10 mg total) by mouth daily., Disp: 90 tablet, Rfl: 3 .  ARIPiprazole (ABILIFY) 5 MG tablet, Take 1 tablet (5 mg total) by mouth daily., Disp: 30 tablet, Rfl: 11 .  aspirin EC 81 MG tablet, Take 1 tablet (81 mg total) by mouth daily., Disp: , Rfl:  .  DULoxetine (CYMBALTA) 30 MG capsule, Take 1 capsule (30 mg total) by mouth 3 (three) times daily., Disp: 270 capsule, Rfl: 3 .  famotidine (PEPCID) 40 MG tablet, Take 1 tablet (40 mg total) by mouth 2 (two) times daily., Disp: 60 tablet, Rfl: 11 .  glipiZIDE (GLUCOTROL) 10 MG tablet, Take 10 mg by mouth daily before breakfast., Disp: , Rfl:  .  LORazepam (ATIVAN) 0.5 MG tablet, TAKE 1 TABLET BY MOUTH TWICE DAILY, Disp: 60 tablet, Rfl: 2 .  methylphenidate 36 MG PO CR tablet, Take 1 tablet (36 mg total) by mouth daily., Disp: 30 tablet, Rfl: 0 .  pantoprazole (PROTONIX) 40 MG tablet,  Take 1 tablet (40 mg total) by mouth daily., Disp: 30 tablet, Rfl: 11 .  pregabalin (LYRICA) 200 MG capsule, Take 1 capsule (200 mg total) by mouth 3 (three) times daily., Disp: 270 capsule, Rfl: 1 .  Tapentadol HCl 75 MG TABS, Take 1 tablet (75 mg total) by mouth 3 (three) times daily as needed., Disp: 90 tablet, Rfl: 0 .  cyclobenzaprine (FLEXERIL) 5 MG tablet, Take 1 tablet (5 mg total) by mouth at bedtime. (Patient not taking: Reported on 08/30/2015), Disp: 30 tablet, Rfl: 11 .  glipiZIDE (GLUCOTROL XL) 2.5 MG 24 hr tablet, TAKE 1 TABLET BY MOUTH DAILY WITH BREAKFAST (Patient not taking: Reported on 08/30/2015), Disp: 90 tablet, Rfl: 1 .  glucose blood (FREESTYLE TEST STRIPS) test strip, Use as instructed (Patient not taking: Reported on 08/30/2015), Disp: 100 each, Rfl:  12 .  hydrochlorothiazide (MICROZIDE) 12.5 MG capsule, Take 1 capsule (12.5 mg total) by mouth daily., Disp: 90 capsule, Rfl: 3 .  polyethylene glycol (MIRALAX / GLYCOLAX) packet, Take 17 g by mouth daily. (Patient not taking: Reported on 11/15/2014), Disp: 30 each, Rfl: 0  PAST MEDICAL HISTORY: Past Medical History  Diagnosis Date  . COLONIC POLYPS 02/01/2008  . DIABETES MELLITUS, TYPE II 05/20/2010  . HYPERLIPIDEMIA 10/09/2008  . ANXIETY DEPRESSION 02/01/2008  . ERECTILE DYSFUNCTION 10/09/2008  . ADD 10/09/2008  . SLEEP APNEA, OBSTRUCTIVE 02/01/2008  . MORTON'S NEUROMA 05/20/2010  . PERIPHERAL NEUROPATHY 05/20/2010  . Other specified forms of hearing loss 06/27/2009  . HYPERTENSION 10/09/2008  . HEMORRHOIDS 02/01/2008  . ALLERGIC RHINITIS 10/09/2008  . Stricture and stenosis of esophagus 02/02/2008  . GERD 02/01/2008  . HIATAL HERNIA 02/01/2008  . ERECTILE DYSFUNCTION, ORGANIC 05/20/2010  . WRIST PAIN, LEFT 12/05/2009  . FOOT PAIN, LEFT 05/20/2010  . PERIPHERAL EDEMA 05/20/2010  . DYSPNEA 03/12/2010  . Abdominal pain, unspecified site 01/19/2009  . Abdominal pain, left lower quadrant 06/06/2010  . Type II or unspecified type diabetes  mellitus without mention of complication, uncontrolled 11/14/2010  . Guillain Barr syndrome [facility])     PAST SURGICAL HISTORY: Past Surgical History  Procedure Laterality Date  . Carpal tunnel release    . Rotator cuff repair    . Esophageal dilation  july 2009  . Esophagogastroduodenoscopy N/A 06/27/2014    Procedure: ESOPHAGOGASTRODUODENOSCOPY (EGD);  Surgeon: Lafayette Dragon, MD;  Location: [facility];  Service: Endoscopy;  Laterality: N/A;  . Eye surgery      catract surgery on both eyes    FAMILY HISTORY: Family History  Problem Relation Age of Onset  . Diabetes Mother   . Heart disease Mother   . Hyperlipidemia Mother   . Depression Mother   . Diabetes Brother   . Colon cancer Neg Hx     SOCIAL HISTORY:  Social History   Social History  . Marital Status: Single    Spouse Name: N/A  . Number of Children: N/A  . 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 Smoking status: Never Smoker   . Smokeless tobacco: Never Used  . Alcohol Use: Yes     Comment: occasional  . Drug Use: Yes    Special: Methaqualone  . Sexual Activity: Yes     Comment: before I got sick   Other Topics Concern  . Not on file   Social History Narrative     PHYSICAL EXAM  Filed Vitals:   08/30/15 1115  BP: 156/80  Pulse: 68  Resp: 16  Height: '5\' 9"'  (1.753 m)  Weight: 236 lb 6.4 oz (107.23 kg)    Body mass index is 34.89 kg/(m^2).   General: The patient is well-developed and well-nourished and in no acute distress  Skin: Extremities are without rash. He has mild pedal edema, slightly worse on the left.    Neurologic Exam  Mental status: The patient is alert and oriented x 3 at the time of the examination. The patient has apparent normal recent and remote memory, with an apparently normal attention span and concentration ability.   Speech is normal.  Cranial nerves: Extraocular movements are full. Pupils are equal,  round, and reactive to light and accomodation.  Visual fields are full.  Facial symmetry is present. There is good facial sensation to soft touch bilaterally.Facial strength is  normal.  Trapezius and sternocleidomastoid strength is normal. No dysarthria is noted.  The tongue is midline, and the patient has symmetric elevation of the soft palate. No obvious hearing deficits are noted.  Motor:  Muscle bulk is normal.   Tone is normal. Strength is  5 / 5 proximally in all 4 extremities but 4+/5 in left APB, 4+ in bilateral interossei hand muscles (better on left), 4/5 in both EHL muscles  Sensory: Sensory testing is intact in hands and proximal legs.   Decreased vibration at ankles, absent at toes, reduced touch/temp in feet and lower legs up to mid shin.  Coordination: Cerebellar testing reveals good finger-nose-finger and reduced heel-to-shin bilaterally.  Gait and station: Station is normal.   Gait is normal. Tandem gait is wide. Romberg is positive.   Reflexes: Deep tendon reflexes are symmetric and 1+ bilaterally at deltoid and trace at knees, absent at ankles.        DIAGNOSTIC DATA (LABS, IMAGING, TESTING) - I reviewed patient records, labs, notes, testing and imaging myself where available.  Lab Results  Component Value Date   WBC 6.9 08/09/2014   HGB 13.6 08/09/2014   HCT 39.5 08/09/2014   MCV 89.0 08/09/2014   PLT 271 08/09/2014      Component Value Date/Time   NA 138 08/29/2014 1105   K 4.2 08/29/2014 1105   CL 102 08/29/2014 1105   CO2 32 08/29/2014 1105   GLUCOSE 106* 08/29/2014 1105   BUN 12 08/29/2014 1105   CREATININE 0.81 08/29/2014 1105   CALCIUM 9.2 08/29/2014 1105   PROT 6.7 08/29/2014 1105   ALBUMIN 3.4* 08/29/2014 1105   AST 20 08/29/2014 1105   ALT 20 08/29/2014 1105   ALKPHOS 78 08/29/2014 1105   BILITOT 0.3 08/29/2014 1105   GFRNONAA >90 08/06/2014 0240   GFRAA >90 08/06/2014 0240   Lab Results  Component Value Date   CHOL 205* 08/29/2014   HDL  47.20 08/29/2014   LDLCALC 130* 05/04/2014   LDLDIRECT 111.0 08/29/2014   TRIG 344.0* 08/29/2014   CHOLHDL 4 08/29/2014   Lab Results  Component Value Date   HGBA1C 5.8 08/29/2014   No results found for: VITAMINB12 Lab Results  Component Value Date   TSH 5.180* 06/22/2014       ASSESSMENT AND PLAN  Polyneuropathy ([place]) - Plan: Multiple Myeloma Panel (SPEP&IFE w/QIG), Sedimentation rate, ANA w/Reflex, Vitamin B12  Bilateral carpal tunnel syndrome  Recurrent GBS (Guillain-Barre syndrome) (HCC)  Gait disorder  ANXIETY DEPRESSION    1.   Continue Nucynta and Lyrica for pain 2.   Continue Cymbalta for pain and mood.  Continue Abilify 3.    Numbness is worse --- though likely chronic neuropathy and residual from GBS, will check labs  4.  If sleepiness worsens, we will do split night --- he is not sure he can use CPAP 5.   He will return to see me in 4-5 months or sooner if he has new or worsening neurologic symptoms.   If numbness or gait worsens, repeat NCV/EMG   Richard A. [name], MD, PhD 02/07/4764, 46:50 AM Certified in Neurology, Clinical Neurophysiology, Sleep Medicine, Pain Medicine and Neuroimaging  [facility] [address] [place], [place] [address] [phone]

## 2015-09-03 LAB — MULTIPLE MYELOMA PANEL, SERUM
Albumin SerPl Elph-Mcnc: 3.9 g/dL (ref 2.9–4.4)
Albumin/Glob SerPl: 1.3 (ref 0.7–1.7)
Alpha 1: 0.1 g/dL (ref 0.0–0.4)
Alpha2 Glob SerPl Elph-Mcnc: 0.6 g/dL (ref 0.4–1.0)
B-GLOBULIN SERPL ELPH-MCNC: 1.3 g/dL (ref 0.7–1.3)
GAMMA GLOB SERPL ELPH-MCNC: 1.1 g/dL (ref 0.4–1.8)
Globulin, Total: 3.1 g/dL (ref 2.2–3.9)
IgA/Immunoglobulin A, Serum: 178 mg/dL (ref 61–437)
IgG (Immunoglobin G), Serum: 1143 mg/dL (ref 700–1600)
IgM (Immunoglobulin M), Srm: 69 mg/dL (ref 20–172)
Total Protein: 7 g/dL (ref 6.0–8.5)

## 2015-09-03 LAB — ANA W/REFLEX: ANA: NEGATIVE

## 2015-09-03 LAB — VITAMIN B12: VITAMIN B 12: 316 pg/mL (ref 211–946)

## 2015-09-03 LAB — SEDIMENTATION RATE: SED RATE: 6 mm/h (ref 0–30)

## 2015-09-04 ENCOUNTER — Telehealth: Payer: Self-pay | Admitting: *Deleted

## 2015-09-04 ENCOUNTER — Telehealth: Payer: Self-pay | Admitting: Neurology

## 2015-09-04 NOTE — Telephone Encounter (Signed)
Pt said Faith left msg on VM for reg labs from last wk

## 2015-09-04 NOTE — Telephone Encounter (Signed)
-----   Message from Britt Bottom, MD sent at 09/03/2015  5:54 PM EST ----- Please note that the lab work looks good.

## 2015-09-04 NOTE — Telephone Encounter (Signed)
LMTC./fim 

## 2015-09-04 NOTE — Telephone Encounter (Signed)
I have spoken with Frank [name] this morning, and per RAS, advised that labwork looks normal; if condition continues to worsen, will consider emg/ncv.  He verbalized understanding of same/fim

## 2015-09-04 NOTE — Telephone Encounter (Signed)
Ihave spoken with Frank [name] this morning, and per RAS, advised that labs were normal--if condition continues to worsen, will do ncv/emg.  He verbalized understanding of same/fim

## 2015-09-04 NOTE — Telephone Encounter (Signed)
-----   Message from Britt Bottom, MD sent at 09/03/2015  5:55 PM EST ----- Please note that the lab work looks normal. If he continues to worsen, we will need to do another NCV/EMG.

## 2015-09-04 NOTE — Telephone Encounter (Signed)
Pt requesting refill on his methylphenidate 36mg ...Frank [name]

## 2015-09-05 MED ORDER — METHYLPHENIDATE HCL ER 36 MG PO TB24: 36.0000 mg | ORAL_TABLET | Freq: Every day | ORAL | Status: DC

## 2015-09-05 NOTE — Telephone Encounter (Signed)
Done hardcopy to [facility]  Please remind pt due for ROV mar 2017

## 2015-09-05 NOTE — Telephone Encounter (Signed)
Called pt no answer LMOM rx ready for pick-up.../lmb 

## 2015-09-11 ENCOUNTER — Other Ambulatory Visit: Payer: Self-pay | Admitting: Internal Medicine

## 2015-09-11 NOTE — Telephone Encounter (Signed)
Please advise, patient needs refill on xanax 

## 2015-09-11 NOTE — Telephone Encounter (Signed)
Done hardcopy to Corinne  

## 2015-09-12 NOTE — Telephone Encounter (Signed)
Medication printed signed and faxed to pharmacy  

## 2015-10-04 ENCOUNTER — Other Ambulatory Visit: Payer: Self-pay | Admitting: Internal Medicine

## 2015-10-14 ENCOUNTER — Other Ambulatory Visit: Payer: Self-pay | Admitting: Internal Medicine

## 2015-11-06 ENCOUNTER — Other Ambulatory Visit: Payer: Self-pay | Admitting: Internal Medicine

## 2015-11-08 ENCOUNTER — Encounter: Payer: Self-pay | Admitting: Gastroenterology

## 2015-11-11 ENCOUNTER — Other Ambulatory Visit: Payer: Self-pay | Admitting: Internal Medicine

## 2015-11-14 ENCOUNTER — Telehealth: Payer: Self-pay | Admitting: *Deleted

## 2015-11-14 MED ORDER — METHYLPHENIDATE HCL ER 36 MG PO TB24: 36.0000 mg | ORAL_TABLET | Freq: Every day | ORAL | Status: DC

## 2015-11-14 NOTE — Telephone Encounter (Signed)
Done hardcopy to Corinne  

## 2015-11-14 NOTE — Telephone Encounter (Signed)
Received call pt requesting refill on his generic concerta...Frank [name]

## 2015-11-14 NOTE — Telephone Encounter (Signed)
Left message for patient, prescription in front office for pick up

## 2015-12-11 ENCOUNTER — Other Ambulatory Visit: Payer: Self-pay | Admitting: Internal Medicine

## 2015-12-13 ENCOUNTER — Other Ambulatory Visit: Payer: Self-pay | Admitting: Internal Medicine

## 2015-12-13 NOTE — Telephone Encounter (Signed)
Robaxin done erx  Pt due for ROV please

## 2015-12-13 NOTE — Telephone Encounter (Signed)
Please advise is this ok to refill? °

## 2016-01-12 ENCOUNTER — Other Ambulatory Visit: Payer: Self-pay | Admitting: Internal Medicine

## 2016-01-13 ENCOUNTER — Other Ambulatory Visit: Payer: Self-pay | Admitting: Internal Medicine

## 2016-01-29 ENCOUNTER — Telehealth: Payer: Self-pay | Admitting: Neurology

## 2016-01-29 ENCOUNTER — Other Ambulatory Visit: Payer: Self-pay

## 2016-01-29 MED ORDER — TAPENTADOL HCL 75 MG PO TABS: 1.0000 | ORAL_TABLET | Freq: Three times a day (TID) | ORAL | Status: DC | PRN

## 2016-01-29 NOTE — Telephone Encounter (Signed)
Nucynta rx. up front [facility]/fim 

## 2016-01-29 NOTE — Telephone Encounter (Signed)
Patient called to request refill of Tapentadol HCl 75 MG TABS

## 2016-01-29 NOTE — Telephone Encounter (Signed)
Pt lm on triage rq rf for methylphenidate.  Pended for review.  pls advise.

## 2016-01-29 NOTE — Telephone Encounter (Signed)
Rx. awaiting RAS sig/fim 

## 2016-01-30 NOTE — Telephone Encounter (Signed)
Ok to contact pt, I am unable to refill the concerta as pt was seen last on mar 2016, which is not past 15 months

## 2016-01-31 NOTE — Telephone Encounter (Signed)
Left message to give [place] a call back will continue to call

## 2016-02-01 NOTE — Telephone Encounter (Signed)
2nd attempt to call patient, unable to reach left message to give [place] a call back

## 2016-02-07 ENCOUNTER — Ambulatory Visit: Payer: BLUE CROSS/BLUE SHIELD | Admitting: Internal Medicine

## 2016-02-11 ENCOUNTER — Other Ambulatory Visit: Payer: Self-pay | Admitting: Internal Medicine

## 2016-02-20 ENCOUNTER — Ambulatory Visit (INDEPENDENT_AMBULATORY_CARE_PROVIDER_SITE_OTHER): Payer: BC Managed Care – PPO | Admitting: Internal Medicine

## 2016-02-20 ENCOUNTER — Other Ambulatory Visit (INDEPENDENT_AMBULATORY_CARE_PROVIDER_SITE_OTHER): Payer: BC Managed Care – PPO

## 2016-02-20 ENCOUNTER — Encounter: Payer: Self-pay | Admitting: Internal Medicine

## 2016-02-20 VITALS — BP 142/82 | HR 93 | Temp 98.4°F | Resp 20 | Wt 236.0 lb

## 2016-02-20 DIAGNOSIS — R6889 Other general symptoms and signs: Secondary | ICD-10-CM | POA: Diagnosis not present

## 2016-02-20 DIAGNOSIS — IMO0002 Reserved for concepts with insufficient information to code with codable children: Secondary | ICD-10-CM

## 2016-02-20 DIAGNOSIS — Z0000 Encounter for general adult medical examination without abnormal findings: Secondary | ICD-10-CM | POA: Insufficient documentation

## 2016-02-20 DIAGNOSIS — Z1159 Encounter for screening for other viral diseases: Secondary | ICD-10-CM | POA: Diagnosis not present

## 2016-02-20 DIAGNOSIS — E1165 Type 2 diabetes mellitus with hyperglycemia: Secondary | ICD-10-CM

## 2016-02-20 DIAGNOSIS — E785 Hyperlipidemia, unspecified: Secondary | ICD-10-CM | POA: Diagnosis not present

## 2016-02-20 DIAGNOSIS — I10 Essential (primary) hypertension: Secondary | ICD-10-CM | POA: Diagnosis not present

## 2016-02-20 DIAGNOSIS — G4733 Obstructive sleep apnea (adult) (pediatric): Secondary | ICD-10-CM

## 2016-02-20 DIAGNOSIS — E1142 Type 2 diabetes mellitus with diabetic polyneuropathy: Secondary | ICD-10-CM

## 2016-02-20 DIAGNOSIS — F909 Attention-deficit hyperactivity disorder, unspecified type: Secondary | ICD-10-CM

## 2016-02-20 DIAGNOSIS — Z0001 Encounter for general adult medical examination with abnormal findings: Secondary | ICD-10-CM | POA: Insufficient documentation

## 2016-02-20 DIAGNOSIS — F988 Other specified behavioral and emotional disorders with onset usually occurring in childhood and adolescence: Secondary | ICD-10-CM

## 2016-02-20 LAB — URINALYSIS, ROUTINE W REFLEX MICROSCOPIC
BILIRUBIN URINE: NEGATIVE
HGB URINE DIPSTICK: NEGATIVE
Ketones, ur: NEGATIVE
LEUKOCYTES UA: NEGATIVE
NITRITE: NEGATIVE
RBC / HPF: NONE SEEN (ref 0–?)
Specific Gravity, Urine: 1.025 (ref 1.000–1.030)
Urobilinogen, UA: 0.2 (ref 0.0–1.0)
WBC, UA: NONE SEEN (ref 0–?)
pH: 5.5 (ref 5.0–8.0)

## 2016-02-20 LAB — HEPATIC FUNCTION PANEL
ALBUMIN: 4.4 g/dL (ref 3.5–5.2)
ALT: 50 U/L (ref 0–53)
AST: 30 U/L (ref 0–37)
Alkaline Phosphatase: 106 U/L (ref 39–117)
BILIRUBIN DIRECT: 0.1 mg/dL (ref 0.0–0.3)
TOTAL PROTEIN: 7.8 g/dL (ref 6.0–8.3)
Total Bilirubin: 0.6 mg/dL (ref 0.2–1.2)

## 2016-02-20 LAB — BASIC METABOLIC PANEL
BUN: 16 mg/dL (ref 6–23)
CO2: 27 mEq/L (ref 19–32)
Calcium: 9.6 mg/dL (ref 8.4–10.5)
Chloride: 99 mEq/L (ref 96–112)
Creatinine, Ser: 1.12 mg/dL (ref 0.40–1.50)
GFR: 70.49 mL/min (ref >60.00)
Glucose, Bld: 305 mg/dL — ABNORMAL HIGH (ref 70–99)
POTASSIUM: 4.6 meq/L (ref 3.5–5.1)
Sodium: 137 mEq/L (ref 135–145)

## 2016-02-20 LAB — LIPID PANEL
Cholesterol: 240 mg/dL — ABNORMAL HIGH (ref 0–200)
HDL: 45.1 mg/dL (ref >39.00)
NONHDL: 194.42
Total CHOL/HDL Ratio: 5
Triglycerides: 343 mg/dL — ABNORMAL HIGH (ref 0.0–149.0)
VLDL: 68.6 mg/dL — AB (ref 0.0–40.0)

## 2016-02-20 LAB — MICROALBUMIN / CREATININE URINE RATIO
Creatinine,U: 166.8 mg/dL
Microalb Creat Ratio: 3.1 mg/g (ref 0.0–30.0)
Microalb, Ur: 5.2 mg/dL — ABNORMAL HIGH (ref 0.0–1.9)

## 2016-02-20 LAB — CBC WITH DIFFERENTIAL/PLATELET
Basophils Absolute: 0 10*3/uL (ref 0.0–0.1)
Basophils Relative: 0.5 % (ref 0.0–3.0)
EOS ABS: 0.2 10*3/uL (ref 0.0–0.7)
EOS PCT: 2.2 % (ref 0.0–5.0)
HCT: 44.9 % (ref 39.0–52.0)
HEMOGLOBIN: 15.1 g/dL (ref 13.0–17.0)
Lymphocytes Relative: 35.5 % (ref 12.0–46.0)
Lymphs Abs: 3.1 10*3/uL (ref 0.7–4.0)
MCHC: 33.7 g/dL (ref 30.0–36.0)
MCV: 85.5 fl (ref 78.0–100.0)
MONO ABS: 0.7 10*3/uL (ref 0.1–1.0)
Monocytes Relative: 7.6 % (ref 3.0–12.0)
Neutro Abs: 4.8 10*3/uL (ref 1.4–7.7)
Neutrophils Relative %: 54.2 % (ref 43.0–77.0)
Platelets: 208 10*3/uL (ref 150.0–400.0)
RBC: 5.24 Mil/uL (ref 4.22–5.81)
RDW: 15.1 % (ref 11.5–15.5)
WBC: 8.8 10*3/uL (ref 4.0–10.5)

## 2016-02-20 LAB — LDL CHOLESTEROL, DIRECT: Direct LDL: 148 mg/dL

## 2016-02-20 LAB — TSH: TSH: 1.63 u[IU]/mL (ref 0.35–4.50)

## 2016-02-20 LAB — PSA: PSA: 0.32 ng/mL (ref 0.10–4.00)

## 2016-02-20 LAB — HEMOGLOBIN A1C: Hgb A1c MFr Bld: 11.4 % — ABNORMAL HIGH (ref 4.6–6.5)

## 2016-02-20 MED ORDER — ROSUVASTATIN CALCIUM 10 MG PO TABS: 10.0000 mg | ORAL_TABLET | Freq: Every day | ORAL | Status: DC

## 2016-02-20 MED ORDER — METHYLPHENIDATE HCL ER (OSM) 54 MG PO TBCR: 54.0000 mg | EXTENDED_RELEASE_TABLET | ORAL | Status: DC

## 2016-02-20 NOTE — Progress Notes (Signed)
Subjective:    Patient ID: Frank [name], male    DOB: [birth year], 60 y.o.   MRN: [id number]  HPI  Here for wellness and f/u;  Overall doing ok;  Pt denies Chest pain, worsening SOB, DOE, wheezing, orthopnea, PND, worsening LE edema, palpitations, dizziness or syncope.  Pt denies neurological change such as new headache, facial or extremity weakness.  Pt denies polydipsia, polyuria, or low sugar symptoms. Pt states overall good compliance with treatment and medications, good tolerability, and has been trying to follow appropriate diet.  Pt denies worsening depressive symptoms, suicidal ideation or panic. No fever, night sweats, wt loss, loss of appetite, or other constitutional symptoms.  Pt states good ability with ADL's, has low fall risk, home safety reviewed and adequate, no other significant changes in hearing or vision. Sedentary, and unfortuantely has gained 30 lbs in the past 18 mo  Wt Readings from Last 3 Encounters:  02/20/16 236 lb (107.049 kg)  08/30/15 236 lb 6.4 oz (107.23 kg)  04/30/15 229 lb (103.874 kg)   Does c/o ongoing fatigue, but denies signficant daytime hypersomnolence.  Has some increased difficultty with control of ADD symptoms since concerta decreased to once daily.   Pt denies polydipsia, polyuria, or low sugar symptoms such as weakness or confusion improved with po intake.  Pt states overall fair compliance with meds,   Not using CPAP and no recent f/u osa in several yrs Past Medical History  Diagnosis Date  . COLONIC POLYPS 02/01/2008  . DIABETES MELLITUS, TYPE II 05/20/2010  . HYPERLIPIDEMIA 10/09/2008  . ANXIETY DEPRESSION 02/01/2008  . ERECTILE DYSFUNCTION 10/09/2008  . ADD 10/09/2008  . SLEEP APNEA, OBSTRUCTIVE 02/01/2008  . MORTON'S NEUROMA 05/20/2010  . PERIPHERAL NEUROPATHY 05/20/2010  . Other specified forms of hearing loss 06/27/2009  . HYPERTENSION 10/09/2008  . HEMORRHOIDS 02/01/2008  . ALLERGIC RHINITIS 10/09/2008  . Stricture and stenosis of esophagus  02/02/2008  . GERD 02/01/2008  . HIATAL HERNIA 02/01/2008  . ERECTILE DYSFUNCTION, ORGANIC 05/20/2010  . WRIST PAIN, LEFT 12/05/2009  . FOOT PAIN, LEFT 05/20/2010  . PERIPHERAL EDEMA 05/20/2010  . DYSPNEA 03/12/2010  . Abdominal pain, unspecified site 01/19/2009  . Abdominal pain, left lower quadrant 06/06/2010  . Type II or unspecified type diabetes mellitus without mention of complication, uncontrolled 11/14/2010  . Guillain Barr syndrome [facility])    Past Surgical History  Procedure Laterality Date  . Carpal tunnel release    . Rotator cuff repair    . Esophageal dilation  july 2009  . Esophagogastroduodenoscopy N/A 06/27/2014    Procedure: ESOPHAGOGASTRODUODENOSCOPY (EGD);  Surgeon: Lafayette Dragon, MD;  Location: [facility];  Service: Endoscopy;  Laterality: N/A;  . Eye surgery      catract surgery on both eyes    reports that he has never smoked. He has never used smokeless tobacco. He reports that he drinks alcohol. He reports that he uses illicit drugs (Methaqualone). family history includes Depression in his mother; Diabetes in his brother and mother; Heart disease in his mother; Hyperlipidemia in his mother. There is no history of Colon cancer. Allergies  Allergen Reactions  . Codeine Itching    REACTION: Itching  . Metformin And Related Other (See Comments)    GI upset, diarrhea  . Shellfish Allergy    Current Outpatient Prescriptions on File Prior to Visit  Medication Sig Dispense Refill  . allopurinol (ZYLOPRIM) 300 MG tablet Take 1 tablet (300 mg total) by mouth daily. 30 tablet 11  . amLODipine (  NORVASC) 10 MG tablet Take 1 tablet (10 mg total) by mouth daily. 90 tablet 3  . aspirin EC 81 MG tablet Take 1 tablet (81 mg total) by mouth daily.    . DULoxetine (CYMBALTA) 30 MG capsule Take 1 capsule (30 mg total) by mouth 3 (three) times daily. 270 capsule 3  . glipiZIDE (GLUCOTROL) 10 MG tablet Take 10 mg by mouth daily before breakfast.    . hydrochlorothiazide (MICROZIDE)  12.5 MG capsule TAKE ONE CAPSULE BY MOUTH EVERY DAY 90 capsule 0  . methocarbamol (ROBAXIN) 500 MG tablet TAKE 1 TABLET(500 MG) BY MOUTH EVERY 8 HOURS AS NEEDED FOR MUSCLE SPASMS 90 tablet 0  . pantoprazole (PROTONIX) 40 MG tablet Take 1 tablet (40 mg total) by mouth daily. 30 tablet 11  . Tapentadol HCl 75 MG TABS Take 1 tablet (75 mg total) by mouth 3 (three) times daily as needed. 90 tablet 0   No current facility-administered medications on file prior to visit.    Review of Systems Constitutional: Negative for increased diaphoresis, or other activity, appetite or siginficant weight change other than noted HENT: Negative for worsening hearing loss, ear pain, facial swelling, mouth sores and neck stiffness.   Eyes: Negative for other worsening pain, redness or visual disturbance.  Respiratory: Negative for choking or stridor Cardiovascular: Negative for other chest pain and palpitations.  Gastrointestinal: Negative for worsening diarrhea, blood in stool, or abdominal distention Genitourinary: Negative for hematuria, flank pain or change in urine volume.  Musculoskeletal: Negative for myalgias or other joint complaints.  Skin: Negative for other color change and wound or drainage.  Neurological: Negative for syncope and numbness. other than noted Hematological: Negative for adenopathy. or other swelling Psychiatric/Behavioral: Negative for hallucinations, SI, self-injury, decreased concentration or other worsening agitation.      Objective:   Physical Exam VS noted,  Constitutional: Pt is oriented to person, place, and time. Appears well-developed and well-nourished, in no significant distress Head: Normocephalic and atraumatic  Eyes: Conjunctivae and EOM are normal. Pupils are equal, round, and reactive to light Right Ear: External ear normal.  Left Ear: External ear normal Nose: Nose normal.  Mouth/Throat: Oropharynx is clear and moist  Neck: Normal range of motion. Neck supple.  No JVD present. No tracheal deviation present or significant neck LA or mass Cardiovascular: Normal rate, regular rhythm, normal heart sounds and intact distal pulses.   Pulmonary/Chest: Effort normal and breath sounds without rales or wheezing  Abdominal: Soft. Bowel sounds are normal. NT. No HSM  Musculoskeletal: Normal range of motion. Exhibits no edema Lymphadenopathy: Has no cervical adenopathy.  Neurological: Pt is alert and oriented to person, place, and time. Pt has normal reflexes. No cranial nerve deficit. Motor grossly intact Skin: Skin is warm and dry. No rash noted or new ulcers Psychiatric:  Has normal mood and affect. Behavior is normal.   Lab Results  Component Value Date   WBC 6.9 08/09/2014   HGB 13.6 08/09/2014   HCT 39.5 08/09/2014   PLT 271 08/09/2014   GLUCOSE 106* 08/29/2014   CHOL 205* 08/29/2014   TRIG 344.0* 08/29/2014   HDL 47.20 08/29/2014   LDLDIRECT 111.0 08/29/2014   LDLCALC 130* 05/04/2014   ALT 20 08/29/2014   AST 20 08/29/2014   NA 138 08/29/2014   K 4.2 08/29/2014   CL 102 08/29/2014   CREATININE 0.81 08/29/2014   BUN 12 08/29/2014   CO2 32 08/29/2014   TSH 5.180* 06/22/2014   PSA 0.99 05/04/2014  HGBA1C 5.8 08/29/2014   MICROALBUR 0.5 07/16/2012        Assessment & Plan:

## 2016-02-20 NOTE — Patient Instructions (Signed)
OK to increase the concerta to 54 mg per day  Please take all new medication as prescribed  - the crestor for high cholesterol  Please continue all other medications as before, and refills have been done if requested.  Please have the pharmacy call with any other refills you may need.  Please continue your efforts at being more active, low cholesterol diet, and weight control.  You are otherwise up to date with prevention measures today.  Please keep your appointments with your specialists as you may have planned  You will be contacted regarding the referral for: Dr Annamaria Boots - pulmonary for sleep apnea  Please go to the LAB in the Basement (turn left off the elevator) for the tests to be done today  You will be contacted by phone if any changes need to be made immediately.  Otherwise, you will receive a letter about your results with an explanation, but please check with MyChart first.  Please remember to sign up for MyChart if you have not done so, as this will be important to you in the future with finding out test results, communicating by private email, and scheduling acute appointments online when needed.  Please return in 6 months, or sooner if needed, with Lab testing done 3-5 days before

## 2016-02-20 NOTE — Progress Notes (Signed)
Pre visit review using our clinic review tool, if applicable. No additional management support is needed unless otherwise documented below in the visit note. 

## 2016-02-21 ENCOUNTER — Encounter: Payer: Self-pay | Admitting: Internal Medicine

## 2016-02-21 ENCOUNTER — Other Ambulatory Visit: Payer: Self-pay | Admitting: Internal Medicine

## 2016-02-21 MED ORDER — DULAGLUTIDE 0.75 MG/0.5ML ~~LOC~~ SOAJ: SUBCUTANEOUS | Status: DC

## 2016-02-21 MED ORDER — LINAGLIPTIN 5 MG PO TABS: 5.0000 mg | ORAL_TABLET | Freq: Every day | ORAL | Status: DC

## 2016-02-21 MED ORDER — ROSUVASTATIN CALCIUM 40 MG PO TABS: 40.0000 mg | ORAL_TABLET | Freq: Every day | ORAL | Status: DC

## 2016-02-25 ENCOUNTER — Other Ambulatory Visit: Payer: Self-pay | Admitting: Internal Medicine

## 2016-02-25 ENCOUNTER — Other Ambulatory Visit: Payer: Self-pay | Admitting: Neurology

## 2016-02-26 ENCOUNTER — Telehealth: Payer: Self-pay

## 2016-02-26 NOTE — Telephone Encounter (Signed)
Please advise 

## 2016-02-26 NOTE — Telephone Encounter (Signed)
PA initiated via CoverMyMeds key [id number]

## 2016-02-26 NOTE — Telephone Encounter (Signed)
Done hardcopy to Corinne  

## 2016-02-26 NOTE — Telephone Encounter (Signed)
Medication refill sent to pharmacy  

## 2016-02-28 ENCOUNTER — Ambulatory Visit: Payer: BC Managed Care – PPO

## 2016-02-28 ENCOUNTER — Telehealth: Payer: Self-pay

## 2016-02-28 DIAGNOSIS — E1142 Type 2 diabetes mellitus with diabetic polyneuropathy: Secondary | ICD-10-CM

## 2016-02-28 NOTE — Assessment & Plan Note (Signed)
?   Control, stable overall by history and exam, and pt to continue medical treatment as before,  to f/u any worsening symptoms or concerns  

## 2016-02-28 NOTE — Telephone Encounter (Signed)
Patient would like to be referred to the diabetes center

## 2016-02-28 NOTE — Assessment & Plan Note (Signed)
Unocntrolled, to start crestor 10 mg. Lower chol/DM diet,  to f/u any worsening symptoms or concerns

## 2016-02-28 NOTE — Assessment & Plan Note (Addendum)
Ok to cont once daily but higher dose concerta,  to f/u any worsening symptoms or concerns  In addition to the time spent performing CPE, I spent an additional 25 minutes face to face,in which greater than 50% of this time was spent in counseling and coordination of care for patient's acute illness as documented.

## 2016-02-28 NOTE — Progress Notes (Signed)
Taught patient correct way to use Trulicity Pen for diabetes control. Patient gave return demonstration for understanding.

## 2016-02-28 NOTE — Assessment & Plan Note (Signed)
Likely mod to mod symptomatic, for pulm referral for furhter evaluatoin and tx

## 2016-02-28 NOTE — Assessment & Plan Note (Signed)
stable overall by history and exam, recent data reviewed with pt, and pt to continue medical treatment as before,  to f/u any worsening symptoms or concerns BP Readings from Last 3 Encounters:  02/20/16 142/82  08/30/15 156/80  04/30/15 146/74

## 2016-02-28 NOTE — Assessment & Plan Note (Signed)

## 2016-03-10 ENCOUNTER — Institutional Professional Consult (permissible substitution): Payer: BC Managed Care – PPO | Admitting: Internal Medicine

## 2016-03-13 ENCOUNTER — Other Ambulatory Visit: Payer: Self-pay | Admitting: Internal Medicine

## 2016-04-11 ENCOUNTER — Other Ambulatory Visit: Payer: Self-pay | Admitting: Internal Medicine

## 2016-05-12 IMAGING — CT CT HEAD W/O CM
2 series · 16 of 30 positions shown, 20 images · non-contrast
Comparison: None.

CLINICAL DATA: Pt c/o headache since [REDACTED], no injury, blurred
vision, pt poor historian, seems confused, unable to provide
detailed hx

EXAM:
CT HEAD WITHOUT CONTRAST
TECHNIQUE: Contiguous axial images were obtained from the base of the skull
through the vertex without intravenous contrast.

[Series 2: head w/o · axial · non-contrast · 0.45mm/px · z∈[-156,-31]mm · 13 of 31 slices shown, 17 images]
[im 3/31  brain]
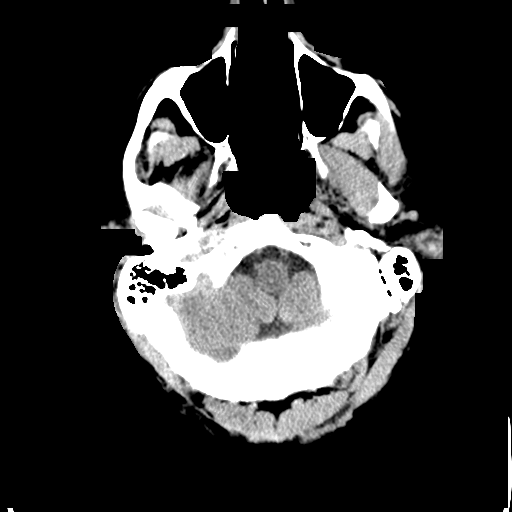
[im 3/31  bone]
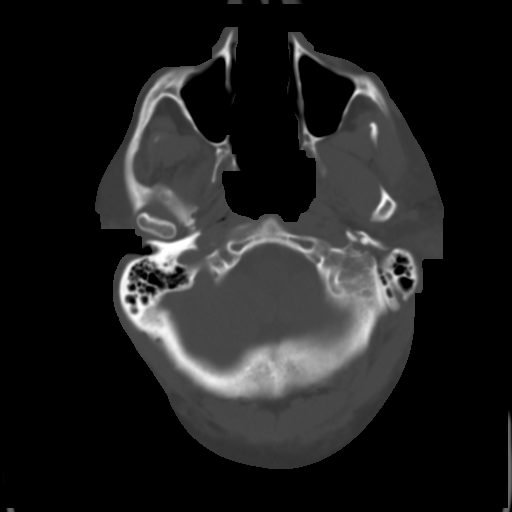
[im 5/31  brain]
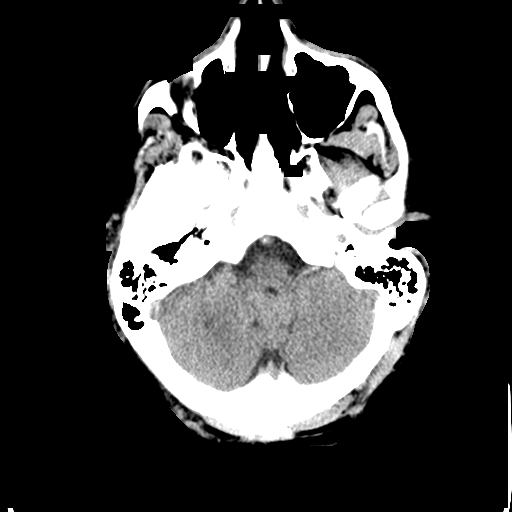
[im 7/31  brain]
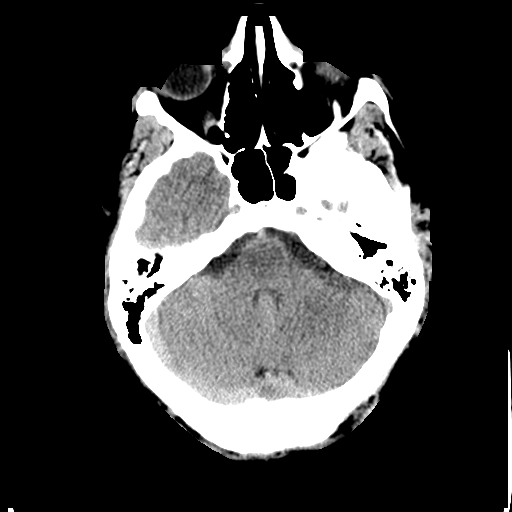
[im 9/31  brain]
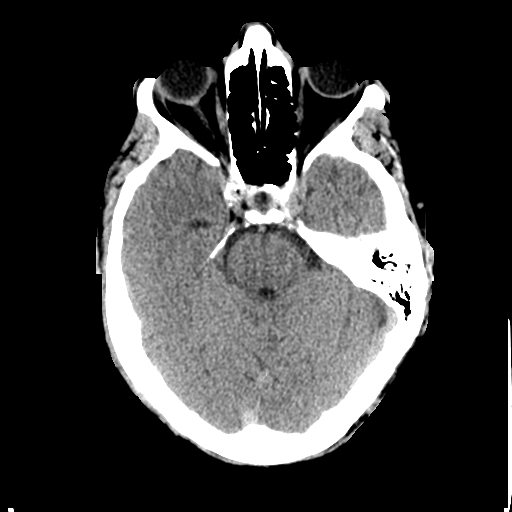
[im 11/31  brain]
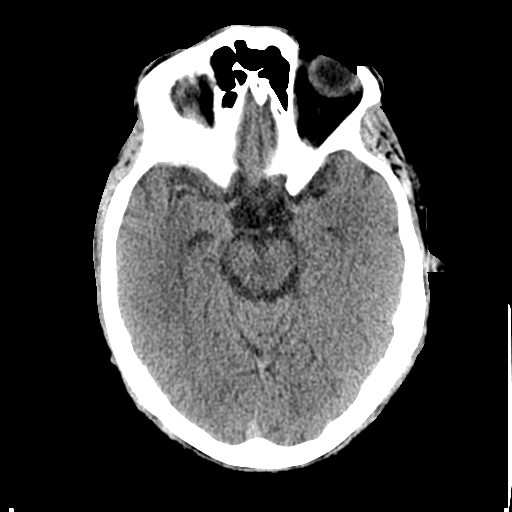
[im 11/31  bone]
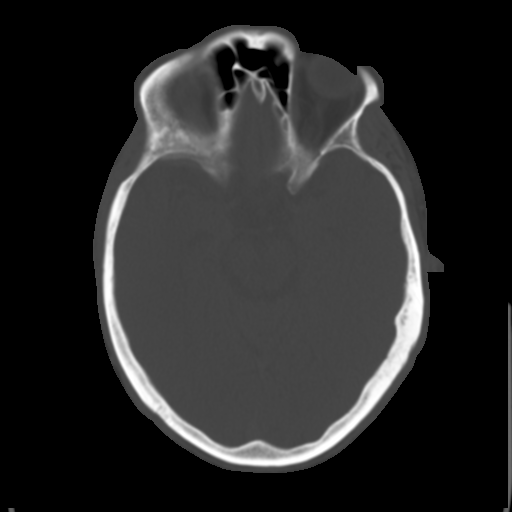
[im 13/31  brain]
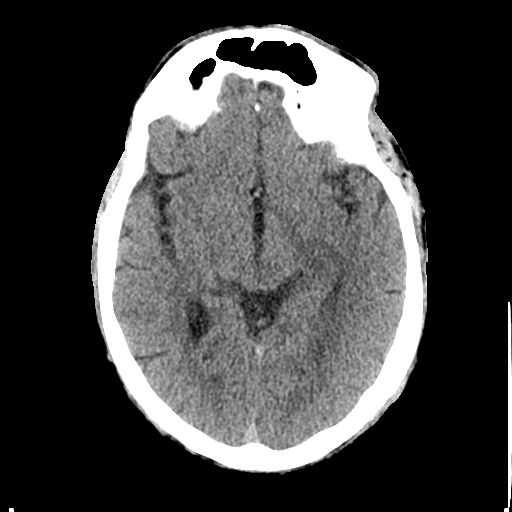
[im 16/31  brain]
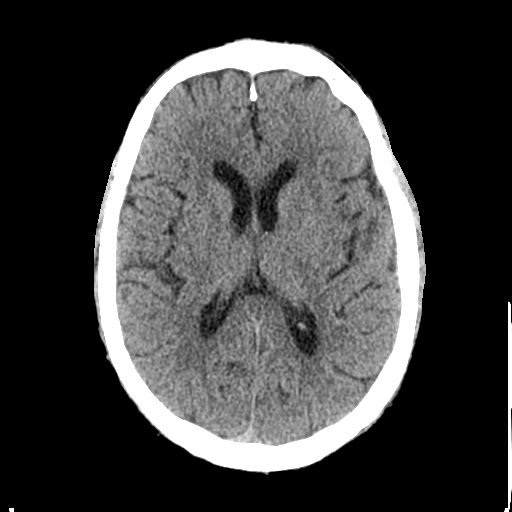
[im 18/31  brain]
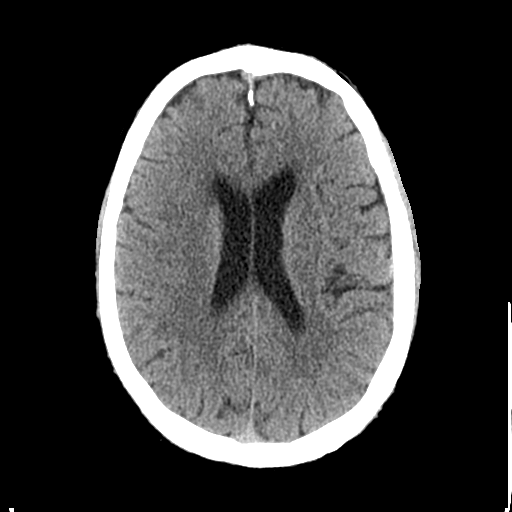
[im 20/31  brain]
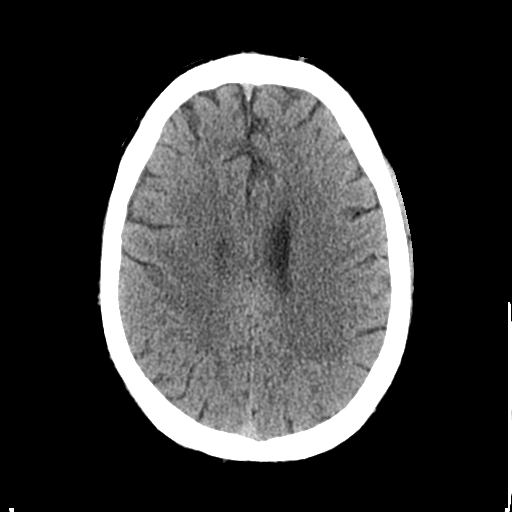
[im 20/31  bone]
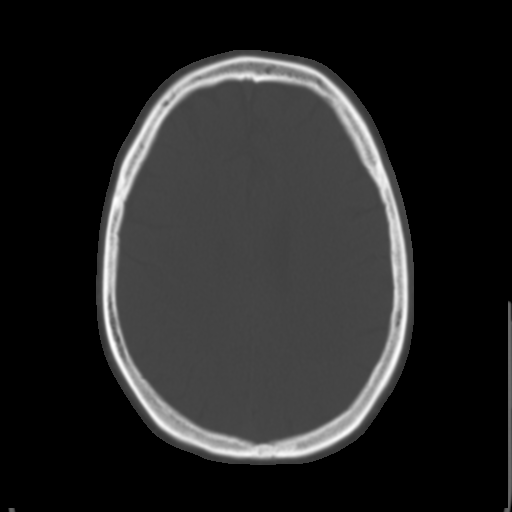
[im 22/31  brain]
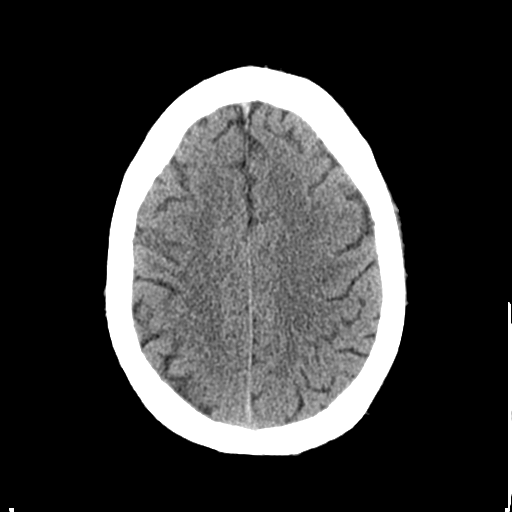
[im 24/31  brain]
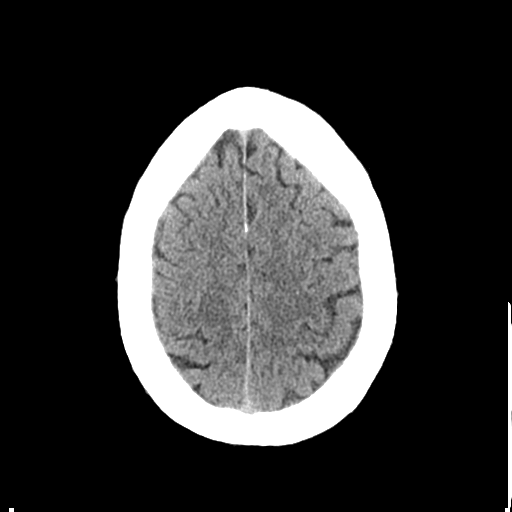
[im 26/31  brain]
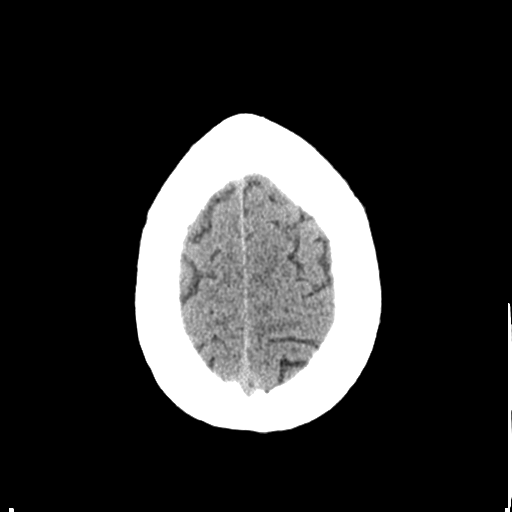
[im 28/31  brain]
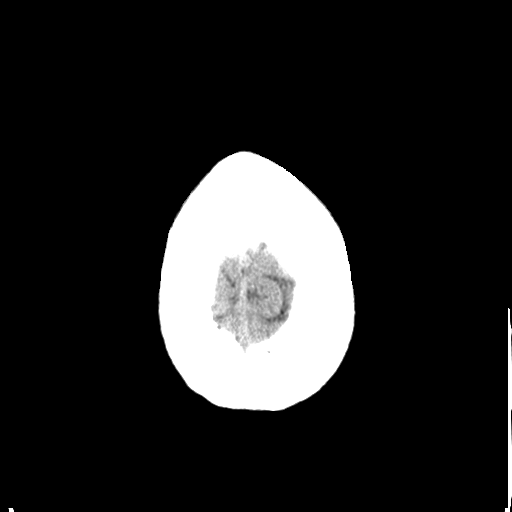
[im 28/31  bone]
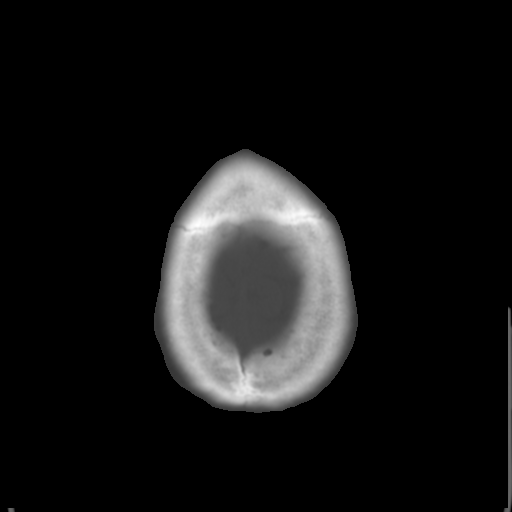

[Series 3: bone windows · axial · 0.45mm/px · z∈[-156,-116]mm · 3 of 31 slices shown]
[im 3/31  bone]
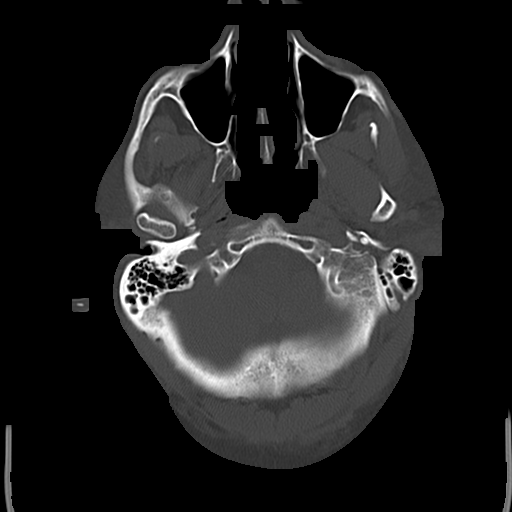
[im 7/31  bone]
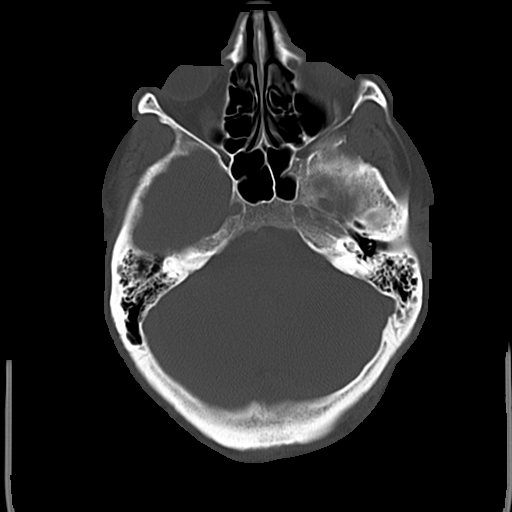
[im 11/31  bone]
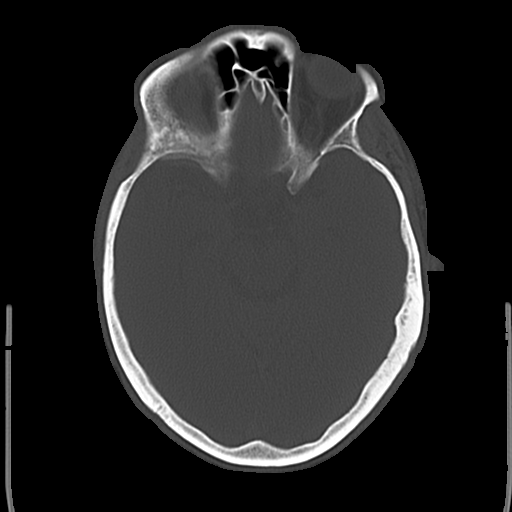

[16 of 30 positions shown; findings below may reference images not displayed]

FINDINGS: Ventricles are normal in configuration. There is ventricular and
sulcal enlargement reflecting mild age related volume loss. No
hydrocephalus. No parenchymal masses or mass effect. There is no
evidence of an infarct. Minor periventricular white matter
hypoattenuation is noted consistent with chronic microvascular
ischemic change.

There are no extra-axial masses or abnormal fluid collections.

No intracranial hemorrhage.

Visualized sinuses and mastoid air cells are clear.
IMPRESSION: 1. No acute intracranial abnormalities.
2. Age related volume loss and minor chronic microvascular ischemic
change.

## 2016-05-19 ENCOUNTER — Other Ambulatory Visit: Payer: Self-pay | Admitting: Neurology

## 2016-05-20 ENCOUNTER — Telehealth: Payer: Self-pay | Admitting: Neurology

## 2016-05-20 ENCOUNTER — Telehealth: Payer: Self-pay | Admitting: *Deleted

## 2016-05-20 MED ORDER — TAPENTADOL HCL 75 MG PO TABS: 75.0000 mg | ORAL_TABLET | Freq: Three times a day (TID) | ORAL | 0 refills | Status: DC | PRN

## 2016-05-20 MED ORDER — PREGABALIN 200 MG PO CAPS: ORAL_CAPSULE | ORAL | 0 refills | Status: DC

## 2016-05-20 MED ORDER — METHYLPHENIDATE HCL ER (OSM) 54 MG PO TBCR: 54.0000 mg | EXTENDED_RELEASE_TABLET | ORAL | 0 refills | Status: DC

## 2016-05-20 NOTE — Telephone Encounter (Signed)
Notified pt rx ready for pick-up.../lmb 

## 2016-05-20 NOTE — Telephone Encounter (Signed)
Done hardcopy to Corinne  

## 2016-05-20 NOTE — Addendum Note (Signed)
Addended by: Monte Fantasia on: 05/20/2016 04:43 PM   Modules accepted: Orders

## 2016-05-20 NOTE — Telephone Encounter (Signed)
Ok to refill x 1 month but will need appt. in next month for any additional refills

## 2016-05-20 NOTE — Telephone Encounter (Signed)
Rec'd call pt requesting refill on his Concerta.../lmb 

## 2016-05-20 NOTE — Telephone Encounter (Signed)
RXs printed, awaiting signature.

## 2016-05-20 NOTE — Telephone Encounter (Signed)
Patient called to request refills of LYRICA 200 MG capsule and Tapentadol HCl 75 MG TABS

## 2016-05-21 NOTE — Telephone Encounter (Signed)
Placed up front for pick up.  Called pt - left message letting him know prescriptions are ready and that he needs to schedule a follow up for further refills.

## 2016-05-26 ENCOUNTER — Telehealth: Payer: Self-pay | Admitting: Neurology

## 2016-05-26 NOTE — Telephone Encounter (Signed)
Pt called to advise that tapentadol HCl (NUCYNTA) 75 MG tablet needs PA per [employer]

## 2016-05-26 NOTE — Telephone Encounter (Signed)
Faith- this was routed to me by mistake. Dr [name] pt

## 2016-05-26 NOTE — Telephone Encounter (Signed)
LMOM.  Pt. last seen in Jan. 2017, was supposed to return for f/u in May-June.  He needs to make an appt. with RAS/fim

## 2016-05-27 ENCOUNTER — Telehealth: Payer: Self-pay | Admitting: *Deleted

## 2016-05-27 NOTE — Telephone Encounter (Signed)
Pt left msg on triage stating he is needing a PA on the concerta rx.Will be out of meds soon...Frank [name]

## 2016-05-27 NOTE — Telephone Encounter (Signed)
Patient states he has an appointment with Dr. [name] on 06-11-16 and is calling back to get PA for medication tapentadol HCl (NUCYNTA) 75 MG tablet.

## 2016-05-27 NOTE — Telephone Encounter (Signed)
PA initiated via CoverMyMeds key [id number]

## 2016-05-29 ENCOUNTER — Other Ambulatory Visit: Payer: Self-pay | Admitting: Internal Medicine

## 2016-05-29 NOTE — Telephone Encounter (Signed)
faxed

## 2016-05-29 NOTE — Telephone Encounter (Signed)
Done hardcopy to Corinne  

## 2016-05-29 NOTE — Telephone Encounter (Signed)
LMOM for pt.  I have spoken with [facility] this afternoon and have been told that Nucynta 75mg , one po tid does not require a PA--she sts. she ran a test clair and this rx. goes thru fine.  Pt. should check back with his pharmacy/fim

## 2016-06-01 IMAGING — RF DG SWALLOWING FUNCTION - NRPT [facility]
1 series · 14 of 24 positions shown · non-contrast
Comparison: none

[Series 1: run · 16 acquisitions, 14 frames shown]
[im 1/16]
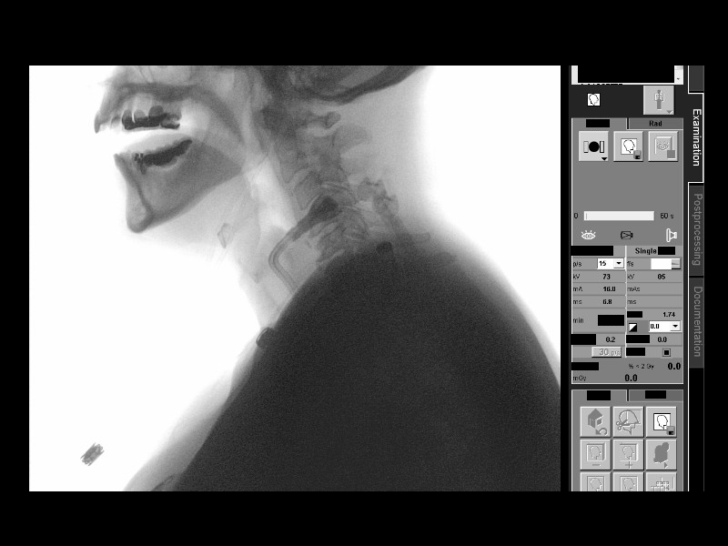
[im 2/16]
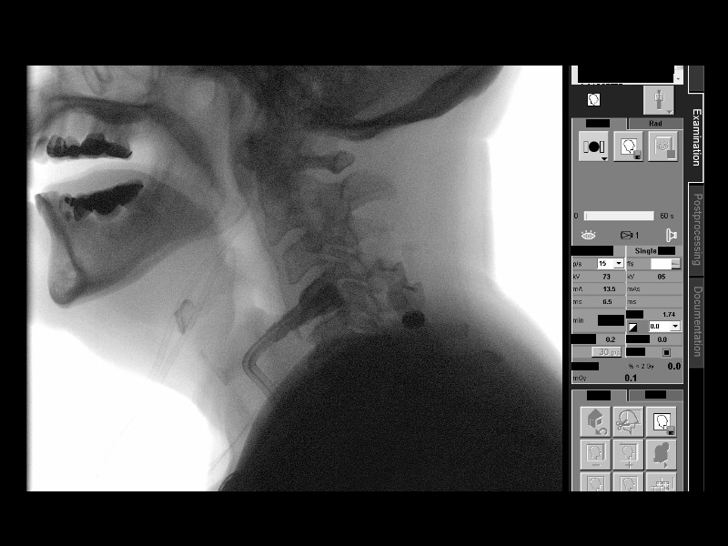
[im 3/16]
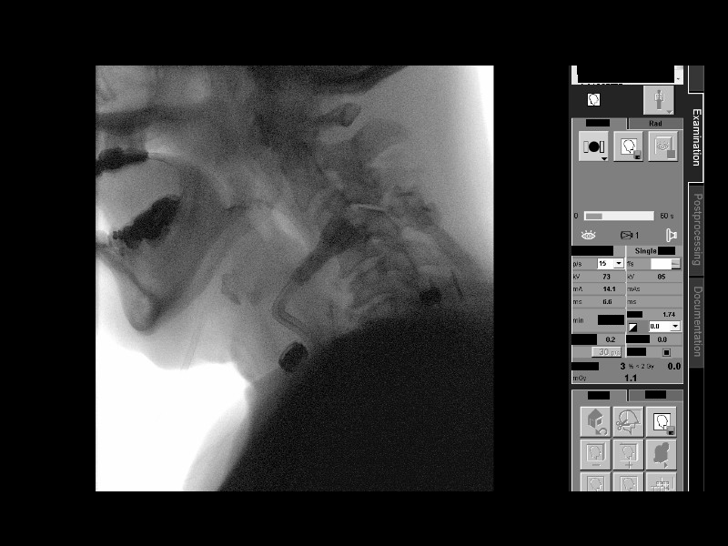
[im 5/16]
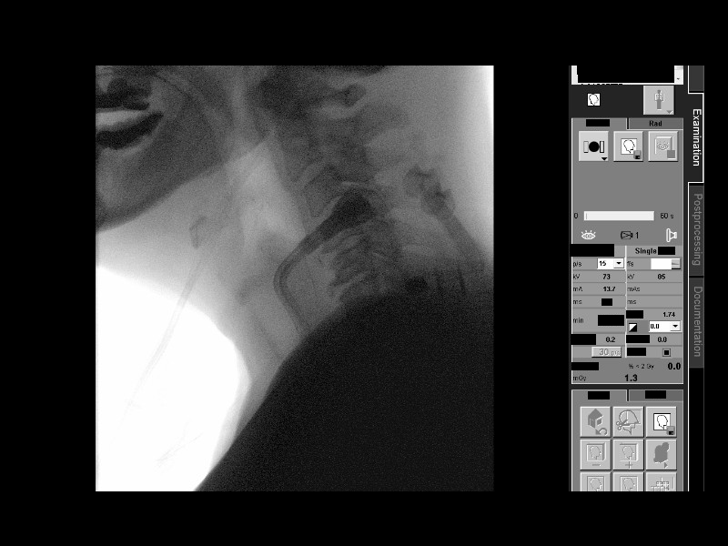
[im 5/16]
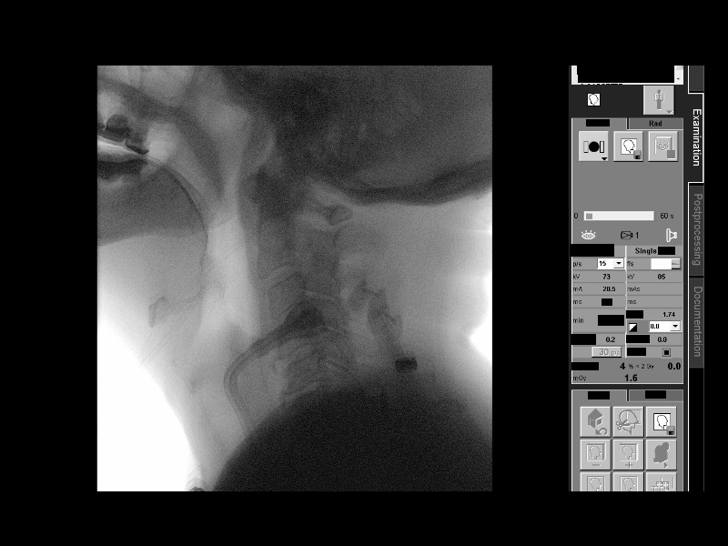
[im 7/16]
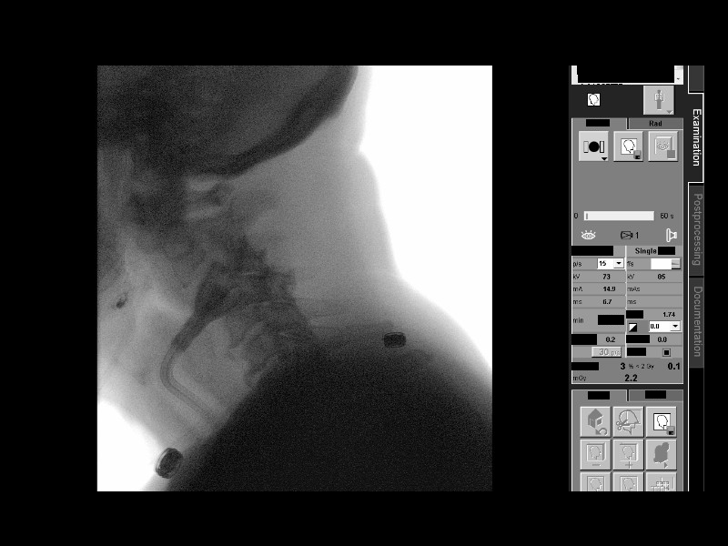
[im 8/16]
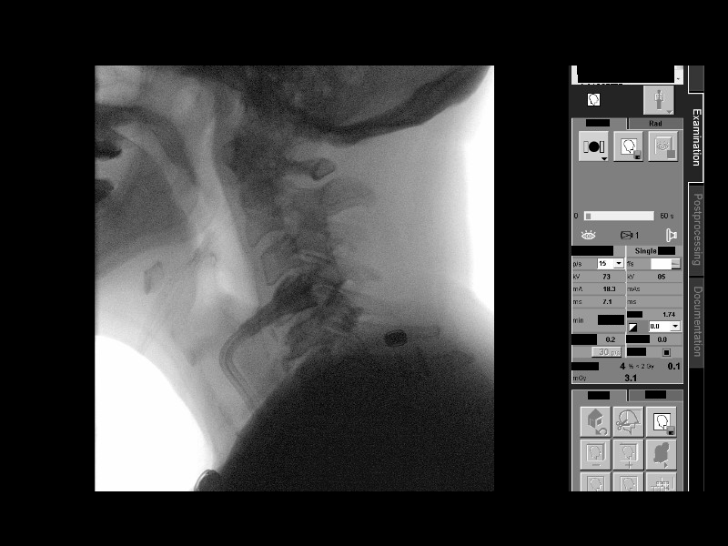
[im 9/16]
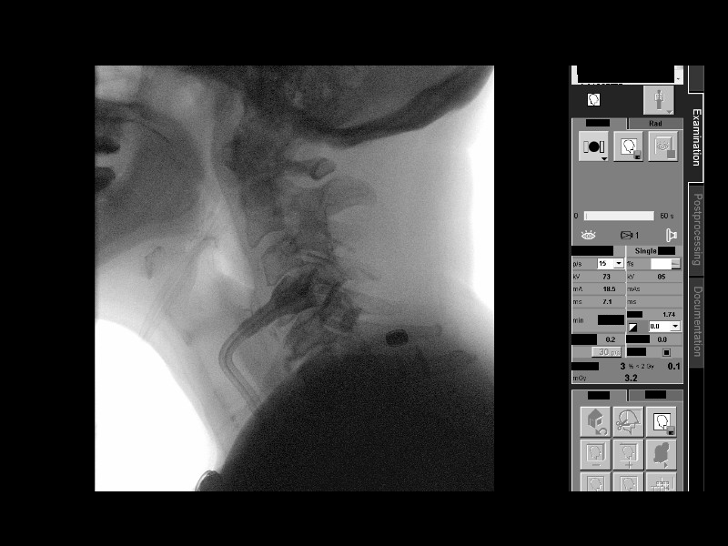
[im 10/16]
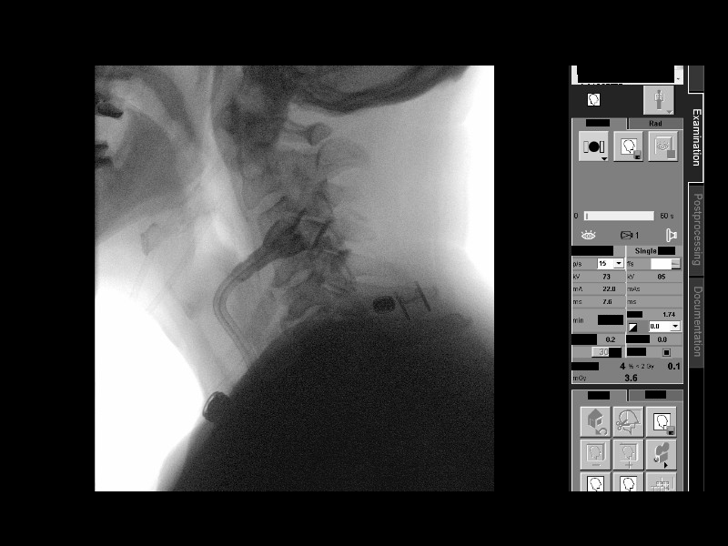
[im 12/16]
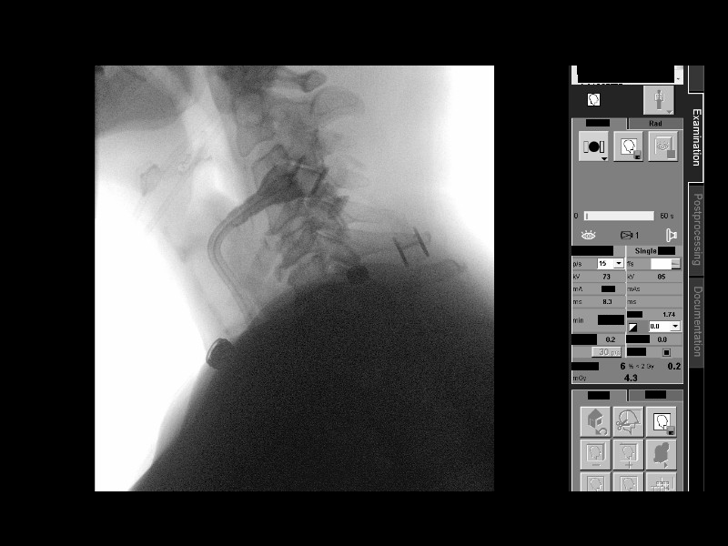
[im 13/16]
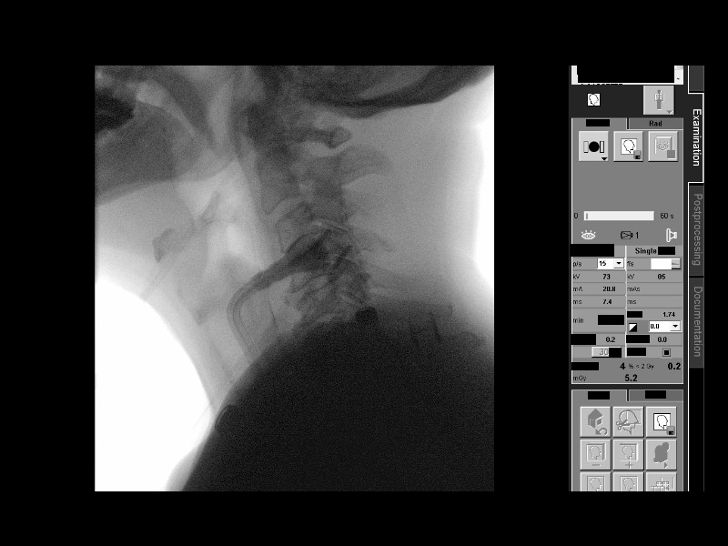
[im 14/16]
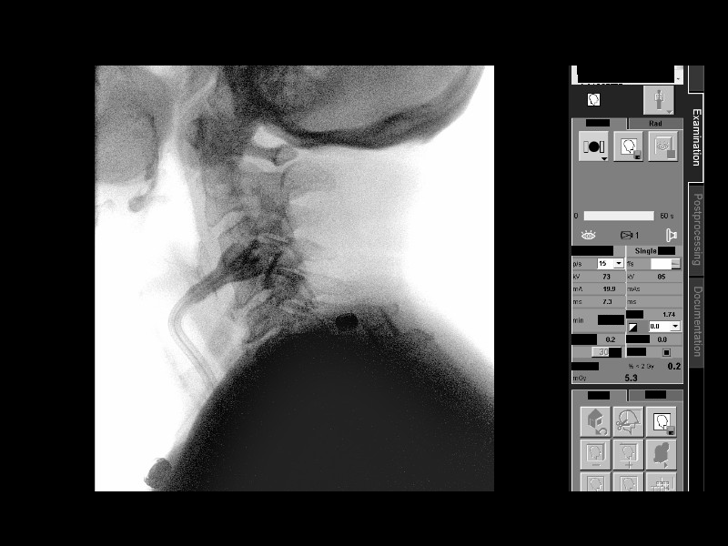
[im 15/16]
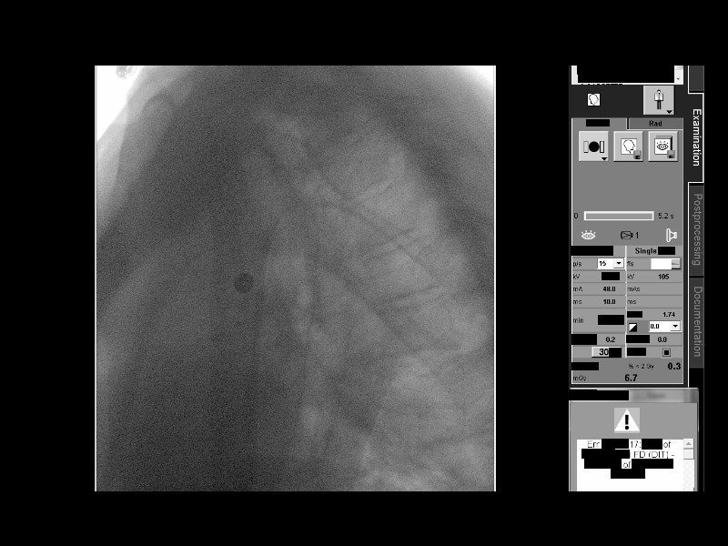
[im 16/16]
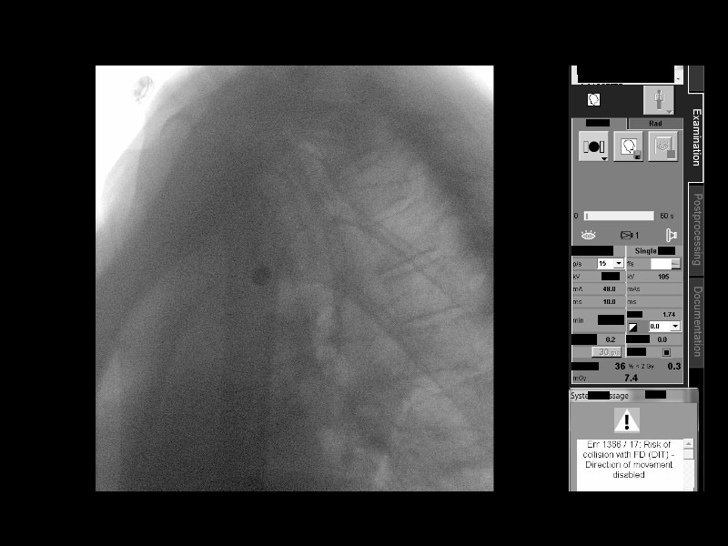

[14 of 24 positions shown; findings below may reference images not displayed]

Canned report from images found in remote index.

Refer to host system for actual result text.

## 2016-06-02 NOTE — Telephone Encounter (Signed)
Pt called to check up on this PA. Please give him a call back today if possible.   # [id number] Cell [phone]

## 2016-06-03 NOTE — Telephone Encounter (Signed)
PA APPROVED from 06/03/2016 - 06/04/2019. Pharmacy advised via fax, pt advised via personal VM

## 2016-06-11 ENCOUNTER — Ambulatory Visit (INDEPENDENT_AMBULATORY_CARE_PROVIDER_SITE_OTHER): Payer: BC Managed Care – PPO | Admitting: Neurology

## 2016-06-11 ENCOUNTER — Encounter: Payer: Self-pay | Admitting: Neurology

## 2016-06-11 VITALS — BP 130/76 | HR 74 | Resp 18 | Ht 69.0 in | Wt 237.0 lb

## 2016-06-11 DIAGNOSIS — G4733 Obstructive sleep apnea (adult) (pediatric): Secondary | ICD-10-CM | POA: Diagnosis not present

## 2016-06-11 DIAGNOSIS — F988 Other specified behavioral and emotional disorders with onset usually occurring in childhood and adolescence: Secondary | ICD-10-CM

## 2016-06-11 DIAGNOSIS — R208 Other disturbances of skin sensation: Secondary | ICD-10-CM

## 2016-06-11 DIAGNOSIS — G4719 Other hypersomnia: Secondary | ICD-10-CM | POA: Diagnosis not present

## 2016-06-11 DIAGNOSIS — G5603 Carpal tunnel syndrome, bilateral upper limbs: Secondary | ICD-10-CM | POA: Diagnosis not present

## 2016-06-11 DIAGNOSIS — E1142 Type 2 diabetes mellitus with diabetic polyneuropathy: Secondary | ICD-10-CM | POA: Diagnosis not present

## 2016-06-11 MED ORDER — DULOXETINE HCL 30 MG PO CPEP: 30.0000 mg | ORAL_CAPSULE | Freq: Three times a day (TID) | ORAL | 3 refills | Status: DC

## 2016-06-11 MED ORDER — TAPENTADOL HCL 75 MG PO TABS: 75.0000 mg | ORAL_TABLET | Freq: Three times a day (TID) | ORAL | 0 refills | Status: DC | PRN

## 2016-06-11 MED ORDER — ARIPIPRAZOLE 5 MG PO TABS: 5.0000 mg | ORAL_TABLET | Freq: Every day | ORAL | 5 refills | Status: DC

## 2016-06-11 NOTE — Progress Notes (Signed)
[facility]  PATIENT: Frank [name] DOB: Feb 08, 1956  _________________________________   HISTORICAL  CHIEF COMPLAINT:  Chief Complaint  Patient presents with  . Polyneuropathy    Sts. bilat legs up to knees are numb.  Sts. bilat feel numb, causing him to drop objects.  Sts. he stopped Nucynta about a month ago--sts. pharmacist told him they don't make this med any longer. Sts. anxiety/depression is improved with Cymbalta and Abilify.  He would like to have these refilled/fim  . History of GBS    HISTORY OF PRESENT ILLNESS:  Frank [name] is a 60 year old man with a chronic peripheral neuropathy who had superimposed Guillain Barr syndrome in November 2015.   He has neuropathic pain  Chronic neuropathic pain:  Pain is in the feet and up to mid-shin.   Current pain is treated with Lyrica, Duloxetine and Nucynta.   He states the drug store did not have Nucynta so it was not filled   Of note, his polyneuropathy pain preceded the diagnosis of diabetes by quite a few years.    Since the GBS the dysesthetic pain has increased.    Currently, numbness is in both legs up to his knees and in his hands.    He notes his gait is off balanced but he denies any falls.     He notes weakness in his feet and hands.     Mood:   He feels the depression is better on combination of Cymbalta and Abilify and he tolerates them well.     He would prefer not to be referred to a psychiatrist at this time.   He also takes lorazepam for anxiety.    CTS:   Left hand pain is unchanged.     He has a history of carpal tunnel syndrome and has had surgery in the past. He wears a splint some nights.  In the past, carpal tunnel injections have helped a little bit .    OSA:   In past he was diagnosed with OSA more than 5 years ago but never started CPAP.   He notes some fatigue and sleepiness.   He falls asleep watching TV and in the evenings.   His mother had narcolepsy.      EPWORTH SLEEPINESS  SCALE  On a scale of 0 - 3 what is the chance of dozing:  Sitting and Reading:   0 Watching TV:    3 Sitting inactive in a public place: 1 Passenger in car for one hour: 3 Lying down to rest in the afternoon: 3 Sitting and talking to someone: 0 Sitting quietly after lunch:  3 In a car, stopped in traffic:  0  Total (out of 24):  13/24  (mild EDS)   GBS History:   The initial episode of GBS was treated with plasmapheresis. He reports that the diagnosis took a couple weeks and he had several emergency room visits. Initially he was sent to a skilled nursing facility. He was reporting a lot of back pain alongside the weakness. A lumbar puncture was eventually done showing high protein which led to the dye diagnosis of GBS.  He had severe weakness at the peak and required intubation for respiratory support. At that time, he was unable to move his legs and could barely move his arms. He improved quite a bit after plasmapheresis and was discharged. However, a couple days after discharge he began to feel weak again and return to the emergency room. He was recently admitted  for recurrent GBS. For that hospital stay he did not require intubation but did have a facemask oxygen. He also was treated with IVIG the second time around. He improved and was discharged home. He did outpatient physical therapy. He has had a fairly good in movement of his strength and is able to walk independently. However, he has had more dysesthetic pain.   With the exception of more pain, he otherwise feels very close to his pre-GBS baseline.   Of note, he had a flu shot a few weeks before the first episode of GBS.  MRI's from 06/2014 and 07/2014:    Cervical MRI shows spinal stenosis at C4-C5, C5-C6 and C6-C7. There is no spinal cord compression. Thoracic spine shows T8-T9 disc extrusion to the left that does not cause spinal cord compression. There are mild degenerative changes in the lumbar spine. Contrast was not used.   I also  reviewed many of the notes and labs from his hospital stay. CSF Protein was greatly elevated at 118 and the CSF white blood cell count was 1.    REVIEW OF SYSTEMS: Constitutional: No fevers, chills, sweats, or change in appetite.   Has sleepiness Eyes: No visual changes, double vision, eye pain Ear, nose and throat: No hearing loss, ear pain, nasal congestion, sore throat Cardiovascular: No chest pain, palpitations Respiratory: No shortness of breath at rest or with exertion.   No wheezes.  He has OSA GastrointestinaI: No nausea, vomiting, diarrhea, abdominal pain, fecal incontinence Genitourinary: No dysuria, urinary retention or frequency.  No nocturia. Musculoskeletal: No neck pain, back pain Integumentary: No rash, pruritus, skin lesions Neurological: as above Psychiatric: Anxiety and depression at this time.   ADD is stable. Endocrine: No palpitations, diaphoresis, change in appetite, change in weigh or increased thirst Hematologic/Lymphatic: No anemia, purpura, petechiae. Allergic/Immunologic: No itchy/runny eyes, nasal congestion, recent allergic reactions, rashes  ALLERGIES: Allergies  Allergen Reactions  . Codeine Itching    REACTION: Itching  . Metformin And Related Other (See Comments)    GI upset, diarrhea  . Shellfish Allergy     HOME MEDICATIONS:  Current Outpatient Prescriptions:  .  allopurinol (ZYLOPRIM) 300 MG tablet, Take 1 tablet (300 mg total) by mouth daily., Disp: 30 tablet, Rfl: 11 .  amLODipine (NORVASC) 10 MG tablet, Take 1 tablet (10 mg total) by mouth daily., Disp: 90 tablet, Rfl: 3 .  ARIPiprazole (ABILIFY) 5 MG tablet, Take 1 tablet (5 mg total) by mouth daily. Please call [phone] to schedule an appt., Disp: 30 tablet, Rfl: 0 .  aspirin EC 81 MG tablet, Take 1 tablet (81 mg total) by mouth daily., Disp: , Rfl:  .  Dulaglutide (TRULICITY) [id number] [id number] SOPN, 0.5 cc sq per week, Disp: 6 mL, Rfl: 11 .  DULoxetine (CYMBALTA) 30 MG capsule, Take  1 capsule (30 mg total) by mouth 3 (three) times daily., Disp: 270 capsule, Rfl: 3 .  famotidine (PEPCID) 40 MG tablet, TAKE 1 TABLET(40 MG) BY MOUTH TWICE DAILY, Disp: 180 tablet, Rfl: 3 .  glipiZIDE (GLUCOTROL XL) 2.5 MG 24 hr tablet, TAKE 1 TABLET BY MOUTH DAILY WITH BREAKFAST, Disp: 90 tablet, Rfl: 0 .  glipiZIDE (GLUCOTROL) 10 MG tablet, Take 10 mg by mouth daily before breakfast., Disp: , Rfl:  .  hydrochlorothiazide (MICROZIDE) 12.5 MG capsule, TAKE ONE CAPSULE BY MOUTH EVERY DAY, Disp: 90 capsule, Rfl: 0 .  linagliptin (TRADJENTA) 5 MG TABS tablet, Take 1 tablet (5 mg total) by mouth daily., Disp: 90 tablet, Rfl: 3 .  LORazepam (ATIVAN) 0.5 MG tablet, TAKE 1 TABLET BY MOUTH TWICE DAILY, Disp: 60 tablet, Rfl: 2 .  methocarbamol (ROBAXIN) 500 MG tablet, TAKE 1 TABLET(500 MG) BY MOUTH EVERY 8 HOURS AS NEEDED FOR MUSCLE SPASMS, Disp: 90 tablet, Rfl: 0 .  methylphenidate (CONCERTA) 54 MG PO CR tablet, Take 1 tablet (54 mg total) by mouth every morning., Disp: 30 tablet, Rfl: 0 .  pantoprazole (PROTONIX) 40 MG tablet, Take 1 tablet (40 mg total) by mouth daily., Disp: 30 tablet, Rfl: 11 .  pregabalin (LYRICA) 200 MG capsule, TAKE (1) CAPSULE THREE TIMES DAILY., Disp: 270 capsule, Rfl: 0 .  rosuvastatin (CRESTOR) 40 MG tablet, Take 1 tablet (40 mg total) by mouth daily., Disp: 90 tablet, Rfl: 3 .  tapentadol HCl (NUCYNTA) 75 MG tablet, Take 1 tablet (75 mg total) by mouth 3 (three) times daily as needed. (Patient not taking: Reported on 06/11/2016), Disp: 90 tablet, Rfl: 0  PAST MEDICAL HISTORY: Past Medical History:  Diagnosis Date  . Abdominal pain, left lower quadrant 06/06/2010  . Abdominal pain, unspecified site 01/19/2009  . ADD 10/09/2008  . ALLERGIC RHINITIS 10/09/2008  . ANXIETY DEPRESSION 02/01/2008  . COLONIC POLYPS 02/01/2008  . DIABETES MELLITUS, TYPE II 05/20/2010  . DYSPNEA 03/12/2010  . ERECTILE DYSFUNCTION 10/09/2008  . ERECTILE DYSFUNCTION, ORGANIC 05/20/2010  . FOOT PAIN, LEFT  05/20/2010  . GERD 02/01/2008  . Guillain Barr syndrome ([place])   . HEMORRHOIDS 02/01/2008  . HIATAL HERNIA 02/01/2008  . HYPERLIPIDEMIA 10/09/2008  . HYPERTENSION 10/09/2008  . MORTON'S NEUROMA 05/20/2010  . Other specified forms of hearing loss 06/27/2009  . PERIPHERAL EDEMA 05/20/2010  . PERIPHERAL NEUROPATHY 05/20/2010  . SLEEP APNEA, OBSTRUCTIVE 02/01/2008  . Stricture and stenosis of esophagus 02/02/2008  . Type II or unspecified type diabetes mellitus without mention of complication, uncontrolled 11/14/2010  . WRIST PAIN, LEFT 12/05/2009    PAST SURGICAL HISTORY: Past Surgical History:  Procedure Laterality Date  . CARPAL TUNNEL RELEASE    . ESOPHAGEAL DILATION  july 2009  . ESOPHAGOGASTRODUODENOSCOPY N/A 06/27/2014   Procedure: ESOPHAGOGASTRODUODENOSCOPY (EGD);  Surgeon: Lafayette Dragon, MD;  Location: [facility];  Service: Endoscopy;  Laterality: N/A;  . EYE SURGERY     catract surgery on both eyes  . ROTATOR CUFF REPAIR      FAMILY HISTORY: Family History  Problem Relation Age of Onset  . Diabetes Mother   . Heart disease Mother   . Hyperlipidemia Mother   . Depression Mother   . Diabetes Brother   . Colon cancer Neg Hx     SOCIAL HISTORY:  Social History   Social History  . Marital status: Single    Spouse name: N/A  . Number of children: N/A  . 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 Smoking status: Never Smoker  . Smokeless tobacco: Never Used  . Alcohol use Yes     Comment: occasional  . Drug use:     Types: Methaqualone  . Sexual activity: Yes     Comment: before I got sick   Other Topics Concern  . Not on file   Social History Narrative  . No narrative on file     PHYSICAL EXAM  Vitals:   06/11/16 1307  BP: 130/76  Pulse: 74  Resp: 18  Weight: 237 lb (107.5 kg)  Height: 5\' 9"  (1.753 m)    Body mass index is 35 kg/m.  General: The patient is well-developed and  well-nourished and in no acute distress  Skin: Extremities are without rash. He has mild pedal edema, slightly worse on the left.    HEENT:   His pharynx is Mallampatti 4.  Neurologic Exam  Mental status: The patient is alert and oriented x 3 at the time of the examination. The patient has apparent normal recent and remote memory, with an apparently normal attention span and concentration ability.   Speech is normal.  Cranial nerves: Extraocular movements are full. Pupils are equal, round, and reactive to light and accomodation.  Visual fields are full.  Facial symmetry is present. There is good facial sensation to soft touch bilaterally.Facial strength is normal.  Trapezius and sternocleidomastoid strength is normal. No dysarthria is noted.  The tongue is midline, and the patient has symmetric elevation of the soft palate. No obvious hearing deficits are noted.  Motor:  Muscle bulk is normal.   Tone is normal. Strength is  5 / 5 proximally in all 4 extremities but 4+/5 in left APB, 4+ in bilateral interossei hand muscles (better on left), 4/5 in both EHL muscles  Sensory: Sensory testing is intact in hands upper legs.   Decreased vibration at ankles, absent at toes, reduced touch/temp in feet and lower legs up to ankle.  Coordination: Cerebellar testing reveals good finger-nose-finger and reduced heel-to-shin bilaterally.  Gait and station: Station is normal.   Gait is normal. Tandem gait is wide. Romberg is positive.   Reflexes: Deep tendon reflexes are symmetric and 1+ bilaterally at deltoid and trace at knees, absent at ankles.        DIAGNOSTIC DATA (LABS, IMAGING, TESTING) - I reviewed patient records, labs, notes, testing and imaging myself where available.  Lab Results  Component Value Date   WBC 8.8 02/20/2016   HGB 15.1 02/20/2016   HCT 44.9 02/20/2016   MCV 85.5 02/20/2016   PLT 208.0 02/20/2016      Component Value Date/Time   NA 137 02/20/2016 1149   K 4.6  02/20/2016 1149   CL 99 02/20/2016 1149   CO2 27 02/20/2016 1149   GLUCOSE 305 (H) 02/20/2016 1149   BUN 16 02/20/2016 1149   CREATININE 1.12 02/20/2016 1149   CALCIUM 9.6 02/20/2016 1149   PROT 7.8 02/20/2016 1149   PROT 7.0 08/30/2015 1157   ALBUMIN 4.4 02/20/2016 1149   AST 30 02/20/2016 1149   ALT 50 02/20/2016 1149   ALKPHOS 106 02/20/2016 1149   BILITOT 0.6 02/20/2016 1149   GFRNONAA >90 08/06/2014 0240   GFRAA >90 08/06/2014 0240   Lab Results  Component Value Date   CHOL 240 (H) 02/20/2016   HDL 45.10 02/20/2016   LDLCALC 130 (H) 05/04/2014   LDLDIRECT 148.0 02/20/2016   TRIG 343.0 (H) 02/20/2016   CHOLHDL 5 02/20/2016   Lab Results  Component Value Date   HGBA1C 11.4 (H) 02/20/2016   Lab Results  Component Value Date   VITAMINB12 316 08/30/2015   Lab Results  Component Value Date   TSH 1.63 02/20/2016       ASSESSMENT AND PLAN  Diabetic polyneuropathy associated with type 2 diabetes mellitus (HCC)  Bilateral carpal tunnel syndrome  Attention deficit disorder, unspecified hyperactivity presence  Obstructive sleep apnea - Plan: Split night study  Dysesthesia  Excessive daytime sleepiness - Plan: Split night study     1.   Continue Nucynta and Lyrica for pain 2.   Continue Cymbalta for pain and mood.  Continue  Abilify as the combination works better for him 3.  He has known OSA and has mild to moderate excessive daytime sleepiness (despite stimulants). Additionally he has gained weight this year. We need to check a split-night study and start CPAP or other therapy if he has moderate or severe sleep apnea. 4.   He will return to see me in 4-5 months or sooner if he has new or worsening neurologic symptoms.      Richard A. [name], MD, PhD [id number], [id number] PM Certified in Neurology, Clinical Neurophysiology, Sleep Medicine, Pain Medicine and Neuroimaging  [facility] [address] [place], [place] [address] [phone]

## 2016-07-09 ENCOUNTER — Other Ambulatory Visit: Payer: Self-pay | Admitting: Internal Medicine

## 2016-07-11 ENCOUNTER — Telehealth: Payer: Self-pay

## 2016-07-11 MED ORDER — METHYLPHENIDATE HCL ER (OSM) 54 MG PO TBCR: 54.0000 mg | EXTENDED_RELEASE_TABLET | ORAL | 0 refills | Status: DC

## 2016-07-11 NOTE — Telephone Encounter (Signed)
Patient is aware 

## 2016-07-11 NOTE — Telephone Encounter (Signed)
Done hardcopy to Corinne  

## 2016-07-11 NOTE — Addendum Note (Signed)
Addended by: Biagio Borg on: 07/11/2016 12:27 PM   Modules accepted: Orders

## 2016-07-11 NOTE — Telephone Encounter (Signed)
Please advise patient is requesting refill on medication concerta

## 2016-08-21 ENCOUNTER — Telehealth: Payer: Self-pay | Admitting: Neurology

## 2016-08-21 MED ORDER — PREGABALIN 200 MG PO CAPS: ORAL_CAPSULE | ORAL | 0 refills | Status: DC

## 2016-08-21 MED ORDER — TAPENTADOL HCL 75 MG PO TABS: 75.0000 mg | ORAL_TABLET | Freq: Three times a day (TID) | ORAL | 0 refills | Status: DC | PRN

## 2016-08-21 NOTE — Telephone Encounter (Signed)
Lyrica and Nucynta rx's faxed to [facility]

## 2016-08-21 NOTE — Addendum Note (Signed)
Addended by: France Ravens I on: 08/21/2016 11:25 AM   Modules accepted: Orders

## 2016-08-21 NOTE — Telephone Encounter (Signed)
Patient requesting refills for pregabalin (LYRICA) 200 MG capsule and tapentadol HCl (NUCYNTA) 75 MG tablet.

## 2016-08-21 NOTE — Telephone Encounter (Signed)
Rx's awaiting RAS sig/fim 

## 2016-08-22 NOTE — Telephone Encounter (Signed)
Hard copy of rx's up front [facility].  Pt. aware and will pick up Monday/fim

## 2016-08-22 NOTE — Telephone Encounter (Signed)
Patient is calling in reference to [place].  Patient is needing a hard copy to pick up, per patient they were faxed to [facility]

## 2016-09-02 ENCOUNTER — Other Ambulatory Visit: Payer: Self-pay | Admitting: Internal Medicine

## 2016-09-02 NOTE — Telephone Encounter (Signed)
faxed

## 2016-09-02 NOTE — Telephone Encounter (Signed)
Done hardcopy to Corinne  

## 2016-09-03 ENCOUNTER — Telehealth: Payer: Self-pay | Admitting: Internal Medicine

## 2016-09-03 MED ORDER — METHYLPHENIDATE HCL ER (OSM) 54 MG PO TBCR: 54.0000 mg | EXTENDED_RELEASE_TABLET | ORAL | 0 refills | Status: DC

## 2016-09-03 NOTE — Telephone Encounter (Signed)
Done hardcopy to Corinne  

## 2016-09-03 NOTE — Telephone Encounter (Signed)
Please call back

## 2016-09-03 NOTE — Telephone Encounter (Signed)
Left message on Mr. Lacinda Axon phone number provided.  [phone]  Unable to reach left message stating that prescription in ready for pick up.

## 2016-09-03 NOTE — Telephone Encounter (Signed)
Called twice. Unable to reach. Left message ready for pick up.

## 2016-09-03 NOTE — Telephone Encounter (Signed)
Pt's significant other Althia Forts came in to pick up a RX, only prescription that was refilled was his lorazepam yesterday and that was sent to his pharmacy. But when calling pt, Legrand Como stated that Marden Noble called [facility] two weeks ago and stated he needed his Concerta refilled and that he got a phone call that it was ready for pick up. Pt needs his Concerta refilled ASAP, please call Legrand Como this afternoon. MS

## 2016-09-14 ENCOUNTER — Other Ambulatory Visit: Payer: Self-pay | Admitting: Neurology

## 2016-09-24 ENCOUNTER — Telehealth: Payer: Self-pay | Admitting: Internal Medicine

## 2016-09-24 NOTE — Telephone Encounter (Signed)
Pt left vm on [facility] phone stating we called about his BP med. Left pt vm to call our office back, need more information.

## 2016-09-25 NOTE — Telephone Encounter (Signed)
Called patient unable to reach left message with patient to give [place] a call back regarding this.

## 2016-10-01 ENCOUNTER — Encounter: Payer: Self-pay | Admitting: *Deleted

## 2016-10-01 ENCOUNTER — Telehealth: Payer: Self-pay | Admitting: Neurology

## 2016-10-01 DIAGNOSIS — G5603 Carpal tunnel syndrome, bilateral upper limbs: Secondary | ICD-10-CM | POA: Insufficient documentation

## 2016-10-01 NOTE — Telephone Encounter (Signed)
Frank [name], patient states someone called him yesterday in regard to this.  Patient is still waiting on PA.  Can you call him back at [phone]

## 2016-10-01 NOTE — Telephone Encounter (Signed)
Patient stating [facility] on [address] needs PA for medication DULoxetine (CYMBALTA) 30 MG capsule.

## 2016-10-03 ENCOUNTER — Telehealth: Payer: Self-pay | Admitting: *Deleted

## 2016-10-03 NOTE — Telephone Encounter (Signed)
Cymbalta PA completed by phone by phone.  This is a QLE.  Pt.'s pain, anx/dep have responded better to mult. smaller doses rather than 1-2 larger doses.  Information will be pended to clinical pharmacist for review, will receive an answer w/i 72 hours.  PA# [id number].  Pt. aware pa is complete and now waiting on response/fim

## 2016-10-03 NOTE — Telephone Encounter (Signed)
Patient is calling in reference to see if there have been any updates with his PA for Cymbalta.  Patient has been off of medication for a week.  Please call

## 2016-10-03 NOTE — Telephone Encounter (Signed)
I have spoken with pt. this morning and explained that I have not had time to complete a pa for Cymbalta yet, will work on this as soon as I can and let him know once it is done/fim

## 2016-10-03 NOTE — Telephone Encounter (Signed)
Neurology had called patient about PA for cymbalta---patient has already talked with that dept

## 2016-10-03 NOTE — Telephone Encounter (Signed)
Cymbalta PA initiated.  Pt. aware.  See PA note/fim

## 2016-10-06 NOTE — Telephone Encounter (Signed)
Fax received from [facility].  Cymbalta PA approved thru 08-10-17 under pt's Medicare part D benefit.  PT. ID# [id number].  PA# [id number]

## 2016-10-11 ENCOUNTER — Other Ambulatory Visit: Payer: Self-pay | Admitting: Internal Medicine

## 2016-10-13 ENCOUNTER — Encounter: Payer: Self-pay | Admitting: Neurology

## 2016-10-13 ENCOUNTER — Ambulatory Visit (INDEPENDENT_AMBULATORY_CARE_PROVIDER_SITE_OTHER): Payer: Medicare Other | Admitting: Neurology

## 2016-10-13 VITALS — BP 148/93 | HR 103 | Resp 18 | Ht 69.0 in | Wt 236.0 lb

## 2016-10-13 DIAGNOSIS — G629 Polyneuropathy, unspecified: Secondary | ICD-10-CM

## 2016-10-13 DIAGNOSIS — G5603 Carpal tunnel syndrome, bilateral upper limbs: Secondary | ICD-10-CM | POA: Diagnosis not present

## 2016-10-13 DIAGNOSIS — G4733 Obstructive sleep apnea (adult) (pediatric): Secondary | ICD-10-CM | POA: Diagnosis not present

## 2016-10-13 DIAGNOSIS — E1165 Type 2 diabetes mellitus with hyperglycemia: Secondary | ICD-10-CM

## 2016-10-13 DIAGNOSIS — E1142 Type 2 diabetes mellitus with diabetic polyneuropathy: Secondary | ICD-10-CM

## 2016-10-13 DIAGNOSIS — R208 Other disturbances of skin sensation: Secondary | ICD-10-CM

## 2016-10-13 DIAGNOSIS — IMO0002 Reserved for concepts with insufficient information to code with codable children: Secondary | ICD-10-CM

## 2016-10-13 DIAGNOSIS — R269 Unspecified abnormalities of gait and mobility: Secondary | ICD-10-CM

## 2016-10-13 MED ORDER — ARIPIPRAZOLE 10 MG PO TABS: 10.0000 mg | ORAL_TABLET | Freq: Every day | ORAL | 5 refills | Status: DC

## 2016-10-13 MED ORDER — PREGABALIN 200 MG PO CAPS: ORAL_CAPSULE | ORAL | 1 refills | Status: DC

## 2016-10-13 MED ORDER — TAPENTADOL HCL 75 MG PO TABS: 75.0000 mg | ORAL_TABLET | Freq: Three times a day (TID) | ORAL | 0 refills | Status: DC | PRN

## 2016-10-13 NOTE — Progress Notes (Signed)
[facility]  PATIENT: Frank [name] DOB: Sep 21, 1955  _________________________________   HISTORICAL  CHIEF COMPLAINT:  Chief Complaint  Patient presents with  . Polyneuropathy    Sts. neuropathy is ok with current med regimen/fim    HISTORY OF PRESENT ILLNESS:  Frank [name] is a 61 year old man with a chronic peripheral neuropathy who had superimposed Guillain Barr syndrome in November 2015.   He has neuropathic pain in the lower legs and also has pain in the hands, worse on the left.   Chronic neuropathic pain:  He has tingling burning pain up to the knee, higher than before.    Current pain is treated with Lyrica, Duloxetine and Nucynta.  The polyneuropathy pain preceded the diagnosis of diabetes by quite a few years.    Since the GBS he feels that the dysesthetic pain is wprse.    He also repots numbness is in both legs up to his knees and in his hands.    His gait is off balanced but he denies any falls.     He notes weakness in his feet and hands.     Mood:   He feels the depression is better on combination of Cymbalta and Abilify.   He would like to go up on Abilify as it has helped    He would prefer not to be referred to a psychiatrist at this time.   He also takes lorazepam for anxiety.    CTS:   His left hand pain is unchanged and still bothersome at times.     He has a history of carpal tunnel syndrome.   He had surgery in the past.   He is not wearing the splint much since it did not help.    In the past, carpal tunnel injections have helped a little bit .    OSA:   He was diagnosed with OSA around 2011 but never started CPAP.   He does not want to use CPAP or an oral appliance.   He notes fatigue and sleepiness.   He falls asleep watching TV and in the evenings.         EPWORTH SLEEPINESS SCALE  On a scale of 0 - 3 what is the chance of dozing:  Sitting and Reading:   1 Watching TV:    3 Sitting inactive in a public place: 1 Passenger in car  for one hour: 3 Lying down to rest in the afternoon: 3 Sitting and talking to someone: 0 Sitting quietly after lunch:  3 In a car, stopped in traffic:  0  Total (out of 24):  14/24  (mild EDS)   GBS History:   The initial episode of GBS was treated with plasmapheresis. He reports that the diagnosis took a couple weeks and he had several emergency room visits. Initially he was sent to a skilled nursing facility. He was reporting a lot of back pain alongside the weakness. A lumbar puncture was eventually done showing high protein which led to the dye diagnosis of GBS.  He had severe weakness at the peak and required intubation for respiratory support. At that time, he was unable to move his legs and could barely move his arms. He improved quite a bit after plasmapheresis and was discharged. However, a couple days after discharge he began to feel weak again and return to the emergency room. He was recently admitted for recurrent GBS. For that hospital stay he did not require intubation but did have a facemask  oxygen. He also was treated with IVIG the second time around. He improved and was discharged home. He did outpatient physical therapy. He has had a fairly good in movement of his strength and is able to walk independently. However, he has had more dysesthetic pain.   With the exception of more pain, he otherwise feels very close to his pre-GBS baseline.   Of note, he had a flu shot a few weeks before the first episode of GBS.  MRI's from 06/2014 and 07/2014:    Cervical MRI shows spinal stenosis at C4-C5, C5-C6 and C6-C7. There is no spinal cord compression. Thoracic spine shows T8-T9 disc extrusion to the left that does not cause spinal cord compression. There are mild degenerative changes in the lumbar spine. Contrast was not used.   I also reviewed many of the notes and labs from his hospital stay. CSF Protein was greatly elevated at 118 and the CSF white blood cell count was 1.    REVIEW OF  SYSTEMS: Constitutional: No fevers, chills, sweats, or change in appetite.   Has sleepiness Eyes: No visual changes, double vision, eye pain Ear, nose and throat: No hearing loss, ear pain, nasal congestion, sore throat Cardiovascular: No chest pain, palpitations Respiratory: No shortness of breath at rest or with exertion.   No wheezes.  He has OSA GastrointestinaI: No nausea, vomiting, diarrhea, abdominal pain, fecal incontinence Genitourinary: No dysuria, urinary retention or frequency.  No nocturia. Musculoskeletal: No neck pain, back pain Integumentary: No rash, pruritus, skin lesions Neurological: as above Psychiatric: Anxiety and depression at this time.   ADD is stable. Endocrine: No palpitations, diaphoresis, change in appetite, change in weigh or increased thirst Hematologic/Lymphatic: No anemia, purpura, petechiae. Allergic/Immunologic: No itchy/runny eyes, nasal congestion, recent allergic reactions, rashes  ALLERGIES: Allergies  Allergen Reactions  . Codeine Itching    REACTION: Itching  . Metformin And Related Other (See Comments)    GI upset, diarrhea  . Shellfish Allergy     HOME MEDICATIONS:  Current Outpatient Prescriptions:  .  allopurinol (ZYLOPRIM) 300 MG tablet, Take 1 tablet (300 mg total) by mouth daily., Disp: 30 tablet, Rfl: 11 .  amLODipine (NORVASC) 10 MG tablet, Take 1 tablet (10 mg total) by mouth daily., Disp: 90 tablet, Rfl: 3 .  ARIPiprazole (ABILIFY) 10 MG tablet, Take 1 tablet (10 mg total) by mouth daily., Disp: 30 tablet, Rfl: 5 .  aspirin EC 81 MG tablet, Take 1 tablet (81 mg total) by mouth daily., Disp: , Rfl:  .  cyclobenzaprine (FLEXERIL) 5 MG tablet, TAKE 1 TABLET(5 MG) BY MOUTH AT BEDTIME, Disp: 30 tablet, Rfl: 0 .  Dulaglutide (TRULICITY) A999333 [id number] SOPN, 0.5 cc sq per week, Disp: 6 mL, Rfl: 11 .  DULoxetine (CYMBALTA) 30 MG capsule, Take 1 capsule (30 mg total) by mouth 3 (three) times daily., Disp: 270 capsule, Rfl: 3 .   famotidine (PEPCID) 40 MG tablet, TAKE 1 TABLET(40 MG) BY MOUTH TWICE DAILY, Disp: 180 tablet, Rfl: 3 .  glipiZIDE (GLUCOTROL XL) 2.5 MG 24 hr tablet, TAKE 1 TABLET BY MOUTH DAILY WITH BREAKFAST, Disp: 90 tablet, Rfl: 0 .  glipiZIDE (GLUCOTROL) 10 MG tablet, Take 10 mg by mouth daily before breakfast., Disp: , Rfl:  .  hydrochlorothiazide (MICROZIDE) 12.5 MG capsule, TAKE ONE CAPSULE BY MOUTH EVERY DAY, Disp: 90 capsule, Rfl: 0 .  linagliptin (TRADJENTA) 5 MG TABS tablet, Take 1 tablet (5 mg total) by mouth daily., Disp: 90 tablet, Rfl: 3 .  LORazepam (ATIVAN)  0.5 MG tablet, TAKE 1 TABLET BY MOUTH TWICE DAILY, Disp: 60 tablet, Rfl: 2 .  methocarbamol (ROBAXIN) 500 MG tablet, TAKE 1 TABLET(500 MG) BY MOUTH EVERY 8 HOURS AS NEEDED FOR MUSCLE SPASMS, Disp: 90 tablet, Rfl: 0 .  methylphenidate (CONCERTA) 54 MG PO CR tablet, Take 1 tablet (54 mg total) by mouth every morning., Disp: 30 tablet, Rfl: 0 .  pantoprazole (PROTONIX) 40 MG tablet, Take 1 tablet (40 mg total) by mouth daily., Disp: 30 tablet, Rfl: 11 .  pregabalin (LYRICA) 200 MG capsule, TAKE (1) CAPSULE THREE TIMES DAILY., Disp: 270 capsule, Rfl: 1 .  rosuvastatin (CRESTOR) 40 MG tablet, Take 1 tablet (40 mg total) by mouth daily., Disp: 90 tablet, Rfl: 3 .  tapentadol HCl (NUCYNTA) 75 MG tablet, Take 1 tablet (75 mg total) by mouth 3 (three) times daily as needed., Disp: 90 tablet, Rfl: 0  PAST MEDICAL HISTORY: Past Medical History:  Diagnosis Date  . Abdominal pain, left lower quadrant 06/06/2010  . Abdominal pain, unspecified site 01/19/2009  . ADD 10/09/2008  . ALLERGIC RHINITIS 10/09/2008  . ANXIETY DEPRESSION 02/01/2008  . COLONIC POLYPS 02/01/2008  . DIABETES MELLITUS, TYPE II 05/20/2010  . DYSPNEA 03/12/2010  . ERECTILE DYSFUNCTION 10/09/2008  . ERECTILE DYSFUNCTION, ORGANIC 05/20/2010  . FOOT PAIN, LEFT 05/20/2010  . GERD 02/01/2008  . Guillain Barr syndrome ([place])   . HEMORRHOIDS 02/01/2008  . HIATAL HERNIA 02/01/2008  .  HYPERLIPIDEMIA 10/09/2008  . HYPERTENSION 10/09/2008  . MORTON'S NEUROMA 05/20/2010  . Other specified forms of hearing loss 06/27/2009  . PERIPHERAL EDEMA 05/20/2010  . PERIPHERAL NEUROPATHY 05/20/2010  . SLEEP APNEA, OBSTRUCTIVE 02/01/2008  . Stricture and stenosis of esophagus 02/02/2008  . Type II or unspecified type diabetes mellitus without mention of complication, uncontrolled 11/14/2010  . WRIST PAIN, LEFT 12/05/2009    PAST SURGICAL HISTORY: Past Surgical History:  Procedure Laterality Date  . CARPAL TUNNEL RELEASE    . ESOPHAGEAL DILATION  july 2009  . ESOPHAGOGASTRODUODENOSCOPY N/A 06/27/2014   Procedure: ESOPHAGOGASTRODUODENOSCOPY (EGD);  Surgeon: Lafayette Dragon, MD;  Location: [facility];  Service: Endoscopy;  Laterality: N/A;  . EYE SURGERY     catract surgery on both eyes  . ROTATOR CUFF REPAIR      FAMILY HISTORY: Family History  Problem Relation Age of Onset  . Diabetes Mother   . Heart disease Mother   . Hyperlipidemia Mother   . Depression Mother   . Diabetes Brother   . Colon cancer Neg Hx     SOCIAL HISTORY:  Social History   Social History  . Marital status: Single    Spouse name: N/A  . Number of children: N/A  . 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 Smoking status: Never Smoker  . Smokeless tobacco: Never Used  . Alcohol use Yes     Comment: occasional  . Drug use: Yes    Types: Methaqualone  . Sexual activity: Yes     Comment: before I got sick   Other Topics Concern  . Not on file   Social History Narrative  . No narrative on file     PHYSICAL EXAM  Vitals:   10/13/16 1302  BP: (!) 148/93  Pulse: (!) 103  Resp: 18  Weight: 236 lb (107 kg)  Height: 5\' 9"  (1.753 m)    Body mass index is 34.85 kg/m.   General: The patient is well-developed  and well-nourished and in no acute distress  Skin: Extremities are without rash. He has mild pedal edema, slightly  worse on the left.    HEENT:   His pharynx is [place]ampatti 4.  Neurologic Exam  Mental status: The patient is alert and oriented x 3 at the time of the examination. The patient has apparent normal recent and remote memory, with an apparently normal attention span and concentration ability.   Speech is normal.  Cranial nerves: Extraocular movements are full. Facial strength and sensation are normal..  Trapezius and sternocleidomastoid strength is normal. No dysarthria is noted.  The tongue is midline, and the patient has symmetric elevation of the soft palate. No obvious hearing deficits are noted.  Motor:  Muscle bulk is normal.   Tone is normal. Strength is  5 / 5 proximally in all 4 extremities but 4/5 in left APB, 4+/5 right APB and 4 to 4+ in bilateral interossei hand muscles, 4/5 in both EHL muscles  Sensory: Sensory testing is intact in hands upper legs.   He reports decreased vibration sensation at ankles, absent at toes, reduced touch/temp in feet and lower legs up to ankle.  Coordination: Cerebellar testing reveals good finger-nose-finger and reduced heel-to-shin bilaterally.  Gait and station: Station is normal.   Gait is normal. Tandem gait is wide. Romberg is positive.   Reflexes: Deep tendon reflexes are symmetric and 1+ bilaterally at deltoid and trace at knees, absent at ankles.        DIAGNOSTIC DATA (LABS, IMAGING, TESTING) - I reviewed patient records, labs, notes, testing and imaging myself where available.  Lab Results  Component Value Date   WBC 8.8 02/20/2016   HGB 15.1 02/20/2016   HCT 44.9 02/20/2016   MCV 85.5 02/20/2016   PLT 208.0 02/20/2016      Component Value Date/Time   NA 137 02/20/2016 1149   K 4.6 02/20/2016 1149   CL 99 02/20/2016 1149   CO2 27 02/20/2016 1149   GLUCOSE 305 (H) 02/20/2016 1149   BUN 16 02/20/2016 1149   CREATININE 1.12 02/20/2016 1149   CALCIUM 9.6 02/20/2016 1149   PROT 7.8 02/20/2016 1149   PROT 7.0 08/30/2015 1157    ALBUMIN 4.4 02/20/2016 1149   AST 30 02/20/2016 1149   ALT 50 02/20/2016 1149   ALKPHOS 106 02/20/2016 1149   BILITOT 0.6 02/20/2016 1149   GFRNONAA >90 08/06/2014 0240   GFRAA >90 08/06/2014 0240   Lab Results  Component Value Date   CHOL 240 (H) 02/20/2016   HDL 45.10 02/20/2016   LDLCALC 130 (H) 05/04/2014   LDLDIRECT 148.0 02/20/2016   TRIG 343.0 (H) 02/20/2016   CHOLHDL 5 02/20/2016   Lab Results  Component Value Date   HGBA1C 11.4 (H) 02/20/2016   Lab Results  Component Value Date   VITAMINB12 316 08/30/2015   Lab Results  Component Value Date   TSH 1.63 02/20/2016       ASSESSMENT AND PLAN  Polyneuropathy ([place])  Uncontrolled type II diabetes mellitus with polyneuropathy ([facility])  Carpal tunnel syndrome, bilateral  Obstructive sleep apnea  Gait disorder  Dysesthesia    1.  He will continue Nucynta and Lyrica for pain 2.   Continue Cymbalta for pain and mood. Increase Abilify to 10 mg 3.  He has known OSA and has mild to moderate excessive daytime sleepiness (despite stimulants). Additionally he has gained weight this year. I discussed that I would like to do another sleep study and get him started  on CPAP but he feels he would not use CPAP and would not use an oral appliance. She would not want to do ENT surgery at this time.   He is advised to try to lose weight. 4.   He will return to see me in 5 months or sooner if he has new or worsening neurologic symptoms.      Laquan Beier A. [name], MD, PhD [id number], [id number] PM Certified in Neurology, Clinical Neurophysiology, Sleep Medicine, Pain Medicine and Neuroimaging  [facility] [address], [place], [place] [address] [phone]

## 2016-10-13 NOTE — Telephone Encounter (Signed)
i'm not showing that [name] prescribes this med---routing to [name], please advise, thanks

## 2016-10-14 ENCOUNTER — Other Ambulatory Visit: Payer: Self-pay | Admitting: Neurology

## 2016-11-05 ENCOUNTER — Other Ambulatory Visit (INDEPENDENT_AMBULATORY_CARE_PROVIDER_SITE_OTHER): Payer: Medicare Other

## 2016-11-05 ENCOUNTER — Encounter: Payer: Self-pay | Admitting: Internal Medicine

## 2016-11-05 ENCOUNTER — Ambulatory Visit (INDEPENDENT_AMBULATORY_CARE_PROVIDER_SITE_OTHER): Payer: Medicare Other | Admitting: Internal Medicine

## 2016-11-05 VITALS — BP 132/78 | HR 82 | Temp 98.4°F | Ht 69.0 in | Wt 236.0 lb

## 2016-11-05 DIAGNOSIS — E1165 Type 2 diabetes mellitus with hyperglycemia: Secondary | ICD-10-CM

## 2016-11-05 DIAGNOSIS — E1142 Type 2 diabetes mellitus with diabetic polyneuropathy: Secondary | ICD-10-CM

## 2016-11-05 DIAGNOSIS — IMO0002 Reserved for concepts with insufficient information to code with codable children: Secondary | ICD-10-CM

## 2016-11-05 DIAGNOSIS — Z0000 Encounter for general adult medical examination without abnormal findings: Secondary | ICD-10-CM

## 2016-11-05 LAB — BASIC METABOLIC PANEL
BUN: 18 mg/dL (ref 6–23)
CALCIUM: 9.4 mg/dL (ref 8.4–10.5)
CO2: 32 mEq/L (ref 19–32)
CREATININE: 1.09 mg/dL (ref 0.40–1.50)
Chloride: 100 mEq/L (ref 96–112)
GFR: 72.56 mL/min (ref >60.00)
GLUCOSE: 216 mg/dL — AB (ref 70–99)
Potassium: 4.6 mEq/L (ref 3.5–5.1)
Sodium: 137 mEq/L (ref 135–145)

## 2016-11-05 LAB — CBC WITH DIFFERENTIAL/PLATELET
Basophils Absolute: 0 10*3/uL (ref 0.0–0.1)
Basophils Relative: 0.5 % (ref 0.0–3.0)
EOS PCT: 1.1 % (ref 0.0–5.0)
Eosinophils Absolute: 0.1 10*3/uL (ref 0.0–0.7)
HEMATOCRIT: 46.2 % (ref 39.0–52.0)
HEMOGLOBIN: 15.7 g/dL (ref 13.0–17.0)
LYMPHS PCT: 40 % (ref 12.0–46.0)
Lymphs Abs: 3.2 10*3/uL (ref 0.7–4.0)
MCHC: 33.9 g/dL (ref 30.0–36.0)
MCV: 89.6 fl (ref 78.0–100.0)
MONO ABS: 0.6 10*3/uL (ref 0.1–1.0)
Monocytes Relative: 7.6 % (ref 3.0–12.0)
Neutro Abs: 4 10*3/uL (ref 1.4–7.7)
Neutrophils Relative %: 50.8 % (ref 43.0–77.0)
Platelets: 188 10*3/uL (ref 150.0–400.0)
RBC: 5.16 Mil/uL (ref 4.22–5.81)
RDW: 14.3 % (ref 11.5–15.5)
WBC: 7.9 10*3/uL (ref 4.0–10.5)

## 2016-11-05 LAB — HEPATIC FUNCTION PANEL
ALBUMIN: 4.3 g/dL (ref 3.5–5.2)
ALT: 24 U/L (ref 0–53)
AST: 21 U/L (ref 0–37)
Alkaline Phosphatase: 82 U/L (ref 39–117)
BILIRUBIN TOTAL: 0.7 mg/dL (ref 0.2–1.2)
Bilirubin, Direct: 0.1 mg/dL (ref 0.0–0.3)
Total Protein: 7.1 g/dL (ref 6.0–8.3)

## 2016-11-05 LAB — LIPID PANEL
CHOL/HDL RATIO: 3
CHOLESTEROL: 104 mg/dL (ref 0–200)
HDL: 36.8 mg/dL — AB (ref >39.00)
LDL CALC: 29 mg/dL (ref 0–99)
NonHDL: 66.71
TRIGLYCERIDES: 187 mg/dL — AB (ref 0.0–149.0)
VLDL: 37.4 mg/dL (ref 0.0–40.0)

## 2016-11-05 LAB — HEMOGLOBIN A1C: Hgb A1c MFr Bld: 9.1 % — ABNORMAL HIGH (ref 4.6–6.5)

## 2016-11-05 LAB — PSA: PSA: 0.27 ng/mL (ref 0.10–4.00)

## 2016-11-05 LAB — TSH: TSH: 3.06 u[IU]/mL (ref 0.35–4.50)

## 2016-11-05 MED ORDER — GLUCOSE BLOOD VI STRP: ORAL_STRIP | 12 refills | Status: DC

## 2016-11-05 MED ORDER — METHYLPHENIDATE HCL ER (OSM) 54 MG PO TBCR: 54.0000 mg | EXTENDED_RELEASE_TABLET | ORAL | 0 refills | Status: DC

## 2016-11-05 MED ORDER — ONETOUCH ULTRA 2 W/DEVICE KIT: PACK | 0 refills | Status: DC

## 2016-11-05 MED ORDER — LANCETS MISC
11 refills | Status: DC
Start: 2016-11-05 — End: 2017-12-14

## 2016-11-05 MED ORDER — HYDROCHLOROTHIAZIDE 12.5 MG PO CAPS
12.5000 mg | ORAL_CAPSULE | Freq: Every day | ORAL | 3 refills | Status: DC
Start: 2016-11-05 — End: 2018-02-01

## 2016-11-05 NOTE — Assessment & Plan Note (Signed)

## 2016-11-05 NOTE — Progress Notes (Signed)
Subjective:    Patient ID: Frank [name], male    DOB: June 02, 1956, 61 y.o.   MRN: [id number]  HPI  Here for wellness and f/u;  Overall doing ok;  Pt denies Chest pain, worsening SOB, DOE, wheezing, orthopnea, PND, worsening LE edema, palpitations, dizziness or syncope.  Pt denies neurological change such as new headache, facial or extremity weakness.  Pt denies polydipsia, polyuria, or low sugar symptoms. Pt states overall good compliance with treatment and medications, good tolerability, and has been trying to follow appropriate diet.  Pt denies worsening depressive symptoms, suicidal ideation or panic. No fever, night sweats, wt loss, loss of appetite, or other constitutional symptoms.  Pt states good ability with ADL's, has low fall risk, home safety reviewed and adequate, no other significant changes in hearing or vision, and only occasionally active with exercise. Needs glucometer and supplies, and concerta refill.  No other changes to hx.   Past Medical History:  Diagnosis Date  . Abdominal pain, left lower quadrant 06/06/2010  . Abdominal pain, unspecified site 01/19/2009  . ADD 10/09/2008  . ALLERGIC RHINITIS 10/09/2008  . ANXIETY DEPRESSION 02/01/2008  . COLONIC POLYPS 02/01/2008  . DIABETES MELLITUS, TYPE II 05/20/2010  . DYSPNEA 03/12/2010  . ERECTILE DYSFUNCTION 10/09/2008  . ERECTILE DYSFUNCTION, ORGANIC 05/20/2010  . FOOT PAIN, LEFT 05/20/2010  . GERD 02/01/2008  . Guillain Barr syndrome ([place])   . HEMORRHOIDS 02/01/2008  . HIATAL HERNIA 02/01/2008  . HYPERLIPIDEMIA 10/09/2008  . HYPERTENSION 10/09/2008  . MORTON'S NEUROMA 05/20/2010  . Other specified forms of hearing loss 06/27/2009  . PERIPHERAL EDEMA 05/20/2010  . PERIPHERAL NEUROPATHY 05/20/2010  . SLEEP APNEA, OBSTRUCTIVE 02/01/2008  . Stricture and stenosis of esophagus 02/02/2008  . Type II or unspecified type diabetes mellitus without mention of complication, uncontrolled 11/14/2010  . WRIST PAIN, LEFT 12/05/2009   Past  Surgical History:  Procedure Laterality Date  . CARPAL TUNNEL RELEASE    . ESOPHAGEAL DILATION  july 2009  . ESOPHAGOGASTRODUODENOSCOPY N/A 06/27/2014   Procedure: ESOPHAGOGASTRODUODENOSCOPY (EGD);  Surgeon: Lafayette Dragon, MD;  Location: [facility];  Service: Endoscopy;  Laterality: N/A;  . EYE SURGERY     catract surgery on both eyes  . ROTATOR CUFF REPAIR      reports that he has never smoked. He has never used smokeless tobacco. He reports that he drinks alcohol. He reports that he uses drugs, including Methaqualone. family history includes Depression in his mother; Diabetes in his brother and mother; Heart disease in his mother; Hyperlipidemia in his mother. Allergies  Allergen Reactions  . Codeine Itching    REACTION: Itching  . Metformin And Related Other (See Comments)    GI upset, diarrhea  . Shellfish Allergy    Current Outpatient Prescriptions on File Prior to Visit  Medication Sig Dispense Refill  . allopurinol (ZYLOPRIM) 300 MG tablet Take 1 tablet (300 mg total) by mouth daily. 30 tablet 11  . amLODipine (NORVASC) 10 MG tablet Take 1 tablet (10 mg total) by mouth daily. 90 tablet 3  . ARIPiprazole (ABILIFY) 10 MG tablet Take 1 tablet (10 mg total) by mouth daily. 30 tablet 5  . aspirin EC 81 MG tablet Take 1 tablet (81 mg total) by mouth daily.    . cyclobenzaprine (FLEXERIL) 5 MG tablet TAKE 1 TABLET(5 MG) BY MOUTH AT BEDTIME 30 tablet 0  . Dulaglutide (TRULICITY) 8.93 YB/0.1BP SOPN 0.5 cc sq per week 6 mL 11  . DULoxetine (CYMBALTA) 30 MG capsule Take  1 capsule (30 mg total) by mouth 3 (three) times daily. 270 capsule 3  . famotidine (PEPCID) 40 MG tablet TAKE 1 TABLET(40 MG) BY MOUTH TWICE DAILY 180 tablet 3  . glipiZIDE (GLUCOTROL XL) 2.5 MG 24 hr tablet TAKE 1 TABLET BY MOUTH DAILY WITH BREAKFAST 90 tablet 0  . glipiZIDE (GLUCOTROL) 10 MG tablet Take 10 mg by mouth daily before breakfast.    . linagliptin (TRADJENTA) 5 MG TABS tablet Take 1 tablet (5 mg total) by  mouth daily. 90 tablet 3  . LORazepam (ATIVAN) 0.5 MG tablet TAKE 1 TABLET BY MOUTH TWICE DAILY 60 tablet 2  . methocarbamol (ROBAXIN) 500 MG tablet TAKE 1 TABLET(500 MG) BY MOUTH EVERY 8 HOURS AS NEEDED FOR MUSCLE SPASMS 90 tablet 0  . pantoprazole (PROTONIX) 40 MG tablet Take 1 tablet (40 mg total) by mouth daily. 30 tablet 11  . pregabalin (LYRICA) 200 MG capsule TAKE (1) CAPSULE THREE TIMES DAILY. 270 capsule 1  . rosuvastatin (CRESTOR) 40 MG tablet Take 1 tablet (40 mg total) by mouth daily. 90 tablet 3  . tapentadol HCl (NUCYNTA) 75 MG tablet Take 1 tablet (75 mg total) by mouth 3 (three) times daily as needed. 90 tablet 0   No current facility-administered medications on file prior to visit.    Review of Systems  Constitutional: Negative for unusual diaphoresis or night sweats HENT: Negative for ear swelling or discharge Eyes: Negative for worsening visual haziness  Respiratory: Negative for choking and stridor.   Gastrointestinal: Negative for distension or worsening eructation Genitourinary: Negative for retention or change in urine volume.  Musculoskeletal: Negative for other MSK pain or swelling Skin: Negative for color change and worsening wound Neurological: Negative for tremors and numbness other than noted  Psychiatric/Behavioral: Negative for decreased concentration or agitation other than above   All other system neg per pt    Objective:   Physical Exam BP 132/78   Pulse 82   Temp 98.4 F (36.9 C) (Oral)   Ht 5\' 9"  (1.753 m)   Wt 236 lb (107 kg)   SpO2 98%   BMI 34.85 kg/m  VS noted,  Constitutional: Pt appears in no apparent distress HENT: Head: NCAT.  Right Ear: External ear normal.  Left Ear: External ear normal.  Eyes: . Pupils are equal, round, and reactive to light. Conjunctivae and EOM are normal Neck: Normal range of motion. Neck supple.  Cardiovascular: Normal rate and regular rhythm.   Pulmonary/Chest: Effort normal and breath sounds without rales  or wheezing.  Abd:  Soft, NT, ND, + BS Neurological: Pt is alert. Not confused , motor grossly intact Skin: Skin is warm. No rash, no LE edema Psychiatric: Pt behavior is normal. No agitation. flat affect today No other exam findings    Assessment & Plan:

## 2016-11-05 NOTE — Patient Instructions (Signed)

## 2016-11-05 NOTE — Progress Notes (Signed)
Pre visit review using our clinic review tool, if applicable. No additional management support is needed unless otherwise documented below in the visit note. 

## 2016-11-05 NOTE — Assessment & Plan Note (Signed)
Poor control last visit, pt states asympt, good med compliance, for f/u a1c

## 2016-11-06 ENCOUNTER — Encounter: Payer: Self-pay | Admitting: Internal Medicine

## 2016-11-06 ENCOUNTER — Telehealth: Payer: Self-pay

## 2016-11-06 ENCOUNTER — Other Ambulatory Visit: Payer: Self-pay | Admitting: Internal Medicine

## 2016-11-06 MED ORDER — GLIPIZIDE ER 10 MG PO TB24: 10.0000 mg | ORAL_TABLET | Freq: Every day | ORAL | 3 refills | Status: DC

## 2016-11-06 NOTE — Telephone Encounter (Signed)
Called pt, LVM.   

## 2016-11-06 NOTE — Telephone Encounter (Signed)
-----   Message from Biagio Borg, MD sent at 11/06/2016  8:18 AM EDT ----- Left message on MyChart, pt to cont same tx except  The test results show that your current treatment is OK, except the A1c is too high.  After reviewing your medications, we can increase the glipizide ER 2.5 mg to 10 mg to help get better control of the blood sugar.  A new prescription will be sent, and you should be notified from the office. Redmond Baseman to please inform pt, I will do rx

## 2016-11-17 ENCOUNTER — Telehealth: Payer: Self-pay

## 2016-11-17 NOTE — Telephone Encounter (Signed)
Called patient to schedule sleep study 3 times. Have not heard back.

## 2016-12-02 ENCOUNTER — Other Ambulatory Visit: Payer: Self-pay | Admitting: Internal Medicine

## 2016-12-02 NOTE — Telephone Encounter (Signed)
faxed

## 2016-12-02 NOTE — Telephone Encounter (Signed)
Done hardcopy to Shirron  

## 2016-12-16 ENCOUNTER — Other Ambulatory Visit: Payer: Self-pay | Admitting: Internal Medicine

## 2016-12-16 ENCOUNTER — Telehealth: Payer: Self-pay | Admitting: Neurology

## 2016-12-16 MED ORDER — TAPENTADOL HCL 75 MG PO TABS: 75.0000 mg | ORAL_TABLET | Freq: Three times a day (TID) | ORAL | 0 refills | Status: DC | PRN

## 2016-12-16 MED ORDER — METHYLPHENIDATE HCL ER (OSM) 54 MG PO TBCR: 54.0000 mg | EXTENDED_RELEASE_TABLET | ORAL | 0 refills | Status: DC

## 2016-12-16 NOTE — Telephone Encounter (Signed)
Done hardcopy to Shirron  

## 2016-12-16 NOTE — Telephone Encounter (Signed)
Patient called office requesting refill for tapentadol HCl (NUCYNTA) 75 MG tablet.

## 2016-12-16 NOTE — Telephone Encounter (Signed)
faxed

## 2016-12-16 NOTE — Addendum Note (Signed)
Addended by: France Ravens I on: 12/16/2016 01:22 PM   Modules accepted: Orders

## 2016-12-16 NOTE — Telephone Encounter (Signed)
Pt would like a refill of methylphenidate (CONCERTA) 54 MG PO CR tablet

## 2016-12-16 NOTE — Telephone Encounter (Signed)
Rx. up front [facility]/fim 

## 2016-12-16 NOTE — Telephone Encounter (Signed)
Rx. awaiting RAS sig/fim 

## 2016-12-20 ENCOUNTER — Other Ambulatory Visit: Payer: Self-pay | Admitting: Neurology

## 2016-12-23 ENCOUNTER — Telehealth: Payer: Self-pay | Admitting: Neurology

## 2016-12-23 NOTE — Telephone Encounter (Signed)
LMOM--he should have r/f of Lyrica available at his pharmacy.  In March, a 90 day rx. with one additonal r/f was sent in.  He should check with his pharmacy.Hilton Cork

## 2016-12-23 NOTE — Telephone Encounter (Signed)
Patient called office requesting refill for pregabalin (LYRICA) 200 MG capsule.

## 2016-12-25 NOTE — Telephone Encounter (Signed)
Rx's for Abilify and Flexeril have been sent to his pharmacy/fim

## 2016-12-25 NOTE — Telephone Encounter (Addendum)
Patient called and requested a refill on his rx ARIPIPRAZOLE and rx cyclobenzaprine (FLEXERIL) 5 MG tablet.

## 2017-01-15 ENCOUNTER — Other Ambulatory Visit: Payer: Self-pay | Admitting: Internal Medicine

## 2017-01-18 ENCOUNTER — Other Ambulatory Visit: Payer: Self-pay | Admitting: Internal Medicine

## 2017-01-23 ENCOUNTER — Other Ambulatory Visit: Payer: Self-pay | Admitting: Neurology

## 2017-01-28 ENCOUNTER — Telehealth: Payer: Self-pay | Admitting: Internal Medicine

## 2017-01-28 MED ORDER — METHYLPHENIDATE HCL ER (OSM) 54 MG PO TBCR: 54.0000 mg | EXTENDED_RELEASE_TABLET | ORAL | 0 refills | Status: DC

## 2017-01-28 NOTE — Telephone Encounter (Signed)
Done hardcopy to Shirron  

## 2017-01-28 NOTE — Telephone Encounter (Signed)
Pt would like a refill of methylphenidate (CONCERTA) 54 MG PO CR tablet

## 2017-01-29 NOTE — Telephone Encounter (Signed)
faxed

## 2017-02-21 ENCOUNTER — Other Ambulatory Visit: Payer: Self-pay | Admitting: Neurology

## 2017-03-03 ENCOUNTER — Telehealth: Payer: Self-pay | Admitting: *Deleted

## 2017-03-03 ENCOUNTER — Telehealth: Payer: Self-pay | Admitting: Neurology

## 2017-03-03 MED ORDER — TAPENTADOL HCL 75 MG PO TABS: 75.0000 mg | ORAL_TABLET | Freq: Three times a day (TID) | ORAL | 0 refills | Status: DC | PRN

## 2017-03-03 MED ORDER — METHYLPHENIDATE HCL ER (OSM) 54 MG PO TBCR: 54.0000 mg | EXTENDED_RELEASE_TABLET | ORAL | 0 refills | Status: DC

## 2017-03-03 NOTE — Telephone Encounter (Signed)
Patient called office requesting refill for tapentadol HCl (NUCYNTA) 75 MG tablet

## 2017-03-03 NOTE — Telephone Encounter (Signed)
Called pt, LVM informing script was ready Script at front desk

## 2017-03-03 NOTE — Telephone Encounter (Signed)
Shirron, this rx you can not faxed. Pt will need to pick rx up this is a schedule drug...[name]

## 2017-03-03 NOTE — Telephone Encounter (Signed)
Rx. up front [facility]/fim 

## 2017-03-03 NOTE — Telephone Encounter (Signed)
Faxed

## 2017-03-03 NOTE — Addendum Note (Signed)
Addended by: France Ravens I on: 03/03/2017 02:28 PM   Modules accepted: Orders

## 2017-03-03 NOTE — Telephone Encounter (Signed)
Rec'd call pt requesting refill on his Concerta. Check [name] registry last filled 02/02/2017 @ [facility]...[name]

## 2017-03-03 NOTE — Telephone Encounter (Signed)
Rx. awaiting RAS sig/fim 

## 2017-03-03 NOTE — Telephone Encounter (Signed)
One Done hardcopy to [employer]

## 2017-03-05 ENCOUNTER — Other Ambulatory Visit: Payer: Self-pay | Admitting: Internal Medicine

## 2017-03-09 NOTE — Telephone Encounter (Signed)
Check [place] registry last filled 02/04/2017 @ [facility]...[name]

## 2017-03-10 NOTE — Telephone Encounter (Signed)
Faxed

## 2017-03-10 NOTE — Telephone Encounter (Signed)
Done hardcopy to Shirron  

## 2017-03-14 ENCOUNTER — Other Ambulatory Visit: Payer: Self-pay | Admitting: Internal Medicine

## 2017-03-19 ENCOUNTER — Other Ambulatory Visit: Payer: Self-pay | Admitting: Internal Medicine

## 2017-03-24 ENCOUNTER — Other Ambulatory Visit: Payer: Self-pay | Admitting: Neurology

## 2017-04-06 ENCOUNTER — Telehealth: Payer: Self-pay | Admitting: Internal Medicine

## 2017-04-06 NOTE — Telephone Encounter (Signed)
methylphenidate (CONCERTA) 54 MG PO CR tablet   Patient is requesting a refill on this medication. Please advise. Thank you.

## 2017-04-06 NOTE — Telephone Encounter (Signed)
Check [place] registry last filled 03/05/2017...[name]

## 2017-04-07 MED ORDER — METHYLPHENIDATE HCL ER (OSM) 54 MG PO TBCR: 54.0000 mg | EXTENDED_RELEASE_TABLET | ORAL | 0 refills | Status: DC

## 2017-04-07 NOTE — Telephone Encounter (Signed)
Done hardcopy to Shirron  

## 2017-04-07 NOTE — Telephone Encounter (Signed)
Informed pt via VM. Script at front desk.

## 2017-04-20 ENCOUNTER — Ambulatory Visit (INDEPENDENT_AMBULATORY_CARE_PROVIDER_SITE_OTHER): Payer: Medicare Other | Admitting: Neurology

## 2017-04-20 ENCOUNTER — Encounter (INDEPENDENT_AMBULATORY_CARE_PROVIDER_SITE_OTHER): Payer: Self-pay

## 2017-04-20 ENCOUNTER — Encounter: Payer: Self-pay | Admitting: Neurology

## 2017-04-20 VITALS — BP 130/78 | HR 78 | Resp 18 | Ht 69.0 in | Wt 230.5 lb

## 2017-04-20 DIAGNOSIS — F988 Other specified behavioral and emotional disorders with onset usually occurring in childhood and adolescence: Secondary | ICD-10-CM

## 2017-04-20 DIAGNOSIS — G894 Chronic pain syndrome: Secondary | ICD-10-CM | POA: Diagnosis not present

## 2017-04-20 DIAGNOSIS — IMO0002 Reserved for concepts with insufficient information to code with codable children: Secondary | ICD-10-CM

## 2017-04-20 DIAGNOSIS — G5603 Carpal tunnel syndrome, bilateral upper limbs: Secondary | ICD-10-CM | POA: Diagnosis not present

## 2017-04-20 DIAGNOSIS — R208 Other disturbances of skin sensation: Secondary | ICD-10-CM

## 2017-04-20 DIAGNOSIS — G629 Polyneuropathy, unspecified: Secondary | ICD-10-CM | POA: Diagnosis not present

## 2017-04-20 DIAGNOSIS — G4733 Obstructive sleep apnea (adult) (pediatric): Secondary | ICD-10-CM

## 2017-04-20 DIAGNOSIS — F341 Dysthymic disorder: Secondary | ICD-10-CM

## 2017-04-20 DIAGNOSIS — E1142 Type 2 diabetes mellitus with diabetic polyneuropathy: Secondary | ICD-10-CM

## 2017-04-20 DIAGNOSIS — E1165 Type 2 diabetes mellitus with hyperglycemia: Secondary | ICD-10-CM

## 2017-04-20 MED ORDER — TAPENTADOL HCL 75 MG PO TABS: 75.0000 mg | ORAL_TABLET | Freq: Three times a day (TID) | ORAL | 0 refills | Status: DC | PRN

## 2017-04-20 MED ORDER — DULOXETINE HCL 60 MG PO CPEP: 60.0000 mg | ORAL_CAPSULE | Freq: Two times a day (BID) | ORAL | 4 refills | Status: DC

## 2017-04-20 MED ORDER — PREGABALIN 200 MG PO CAPS: ORAL_CAPSULE | ORAL | 1 refills | Status: DC

## 2017-04-20 NOTE — Progress Notes (Signed)
[facility]  PATIENT: Frank [name] DOB: 04-13-1956  _________________________________   HISTORICAL  CHIEF COMPLAINT:  Chief Complaint  Patient presents with  . Polyneuropathy    Denies new or worsening problems/fim  . Sleep Apnea    HISTORY OF PRESENT ILLNESS:  Frank [name] is a 61 year old man with a chronic peripheral neuropathy who had superimposed Guillain Barr syndrome in November 2015.   He has neuropathic pain in the lower legs and also has pain in the hands, worse on the left.  it 10/13/2016  Chronic neuropathic pain:  He feels the leg pain is stable.    Pain is up to the knees and in the hands.   Current pain is treated with Lyrica, Duloxetine and Nucynta.  His polyneuropathy pain started a few years before he was diagnosed with NIDDM .   He had GBS in November 2015 and dysesthesias worsened after that.  He has numbness up to his knees and in both hands.    He stumbles but no falls.     He also notes weakness in his feet and hands.     Mood:   He feels the depression is a little worse than last visit.    He initially did better on combination of Cymbalta and Abilify.    He tolerates both of these medications well.    He also takes lorazepam for anxiety.    CTS:   His left hand pain is unchanged and still bothersome at times.     He has a history of carpal tunnel syndrome.   He had surgery in the past.   He is not wearing the splint much since it did not help.    In the past, carpal tunnel injections have helped a little bit .    OSA:   He was diagnosed with OSA around 2011 but never started CPAP.   He does not want to use CPAP or an oral appliance.   He notes fatigue and sleepiness.   He falls asleep watching TV and in the evenings.         EPWORTH SLEEPINESS SCALE  On a scale of 0 - 3 what is the chance of dozing:  Sitting and Reading:   1 Watching TV:    3 Sitting inactive in a public place: 1 Passenger in car for one hour: 3 Lying down to  rest in the afternoon: 3 Sitting and talking to someone: 0 Sitting quietly after lunch:  3 In a car, stopped in traffic:  0  Total (out of 24):  14/24  (mild EDS)   GBS History:   The initial episode of GBS was treated with plasmapheresis. He reports that the diagnosis took a couple weeks and he had several emergency room visits. Initially he was sent to a skilled nursing facility. He was reporting a lot of back pain alongside the weakness. A lumbar puncture was eventually done showing high protein which led to the dye diagnosis of GBS.  He had severe weakness at the peak and required intubation for respiratory support. At that time, he was unable to move his legs and could barely move his arms. He improved quite a bit after plasmapheresis and was discharged. However, a couple days after discharge he began to feel weak again and return to the emergency room. He was recently admitted for recurrent GBS. For that hospital stay he did not require intubation but did have a facemask oxygen. He also was treated with IVIG the  second time around. He improved and was discharged home. He did outpatient physical therapy. He has had a fairly good in movement of his strength and is able to walk independently. However, he has had more dysesthetic pain.   With the exception of more pain, he otherwise feels very close to his pre-GBS baseline.   Of note, he had a flu shot a few weeks before the first episode of GBS.  MRI's from 06/2014 and 07/2014:    Cervical MRI shows spinal stenosis at C4-C5, C5-C6 and C6-C7. There is no spinal cord compression. Thoracic spine shows T8-T9 disc extrusion to the left that does not cause spinal cord compression. There are mild degenerative changes in the lumbar spine. Contrast was not used.   I also reviewed many of the notes and labs from his hospital stay. CSF Protein was greatly elevated at 118 and the CSF white blood cell count was 1.    REVIEW OF SYSTEMS: Constitutional: No  fevers, chills, sweats, or change in appetite.   Has sleepiness Eyes: No visual changes, double vision, eye pain Ear, nose and throat: No hearing loss, ear pain, nasal congestion, sore throat Cardiovascular: No chest pain, palpitations Respiratory: No shortness of breath at rest or with exertion.   No wheezes.  He has OSA GastrointestinaI: No nausea, vomiting, diarrhea, abdominal pain, fecal incontinence Genitourinary: No dysuria, urinary retention or frequency.  No nocturia. Musculoskeletal: No neck pain, back pain Integumentary: No rash, pruritus, skin lesions Neurological: as above Psychiatric: Anxiety and depression at this time.   ADD is stable. Endocrine: No palpitations, diaphoresis, change in appetite, change in weigh or increased thirst Hematologic/Lymphatic: No anemia, purpura, petechiae. Allergic/Immunologic: No itchy/runny eyes, nasal congestion, recent allergic reactions, rashes  ALLERGIES: Allergies  Allergen Reactions  . Codeine Itching    REACTION: Itching  . Metformin And Related Other (See Comments)    GI upset, diarrhea  . Shellfish Allergy     HOME MEDICATIONS:  Current Outpatient Prescriptions:  .  allopurinol (ZYLOPRIM) 300 MG tablet, Take 1 tablet (300 mg total) by mouth daily., Disp: 30 tablet, Rfl: 11 .  amLODipine (NORVASC) 10 MG tablet, Take 1 tablet (10 mg total) by mouth daily., Disp: 90 tablet, Rfl: 3 .  ARIPiprazole (ABILIFY) 10 MG tablet, Take 1 tablet (10 mg total) by mouth daily., Disp: 30 tablet, Rfl: 5 .  aspirin EC 81 MG tablet, Take 1 tablet (81 mg total) by mouth daily., Disp: , Rfl:  .  Blood Glucose Monitoring Suppl (ONE TOUCH ULTRA 2) w/Device KIT, Use as directed, Disp: 1 each, Rfl: 0 .  cyclobenzaprine (FLEXERIL) 5 MG tablet, TAKE 1 TABLET(5 MG) BY MOUTH AT BEDTIME, Disp: 30 tablet, Rfl: 0 .  DULoxetine (CYMBALTA) 60 MG capsule, Take 1 capsule (60 mg total) by mouth 2 (two) times daily., Disp: 180 capsule, Rfl: 4 .  famotidine  (PEPCID) 40 MG tablet, TAKE 1 TABLET(40 MG) BY MOUTH TWICE DAILY, Disp: 180 tablet, Rfl: 3 .  glipiZIDE (GLUCOTROL XL) 10 MG 24 hr tablet, Take 1 tablet (10 mg total) by mouth daily with breakfast., Disp: 90 tablet, Rfl: 3 .  glipiZIDE (GLUCOTROL XL) 2.5 MG 24 hr tablet, TAKE 1 TABLET BY MOUTH EVERY DAY WITH BREAKFAST, Disp: 90 tablet, Rfl: 2 .  glucose blood (ONE TOUCH ULTRA TEST) test strip, Use as instructed, Disp: 100 each, Rfl: 12 .  hydrochlorothiazide (MICROZIDE) 12.5 MG capsule, Take 1 capsule (12.5 mg total) by mouth daily., Disp: 90 capsule, Rfl: 3 .  Lancets  MISC, Use as directed once daily, Disp: 100 each, Rfl: 11 .  LORazepam (ATIVAN) 0.5 MG tablet, TAKE 1 TABLET BY MOUTH TWICE DAILY, Disp: 60 tablet, Rfl: 2 .  methocarbamol (ROBAXIN) 500 MG tablet, TAKE 1 TABLET(500 MG) BY MOUTH EVERY 8 HOURS AS NEEDED FOR MUSCLE SPASMS, Disp: 90 tablet, Rfl: 0 .  methylphenidate (CONCERTA) 54 MG PO CR tablet, Take 1 tablet (54 mg total) by mouth every morning., Disp: 30 tablet, Rfl: 0 .  pantoprazole (PROTONIX) 40 MG tablet, Take 1 tablet (40 mg total) by mouth daily., Disp: 30 tablet, Rfl: 11 .  pregabalin (LYRICA) 200 MG capsule, TAKE (1) CAPSULE THREE TIMES DAILY., Disp: 270 capsule, Rfl: 1 .  rosuvastatin (CRESTOR) 40 MG tablet, TAKE 1 TABLET(40 MG) BY MOUTH DAILY, Disp: 90 tablet, Rfl: 1 .  tapentadol HCl (NUCYNTA) 75 MG tablet, Take 1 tablet (75 mg total) by mouth 3 (three) times daily as needed., Disp: 90 tablet, Rfl: 0 .  TRADJENTA 5 MG TABS tablet, TAKE 1 TABLET(5 MG) BY MOUTH DAILY, Disp: 90 tablet, Rfl: 0 .  TRULICITY 4.50 TU/8.8KC SOPN, INJECT 0.5 MLS( 1 SYRINGE) SUBCUTANEOUSLY ONCE PER WEEK, Disp: 2 mL, Rfl: 5  PAST MEDICAL HISTORY: Past Medical History:  Diagnosis Date  . Abdominal pain, left lower quadrant 06/06/2010  . Abdominal pain, unspecified site 01/19/2009  . ADD 10/09/2008  . ALLERGIC RHINITIS 10/09/2008  . ANXIETY DEPRESSION 02/01/2008  . COLONIC POLYPS 02/01/2008  . DIABETES  MELLITUS, TYPE II 05/20/2010  . DYSPNEA 03/12/2010  . ERECTILE DYSFUNCTION 10/09/2008  . ERECTILE DYSFUNCTION, ORGANIC 05/20/2010  . FOOT PAIN, LEFT 05/20/2010  . GERD 02/01/2008  . Guillain Barr syndrome ([place])   . HEMORRHOIDS 02/01/2008  . HIATAL HERNIA 02/01/2008  . HYPERLIPIDEMIA 10/09/2008  . HYPERTENSION 10/09/2008  . MORTON'S NEUROMA 05/20/2010  . Other specified forms of hearing loss 06/27/2009  . PERIPHERAL EDEMA 05/20/2010  . PERIPHERAL NEUROPATHY 05/20/2010  . SLEEP APNEA, OBSTRUCTIVE 02/01/2008  . Stricture and stenosis of esophagus 02/02/2008  . Type II or unspecified type diabetes mellitus without mention of complication, uncontrolled 11/14/2010  . WRIST PAIN, LEFT 12/05/2009    PAST SURGICAL HISTORY: Past Surgical History:  Procedure Laterality Date  . CARPAL TUNNEL RELEASE    . ESOPHAGEAL DILATION  july 2009  . ESOPHAGOGASTRODUODENOSCOPY N/A 06/27/2014   Procedure: ESOPHAGOGASTRODUODENOSCOPY (EGD);  Surgeon: Lafayette Dragon, MD;  Location: [facility];  Service: Endoscopy;  Laterality: N/A;  . EYE SURGERY     catract surgery on both eyes  . ROTATOR CUFF REPAIR      FAMILY HISTORY: Family History  Problem Relation Age of Onset  . Diabetes Mother   . Heart disease Mother   . Hyperlipidemia Mother   . Depression Mother   . Diabetes Brother   . Colon cancer Neg Hx     SOCIAL HISTORY:  Social History   Social History  . Marital status: Single    Spouse name: N/A  . Number of children: N/A  . 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 Smoking status: Never Smoker  . Smokeless tobacco: Never Used  . Alcohol use Yes     Comment: occasional  . Drug use: Yes    Types: Methaqualone  . Sexual activity: Yes     Comment: before I got sick   Other Topics Concern  . Not on file   Social History Narrative  . No narrative on  file     PHYSICAL EXAM  Vitals:   04/20/17 1303  BP: 130/78  Pulse: 78    Resp: 18  Weight: 230 lb 8 oz (104.6 kg)  Height: '5\' 9"'  (1.753 m)    Body mass index is 34.04 kg/m.   General: The patient is well-developed and well-nourished and in no acute distress  Skin: Extremities are without rash. He has mild pedal edema, slightly worse on the left.    HEENT:   His pharynx is [place] 4.  Neurologic Exam  Mental status: The patient is alert and oriented x 3 at the time of the examination. The patient has apparent normal recent and remote memory, with an apparently normal attention span and concentration ability.   Speech is normal.  Cranial nerves: Extraocular movements are full. Facial strength and sensation are normal..  Trapezius and sternocleidomastoid strength is normal. No dysarthria is noted.  The tongue is midline, and the patient has symmetric elevation of the soft palate. No obvious hearing deficits are noted.  Motor:  Muscle bulk is normal. Muscle tone is normal. Strength is 5/5 proximally in the arms and legs. Strength is 4/5 in the left APB and 4+/5 in the right APB and 4+/5 in the interosseous hand muscles. Strength is 4/5 in the foot muscles.   Sensory: Sensory testing is intact in hands upper legs.   He reports decreased vibration sensation at ankles, absent at toes, reduced touch/temp in feet and lower legs up to ankle.  Coordination: Cerebellar testing reveals good finger-nose-finger and reduced heel-to-shin bilaterally.  Gait and station: Station is normal.   Gait is normal. Tandem gait is wide. Romberg is positive.   Reflexes: Deep tendon reflexes are symmetric and 1+ bilaterally at deltoid and trace at knees, absent at ankles.        DIAGNOSTIC DATA (LABS, IMAGING, TESTING) - I reviewed patient records, labs, notes, testing and imaging myself where available.  Lab Results  Component Value Date   WBC 7.9 11/05/2016   HGB 15.7 11/05/2016   HCT 46.2 11/05/2016   MCV 89.6 11/05/2016   PLT 188.0 11/05/2016      Component Value  Date/Time   NA 137 11/05/2016 1528   K 4.6 11/05/2016 1528   CL 100 11/05/2016 1528   CO2 32 11/05/2016 1528   GLUCOSE 216 (H) 11/05/2016 1528   BUN 18 11/05/2016 1528   CREATININE 1.09 11/05/2016 1528   CALCIUM 9.4 11/05/2016 1528   PROT 7.1 11/05/2016 1528   PROT 7.0 08/30/2015 1157   ALBUMIN 4.3 11/05/2016 1528   AST 21 11/05/2016 1528   ALT 24 11/05/2016 1528   ALKPHOS 82 11/05/2016 1528   BILITOT 0.7 11/05/2016 1528   GFRNONAA >90 08/06/2014 0240   GFRAA >90 08/06/2014 0240   Lab Results  Component Value Date   CHOL 104 11/05/2016   HDL 36.80 (L) 11/05/2016   LDLCALC 29 11/05/2016   LDLDIRECT 148.0 02/20/2016   TRIG 187.0 (H) 11/05/2016   CHOLHDL 3 11/05/2016   Lab Results  Component Value Date   HGBA1C 9.1 (H) 11/05/2016   Lab Results  Component Value Date   NIOEVOJJ00 938 08/30/2015   Lab Results  Component Value Date   TSH 3.06 11/05/2016       ASSESSMENT AND PLAN  Uncontrolled type II diabetes mellitus with polyneuropathy (HCC)  Diabetic polyneuropathy associated with type 2 diabetes mellitus (HCC)  Polyneuropathy  Carpal tunnel syndrome, bilateral  Attention deficit disorder, unspecified hyperactivity presence  ANXIETY DEPRESSION  Obstructive sleep apnea  Dysesthesia  Chronic pain syndrome    1.  Renew Nucynta and Lyrica for pain. 2.   Increase Cymbalta to 120 mg daily for depression and neuropathic pain and continue Abilify. 3.  He has OSA but does not want to use CPAP or an oral appliance. We discussed trying to lose some more weight.  4.   He will return to see me in 5-6 months or sooner if he has new or worsening neurologic symptoms.      Haide Klinker A. [name], MD, PhD 2/95/5397, 1:41 PM Certified in Neurology, Clinical Neurophysiology, Sleep Medicine, Pain Medicine and Neuroimaging  [facility] [address] [place], [place] [address] [phone]

## 2017-04-27 ENCOUNTER — Other Ambulatory Visit: Payer: Self-pay | Admitting: Neurology

## 2017-05-13 ENCOUNTER — Ambulatory Visit: Payer: Medicare Other | Admitting: Internal Medicine

## 2017-05-19 ENCOUNTER — Telehealth: Payer: Self-pay | Admitting: Internal Medicine

## 2017-05-19 MED ORDER — METHYLPHENIDATE HCL ER (OSM) 54 MG PO TBCR: 54.0000 mg | EXTENDED_RELEASE_TABLET | ORAL | 0 refills | Status: DC

## 2017-05-19 NOTE — Telephone Encounter (Signed)
Pt notified Script at front desk 

## 2017-05-19 NOTE — Telephone Encounter (Signed)
Patient requesting script for concerta.

## 2017-05-19 NOTE — Telephone Encounter (Signed)
Done hardcopy to Shirron  

## 2017-05-25 ENCOUNTER — Other Ambulatory Visit: Payer: Self-pay | Admitting: Internal Medicine

## 2017-05-25 ENCOUNTER — Ambulatory Visit (INDEPENDENT_AMBULATORY_CARE_PROVIDER_SITE_OTHER): Payer: Medicare Other | Admitting: Internal Medicine

## 2017-05-25 ENCOUNTER — Encounter: Payer: Self-pay | Admitting: Internal Medicine

## 2017-05-25 ENCOUNTER — Other Ambulatory Visit (INDEPENDENT_AMBULATORY_CARE_PROVIDER_SITE_OTHER): Payer: Medicare Other

## 2017-05-25 VITALS — BP 124/82 | HR 97 | Temp 98.4°F | Ht 69.0 in | Wt 229.0 lb

## 2017-05-25 DIAGNOSIS — E785 Hyperlipidemia, unspecified: Secondary | ICD-10-CM | POA: Diagnosis not present

## 2017-05-25 DIAGNOSIS — E1142 Type 2 diabetes mellitus with diabetic polyneuropathy: Secondary | ICD-10-CM

## 2017-05-25 DIAGNOSIS — IMO0002 Reserved for concepts with insufficient information to code with codable children: Secondary | ICD-10-CM

## 2017-05-25 DIAGNOSIS — E1165 Type 2 diabetes mellitus with hyperglycemia: Secondary | ICD-10-CM

## 2017-05-25 DIAGNOSIS — Z0000 Encounter for general adult medical examination without abnormal findings: Secondary | ICD-10-CM

## 2017-05-25 DIAGNOSIS — E119 Type 2 diabetes mellitus without complications: Secondary | ICD-10-CM

## 2017-05-25 DIAGNOSIS — I10 Essential (primary) hypertension: Secondary | ICD-10-CM

## 2017-05-25 LAB — LIPID PANEL
CHOL/HDL RATIO: 2
CHOLESTEROL: 98 mg/dL (ref 0–200)
HDL: 40 mg/dL (ref >39.00)
NonHDL: 57.87
TRIGLYCERIDES: 204 mg/dL — AB (ref 0.0–149.0)
VLDL: 40.8 mg/dL — AB (ref 0.0–40.0)

## 2017-05-25 LAB — BASIC METABOLIC PANEL
BUN: 15 mg/dL (ref 6–23)
CHLORIDE: 93 meq/L — AB (ref 96–112)
CO2: 33 mEq/L — ABNORMAL HIGH (ref 19–32)
CREATININE: 1.33 mg/dL (ref 0.40–1.50)
Calcium: 9.4 mg/dL (ref 8.4–10.5)
GFR: 57.57 mL/min — ABNORMAL LOW (ref >60.00)
GLUCOSE: 280 mg/dL — AB (ref 70–99)
POTASSIUM: 3.5 meq/L (ref 3.5–5.1)
Sodium: 136 mEq/L (ref 135–145)

## 2017-05-25 LAB — HEMOGLOBIN A1C: Hgb A1c MFr Bld: 10.8 % — ABNORMAL HIGH (ref 4.6–6.5)

## 2017-05-25 LAB — HEPATIC FUNCTION PANEL
ALBUMIN: 4.3 g/dL (ref 3.5–5.2)
ALT: 31 U/L (ref 0–53)
AST: 27 U/L (ref 0–37)
Alkaline Phosphatase: 100 U/L (ref 39–117)
Bilirubin, Direct: 0.3 mg/dL (ref 0.0–0.3)
TOTAL PROTEIN: 7.4 g/dL (ref 6.0–8.3)
Total Bilirubin: 1.1 mg/dL (ref 0.2–1.2)

## 2017-05-25 LAB — LDL CHOLESTEROL, DIRECT: Direct LDL: 37 mg/dL

## 2017-05-25 MED ORDER — DULAGLUTIDE 1.5 MG/0.5ML ~~LOC~~ SOAJ: SUBCUTANEOUS | 11 refills | Status: DC

## 2017-05-25 NOTE — Telephone Encounter (Signed)
Error

## 2017-05-25 NOTE — Assessment & Plan Note (Signed)
stable overall by history and exam, recent data reviewed with pt, and pt to continue medical treatment as before,  to f/u any worsening symptoms or concerns Lab Results  Component Value Date   LDLCALC 29 11/05/2016

## 2017-05-25 NOTE — Progress Notes (Signed)
Subjective:    Patient ID: Frank [name], male    DOB: [birth year], 61 y.o.   MRN: [id number]  HPI  Here to f/u; overall doing ok,  Pt denies chest pain, increasing sob or doe, wheezing, orthopnea, PND, increased LE swelling, palpitations, dizziness or syncope.  Pt denies new neurological symptoms such as new headache, or facial or extremity weakness or numbness.  Pt denies polydipsia, polyuria, or low sugar episode.  Pt states overall good compliance with meds, mostly trying to follow appropriate diet, with wt overall stable,  but little exercise however.  No new complaints Has not been checking sugars, as he was not sure how to do this after supplies given last visit.  Declines DM education referral for now Past Medical History:  Diagnosis Date  . Abdominal pain, left lower quadrant 06/06/2010  . Abdominal pain, unspecified site 01/19/2009  . ADD 10/09/2008  . ALLERGIC RHINITIS 10/09/2008  . ANXIETY DEPRESSION 02/01/2008  . COLONIC POLYPS 02/01/2008  . DIABETES MELLITUS, TYPE II 05/20/2010  . DYSPNEA 03/12/2010  . ERECTILE DYSFUNCTION 10/09/2008  . ERECTILE DYSFUNCTION, ORGANIC 05/20/2010  . FOOT PAIN, LEFT 05/20/2010  . GERD 02/01/2008  . Guillain Barr syndrome ([place])   . HEMORRHOIDS 02/01/2008  . HIATAL HERNIA 02/01/2008  . HYPERLIPIDEMIA 10/09/2008  . HYPERTENSION 10/09/2008  . MORTON'S NEUROMA 05/20/2010  . Other specified forms of hearing loss 06/27/2009  . PERIPHERAL EDEMA 05/20/2010  . PERIPHERAL NEUROPATHY 05/20/2010  . SLEEP APNEA, OBSTRUCTIVE 02/01/2008  . Stricture and stenosis of esophagus 02/02/2008  . Type II or unspecified type diabetes mellitus without mention of complication, uncontrolled 11/14/2010  . WRIST PAIN, LEFT 12/05/2009   Past Surgical History:  Procedure Laterality Date  . CARPAL TUNNEL RELEASE    . ESOPHAGEAL DILATION  july 2009  . ESOPHAGOGASTRODUODENOSCOPY N/A 06/27/2014   Procedure: ESOPHAGOGASTRODUODENOSCOPY (EGD);  Surgeon: Lafayette Dragon, MD;  Location: [facility];  Service: Endoscopy;  Laterality: N/A;  . EYE SURGERY     catract surgery on both eyes  . ROTATOR CUFF REPAIR      reports that he has never smoked. He has never used smokeless tobacco. He reports that he drinks alcohol. He reports that he uses drugs, including Methaqualone. family history includes Depression in his mother; Diabetes in his brother and mother; Heart disease in his mother; Hyperlipidemia in his mother. Allergies  Allergen Reactions  . Codeine Itching    REACTION: Itching  . Metformin And Related Other (See Comments)    GI upset, diarrhea  . Shellfish Allergy    Current Outpatient Prescriptions on File Prior to Visit  Medication Sig Dispense Refill  . allopurinol (ZYLOPRIM) 300 MG tablet Take 1 tablet (300 mg total) by mouth daily. 30 tablet 11  . amLODipine (NORVASC) 10 MG tablet Take 1 tablet (10 mg total) by mouth daily. 90 tablet 3  . ARIPiprazole (ABILIFY) 10 MG tablet Take 1 tablet (10 mg total) by mouth daily. 30 tablet 5  . aspirin EC 81 MG tablet Take 1 tablet (81 mg total) by mouth daily.    . Blood Glucose Monitoring Suppl (ONE TOUCH ULTRA 2) w/Device KIT Use as directed 1 each 0  . cyclobenzaprine (FLEXERIL) 5 MG tablet TAKE 1 TABLET(5 MG) BY MOUTH AT BEDTIME 30 tablet 5  . DULoxetine (CYMBALTA) 60 MG capsule Take 1 capsule (60 mg total) by mouth 2 (two) times daily. 180 capsule 4  . famotidine (PEPCID) 40 MG tablet TAKE 1 TABLET(40 MG) BY MOUTH TWICE DAILY  180 tablet 3  . glipiZIDE (GLUCOTROL XL) 10 MG 24 hr tablet Take 1 tablet (10 mg total) by mouth daily with breakfast. 90 tablet 3  . glucose blood (ONE TOUCH ULTRA TEST) test strip Use as instructed 100 each 12  . hydrochlorothiazide (MICROZIDE) 12.5 MG capsule Take 1 capsule (12.5 mg total) by mouth daily. 90 capsule 3  . Lancets MISC Use as directed once daily 100 each 11  . LORazepam (ATIVAN) 0.5 MG tablet TAKE 1 TABLET BY MOUTH TWICE DAILY 60 tablet 2  . methocarbamol (ROBAXIN) 500 MG tablet  TAKE 1 TABLET(500 MG) BY MOUTH EVERY 8 HOURS AS NEEDED FOR MUSCLE SPASMS 90 tablet 0  . methylphenidate (CONCERTA) 54 MG PO CR tablet Take 1 tablet (54 mg total) by mouth every morning. 30 tablet 0  . pantoprazole (PROTONIX) 40 MG tablet Take 1 tablet (40 mg total) by mouth daily. 30 tablet 11  . pregabalin (LYRICA) 200 MG capsule TAKE (1) CAPSULE THREE TIMES DAILY. 270 capsule 1  . rosuvastatin (CRESTOR) 40 MG tablet TAKE 1 TABLET(40 MG) BY MOUTH DAILY 90 tablet 1  . tapentadol HCl (NUCYNTA) 75 MG tablet Take 1 tablet (75 mg total) by mouth 3 (three) times daily as needed. 90 tablet 0  . TRADJENTA 5 MG TABS tablet TAKE 1 TABLET(5 MG) BY MOUTH DAILY 90 tablet 0  . TRULICITY 0.99 IP/3.8SN SOPN INJECT 0.5 MLS( 1 SYRINGE) SUBCUTANEOUSLY ONCE PER WEEK 2 mL 5   No current facility-administered medications on file prior to visit.    Review of Systems  Constitutional: Negative for other unusual diaphoresis or sweats HENT: Negative for ear discharge or swelling Eyes: Negative for other worsening visual disturbances Respiratory: Negative for stridor or other swelling  Gastrointestinal: Negative for worsening distension or other blood Genitourinary: Negative for retention or other urinary change Musculoskeletal: Negative for other MSK pain or swelling Skin: Negative for color change or other new lesions Neurological: Negative for worsening tremors and other numbness  Psychiatric/Behavioral: Negative for worsening agitation or other fatigue All other system neg per pt    Objective:   Physical Exam BP 124/82   Pulse 97   Temp 98.4 F (36.9 C) (Oral)   Ht _0  (1.753 m)   Wt 229 lb (103.9 kg)   SpO2 97%   BMI 33.82 kg/m  VS noted, not ill appearing, obese Constitutional: Pt appears in NAD HENT: Head: NCAT.  Right Ear: External ear normal.  Left Ear: External ear normal.  Eyes: . Pupils are equal, round, and reactive to light. Conjunctivae and EOM are normal Nose: without d/c or  deformity Neck: Neck supple. Gross normal ROM Cardiovascular: Normal rate and regular rhythm.   Pulmonary/Chest: Effort normal and breath sounds without rales or wheezing.  Neurological: Pt is alert. At baseline orientation, motor grossly intact Skin: Skin is warm. No rashes, other new lesions, no LE edema Psychiatric: Pt behavior is normal without agitation  No other exam findings Lab Results  Component Value Date   WBC 7.9 11/05/2016   HGB 15.7 11/05/2016   HCT 46.2 11/05/2016   PLT 188.0 11/05/2016   GLUCOSE 216 (H) 11/05/2016   CHOL 104 11/05/2016   TRIG 187.0 (H) 11/05/2016   HDL 36.80 (L) 11/05/2016   LDLDIRECT 148.0 02/20/2016   LDLCALC 29 11/05/2016   ALT 24 11/05/2016   AST 21 11/05/2016   NA 137 11/05/2016   K 4.6 11/05/2016   CL 100 11/05/2016   CREATININE 1.09 11/05/2016   BUN 18  11/05/2016   CO2 32 11/05/2016   TSH 3.06 11/05/2016   PSA 0.27 11/05/2016   HGBA1C 9.1 (H) 11/05/2016   MICROALBUR 5.2 (H) 02/20/2016       Assessment & Plan:

## 2017-05-25 NOTE — Assessment & Plan Note (Addendum)
Likely improved by hx and recent med increase, stable overall by history and exam, recent data reviewed with pt, and pt to continue medical treatment as before,  to f/u any worsening symptoms or concerns, for f/u lab today, and pt to be shown how to do CBG's today Lab Results  Component Value Date   HGBA1C 9.1 (H) 11/05/2016

## 2017-05-25 NOTE — Patient Instructions (Signed)
You are shown how to check the blood sugar today  Please continue all other medications as before, and refills have been done if requested.  Please have the pharmacy call with any other refills you may need.  Please continue your efforts at being more active, low cholesterol diabetic diet, and weight control.  Please keep your appointments with your specialists as you may have planned  Please return in 6 months, or sooner if needed, with Lab testing done 3-5 days before

## 2017-05-25 NOTE — Assessment & Plan Note (Signed)
stable overall by history and exam, recent data reviewed with pt, and pt to continue medical treatment as before,  to f/u any worsening symptoms or concerns BP Readings from Last 3 Encounters:  05/25/17 124/82  04/20/17 130/78  11/05/16 132/78

## 2017-05-26 ENCOUNTER — Ambulatory Visit: Payer: Medicare Other | Admitting: Internal Medicine

## 2017-05-27 ENCOUNTER — Telehealth: Payer: Self-pay

## 2017-05-27 NOTE — Telephone Encounter (Signed)
-----   Message from Biagio Borg, MD sent at 05/25/2017  5:37 PM EDT ----- Letter sent, cont same tx except  The test results show that your current treatment is OK, except the A1c is still quite high.  We need to increase the trulicity as we discussed at your visit, but also Refer to Endocrinology to help get the sugar under better control  You should also hear from the office about this.  Shirron to please inform pt, I will do rx and referral
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
## 2017-05-27 NOTE — Telephone Encounter (Signed)
Tried multiple attempts to contact pt at the [phone] listed number but received a "busy signal". I called pt's significant other, Danne Baxter, LVM to have pt call the office to discuss lab results.

## 2017-06-14 ENCOUNTER — Other Ambulatory Visit: Payer: Self-pay | Admitting: Neurology

## 2017-06-16 ENCOUNTER — Other Ambulatory Visit: Payer: Self-pay | Admitting: Neurology

## 2017-06-16 ENCOUNTER — Other Ambulatory Visit: Payer: Self-pay | Admitting: Internal Medicine

## 2017-06-16 NOTE — Telephone Encounter (Signed)
Done hardcopy to Shirron  

## 2017-06-16 NOTE — Telephone Encounter (Signed)
Faxed

## 2017-06-19 ENCOUNTER — Other Ambulatory Visit: Payer: Self-pay | Admitting: Internal Medicine

## 2017-06-23 ENCOUNTER — Telehealth: Payer: Self-pay | Admitting: Internal Medicine

## 2017-06-23 MED ORDER — METHYLPHENIDATE HCL ER (OSM) 54 MG PO TBCR: 54.0000 mg | EXTENDED_RELEASE_TABLET | ORAL | 0 refills | Status: DC

## 2017-06-23 NOTE — Telephone Encounter (Signed)
Pt called in an need a refill for methylphenidate (CONCERTA) 54 MG PO CR tablet [[id number]]    He would like to be called once it is ready for pick up

## 2017-06-23 NOTE — Telephone Encounter (Signed)
Done hardcopy to Shirron  

## 2017-06-24 NOTE — Telephone Encounter (Signed)
Pt's husband notified Script at front desk

## 2017-07-22 ENCOUNTER — Telehealth: Payer: Self-pay | Admitting: Neurology

## 2017-07-22 MED ORDER — ARIPIPRAZOLE 10 MG PO TABS: 10.0000 mg | ORAL_TABLET | Freq: Every day | ORAL | 0 refills | Status: DC

## 2017-07-22 MED ORDER — TAPENTADOL HCL 75 MG PO TABS: 75.0000 mg | ORAL_TABLET | Freq: Three times a day (TID) | ORAL | 0 refills | Status: DC | PRN

## 2017-07-22 NOTE — Telephone Encounter (Signed)
Pt request refill for tapentadol HCl (NUCYNTA) 75 MG tablet. Pt is requesting refill for ARIPiprazole (ABILIFY) 10 MG tablet 90 days supply sent to [facility]

## 2017-07-22 NOTE — Telephone Encounter (Signed)
Abilify escribed to pharmacy, Nucynta up front [facility]/fim

## 2017-07-27 ENCOUNTER — Telehealth: Payer: Self-pay | Admitting: Internal Medicine

## 2017-07-27 MED ORDER — METHYLPHENIDATE HCL ER (OSM) 54 MG PO TBCR
54.0000 mg | EXTENDED_RELEASE_TABLET | ORAL | 0 refills | Status: DC
Start: 2017-07-27 — End: 2017-09-07

## 2017-07-27 NOTE — Telephone Encounter (Signed)
Done erx 

## 2017-07-27 NOTE — Telephone Encounter (Signed)
Copied from [place]. Topic: Quick Communication - Rx Refill/Question >> Jul 27, 2017  8:44 AM Frank [name] wrote: Pt request refill methylphenidate (CONCERTA) 54 MG PO CR tablet

## 2017-07-27 NOTE — Telephone Encounter (Signed)
Notified pt MD sent rx to [facility] 

## 2017-08-23 ENCOUNTER — Other Ambulatory Visit: Payer: Self-pay | Admitting: Neurology

## 2017-09-02 ENCOUNTER — Encounter: Payer: Self-pay | Admitting: Endocrinology

## 2017-09-07 ENCOUNTER — Telehealth: Payer: Self-pay | Admitting: Internal Medicine

## 2017-09-07 MED ORDER — METHYLPHENIDATE HCL ER (OSM) 54 MG PO TBCR: 54.0000 mg | EXTENDED_RELEASE_TABLET | ORAL | 0 refills | Status: DC

## 2017-09-07 NOTE — Telephone Encounter (Signed)
Copied from [place] [phone]. Topic: Quick Communication - Rx Refill/Question >> Sep 07, 2017  2:33 PM Lolita Rieger, [place] wrote: Medication: [id number]    Has the patient contacted their pharmacy? yes  (Agent: If no, request that the patient contact the pharmacy for the refill.)   Preferred Pharmacy (with phone number or street name): [facility] on [address]: Please be advised that RX refills may take up to 3 business days. We ask that you follow-up with your pharmacy.

## 2017-09-07 NOTE — Telephone Encounter (Signed)
Done erx 

## 2017-09-08 NOTE — Telephone Encounter (Signed)
Called pt no answer LMOM rx sent to [facility].../lmb 

## 2017-09-14 ENCOUNTER — Telehealth: Payer: Self-pay | Admitting: Neurology

## 2017-09-14 ENCOUNTER — Other Ambulatory Visit: Payer: Self-pay | Admitting: Neurology

## 2017-09-14 ENCOUNTER — Other Ambulatory Visit: Payer: Self-pay | Admitting: Internal Medicine

## 2017-09-14 MED ORDER — TAPENTADOL HCL 75 MG PO TABS: 75.0000 mg | ORAL_TABLET | Freq: Three times a day (TID) | ORAL | 0 refills | Status: DC | PRN

## 2017-09-14 NOTE — Telephone Encounter (Signed)
Pt calling for written prescription for tapentadol HCl (NUCYNTA) 75 MG tablet

## 2017-09-14 NOTE — Telephone Encounter (Signed)
Nucynta rx. up front [facility]/fim

## 2017-09-14 NOTE — Telephone Encounter (Signed)
Nucynta rx. awaiting RAS sig/fim

## 2017-09-14 NOTE — Telephone Encounter (Signed)
Done erx 

## 2017-09-14 NOTE — Addendum Note (Signed)
Addended by: France Ravens I on: 09/14/2017 09:33 AM   Modules accepted: Orders

## 2017-10-05 ENCOUNTER — Ambulatory Visit: Payer: Medicare Other | Admitting: Endocrinology

## 2017-10-05 ENCOUNTER — Encounter: Payer: Self-pay | Admitting: Endocrinology

## 2017-10-05 ENCOUNTER — Telehealth: Payer: Self-pay | Admitting: Endocrinology

## 2017-10-05 VITALS — BP 136/68 | HR 99 | Ht 69.0 in | Wt 224.6 lb

## 2017-10-05 DIAGNOSIS — IMO0002 Reserved for concepts with insufficient information to code with codable children: Secondary | ICD-10-CM

## 2017-10-05 DIAGNOSIS — E1165 Type 2 diabetes mellitus with hyperglycemia: Secondary | ICD-10-CM | POA: Diagnosis not present

## 2017-10-05 DIAGNOSIS — E1142 Type 2 diabetes mellitus with diabetic polyneuropathy: Secondary | ICD-10-CM

## 2017-10-05 LAB — POCT GLYCOSYLATED HEMOGLOBIN (HGB A1C): HEMOGLOBIN A1C: 10.2

## 2017-10-05 MED ORDER — BASAGLAR KWIKPEN 100 UNIT/ML ~~LOC~~ SOPN: 10.0000 [IU] | PEN_INJECTOR | SUBCUTANEOUS | 11 refills | Status: DC

## 2017-10-05 MED ORDER — INSULIN DEGLUDEC 100 UNIT/ML ~~LOC~~ SOPN: 10.0000 [IU] | PEN_INJECTOR | Freq: Every day | SUBCUTANEOUS | 11 refills | Status: DC

## 2017-10-05 NOTE — Telephone Encounter (Signed)
Please advise on below  

## 2017-10-05 NOTE — Telephone Encounter (Signed)
[facility] called about prescription that was sent over for Insulin Glargine (BASAGLAR KWIKPEN) 100 UNIT/ML SOPN  Patients insurance will not cover this but will cover Levemir , Tradjenta or [employer]   [place], [place] - [place]

## 2017-10-05 NOTE — Patient Instructions (Addendum)
good diet and exercise significantly improve the control of your diabetes.  please let me know if you wish to be referred to a dietician.  high blood sugar is very risky to your health.  you should see an eye doctor and dentist every year.  It is very important to get all recommended vaccinations.  Controlling your blood pressure and cholesterol drastically reduces the damage diabetes does to your body.  Those who smoke should quit.  Please discuss these with your doctor.  check your blood sugar once a day.  vary the time of day when you check, between before the 3 meals, and at bedtime.  also check if you have symptoms of your blood sugar being too high or too low.  please keep a record of the readings and bring it to your next appointment here (or you can bring the meter itself).  You can write it on any piece of paper.  please call us sooner if your blood sugar goes below 70, or if you have a lot of readings over 200. I have sent a prescription to your pharmacy, to add "basaglar," 10 units each morning.  Please come back for a follow-up appointment in 2-3 weeks.  Please see Vaughan Basta, the same day.

## 2017-10-05 NOTE — Telephone Encounter (Signed)
I changed to tresiba, and sent rx

## 2017-10-05 NOTE — Progress Notes (Signed)
Subjective:    Patient ID: Frank [name], male    DOB: [birth year], 62 y.o.   MRN: [id number]  HPI pt is referred by [name], for diabetes.  DM was dx'ed in 2012; he has moderate polyneuropathy of the lower extremities, and associated renal insuff; he has never been on insulin; pt says his diet and exercise are poor; he has never had pancreatitis, pancreatic surgery, severe hypoglycemia or DKA.  He takes trulicity and 2 oral meds.  He has never checked cbg.  He did not tolerate metformin-XR (GI sxs).  Past Medical History:  Diagnosis Date  . Abdominal pain, left lower quadrant 06/06/2010  . Abdominal pain, unspecified site 01/19/2009  . ADD 10/09/2008  . ALLERGIC RHINITIS 10/09/2008  . ANXIETY DEPRESSION 02/01/2008  . COLONIC POLYPS 02/01/2008  . DIABETES MELLITUS, TYPE II 05/20/2010  . DYSPNEA 03/12/2010  . ERECTILE DYSFUNCTION 10/09/2008  . ERECTILE DYSFUNCTION, ORGANIC 05/20/2010  . FOOT PAIN, LEFT 05/20/2010  . GERD 02/01/2008  . Guillain Barr syndrome ([place])   . HEMORRHOIDS 02/01/2008  . HIATAL HERNIA 02/01/2008  . HYPERLIPIDEMIA 10/09/2008  . HYPERTENSION 10/09/2008  . MORTON'S NEUROMA 05/20/2010  . Other specified forms of hearing loss 06/27/2009  . PERIPHERAL EDEMA 05/20/2010  . PERIPHERAL NEUROPATHY 05/20/2010  . SLEEP APNEA, OBSTRUCTIVE 02/01/2008  . Stricture and stenosis of esophagus 02/02/2008  . Type II or unspecified type diabetes mellitus without mention of complication, uncontrolled 11/14/2010  . WRIST PAIN, LEFT 12/05/2009    Past Surgical History:  Procedure Laterality Date  . CARPAL TUNNEL RELEASE    . ESOPHAGEAL DILATION  july 2009  . ESOPHAGOGASTRODUODENOSCOPY N/A 06/27/2014   Procedure: ESOPHAGOGASTRODUODENOSCOPY (EGD);  Surgeon: Lafayette Dragon, MD;  Location: [facility];  Service: Endoscopy;  Laterality: N/A;  . EYE SURGERY     catract surgery on both eyes  . ROTATOR CUFF REPAIR      Social History   Socioeconomic History  . Marital status: Single    Spouse  name: Not on file  . Number of children: Not on file  . Years of education: Not on file  . Highest education level: Not on file  Social Needs  . Financial resource strain: Not on file  . Food insecurity - worry: Not on file  . Food insecurity - inability: Not on file  . Transportation needs - medical: Not on file  . Transportation needs - non-medical: Not on file  Occupational History  . Occupation: Musician: [employer]  Tobacco Use  . Smoking status: Never Smoker  . Smokeless tobacco: Never Used  Substance and Sexual Activity  . Alcohol use: Yes    Comment: occasional  . Drug use: Yes    Types: Methaqualone  . Sexual activity: Yes    Comment: before I got sick  Other Topics Concern  . Not on file  Social History Narrative  . Not on file    Current Outpatient Medications on File Prior to Visit  Medication Sig Dispense Refill  . ARIPiprazole (ABILIFY) 10 MG tablet TAKE 1 TABLET(10 MG) BY MOUTH DAILY 30 tablet 0  . aspirin EC 81 MG tablet Take 1 tablet (81 mg total) by mouth daily.    . Blood Glucose Monitoring Suppl (ONE TOUCH ULTRA 2) w/Device KIT Use as directed 1 each 0  . cyclobenzaprine (FLEXERIL) 5 MG tablet TAKE 1 TABLET(5 MG) BY MOUTH AT BEDTIME 30 tablet 5  . Dulaglutide (TRULICITY) 1.5 LK/4.4WN SOPN 0.5 ml weekly subcutaneous 6  mL 11  . DULoxetine (CYMBALTA) 60 MG capsule Take 1 capsule (60 mg total) by mouth 2 (two) times daily. 180 capsule 4  . famotidine (PEPCID) 40 MG tablet TAKE 1 TABLET(40 MG) BY MOUTH TWICE DAILY 180 tablet 3  . glipiZIDE (GLUCOTROL XL) 2.5 MG 24 hr tablet Take 2.5 mg by mouth daily with breakfast.    . glucose blood (ONE TOUCH ULTRA TEST) test strip Use as instructed 100 each 12  . hydrochlorothiazide (MICROZIDE) 12.5 MG capsule Take 1 capsule (12.5 mg total) by mouth daily. 90 capsule 3  . Lancets MISC Use as directed once daily 100 each 11  . LORazepam (ATIVAN) 0.5 MG tablet TAKE 1 TABLET BY MOUTH TWICE DAILY 60 tablet 2  .  methylphenidate (CONCERTA) 54 MG PO CR tablet Take 1 tablet (54 mg total) by mouth every morning. 30 tablet 0  . pregabalin (LYRICA) 200 MG capsule TAKE (1) CAPSULE THREE TIMES DAILY. 270 capsule 1  . rosuvastatin (CRESTOR) 40 MG tablet TAKE 1 TABLET(40 MG) BY MOUTH DAILY 90 tablet 2  . tapentadol HCl (NUCYNTA) 75 MG tablet Take 1 tablet (75 mg total) by mouth 3 (three) times daily as needed. 90 tablet 0  . TRADJENTA 5 MG TABS tablet TAKE 1 TABLET(5 MG) BY MOUTH DAILY 90 tablet 3   No current facility-administered medications on file prior to visit.     Allergies  Allergen Reactions  . Codeine Itching    REACTION: Itching  . Metformin And Related Other (See Comments)    GI upset, diarrhea  . Shellfish Allergy     Family History  Problem Relation Age of Onset  . Diabetes Mother   . Heart disease Mother   . Hyperlipidemia Mother   . Depression Mother   . Diabetes Brother   . Colon cancer Neg Hx     BP 136/68 (BP Location: Left Arm, Patient Position: Sitting, Cuff Size: Normal)   Pulse 99   Ht 5' 9" (1.753 m)   Wt 224 lb 9.6 oz (101.9 kg)   SpO2 96%   BMI 33.17 kg/m    Review of Systems denies blurry vision, headache, chest pain, sob, n/v, urinary frequency, muscle cramps, excessive diaphoresis, memory loss, depression, cold intolerance, rhinorrhea, and easy bruising. He has recently lost 10 lbs, due to his efforts.     Objective:   Physical Exam VS: see vs page GEN: no distress HEAD: head: no deformity eyes: no periorbital swelling, no proptosis external nose and ears are normal mouth: no lesion seen NECK: supple, thyroid is not enlarged CHEST WALL: no deformity LUNGS: clear to auscultation CV: reg rate and rhythm, no murmur ABD: abdomen is soft, nontender.  no hepatosplenomegaly.  not distended.  Self-reducing ventral hernia MUSCULOSKELETAL: muscle bulk and strength are grossly normal.  no obvious joint swelling.  gait is normal and steady EXTEMITIES: no  deformity.  no ulcer on the feet.  feet are of normal color and temp.  no edema PULSES: dorsalis pedis intact bilat.  no carotid bruit NEURO:  cn 2-12 grossly intact.   readily moves all 4's.  sensation is intact to touch on the feet, but decreased from normal.   SKIN:  Normal texture and temperature.  No rash or suspicious lesion is visible.   NODES:  None palpable at the neck PSYCH: alert, well-oriented.  Does not appear anxious nor depressed.     Lab Results  Component Value Date   HGBA1C 10.2 10/05/2017   Lab Results  Component  Value Date   CREATININE 1.33 05/25/2017   BUN 15 05/25/2017   NA 136 05/25/2017   K 3.5 05/25/2017   CL 93 (L) 05/25/2017   CO2 33 (H) 05/25/2017   I have reviewed outside records, and summarized: Pt was noted to have elevated a1c, and referred here.  He was noted to be taking meds as rx'ed, but was noncompliant with cbg monitoring  I personally reviewed electrocardiogram tracing (08/01/14): Indication: sob Impression: NSR.  No MI.  NS-IVCD Compared to 05/17/14: no significant change      Assessment & Plan:  Type 2 DM, with renal insuff.  Severe exacerbation.  He has failed non-insulin rx.  We discussed.  He agrees to take insulin.  Noncompliance with cbg's.  He is not a candidate for multiple daily injections.  We may have to titrate insulin based on a1c.  Patient Instructions  good diet and exercise significantly improve the control of your diabetes.  please let me know if you wish to be referred to a dietician.  high blood sugar is very risky to your health.  you should see an eye doctor and dentist every year.  It is very important to get all recommended vaccinations.  Controlling your blood pressure and cholesterol drastically reduces the damage diabetes does to your body.  Those who smoke should quit.  Please discuss these with your doctor.  check your blood sugar once a day.  vary the time of day when you check, between before the 3 meals, and  at bedtime.  also check if you have symptoms of your blood sugar being too high or too low.  please keep a record of the readings and bring it to your next appointment here (or you can bring the meter itself).  You can write it on any piece of paper.  please call us sooner if your blood sugar goes below 70, or if you have a lot of readings over 200. I have sent a prescription to your pharmacy, to add "basaglar," 10 units each morning.  Please come back for a follow-up appointment in 2-3 weeks.  Please see Vaughan Basta, the same day.

## 2017-10-06 ENCOUNTER — Telehealth: Payer: Self-pay

## 2017-10-06 NOTE — Telephone Encounter (Signed)
error 

## 2017-10-14 ENCOUNTER — Other Ambulatory Visit: Payer: Self-pay | Admitting: Neurology

## 2017-10-19 ENCOUNTER — Other Ambulatory Visit: Payer: Self-pay

## 2017-10-19 ENCOUNTER — Other Ambulatory Visit: Payer: Self-pay | Admitting: Neurology

## 2017-10-19 ENCOUNTER — Ambulatory Visit: Payer: Medicare Other | Admitting: Neurology

## 2017-10-19 ENCOUNTER — Encounter: Payer: Self-pay | Admitting: Neurology

## 2017-10-19 VITALS — BP 114/78 | HR 97 | Resp 18 | Ht 69.0 in | Wt 229.0 lb

## 2017-10-19 DIAGNOSIS — G4733 Obstructive sleep apnea (adult) (pediatric): Secondary | ICD-10-CM

## 2017-10-19 DIAGNOSIS — R208 Other disturbances of skin sensation: Secondary | ICD-10-CM

## 2017-10-19 DIAGNOSIS — F341 Dysthymic disorder: Secondary | ICD-10-CM | POA: Diagnosis not present

## 2017-10-19 DIAGNOSIS — G4719 Other hypersomnia: Secondary | ICD-10-CM | POA: Diagnosis not present

## 2017-10-19 DIAGNOSIS — G894 Chronic pain syndrome: Secondary | ICD-10-CM

## 2017-10-19 DIAGNOSIS — G5603 Carpal tunnel syndrome, bilateral upper limbs: Secondary | ICD-10-CM | POA: Diagnosis not present

## 2017-10-19 DIAGNOSIS — E1142 Type 2 diabetes mellitus with diabetic polyneuropathy: Secondary | ICD-10-CM

## 2017-10-19 MED ORDER — ARIPIPRAZOLE 10 MG PO TABS
10.0000 mg | ORAL_TABLET | Freq: Every day | ORAL | 3 refills | Status: DC
Start: 2017-10-19 — End: 2018-10-19

## 2017-10-19 MED ORDER — DULOXETINE HCL 60 MG PO CPEP: 60.0000 mg | ORAL_CAPSULE | Freq: Two times a day (BID) | ORAL | 4 refills | Status: DC

## 2017-10-19 MED ORDER — PREGABALIN 200 MG PO CAPS: ORAL_CAPSULE | ORAL | 1 refills | Status: DC

## 2017-10-19 MED ORDER — TAPENTADOL HCL 75 MG PO TABS: 75.0000 mg | ORAL_TABLET | Freq: Three times a day (TID) | ORAL | 0 refills | Status: DC | PRN

## 2017-10-19 NOTE — Progress Notes (Signed)
[facility]  PATIENT: Frank [name] DOB: Mar 09, 1956  _________________________________   HISTORICAL  CHIEF COMPLAINT:  Chief Complaint  Patient presents with  . Peripheral Neuropathy    Sts. pain in bilat feet, legs up to knees, hands is about the same/fim    HISTORY OF PRESENT ILLNESS:  Frank [name] is a 62 year old man with a chronic peripheral neuropathy who had superimposed Guillain Barr syndrome in November 2015.   He has neuropathic pain in the lower legs and also has pain in the hands, worse on the left.    From 10/19/2017: He feels the neuropathic dysesthetic pain is stable.   Lyrica and Nucynta and Duloxetine have helped.    He tolerates them well.    He has not escalated Nucynta use or shown drug seeking behavior.    The [place] was reviewed and is appropriate.     The foot numbness is the same.    His CTS hand pain is about the same and bothers him every day.    Surgery and injections had not helped much.     His OSA remains untreated by his choice.   He does not think he could use CPAP or an oral appliance.   He dozes off some evenings but not everyday (Epworth is high at 14/24).   He has lost 10 pounds since starting insulin.    His depression seems to be ok on Cymbalta and Abilify.    Anxiety is mild on these med's.       From 9/10/20198: Chronic neuropathic pain:  He feels the leg pain is stable.    Pain is up to the knees and in the hands.   Current pain is treated with Lyrica, Duloxetine and Nucynta.  His polyneuropathy pain started a few years before he was diagnosed with NIDDM .   He had GBS in November 2015 and dysesthesias worsened after that.  He has numbness up to his knees and in both hands.    He stumbles but no falls.     He also notes weakness in his feet and hands.     Mood:   He feels the depression is a little worse than last visit.    He initially did better on combination of Cymbalta and Abilify.    He tolerates both of these  medications well.    He also takes lorazepam for anxiety.    CTS:   His left hand pain is unchanged and still bothersome at times.     He has a history of carpal tunnel syndrome.   He had surgery in the past.   He is not wearing the splint much since it did not help.    In the past, carpal tunnel injections have helped a little bit .    OSA:   He was diagnosed with OSA around 2011 but never started CPAP.   He does not want to use CPAP or an oral appliance.   He notes fatigue and sleepiness.   He falls asleep watching TV and in the evenings.         EPWORTH SLEEPINESS SCALE  On a scale of 0 - 3 what is the chance of dozing:  Sitting and Reading:   1 Watching TV:    3 Sitting inactive in a public place: 1 Passenger in car for one hour: 3 Lying down to rest in the afternoon: 3 Sitting and talking to someone: 0 Sitting quietly after lunch:  3 In a  car, stopped in traffic:  0  Total (out of 24):  14/24  (mild EDS)   GBS History:   The initial episode of GBS was treated with plasmapheresis. He reports that the diagnosis took a couple weeks and he had several emergency room visits. Initially he was sent to a skilled nursing facility. He was reporting a lot of back pain alongside the weakness. A lumbar puncture was eventually done showing high protein which led to the dye diagnosis of GBS.  He had severe weakness at the peak and required intubation for respiratory support. At that time, he was unable to move his legs and could barely move his arms. He improved quite a bit after plasmapheresis and was discharged. However, a couple days after discharge he began to feel weak again and return to the emergency room. He was recently admitted for recurrent GBS. For that hospital stay he did not require intubation but did have a facemask oxygen. He also was treated with IVIG the second time around. He improved and was discharged home. He did outpatient physical therapy. He has had a fairly good in movement of  his strength and is able to walk independently. However, he has had more dysesthetic pain.   With the exception of more pain, he otherwise feels very close to his pre-GBS baseline.   Of note, he had a flu shot a few weeks before the first episode of GBS.  MRI's from 06/2014 and 07/2014:    Cervical MRI shows spinal stenosis at C4-C5, C5-C6 and C6-C7. There is no spinal cord compression. Thoracic spine shows T8-T9 disc extrusion to the left that does not cause spinal cord compression. There are mild degenerative changes in the lumbar spine. Contrast was not used.   I also reviewed many of the notes and labs from his hospital stay. CSF Protein was greatly elevated at 118 and the CSF white blood cell count was 1.    REVIEW OF SYSTEMS: Constitutional: No fevers, chills, sweats, or change in appetite.   Has sleepiness Eyes: No visual changes, double vision, eye pain Ear, nose and throat: No hearing loss, ear pain, nasal congestion, sore throat Cardiovascular: No chest pain, palpitations Respiratory: No shortness of breath at rest or with exertion.   No wheezes.  He has OSA GastrointestinaI: No nausea, vomiting, diarrhea, abdominal pain, fecal incontinence Genitourinary: No dysuria, urinary retention or frequency.  No nocturia. Musculoskeletal: No neck pain, back pain Integumentary: No rash, pruritus, skin lesions Neurological: as above Psychiatric: Anxiety and depression at this time.   ADD is stable. Endocrine: No palpitations, diaphoresis, change in appetite, change in weigh or increased thirst Hematologic/Lymphatic: No anemia, purpura, petechiae. Allergic/Immunologic: No itchy/runny eyes, nasal congestion, recent allergic reactions, rashes  ALLERGIES: Allergies  Allergen Reactions  . Codeine Itching    REACTION: Itching  . Metformin And Related Other (See Comments)    GI upset, diarrhea  . Shellfish Allergy     HOME MEDICATIONS:  Current Outpatient Medications:  .  ARIPiprazole  (ABILIFY) 10 MG tablet, TAKE 1 TABLET(10 MG) BY MOUTH DAILY, Disp: 30 tablet, Rfl: 0 .  aspirin EC 81 MG tablet, Take 1 tablet (81 mg total) by mouth daily., Disp: , Rfl:  .  Blood Glucose Monitoring Suppl (ONE TOUCH ULTRA 2) w/Device KIT, Use as directed, Disp: 1 each, Rfl: 0 .  cyclobenzaprine (FLEXERIL) 5 MG tablet, TAKE 1 TABLET(5 MG) BY MOUTH AT BEDTIME, Disp: 30 tablet, Rfl: 5 .  Dulaglutide (TRULICITY) 1.5 TD/9.7CB SOPN, 0.5 ml  weekly subcutaneous, Disp: 6 mL, Rfl: 11 .  DULoxetine (CYMBALTA) 60 MG capsule, Take 1 capsule (60 mg total) by mouth 2 (two) times daily., Disp: 180 capsule, Rfl: 4 .  famotidine (PEPCID) 40 MG tablet, TAKE 1 TABLET(40 MG) BY MOUTH TWICE DAILY, Disp: 180 tablet, Rfl: 3 .  glipiZIDE (GLUCOTROL XL) 2.5 MG 24 hr tablet, Take 2.5 mg by mouth daily with breakfast., Disp: , Rfl:  .  glucose blood (ONE TOUCH ULTRA TEST) test strip, Use as instructed, Disp: 100 each, Rfl: 12 .  hydrochlorothiazide (MICROZIDE) 12.5 MG capsule, Take 1 capsule (12.5 mg total) by mouth daily., Disp: 90 capsule, Rfl: 3 .  insulin degludec (TRESIBA FLEXTOUCH) 100 UNIT/ML SOPN FlexTouch Pen, Inject 0.1 mLs (10 Units total) into the skin daily. And pen needles 1/day, Disp: 5 pen, Rfl: 11 .  Lancets MISC, Use as directed once daily, Disp: 100 each, Rfl: 11 .  LORazepam (ATIVAN) 0.5 MG tablet, TAKE 1 TABLET BY MOUTH TWICE DAILY, Disp: 60 tablet, Rfl: 2 .  methylphenidate (CONCERTA) 54 MG PO CR tablet, Take 1 tablet (54 mg total) by mouth every morning., Disp: 30 tablet, Rfl: 0 .  pregabalin (LYRICA) 200 MG capsule, TAKE (1) CAPSULE THREE TIMES DAILY., Disp: 270 capsule, Rfl: 1 .  rosuvastatin (CRESTOR) 40 MG tablet, TAKE 1 TABLET(40 MG) BY MOUTH DAILY, Disp: 90 tablet, Rfl: 2 .  tapentadol HCl (NUCYNTA) 75 MG tablet, Take 1 tablet (75 mg total) by mouth 3 (three) times daily as needed., Disp: 90 tablet, Rfl: 0 .  TRADJENTA 5 MG TABS tablet, TAKE 1 TABLET(5 MG) BY MOUTH DAILY, Disp: 90 tablet, Rfl:  3  PAST MEDICAL HISTORY: Past Medical History:  Diagnosis Date  . Abdominal pain, left lower quadrant 06/06/2010  . Abdominal pain, unspecified site 01/19/2009  . ADD 10/09/2008  . ALLERGIC RHINITIS 10/09/2008  . ANXIETY DEPRESSION 02/01/2008  . COLONIC POLYPS 02/01/2008  . DIABETES MELLITUS, TYPE II 05/20/2010  . DYSPNEA 03/12/2010  . ERECTILE DYSFUNCTION 10/09/2008  . ERECTILE DYSFUNCTION, ORGANIC 05/20/2010  . FOOT PAIN, LEFT 05/20/2010  . GERD 02/01/2008  . Guillain Barr syndrome ([place])   . HEMORRHOIDS 02/01/2008  . HIATAL HERNIA 02/01/2008  . HYPERLIPIDEMIA 10/09/2008  . HYPERTENSION 10/09/2008  . MORTON'S NEUROMA 05/20/2010  . Other specified forms of hearing loss 06/27/2009  . PERIPHERAL EDEMA 05/20/2010  . PERIPHERAL NEUROPATHY 05/20/2010  . SLEEP APNEA, OBSTRUCTIVE 02/01/2008  . Stricture and stenosis of esophagus 02/02/2008  . Type II or unspecified type diabetes mellitus without mention of complication, uncontrolled 11/14/2010  . WRIST PAIN, LEFT 12/05/2009    PAST SURGICAL HISTORY: Past Surgical History:  Procedure Laterality Date  . CARPAL TUNNEL RELEASE    . ESOPHAGEAL DILATION  july 2009  . ESOPHAGOGASTRODUODENOSCOPY N/A 06/27/2014   Procedure: ESOPHAGOGASTRODUODENOSCOPY (EGD);  Surgeon: Lafayette Dragon, MD;  Location: [facility];  Service: Endoscopy;  Laterality: N/A;  . EYE SURGERY     catract surgery on both eyes  . ROTATOR CUFF REPAIR      FAMILY HISTORY: Family History  Problem Relation Age of Onset  . Diabetes Mother   . Heart disease Mother   . Hyperlipidemia Mother   . Depression Mother   . Diabetes Brother   . Colon cancer Neg Hx     SOCIAL HISTORY:  Social History   Socioeconomic History  . Marital status: Single    Spouse name: Not on file  . Number of children: Not on file  . Years of education: Not  on file  . Highest education level: Not on file  Social Needs  . Financial resource strain: Not on file  . Food insecurity - worry: Not on file  .  Food insecurity - inability: Not on file  . Transportation needs - medical: Not on file  . Transportation needs - non-medical: Not on file  Occupational History  . Occupation: Musician: [employer]  Tobacco Use  . Smoking status: Never Smoker  . Smokeless tobacco: Never Used  Substance and Sexual Activity  . Alcohol use: Yes    Comment: occasional  . Drug use: Yes    Types: Methaqualone  . Sexual activity: Yes    Comment: before I got sick  Other Topics Concern  . Not on file  Social History Narrative  . Not on file     PHYSICAL EXAM  Vitals:   10/19/17 1257  BP: 114/78  Pulse: 97  Resp: 18  Weight: 229 lb (103.9 kg)  Height: '5\' 9"'  (1.753 m)    Body mass index is 33.82 kg/m.   General: The patient is well-developed and well-nourished and in no acute distress  Skin: Extremities are without rash. He has mild pedal edema, slightly worse on the left.    HEENT:   His pharynx is [place] 4.  Neurologic Exam  Mental status: The patient is alert and oriented x 3 at the time of the examination. The patient has apparent normal recent and remote memory, with an apparently normal attention span and concentration ability.   Speech is normal.  Cranial nerves: Extraocular movements are full. Facial strength and sensation are normal..  Trapezius and sternocleidomastoid strength is normal. No dysarthria is noted.  The tongue is midline, and the patient has symmetric elevation of the soft palate. No obvious hearing deficits are noted.  Motor:  Muscle bulk is normal. Muscle tone is normal. Strength is 5/5 proximally in the arms and legs. Strength is 4/5 in the left APB and 4+/5 in the right APB and 4+/5 in the interosseous hand muscles. Strength is 4/5 in the foot and toe extensor muscles.   Sensory: Sensory testing is intact in hands upper legs.  There is 50-75% reduced vibration at the ankles relative to knees and complete loss at the toes.  Touch is normal at the  knees, reduced at ankles and near absent at toes.    Coordination: Cerebellar testing reveals good finger-nose-finger and reduced heel-to-shin bilaterally.  Gait and station: Station is normal.   Gait is normal. Tandem gait is wide. Romberg is positive.   Reflexes: Deep tendon reflexes are symmetric and 1+ bilaterally at deltoid and trace at knees, absent at ankles.        DIAGNOSTIC DATA (LABS, IMAGING, TESTING) - I reviewed patient records, labs, notes, testing and imaging myself where available.  Lab Results  Component Value Date   WBC 7.9 11/05/2016   HGB 15.7 11/05/2016   HCT 46.2 11/05/2016   MCV 89.6 11/05/2016   PLT 188.0 11/05/2016      Component Value Date/Time   NA 136 05/25/2017 1418   K 3.5 05/25/2017 1418   CL 93 (L) 05/25/2017 1418   CO2 33 (H) 05/25/2017 1418   GLUCOSE 280 (H) 05/25/2017 1418   BUN 15 05/25/2017 1418   CREATININE 1.33 05/25/2017 1418   CALCIUM 9.4 05/25/2017 1418   PROT 7.4 05/25/2017 1418   PROT 7.0 08/30/2015 1157   ALBUMIN 4.3 05/25/2017 1418   AST 27 05/25/2017 1418  ALT 31 05/25/2017 1418   ALKPHOS 100 05/25/2017 1418   BILITOT 1.1 05/25/2017 1418   GFRNONAA >90 08/06/2014 0240   GFRAA >90 08/06/2014 0240   Lab Results  Component Value Date   CHOL 98 05/25/2017   HDL 40.00 05/25/2017   LDLCALC 29 11/05/2016   LDLDIRECT 37.0 05/25/2017   TRIG 204.0 (H) 05/25/2017   CHOLHDL 2 05/25/2017   Lab Results  Component Value Date   HGBA1C 10.2 10/05/2017   Lab Results  Component Value Date   BZJIRCVE93 810 08/30/2015   Lab Results  Component Value Date   TSH 3.06 11/05/2016       ASSESSMENT AND PLAN  Diabetic polyneuropathy associated with type 2 diabetes mellitus (HCC)  Obstructive sleep apnea  Bilateral carpal tunnel syndrome  ANXIETY DEPRESSION  Excessive daytime sleepiness  Chronic pain syndrome  Dysesthesia    1.   Renew the Lyrica and Nucynta for his chronic neuropathic pain. 2.   Continue Cymbalta  to 120 mg daily for depression and neuropathic pain and continue Abilify. 3.   He has OSA but does not want to use CPAP or an oral appliance. We discussed trying to lose some more weight.    We discussed rechecking the PSG and reconsidering CPAP or referral to surgery. 4.   He will return to see me in 4 months or sooner if he has new or worsening neurologic symptoms.      Richard A. [name], MD, PhD 1/75/1025, 8:52 PM Certified in Neurology, Clinical Neurophysiology, Sleep Medicine, Pain Medicine and Neuroimaging  [facility] [address], [place], [place] [address] [phone]

## 2017-11-04 ENCOUNTER — Encounter: Payer: Self-pay | Admitting: Endocrinology

## 2017-11-04 ENCOUNTER — Ambulatory Visit: Payer: Medicare Other | Admitting: Endocrinology

## 2017-11-04 ENCOUNTER — Telehealth: Payer: Self-pay

## 2017-11-04 ENCOUNTER — Encounter: Payer: Medicare Other | Attending: Endocrinology | Admitting: Nutrition

## 2017-11-04 VITALS — BP 112/60 | HR 123 | Wt 227.4 lb

## 2017-11-04 DIAGNOSIS — E1165 Type 2 diabetes mellitus with hyperglycemia: Secondary | ICD-10-CM

## 2017-11-04 DIAGNOSIS — E1142 Type 2 diabetes mellitus with diabetic polyneuropathy: Secondary | ICD-10-CM

## 2017-11-04 DIAGNOSIS — IMO0002 Reserved for concepts with insufficient information to code with codable children: Secondary | ICD-10-CM

## 2017-11-04 NOTE — Patient Instructions (Addendum)
Please continue the same medications for now. Please call or message [place] in a few days, to tell us how the blood sugar is doing.   check your blood sugar once a day.  vary the time of day when you check, between before the 3 meals, and at bedtime.  also check if you have symptoms of your blood sugar being too high or too low.  please keep a record of the readings and bring it to your next appointment here (or you can bring the meter itself).  You can write it on any piece of paper.  please call us sooner if your blood sugar goes below 70, or if you have a lot of readings over 200.  Please come back for a follow-up appointment in 2-3 weeks.

## 2017-11-04 NOTE — Progress Notes (Signed)
Subjective:    Patient ID: Frank [name], male    DOB: [birth year], 62 y.o.   MRN: [id number]  HPI  Pt returns for f/u of diabetes mellitus: DM type: Insulin-requiring type 2 Dx'ed: 2878 Complications: polyneuropathy of the lower extremities, and associated renal insuff Therapy: insulin since 6767, trulicity, and 2 oral meds DKA: never Severe hypoglycemia: never Pancreatitis: never Pancreatic imaging: normal on 2006 CT Other: he has never been on insulin; he did not tolerate metformin-XR (GI sxs); he takes QD insulin, due to noncompliance. Interval history: He does not check cbg's, but he says he never misses the insulin.   Past Medical History:  Diagnosis Date  . Abdominal pain, left lower quadrant 06/06/2010  . Abdominal pain, unspecified site 01/19/2009  . ADD 10/09/2008  . ALLERGIC RHINITIS 10/09/2008  . ANXIETY DEPRESSION 02/01/2008  . COLONIC POLYPS 02/01/2008  . DIABETES MELLITUS, TYPE II 05/20/2010  . DYSPNEA 03/12/2010  . ERECTILE DYSFUNCTION 10/09/2008  . ERECTILE DYSFUNCTION, ORGANIC 05/20/2010  . FOOT PAIN, LEFT 05/20/2010  . GERD 02/01/2008  . Guillain Barr syndrome ([place])   . HEMORRHOIDS 02/01/2008  . HIATAL HERNIA 02/01/2008  . HYPERLIPIDEMIA 10/09/2008  . HYPERTENSION 10/09/2008  . MORTON'S NEUROMA 05/20/2010  . Other specified forms of hearing loss 06/27/2009  . PERIPHERAL EDEMA 05/20/2010  . PERIPHERAL NEUROPATHY 05/20/2010  . SLEEP APNEA, OBSTRUCTIVE 02/01/2008  . Stricture and stenosis of esophagus 02/02/2008  . Type II or unspecified type diabetes mellitus without mention of complication, uncontrolled 11/14/2010  . WRIST PAIN, LEFT 12/05/2009    Past Surgical History:  Procedure Laterality Date  . CARPAL TUNNEL RELEASE    . ESOPHAGEAL DILATION  july 2009  . ESOPHAGOGASTRODUODENOSCOPY N/A 06/27/2014   Procedure: ESOPHAGOGASTRODUODENOSCOPY (EGD);  Surgeon: Lafayette Dragon, MD;  Location: [facility];  Service: Endoscopy;  Laterality: N/A;  . EYE SURGERY     catract surgery on both eyes  . ROTATOR CUFF REPAIR      Social History   Socioeconomic History  . Marital status: Single    Spouse name: Not on file  . Number of children: Not on file  . Years of education: Not on file  . Highest education level: Not on file  Occupational History  . 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 Financial resource strain: Not on file  . Food insecurity:    Worry: Not on file    Inability: Not on file  . Transportation needs:    Medical: Not on file    Non-medical: Not on file  Tobacco Use  . Smoking status: Never Smoker  . Smokeless tobacco: Never Used  Substance and Sexual Activity  . Alcohol use: Yes    Comment: occasional  . Drug use: Yes    Types: Methaqualone  . Sexual activity: Yes    Comment: before I got sick  Lifestyle  . Physical activity:    Days per week: Not on file    Minutes per session: Not on file  . Stress: Not on file  Relationships  . Social connections:    Talks on phone: Not on file    Gets together: Not on file    Attends religious service: Not on file    Active member of club or organization: Not on file    Attends meetings of clubs or organizations: Not on file    Relationship status: Not on file  . Intimate partner violence:    Fear of current or ex  partner: Not on file    Emotionally abused: Not on file    Physically abused: Not on file    Forced sexual activity: Not on file  Other Topics Concern  . Not on file  Social History Narrative  . Not on file    Current Outpatient Medications on File Prior to Visit  Medication Sig Dispense Refill  . ARIPiprazole (ABILIFY) 10 MG tablet Take 1 tablet (10 mg total) by mouth daily. 90 tablet 3  . aspirin EC 81 MG tablet Take 1 tablet (81 mg total) by mouth daily.    . Blood Glucose Monitoring Suppl (ONE TOUCH ULTRA 2) w/Device KIT Use as directed 1 each 0  . cyclobenzaprine (FLEXERIL) 5 MG tablet TAKE 1 TABLET(5 MG) BY MOUTH AT BEDTIME 30  tablet 5  . Dulaglutide (TRULICITY) 1.5 AJ/2.8NO SOPN 0.5 ml weekly subcutaneous 6 mL 11  . DULoxetine (CYMBALTA) 60 MG capsule Take 1 capsule (60 mg total) by mouth 2 (two) times daily. 180 capsule 4  . famotidine (PEPCID) 40 MG tablet TAKE 1 TABLET(40 MG) BY MOUTH TWICE DAILY 180 tablet 3  . glipiZIDE (GLUCOTROL XL) 2.5 MG 24 hr tablet Take 2.5 mg by mouth daily with breakfast.    . glucose blood (ONE TOUCH ULTRA TEST) test strip Use as instructed 100 each 12  . hydrochlorothiazide (MICROZIDE) 12.5 MG capsule Take 1 capsule (12.5 mg total) by mouth daily. 90 capsule 3  . insulin degludec (TRESIBA FLEXTOUCH) 100 UNIT/ML SOPN FlexTouch Pen Inject 0.1 mLs (10 Units total) into the skin daily. And pen needles 1/day 5 pen 11  . Lancets MISC Use as directed once daily 100 each 11  . LORazepam (ATIVAN) 0.5 MG tablet TAKE 1 TABLET BY MOUTH TWICE DAILY 60 tablet 2  . methylphenidate (CONCERTA) 54 MG PO CR tablet Take 1 tablet (54 mg total) by mouth every morning. 30 tablet 0  . pregabalin (LYRICA) 200 MG capsule TAKE (1) CAPSULE THREE TIMES DAILY. 270 capsule 1  . rosuvastatin (CRESTOR) 40 MG tablet TAKE 1 TABLET(40 MG) BY MOUTH DAILY 90 tablet 2  . tapentadol HCl (NUCYNTA) 75 MG tablet Take 1 tablet (75 mg total) by mouth 3 (three) times daily as needed. 90 tablet 0  . TRADJENTA 5 MG TABS tablet TAKE 1 TABLET(5 MG) BY MOUTH DAILY 90 tablet 3   No current facility-administered medications on file prior to visit.     Allergies  Allergen Reactions  . Codeine Itching    REACTION: Itching  . Metformin And Related Other (See Comments)    GI upset, diarrhea  . Shellfish Allergy     Family History  Problem Relation Age of Onset  . Diabetes Mother   . Heart disease Mother   . Hyperlipidemia Mother   . Depression Mother   . Diabetes Brother   . Colon cancer Neg Hx     BP 112/60 (BP Location: Left Arm, Patient Position: Sitting, Cuff Size: Normal)   Pulse (!) 123   Wt 227 lb 6.4 oz (103.1 kg)    SpO2 93%   BMI 33.58 kg/m   Review of Systems He denies hypoglycemia     Objective:   Physical Exam VITAL SIGNS:  See vs page GENERAL: no distress Pulses: dorsalis pedis intact bilat.   MSK: no deformity of the feet CV: no leg edema Skin:  no ulcer on the feet.  normal color and temp on the feet. Neuro: sensation is intact to touch on the feet, but decreased from  normal Ext: There is bilateral onychomycosis of the toenails.          Assessment & Plan:  Insulin-requiring type 2 DM: today, he learns how to check cbg's, and he agrees to check.   Patient Instructions  Please continue the same medications for now. Please call or message [place] in a few days, to tell us how the blood sugar is doing.   check your blood sugar once a day.  vary the time of day when you check, between before the 3 meals, and at bedtime.  also check if you have symptoms of your blood sugar being too high or too low.  please keep a record of the readings and bring it to your next appointment here (or you can bring the meter itself).  You can write it on any piece of paper.  please call us sooner if your blood sugar goes below 70, or if you have a lot of readings over 200.  Please come back for a follow-up appointment in 2-3 weeks.

## 2017-11-04 NOTE — Telephone Encounter (Signed)
We have attempted to call the patient two times to schedule sleep study.  Patient has been unavailable at the phone numbers we have on file and has not returned our calls.  At this point we will send a letter asking patient to please contact the sleep lab to schedule their sleep study.  If patient calls back we will schedule them for their sleep study. 

## 2017-11-10 ENCOUNTER — Telehealth: Payer: Self-pay | Admitting: Endocrinology

## 2017-11-10 NOTE — Telephone Encounter (Signed)
I called & LVM for patient to call back to give me his blood sugar readings.

## 2017-11-10 NOTE — Telephone Encounter (Signed)
Patient is calling to give you his b/s reading

## 2017-11-10 NOTE — Telephone Encounter (Signed)
Patient's spouse says patient has given up sweet drinks, is walking 15 min. Every evening, and given up all sweets, and most bread.  Blood sugar readings:  (all Fasting): 11/10/17:  239 11/09/17: 238 11/08/17: 217 11/07/17: 293 2/29/19  264  Taking 10u of insulin qAM, and Trulicity

## 2017-11-10 NOTE — Telephone Encounter (Signed)
Please increase tresiba to 20 units qd.  Please call or message [place] next week, to tell us how the blood sugar is doing.  It helps to vary the time of day, so we know what it is doing at other times of day.

## 2017-11-12 ENCOUNTER — Telehealth: Payer: Self-pay

## 2017-11-12 NOTE — Telephone Encounter (Signed)
I called patient & have him new dosage of tresiba. He seemed to understand after my clarifying a few times. He stated that he would call back with blood sugars ina few days.

## 2017-11-12 NOTE — Telephone Encounter (Signed)
Noted/fim 

## 2017-11-12 NOTE — Telephone Encounter (Signed)
Pt was called on 3/12 and 3/18 to get sleep study scheduled.  A letter was sent and pt has returned call on 11/12/17.  Pt states he just had some major dental work done and didn't think he could come for the sleep study at this time. Pt was made aware of the process and told to call back as soon as he was ready to schedule.

## 2017-11-16 ENCOUNTER — Other Ambulatory Visit (INDEPENDENT_AMBULATORY_CARE_PROVIDER_SITE_OTHER): Payer: Medicare Other

## 2017-11-16 DIAGNOSIS — E1142 Type 2 diabetes mellitus with diabetic polyneuropathy: Secondary | ICD-10-CM

## 2017-11-16 DIAGNOSIS — E1165 Type 2 diabetes mellitus with hyperglycemia: Secondary | ICD-10-CM | POA: Diagnosis not present

## 2017-11-16 DIAGNOSIS — Z0000 Encounter for general adult medical examination without abnormal findings: Secondary | ICD-10-CM

## 2017-11-16 DIAGNOSIS — IMO0002 Reserved for concepts with insufficient information to code with codable children: Secondary | ICD-10-CM

## 2017-11-16 LAB — HEPATIC FUNCTION PANEL
ALBUMIN: 4.3 g/dL (ref 3.5–5.2)
ALT: 25 U/L (ref 0–53)
AST: 24 U/L (ref 0–37)
Alkaline Phosphatase: 75 U/L (ref 39–117)
Bilirubin, Direct: 0.2 mg/dL (ref 0.0–0.3)
Total Bilirubin: 1 mg/dL (ref 0.2–1.2)
Total Protein: 7.3 g/dL (ref 6.0–8.3)

## 2017-11-16 LAB — CBC WITH DIFFERENTIAL/PLATELET
BASOS PCT: 0.6 % (ref 0.0–3.0)
Basophils Absolute: 0.1 10*3/uL (ref 0.0–0.1)
EOS PCT: 1.2 % (ref 0.0–5.0)
Eosinophils Absolute: 0.1 10*3/uL (ref 0.0–0.7)
HCT: 46.9 % (ref 39.0–52.0)
Hemoglobin: 16.4 g/dL (ref 13.0–17.0)
LYMPHS ABS: 4.9 10*3/uL — AB (ref 0.7–4.0)
Lymphocytes Relative: 51.4 % — ABNORMAL HIGH (ref 12.0–46.0)
MCHC: 35 g/dL (ref 30.0–36.0)
MCV: 89 fl (ref 78.0–100.0)
MONOS PCT: 8.6 % (ref 3.0–12.0)
Monocytes Absolute: 0.8 10*3/uL (ref 0.1–1.0)
Neutro Abs: 3.6 10*3/uL (ref 1.4–7.7)
Neutrophils Relative %: 38.2 % — ABNORMAL LOW (ref 43.0–77.0)
Platelets: 197 10*3/uL (ref 150.0–400.0)
RBC: 5.27 Mil/uL (ref 4.22–5.81)
RDW: 13.3 % (ref 11.5–15.5)
WBC: 9.5 10*3/uL (ref 4.0–10.5)

## 2017-11-16 LAB — BASIC METABOLIC PANEL
BUN: 17 mg/dL (ref 6–23)
CALCIUM: 9.7 mg/dL (ref 8.4–10.5)
CO2: 31 mEq/L (ref 19–32)
Chloride: 97 mEq/L (ref 96–112)
Creatinine, Ser: 1.35 mg/dL (ref 0.40–1.50)
GFR: 56.51 mL/min — AB (ref >60.00)
Glucose, Bld: 144 mg/dL — ABNORMAL HIGH (ref 70–99)
POTASSIUM: 4.2 meq/L (ref 3.5–5.1)
SODIUM: 139 meq/L (ref 135–145)

## 2017-11-16 LAB — LIPID PANEL
Cholesterol: 99 mg/dL (ref 0–200)
HDL: 37.2 mg/dL — AB (ref >39.00)
LDL Cholesterol: 23 mg/dL (ref 0–99)
NonHDL: 61.55
TRIGLYCERIDES: 192 mg/dL — AB (ref 0.0–149.0)
Total CHOL/HDL Ratio: 3
VLDL: 38.4 mg/dL (ref 0.0–40.0)

## 2017-11-16 LAB — URINALYSIS, ROUTINE W REFLEX MICROSCOPIC
Bilirubin Urine: NEGATIVE
Hgb urine dipstick: NEGATIVE
KETONES UR: NEGATIVE
Leukocytes, UA: NEGATIVE
NITRITE: NEGATIVE
PH: 6 (ref 5.0–8.0)
RBC / HPF: NONE SEEN (ref 0–?)
SPECIFIC GRAVITY, URINE: 1.01 (ref 1.000–1.030)
TOTAL PROTEIN, URINE-UPE24: NEGATIVE
UROBILINOGEN UA: 0.2 (ref 0.0–1.0)

## 2017-11-16 LAB — MICROALBUMIN / CREATININE URINE RATIO
CREATININE, U: 68.6 mg/dL
MICROALB/CREAT RATIO: 1 mg/g (ref 0.0–30.0)
Microalb, Ur: <0.7 mg/dL (ref 0.0–1.9)

## 2017-11-16 LAB — PSA: PSA: 0.36 ng/mL (ref 0.10–4.00)

## 2017-11-16 LAB — TSH: TSH: 3.65 u[IU]/mL (ref 0.35–4.50)

## 2017-11-16 LAB — HEMOGLOBIN A1C: Hgb A1c MFr Bld: 9.7 % — ABNORMAL HIGH (ref 4.6–6.5)

## 2017-11-23 ENCOUNTER — Ambulatory Visit: Payer: Medicare Other | Admitting: Internal Medicine

## 2017-11-23 ENCOUNTER — Encounter: Payer: Self-pay | Admitting: Internal Medicine

## 2017-11-23 VITALS — BP 122/84 | HR 93 | Temp 99.0°F | Ht 69.0 in | Wt 225.0 lb

## 2017-11-23 DIAGNOSIS — IMO0002 Reserved for concepts with insufficient information to code with codable children: Secondary | ICD-10-CM

## 2017-11-23 DIAGNOSIS — E1142 Type 2 diabetes mellitus with diabetic polyneuropathy: Secondary | ICD-10-CM

## 2017-11-23 DIAGNOSIS — Z0000 Encounter for general adult medical examination without abnormal findings: Secondary | ICD-10-CM

## 2017-11-23 DIAGNOSIS — E1165 Type 2 diabetes mellitus with hyperglycemia: Secondary | ICD-10-CM | POA: Diagnosis not present

## 2017-11-23 DIAGNOSIS — E785 Hyperlipidemia, unspecified: Secondary | ICD-10-CM | POA: Diagnosis not present

## 2017-11-23 MED ORDER — ROSUVASTATIN CALCIUM 20 MG PO TABS: 20.0000 mg | ORAL_TABLET | Freq: Every day | ORAL | 3 refills | Status: DC

## 2017-11-23 MED ORDER — METHYLPHENIDATE HCL ER (OSM) 54 MG PO TBCR: 54.0000 mg | EXTENDED_RELEASE_TABLET | ORAL | 0 refills | Status: DC

## 2017-11-23 NOTE — Patient Instructions (Addendum)
Please call Dr Loanne Drilling office to report your Blood Sugars more than 200 very often.    OK to decrease the crestor 20 mg per day  Please continue all other medications as before, and refills have been done if requested - the generic concerta  Please have the pharmacy call with any other refills you may need.  Please continue your efforts at being more active, low cholesterol diet, and weight control.  You are otherwise up to date with prevention measures today.  Please keep your appointments with your specialists as you may have planned  Please return in 6 months, or sooner if needed, with Lab testing done 3-5 days before

## 2017-11-23 NOTE — Assessment & Plan Note (Signed)

## 2017-11-23 NOTE — Assessment & Plan Note (Signed)
Urged pt to contact endo for further recommendations

## 2017-11-23 NOTE — Assessment & Plan Note (Signed)
Overcontrolled, to reduce crestor to 20 mg

## 2017-11-23 NOTE — Progress Notes (Signed)
Subjective:    Patient ID: Frank [name], male    DOB: [birth year], 62 y.o.   MRN: [id number]  HPI  Here for wellness and f/u;  Overall doing ok;  Pt denies Chest pain, worsening SOB, DOE, wheezing, orthopnea, PND, worsening LE edema, palpitations, dizziness or syncope.  Pt denies neurological change such as new headache, facial or extremity weakness.  Pt denies polydipsia, polyuria, or low sugar symptoms. Pt states overall good compliance with treatment and medications, good tolerability, and has been trying to follow appropriate diet.  Pt denies worsening depressive symptoms, suicidal ideation or panic. No fever, night sweats, wt loss, loss of appetite, or other constitutional symptoms.  Pt states good ability with ADL's, has low fall risk, home safety reviewed and adequate, no other significant changes in hearing or vision, and only occasionally active with exercise.  CBG's in 200's recently per pt, but has not notified endo as requested at last visit  No other new complaints or interval hx Past Medical History:  Diagnosis Date  . Abdominal pain, left lower quadrant 06/06/2010  . Abdominal pain, unspecified site 01/19/2009  . ADD 10/09/2008  . ALLERGIC RHINITIS 10/09/2008  . ANXIETY DEPRESSION 02/01/2008  . COLONIC POLYPS 02/01/2008  . DIABETES MELLITUS, TYPE II 05/20/2010  . DYSPNEA 03/12/2010  . ERECTILE DYSFUNCTION 10/09/2008  . ERECTILE DYSFUNCTION, ORGANIC 05/20/2010  . FOOT PAIN, LEFT 05/20/2010  . GERD 02/01/2008  . Guillain Barr syndrome ([place])   . HEMORRHOIDS 02/01/2008  . HIATAL HERNIA 02/01/2008  . HYPERLIPIDEMIA 10/09/2008  . HYPERTENSION 10/09/2008  . MORTON'S NEUROMA 05/20/2010  . Other specified forms of hearing loss 06/27/2009  . PERIPHERAL EDEMA 05/20/2010  . PERIPHERAL NEUROPATHY 05/20/2010  . SLEEP APNEA, OBSTRUCTIVE 02/01/2008  . Stricture and stenosis of esophagus 02/02/2008  . Type II or unspecified type diabetes mellitus without mention of complication, uncontrolled  11/14/2010  . WRIST PAIN, LEFT 12/05/2009   Past Surgical History:  Procedure Laterality Date  . CARPAL TUNNEL RELEASE    . ESOPHAGEAL DILATION  july 2009  . ESOPHAGOGASTRODUODENOSCOPY N/A 06/27/2014   Procedure: ESOPHAGOGASTRODUODENOSCOPY (EGD);  Surgeon: Lafayette Dragon, MD;  Location: [facility];  Service: Endoscopy;  Laterality: N/A;  . EYE SURGERY     catract surgery on both eyes  . ROTATOR CUFF REPAIR      reports that he has never smoked. He has never used smokeless tobacco. He reports that he drinks alcohol. He reports that he has current or past drug history. Drug: Methaqualone. family history includes Depression in his mother; Diabetes in his brother and mother; Heart disease in his mother; Hyperlipidemia in his mother. Allergies  Allergen Reactions  . Codeine Itching    REACTION: Itching  . Metformin And Related Other (See Comments)    GI upset, diarrhea  . Shellfish Allergy    Current Outpatient Medications on File Prior to Visit  Medication Sig Dispense Refill  . ARIPiprazole (ABILIFY) 10 MG tablet Take 1 tablet (10 mg total) by mouth daily. 90 tablet 3  . aspirin EC 81 MG tablet Take 1 tablet (81 mg total) by mouth daily.    . Blood Glucose Monitoring Suppl (ONE TOUCH ULTRA 2) w/Device KIT Use as directed 1 each 0  . cyclobenzaprine (FLEXERIL) 5 MG tablet TAKE 1 TABLET(5 MG) BY MOUTH AT BEDTIME 30 tablet 5  . Dulaglutide (TRULICITY) 1.5 JO/8.4ZY SOPN 0.5 ml weekly subcutaneous 6 mL 11  . DULoxetine (CYMBALTA) 60 MG capsule Take 1 capsule (60 mg total) by mouth  2 (two) times daily. 180 capsule 4  . famotidine (PEPCID) 40 MG tablet TAKE 1 TABLET(40 MG) BY MOUTH TWICE DAILY 180 tablet 3  . glipiZIDE (GLUCOTROL XL) 2.5 MG 24 hr tablet Take 2.5 mg by mouth daily with breakfast.    . glucose blood (ONE TOUCH ULTRA TEST) test strip Use as instructed 100 each 12  . hydrochlorothiazide (MICROZIDE) 12.5 MG capsule Take 1 capsule (12.5 mg total) by mouth daily. 90 capsule 3  .  insulin degludec (TRESIBA FLEXTOUCH) 100 UNIT/ML SOPN FlexTouch Pen Inject 0.1 mLs (10 Units total) into the skin daily. And pen needles 1/day 5 pen 11  . Lancets MISC Use as directed once daily 100 each 11  . LORazepam (ATIVAN) 0.5 MG tablet TAKE 1 TABLET BY MOUTH TWICE DAILY 60 tablet 2  . pregabalin (LYRICA) 200 MG capsule TAKE (1) CAPSULE THREE TIMES DAILY. 270 capsule 1  . tapentadol HCl (NUCYNTA) 75 MG tablet Take 1 tablet (75 mg total) by mouth 3 (three) times daily as needed. 90 tablet 0  . TRADJENTA 5 MG TABS tablet TAKE 1 TABLET(5 MG) BY MOUTH DAILY 90 tablet 3   No current facility-administered medications on file prior to visit.    Review of Systems Constitutional: Negative for other unusual diaphoresis, sweats, appetite or weight changes HENT: Negative for other worsening hearing loss, ear pain, facial swelling, mouth sores or neck stiffness.   Eyes: Negative for other worsening pain, redness or other visual disturbance.  Respiratory: Negative for other stridor or swelling Cardiovascular: Negative for other palpitations or other chest pain  Gastrointestinal: Negative for worsening diarrhea or loose stools, blood in stool, distention or other pain Genitourinary: Negative for hematuria, flank pain or other change in urine volume.  Musculoskeletal: Negative for myalgias or other joint swelling.  Skin: Negative for other color change, or other wound or worsening drainage.  Neurological: Negative for other syncope or numbness. Hematological: Negative for other adenopathy or swelling Psychiatric/Behavioral: Negative for hallucinations, other worsening agitation, SI, self-injury, or new decreased concentration All other system neg per pt    Objective:   Physical Exam BP 122/84   Pulse 93   Temp 99 F (37.2 C) (Oral)   Ht _0  (1.753 m)   Wt 225 lb (102.1 kg)   SpO2 99%   BMI 33.23 kg/m  VS noted,  Constitutional: Pt is oriented to person, place, and time. Appears  well-developed and well-nourished, in no significant distress and comfortable Head: Normocephalic and atraumatic  Eyes: Conjunctivae and EOM are normal. Pupils are equal, round, and reactive to light Right Ear: External ear normal without discharge Left Ear: External ear normal without discharge Nose: Nose without discharge or deformity Mouth/Throat: Oropharynx is without other ulcerations and moist  Neck: Normal range of motion. Neck supple. No JVD present. No tracheal deviation present or significant neck LA or mass Cardiovascular: Normal rate, regular rhythm, normal heart sounds and intact distal pulses.   Pulmonary/Chest: WOB normal and breath sounds without rales or wheezing  Abdominal: Soft. Bowel sounds are normal. NT. No HSM  Musculoskeletal: Normal range of motion. Exhibits no edema Lymphadenopathy: Has no other cervical adenopathy.  Neurological: Pt is alert and oriented to person, place, and time. Pt has normal reflexes. No cranial nerve deficit. Motor grossly intact, Gait intact Skin: Skin is warm and dry. No rash noted or new ulcerations Psychiatric:  Has normal mood and affect. Behavior is normal without agitation No other exam findings Lab Results  Component Value Date  WBC 9.5 11/16/2017   HGB 16.4 11/16/2017   HCT 46.9 11/16/2017   PLT 197.0 11/16/2017   GLUCOSE 144 (H) 11/16/2017   CHOL 99 11/16/2017   TRIG 192.0 (H) 11/16/2017   HDL 37.20 (L) 11/16/2017   LDLDIRECT 37.0 05/25/2017   LDLCALC 23 11/16/2017   ALT 25 11/16/2017   AST 24 11/16/2017   NA 139 11/16/2017   K 4.2 11/16/2017   CL 97 11/16/2017   CREATININE 1.35 11/16/2017   BUN 17 11/16/2017   CO2 31 11/16/2017   TSH 3.65 11/16/2017   PSA 0.36 11/16/2017   HGBA1C 9.7 (H) 11/16/2017   MICROALBUR <0.7 11/16/2017      Assessment & Plan:

## 2017-11-24 ENCOUNTER — Ambulatory Visit: Payer: Medicare Other | Admitting: Internal Medicine

## 2017-11-24 ENCOUNTER — Telehealth: Payer: Self-pay | Admitting: Endocrinology

## 2017-11-24 NOTE — Telephone Encounter (Signed)
Please increase insulin to 25 units qd.   Please call or message [place] next week, to tell us how the blood sugar is doing

## 2017-11-24 NOTE — Telephone Encounter (Signed)
Patients husband faxed over patients blood sugar readings and just wanted to follow up that they have been received,

## 2017-11-25 ENCOUNTER — Other Ambulatory Visit: Payer: Self-pay

## 2017-11-25 ENCOUNTER — Telehealth: Payer: Self-pay | Admitting: Endocrinology

## 2017-11-25 MED ORDER — INSULIN DEGLUDEC 100 UNIT/ML ~~LOC~~ SOPN: 25.0000 [IU] | PEN_INJECTOR | Freq: Every day | SUBCUTANEOUS | 11 refills | Status: DC

## 2017-11-25 NOTE — Telephone Encounter (Signed)
Patient out of insulin [place]). Dr. Loanne Drilling upped the dosage to 25 but did not send script for insulin with new dosage to pharmacy. Needs asap. Pharmacy is [facility] on [address] and [address]

## 2017-11-25 NOTE — Telephone Encounter (Signed)
I called & spoke with patient's husband giving him new dosage. He stated that they would call next week with new blood sugar readings.

## 2017-11-25 NOTE — Telephone Encounter (Signed)
I called & informed patient's husband that prescription was sent.

## 2017-12-14 ENCOUNTER — Other Ambulatory Visit: Payer: Self-pay | Admitting: Internal Medicine

## 2017-12-21 ENCOUNTER — Other Ambulatory Visit: Payer: Self-pay | Admitting: Internal Medicine

## 2017-12-21 NOTE — Telephone Encounter (Signed)
11/21/2017 60# 

## 2017-12-21 NOTE — Telephone Encounter (Signed)
Done erx 

## 2017-12-31 ENCOUNTER — Telehealth: Payer: Self-pay | Admitting: Endocrinology

## 2017-12-31 NOTE — Telephone Encounter (Signed)
please call patient: I received your cbg record.  Please advise ov next available.

## 2018-01-01 NOTE — Telephone Encounter (Signed)
I have scheduled patient 6/3. His husband stated that his numbers have been averaging in the 150-160's.

## 2018-01-11 ENCOUNTER — Encounter: Payer: Self-pay | Admitting: Endocrinology

## 2018-01-11 ENCOUNTER — Ambulatory Visit: Payer: Medicare Other | Admitting: Endocrinology

## 2018-01-11 VITALS — BP 132/84 | HR 83 | Wt 223.4 lb

## 2018-01-11 DIAGNOSIS — E1165 Type 2 diabetes mellitus with hyperglycemia: Secondary | ICD-10-CM | POA: Diagnosis not present

## 2018-01-11 DIAGNOSIS — IMO0002 Reserved for concepts with insufficient information to code with codable children: Secondary | ICD-10-CM

## 2018-01-11 DIAGNOSIS — E1142 Type 2 diabetes mellitus with diabetic polyneuropathy: Secondary | ICD-10-CM

## 2018-01-11 LAB — POCT GLYCOSYLATED HEMOGLOBIN (HGB A1C): HEMOGLOBIN A1C: 8.4 % — AB (ref 4.0–5.6)

## 2018-01-11 MED ORDER — INSULIN DEGLUDEC 100 UNIT/ML ~~LOC~~ SOPN: 30.0000 [IU] | PEN_INJECTOR | Freq: Every day | SUBCUTANEOUS | 11 refills | Status: DC

## 2018-01-11 NOTE — Patient Instructions (Addendum)
Please increase the tresiba to 30 units daily, and: continue the same other diabetes medications for now. Please call or message [place] in a few days, to tell us how the blood sugar is doing.   check your blood sugar once a day.  vary the time of day when you check, between before the 3 meals, and at bedtime.  also check if you have symptoms of your blood sugar being too high or too low.  please keep a record of the readings and bring it to your next appointment here (or you can bring the meter itself).  You can write it on any piece of paper.  please call us sooner if your blood sugar goes below 70, or if you have a lot of readings over 200.  Please come back for a follow-up appointment in 2 months.

## 2018-01-11 NOTE — Progress Notes (Signed)
Subjective:    Patient ID: Frank [name], male    DOB: [birth year], 62 y.o.   MRN: [id number]  HPI Pt returns for f/u of diabetes mellitus: DM type: Insulin-requiring type 2 Dx'ed: 6314 Complications: polyneuropathy and renal insuff Therapy: insulin since 9702, trulicity, and 2 oral meds DKA: never Severe hypoglycemia: never Pancreatitis: never Pancreatic imaging: normal on 2006 CT Other: he has never been on insulin; he did not tolerate metformin-XR (GI sxs); he takes QD insulin, due to noncompliance. Interval history: He brings a record of his cbg's which I have reviewed today.  It varies from 111-192.  There is no trend throughout the day.  he says he never misses the insulin.  Past Medical History:  Diagnosis Date  . Abdominal pain, left lower quadrant 06/06/2010  . Abdominal pain, unspecified site 01/19/2009  . ADD 10/09/2008  . ALLERGIC RHINITIS 10/09/2008  . ANXIETY DEPRESSION 02/01/2008  . COLONIC POLYPS 02/01/2008  . DIABETES MELLITUS, TYPE II 05/20/2010  . DYSPNEA 03/12/2010  . ERECTILE DYSFUNCTION 10/09/2008  . ERECTILE DYSFUNCTION, ORGANIC 05/20/2010  . FOOT PAIN, LEFT 05/20/2010  . GERD 02/01/2008  . Guillain Barr syndrome ([place])   . HEMORRHOIDS 02/01/2008  . HIATAL HERNIA 02/01/2008  . HYPERLIPIDEMIA 10/09/2008  . HYPERTENSION 10/09/2008  . MORTON'S NEUROMA 05/20/2010  . Other specified forms of hearing loss 06/27/2009  . PERIPHERAL EDEMA 05/20/2010  . PERIPHERAL NEUROPATHY 05/20/2010  . SLEEP APNEA, OBSTRUCTIVE 02/01/2008  . Stricture and stenosis of esophagus 02/02/2008  . Type II or unspecified type diabetes mellitus without mention of complication, uncontrolled 11/14/2010  . WRIST PAIN, LEFT 12/05/2009    Past Surgical History:  Procedure Laterality Date  . CARPAL TUNNEL RELEASE    . ESOPHAGEAL DILATION  july 2009  . ESOPHAGOGASTRODUODENOSCOPY N/A 06/27/2014   Procedure: ESOPHAGOGASTRODUODENOSCOPY (EGD);  Surgeon: Lafayette Dragon, MD;  Location: [facility];   Service: Endoscopy;  Laterality: N/A;  . EYE SURGERY     catract surgery on both eyes  . ROTATOR CUFF REPAIR      Social History   Socioeconomic History  . Marital status: Single    Spouse name: Not on file  . Number of children: Not on file  . Years of education: Not on file  . Highest education level: Not on file  Occupational History  . 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 Financial resource strain: Not on file  . Food insecurity:    Worry: Not on file    Inability: Not on file  . Transportation needs:    Medical: Not on file    Non-medical: Not on file  Tobacco Use  . Smoking status: Never Smoker  . Smokeless tobacco: Never Used  Substance and Sexual Activity  . Alcohol use: Yes    Comment: occasional  . Drug use: Yes    Types: Methaqualone  . Sexual activity: Yes    Comment: before I got sick  Lifestyle  . Physical activity:    Days per week: Not on file    Minutes per session: Not on file  . Stress: Not on file  Relationships  . Social connections:    Talks on phone: Not on file    Gets together: Not on file    Attends religious service: Not on file    Active member of club or organization: Not on file    Attends meetings of clubs or organizations: Not on file    Relationship status: Not on  file  . Intimate partner violence:    Fear of current or ex partner: Not on file    Emotionally abused: Not on file    Physically abused: Not on file    Forced sexual activity: Not on file  Other Topics Concern  . Not on file  Social History Narrative  . Not on file    Current Outpatient Medications on File Prior to Visit  Medication Sig Dispense Refill  . ARIPiprazole (ABILIFY) 10 MG tablet Take 1 tablet (10 mg total) by mouth daily. 90 tablet 3  . aspirin EC 81 MG tablet Take 1 tablet (81 mg total) by mouth daily.    . Blood Glucose Monitoring Suppl (ONE TOUCH ULTRA 2) w/Device KIT Use as directed 1 each 0  . cyclobenzaprine  (FLEXERIL) 5 MG tablet TAKE 1 TABLET(5 MG) BY MOUTH AT BEDTIME 30 tablet 5  . Dulaglutide (TRULICITY) 1.5 LI/1.0VU SOPN 0.5 ml weekly subcutaneous 6 mL 11  . DULoxetine (CYMBALTA) 60 MG capsule Take 1 capsule (60 mg total) by mouth 2 (two) times daily. 180 capsule 4  . famotidine (PEPCID) 40 MG tablet TAKE 1 TABLET(40 MG) BY MOUTH TWICE DAILY 180 tablet 3  . glipiZIDE (GLUCOTROL XL) 2.5 MG 24 hr tablet Take 2.5 mg by mouth daily with breakfast.    . glucose blood (ONE TOUCH ULTRA TEST) test strip USE AS DIRECTED 100 each 1  . hydrochlorothiazide (MICROZIDE) 12.5 MG capsule Take 1 capsule (12.5 mg total) by mouth daily. 90 capsule 3  . LORazepam (ATIVAN) 0.5 MG tablet TAKE 1 TABLET BY MOUTH TWICE DAILY 60 tablet 2  . methylphenidate (CONCERTA) 54 MG PO CR tablet Take 1 tablet (54 mg total) by mouth every morning. 30 tablet 0  . ONETOUCH DELICA LANCETS FINE MISC USE AS DIRECTED EVERY DAY 100 each 1  . pregabalin (LYRICA) 200 MG capsule TAKE (1) CAPSULE THREE TIMES DAILY. 270 capsule 1  . rosuvastatin (CRESTOR) 20 MG tablet Take 1 tablet (20 mg total) by mouth daily. 90 tablet 3  . tapentadol HCl (NUCYNTA) 75 MG tablet Take 1 tablet (75 mg total) by mouth 3 (three) times daily as needed. 90 tablet 0  . TRADJENTA 5 MG TABS tablet TAKE 1 TABLET(5 MG) BY MOUTH DAILY 90 tablet 3   No current facility-administered medications on file prior to visit.     Allergies  Allergen Reactions  . Codeine Itching    REACTION: Itching  . Metformin And Related Other (See Comments)    GI upset, diarrhea  . Shellfish Allergy     Family History  Problem Relation Age of Onset  . Diabetes Mother   . Heart disease Mother   . Hyperlipidemia Mother   . Depression Mother   . Diabetes Brother   . Colon cancer Neg Hx     BP 132/84   Pulse 83   Wt 223 lb 6.4 oz (101.3 kg)   SpO2 96%   BMI 32.99 kg/m    Review of Systems He denies hypoglycemia    Objective:   Physical Exam VITAL SIGNS:  See vs  page GENERAL: no distress Pulses: dorsalis pedis intact bilat.   MSK: no deformity of the feet CV: no leg edema Skin:  no ulcer on the feet.  normal color and temp on the feet. Neuro: sensation is intact to touch on the feet, but severely decreased from normal Ext: There is bilateral onychomycosis of the toenails.    Lab Results  Component Value Date  HGBA1C 8.4 (A) 01/11/2018      Assessment & Plan:  Insulin-requiring type 2 DM, with renal insuff: he needs increased rx   Patient Instructions  Please increase the tresiba to 30 units daily, and: continue the same other diabetes medications for now. Please call or message [place] in a few days, to tell us how the blood sugar is doing.   check your blood sugar once a day.  vary the time of day when you check, between before the 3 meals, and at bedtime.  also check if you have symptoms of your blood sugar being too high or too low.  please keep a record of the readings and bring it to your next appointment here (or you can bring the meter itself).  You can write it on any piece of paper.  please call us sooner if your blood sugar goes below 70, or if you have a lot of readings over 200.  Please come back for a follow-up appointment in 2 months.

## 2018-01-11 NOTE — Progress Notes (Signed)
Frank [name]

## 2018-01-12 ENCOUNTER — Telehealth: Payer: Self-pay | Admitting: Neurology

## 2018-01-12 MED ORDER — TAPENTADOL HCL 75 MG PO TABS: 75.0000 mg | ORAL_TABLET | Freq: Three times a day (TID) | ORAL | 0 refills | Status: DC | PRN

## 2018-01-12 NOTE — Telephone Encounter (Signed)
Rx. awaiting RAS sig/fim 

## 2018-01-12 NOTE — Telephone Encounter (Signed)
Pt request refill for tapentadol HCl (NUCYNTA) 75 MG tablet.

## 2018-01-12 NOTE — Telephone Encounter (Signed)
Rx's up front [facility] 

## 2018-01-22 ENCOUNTER — Other Ambulatory Visit: Payer: Self-pay | Admitting: Internal Medicine

## 2018-02-01 ENCOUNTER — Other Ambulatory Visit: Payer: Self-pay | Admitting: Internal Medicine

## 2018-02-15 ENCOUNTER — Encounter: Payer: Self-pay | Admitting: Dietician

## 2018-02-15 ENCOUNTER — Encounter: Payer: Medicare Other | Attending: Internal Medicine | Admitting: Dietician

## 2018-02-15 DIAGNOSIS — E1165 Type 2 diabetes mellitus with hyperglycemia: Secondary | ICD-10-CM | POA: Insufficient documentation

## 2018-02-15 DIAGNOSIS — E1142 Type 2 diabetes mellitus with diabetic polyneuropathy: Secondary | ICD-10-CM

## 2018-02-15 DIAGNOSIS — Z713 Dietary counseling and surveillance: Secondary | ICD-10-CM | POA: Diagnosis not present

## 2018-02-15 DIAGNOSIS — IMO0002 Reserved for concepts with insufficient information to code with codable children: Secondary | ICD-10-CM

## 2018-02-15 NOTE — Patient Instructions (Addendum)
Increase your Tyler Aas to 30 units per day per Dr. Cordelia Pen note 01/11/18. Start walking again 15 minutes daily.  Increase this slowly to 30 minutes if tolerated. Make an eye appointment. When you have ice cream or any other snack, put a small portion in a bowl and enjoy away from the TV. Add protein to breakfast Eat lunch daily Drink juice drinks and lemonade with no carbohydrate Consider rechecking your vitamin B-12

## 2018-02-15 NOTE — Progress Notes (Signed)
Diabetes Self-Management Education  Visit Type: First/Initial  Appt. Start Time: 1020 Appt. End Time: 1120  02/18/2018  [name], identified by name and date of birth, is a 62 y.o. male with a diagnosis of Diabetes: Type 2. Other history includes polyneuropathy, renal insufficiency, Guillain Barre syndrome October 2015 resulting in paralyzation for 6 months and decreased weight to 150 lbs.  Medications include:  Tresiba 25 units each am, Trulicity, glipizide, tradjenta  Labs include (11/16/17):  GFR 56,  A1C 9.7% which was decreased from 10.2% 10/05/17.  Last A1C was 8.4%  Weight hx: 150 lbs 2016 following Guillain Barre syndrome 227 lbs 11/04/17 223 lbs today  Patient lives with Frank [name].  Frank [name] prepares the meals and helps him in other ways including checking his feet and making sure he is taking care of himself.  Frank [name] also does the shopping and Frank [name] often asks for increased ice cream.  Frank [name] prepares balanced meals for dinner and days he is off but otherwise patient eats poorly during the days that Frank [name] works and Patient states that he is just "too lazy to prepare food". 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  ASSESSMENT  Height 5\' 9"  (1.753 m), weight 223 lb (101.2 kg). Body mass index is 32.93 kg/m.  Diabetes Self-Management Education - 02/18/18 1638      Visit Information   Visit Type  First/Initial      Initial Visit   Diabetes Type  Type 2    Are you currently following a meal plan?  No    Are you taking your medications as prescribed?  Yes    Date Diagnosed  2012      Health Coping   How would you rate your overall health?  Good      Psychosocial Assessment   Patient Belief/Attitude about Diabetes  Defeat/Burnout defeated    Self-care barriers  Other (comment) decreased memory/self care    Self-management support  Doctor's office;Family    Other persons present  Patient;Friend    Patient Concerns  Nutrition/Meal planning;Glycemic Control;Weight  Control;Healthy Lifestyle    Special Needs  Instruct caregiver;Simplified materials    Preferred Learning Style  No preference indicated    Learning Readiness  Ready    How often do you need to have someone help you when you read instructions, pamphlets, or other written materials from your doctor or pharmacy?  5 - Always    What is the last grade level you completed in school?  12th grade      Pre-Education Assessment   Patient understands the diabetes disease and treatment process.  Needs Review    Patient understands incorporating nutritional management into lifestyle.  Needs Review    Patient undertands incorporating physical activity into lifestyle.  Needs Review    Patient understands using medications safely.  Needs Review    Patient understands monitoring blood glucose, interpreting and using results  Needs Review    Patient understands prevention, detection, and treatment of acute complications.  Needs Review    Patient understands prevention, detection, and treatment of chronic complications.  Needs Review    Patient understands how to develop strategies to address psychosocial issues.  Needs Review    Patient understands how to develop strategies to promote health/change behavior.  Needs Review      Complications   Last HgB A1C per patient/outside source  8.4 % 01/11/18 decreased from 10.2% 10/05/17 and 9.7 11/26/17    How often do you check your blood sugar?  1-2  times/day    Fasting Blood glucose range (mg/dL)  130-179;70-129    Postprandial Blood glucose range (mg/dL)  130-179    Number of hypoglycemic episodes per month  0    Number of hyperglycemic episodes per week  7    Can you tell when your blood sugar is high?  No    Have you had a dilated eye exam in the past 12 months?  No    Have you had a dental exam in the past 12 months?  Yes    Are you checking your feet?  Yes    How many days per week are you checking your feet?  7      Dietary Intake   Breakfast  1 slice [employer]  toast with jelly, fresh fruit, scrambled egg with occasional cheese rice OR 2 slices toast with jelly when he prepares his own meal    Snack (morning)  occasional fruit    Lunch  skips "too lazy"    Snack (afternoon)  ice cream    Dinner  cabbage, corn with butter, pinto beans, corn bread OR hamburger patty without bread, baked potato OR grilled pork chops, roasted vegetables OR baked ziti, garlic bread    Snack (evening)  ice cream    Beverage(s)  Coke Zero, water (but dislikes so only takes with his medication(, juice, lite lemonade, rare black coffee with truvia      Exercise   Exercise Type  ADL's      Patient Education   Previous Diabetes Education  Yes (please comment)    Nutrition management   Role of diet in the treatment of diabetes and the relationship between the three main macronutrients and blood glucose level;Food label reading, portion sizes and measuring food.;Information on hints to eating out and maintain blood glucose control.;Meal options for control of blood glucose level and chronic complications.;Meal timing in regards to the patients' current diabetes medication.    Physical activity and exercise   Role of exercise on diabetes management, blood pressure control and cardiac health.;Helped patient identify appropriate exercises in relation to his/her diabetes, diabetes complications and other health issue.    Medications  Reviewed patients medication for diabetes, action, purpose, timing of dose and side effects.    Chronic complications  Relationship between chronic complications and blood glucose control    Psychosocial adjustment  Role of stress on diabetes;Worked with patient to identify barriers to care and solutions;Identified and addressed patients feelings and concerns about diabetes    Personal strategies to promote health  Lifestyle issues that need to be addressed for better diabetes care      Individualized Goals (developed by patient)   Nutrition  General  guidelines for healthy choices and portions discussed    Physical Activity  Exercise 3-5 times per week;15 minutes per day    Medications  take my medication as prescribed    Monitoring   test my blood glucose as discussed    Problem Solving  other choices than ice cream    Reducing Risk  increase portions of healthy fats    Health Coping  discuss diabetes with (comment) MD, RD, CDE      Post-Education Assessment   Patient understands the diabetes disease and treatment process.  Demonstrates understanding / competency    Patient understands incorporating nutritional management into lifestyle.  Demonstrates understanding / competency    Patient undertands incorporating physical activity into lifestyle.  Demonstrates understanding / competency    Patient understands using medications  safely.  Demonstrates understanding / competency    Patient understands monitoring blood glucose, interpreting and using results  Demonstrates understanding / competency    Patient understands prevention, detection, and treatment of acute complications.  Demonstrates understanding / competency    Patient understands prevention, detection, and treatment of chronic complications.  Demonstrates understanding / competency    Patient understands how to develop strategies to address psychosocial issues.  Demonstrates understanding / competency    Patient understands how to develop strategies to promote health/change behavior.  Needs Review      Outcomes   Expected Outcomes  Demonstrated interest in learning. Expect positive outcomes    Future DMSE  6 months    Program Status  Not Completed       Individualized Plan for Diabetes Self-Management Training:   Learning Objective:  Patient will have a greater understanding of diabetes self-management. Patient education plan is to attend individual and/or group sessions per assessed needs and concerns.   Plan:   Patient Instructions  Increase your Tyler Aas to 30 units  per day per Dr. Cordelia Pen note 01/11/18. Start walking again 15 minutes daily.  Increase this slowly to 30 minutes if tolerated. Make an eye appointment. When you have ice cream or any other snack, put a small portion in a bowl and enjoy away from the TV. Add protein to breakfast Eat lunch daily Drink juice drinks and lemonade with no carbohydrate Consider rechecking your vitamin B-12   Expected Outcomes:  Demonstrated interest in learning. Expect positive outcomes  Education material provided: ADA Diabetes: Your Take Control Guide, A1C conversion sheet, Meal plan card, My Plate and Snack sheet  If problems or questions, patient to contact team via:  Phone  Future DSME appointment: 6 months

## 2018-02-17 LAB — HM DIABETES EYE EXAM

## 2018-02-25 ENCOUNTER — Telehealth: Payer: Self-pay | Admitting: Emergency Medicine

## 2018-02-25 ENCOUNTER — Other Ambulatory Visit: Payer: Self-pay

## 2018-02-25 MED ORDER — INSULIN DEGLUDEC 100 UNIT/ML ~~LOC~~ SOPN: 30.0000 [IU] | PEN_INJECTOR | Freq: Every day | SUBCUTANEOUS | 11 refills | Status: DC

## 2018-02-25 NOTE — Telephone Encounter (Signed)
I have sent to patient's requested pharmacy.  

## 2018-02-25 NOTE — Telephone Encounter (Signed)
Pt called and asked for a refill on his insulin degludec (TRESIBA FLEXTOUCH) 100 UNIT/ML SOPN FlexTouch Pen. Pharmacy is [facility]. Thanks.

## 2018-03-08 ENCOUNTER — Ambulatory Visit: Payer: Medicare Other | Admitting: Neurology

## 2018-03-08 ENCOUNTER — Encounter: Payer: Self-pay | Admitting: Neurology

## 2018-03-08 VITALS — BP 128/86 | HR 114 | Ht 69.0 in | Wt 223.5 lb

## 2018-03-08 DIAGNOSIS — R208 Other disturbances of skin sensation: Secondary | ICD-10-CM

## 2018-03-08 DIAGNOSIS — R269 Unspecified abnormalities of gait and mobility: Secondary | ICD-10-CM | POA: Diagnosis not present

## 2018-03-08 DIAGNOSIS — G4733 Obstructive sleep apnea (adult) (pediatric): Secondary | ICD-10-CM | POA: Diagnosis not present

## 2018-03-08 DIAGNOSIS — E1142 Type 2 diabetes mellitus with diabetic polyneuropathy: Secondary | ICD-10-CM

## 2018-03-08 DIAGNOSIS — G8929 Other chronic pain: Secondary | ICD-10-CM

## 2018-03-08 DIAGNOSIS — M549 Dorsalgia, unspecified: Secondary | ICD-10-CM

## 2018-03-08 DIAGNOSIS — G4719 Other hypersomnia: Secondary | ICD-10-CM

## 2018-03-08 MED ORDER — DULOXETINE HCL 60 MG PO CPEP: 60.0000 mg | ORAL_CAPSULE | Freq: Two times a day (BID) | ORAL | 4 refills | Status: DC

## 2018-03-08 MED ORDER — TAPENTADOL HCL 75 MG PO TABS: 75.0000 mg | ORAL_TABLET | Freq: Three times a day (TID) | ORAL | 0 refills | Status: DC | PRN

## 2018-03-08 MED ORDER — PREGABALIN 200 MG PO CAPS
ORAL_CAPSULE | ORAL | 1 refills | Status: DC
Start: 2018-03-08 — End: 2018-06-28

## 2018-03-08 NOTE — Progress Notes (Signed)
[facility]  PATIENT: Frank [name] DOB: May 23, 1956  _________________________________   HISTORICAL  CHIEF COMPLAINT:  Chief Complaint  Patient presents with  . Follow-up    Patient reports that he is doing about the same.     HISTORY OF PRESENT ILLNESS:  Frank [name] is a 62 year old man with a chronic peripheral neuropathy who had superimposed Guillain Barr syndrome in November 2015.   He has neuropathic pain in the lower legs and also has pain in the hands, worse on the left.   Update 03/08/2018: He feels he is doing about the same.   He feels he gets some benefit from Lyrica, Duloxetine and Nucynta.   He tolerates them well.   No s.e.   No constipation.   The [place] has been reviewed and he is not getting medications from other sites.   No drug seeking behavior.   He has the worse pn in his feet, up to the knees and in his hands.   There is numbness as well.   He feels the symptoms are stable.    Activity like walking increases the pain and rest reduces pain some.     He has OSA but never had the CPAP titration.  Due to high cost, he never had the titration.      He gets drowsy in the evenings and frequently dozes off watching TV  Mood is doing ok.    The combination of Cymbalta and Abilify has helped hte depression and anxiety.      From 10/19/2017: He feels the neuropathic dysesthetic pain is stable.   Lyrica and Nucynta and Duloxetine have helped.    He tolerates them well.    He has not escalated Nucynta use or shown drug seeking behavior.    The [place] was reviewed and is appropriate.     The foot numbness is the same.    His CTS hand pain is about the same and bothers him every day.    Surgery and injections had not helped much.     His OSA remains untreated by his choice.   He does not think he could use CPAP or an oral appliance.   He dozes off some evenings but not everyday (Epworth is high at 14/24).   He has lost 10 pounds since starting insulin.     His depression seems to be ok on Cymbalta and Abilify.    Anxiety is mild on these med's.       From 9/10/20198: Chronic neuropathic pain:  He feels the leg pain is stable.    Pain is up to the knees and in the hands.   Current pain is treated with Lyrica, Duloxetine and Nucynta.  His polyneuropathy pain started a few years before he was diagnosed with NIDDM .   He had GBS in November 2015 and dysesthesias worsened after that.  He has numbness up to his knees and in both hands.    He stumbles but no falls.     He also notes weakness in his feet and hands.     Mood:   He feels the depression is a little worse than last visit.    He initially did better on combination of Cymbalta and Abilify.    He tolerates both of these medications well.    He also takes lorazepam for anxiety.    CTS:   His left hand pain is unchanged and still bothersome at times.     He has  a history of carpal tunnel syndrome.   He had surgery in the past.   He is not wearing the splint much since it did not help.    In the past, carpal tunnel injections have helped a little bit .    OSA:   He was diagnosed with OSA around 2011 but never started CPAP.   He does not want to use CPAP or an oral appliance.   He notes fatigue and sleepiness.   He falls asleep watching TV and in the evenings.         EPWORTH SLEEPINESS SCALE  On a scale of 0 - 3 what is the chance of dozing:  Sitting and Reading:   1 Watching TV:    3 Sitting inactive in a public place: 1 Passenger in car for one hour: 3 Lying down to rest in the afternoon: 3 Sitting and talking to someone: 0 Sitting quietly after lunch:  3 In a car, stopped in traffic:  0  Total (out of 24):  14/24  (mild EDS)   GBS History:   The initial episode of GBS was treated with plasmapheresis. He reports that the diagnosis took a couple weeks and he had several emergency room visits. Initially he was sent to a skilled nursing facility. He was reporting a lot of back pain alongside  the weakness. A lumbar puncture was eventually done showing high protein which led to the dye diagnosis of GBS.  He had severe weakness at the peak and required intubation for respiratory support. At that time, he was unable to move his legs and could barely move his arms. He improved quite a bit after plasmapheresis and was discharged. However, a couple days after discharge he began to feel weak again and return to the emergency room. He was recently admitted for recurrent GBS. For that hospital stay he did not require intubation but did have a facemask oxygen. He also was treated with IVIG the second time around. He improved and was discharged home. He did outpatient physical therapy. He has had a fairly good in movement of his strength and is able to walk independently. However, he has had more dysesthetic pain.   With the exception of more pain, he otherwise feels very close to his pre-GBS baseline.   Of note, he had a flu shot a few weeks before the first episode of GBS.  MRI's from 06/2014 and 07/2014:    Cervical MRI shows spinal stenosis at C4-C5, C5-C6 and C6-C7. There is no spinal cord compression. Thoracic spine shows T8-T9 disc extrusion to the left that does not cause spinal cord compression. There are mild degenerative changes in the lumbar spine. Contrast was not used.   I also reviewed many of the notes and labs from his hospital stay. CSF Protein was greatly elevated at 118 and the CSF white blood cell count was 1.    REVIEW OF SYSTEMS: Constitutional: No fevers, chills, sweats, or change in appetite.   Has sleepiness Eyes: No visual changes, double vision, eye pain Ear, nose and throat: No hearing loss, ear pain, nasal congestion, sore throat Cardiovascular: No chest pain, palpitations Respiratory: No shortness of breath at rest or with exertion.   No wheezes.  He has OSA GastrointestinaI: No nausea, vomiting, diarrhea, abdominal pain, fecal incontinence Genitourinary: No dysuria,  urinary retention or frequency.  No nocturia. Musculoskeletal: No neck pain, back pain Integumentary: No rash, pruritus, skin lesions Neurological: as above Psychiatric: Anxiety and depression at this time.  ADD is stable. Endocrine: No palpitations, diaphoresis, change in appetite, change in weigh or increased thirst Hematologic/Lymphatic: No anemia, purpura, petechiae. Allergic/Immunologic: No itchy/runny eyes, nasal congestion, recent allergic reactions, rashes  ALLERGIES: Allergies  Allergen Reactions  . Codeine Itching    REACTION: Itching  . Metformin And Related Other (See Comments)    GI upset, diarrhea  . Shellfish Allergy     HOME MEDICATIONS:  Current Outpatient Medications:  .  ARIPiprazole (ABILIFY) 10 MG tablet, Take 1 tablet (10 mg total) by mouth daily., Disp: 90 tablet, Rfl: 3 .  aspirin EC 81 MG tablet, Take 1 tablet (81 mg total) by mouth daily., Disp: , Rfl:  .  Blood Glucose Monitoring Suppl (ONE TOUCH ULTRA 2) w/Device KIT, Use as directed, Disp: 1 each, Rfl: 0 .  cyclobenzaprine (FLEXERIL) 5 MG tablet, TAKE 1 TABLET(5 MG) BY MOUTH AT BEDTIME, Disp: 30 tablet, Rfl: 5 .  Dulaglutide (TRULICITY) 1.5 GN/5.6OZ SOPN, 0.5 ml weekly subcutaneous, Disp: 6 mL, Rfl: 11 .  DULoxetine (CYMBALTA) 60 MG capsule, Take 1 capsule (60 mg total) by mouth 2 (two) times daily., Disp: 180 capsule, Rfl: 4 .  famotidine (PEPCID) 40 MG tablet, TAKE 1 TABLET(40 MG) BY MOUTH TWICE DAILY, Disp: 180 tablet, Rfl: 3 .  glipiZIDE (GLUCOTROL XL) 2.5 MG 24 hr tablet, Take 2.5 mg by mouth daily with breakfast., Disp: , Rfl:  .  glucose blood (ONE TOUCH ULTRA TEST) test strip, USE AS DIRECTED, Disp: 100 each, Rfl: 5 .  hydrochlorothiazide (MICROZIDE) 12.5 MG capsule, TAKE 1 CAPSULE(12.5 MG) BY MOUTH DAILY, Disp: 90 capsule, Rfl: 2 .  insulin degludec (TRESIBA FLEXTOUCH) 100 UNIT/ML SOPN FlexTouch Pen, Inject 0.3 mLs (30 Units total) into the skin daily. And pen needles 1/day, Disp: 5 pen, Rfl:  11 .  LORazepam (ATIVAN) 0.5 MG tablet, TAKE 1 TABLET BY MOUTH TWICE DAILY, Disp: 60 tablet, Rfl: 2 .  methylphenidate (CONCERTA) 54 MG PO CR tablet, Take 1 tablet (54 mg total) by mouth every morning., Disp: 30 tablet, Rfl: 0 .  ONETOUCH DELICA LANCETS FINE MISC, USE AS DIRECTED EVERY DAY, Disp: 100 each, Rfl: 1 .  pregabalin (LYRICA) 200 MG capsule, TAKE (1) CAPSULE THREE TIMES DAILY., Disp: 270 capsule, Rfl: 1 .  rosuvastatin (CRESTOR) 20 MG tablet, Take 1 tablet (20 mg total) by mouth daily., Disp: 90 tablet, Rfl: 3 .  tapentadol HCl (NUCYNTA) 75 MG tablet, Take 1 tablet (75 mg total) by mouth 3 (three) times daily as needed., Disp: 90 tablet, Rfl: 0 .  TRADJENTA 5 MG TABS tablet, TAKE 1 TABLET(5 MG) BY MOUTH DAILY, Disp: 90 tablet, Rfl: 3  PAST MEDICAL HISTORY: Past Medical History:  Diagnosis Date  . Abdominal pain, left lower quadrant 06/06/2010  . Abdominal pain, unspecified site 01/19/2009  . ADD 10/09/2008  . ALLERGIC RHINITIS 10/09/2008  . ANXIETY DEPRESSION 02/01/2008  . COLONIC POLYPS 02/01/2008  . DIABETES MELLITUS, TYPE II 05/20/2010  . DYSPNEA 03/12/2010  . ERECTILE DYSFUNCTION 10/09/2008  . ERECTILE DYSFUNCTION, ORGANIC 05/20/2010  . FOOT PAIN, LEFT 05/20/2010  . GERD 02/01/2008  . Guillain Barr syndrome ([place])   . HEMORRHOIDS 02/01/2008  . HIATAL HERNIA 02/01/2008  . HYPERLIPIDEMIA 10/09/2008  . HYPERTENSION 10/09/2008  . MORTON'S NEUROMA 05/20/2010  . Other specified forms of hearing loss 06/27/2009  . PERIPHERAL EDEMA 05/20/2010  . PERIPHERAL NEUROPATHY 05/20/2010  . SLEEP APNEA, OBSTRUCTIVE 02/01/2008  . Stricture and stenosis of esophagus 02/02/2008  . Type II or unspecified type diabetes mellitus without mention  of complication, uncontrolled 11/14/2010  . WRIST PAIN, LEFT 12/05/2009    PAST SURGICAL HISTORY: Past Surgical History:  Procedure Laterality Date  . CARPAL TUNNEL RELEASE    . ESOPHAGEAL DILATION  july 2009  . ESOPHAGOGASTRODUODENOSCOPY N/A 06/27/2014    Procedure: ESOPHAGOGASTRODUODENOSCOPY (EGD);  Surgeon: Lafayette Dragon, MD;  Location: [facility];  Service: Endoscopy;  Laterality: N/A;  . EYE SURGERY     catract surgery on both eyes  . ROTATOR CUFF REPAIR      FAMILY HISTORY: Family History  Problem Relation Age of Onset  . Diabetes Mother   . Heart disease Mother   . Hyperlipidemia Mother   . Depression Mother   . Diabetes Brother   . Colon cancer Neg Hx     SOCIAL HISTORY:  Social History   Socioeconomic History  . Marital status: Single    Spouse name: Not on file  . Number of children: Not on file  . Years of education: Not on file  . Highest education level: Not on file  Occupational History  . 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 Financial resource strain: Not on file  . Food insecurity:    Worry: Not on file    Inability: Not on file  . Transportation needs:    Medical: Not on file    Non-medical: Not on file  Tobacco Use  . Smoking status: Never Smoker  . Smokeless tobacco: Never Used  Substance and Sexual Activity  . Alcohol use: Yes    Comment: occasional  . Drug use: Yes    Types: Methaqualone  . Sexual activity: Yes    Comment: before I got sick  Lifestyle  . Physical activity:    Days per week: Not on file    Minutes per session: Not on file  . Stress: Not on file  Relationships  . Social connections:    Talks on phone: Not on file    Gets together: Not on file    Attends religious service: Not on file    Active member of club or organization: Not on file    Attends meetings of clubs or organizations: Not on file    Relationship status: Not on file  . Intimate partner violence:    Fear of current or ex partner: Not on file    Emotionally abused: Not on file    Physically abused: Not on file    Forced sexual activity: Not on file  Other Topics Concern  . Not on file  Social History Narrative  . Not on file     PHYSICAL EXAM  Vitals:    03/08/18 1036  BP: 128/86  Pulse: (!) 114  Weight: 223 lb 8 oz (101.4 kg)  Height: _0  (1.753 m)    Body mass index is 33.01 kg/m.   General: The patient is well-developed and well-nourished and in no acute distress  Skin: Extremities are without rash. He has mild pedal edema, slightly worse on the left.    HEENT:   His pharynx is [place]atti 4.  Neurologic Exam  Mental status: The patient is alert and oriented x 3 at the time of the examination. The patient has apparent normal recent and remote memory, with an apparently normal attention span and concentration ability.   Speech is normal.  Cranial nerves: Extraocular movements are full. Facial strength and sensation are normal..  Trapezius and sternocleidomastoid strength is normal. No dysarthria is noted.  The  tongue is midline, and the patient has symmetric elevation of the soft palate. No obvious hearing deficits are noted.  Motor:  Muscle bulk is normal. Muscle tone is normal. Strength is 5/5 proximally in the arms and legs. He has mild weakness in the intrinsiic hand muscles and feet/ankles with 4+ strength in interossei and APB, EHL and 4++ in ankle extension   Sensory: Sensory testing is intact in wrists but reduced vib (normal touch) at fingertips.   There is mild reduced vibration at the knees, severe loss at ankles and absent at toes.   Reduced touch/pp below mid shin, very reduced at toes..       Coordination: Cerebellar testing reveals good finger-nose-finger and reduced heel-to-shin bilaterally.  Gait and station: Station is normal.   Gait is normal. Tandem gait is wide. Romberg is positive.   Reflexes: Deep tendon reflexes are symmetric and 1+ bilaterally at deltoid and trace at knees, absent at ankles.        DIAGNOSTIC DATA (LABS, IMAGING, TESTING) - I reviewed patient records, labs, notes, testing and imaging myself where available.  Lab Results  Component Value Date   WBC 9.5 11/16/2017   HGB 16.4  11/16/2017   HCT 46.9 11/16/2017   MCV 89.0 11/16/2017   PLT 197.0 11/16/2017      Component Value Date/Time   NA 139 11/16/2017 0725   K 4.2 11/16/2017 0725   CL 97 11/16/2017 0725   CO2 31 11/16/2017 0725   GLUCOSE 144 (H) 11/16/2017 0725   BUN 17 11/16/2017 0725   CREATININE 1.35 11/16/2017 0725   CALCIUM 9.7 11/16/2017 0725   PROT 7.3 11/16/2017 0725   PROT 7.0 08/30/2015 1157   ALBUMIN 4.3 11/16/2017 0725   AST 24 11/16/2017 0725   ALT 25 11/16/2017 0725   ALKPHOS 75 11/16/2017 0725   BILITOT 1.0 11/16/2017 0725   GFRNONAA >90 08/06/2014 0240   GFRAA >90 08/06/2014 0240   Lab Results  Component Value Date   CHOL 99 11/16/2017   HDL 37.20 (L) 11/16/2017   LDLCALC 23 11/16/2017   LDLDIRECT 37.0 05/25/2017   TRIG 192.0 (H) 11/16/2017   CHOLHDL 3 11/16/2017   Lab Results  Component Value Date   HGBA1C 8.4 (A) 01/11/2018   Lab Results  Component Value Date   VITAMINB12 316 08/30/2015   Lab Results  Component Value Date   TSH 3.65 11/16/2017       ASSESSMENT AND PLAN  Diabetic polyneuropathy associated with type 2 diabetes mellitus (HCC)  Obstructive sleep apnea  Gait disorder  Chronic back pain greater than 3 months duration  Dysesthesia  Excessive daytime sleepiness    1.   His dysesthetic sensations are reasonably controlled on the current medicines.  I will refill the meds.  The controlled substance database has been reviewed and he has been compliant. 2.   Continue Cymbalta and Abilify for mood issues.. 3.  He prefers not to use CPAP or an oral appliance.  We have discussed the risk of OSA.  His total opiate dose is not too high taking Nucynta 75 mg p.o. 3 times daily though I would still prefer him to be on a CPAP treatment.      4.   He will return to see me in 4 months or sooner if he has new or worsening neurologic symptoms.      Render Marley A. [name], MD, PhD 8/41/3244, 01:02 AM Certified in Neurology, Clinical Neurophysiology, Sleep  Medicine, Pain Medicine and Neuroimaging  [facility] [address], [place], [place] [address] [phone]

## 2018-03-16 ENCOUNTER — Other Ambulatory Visit: Payer: Self-pay | Admitting: Internal Medicine

## 2018-03-28 ENCOUNTER — Other Ambulatory Visit: Payer: Self-pay | Admitting: Internal Medicine

## 2018-03-29 ENCOUNTER — Ambulatory Visit: Payer: Medicare Other | Admitting: Endocrinology

## 2018-03-29 ENCOUNTER — Encounter: Payer: Self-pay | Admitting: Endocrinology

## 2018-03-29 VITALS — BP 136/86 | HR 86 | Temp 98.2°F | Ht 69.0 in | Wt 225.0 lb

## 2018-03-29 DIAGNOSIS — IMO0002 Reserved for concepts with insufficient information to code with codable children: Secondary | ICD-10-CM

## 2018-03-29 DIAGNOSIS — E1165 Type 2 diabetes mellitus with hyperglycemia: Secondary | ICD-10-CM

## 2018-03-29 DIAGNOSIS — E1142 Type 2 diabetes mellitus with diabetic polyneuropathy: Secondary | ICD-10-CM

## 2018-03-29 LAB — POCT GLYCOSYLATED HEMOGLOBIN (HGB A1C): Hemoglobin A1C: 8.9 % — AB (ref 4.0–5.6)

## 2018-03-29 MED ORDER — INSULIN DEGLUDEC 100 UNIT/ML ~~LOC~~ SOPN: 40.0000 [IU] | PEN_INJECTOR | Freq: Every day | SUBCUTANEOUS | 11 refills | Status: DC

## 2018-03-29 MED ORDER — FREESTYLE LIBRE 14 DAY READER DEVI: 1.0000 | Freq: Once | 0 refills | Status: AC

## 2018-03-29 MED ORDER — FREESTYLE LIBRE 14 DAY SENSOR MISC: 1.0000 | 3 refills | Status: DC

## 2018-03-29 NOTE — Progress Notes (Signed)
Subjective:    Patient ID: Frank [name], male    DOB: 08-08-1956, 62 y.o.   MRN: [id number]  HPI Pt returns for f/u of diabetes mellitus: DM type: Insulin-requiring type 2 Dx'ed: 3810 Complications: polyneuropathy and renal insuff Therapy: insulin since 1751, trulicity, and 2 oral meds DKA: never Severe hypoglycemia: never.   Pancreatitis: never Pancreatic imaging: normal on 2006 CT Other: he has never been on insulin; he did not tolerate metformin-XR (GI sxs); he takes QD insulin, due to noncompliance. Interval history: He brings a record of his cbg's which I have reviewed today.  It varies from 139-313. There is no trend throughout the day, but it is highest after eating.  he says he never misses the insulin. pt states he feels well in general. Past Medical History:  Diagnosis Date  . Abdominal pain, left lower quadrant 06/06/2010  . Abdominal pain, unspecified site 01/19/2009  . ADD 10/09/2008  . ALLERGIC RHINITIS 10/09/2008  . ANXIETY DEPRESSION 02/01/2008  . COLONIC POLYPS 02/01/2008  . DIABETES MELLITUS, TYPE II 05/20/2010  . DYSPNEA 03/12/2010  . ERECTILE DYSFUNCTION 10/09/2008  . ERECTILE DYSFUNCTION, ORGANIC 05/20/2010  . FOOT PAIN, LEFT 05/20/2010  . GERD 02/01/2008  . Guillain Barr syndrome ([place])   . HEMORRHOIDS 02/01/2008  . HIATAL HERNIA 02/01/2008  . HYPERLIPIDEMIA 10/09/2008  . HYPERTENSION 10/09/2008  . MORTON'S NEUROMA 05/20/2010  . Other specified forms of hearing loss 06/27/2009  . PERIPHERAL EDEMA 05/20/2010  . PERIPHERAL NEUROPATHY 05/20/2010  . SLEEP APNEA, OBSTRUCTIVE 02/01/2008  . Stricture and stenosis of esophagus 02/02/2008  . Type II or unspecified type diabetes mellitus without mention of complication, uncontrolled 11/14/2010  . WRIST PAIN, LEFT 12/05/2009    Past Surgical History:  Procedure Laterality Date  . CARPAL TUNNEL RELEASE    . ESOPHAGEAL DILATION  july 2009  . ESOPHAGOGASTRODUODENOSCOPY N/A 06/27/2014   Procedure:  ESOPHAGOGASTRODUODENOSCOPY (EGD);  Surgeon: Lafayette Dragon, MD;  Location: [facility];  Service: Endoscopy;  Laterality: N/A;  . EYE SURGERY     catract surgery on both eyes  . ROTATOR CUFF REPAIR      Social History   Socioeconomic History  . Marital status: Single    Spouse name: Not on file  . Number of children: Not on file  . Years of education: Not on file  . Highest education level: Not on file  Occupational History  . 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 Financial resource strain: Not on file  . Food insecurity:    Worry: Not on file    Inability: Not on file  . Transportation needs:    Medical: Not on file    Non-medical: Not on file  Tobacco Use  . Smoking status: Never Smoker  . Smokeless tobacco: Never Used  Substance and Sexual Activity  . Alcohol use: Yes    Comment: occasional  . Drug use: Yes    Types: Methaqualone  . Sexual activity: Yes    Comment: before I got sick  Lifestyle  . Physical activity:    Days per week: Not on file    Minutes per session: Not on file  . Stress: Not on file  Relationships  . Social connections:    Talks on phone: Not on file    Gets together: Not on file    Attends religious service: Not on file    Active member of club or organization: Not on file    Attends meetings  of clubs or organizations: Not on file    Relationship status: Not on file  . Intimate partner violence:    Fear of current or ex partner: Not on file    Emotionally abused: Not on file    Physically abused: Not on file    Forced sexual activity: Not on file  Other Topics Concern  . Not on file  Social History Narrative  . Not on file    Current Outpatient Medications on File Prior to Visit  Medication Sig Dispense Refill  . ARIPiprazole (ABILIFY) 10 MG tablet Take 1 tablet (10 mg total) by mouth daily. 90 tablet 3  . aspirin EC 81 MG tablet Take 1 tablet (81 mg total) by mouth daily.    . Blood Glucose  Monitoring Suppl (ONE TOUCH ULTRA 2) w/Device KIT Use as directed 1 each 0  . cyclobenzaprine (FLEXERIL) 5 MG tablet TAKE 1 TABLET(5 MG) BY MOUTH AT BEDTIME 30 tablet 5  . Dulaglutide (TRULICITY) 1.5 SH/7.64YO SOPN 0.5 ml weekly subcutaneous 6 mL 11  . DULoxetine (CYMBALTA) 60 MG capsule Take 1 capsule (60 mg total) by mouth 2 (two) times daily. 180 capsule 4  . famotidine (PEPCID) 40 MG tablet TAKE 1 TABLET(40 MG) BY MOUTH TWICE DAILY 180 tablet 3  . glipiZIDE (GLUCOTROL XL) 2.5 MG 24 hr tablet Take 2.5 mg by mouth daily with breakfast.    . glucose blood (ONE TOUCH ULTRA TEST) test strip USE AS DIRECTED 100 each 5  . glucose blood (ONE TOUCH ULTRA TEST) test strip USE AS DIRECTED 100 each 5  . hydrochlorothiazide (MICROZIDE) 12.5 MG capsule TAKE 1 CAPSULE(12.5 MG) BY MOUTH DAILY 90 capsule 2  . LORazepam (ATIVAN) 0.5 MG tablet TAKE 1 TABLET BY MOUTH TWICE DAILY 60 tablet 5  . methylphenidate (CONCERTA) 54 MG PO CR tablet Take 1 tablet (54 mg total) by mouth every morning. 30 tablet 0  . ONETOUCH DELICA LANCETS FINE MISC USE AS DIRECTED EVERY DAY 100 each 5  . pregabalin (LYRICA) 200 MG capsule TAKE (1) CAPSULE THREE TIMES DAILY. 270 capsule 1  . rosuvastatin (CRESTOR) 20 MG tablet Take 1 tablet (20 mg total) by mouth daily. 90 tablet 3  . tapentadol HCl (NUCYNTA) 75 MG tablet Take 1 tablet (75 mg total) by mouth 3 (three) times daily as needed. 90 tablet 0  . TRADJENTA 5 MG TABS tablet TAKE 1 TABLET(5 MG) BY MOUTH DAILY 90 tablet 3   No current facility-administered medications on file prior to visit.     Allergies  Allergen Reactions  . Codeine Itching    REACTION: Itching  . Metformin And Related Other (See Comments)    GI upset, diarrhea  . Shellfish Allergy     Family History  Problem Relation Age of Onset  . Diabetes Mother   . Heart disease Mother   . Hyperlipidemia Mother   . Depression Mother   . Diabetes Brother   . Colon cancer Neg Hx     BP 136/86 (BP Location:  Right Arm, Patient Position: Sitting, Cuff Size: Normal)   Pulse 86   Temp 98.2 F (36.8 C) (Oral)   Ht _0  (1.753 m)   Wt 225 lb (102.1 kg)   SpO2 95%   BMI 33.23 kg/m    Review of Systems He denies hypoglycemia    Objective:   Physical Exam VITAL SIGNS:  See vs page GENERAL: no distress Pulses: foot pulses are intact bilaterally.   MSK: no deformity of the  feet or ankles.  CV: no edema of the legs or ankles Skin:  no ulcer on the feet or ankles, but the skin is dry.  normal color and temp on the feet and ankles Neuro: sensation is intact to touch on the feet and ankles.   Ext: There is bilateral onychomycosis of the toenails   A1c=8.9%    Assessment & Plan:  Insulin-requiring type 2 DM, with polyneuropathy: worse Renal insuff: he will a very long duration of action of tresiba, so he is at risk for nocturnal hypoglycemia: we'll have to titrate tresiba slowly.    Patient Instructions  Please increase the tresiba to 40 units daily, and: continue the same other diabetes medications for now. check your blood sugar once a day.  vary the time of day when you check, between before the 3 meals, and at bedtime.  also check if you have symptoms of your blood sugar being too high or too low.  please keep a record of the readings and bring it to your next appointment here (or you can bring the meter itself).  You can write it on any piece of paper.  please call us sooner if your blood sugar goes below 70, or if you have a lot of readings over 200.  Please come back for a follow-up appointment in 2 months.

## 2018-03-29 NOTE — Patient Instructions (Addendum)
Please increase the tresiba to 40 units daily, and: continue the same other diabetes medications for now. check your blood sugar once a day.  vary the time of day when you check, between before the 3 meals, and at bedtime.  also check if you have symptoms of your blood sugar being too high or too low.  please keep a record of the readings and bring it to your next appointment here (or you can bring the meter itself).  You can write it on any piece of paper.  please call us sooner if your blood sugar goes below 70, or if you have a lot of readings over 200.  Please come back for a follow-up appointment in 2 months.

## 2018-04-05 MED ORDER — LORAZEPAM 0.5 MG PO TABS: 0.5000 mg | ORAL_TABLET | Freq: Two times a day (BID) | ORAL | 5 refills | Status: DC

## 2018-04-05 NOTE — Telephone Encounter (Signed)
Done erx 

## 2018-04-05 NOTE — Addendum Note (Signed)
Addended by: Biagio Borg on: 04/05/2018 12:34 PM   Modules accepted: Orders

## 2018-04-05 NOTE — Telephone Encounter (Signed)
Pt states that [facility] did not receive the refill of LORazepam (ATIVAN) 0.5 MG tablet  And would like to see if this can be resent.

## 2018-04-14 ENCOUNTER — Other Ambulatory Visit (INDEPENDENT_AMBULATORY_CARE_PROVIDER_SITE_OTHER): Payer: Medicare Other

## 2018-04-14 DIAGNOSIS — IMO0002 Reserved for concepts with insufficient information to code with codable children: Secondary | ICD-10-CM

## 2018-04-14 DIAGNOSIS — E1165 Type 2 diabetes mellitus with hyperglycemia: Secondary | ICD-10-CM

## 2018-04-14 DIAGNOSIS — E1142 Type 2 diabetes mellitus with diabetic polyneuropathy: Secondary | ICD-10-CM | POA: Diagnosis not present

## 2018-04-14 LAB — LIPID PANEL
CHOLESTEROL: 110 mg/dL (ref 0–200)
HDL: 38.9 mg/dL — ABNORMAL LOW (ref >39.00)
NonHDL: 70.88
Total CHOL/HDL Ratio: 3
Triglycerides: 212 mg/dL — ABNORMAL HIGH (ref 0.0–149.0)
VLDL: 42.4 mg/dL — AB (ref 0.0–40.0)

## 2018-04-14 LAB — BASIC METABOLIC PANEL
BUN: 22 mg/dL (ref 6–23)
CALCIUM: 9.4 mg/dL (ref 8.4–10.5)
CO2: 33 mEq/L — ABNORMAL HIGH (ref 19–32)
CREATININE: 1.28 mg/dL (ref 0.40–1.50)
Chloride: 100 mEq/L (ref 96–112)
GFR: 60.01 mL/min (ref >60.00)
Glucose, Bld: 185 mg/dL — ABNORMAL HIGH (ref 70–99)
POTASSIUM: 4.7 meq/L (ref 3.5–5.1)
Sodium: 139 mEq/L (ref 135–145)

## 2018-04-14 LAB — HEPATIC FUNCTION PANEL
ALT: 17 U/L (ref 0–53)
AST: 16 U/L (ref 0–37)
Albumin: 4.1 g/dL (ref 3.5–5.2)
Alkaline Phosphatase: 68 U/L (ref 39–117)
BILIRUBIN DIRECT: 0.1 mg/dL (ref 0.0–0.3)
TOTAL PROTEIN: 7.2 g/dL (ref 6.0–8.3)
Total Bilirubin: 0.9 mg/dL (ref 0.2–1.2)

## 2018-04-14 LAB — LDL CHOLESTEROL, DIRECT: LDL DIRECT: 50 mg/dL

## 2018-04-14 LAB — HEMOGLOBIN A1C: Hgb A1c MFr Bld: 8.8 % — ABNORMAL HIGH (ref 4.6–6.5)

## 2018-04-19 ENCOUNTER — Ambulatory Visit: Payer: Medicare Other | Admitting: Internal Medicine

## 2018-04-19 ENCOUNTER — Encounter: Payer: Self-pay | Admitting: Internal Medicine

## 2018-04-19 VITALS — BP 100/54 | HR 86 | Temp 98.2°F | Resp 16 | Wt 223.0 lb

## 2018-04-19 DIAGNOSIS — Z1159 Encounter for screening for other viral diseases: Secondary | ICD-10-CM

## 2018-04-19 DIAGNOSIS — E1142 Type 2 diabetes mellitus with diabetic polyneuropathy: Secondary | ICD-10-CM | POA: Diagnosis not present

## 2018-04-19 DIAGNOSIS — E785 Hyperlipidemia, unspecified: Secondary | ICD-10-CM | POA: Diagnosis not present

## 2018-04-19 DIAGNOSIS — I10 Essential (primary) hypertension: Secondary | ICD-10-CM | POA: Diagnosis not present

## 2018-04-19 DIAGNOSIS — Z0000 Encounter for general adult medical examination without abnormal findings: Secondary | ICD-10-CM

## 2018-04-19 NOTE — Assessment & Plan Note (Signed)
stable overall by history and exam, recent data reviewed with pt, and pt to continue medical treatment as before,  to f/u any worsening symptoms or concerns  

## 2018-04-19 NOTE — Patient Instructions (Signed)
Please continue all other medications as before, and refills have been done if requested.  Please have the pharmacy call with any other refills you may need.  Please continue your efforts at being more active, low cholesterol diet, and weight control.  You are otherwise up to date with prevention measures today.  Please keep your appointments with your specialists as you may have planned  Please return in 1 year for your yearly visit, or sooner if needed, with Lab testing done 3-5 days before  

## 2018-04-19 NOTE — Progress Notes (Signed)
Subjective:    Patient ID: Frank [name], male    DOB: 1956/02/15, 62 y.o.   MRN: [id number]  HPI  Here to f/u with husband; overall doing ok,  Pt denies chest pain, increasing sob or doe, wheezing, orthopnea, PND, increased LE swelling, palpitations, dizziness or syncope.  Pt denies new neurological symptoms such as new headache, or facial or extremity weakness or numbness.  Pt denies polydipsia, polyuria, or low sugar episode.  Pt states overall good compliance with meds, mostly trying to follow appropriate diet but often cheats per husband.  No new complaints Past Medical History:  Diagnosis Date  . Abdominal pain, left lower quadrant 06/06/2010  . Abdominal pain, unspecified site 01/19/2009  . ADD 10/09/2008  . ALLERGIC RHINITIS 10/09/2008  . ANXIETY DEPRESSION 02/01/2008  . COLONIC POLYPS 02/01/2008  . DIABETES MELLITUS, TYPE II 05/20/2010  . DYSPNEA 03/12/2010  . ERECTILE DYSFUNCTION 10/09/2008  . ERECTILE DYSFUNCTION, ORGANIC 05/20/2010  . FOOT PAIN, LEFT 05/20/2010  . GERD 02/01/2008  . Guillain Barr syndrome ([place])   . HEMORRHOIDS 02/01/2008  . HIATAL HERNIA 02/01/2008  . HYPERLIPIDEMIA 10/09/2008  . HYPERTENSION 10/09/2008  . MORTON'S NEUROMA 05/20/2010  . Other specified forms of hearing loss 06/27/2009  . PERIPHERAL EDEMA 05/20/2010  . PERIPHERAL NEUROPATHY 05/20/2010  . SLEEP APNEA, OBSTRUCTIVE 02/01/2008  . Stricture and stenosis of esophagus 02/02/2008  . Type II or unspecified type diabetes mellitus without mention of complication, uncontrolled 11/14/2010  . WRIST PAIN, LEFT 12/05/2009   Past Surgical History:  Procedure Laterality Date  . CARPAL TUNNEL RELEASE    . ESOPHAGEAL DILATION  july 2009  . ESOPHAGOGASTRODUODENOSCOPY N/A 06/27/2014   Procedure: ESOPHAGOGASTRODUODENOSCOPY (EGD);  Surgeon: Lafayette Dragon, MD;  Location: [facility];  Service: Endoscopy;  Laterality: N/A;  . EYE SURGERY     catract surgery on both eyes  . ROTATOR CUFF REPAIR      reports that he has  never smoked. He has never used smokeless tobacco. He reports that he drinks alcohol. He reports that he has current or past drug history. Drug: Methaqualone. family history includes Depression in his mother; Diabetes in his brother and mother; Heart disease in his mother; Hyperlipidemia in his mother. Allergies  Allergen Reactions  . Codeine Itching    REACTION: Itching  . Influenza Vaccines     Due to hx of GBS  . Metformin And Related Other (See Comments)    GI upset, diarrhea  . Shellfish Allergy    Current Outpatient Medications on File Prior to Visit  Medication Sig Dispense Refill  . ARIPiprazole (ABILIFY) 10 MG tablet Take 1 tablet (10 mg total) by mouth daily. 90 tablet 3  . aspirin EC 81 MG tablet Take 1 tablet (81 mg total) by mouth daily.    . Blood Glucose Monitoring Suppl (ONE TOUCH ULTRA 2) w/Device KIT Use as directed 1 each 0  . Continuous Blood Gluc Sensor (FREESTYLE LIBRE 14 DAY SENSOR) MISC 1 Device by Does not apply route every 14 (fourteen) days. 6 each 3  . cyclobenzaprine (FLEXERIL) 5 MG tablet TAKE 1 TABLET(5 MG) BY MOUTH AT BEDTIME 30 tablet 5  . Dulaglutide (TRULICITY) 1.5 CW/2.3JS SOPN 0.5 ml weekly subcutaneous 6 mL 11  . DULoxetine (CYMBALTA) 60 MG capsule Take 1 capsule (60 mg total) by mouth 2 (two) times daily. 180 capsule 4  . famotidine (PEPCID) 40 MG tablet TAKE 1 TABLET(40 MG) BY MOUTH TWICE DAILY 180 tablet 3  . glipiZIDE (GLUCOTROL XL) 2.5  MG 24 hr tablet Take 2.5 mg by mouth daily with breakfast.    . glucose blood (ONE TOUCH ULTRA TEST) test strip USE AS DIRECTED 100 each 5  . glucose blood (ONE TOUCH ULTRA TEST) test strip USE AS DIRECTED 100 each 5  . hydrochlorothiazide (MICROZIDE) 12.5 MG capsule TAKE 1 CAPSULE(12.5 MG) BY MOUTH DAILY 90 capsule 2  . insulin degludec (TRESIBA FLEXTOUCH) 100 UNIT/ML SOPN FlexTouch Pen Inject 0.4 mLs (40 Units total) into the skin daily. And pen needles 1/day 5 pen 11  . LORazepam (ATIVAN) 0.5 MG tablet Take 1  tablet (0.5 mg total) by mouth 2 (two) times daily. 60 tablet 5  . methylphenidate (CONCERTA) 54 MG PO CR tablet Take 1 tablet (54 mg total) by mouth every morning. 30 tablet 0  . ONETOUCH DELICA LANCETS FINE MISC USE AS DIRECTED EVERY DAY 100 each 5  . pregabalin (LYRICA) 200 MG capsule TAKE (1) CAPSULE THREE TIMES DAILY. 270 capsule 1  . rosuvastatin (CRESTOR) 20 MG tablet Take 1 tablet (20 mg total) by mouth daily. 90 tablet 3  . tapentadol HCl (NUCYNTA) 75 MG tablet Take 1 tablet (75 mg total) by mouth 3 (three) times daily as needed. 90 tablet 0  . TRADJENTA 5 MG TABS tablet TAKE 1 TABLET(5 MG) BY MOUTH DAILY 90 tablet 3   No current facility-administered medications on file prior to visit.    Review of Systems  Constitutional: Negative for other unusual diaphoresis or sweats HENT: Negative for ear discharge or swelling Eyes: Negative for other worsening visual disturbances Respiratory: Negative for stridor or other swelling  Gastrointestinal: Negative for worsening distension or other blood Genitourinary: Negative for retention or other urinary change Musculoskeletal: Negative for other MSK pain or swelling Skin: Negative for color change or other new lesions Neurological: Negative for worsening tremors and other numbness  Psychiatric/Behavioral: Negative for worsening agitation or other fatigue All other system neg per pt    Objective:   Physical Exam BP (!) 100/54   Pulse 86   Temp 98.2 F (36.8 C) (Oral)   Resp 16   Wt 223 lb (101.2 kg)   SpO2 96%   BMI 32.93 kg/m  VS noted,  Constitutional: Pt appears in NAD HENT: Head: NCAT.  Right Ear: External ear normal.  Left Ear: External ear normal.  Eyes: . Pupils are equal, round, and reactive to light. Conjunctivae and EOM are normal Nose: without d/c or deformity Neck: Neck supple. Gross normal ROM Cardiovascular: Normal rate and regular rhythm.   Pulmonary/Chest: Effort normal and breath sounds without rales or  wheezing.  Abd:  Soft, NT, ND, + BS, no organomegaly Neurological: Pt is alert. At baseline orientation, motor grossly intact Skin: Skin is warm. No rashes, other new lesions, no LE edema Psychiatric: Pt behavior is normal without agitation  No other exam findings       Assessment & Plan:

## 2018-04-21 ENCOUNTER — Other Ambulatory Visit: Payer: Self-pay | Admitting: Neurology

## 2018-05-27 ENCOUNTER — Telehealth: Payer: Self-pay | Admitting: Neurology

## 2018-05-27 MED ORDER — TAPENTADOL HCL 75 MG PO TABS: 75.0000 mg | ORAL_TABLET | Freq: Three times a day (TID) | ORAL | 0 refills | Status: DC | PRN

## 2018-05-27 NOTE — Telephone Encounter (Signed)
Pt requesting refills for tapentadol HCl (NUCYNTA) 75 MG tablet aware the office will close at noon tomorrow 10/18

## 2018-05-27 NOTE — Telephone Encounter (Signed)
Rx. awaiting RAS sig/fim 

## 2018-05-27 NOTE — Telephone Encounter (Signed)
Rx. up front [facility]/fim 

## 2018-05-31 ENCOUNTER — Ambulatory Visit: Payer: Medicare Other | Admitting: Endocrinology

## 2018-05-31 ENCOUNTER — Encounter: Payer: Self-pay | Admitting: Endocrinology

## 2018-05-31 VITALS — BP 134/76 | HR 82 | Ht 69.0 in | Wt 228.4 lb

## 2018-05-31 DIAGNOSIS — E1142 Type 2 diabetes mellitus with diabetic polyneuropathy: Secondary | ICD-10-CM

## 2018-05-31 LAB — POCT GLYCOSYLATED HEMOGLOBIN (HGB A1C): Hemoglobin A1C: 8.6 % — AB (ref 4.0–5.6)

## 2018-05-31 MED ORDER — INSULIN DEGLUDEC 100 UNIT/ML ~~LOC~~ SOPN: 50.0000 [IU] | PEN_INJECTOR | Freq: Every day | SUBCUTANEOUS | 11 refills | Status: DC

## 2018-05-31 NOTE — Patient Instructions (Addendum)
Please increase the tresiba to 50 units daily, and: continue the same other diabetes medications for now. check your blood sugar once a day.  vary the time of day when you check, between before the 3 meals, and at bedtime.  also check if you have symptoms of your blood sugar being too high or too low.  please keep a record of the readings and bring it to your next appointment here (or you can bring the meter itself).  You can write it on any piece of paper.  please call us sooner if your blood sugar goes below 70, or if you have a lot of readings over 200.  Please come back for a follow-up appointment in 3 months.

## 2018-05-31 NOTE — Progress Notes (Signed)
Subjective:    Patient ID: Frank [name], male    DOB: [birth year], 62 y.o.   MRN: [id number]  HPI Pt returns for f/u of diabetes mellitus: DM type: Insulin-requiring type 2 Dx'ed: 3500 Complications: polyneuropathy and renal insuff Therapy: insulin since 9381, trulicity, and 2 oral meds DKA: never Severe hypoglycemia: never.   Pancreatitis: never Pancreatic imaging: normal on 2006 CT Other: he has never been on insulin; he did not tolerate metformin-XR (GI sxs); he takes QD insulin, due to noncompliance; pt injects his own insulin.   Interval history: He brings a record of his cbg's which I have reviewed today.  It varies from 149-291. There is no trend throughout the day, but it is highest after eating.  he says he never misses the insulin. pt states he feels well in general.   Past Medical History:  Diagnosis Date  . Abdominal pain, left lower quadrant 06/06/2010  . Abdominal pain, unspecified site 01/19/2009  . ADD 10/09/2008  . ALLERGIC RHINITIS 10/09/2008  . ANXIETY DEPRESSION 02/01/2008  . COLONIC POLYPS 02/01/2008  . DIABETES MELLITUS, TYPE II 05/20/2010  . DYSPNEA 03/12/2010  . ERECTILE DYSFUNCTION 10/09/2008  . ERECTILE DYSFUNCTION, ORGANIC 05/20/2010  . FOOT PAIN, LEFT 05/20/2010  . GERD 02/01/2008  . Guillain Barr syndrome ([place])   . HEMORRHOIDS 02/01/2008  . HIATAL HERNIA 02/01/2008  . HYPERLIPIDEMIA 10/09/2008  . HYPERTENSION 10/09/2008  . MORTON'S NEUROMA 05/20/2010  . Other specified forms of hearing loss 06/27/2009  . PERIPHERAL EDEMA 05/20/2010  . PERIPHERAL NEUROPATHY 05/20/2010  . SLEEP APNEA, OBSTRUCTIVE 02/01/2008  . Stricture and stenosis of esophagus 02/02/2008  . Type II or unspecified type diabetes mellitus without mention of complication, uncontrolled 11/14/2010  . WRIST PAIN, LEFT 12/05/2009    Past Surgical History:  Procedure Laterality Date  . CARPAL TUNNEL RELEASE    . ESOPHAGEAL DILATION  july 2009  . ESOPHAGOGASTRODUODENOSCOPY N/A 06/27/2014   Procedure: ESOPHAGOGASTRODUODENOSCOPY (EGD);  Surgeon: Lafayette Dragon, MD;  Location: [facility];  Service: Endoscopy;  Laterality: N/A;  . EYE SURGERY     catract surgery on both eyes  . ROTATOR CUFF REPAIR      Social History   Socioeconomic History  . Marital status: Single    Spouse name: Not on file  . Number of children: Not on file  . Years of education: Not on file  . Highest education level: Not on file  Occupational History  . Occupation: Musician: [employer]  Social Needs  . Financial resource strain: Not on file  . Food insecurity:    Worry: Not on file    Inability: Not on file  . Transportation needs:    Medical: Not on file    Non-medical: Not on file  Tobacco Use  . Smoking status: Never Smoker  . Smokeless tobacco: Never Used  Substance and Sexual Activity  . Alcohol use: Yes    Comment: occasional  . Drug use: Yes    Types: Methaqualone  . Sexual activity: Yes    Comment: before I got sick  Lifestyle  . Physical activity:    Days per week: Not on file    Minutes per session: Not on file  . Stress: Not on file  Relationships  . Social connections:    Talks on phone: Not on file    Gets together: Not on file    Attends religious service: Not on file    Active member of club or organization:  Not on file    Attends meetings of clubs or organizations: Not on file    Relationship status: Not on file  . Intimate partner violence:    Fear of current or ex partner: Not on file    Emotionally abused: Not on file    Physically abused: Not on file    Forced sexual activity: Not on file  Other Topics Concern  . Not on file  Social History Narrative  . Not on file    Current Outpatient Medications on File Prior to Visit  Medication Sig Dispense Refill  . ARIPiprazole (ABILIFY) 10 MG tablet Take 1 tablet (10 mg total) by mouth daily. 90 tablet 3  . aspirin EC 81 MG tablet Take 1 tablet (81 mg total) by mouth daily.    .  Continuous Blood Gluc Sensor (FREESTYLE LIBRE 14 DAY SENSOR) MISC 1 Device by Does not apply route every 14 (fourteen) days. 6 each 3  . cyclobenzaprine (FLEXERIL) 5 MG tablet TAKE 1 TABLET(5 MG) BY MOUTH AT BEDTIME 30 tablet 11  . Dulaglutide (TRULICITY) 1.5 IO/0.3TD SOPN 0.5 ml weekly subcutaneous 6 mL 11  . DULoxetine (CYMBALTA) 60 MG capsule Take 1 capsule (60 mg total) by mouth 2 (two) times daily. 180 capsule 4  . famotidine (PEPCID) 40 MG tablet TAKE 1 TABLET(40 MG) BY MOUTH TWICE DAILY 180 tablet 3  . glipiZIDE (GLUCOTROL XL) 2.5 MG 24 hr tablet Take 2.5 mg by mouth daily with breakfast.    . glucose blood (ONE TOUCH ULTRA TEST) test strip USE AS DIRECTED 100 each 5  . hydrochlorothiazide (MICROZIDE) 12.5 MG capsule TAKE 1 CAPSULE(12.5 MG) BY MOUTH DAILY 90 capsule 2  . LORazepam (ATIVAN) 0.5 MG tablet Take 1 tablet (0.5 mg total) by mouth 2 (two) times daily. 60 tablet 5  . methylphenidate (CONCERTA) 54 MG PO CR tablet Take 1 tablet (54 mg total) by mouth every morning. 30 tablet 0  . ONETOUCH DELICA LANCETS FINE MISC USE AS DIRECTED EVERY DAY 100 each 5  . pregabalin (LYRICA) 200 MG capsule TAKE (1) CAPSULE THREE TIMES DAILY. 270 capsule 1  . rosuvastatin (CRESTOR) 20 MG tablet Take 1 tablet (20 mg total) by mouth daily. 90 tablet 3  . tapentadol HCl (NUCYNTA) 75 MG tablet Take 1 tablet (75 mg total) by mouth 3 (three) times daily as needed. 90 tablet 0  . TRADJENTA 5 MG TABS tablet TAKE 1 TABLET(5 MG) BY MOUTH DAILY 90 tablet 3   No current facility-administered medications on file prior to visit.     Allergies  Allergen Reactions  . Codeine Itching    REACTION: Itching  . Influenza Vaccines     Due to hx of GBS  . Metformin And Related Other (See Comments)    GI upset, diarrhea  . Shellfish Allergy     Family History  Problem Relation Age of Onset  . Diabetes Mother   . Heart disease Mother   . Hyperlipidemia Mother   . Depression Mother   . Diabetes Brother   . Colon  cancer Neg Hx     BP 134/76 (BP Location: Left Arm, Patient Position: Sitting, Cuff Size: Large)   Pulse 82   Ht 5\' 9"  (1.753 m)   Wt 228 lb 6.4 oz (103.6 kg)   SpO2 94%   BMI 33.73 kg/m    Review of Systems He denies hypoglycemia.      Objective:   Physical Exam VITAL SIGNS:  See vs page GENERAL: no  distress Pulses: foot pulses are intact bilaterally.   MSK: no deformity of the feet or ankles.  CV: no edema of the legs or ankles Skin:  no ulcer on the feet or ankles, but the skin is dry.  normal color and temp on the feet and ankles Neuro: sensation is intact to touch on the feet and ankles.   Ext: There is bilateral onychomycosis of the toenails   A1c=8.6%     Assessment & Plan:  Insulin-requiring type 2 DM, with renal insuff: he needs increased rx   Patient Instructions  Please increase the tresiba to 50 units daily, and: continue the same other diabetes medications for now. check your blood sugar once a day.  vary the time of day when you check, between before the 3 meals, and at bedtime.  also check if you have symptoms of your blood sugar being too high or too low.  please keep a record of the readings and bring it to your next appointment here (or you can bring the meter itself).  You can write it on any piece of paper.  please call us sooner if your blood sugar goes below 70, or if you have a lot of readings over 200.  Please come back for a follow-up appointment in 3 months.

## 2018-06-14 ENCOUNTER — Encounter: Payer: Self-pay | Admitting: Dietician

## 2018-06-14 ENCOUNTER — Encounter: Payer: Medicare Other | Attending: Endocrinology | Admitting: Dietician

## 2018-06-14 DIAGNOSIS — IMO0002 Reserved for concepts with insufficient information to code with codable children: Secondary | ICD-10-CM

## 2018-06-14 DIAGNOSIS — Z713 Dietary counseling and surveillance: Secondary | ICD-10-CM | POA: Insufficient documentation

## 2018-06-14 DIAGNOSIS — E1142 Type 2 diabetes mellitus with diabetic polyneuropathy: Secondary | ICD-10-CM | POA: Diagnosis present

## 2018-06-14 DIAGNOSIS — E1165 Type 2 diabetes mellitus with hyperglycemia: Secondary | ICD-10-CM | POA: Diagnosis present

## 2018-06-14 DIAGNOSIS — Z6832 Body mass index (BMI) 32.0-32.9, adult: Secondary | ICD-10-CM | POA: Diagnosis not present

## 2018-06-14 NOTE — Progress Notes (Signed)
Diabetes Self-Management Education  Visit Type:  Follow-up  Appt. Start Time: 1200 Appt. End Time: 1230  06/14/2018  [name], identified by name and date of birth, is a 62 y.o. male with a diagnosis of Diabetes:  Type 2.  Other history includes polyneuropathy, renal insufficiency, Guillain Barre syndrome October 2015 resulting in paralyzation for 6 months and decreased weight to 150 lbs.  Medications include:  Tresiba 50 units each am, Trulicity, tradjenta  Labs include (11/16/17):  GFR 56,  A1C 9.7% which was decreased from 10.2% 10/05/17.  Last A1C was 8.4%  Weight hx: 150 lbs 2016 following Guillain Barre syndrome 227 lbs 11/04/17 223 lbs 02/2018 222 lbs today  Patient lives with Frank [name].  Frank [name] prepares the meals and helps him in other ways including checking his feet and making sure he is taking care of himself.  Frank [name] also does the shopping and Frank [name] often asks for increased ice cream.  Frank [name] prepares balanced meals for dinner and days he is off but otherwise patient eats poorly during the days that Frank [name] works and Patient states that he is just "too lazy to prepare food". 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 He does not like any meat except for beef.  Recent dental work and patient has been eating more soft foods.  He has not decreased his sweet intake and probably increased it.  He has not been walking.  Encouraged as he enjoys colder weather.  Today, discussed healthy meal options to include more protein and balanced meals.  Patient is not willing to make changes.   ASSESSMENT  Weight 222 lb 12.8 oz (101.1 kg). Body mass index is 32.9 kg/m.   Diabetes Self-Management Education - 06/14/18 1210      Psychosocial Assessment   Self-management support  Doctor's office;Friends    Learning Readiness  Ready      Pre-Education Assessment   Patient understands the diabetes disease and treatment process.  Demonstrates understanding / competency    Patient  understands incorporating nutritional management into lifestyle.  Needs Review    Patient undertands incorporating physical activity into lifestyle.  Needs Review    Patient understands using medications safely.  Demonstrates understanding / competency    Patient understands monitoring blood glucose, interpreting and using results  Needs Review    Patient understands prevention, detection, and treatment of acute complications.  Demonstrates understanding / competency    Patient understands prevention, detection, and treatment of chronic complications.  Demonstrates understanding / competency    Patient understands how to develop strategies to address psychosocial issues.  Needs Review    Patient understands how to develop strategies to promote health/change behavior.  Needs Review      Complications   How often do you check your blood sugar?  1-2 times/day    Fasting Blood glucose range (mg/dL)  >200      Dietary Intake   Breakfast  regular yogurt OR scrambled eggs with toast with jelly    Lunch  tomato soup or Mac and cheese, canned apples    Snack (afternoon)  regular jello    Dinner  chicken and dumplings without chicken OR spaghetti OR steamed vegetables, pintos    Snack (evening)  bread pudding OR ice cream OR sugar free pudding    Beverage(s)  1-2 glasses water, coke zero, lite carbohydrate, cranberry juice occasionally, rare black coffee with truvia      Exercise   Exercise Type  ADL's   occasionally walks 2 blocks and back  Patient Education   Previous Diabetes Education  Yes (please comment)   3 months ago with myself   Nutrition management   Role of diet in the treatment of diabetes and the relationship between the three main macronutrients and blood glucose level;Meal options for control of blood glucose level and chronic complications.    Physical activity and exercise   Role of exercise on diabetes management, blood pressure control and cardiac health.;Helped patient  identify appropriate exercises in relation to his/her diabetes, diabetes complications and other health issue.    Medications  Reviewed patients medication for diabetes, action, purpose, timing of dose and side effects.    Chronic complications  Relationship between chronic complications and blood glucose control    Psychosocial adjustment  Worked with patient to identify barriers to care and solutions    Personal strategies to promote health  Lifestyle issues that need to be addressed for better diabetes care      Individualized Goals (developed by patient)   Nutrition  General guidelines for healthy choices and portions discussed    Physical Activity  Exercise 5-7 days per week;15 minutes per day    Medications  take my medication as prescribed    Monitoring   test my blood glucose as discussed    Reducing Risk  treat hypoglycemia with 15 grams of carbs if blood glucose less than 23m/dL    Health Coping  discuss diabetes with (comment)   MD, RD, CDE     Patient Self-Evaluation of Goals - Patient rates self as meeting previously set goals (% of time)   Nutrition  < 25%    Physical Activity  < 25%    Medications  >75%    Monitoring  >75%    Problem Solving  25 - 50%    Reducing Risk  25 - 50%    Health Coping  50 - 75 %      Post-Education Assessment   Patient understands the diabetes disease and treatment process.  Demonstrates understanding / competency    Patient understands incorporating nutritional management into lifestyle.  Demonstrates understanding / competency    Patient undertands incorporating physical activity into lifestyle.  Demonstrates understanding / competency    Patient understands using medications safely.  Demonstrates understanding / competency    Patient understands monitoring blood glucose, interpreting and using results  Demonstrates understanding / competency    Patient understands prevention, detection, and treatment of acute complications.  Demonstrates  understanding / competency    Patient understands prevention, detection, and treatment of chronic complications.  Demonstrates understanding / competency    Patient understands how to develop strategies to address psychosocial issues.  Demonstrates understanding / competency    Patient understands how to develop strategies to promote health/change behavior.  Demonstrates understanding / competency      Outcomes   Program Status  Completed      Subsequent Visit   Since your last visit have you experienced any weight changes?  Loss    Weight Loss (lbs)  1    Since your last visit, are you checking your blood glucose at least once a day?  Yes       Learning Objective:  Patient will have a greater understanding of diabetes self-management. Patient education plan is to attend individual and/or group sessions per assessed needs and concerns.   Plan:   Patient Instructions  -Choose beverages with zero carbohydrates -Sugar free jello, sugar free pudding -Decrease your portion size of any dessert or anything  sweet.  Decrease how often you eat these. -Eat your dessert after your vegetables -Start walking    Expected Outcomes:  Demonstrated limited interest in learning.  Expect minimal changes  Education material provided: Breakfast ideas, magazine discount card  If problems or questions, patient to contact team via:  Phone  Future DSME appointment: - PRN

## 2018-06-14 NOTE — Patient Instructions (Addendum)
-  Choose beverages with zero carbohydrates -Sugar free jello, sugar free pudding -Decrease your portion size of any dessert or anything sweet.  Decrease how often you eat these. -Eat your dessert after your vegetables -Start walking

## 2018-06-16 ENCOUNTER — Other Ambulatory Visit: Payer: Self-pay | Admitting: Internal Medicine

## 2018-06-28 ENCOUNTER — Encounter: Payer: Self-pay | Admitting: Neurology

## 2018-06-28 ENCOUNTER — Other Ambulatory Visit: Payer: Self-pay

## 2018-06-28 ENCOUNTER — Ambulatory Visit: Payer: Medicare Other | Admitting: Neurology

## 2018-06-28 VITALS — BP 133/81 | HR 117 | Ht 69.0 in | Wt 225.5 lb

## 2018-06-28 DIAGNOSIS — F341 Dysthymic disorder: Secondary | ICD-10-CM | POA: Diagnosis not present

## 2018-06-28 DIAGNOSIS — G4733 Obstructive sleep apnea (adult) (pediatric): Secondary | ICD-10-CM

## 2018-06-28 DIAGNOSIS — R269 Unspecified abnormalities of gait and mobility: Secondary | ICD-10-CM

## 2018-06-28 DIAGNOSIS — E1142 Type 2 diabetes mellitus with diabetic polyneuropathy: Secondary | ICD-10-CM | POA: Diagnosis not present

## 2018-06-28 MED ORDER — PREGABALIN 200 MG PO CAPS: ORAL_CAPSULE | ORAL | 1 refills | Status: DC

## 2018-06-28 MED ORDER — DULOXETINE HCL 60 MG PO CPEP: 60.0000 mg | ORAL_CAPSULE | Freq: Two times a day (BID) | ORAL | 4 refills | Status: DC

## 2018-06-28 MED ORDER — TAPENTADOL HCL 75 MG PO TABS: 75.0000 mg | ORAL_TABLET | Freq: Three times a day (TID) | ORAL | 0 refills | Status: DC | PRN

## 2018-06-28 NOTE — Progress Notes (Signed)
[facility]  PATIENT: Frank [name] DOB: [birth year]  _________________________________   HISTORICAL  CHIEF COMPLAINT:  Chief Complaint  Patient presents with  . Follow-up    RM 13, alone. Last seen 03/08/18. Feels about the same as last visit. No new sx.     HISTORY OF PRESENT ILLNESS:  Frank [name] is a 62 year old man with a chronic peripheral neuropathy who had superimposed Guillain Barr syndrome in November 2015 and has had severe neuropathic pain  Update 06/28/18: Frank [name] is doing about the same.     The medications help t take the edge off of the neuropathic pain.    Nucynta helps the most.     The [place] shows good compliance.     The leg pain goes up to his knees.   There is numbness and the pain is severe pins/needles.  Also pain in his hands. Pain is worse with walking. Frank [name] feels his gait is stable and has no falls.    His stride is about the same.   Frank [name] has some ankle/toe weakness.      Frank [name] notes some weakness in his hands and also has stiffness in the fingers.  Frank [name] never had the CPAP titration.  Frank [name] dozes off in the afternoons.    Frank [name] canceled the CPAP titration earlier because of expense (has no MCR supplement)  Update 03/08/2018: Frank [name] feels Frank [name] is doing about the same.   Frank [name] feels Frank [name] gets some benefit from Lyrica, Duloxetine and Nucynta.   Frank [name] tolerates them well.   No s.e.   No constipation.   The [place] has been reviewed and Frank [name] is not getting medications from other sites.   No drug seeking behavior.   Frank [name] has the worse pn in his feet, up to the knees and in his hands.   There is numbness as well.   Frank [name] feels the symptoms are stable.    Activity like walking increases the pain and rest reduces pain some.   Frank [name] has DM.     Frank [name] has OSA but never had the CPAP titration.  Due to high cost, Frank [name] never had the titration.      Frank [name] gets drowsy in the evenings and frequently dozes off watching TV.      Mood is doing ok.    The combination of Cymbalta and Abilify has  helped hte depression and anxiety.      From 10/19/2017: Frank [name] feels the neuropathic dysesthetic pain is stable.   Lyrica and Nucynta and Duloxetine have helped.    Frank [name] tolerates them well.    Frank [name] has not escalated Nucynta use or shown drug seeking behavior.    The [place] was reviewed and is appropriate.     The foot numbness is the same.    His CTS hand pain is about the same and bothers him every day.    Surgery and injections had not helped much.     His OSA remains untreated by his choice.   Frank [name] does not think Frank [name] could use CPAP or an oral appliance.   Frank [name] dozes off some evenings but not everyday (Epworth is high at 14/24).   Frank [name] has lost 10 pounds since starting insulin.    His depression seems to be ok on Cymbalta and Abilify.    Anxiety is mild on these med's.       From 9/10/20198: Chronic neuropathic pain:  Frank [name] feels the leg pain is stable.    Pain is up to  the knees and in the hands.   Current pain is treated with Lyrica, Duloxetine and Nucynta.  His polyneuropathy pain started a few years before Frank [name] was diagnosed with NIDDM .   Frank [name] had GBS in November 2015 and dysesthesias worsened after that.  Frank [name] has numbness up to his knees and in both hands.    Frank [name] stumbles but no falls.     Frank [name] also notes weakness in his feet and hands.     Mood:   Frank [name] feels the depression is a little worse than last visit.    Frank [name] initially did better on combination of Cymbalta and Abilify.    Frank [name] tolerates both of these medications well.    Frank [name] also takes lorazepam for anxiety.    CTS:   His left hand pain is unchanged and still bothersome at times.     Frank [name] has a history of carpal tunnel syndrome.   Frank [name] had surgery in the past.   Frank [name] is not wearing the splint much since it did not help.    In the past, carpal tunnel injections have helped a little bit .    OSA:   Frank [name] was diagnosed with OSA around 2011 but never started CPAP.   Frank [name] does not want to use CPAP or an oral appliance.   Frank [name] notes fatigue and sleepiness.   Frank [name] falls asleep watching TV  and in the evenings.         EPWORTH SLEEPINESS SCALE  On a scale of 0 - 3 what is the chance of dozing:  Sitting and Reading:   1 Watching TV:    3 Sitting inactive in a public place: 1 Passenger in car for one hour: 3 Lying down to rest in the afternoon: 3 Sitting and talking to someone: 0 Sitting quietly after lunch:  3 In a car, stopped in traffic:  0  Total (out of 24):  14/24  (mild EDS)   GBS History:   The initial episode of GBS was treated with plasmapheresis. Frank [name] reports that the diagnosis took a couple weeks and Frank [name] had several emergency room visits. Initially Frank [name] was sent to a skilled nursing facility. Frank [name] was reporting a lot of back pain alongside the weakness. A lumbar puncture was eventually done showing high protein which led to the dye diagnosis of GBS.  Frank [name] had severe weakness at the peak and required intubation for respiratory support. At that time, Frank [name] was unable to move his legs and could barely move his arms. Frank [name] improved quite a bit after plasmapheresis and was discharged. However, a couple days after discharge Frank [name] began to feel weak again and return to the emergency room. Frank [name] was recently admitted for recurrent GBS. For that hospital stay Frank [name] did not require intubation but did have a facemask oxygen. Frank [name] also was treated with IVIG the second time around. Frank [name] improved and was discharged home. Frank [name] did outpatient physical therapy. Frank [name] has had a fairly good in movement of his strength and is able to walk independently. However, Frank [name] has had more dysesthetic pain.   With the exception of more pain, Frank [name] otherwise feels very close to his pre-GBS baseline.   Of note, Frank [name] had a flu shot a few weeks before the first episode of GBS.  MRI's from 06/2014 and 07/2014:    Cervical MRI shows spinal stenosis at C4-C5, C5-C6 and C6-C7. There is no spinal cord compression. Thoracic spine shows T8-T9 disc extrusion to the left that does not cause spinal  cord compression. There are mild degenerative changes in  the lumbar spine. Contrast was not used.   I also reviewed many of the notes and labs from his hospital stay. CSF Protein was greatly elevated at 118 and the CSF white blood cell count was 1.    REVIEW OF SYSTEMS: Constitutional: No fevers, chills, sweats, or change in appetite.   Has sleepiness Eyes: No visual changes, double vision, eye pain Ear, nose and throat: No hearing loss, ear pain, nasal congestion, sore throat Cardiovascular: No chest pain, palpitations Respiratory: No shortness of breath at rest or with exertion.   No wheezes.  Frank [name] has OSA GastrointestinaI: No nausea, vomiting, diarrhea, abdominal pain, fecal incontinence Genitourinary: No dysuria, urinary retention or frequency.  No nocturia. Musculoskeletal: No neck pain, back pain Integumentary: No rash, pruritus, skin lesions Neurological: as above Psychiatric: Anxiety and depression at this time.   ADD is stable. Endocrine: No palpitations, diaphoresis, change in appetite, change in weigh or increased thirst Hematologic/Lymphatic: No anemia, purpura, petechiae. Allergic/Immunologic: No itchy/runny eyes, nasal congestion, recent allergic reactions, rashes  ALLERGIES: Allergies  Allergen Reactions  . Codeine Itching    REACTION: Itching  . Influenza Vaccines     Due to hx of GBS  . Metformin And Related Other (See Comments)    GI upset, diarrhea  . Shellfish Allergy     HOME MEDICATIONS:  Current Outpatient Medications:  .  ARIPiprazole (ABILIFY) 10 MG tablet, Take 1 tablet (10 mg total) by mouth daily., Disp: 90 tablet, Rfl: 3 .  aspirin EC 81 MG tablet, Take 1 tablet (81 mg total) by mouth daily., Disp: , Rfl:  .  cyclobenzaprine (FLEXERIL) 5 MG tablet, TAKE 1 TABLET(5 MG) BY MOUTH AT BEDTIME, Disp: 30 tablet, Rfl: 11 .  Dulaglutide (TRULICITY) 1.5 JJ/0.0XF SOPN, 0.5 ml weekly subcutaneous, Disp: 6 mL, Rfl: 11 .  DULoxetine (CYMBALTA) 60 MG capsule, Take 1 capsule (60 mg total) by mouth 2 (two) times daily.,  Disp: 180 capsule, Rfl: 4 .  famotidine (PEPCID) 40 MG tablet, TAKE 1 TABLET(40 MG) BY MOUTH TWICE DAILY, Disp: 180 tablet, Rfl: 3 .  glipiZIDE (GLUCOTROL XL) 2.5 MG 24 hr tablet, Take 2.5 mg by mouth daily with breakfast., Disp: , Rfl:  .  glucose blood (ONE TOUCH ULTRA TEST) test strip, USE AS DIRECTED, Disp: 100 each, Rfl: 5 .  hydrochlorothiazide (MICROZIDE) 12.5 MG capsule, TAKE 1 CAPSULE(12.5 MG) BY MOUTH DAILY, Disp: 90 capsule, Rfl: 2 .  insulin degludec (TRESIBA FLEXTOUCH) 100 UNIT/ML SOPN FlexTouch Pen, Inject 0.5 mLs (50 Units total) into the skin daily. And pen needles 1/day, Disp: 10 pen, Rfl: 11 .  LORazepam (ATIVAN) 0.5 MG tablet, Take 1 tablet (0.5 mg total) by mouth 2 (two) times daily., Disp: 60 tablet, Rfl: 5 .  methylphenidate (CONCERTA) 54 MG PO CR tablet, Take 1 tablet (54 mg total) by mouth every morning., Disp: 30 tablet, Rfl: 0 .  ONETOUCH DELICA LANCETS FINE MISC, USE AS DIRECTED EVERY DAY, Disp: 100 each, Rfl: 5 .  pregabalin (LYRICA) 200 MG capsule, TAKE (1) CAPSULE THREE TIMES DAILY., Disp: 270 capsule, Rfl: 1 .  rosuvastatin (CRESTOR) 20 MG tablet, Take 1 tablet (20 mg total) by mouth daily., Disp: 90 tablet, Rfl: 3 .  tapentadol HCl (NUCYNTA) 75 MG tablet, Take 1 tablet (75 mg total) by mouth 3 (three) times daily as needed., Disp: 90 tablet, Rfl: 0 .  TRADJENTA 5 MG TABS tablet, TAKE 1 TABLET(5 MG) BY MOUTH DAILY, Disp: 90 tablet,  Rfl: 0 .  Continuous Blood Gluc Sensor (FREESTYLE LIBRE 14 DAY SENSOR) MISC, 1 Device by Does not apply route every 14 (fourteen) days. (Patient not taking: Reported on 06/28/2018), Disp: 6 each, Rfl: 3  PAST MEDICAL HISTORY: Past Medical History:  Diagnosis Date  . Abdominal pain, left lower quadrant 06/06/2010  . Abdominal pain, unspecified site 01/19/2009  . ADD 10/09/2008  . ALLERGIC RHINITIS 10/09/2008  . ANXIETY DEPRESSION 02/01/2008  . COLONIC POLYPS 02/01/2008  . DIABETES MELLITUS, TYPE II 05/20/2010  . DYSPNEA 03/12/2010  . ERECTILE  DYSFUNCTION 10/09/2008  . ERECTILE DYSFUNCTION, ORGANIC 05/20/2010  . FOOT PAIN, LEFT 05/20/2010  . GERD 02/01/2008  . Guillain Barr syndrome ([place])   . HEMORRHOIDS 02/01/2008  . HIATAL HERNIA 02/01/2008  . HYPERLIPIDEMIA 10/09/2008  . HYPERTENSION 10/09/2008  . MORTON'S NEUROMA 05/20/2010  . Other specified forms of hearing loss 06/27/2009  . PERIPHERAL EDEMA 05/20/2010  . PERIPHERAL NEUROPATHY 05/20/2010  . SLEEP APNEA, OBSTRUCTIVE 02/01/2008  . Stricture and stenosis of esophagus 02/02/2008  . Type II or unspecified type diabetes mellitus without mention of complication, uncontrolled 11/14/2010  . WRIST PAIN, LEFT 12/05/2009    PAST SURGICAL HISTORY: Past Surgical History:  Procedure Laterality Date  . CARPAL TUNNEL RELEASE    . ESOPHAGEAL DILATION  july 2009  . ESOPHAGOGASTRODUODENOSCOPY N/A 06/27/2014   Procedure: ESOPHAGOGASTRODUODENOSCOPY (EGD);  Surgeon: Lafayette Dragon, MD;  Location: [facility];  Service: Endoscopy;  Laterality: N/A;  . EYE SURGERY     catract surgery on both eyes  . ROTATOR CUFF REPAIR      FAMILY HISTORY: Family History  Problem Relation Age of Onset  . Diabetes Mother   . Heart disease Mother   . Hyperlipidemia Mother   . Depression Mother   . Diabetes Brother   . Colon cancer Neg Hx     SOCIAL HISTORY:  Social History   Socioeconomic History  . Marital status: Single    Spouse name: Not on file  . Number of children: Not on file  . Years of education: Not on file  . Highest education level: Not on file  Occupational History  . Occupation: Musician: [employer]  Social Needs  . Financial resource strain: Not on file  . Food insecurity:    Worry: Not on file    Inability: Not on file  . Transportation needs:    Medical: Not on file    Non-medical: Not on file  Tobacco Use  . Smoking status: Never Smoker  . Smokeless tobacco: Never Used  Substance and Sexual Activity  . Alcohol use: Yes    Comment: occasional  .  Drug use: Yes    Types: Methaqualone  . Sexual activity: Yes    Comment: before I got sick  Lifestyle  . Physical activity:    Days per week: Not on file    Minutes per session: Not on file  . Stress: Not on file  Relationships  . Social connections:    Talks on phone: Not on file    Gets together: Not on file    Attends religious service: Not on file    Active member of club or organization: Not on file    Attends meetings of clubs or organizations: Not on file    Relationship status: Not on file  . Intimate partner violence:    Fear of current or ex partner: Not on file    Emotionally abused: Not on file  Physically abused: Not on file    Forced sexual activity: Not on file  Other Topics Concern  . Not on file  Social History Narrative  . Not on file     PHYSICAL EXAM  Vitals:   06/28/18 1133  BP: 133/81  Pulse: (!) 117  Weight: 225 lb 8 oz (102.3 kg)  Height: 5\' 9"  (1.753 m)    Body mass index is 33.3 kg/m.   General: The patient is well-developed and well-nourished and in no acute distress  Skin: Extremities are without rash. Frank [name] has mild pedal edema, slightly worse on the left.    HEENT:   His pharynx is Mallampatti 4.  Neurologic Exam  Mental status: The patient is alert and oriented x 3 at the time of the examination. The patient has apparent normal recent and remote memory, with an apparently normal attention span and concentration ability.   Speech is normal.  Cranial nerves: Extraocular movements are full. Facial strength and sensation are normal..  Trapezius and sternocleidomastoid strength is normal. No dysarthria is noted.  The tongue is midline, and the patient has symmetric elevation of the soft palate. No obvious hearing deficits are noted.  Motor:  Muscle bulk is normal. Muscle tone is normal. Strength is 5/5 proximally in the arms and legs. Frank [name] has mild weakness in the intrinsiic hand muscles and feet/ankles with 4+ strength in interossei and  APB, EHL and 4++ in ankle extension   Sensory: Sensory testing is intact in wrists but reduced vib (normal touch) at fingertips.   There is mild reduced vibration at the knees, severe loss at ankles; absent at toes.   Touch is reduced below mid shin and very educed in the feet.  Coordination: Frank [name] has good finger-nose-finger but reduced heel-to-shin bilaterally.  Gait and station: Station is normal.   The gait has a reduced stride.   Tandem gait is poor and wide. Romberg is positive.   Reflexes: Deep tendon reflexes are symmetric and 1+ bilaterally at deltoid and trace at knees, absent at ankles.        DIAGNOSTIC DATA (LABS, IMAGING, TESTING) - I reviewed patient records, labs, notes, testing and imaging myself where available.  Lab Results  Component Value Date   WBC 9.5 11/16/2017   HGB 16.4 11/16/2017   HCT 46.9 11/16/2017   MCV 89.0 11/16/2017   PLT 197.0 11/16/2017      Component Value Date/Time   NA 139 04/14/2018 0857   K 4.7 04/14/2018 0857   CL 100 04/14/2018 0857   CO2 33 (H) 04/14/2018 0857   GLUCOSE 185 (H) 04/14/2018 0857   BUN 22 04/14/2018 0857   CREATININE 1.28 04/14/2018 0857   CALCIUM 9.4 04/14/2018 0857   PROT 7.2 04/14/2018 0857   PROT 7.0 08/30/2015 1157   ALBUMIN 4.1 04/14/2018 0857   AST 16 04/14/2018 0857   ALT 17 04/14/2018 0857   ALKPHOS 68 04/14/2018 0857   BILITOT 0.9 04/14/2018 0857   GFRNONAA >90 08/06/2014 0240   GFRAA >90 08/06/2014 0240   Lab Results  Component Value Date   CHOL 110 04/14/2018   HDL 38.90 (L) 04/14/2018   LDLCALC 23 11/16/2017   LDLDIRECT 50.0 04/14/2018   TRIG 212.0 (H) 04/14/2018   CHOLHDL 3 04/14/2018   Lab Results  Component Value Date   HGBA1C 8.6 (A) 05/31/2018   Lab Results  Component Value Date   VITAMINB12 316 08/30/2015   Lab Results  Component Value Date   TSH 3.65 11/16/2017  ASSESSMENT AND PLAN  Diabetic polyneuropathy associated with type 2 diabetes mellitus ([place])  Obstructive  sleep apnea  ANXIETY DEPRESSION  Gait disorder    1.   Continue Nucynta and other medications for his neuropathic pain.  The controlled substance database has been reviewed and Frank [name] has been compliant. 2.   Continue Cymbalta and Abilify for mood issues.. 3.  Frank [name] is willing to reconsider CPAP therapy and notes that his insurance will be changing in January and Frank [name] would like to wait until then.  Frank [name] will call us this occurs.   4.   Frank [name] will return to see me in 4 months or sooner if Frank [name] has new or worsening neurologic symptoms.      Young Mulvey A. [name], MD, PhD 23/34/3568, 6:16 PM Certified in Neurology, Clinical Neurophysiology, Sleep Medicine, Pain Medicine and Neuroimaging  [facility] [address], [place], [place] [address] [phone]

## 2018-07-22 ENCOUNTER — Other Ambulatory Visit: Payer: Self-pay | Admitting: Internal Medicine

## 2018-08-30 ENCOUNTER — Encounter: Payer: Self-pay | Admitting: Endocrinology

## 2018-08-30 ENCOUNTER — Ambulatory Visit: Payer: Medicare Other | Admitting: Endocrinology

## 2018-08-30 VITALS — BP 122/80 | HR 116 | Ht 69.0 in | Wt 226.0 lb

## 2018-08-30 DIAGNOSIS — E1142 Type 2 diabetes mellitus with diabetic polyneuropathy: Secondary | ICD-10-CM

## 2018-08-30 DIAGNOSIS — IMO0002 Reserved for concepts with insufficient information to code with codable children: Secondary | ICD-10-CM

## 2018-08-30 DIAGNOSIS — E1165 Type 2 diabetes mellitus with hyperglycemia: Secondary | ICD-10-CM | POA: Diagnosis not present

## 2018-08-30 LAB — POCT GLYCOSYLATED HEMOGLOBIN (HGB A1C): Hemoglobin A1C: 8.8 % — AB (ref 4.0–5.6)

## 2018-08-30 MED ORDER — INSULIN DEGLUDEC 100 UNIT/ML ~~LOC~~ SOPN: 60.0000 [IU] | PEN_INJECTOR | Freq: Every day | SUBCUTANEOUS | 11 refills | Status: DC

## 2018-08-30 NOTE — Patient Instructions (Addendum)
Please increase the tresiba to 60 units daily, and: continue the same other diabetes medications. check your blood sugar once a day.  vary the time of day when you check, between before the 3 meals, and at bedtime.  also check if you have symptoms of your blood sugar being too high or too low.  please keep a record of the readings and bring it to your next appointment here (or you can bring the meter itself).  You can write it on any piece of paper.  please call us sooner if your blood sugar goes below 70, or if you have a lot of readings over 200.  Please come back for a follow-up appointment in 2 months.

## 2018-08-30 NOTE — Progress Notes (Signed)
Subjective:    Patient ID: Frank [name], male    DOB: May 13, 1956, 63 y.o.   MRN: [id number]  HPI Pt returns for f/u of diabetes mellitus: DM type: Insulin-requiring type 2 Dx'ed: 1194 Complications: polyneuropathy and renal insuff.  Therapy: insulin since 1740, trulicity, and 2 oral meds DKA: never Severe hypoglycemia: never.   Pancreatitis: never Pancreatic imaging: normal on 2006 CT.   Other: he has never been on insulin; he did not tolerate metformin-XR (GI sxs); he takes QD insulin, due to noncompliance; pt injects his own insulin.   Interval history: He brings a record of his cbg's which I have reviewed today.  It varies from 111-355.  It is in general lower as the day goes on.  he says he never misses the insulin. pt states he feels well in general.   Past Medical History:  Diagnosis Date  . Abdominal pain, left lower quadrant 06/06/2010  . Abdominal pain, unspecified site 01/19/2009  . ADD 10/09/2008  . ALLERGIC RHINITIS 10/09/2008  . ANXIETY DEPRESSION 02/01/2008  . COLONIC POLYPS 02/01/2008  . DIABETES MELLITUS, TYPE II 05/20/2010  . DYSPNEA 03/12/2010  . ERECTILE DYSFUNCTION 10/09/2008  . ERECTILE DYSFUNCTION, ORGANIC 05/20/2010  . FOOT PAIN, LEFT 05/20/2010  . GERD 02/01/2008  . Guillain Barr syndrome ([place])   . HEMORRHOIDS 02/01/2008  . HIATAL HERNIA 02/01/2008  . HYPERLIPIDEMIA 10/09/2008  . HYPERTENSION 10/09/2008  . MORTON'S NEUROMA 05/20/2010  . Other specified forms of hearing loss 06/27/2009  . PERIPHERAL EDEMA 05/20/2010  . PERIPHERAL NEUROPATHY 05/20/2010  . SLEEP APNEA, OBSTRUCTIVE 02/01/2008  . Stricture and stenosis of esophagus 02/02/2008  . Type II or unspecified type diabetes mellitus without mention of complication, uncontrolled 11/14/2010  . WRIST PAIN, LEFT 12/05/2009    Past Surgical History:  Procedure Laterality Date  . CARPAL TUNNEL RELEASE    . ESOPHAGEAL DILATION  july 2009  . ESOPHAGOGASTRODUODENOSCOPY N/A 06/27/2014   Procedure:  ESOPHAGOGASTRODUODENOSCOPY (EGD);  Surgeon: Lafayette Dragon, MD;  Location: [facility];  Service: Endoscopy;  Laterality: N/A;  . EYE SURGERY     catract surgery on both eyes  . ROTATOR CUFF REPAIR      Social History   Socioeconomic History  . Marital status: Single    Spouse name: Not on file  . Number of children: Not on file  . Years of education: Not on file  . Highest education level: Not on file  Occupational History  . Occupation: Musician: [employer]  Social Needs  . Financial resource strain: Not on file  . Food insecurity:    Worry: Not on file    Inability: Not on file  . Transportation needs:    Medical: Not on file    Non-medical: Not on file  Tobacco Use  . Smoking status: Never Smoker  . Smokeless tobacco: Never Used  Substance and Sexual Activity  . Alcohol use: Yes    Comment: occasional  . Drug use: Yes    Types: Methaqualone  . Sexual activity: Yes    Comment: before I got sick  Lifestyle  . Physical activity:    Days per week: Not on file    Minutes per session: Not on file  . Stress: Not on file  Relationships  . Social connections:    Talks on phone: Not on file    Gets together: Not on file    Attends religious service: Not on file    Active member of club  or organization: Not on file    Attends meetings of clubs or organizations: Not on file    Relationship status: Not on file  . Intimate partner violence:    Fear of current or ex partner: Not on file    Emotionally abused: Not on file    Physically abused: Not on file    Forced sexual activity: Not on file  Other Topics Concern  . Not on file  Social History Narrative  . Not on file    Current Outpatient Medications on File Prior to Visit  Medication Sig Dispense Refill  . ARIPiprazole (ABILIFY) 10 MG tablet Take 1 tablet (10 mg total) by mouth daily. 90 tablet 3  . aspirin EC 81 MG tablet Take 1 tablet (81 mg total) by mouth daily.    . cyclobenzaprine  (FLEXERIL) 5 MG tablet TAKE 1 TABLET(5 MG) BY MOUTH AT BEDTIME 30 tablet 11  . DULoxetine (CYMBALTA) 60 MG capsule Take 1 capsule (60 mg total) by mouth 2 (two) times daily. 180 capsule 4  . famotidine (PEPCID) 40 MG tablet TAKE 1 TABLET(40 MG) BY MOUTH TWICE DAILY 180 tablet 3  . glucose blood (ONE TOUCH ULTRA TEST) test strip USE AS DIRECTED 100 each 5  . hydrochlorothiazide (MICROZIDE) 12.5 MG capsule TAKE 1 CAPSULE(12.5 MG) BY MOUTH DAILY 90 capsule 2  . LORazepam (ATIVAN) 0.5 MG tablet Take 1 tablet (0.5 mg total) by mouth 2 (two) times daily. 60 tablet 5  . methylphenidate (CONCERTA) 54 MG PO CR tablet Take 1 tablet (54 mg total) by mouth every morning. 30 tablet 0  . ONETOUCH DELICA LANCETS FINE MISC USE AS DIRECTED EVERY DAY 100 each 5  . pregabalin (LYRICA) 200 MG capsule TAKE (1) CAPSULE THREE TIMES DAILY. 270 capsule 1  . rosuvastatin (CRESTOR) 20 MG tablet Take 1 tablet (20 mg total) by mouth daily. 90 tablet 3  . tapentadol HCl (NUCYNTA) 75 MG tablet Take 1 tablet (75 mg total) by mouth 3 (three) times daily as needed. 90 tablet 0  . TRADJENTA 5 MG TABS tablet TAKE 1 TABLET(5 MG) BY MOUTH DAILY 90 tablet 0  . TRULICITY 1.5 LZ/7.6BH SOPN USE 0.5 MLS WEEKLY 6 mL 11   No current facility-administered medications on file prior to visit.     Allergies  Allergen Reactions  . Codeine Itching    REACTION: Itching  . Influenza Vaccines     Due to hx of GBS  . Metformin And Related Other (See Comments)    GI upset, diarrhea  . Shellfish Allergy     Family History  Problem Relation Age of Onset  . Diabetes Mother   . Heart disease Mother   . Hyperlipidemia Mother   . Depression Mother   . Diabetes Brother   . Colon cancer Neg Hx     BP 122/80 (BP Location: Left Arm, Patient Position: Sitting, Cuff Size: Normal)   Pulse (!) 116   Ht 5\' 9"  (1.753 m)   Wt 226 lb (102.5 kg)   SpO2 95%   BMI 33.37 kg/m    Review of Systems He denies hypoglycemia.      Objective:    Physical Exam VITAL SIGNS:  See vs page GENERAL: no distress Pulses: foot pulses are intact bilaterally.   MSK: no deformity of the feet or ankles.  CV: no edema of the legs or ankles Skin:  no ulcer on the feet or ankles, but the skin is dry.  normal color and temp on  the feet and ankles Neuro: sensation is intact to touch on the feet and ankles.   Ext: There is bilateral onychomycosis of the toenails.     Lab Results  Component Value Date   HGBA1C 8.8 (A) 08/30/2018   Lab Results  Component Value Date   CREATININE 1.28 04/14/2018   BUN 22 04/14/2018   NA 139 04/14/2018   K 4.7 04/14/2018   CL 100 04/14/2018   CO2 33 (H) 04/14/2018       Assessment & Plan:  Insulin-requiring type 2 DM: worse Renal insuff: he is at risk for hypoglycemia, so we'll increase insulin cautiously.  Patient Instructions  Please increase the tresiba to 60 units daily, and: continue the same other diabetes medications. check your blood sugar once a day.  vary the time of day when you check, between before the 3 meals, and at bedtime.  also check if you have symptoms of your blood sugar being too high or too low.  please keep a record of the readings and bring it to your next appointment here (or you can bring the meter itself).  You can write it on any piece of paper.  please call us sooner if your blood sugar goes below 70, or if you have a lot of readings over 200.  Please come back for a follow-up appointment in 2 months.

## 2018-09-14 ENCOUNTER — Other Ambulatory Visit: Payer: Self-pay | Admitting: Internal Medicine

## 2018-09-28 ENCOUNTER — Other Ambulatory Visit: Payer: Self-pay | Admitting: Internal Medicine

## 2018-09-28 ENCOUNTER — Other Ambulatory Visit: Payer: Self-pay | Admitting: Neurology

## 2018-09-28 MED ORDER — TAPENTADOL HCL 75 MG PO TABS: 75.0000 mg | ORAL_TABLET | Freq: Three times a day (TID) | ORAL | 0 refills | Status: DC | PRN

## 2018-09-28 MED ORDER — METHYLPHENIDATE HCL ER (OSM) 54 MG PO TBCR: 54.0000 mg | EXTENDED_RELEASE_TABLET | ORAL | 0 refills | Status: DC

## 2018-09-28 NOTE — Telephone Encounter (Signed)
Done erx 

## 2018-09-28 NOTE — Addendum Note (Signed)
Addended by: Hope Pigeon on: 09/28/2018 01:31 PM   Modules accepted: Orders

## 2018-09-28 NOTE — Telephone Encounter (Signed)
Pt is needing a refill on his tapentadol HCl (NUCYNTA) 75 MG tablet sent to [facility] on [address]

## 2018-09-28 NOTE — Telephone Encounter (Signed)
Copied from [place] [phone]. Topic: Quick Communication - Rx Refill/Question >> Sep 28, 2018  1:11 PM Sheran Luz wrote: Medication: methylphenidate (CONCERTA) 54 MG PO CR tablet  Patient is requesting a refill of this medication.   Preferred Pharmacy (with phone number or street name):[id number] - [place], [place] - [address]  [phone] (Phone) [phone] (Fax)

## 2018-10-09 ENCOUNTER — Encounter: Payer: Self-pay | Admitting: Gastroenterology

## 2018-10-16 ENCOUNTER — Other Ambulatory Visit: Payer: Self-pay | Admitting: Neurology

## 2018-10-16 ENCOUNTER — Other Ambulatory Visit: Payer: Self-pay | Admitting: Internal Medicine

## 2018-10-18 NOTE — Telephone Encounter (Signed)
Done erx 

## 2018-10-19 ENCOUNTER — Other Ambulatory Visit: Payer: Self-pay | Admitting: *Deleted

## 2018-10-19 MED ORDER — ARIPIPRAZOLE 10 MG PO TABS: 10.0000 mg | ORAL_TABLET | Freq: Every day | ORAL | 3 refills | Status: DC

## 2018-11-01 ENCOUNTER — Ambulatory Visit: Payer: Medicare Other | Admitting: Endocrinology

## 2018-11-09 ENCOUNTER — Other Ambulatory Visit: Payer: Self-pay | Admitting: Internal Medicine

## 2018-11-17 ENCOUNTER — Telehealth: Payer: Self-pay | Admitting: Neurology

## 2018-11-17 NOTE — Telephone Encounter (Signed)
[name] who is on patients DPR  Called back and informed me they will do the virtual visit. I informed him that I will send him an email and it will give him in detail on what to do. I informed him that he will download [employer] webex meeting on the device he will use and that its free and safe to use and he doesn't need to create and account just needs to be download on his device. I asked him to be ready 30 mins prior to his appt. I also informed him if he has not received my email within the next couple of mins to give me a call back in the next 30 mins to let me know. I also informed him that we will be filing his insurance the same as if he was in the office. [name]

## 2018-11-17 NOTE — Telephone Encounter (Signed)
Called and spoke with Frank [name]. Updated medication list/allergies/pharmacy list.   Frank [name] concerned because Frank [name] is sleeping all the time. Worried he is overmedicated. He does very little. Stays up all night and watches TV. If Frank [name] asks him to do things he will but otherwise he won't do anything. He does not want him to know he told us this. Wants Dr. [name] to prompt this conversation if possible.   He was having trouble downloading [employer] webex on ipad and computer. I was able to walk him through to get him to download on his iphone. Nothing further needed.

## 2018-11-17 NOTE — Telephone Encounter (Signed)
Pt gave consent for a virtual visit  Email- [email or web address]

## 2018-11-17 NOTE — Telephone Encounter (Signed)
11/17/18 LVM to schedule patient.

## 2018-11-22 ENCOUNTER — Ambulatory Visit (INDEPENDENT_AMBULATORY_CARE_PROVIDER_SITE_OTHER): Payer: Medicare Other | Admitting: Neurology

## 2018-11-22 ENCOUNTER — Other Ambulatory Visit: Payer: Self-pay

## 2018-11-22 ENCOUNTER — Encounter: Payer: Self-pay | Admitting: Neurology

## 2018-11-22 ENCOUNTER — Telehealth: Payer: Self-pay | Admitting: Neurology

## 2018-11-22 DIAGNOSIS — E1142 Type 2 diabetes mellitus with diabetic polyneuropathy: Secondary | ICD-10-CM

## 2018-11-22 DIAGNOSIS — G4719 Other hypersomnia: Secondary | ICD-10-CM

## 2018-11-22 DIAGNOSIS — G5603 Carpal tunnel syndrome, bilateral upper limbs: Secondary | ICD-10-CM

## 2018-11-22 DIAGNOSIS — G610 Guillain-Barre syndrome: Secondary | ICD-10-CM

## 2018-11-22 DIAGNOSIS — G894 Chronic pain syndrome: Secondary | ICD-10-CM

## 2018-11-22 DIAGNOSIS — G4733 Obstructive sleep apnea (adult) (pediatric): Secondary | ICD-10-CM

## 2018-11-22 DIAGNOSIS — R208 Other disturbances of skin sensation: Secondary | ICD-10-CM

## 2018-11-22 MED ORDER — TAPENTADOL HCL 75 MG PO TABS: 75.0000 mg | ORAL_TABLET | Freq: Three times a day (TID) | ORAL | 0 refills | Status: DC | PRN

## 2018-11-22 MED ORDER — PREGABALIN 200 MG PO CAPS: ORAL_CAPSULE | ORAL | 1 refills | Status: DC

## 2018-11-22 NOTE — Telephone Encounter (Signed)
11/22/18 - LVM to schedule 4 month f/u with Dr. [name]

## 2018-11-22 NOTE — Progress Notes (Signed)
[facility]  PATIENT: Frank [name] DOB: 1956/04/03  _________________________________   HISTORICAL  CHIEF COMPLAINT:  No chief complaint on file.   HISTORY OF PRESENT ILLNESS:  Frank [name] is a 63 year old man with a chronic peripheral neuropathy who had superimposed Guillain Barr syndrome in November 2015 and has had severe neuropathic pain  Update 11/22/2018: Virtual Visit via Video Note I connected with Frank [name] on 11/22/18 at 11:30 AM EDT by a video enabled telemedicine application and verified that I am speaking with the correct person.  I discussed the limitations of evaluation and management by telemedicine and the availability of in person appointments. The patient expressed understanding and agreed to proceed.  History of Present Illness: He feels he is doing about the same.  The worse pain is in his hands.   He has numbness up to his knees.  With the medication, he feels his pain is tolerable.  He has not shown any drug escalation or inappropriate behavior.  [place] controlled substance database was reviewed and he has been compliant.  He feels his gait is worse.   Balance is reduced.   He only walks around his house.     He is very sleepy during the day.  This is felt to be worse by his husband.  He takes Lyrica 200 mg po tid.   He takes ativan twice a day. He takes Nucynta 75 mg po tid and feels it helps him a lot.     He takes flexeril 5 mg at bedtime only.     He has OSA (saw Dr. Annamaria Boots in 2008) but never started CPAP.  We were going to repeat the PSG but the expense was going to be too high for him and it has not been done.  EPWORTH SLEEPINESS SCALE  On a scale of 0 - 3 what is the chance of dozing:  Sitting and Reading:  2 Watching TV:   2 Sitting inactive in a public place: 2 Passenger in car for one hour: 3 Lying down to rest in the afternoon: 3 Sitting and talking to someone: 0 Sitting quietly after lunch:  3 In  a car, stopped in traffic:  0  Does not drive  Total (out of 24):   15/24 moderate ESS    Observations/Objective: He is a well-developed well-nourished man in no acute distress.    The head is normocephalic and atraumatic.  Sclera are anicteric.  Visible skin appears normal.  Neck appears to have normal range of motion.  The head is normocephalic and atraumatic.  He is alert and fully oriented with fluent speech and good attention, knowledge and memory.  Extraocular muscles were intact.  Facial strength was normal.  Trapezius strength was normal.  He appeared to have normal strength in the arms and hands.  Rapid alternating movements and finger-nose-finger were performed well.  Assessment and Plan: Diabetic polyneuropathy associated with type 2 diabetes mellitus (HCC)  Bilateral carpal tunnel syndrome  Recurrent GBS (Guillain-Barre syndrome) (HCC)  Obstructive sleep apnea  Chronic pain syndrome  Dysesthesia  1.   Since he has had more daytime sleepiness the last year, I will reduce the pregabalin to twice a day instead of 3 times a day.  We will continue the Nucynta at the regular dose. 2.    We discussed that he had sleep apnea in the past and that part of his daytime sleepiness is likely due to that diagnosis.  He did not do a  PSG when ordered a couple years ago.  I will reorder as a HST to see if that will be covered. 3.    He will return to see me in 4 months or sooner if there are new or worsening neurologic symptoms.  I discussed the assessment and treatment plan with the patient. The patient was provided an opportunity to ask questions and all were answered. The patient agreed with the plan and demonstrated an understanding of the instructions.  The patient was advised to call back or seek an in-person evaluation if the symptoms worsen or if the condition fails to improve as anticipated.  I provided 25 minutes of non-face-to-face time during this encounter.    Update  06/28/18: He is doing about the same.     The medications help t take the edge off of the neuropathic pain.    Nucynta helps the most.     The [place] shows good compliance.     The leg pain goes up to his knees.   There is numbness and the pain is severe pins/needles.  Also pain in his hands. Pain is worse with walking. He feels his gait is stable and has no falls.    His stride is about the same.   He has some ankle/toe weakness.      He notes some weakness in his hands and also has stiffness in the fingers.  He never had the CPAP titration.  He dozes off in the afternoons.    He canceled the CPAP titration earlier because of expense (has no MCR supplement)  Update 03/08/2018: He feels he is doing about the same.   He feels he gets some benefit from Lyrica, Duloxetine and Nucynta.   He tolerates them well.   No s.e.   No constipation.   The [place] has been reviewed and he is not getting medications from other sites.   No drug seeking behavior.   He has the worse pn in his feet, up to the knees and in his hands.   There is numbness as well.   He feels the symptoms are stable.    Activity like walking increases the pain and rest reduces pain some.   He has DM.     He has OSA but never had the CPAP titration.  Due to high cost, he never had the titration.      He gets drowsy in the evenings and frequently dozes off watching TV.      Mood is doing ok.    The combination of Cymbalta and Abilify has helped hte depression and anxiety.      From 10/19/2017: He feels the neuropathic dysesthetic pain is stable.   Lyrica and Nucynta and Duloxetine have helped.    He tolerates them well.    He has not escalated Nucynta use or shown drug seeking behavior.    The [place] was reviewed and is appropriate.     The foot numbness is the same.    His CTS hand pain is about the same and bothers him every day.    Surgery and injections had not helped much.     His OSA remains untreated by his choice.   He does not  think he could use CPAP or an oral appliance.   He dozes off some evenings but not everyday ([place] is high at 14/24).   He has lost 10 pounds since starting insulin.    His depression seems to be ok on  Cymbalta and Abilify.    Anxiety is mild on these med's.       From 9/10/20198: Chronic neuropathic pain:  He feels the leg pain is stable.    Pain is up to the knees and in the hands.   Current pain is treated with Lyrica, Duloxetine and Nucynta.  His polyneuropathy pain started a few years before he was diagnosed with NIDDM .   He had GBS in November 2015 and dysesthesias worsened after that.  He has numbness up to his knees and in both hands.    He stumbles but no falls.     He also notes weakness in his feet and hands.     Mood:   He feels the depression is a little worse than last visit.    He initially did better on combination of Cymbalta and Abilify.    He tolerates both of these medications well.    He also takes lorazepam for anxiety.    CTS:   His left hand pain is unchanged and still bothersome at times.     He has a history of carpal tunnel syndrome.   He had surgery in the past.   He is not wearing the splint much since it did not help.    In the past, carpal tunnel injections have helped a little bit .    OSA:   He was diagnosed with OSA around 2011 but never started CPAP.   He does not want to use CPAP or an oral appliance.   He notes fatigue and sleepiness.   He falls asleep watching TV and in the evenings.         EPWORTH SLEEPINESS SCALE  On a scale of 0 - 3 what is the chance of dozing:  Sitting and Reading:   1 Watching TV:    3 Sitting inactive in a public place: 1 Passenger in car for one hour: 3 Lying down to rest in the afternoon: 3 Sitting and talking to someone: 0 Sitting quietly after lunch:  3 In a car, stopped in traffic:  0  Total (out of 24):  14/24  (mild EDS)   GBS History:   The initial episode of GBS was treated with plasmapheresis. He reports that the  diagnosis took a couple weeks and he had several emergency room visits. Initially he was sent to a skilled nursing facility. He was reporting a lot of back pain alongside the weakness. A lumbar puncture was eventually done showing high protein which led to the dye diagnosis of GBS.  He had severe weakness at the peak and required intubation for respiratory support. At that time, he was unable to move his legs and could barely move his arms. He improved quite a bit after plasmapheresis and was discharged. However, a couple days after discharge he began to feel weak again and return to the emergency room. He was recently admitted for recurrent GBS. For that hospital stay he did not require intubation but did have a facemask oxygen. He also was treated with IVIG the second time around. He improved and was discharged home. He did outpatient physical therapy. He has had a fairly good in movement of his strength and is able to walk independently. However, he has had more dysesthetic pain.   With the exception of more pain, he otherwise feels very close to his pre-GBS baseline.   Of note, he had a flu shot a few weeks before the first episode of GBS.  MRI's from 06/2014 and 07/2014:    Cervical MRI shows spinal stenosis at C4-C5, C5-C6 and C6-C7. There is no spinal cord compression. Thoracic spine shows T8-T9 disc extrusion to the left that does not cause spinal cord compression. There are mild degenerative changes in the lumbar spine. Contrast was not used.   I also reviewed many of the notes and labs from his hospital stay. CSF Protein was greatly elevated at 118 and the CSF white blood cell count was 1.    REVIEW OF SYSTEMS: Constitutional: No fevers, chills, sweats, or change in appetite.   Has sleepiness Eyes: No visual changes, double vision, eye pain Ear, nose and throat: No hearing loss, ear pain, nasal congestion, sore throat Cardiovascular: No chest pain, palpitations Respiratory: No shortness of  breath at rest or with exertion.   No wheezes.  He has OSA GastrointestinaI: No nausea, vomiting, diarrhea, abdominal pain, fecal incontinence Genitourinary: No dysuria, urinary retention or frequency.  No nocturia. Musculoskeletal: No neck pain, back pain Integumentary: No rash, pruritus, skin lesions Neurological: as above Psychiatric: Anxiety and depression at this time.   ADD is stable. Endocrine: No palpitations, diaphoresis, change in appetite, change in weigh or increased thirst Hematologic/Lymphatic: No anemia, purpura, petechiae. Allergic/Immunologic: No itchy/runny eyes, nasal congestion, recent allergic reactions, rashes  ALLERGIES: Allergies  Allergen Reactions   Codeine Itching    REACTION: Itching   Influenza Vaccines     Due to hx of GBS   Metformin And Related Other (See Comments)    GI upset, diarrhea   Shellfish Allergy     HOME MEDICATIONS:  Current Outpatient Medications:    ARIPiprazole (ABILIFY) 10 MG tablet, Take 1 tablet (10 mg total) by mouth daily., Disp: 90 tablet, Rfl: 3   aspirin EC 81 MG tablet, Take 1 tablet (81 mg total) by mouth daily., Disp: , Rfl:    cyclobenzaprine (FLEXERIL) 5 MG tablet, TAKE 1 TABLET(5 MG) BY MOUTH AT BEDTIME, Disp: 30 tablet, Rfl: 11   DULoxetine (CYMBALTA) 60 MG capsule, Take 1 capsule (60 mg total) by mouth 2 (two) times daily., Disp: 180 capsule, Rfl: 4   famotidine (PEPCID) 40 MG tablet, TAKE 1 TABLET(40 MG) BY MOUTH TWICE DAILY, Disp: 180 tablet, Rfl: 3   glucose blood (ONE TOUCH ULTRA TEST) test strip, USE AS DIRECTED, Disp: 100 each, Rfl: 5   hydrochlorothiazide (MICROZIDE) 12.5 MG capsule, TAKE 1 CAPSULE(12.5 MG) BY MOUTH DAILY, Disp: 90 capsule, Rfl: 1   insulin degludec (TRESIBA FLEXTOUCH) 100 UNIT/ML SOPN FlexTouch Pen, Inject 0.6 mLs (60 Units total) into the skin daily. And pen needles 1/day, Disp: 10 pen, Rfl: 11   LORazepam (ATIVAN) 0.5 MG tablet, TAKE 1 TABLET(0.5 MG) BY MOUTH TWICE DAILY, Disp:  60 tablet, Rfl: 5   methylphenidate (CONCERTA) 54 MG PO CR tablet, Take 1 tablet (54 mg total) by mouth every morning., Disp: 30 tablet, Rfl: 0   ONETOUCH DELICA LANCETS FINE MISC, USE AS DIRECTED EVERY DAY, Disp: 100 each, Rfl: 5   pregabalin (LYRICA) 200 MG capsule, TAKE (1) CAPSULE THREE TIMES DAILY., Disp: 270 capsule, Rfl: 1   rosuvastatin (CRESTOR) 20 MG tablet, Take 1 tablet (20 mg total) by mouth daily., Disp: 90 tablet, Rfl: 3   tapentadol HCl (NUCYNTA) 75 MG tablet, Take 1 tablet (75 mg total) by mouth 3 (three) times daily as needed., Disp: 90 tablet, Rfl: 0   TRADJENTA 5 MG TABS tablet, TAKE 1 TABLET(5 MG) BY MOUTH DAILY, Disp: 90 tablet, Rfl: 1  TRULICITY 1.5 SH/7.65YO SOPN, USE 0.5 MLS WEEKLY, Disp: 6 mL, Rfl: 11  PAST MEDICAL HISTORY: Past Medical History:  Diagnosis Date   Abdominal pain, left lower quadrant 06/06/2010   Abdominal pain, unspecified site 01/19/2009   ADD 10/09/2008   ALLERGIC RHINITIS 10/09/2008   ANXIETY DEPRESSION 02/01/2008   COLONIC POLYPS 02/01/2008   DIABETES MELLITUS, TYPE II 05/20/2010   DYSPNEA 03/12/2010   ERECTILE DYSFUNCTION 10/09/2008   ERECTILE DYSFUNCTION, ORGANIC 05/20/2010   FOOT PAIN, LEFT 05/20/2010   GERD 02/01/2008   Guillain Barr syndrome ([place])    HEMORRHOIDS 02/01/2008   HIATAL HERNIA 02/01/2008   HYPERLIPIDEMIA 10/09/2008   HYPERTENSION 10/09/2008   MORTON'S NEUROMA 05/20/2010   Other specified forms of hearing loss 06/27/2009   PERIPHERAL EDEMA 05/20/2010   PERIPHERAL NEUROPATHY 05/20/2010   SLEEP APNEA, OBSTRUCTIVE 02/01/2008   Stricture and stenosis of esophagus 02/02/2008   Type II or unspecified type diabetes mellitus without mention of complication, uncontrolled 11/14/2010   WRIST PAIN, LEFT 12/05/2009    PAST SURGICAL HISTORY: Past Surgical History:  Procedure Laterality Date   CARPAL TUNNEL RELEASE     ESOPHAGEAL DILATION  july 2009   ESOPHAGOGASTRODUODENOSCOPY N/A 06/27/2014   Procedure:  ESOPHAGOGASTRODUODENOSCOPY (EGD);  Surgeon: Lafayette Dragon, MD;  Location: [facility];  Service: Endoscopy;  Laterality: N/A;   EYE SURGERY     catract surgery on both eyes   ROTATOR CUFF REPAIR      FAMILY HISTORY: Family History  Problem Relation Age of Onset   Diabetes Mother    Heart disease Mother    Hyperlipidemia Mother    Depression Mother    Diabetes Brother    Colon cancer Neg Hx     SOCIAL HISTORY:  Social History   Socioeconomic History   Marital status: Single    Spouse name: Not on file   Number of children: Not on file   Years of education: Not on file   Highest education level: Not on file  Occupational History   Occupation: Housekeeper [employer]    Employer: [employer]  Social Needs   Financial resource strain: Not on file   Food insecurity:    Worry: Not on file    Inability: Not on file   Transportation needs:    Medical: Not on file    Non-medical: Not on file  Tobacco Use   Smoking status: Never Smoker   Smokeless tobacco: Never Used  Substance and Sexual Activity   Alcohol use: Yes    Comment: occasional   Drug use: Yes    Types: Methaqualone   Sexual activity: Yes    Comment: before I got sick  Lifestyle   Physical activity:    Days per week: Not on file    Minutes per session: Not on file   Stress: Not on file  Relationships   Social connections:    Talks on phone: Not on file    Gets together: Not on file    Attends religious service: Not on file    Active member of club or organization: Not on file    Attends meetings of clubs or organizations: Not on file    Relationship status: Not on file   Intimate partner violence:    Fear of current or ex partner: Not on file    Emotionally abused: Not on file    Physically abused: Not on file    Forced sexual activity: Not on file  Other Topics Concern   Not on file  Social History Narrative   Not on file     PHYSICAL EXAM  There were no vitals filed  for this visit.  There is no height or weight on file to calculate BMI.   General: The patient is well-developed and well-nourished and in no acute distress  Skin: Extremities are without rash. He has mild pedal edema, slightly worse on the left.    HEENT:   His pharynx is [place]atti 4.  Neurologic Exam  Mental status: The patient is alert and oriented x 3 at the time of the examination. The patient has apparent normal recent and remote memory, with an apparently normal attention span and concentration ability.   Speech is normal.  Cranial nerves: Extraocular movements are full. Facial strength and sensation are normal..  Trapezius and sternocleidomastoid strength is normal. No dysarthria is noted.  The tongue is midline, and the patient has symmetric elevation of the soft palate. No obvious hearing deficits are noted.  Motor:  Muscle bulk is normal. Muscle tone is normal. Strength is 5/5 proximally in the arms and legs. He has mild weakness in the intrinsiic hand muscles and feet/ankles with 4+ strength in interossei and APB, EHL and 4++ in ankle extension   Sensory: Sensory testing is intact in wrists but reduced vib (normal touch) at fingertips.   There is mild reduced vibration at the knees, severe loss at ankles; absent at toes.   Touch is reduced below mid shin and very educed in the feet.  Coordination: He has good finger-nose-finger but reduced heel-to-shin bilaterally.  Gait and station: Station is normal.   The gait has a reduced stride.   Tandem gait is poor and wide. Romberg is positive.   Reflexes: Deep tendon reflexes are symmetric and 1+ bilaterally at deltoid and trace at knees, absent at ankles.        DIAGNOSTIC DATA (LABS, IMAGING, TESTING) - I reviewed patient records, labs, notes, testing and imaging myself where available.  Lab Results  Component Value Date   WBC 9.5 11/16/2017   HGB 16.4 11/16/2017   HCT 46.9 11/16/2017   MCV 89.0 11/16/2017   PLT 197.0  11/16/2017      Component Value Date/Time   NA 139 04/14/2018 0857   K 4.7 04/14/2018 0857   CL 100 04/14/2018 0857   CO2 33 (H) 04/14/2018 0857   GLUCOSE 185 (H) 04/14/2018 0857   BUN 22 04/14/2018 0857   CREATININE 1.28 04/14/2018 0857   CALCIUM 9.4 04/14/2018 0857   PROT 7.2 04/14/2018 0857   PROT 7.0 08/30/2015 1157   ALBUMIN 4.1 04/14/2018 0857   AST 16 04/14/2018 0857   ALT 17 04/14/2018 0857   ALKPHOS 68 04/14/2018 0857   BILITOT 0.9 04/14/2018 0857   GFRNONAA >90 08/06/2014 0240   GFRAA >90 08/06/2014 0240   Lab Results  Component Value Date   CHOL 110 04/14/2018   HDL 38.90 (L) 04/14/2018   LDLCALC 23 11/16/2017   LDLDIRECT 50.0 04/14/2018   TRIG 212.0 (H) 04/14/2018   CHOLHDL 3 04/14/2018   Lab Results  Component Value Date   HGBA1C 8.8 (A) 08/30/2018   Lab Results  Component Value Date   VITAMINB12 316 08/30/2015   Lab Results  Component Value Date   TSH 3.65 11/16/2017       ASSESSMENT AND PLAN  Diabetic polyneuropathy associated with type 2 diabetes mellitus (HCC)  Bilateral carpal tunnel syndrome  Recurrent GBS (Guillain-Barre syndrome) (HCC)  Obstructive sleep apnea  Chronic pain syndrome  Dysesthesia   Shameer Molstad A. [name], MD, PhD 04/30/1659, 60:04 PM Certified in Neurology, Clinical Neurophysiology, Sleep Medicine, Pain Medicine and Neuroimaging  [facility] [address] [place],  [address] [phone]

## 2018-12-13 ENCOUNTER — Other Ambulatory Visit: Payer: Self-pay | Admitting: Endocrinology

## 2018-12-21 ENCOUNTER — Other Ambulatory Visit: Payer: Self-pay | Admitting: Internal Medicine

## 2019-01-07 ENCOUNTER — Other Ambulatory Visit: Payer: Self-pay

## 2019-01-07 MED ORDER — GLUCOSE BLOOD VI STRP: ORAL_STRIP | 5 refills | Status: DC

## 2019-01-17 ENCOUNTER — Ambulatory Visit: Payer: Medicare Other | Admitting: Endocrinology

## 2019-01-17 ENCOUNTER — Other Ambulatory Visit: Payer: Self-pay

## 2019-01-17 ENCOUNTER — Encounter: Payer: Self-pay | Admitting: Endocrinology

## 2019-01-17 VITALS — BP 132/88 | HR 110 | Temp 97.8°F | Wt 227.0 lb

## 2019-01-17 DIAGNOSIS — E1165 Type 2 diabetes mellitus with hyperglycemia: Secondary | ICD-10-CM | POA: Diagnosis not present

## 2019-01-17 DIAGNOSIS — E1142 Type 2 diabetes mellitus with diabetic polyneuropathy: Secondary | ICD-10-CM | POA: Diagnosis not present

## 2019-01-17 DIAGNOSIS — IMO0002 Reserved for concepts with insufficient information to code with codable children: Secondary | ICD-10-CM

## 2019-01-17 LAB — POCT GLYCOSYLATED HEMOGLOBIN (HGB A1C): Hemoglobin A1C: 9.4 % — AB (ref 4.0–5.6)

## 2019-01-17 MED ORDER — INSULIN DEGLUDEC 200 UNIT/ML ~~LOC~~ SOPN: 70.0000 [IU] | PEN_INJECTOR | Freq: Every day | SUBCUTANEOUS | 11 refills | Status: DC

## 2019-01-17 NOTE — Patient Instructions (Addendum)
Please increase the tresiba to 70 units daily (I have sent a prescription to your pharmacy, for double-concentrated--you draw to 70 units), and:  continue the same other diabetes medications.  check your blood sugar once a day.  vary the time of day when you check, between before the 3 meals, and at bedtime.  also check if you have symptoms of your blood sugar being too high or too low.  please keep a record of the readings and bring it to your next appointment here (or you can bring the meter itself).  You can write it on any piece of paper.  please call us sooner if your blood sugar goes below 70, or if you have a lot of readings over 200.  Please come back for a follow-up appointment in 2 months.

## 2019-01-17 NOTE — Progress Notes (Signed)
Subjective:    Patient ID: Frank [name], male    DOB: 03-Sep-1955, 63 y.o.   MRN: [id number]  HPI Pt returns for f/u of diabetes mellitus: DM type: Insulin-requiring type 2 Dx'ed: 5027 Complications: polyneuropathy and renal insuff.  Therapy: insulin since 7412, trulicity, and 2 oral meds DKA: never Severe hypoglycemia: never.   Pancreatitis: never Pancreatic imaging: normal on 2006 CT.   Other: he has never been on insulin; he did not tolerate metformin-XR (GI sxs); he takes QD insulin, due to noncompliance; pt injects his own insulin.   Interval history: He brings a record of his cbg's which I have reviewed today.  It varies from 111-295.  It is in general lower as the day goes on.  he says he never misses the insulin. pt states he feels well in general.  Past Medical History:  Diagnosis Date  . Abdominal pain, left lower quadrant 06/06/2010  . Abdominal pain, unspecified site 01/19/2009  . ADD 10/09/2008  . ALLERGIC RHINITIS 10/09/2008  . ANXIETY DEPRESSION 02/01/2008  . COLONIC POLYPS 02/01/2008  . DIABETES MELLITUS, TYPE II 05/20/2010  . DYSPNEA 03/12/2010  . ERECTILE DYSFUNCTION 10/09/2008  . ERECTILE DYSFUNCTION, ORGANIC 05/20/2010  . FOOT PAIN, LEFT 05/20/2010  . GERD 02/01/2008  . Guillain Barr syndrome ([place])   . HEMORRHOIDS 02/01/2008  . HIATAL HERNIA 02/01/2008  . HYPERLIPIDEMIA 10/09/2008  . HYPERTENSION 10/09/2008  . MORTON'S NEUROMA 05/20/2010  . Other specified forms of hearing loss 06/27/2009  . PERIPHERAL EDEMA 05/20/2010  . PERIPHERAL NEUROPATHY 05/20/2010  . SLEEP APNEA, OBSTRUCTIVE 02/01/2008  . Stricture and stenosis of esophagus 02/02/2008  . Type II or unspecified type diabetes mellitus without mention of complication, uncontrolled 11/14/2010  . WRIST PAIN, LEFT 12/05/2009    Past Surgical History:  Procedure Laterality Date  . CARPAL TUNNEL RELEASE    . ESOPHAGEAL DILATION  july 2009  . ESOPHAGOGASTRODUODENOSCOPY N/A 06/27/2014   Procedure:  ESOPHAGOGASTRODUODENOSCOPY (EGD);  Surgeon: Lafayette Dragon, MD;  Location: [facility];  Service: Endoscopy;  Laterality: N/A;  . EYE SURGERY     catract surgery on both eyes  . ROTATOR CUFF REPAIR      Social History   Socioeconomic History  . Marital status: Single    Spouse name: Not on file  . Number of children: Not on file  . Years of education: Not on file  . Highest education level: Not on file  Occupational History  . Occupation: Musician: [employer]  Social Needs  . Financial resource strain: Not on file  . Food insecurity:    Worry: Not on file    Inability: Not on file  . Transportation needs:    Medical: Not on file    Non-medical: Not on file  Tobacco Use  . Smoking status: Never Smoker  . Smokeless tobacco: Never Used  Substance and Sexual Activity  . Alcohol use: Yes    Comment: occasional  . Drug use: Yes    Types: Methaqualone  . Sexual activity: Yes    Comment: before I got sick  Lifestyle  . Physical activity:    Days per week: Not on file    Minutes per session: Not on file  . Stress: Not on file  Relationships  . Social connections:    Talks on phone: Not on file    Gets together: Not on file    Attends religious service: Not on file    Active member of club or  organization: Not on file    Attends meetings of clubs or organizations: Not on file    Relationship status: Not on file  . Intimate partner violence:    Fear of current or ex partner: Not on file    Emotionally abused: Not on file    Physically abused: Not on file    Forced sexual activity: Not on file  Other Topics Concern  . Not on file  Social History Narrative  . Not on file    Current Outpatient Medications on File Prior to Visit  Medication Sig Dispense Refill  . ARIPiprazole (ABILIFY) 10 MG tablet Take 1 tablet (10 mg total) by mouth daily. 90 tablet 3  . aspirin EC 81 MG tablet Take 1 tablet (81 mg total) by mouth daily.    . B-D ULTRAFINE III  SHORT PEN 31G X 8 MM MISC USE AS DIRECTED 100 each 2  . cyclobenzaprine (FLEXERIL) 5 MG tablet TAKE 1 TABLET(5 MG) BY MOUTH AT BEDTIME 30 tablet 11  . DULoxetine (CYMBALTA) 60 MG capsule Take 1 capsule (60 mg total) by mouth 2 (two) times daily. 180 capsule 4  . famotidine (PEPCID) 40 MG tablet TAKE 1 TABLET(40 MG) BY MOUTH TWICE DAILY 180 tablet 3  . glucose blood (ONE TOUCH ULTRA TEST) test strip USE AS DIRECTED 100 each 5  . hydrochlorothiazide (MICROZIDE) 12.5 MG capsule TAKE 1 CAPSULE(12.5 MG) BY MOUTH DAILY 90 capsule 1  . LORazepam (ATIVAN) 0.5 MG tablet TAKE 1 TABLET(0.5 MG) BY MOUTH TWICE DAILY 60 tablet 5  . methylphenidate (CONCERTA) 54 MG PO CR tablet Take 1 tablet (54 mg total) by mouth every morning. 30 tablet 0  . ONETOUCH DELICA LANCETS FINE MISC USE AS DIRECTED EVERY DAY 100 each 5  . pregabalin (LYRICA) 200 MG capsule TAKE (1) CAPSULE TWO TIMES DAILY. 180 capsule 1  . rosuvastatin (CRESTOR) 20 MG tablet TAKE 1 TABLET(20 MG) BY MOUTH DAILY 90 tablet 1  . tapentadol HCl (NUCYNTA) 75 MG tablet Take 1 tablet (75 mg total) by mouth 3 (three) times daily as needed. 90 tablet 0  . TRADJENTA 5 MG TABS tablet TAKE 1 TABLET(5 MG) BY MOUTH DAILY 90 tablet 1  . TRULICITY 1.5 YT/0.1SW SOPN USE 0.5 MLS WEEKLY 6 mL 11   No current facility-administered medications on file prior to visit.     Allergies  Allergen Reactions  . Codeine Itching    REACTION: Itching  . Influenza Vaccines     Due to hx of GBS  . Metformin And Related Other (See Comments)    GI upset, diarrhea  . Shellfish Allergy     Family History  Problem Relation Age of Onset  . Diabetes Mother   . Heart disease Mother   . Hyperlipidemia Mother   . Depression Mother   . Diabetes Brother   . Colon cancer Neg Hx     BP 132/88 (BP Location: Right Arm, Patient Position: Sitting, Cuff Size: Normal)   Pulse (!) 110   Temp 97.8 F (36.6 C) (Oral)   Wt 227 lb (103 kg)   SpO2 94%   BMI 33.52 kg/m    Review of  Systems He denies hypoglycemia and weight change    Objective:   Physical Exam VITAL SIGNS:  See vs page GENERAL: no distress Pulses: dorsalis pedis intact bilat.   MSK: no deformity of the feet CV: no leg edema Skin:  no ulcer on the feet, but the skin is dry.  normal color  and temp on the feet. Neuro: sensation is intact to touch on the feet Ext: There is bilateral onychomycosis of the toenails.      Lab Results  Component Value Date   HGBA1C 9.4 (A) 01/17/2019       Assessment & Plan:  Insulin-requiring type 2 DM, with PN: worse Renal insuff: This limits rx options   Patient Instructions  Please increase the tresiba to 70 units daily (I have sent a prescription to your pharmacy, for double-concentrated--you draw to 70 units), and:  continue the same other diabetes medications.  check your blood sugar once a day.  vary the time of day when you check, between before the 3 meals, and at bedtime.  also check if you have symptoms of your blood sugar being too high or too low.  please keep a record of the readings and bring it to your next appointment here (or you can bring the meter itself).  You can write it on any piece of paper.  please call us sooner if your blood sugar goes below 70, or if you have a lot of readings over 200.  Please come back for a follow-up appointment in 2 months.

## 2019-01-26 ENCOUNTER — Telehealth: Payer: Self-pay | Admitting: Internal Medicine

## 2019-01-26 ENCOUNTER — Other Ambulatory Visit: Payer: Self-pay | Admitting: Neurology

## 2019-01-26 MED ORDER — TAPENTADOL HCL 75 MG PO TABS: 75.0000 mg | ORAL_TABLET | Freq: Three times a day (TID) | ORAL | 0 refills | Status: DC | PRN

## 2019-01-26 MED ORDER — METHYLPHENIDATE HCL ER (OSM) 54 MG PO TBCR: 54.0000 mg | EXTENDED_RELEASE_TABLET | ORAL | 0 refills | Status: DC

## 2019-01-26 NOTE — Telephone Encounter (Signed)
Medication Refill - Medication:   methylphenidate (CONCERTA) 54 MG PO CR tablet [[id number]]  ted their pharmacy? No. (Agent: If no, request that the patient contact the pharmacy for the refill.) (Agent: If yes, when and what did the pharmacy advise?)  Preferred Pharmacy (with phone number or street name): [facility] [place] [phone]  Agent: Please be advised that RX refills may take up to 3 business days. We ask that you follow-up with your pharmacy.

## 2019-01-26 NOTE — Telephone Encounter (Signed)
Done erx 

## 2019-01-26 NOTE — Telephone Encounter (Signed)
.  Pt is requesting a refill of tapentadol HCl (NUCYNTA) 75 MG tablet , to be sent to [facility] #[id number] - [place] [address], [place]

## 2019-02-07 ENCOUNTER — Ambulatory Visit (INDEPENDENT_AMBULATORY_CARE_PROVIDER_SITE_OTHER): Payer: Medicare Other | Admitting: Neurology

## 2019-02-07 ENCOUNTER — Other Ambulatory Visit: Payer: Self-pay

## 2019-02-07 DIAGNOSIS — G4733 Obstructive sleep apnea (adult) (pediatric): Secondary | ICD-10-CM

## 2019-02-07 DIAGNOSIS — G4719 Other hypersomnia: Secondary | ICD-10-CM

## 2019-02-14 NOTE — Progress Notes (Signed)
    [facility] [address] [place], [place] [address]   NAME: Frank [name]                                                              DOB: [birth year] MEDICAL RECORD no:  [id number]                                              DOS: 02/07/2019 REFERRING PHYSICIAN: Brayon Bielefeld A. [name], MD, PhD STUDY PERFORMED: [facility] [name] HISTORY:[name] is a 63 year old man with snoring and excessive daytime sleepiness.   He has an Epworth Sleepiness Scale score of 15/24.  STUDY RESULTS:   Total Recording Time:   9 hours 56 minutes Total Sleep Time:    8 hours 26 minutes  Total Apnea/Hypopnea Index (AHI): 69.3/h; RDI:  69.3/h; REM AHI: 80.9 /h Average Oxygen Saturation: 93 %;  Lowest Oxygen Desaturation:   81%  Total Time Oxygen Saturation Below or at 88 %: 20.8 minutes Average Heart Rate:    96 bpm (between 41 and 128 Bpm)  IMPRESSION:   Severe OSA with an AHI = 69.3  RECOMMENDATION:  Recommend CPAP titration study  Follow up with Dr. [name].  I certify that I have reviewed the raw data recording prior to the issuance of this report in accordance with the standards of the American Academy of Sleep Medicine (AASM).  Arlice Colt, MD, PhD [facility]) Diplomat, ABPN (Neurology and Sleep)

## 2019-02-15 ENCOUNTER — Telehealth: Payer: Self-pay | Admitting: *Deleted

## 2019-02-15 DIAGNOSIS — G4733 Obstructive sleep apnea (adult) (pediatric): Secondary | ICD-10-CM

## 2019-02-15 NOTE — Telephone Encounter (Signed)
Called pt and LVM for him to call office.   Wanted to let him know per Dr. [name] that his sleep study showed severe sleep apnea and he would like to get him started on an autopap machine.  We will send orders to [facility]. Their phone: [phone] We will need to set up a 30-90 day f/u after he starts on autopap for initial f/u with Dr. [name] (around 06/01/19).

## 2019-02-16 NOTE — Telephone Encounter (Signed)
Called, LVM about results and asked pt to call to make appt for next f/u.

## 2019-02-17 NOTE — Telephone Encounter (Signed)
LVM for pt to call office. Sent orders to [facility]. Their phone if he needs it is: [phone]. They should be contacting him to get set up.  He has initial f/u for autopap on 06/06/19 with Dr. [name].

## 2019-02-21 NOTE — Telephone Encounter (Signed)
Patient called back and I relayed the message below to him. He would like a call back from [facility].

## 2019-02-21 NOTE — Telephone Encounter (Signed)
Called and spoke with pt's spouse. Relayed results to him and gave him contact info to [facility] to call and get him set up for autopap. Relayed he has initial f/u 06/06/19 at 1130am with Dr. [name]. Advised him to call back if he has further questions or concerns. He verbalized understanding.

## 2019-02-22 NOTE — Telephone Encounter (Signed)
Ivin Booty with [facility] is calling stating they need office visit notes prior to 06/28

## 2019-02-22 NOTE — Telephone Encounter (Signed)
Faxed last several office notes to [facility] at [phone]. Received fax confirmation.

## 2019-02-23 ENCOUNTER — Other Ambulatory Visit: Payer: Self-pay | Admitting: Internal Medicine

## 2019-03-07 ENCOUNTER — Other Ambulatory Visit: Payer: Self-pay

## 2019-03-07 ENCOUNTER — Ambulatory Visit: Payer: Medicare Other | Admitting: Endocrinology

## 2019-03-07 ENCOUNTER — Encounter: Payer: Self-pay | Admitting: Endocrinology

## 2019-03-07 VITALS — BP 132/88 | HR 118 | Temp 97.6°F | Ht 69.0 in | Wt 225.4 lb

## 2019-03-07 DIAGNOSIS — E1165 Type 2 diabetes mellitus with hyperglycemia: Secondary | ICD-10-CM | POA: Diagnosis not present

## 2019-03-07 DIAGNOSIS — E1142 Type 2 diabetes mellitus with diabetic polyneuropathy: Secondary | ICD-10-CM

## 2019-03-07 DIAGNOSIS — IMO0002 Reserved for concepts with insufficient information to code with codable children: Secondary | ICD-10-CM

## 2019-03-07 LAB — BASIC METABOLIC PANEL
BUN: 14 mg/dL (ref 6–23)
CO2: 30 mEq/L (ref 19–32)
Calcium: 9.6 mg/dL (ref 8.4–10.5)
Chloride: 99 mEq/L (ref 96–112)
Creatinine, Ser: 1.12 mg/dL (ref 0.40–1.50)
GFR: 65.68 mL/min (ref >60.00)
Glucose, Bld: 196 mg/dL — ABNORMAL HIGH (ref 70–99)
Potassium: 4.2 mEq/L (ref 3.5–5.1)
Sodium: 138 mEq/L (ref 135–145)

## 2019-03-07 LAB — TSH: TSH: 1.44 u[IU]/mL (ref 0.35–4.50)

## 2019-03-07 MED ORDER — TRESIBA FLEXTOUCH 200 UNIT/ML ~~LOC~~ SOPN: 80.0000 [IU] | PEN_INJECTOR | Freq: Every day | SUBCUTANEOUS | 11 refills | Status: DC

## 2019-03-07 NOTE — Patient Instructions (Addendum)
Blood tests are requested for you today.  We'll let you know about the results.  Please increase the tresiba to 80 units per day.  continue the same other diabetes medications.  check your blood sugar once a day.  vary the time of day when you check, between before the 3 meals, and at bedtime.  also check if you have symptoms of your blood sugar being too high or too low.  please keep a record of the readings and bring it to your next appointment here (or you can bring the meter itself).  You can write it on any piece of paper.  please call us sooner if your blood sugar goes below 70, or if you have a lot of readings over 200.  Please come back for a follow-up appointment in 6 weeks.

## 2019-03-07 NOTE — Progress Notes (Signed)
Subjective:    Patient ID: Frank [name], male    DOB: January 15, 1956, 63 y.o.   MRN: [id number]  HPI Pt returns for f/u of diabetes mellitus: DM type: Insulin-requiring type 2 Dx'ed: 0388 Complications: polyneuropathy and renal insuff.  Therapy: insulin since 8280, trulicity, and 2 oral meds DKA: never Severe hypoglycemia: never.   Pancreatitis: never Pancreatic imaging: normal on 2006 CT.   Other: he has never been on insulin; he did not tolerate metformin-XR (GI sxs); he takes QD insulin, due to noncompliance; pt injects his own insulin.   Interval history: no cbg record, but states cbg's are in the high-100's.  he says he never misses the insulin.  pt states he feels well in general.   Past Medical History:  Diagnosis Date  . Abdominal pain, left lower quadrant 06/06/2010  . Abdominal pain, unspecified site 01/19/2009  . ADD 10/09/2008  . ALLERGIC RHINITIS 10/09/2008  . ANXIETY DEPRESSION 02/01/2008  . COLONIC POLYPS 02/01/2008  . DIABETES MELLITUS, TYPE II 05/20/2010  . DYSPNEA 03/12/2010  . ERECTILE DYSFUNCTION 10/09/2008  . ERECTILE DYSFUNCTION, ORGANIC 05/20/2010  . FOOT PAIN, LEFT 05/20/2010  . GERD 02/01/2008  . Guillain Barr syndrome ([place])   . HEMORRHOIDS 02/01/2008  . HIATAL HERNIA 02/01/2008  . HYPERLIPIDEMIA 10/09/2008  . HYPERTENSION 10/09/2008  . MORTON'S NEUROMA 05/20/2010  . Other specified forms of hearing loss 06/27/2009  . PERIPHERAL EDEMA 05/20/2010  . PERIPHERAL NEUROPATHY 05/20/2010  . SLEEP APNEA, OBSTRUCTIVE 02/01/2008  . Stricture and stenosis of esophagus 02/02/2008  . Type II or unspecified type diabetes mellitus without mention of complication, uncontrolled 11/14/2010  . WRIST PAIN, LEFT 12/05/2009    Past Surgical History:  Procedure Laterality Date  . CARPAL TUNNEL RELEASE    . ESOPHAGEAL DILATION  july 2009  . ESOPHAGOGASTRODUODENOSCOPY N/A 06/27/2014   Procedure: ESOPHAGOGASTRODUODENOSCOPY (EGD);  Surgeon: Lafayette Dragon, MD;  Location: [facility];   Service: Endoscopy;  Laterality: N/A;  . EYE SURGERY     catract surgery on both eyes  . ROTATOR CUFF REPAIR      Social History   Socioeconomic History  . Marital status: Single    Spouse name: Not on file  . Number of children: Not on file  . Years of education: Not on file  . Highest education level: Not on file  Occupational History  . Occupation: Musician: [employer]  Social Needs  . Financial resource strain: Not on file  . Food insecurity    Worry: Not on file    Inability: Not on file  . Transportation needs    Medical: Not on file    Non-medical: Not on file  Tobacco Use  . Smoking status: Never Smoker  . Smokeless tobacco: Never Used  Substance and Sexual Activity  . Alcohol use: Yes    Comment: occasional  . Drug use: Yes    Types: Methaqualone  . Sexual activity: Yes    Comment: before I got sick  Lifestyle  . Physical activity    Days per week: Not on file    Minutes per session: Not on file  . Stress: Not on file  Relationships  . Social Herbalist on phone: Not on file    Gets together: Not on file    Attends religious service: Not on file    Active member of club or organization: Not on file    Attends meetings of clubs or organizations: Not on file  Relationship status: Not on file  . Intimate partner violence    Fear of current or ex partner: Not on file    Emotionally abused: Not on file    Physically abused: Not on file    Forced sexual activity: Not on file  Other Topics Concern  . Not on file  Social History Narrative  . Not on file    Current Outpatient Medications on File Prior to Visit  Medication Sig Dispense Refill  . ARIPiprazole (ABILIFY) 10 MG tablet Take 1 tablet (10 mg total) by mouth daily. 90 tablet 3  . aspirin EC 81 MG tablet Take 1 tablet (81 mg total) by mouth daily.    . B-D ULTRAFINE III SHORT PEN 31G X 8 MM MISC USE AS DIRECTED 100 each 2  . cyclobenzaprine (FLEXERIL) 5 MG  tablet TAKE 1 TABLET(5 MG) BY MOUTH AT BEDTIME 30 tablet 11  . DULoxetine (CYMBALTA) 60 MG capsule Take 1 capsule (60 mg total) by mouth 2 (two) times daily. 180 capsule 4  . famotidine (PEPCID) 40 MG tablet TAKE 1 TABLET(40 MG) BY MOUTH TWICE DAILY 180 tablet 3  . glucose blood (ONE TOUCH ULTRA TEST) test strip USE AS DIRECTED 100 each 5  . hydrochlorothiazide (MICROZIDE) 12.5 MG capsule TAKE 1 CAPSULE(12.5 MG) BY MOUTH DAILY 90 capsule 1  . Lancets (ONETOUCH DELICA PLUS [id number]) MISC USE AS DIRECTED 100 each 5  . LORazepam (ATIVAN) 0.5 MG tablet TAKE 1 TABLET(0.5 MG) BY MOUTH TWICE DAILY 60 tablet 5  . methylphenidate (CONCERTA) 54 MG PO CR tablet Take 1 tablet (54 mg total) by mouth every morning. 30 tablet 0  . pregabalin (LYRICA) 200 MG capsule TAKE (1) CAPSULE TWO TIMES DAILY. 180 capsule 1  . rosuvastatin (CRESTOR) 20 MG tablet TAKE 1 TABLET(20 MG) BY MOUTH DAILY 90 tablet 1  . tapentadol HCl (NUCYNTA) 75 MG tablet Take 1 tablet (75 mg total) by mouth 3 (three) times daily as needed. 90 tablet 0  . TRADJENTA 5 MG TABS tablet TAKE 1 TABLET(5 MG) BY MOUTH DAILY 90 tablet 1  . TRULICITY 1.5 IR/4.4RX SOPN USE 0.5 MLS WEEKLY 6 mL 11   No current facility-administered medications on file prior to visit.     Allergies  Allergen Reactions  . Codeine Itching    REACTION: Itching  . Influenza Vaccines     Due to hx of GBS  . Metformin And Related Other (See Comments)    GI upset, diarrhea  . Shellfish Allergy     Family History  Problem Relation Age of Onset  . Diabetes Mother   . Heart disease Mother   . Hyperlipidemia Mother   . Depression Mother   . Diabetes Brother   . Colon cancer Neg Hx     BP 132/88 (BP Location: Right Arm, Patient Position: Sitting, Cuff Size: Normal)   Pulse (!) 118   Temp 97.6 F (36.4 C) (Oral)   Ht 5\' 9"  (1.753 m)   Wt 225 lb 6.4 oz (102.2 kg)   SpO2 93%   BMI 33.29 kg/m    Review of Systems He denies hypoglycemia    Objective:    Physical Exam VITAL SIGNS:  See vs page GENERAL: no distress Pulses: dorsalis pedis intact bilat.   MSK: no deformity of the feet CV: no leg edema Skin:  no ulcer on the feet.  normal color and temp on the feet. Neuro: sensation is intact to touch on the feet, but decreased from normal Ext:  there is bilateral onychomycosis of the toenails  Lab Results  Component Value Date   HGBA1C 9.4 (A) 01/17/2019   Lab Results  Component Value Date   CREATININE 1.12 03/07/2019   BUN 14 03/07/2019   NA 138 03/07/2019   K 4.2 03/07/2019   CL 99 03/07/2019   CO2 30 03/07/2019      Assessment & Plan:  Insulin-requiring type 2 DM: he needs increased rx   Patient Instructions  Blood tests are requested for you today.  We'll let you know about the results.  Please increase the tresiba to 80 units per day.  continue the same other diabetes medications.  check your blood sugar once a day.  vary the time of day when you check, between before the 3 meals, and at bedtime.  also check if you have symptoms of your blood sugar being too high or too low.  please keep a record of the readings and bring it to your next appointment here (or you can bring the meter itself).  You can write it on any piece of paper.  please call us sooner if your blood sugar goes below 70, or if you have a lot of readings over 200.  Please come back for a follow-up appointment in 6 weeks.

## 2019-03-14 ENCOUNTER — Telehealth: Payer: Self-pay | Admitting: Internal Medicine

## 2019-03-14 MED ORDER — LINAGLIPTIN 5 MG PO TABS: ORAL_TABLET | ORAL | 1 refills | Status: DC

## 2019-03-14 NOTE — Telephone Encounter (Signed)
Copied from [place] [id number]. Topic: Quick Communication - Rx Refill/Question >> Mar 14, 2019  2:27 PM Leward Quan A wrote: Medication: TRADJENTA 5 MG TABS tablet   Per patient he is out of this medication and need refill today please   Has the patient contacted their pharmacy? Yes.   (Agent: If no, request that the patient contact the pharmacy for the refill.) (Agent: If yes, when and what did the pharmacy advise?)  Preferred Pharmacy (with phone number or street name): [facility] [place], [place] - [address] [place] [phone] (Phone) [phone] (Fax)    Agent: Please be advised that RX refills may take up to 3 business days. We ask that you follow-up with your pharmacy.

## 2019-03-17 ENCOUNTER — Other Ambulatory Visit: Payer: Self-pay | Admitting: Neurology

## 2019-03-17 MED ORDER — TAPENTADOL HCL 75 MG PO TABS: 75.0000 mg | ORAL_TABLET | Freq: Three times a day (TID) | ORAL | 0 refills | Status: DC | PRN

## 2019-03-17 NOTE — Telephone Encounter (Signed)
Pt is requesting a refill of tapentadol HCl (NUCYNTA) 75 MG tablet, to be sent to [facility] #[id number] - [place], [place]

## 2019-04-04 ENCOUNTER — Ambulatory Visit: Payer: Medicare Other | Admitting: Neurology

## 2019-04-15 ENCOUNTER — Other Ambulatory Visit: Payer: Self-pay

## 2019-04-18 ENCOUNTER — Other Ambulatory Visit: Payer: Self-pay | Admitting: Neurology

## 2019-04-20 ENCOUNTER — Telehealth: Payer: Self-pay | Admitting: Internal Medicine

## 2019-04-20 ENCOUNTER — Other Ambulatory Visit: Payer: Self-pay

## 2019-04-20 ENCOUNTER — Ambulatory Visit: Payer: Medicare Other | Admitting: Endocrinology

## 2019-04-20 ENCOUNTER — Encounter: Payer: Self-pay | Admitting: Endocrinology

## 2019-04-20 VITALS — BP 100/60 | HR 131 | Ht 69.0 in | Wt 229.2 lb

## 2019-04-20 DIAGNOSIS — E119 Type 2 diabetes mellitus without complications: Secondary | ICD-10-CM | POA: Diagnosis not present

## 2019-04-20 DIAGNOSIS — E1142 Type 2 diabetes mellitus with diabetic polyneuropathy: Secondary | ICD-10-CM | POA: Diagnosis not present

## 2019-04-20 DIAGNOSIS — IMO0002 Reserved for concepts with insufficient information to code with codable children: Secondary | ICD-10-CM

## 2019-04-20 DIAGNOSIS — E1165 Type 2 diabetes mellitus with hyperglycemia: Secondary | ICD-10-CM

## 2019-04-20 LAB — POCT GLYCOSYLATED HEMOGLOBIN (HGB A1C): Hemoglobin A1C: 8.6 % — AB (ref 4.0–5.6)

## 2019-04-20 MED ORDER — LORAZEPAM 0.5 MG PO TABS: ORAL_TABLET | ORAL | 0 refills | Status: DC

## 2019-04-20 MED ORDER — TRESIBA FLEXTOUCH 200 UNIT/ML ~~LOC~~ SOPN: 90.0000 [IU] | PEN_INJECTOR | Freq: Every day | SUBCUTANEOUS | 11 refills | Status: DC

## 2019-04-20 NOTE — Telephone Encounter (Signed)
Ativan done 1 mo only erx per office refill policy  Please let pt know he is due for ROV for further refills

## 2019-04-20 NOTE — Patient Instructions (Addendum)
Please increase the tresiba to 90 units per day.  continue the same other diabetes medications.  Please see [name] soon, to find out why your heart is racing.   check your blood sugar once a day.  vary the time of day when you check, between before the 3 meals, and at bedtime.  also check if you have symptoms of your blood sugar being too high or too low.  please keep a record of the readings and bring it to your next appointment here (or you can bring the meter itself).  You can write it on any piece of paper.  please call us sooner if your blood sugar goes below 70, or if you have a lot of readings over 200.  Please come back for a follow-up appointment in 2 months.

## 2019-04-20 NOTE — Progress Notes (Signed)
Subjective:    Patient ID: Frank [name], male    DOB: [birth year], 63 y.o.   MRN: [id number]  HPI Pt returns for f/u of diabetes mellitus: DM type: Insulin-requiring type 2 Dx'ed: 0000000 Complications: polyneuropathy and renal insuff.  Therapy: insulin since [id number], trulicity, and 2 oral meds DKA: never Severe hypoglycemia: never.   Pancreatitis: never Pancreatic imaging: normal on 2006 CT.   Other: he has never been on insulin; he did not tolerate metformin-XR (GI sxs); he takes QD insulin, due to noncompliance; pt injects his own insulin.   Interval history: He brings a record of his cbg's which I have reviewed today.  cbg's vary from 114-299.  There is no trend throughout the day.  he says he never misses the insulin.  pt states he feels well in general.   Past Medical History:  Diagnosis Date  . Abdominal pain, left lower quadrant 06/06/2010  . Abdominal pain, unspecified site 01/19/2009  . ADD 10/09/2008  . ALLERGIC RHINITIS 10/09/2008  . ANXIETY DEPRESSION 02/01/2008  . COLONIC POLYPS 02/01/2008  . DIABETES MELLITUS, TYPE II 05/20/2010  . DYSPNEA 03/12/2010  . ERECTILE DYSFUNCTION 10/09/2008  . ERECTILE DYSFUNCTION, ORGANIC 05/20/2010  . FOOT PAIN, LEFT 05/20/2010  . GERD 02/01/2008  . Guillain Barr syndrome ([place])   . HEMORRHOIDS 02/01/2008  . HIATAL HERNIA 02/01/2008  . HYPERLIPIDEMIA 10/09/2008  . HYPERTENSION 10/09/2008  . MORTON'S NEUROMA 05/20/2010  . Other specified forms of hearing loss 06/27/2009  . PERIPHERAL EDEMA 05/20/2010  . PERIPHERAL NEUROPATHY 05/20/2010  . SLEEP APNEA, OBSTRUCTIVE 02/01/2008  . Stricture and stenosis of esophagus 02/02/2008  . Type II or unspecified type diabetes mellitus without mention of complication, uncontrolled 11/14/2010  . WRIST PAIN, LEFT 12/05/2009    Past Surgical History:  Procedure Laterality Date  . CARPAL TUNNEL RELEASE    . ESOPHAGEAL DILATION  july 2009  . ESOPHAGOGASTRODUODENOSCOPY N/A 06/27/2014   Procedure:  ESOPHAGOGASTRODUODENOSCOPY (EGD);  Surgeon: Lafayette Dragon, MD;  Location: [facility];  Service: Endoscopy;  Laterality: N/A;  . EYE SURGERY     catract surgery on both eyes  . ROTATOR CUFF REPAIR      Social History   Socioeconomic History  . Marital status: Single    Spouse name: Not on file  . Number of children: Not on file  . Years of education: Not on file  . Highest education level: Not on file  Occupational History  . Occupation: Musician: [employer]  Social Needs  . Financial resource strain: Not on file  . Food insecurity    Worry: Not on file    Inability: Not on file  . Transportation needs    Medical: Not on file    Non-medical: Not on file  Tobacco Use  . Smoking status: Never Smoker  . Smokeless tobacco: Never Used  Substance and Sexual Activity  . Alcohol use: Yes    Comment: occasional  . Drug use: Yes    Types: Methaqualone  . Sexual activity: Yes    Comment: before I got sick  Lifestyle  . Physical activity    Days per week: Not on file    Minutes per session: Not on file  . Stress: Not on file  Relationships  . Social Herbalist on phone: Not on file    Gets together: Not on file    Attends religious service: Not on file    Active member of club or organization:  Not on file    Attends meetings of clubs or organizations: Not on file    Relationship status: Not on file  . Intimate partner violence    Fear of current or ex partner: Not on file    Emotionally abused: Not on file    Physically abused: Not on file    Forced sexual activity: Not on file  Other Topics Concern  . Not on file  Social History Narrative  . Not on file    Current Outpatient Medications on File Prior to Visit  Medication Sig Dispense Refill  . ARIPiprazole (ABILIFY) 10 MG tablet Take 1 tablet (10 mg total) by mouth daily. 90 tablet 3  . aspirin EC 81 MG tablet Take 1 tablet (81 mg total) by mouth daily.    . B-D ULTRAFINE III  SHORT PEN 31G X 8 MM MISC USE AS DIRECTED 100 each 2  . cyclobenzaprine (FLEXERIL) 5 MG tablet TAKE 1 TABLET(5 MG) BY MOUTH AT BEDTIME 30 tablet 11  . DULoxetine (CYMBALTA) 60 MG capsule Take 1 capsule (60 mg total) by mouth 2 (two) times daily. 180 capsule 4  . famotidine (PEPCID) 40 MG tablet TAKE 1 TABLET(40 MG) BY MOUTH TWICE DAILY 180 tablet 3  . glucose blood (ONE TOUCH ULTRA TEST) test strip USE AS DIRECTED 100 each 5  . hydrochlorothiazide (MICROZIDE) 12.5 MG capsule TAKE 1 CAPSULE(12.5 MG) BY MOUTH DAILY 90 capsule 1  . Lancets (ONETOUCH DELICA PLUS Q000111Q) MISC USE AS DIRECTED 100 each 5  . linagliptin (TRADJENTA) 5 MG TABS tablet TAKE 1 TABLET(5 MG) BY MOUTH DAILY 90 tablet 1  . methylphenidate (CONCERTA) 54 MG PO CR tablet Take 1 tablet (54 mg total) by mouth every morning. 30 tablet 0  . pregabalin (LYRICA) 200 MG capsule TAKE (1) CAPSULE TWO TIMES DAILY. 180 capsule 1  . rosuvastatin (CRESTOR) 20 MG tablet TAKE 1 TABLET(20 MG) BY MOUTH DAILY 90 tablet 1  . tapentadol HCl (NUCYNTA) 75 MG tablet Take 1 tablet (75 mg total) by mouth 3 (three) times daily as needed. 90 tablet 0  . TRULICITY 1.5 0000000 SOPN USE 0.5 MLS WEEKLY 6 mL 11   No current facility-administered medications on file prior to visit.     Allergies  Allergen Reactions  . Codeine Itching    REACTION: Itching  . Influenza Vaccines     Due to hx of GBS  . Metformin And Related Other (See Comments)    GI upset, diarrhea  . Shellfish Allergy     Family History  Problem Relation Age of Onset  . Diabetes Mother   . Heart disease Mother   . Hyperlipidemia Mother   . Depression Mother   . Diabetes Brother   . Colon cancer Neg Hx     BP 100/60 (BP Location: Left Arm, Patient Position: Sitting, Cuff Size: Normal)   Pulse (!) 131   Ht 5\' 9"  (1.753 m)   Wt 229 lb 3.2 oz (104 kg)   SpO2 92%   BMI 33.85 kg/m    Review of Systems He denies hypoglycemia.      Objective:   Physical Exam VITAL SIGNS:   See vs page GENERAL: no distress Pulses: dorsalis pedis intact bilat.   MSK: no deformity of the feet CV: no leg edema.   Skin:  no ulcer on the feet, but the skin is dry  normal color and temp on the feet.  Neuro: sensation is intact to touch on the feet, but severely  decreased from normal.   Ext: there is bilateral onychomycosis of the toenails.    A1c=8.6%  Lab Results  Component Value Date   CREATININE 1.12 03/07/2019   BUN 14 03/07/2019   NA 138 03/07/2019   K 4.2 03/07/2019   CL 99 03/07/2019   CO2 30 03/07/2019      Assessment & Plan:  Tachycardia, worse, uncertain etiology Insulin-requiring type 2 DM, with renal insuff: he needs increased rx    Patient Instructions  Please increase the tresiba to 90 units per day.  continue the same other diabetes medications.  Please see [name] soon, to find out why your heart is racing.   check your blood sugar once a day.  vary the time of day when you check, between before the 3 meals, and at bedtime.  also check if you have symptoms of your blood sugar being too high or too low.  please keep a record of the readings and bring it to your next appointment here (or you can bring the meter itself).  You can write it on any piece of paper.  please call us sooner if your blood sugar goes below 70, or if you have a lot of readings over 200.  Please come back for a follow-up appointment in 2 months.

## 2019-04-27 ENCOUNTER — Encounter: Payer: Medicare Other | Admitting: Internal Medicine

## 2019-05-17 ENCOUNTER — Other Ambulatory Visit: Payer: Self-pay | Admitting: Internal Medicine

## 2019-05-17 MED ORDER — HYDROCHLOROTHIAZIDE 12.5 MG PO CAPS: ORAL_CAPSULE | ORAL | 0 refills | Status: DC

## 2019-05-17 NOTE — Telephone Encounter (Signed)
Copied from [place] [id number]. Topic: Quick Communication - Rx Refill/Question >> May 17, 2019  1:20 PM Mcneil, Ja-Kwan wrote: Medication: hydrochlorothiazide (MICROZIDE) 12.5 MG capsule  Has the patient contacted their pharmacy? yes   Preferred Pharmacy (with phone number or street name): [id number] - [place], [place] - [address] [place] [phone] (Phone)  [phone] (Fax)  Agent: Please be advised that RX refills may take up to 3 business days. We ask that you follow-up with your pharmacy.

## 2019-05-23 ENCOUNTER — Other Ambulatory Visit: Payer: Self-pay | Admitting: Neurology

## 2019-05-23 ENCOUNTER — Other Ambulatory Visit: Payer: Self-pay | Admitting: Internal Medicine

## 2019-05-23 MED ORDER — TAPENTADOL HCL 75 MG PO TABS: 75.0000 mg | ORAL_TABLET | Freq: Three times a day (TID) | ORAL | 0 refills | Status: DC | PRN

## 2019-05-23 MED ORDER — LORAZEPAM 0.5 MG PO TABS: ORAL_TABLET | ORAL | 0 refills | Status: DC

## 2019-05-23 NOTE — Telephone Encounter (Signed)
Pt is requesting a refill of tapentadol HCl (NUCYNTA) 75 MG tablet , to be sent to  [id number] - [place], [place] [address] [place] [phone] (Phone)

## 2019-05-23 NOTE — Telephone Encounter (Signed)
Done erx  Please let pt or caretaker know that pt is due for yearly OV

## 2019-05-23 NOTE — Telephone Encounter (Signed)
Medication Refill - Medication: LORazepam (ATIVAN) 0.5 MG tablet   Has the patient contacted their pharmacy? Yes.   (Agent: If no, request that the patient contact the pharmacy for the refill.) (Agent: If yes, when and what did the pharmacy advise?)  Preferred Pharmacy (with phone number or street name):  [facility] [place], [place] - [address]  [place] [address]  Phone: [phone] Fax: [phone]     Agent: Please be advised that RX refills may take up to 3 business days. We ask that you follow-up with your pharmacy.

## 2019-05-23 NOTE — Telephone Encounter (Signed)
[place] Controlled Database Checked Last filled: 04/22/19 # 60 LOV w/you: 04/19/18 Next appt w/you: 06/08/19

## 2019-05-23 NOTE — Telephone Encounter (Signed)
Checked drug registry. He last refilled Nucynta 03/18/19 #90. Last seen 11/22/18 and next follow up 06/06/19

## 2019-05-23 NOTE — Telephone Encounter (Signed)
Requested medication (s) are due for refill today: yes  Requested medication (s) are on the active medication list: yes  Last refill: 04/20/2019  Future visit scheduled: yes  Notes to clinic:  Refill cannot be delegated    Requested Prescriptions  Pending Prescriptions Disp Refills   LORazepam (ATIVAN) 0.5 MG tablet 60 tablet 0    Sig: TAKE 1 TABLET(0.5 MG) BY MOUTH TWICE DAILY     Not Delegated - Psychiatry:  Anxiolytics/Hypnotics Failed - 05/23/2019  3:02 PM      Failed - This refill cannot be delegated      Failed - Urine Drug Screen completed in last 360 days.      Failed - Valid encounter within last 6 months    Recent Outpatient Visits          1 year ago Diabetic polyneuropathy associated with type 2 diabetes mellitus ([place])   [facility] -Georges Mouse, MD   1 year ago Preventative health care   [facility] -Georges Mouse, MD   1 year ago Hyperlipidemia, unspecified hyperlipidemia type   [facility] -Georges Mouse, MD   2 years ago Preventative health care   [facility] -Georges Mouse, MD   3 years ago Encounter for well adult exam with abnormal findings   [facility] -Georges Mouse, MD      Future Appointments            In 2 weeks Jenny Reichmann, Hunt Oris, MD [place], [facility]

## 2019-05-25 ENCOUNTER — Encounter: Payer: Self-pay | Admitting: Gastroenterology

## 2019-06-06 ENCOUNTER — Other Ambulatory Visit: Payer: Self-pay

## 2019-06-06 ENCOUNTER — Encounter: Payer: Self-pay | Admitting: Neurology

## 2019-06-06 ENCOUNTER — Ambulatory Visit: Payer: Medicare Other | Admitting: Neurology

## 2019-06-06 VITALS — BP 130/78 | HR 114 | Temp 97.6°F | Ht 69.0 in | Wt 230.5 lb

## 2019-06-06 DIAGNOSIS — E1142 Type 2 diabetes mellitus with diabetic polyneuropathy: Secondary | ICD-10-CM | POA: Diagnosis not present

## 2019-06-06 DIAGNOSIS — G4719 Other hypersomnia: Secondary | ICD-10-CM

## 2019-06-06 DIAGNOSIS — G4733 Obstructive sleep apnea (adult) (pediatric): Secondary | ICD-10-CM

## 2019-06-06 DIAGNOSIS — G5603 Carpal tunnel syndrome, bilateral upper limbs: Secondary | ICD-10-CM

## 2019-06-06 DIAGNOSIS — R208 Other disturbances of skin sensation: Secondary | ICD-10-CM

## 2019-06-06 DIAGNOSIS — G894 Chronic pain syndrome: Secondary | ICD-10-CM

## 2019-06-06 NOTE — Progress Notes (Signed)
[facility]  PATIENT: Frank [name] DOB: 25-Oct-1955  _________________________________   HISTORICAL  CHIEF COMPLAINT:  Chief Complaint  Patient presents with  . Follow-up    RM 13, alone. Last seen 11/22/2018. Takes Lyrica BID and Nucynta. No new sx, doing well     HISTORY OF PRESENT ILLNESS:  Frank [name] is a 63 year old man with a chronic peripheral neuropathy who had superimposed Guillain Barr syndrome in November 2015 and has had severe neuropathic pain  Update 06/06/2019: After the last visit we tested a Home sleep study and he ws found to have severe OSA (AHI = 69.3).   He was placed on Auto-PAP but only tried off/on for a few weeks.   He felt suffocated and stopped using it.  The longest he used it any night was 8 hours and he felt he could not sleep with it.      He has a full facemask.    I discussed option:   CPAP titration in-lab to try to optimize PAP treatment, referral to surgery or weight loss with oral appliance.    He continues to report difficulties with his polyneuropathy.  He is noting more pain since the Lyrica was reduced.    He is still taking Nucynta 75 mg tid, Lyrica 200 mg po bid and Cymbalta 60 mg po bid.    His PCP prescribes lorazepam 0.5 mg and methylphenidate ER 54 qAM.    EPWORTH SLEEPINESS SCALE  On a scale of 0 - 3 what is the chance of dozing:  Sitting and Reading:   2 Watching TV:    2 Sitting inactive in a public place: 1 Passenger in car for one hour: 3 Lying down to rest in the afternoon: 3 Sitting and talking to someone: 0 Sitting quietly after lunch:  3 In a car, stopped in traffic:  0  Does not drive  Total (out of 24):   14/24 moderate ESS     Update 11/22/2018 (virtual): He feels he is doing about the same.  The worse pain is in his hands.   He has numbness up to his knees.  With the medication, he feels his pain is tolerable.  He has not shown any drug escalation or inappropriate behavior.  [place] controlled substance database was reviewed and he has been compliant.  He feels his gait is worse.   Balance is reduced.   He only walks around his house.     He is very sleepy during the day.  This is felt to be worse by his husband.  He takes Lyrica 200 mg po tid.   He takes ativan twice a day. He takes Nucynta 75 mg po tid and feels it helps him a lot.     He takes flexeril 5 mg at bedtime only.     He has OSA (saw Dr. Annamaria Boots in 2008) but never started CPAP.  We were going to repeat the PSG but the expense was going to be too high for him and it has not been done.  EPWORTH SLEEPINESS SCALE  On a scale of 0 - 3 what is the chance of dozing:  Sitting and Reading:  2 Watching TV:   2 Sitting inactive in a public place: 2 Passenger in car for one hour: 3 Lying down to rest in the afternoon: 3 Sitting and talking to someone: 0 Sitting quietly after lunch:  3 In a car, stopped in traffic:  0  Does not drive  Total (out of 24):   15/24 moderate ESS      Update 06/28/18: He is doing about the same.     The medications help t take the edge off of the neuropathic pain.    Nucynta helps the most.     The [place] shows good compliance.     The leg pain goes up to his knees.   There is numbness and the pain is severe pins/needles.  Also pain in his hands. Pain is worse with walking. He feels his gait is stable and has no falls.    His stride is about the same.   He has some ankle/toe weakness.      He notes some weakness in his hands and also has stiffness in the fingers.  He never had the CPAP titration.  He dozes off in the afternoons.    He canceled the CPAP titration earlier because of expense (has no MCR supplement)  Update 03/08/2018: He feels he is doing about the same.   He feels he gets some benefit from Lyrica, Duloxetine and Nucynta.   He tolerates them well.   No s.e.   No constipation.   The [place] has been reviewed and he is not getting medications from other sites.    No drug seeking behavior.   He has the worse pn in his feet, up to the knees and in his hands.   There is numbness as well.   He feels the symptoms are stable.    Activity like walking increases the pain and rest reduces pain some.   He has DM.     He has OSA but never had the CPAP titration.  Due to high cost, he never had the titration.      He gets drowsy in the evenings and frequently dozes off watching TV.      Mood is doing ok.    The combination of Cymbalta and Abilify has helped hte depression and anxiety.      From 10/19/2017: He feels the neuropathic dysesthetic pain is stable.   Lyrica and Nucynta and Duloxetine have helped.    He tolerates them well.    He has not escalated Nucynta use or shown drug seeking behavior.    The [place] was reviewed and is appropriate.     The foot numbness is the same.    His CTS hand pain is about the same and bothers him every day.    Surgery and injections had not helped much.     His OSA remains untreated by his choice.   He does not think he could use CPAP or an oral appliance.   He dozes off some evenings but not everyday ([place] is high at 14/24).   He has lost 10 pounds since starting insulin.    His depression seems to be ok on Cymbalta and Abilify.    Anxiety is mild on these med's.       From 9/10/20198: Chronic neuropathic pain:  He feels the leg pain is stable.    Pain is up to the knees and in the hands.   Current pain is treated with Lyrica, Duloxetine and Nucynta.  His polyneuropathy pain started a few years before he was diagnosed with NIDDM .   He had GBS in November 2015 and dysesthesias worsened after that.  He has numbness up to his knees and in both hands.    He stumbles but no falls.     He  also notes weakness in his feet and hands.     Mood:   He feels the depression is a little worse than last visit.    He initially did better on combination of Cymbalta and Abilify.    He tolerates both of these medications well.    He also takes  lorazepam for anxiety.    CTS:   His left hand pain is unchanged and still bothersome at times.     He has a history of carpal tunnel syndrome.   He had surgery in the past.   He is not wearing the splint much since it did not help.    In the past, carpal tunnel injections have helped a little bit .    OSA:   He was diagnosed with OSA around 2011 but never started CPAP.   He does not want to use CPAP or an oral appliance.   He notes fatigue and sleepiness.   He falls asleep watching TV and in the evenings.         EPWORTH SLEEPINESS SCALE  On a scale of 0 - 3 what is the chance of dozing:  Sitting and Reading:   1 Watching TV:    3 Sitting inactive in a public place: 1 Passenger in car for one hour: 3 Lying down to rest in the afternoon: 3 Sitting and talking to someone: 0 Sitting quietly after lunch:  3 In a car, stopped in traffic:  0  Total (out of 24):  14/24  (mild EDS)   GBS History:   The initial episode of GBS was treated with plasmapheresis. He reports that the diagnosis took a couple weeks and he had several emergency room visits. Initially he was sent to a skilled nursing facility. He was reporting a lot of back pain alongside the weakness. A lumbar puncture was eventually done showing high protein which led to the dye diagnosis of GBS.  He had severe weakness at the peak and required intubation for respiratory support. At that time, he was unable to move his legs and could barely move his arms. He improved quite a bit after plasmapheresis and was discharged. However, a couple days after discharge he began to feel weak again and return to the emergency room. He was recently admitted for recurrent GBS. For that hospital stay he did not require intubation but did have a facemask oxygen. He also was treated with IVIG the second time around. He improved and was discharged home. He did outpatient physical therapy. He has had a fairly good in movement of his strength and is able to walk  independently. However, he has had more dysesthetic pain.   With the exception of more pain, he otherwise feels very close to his pre-GBS baseline.   Of note, he had a flu shot a few weeks before the first episode of GBS.  MRI's from 06/2014 and 07/2014:    Cervical MRI shows spinal stenosis at C4-C5, C5-C6 and C6-C7. There is no spinal cord compression. Thoracic spine shows T8-T9 disc extrusion to the left that does not cause spinal cord compression. There are mild degenerative changes in the lumbar spine. Contrast was not used.   I also reviewed many of the notes and labs from his hospital stay. CSF Protein was greatly elevated at 118 and the CSF white blood cell count was 1.    REVIEW OF SYSTEMS: Constitutional: No fevers, chills, sweats, or change in appetite.   Has sleepiness Eyes: No visual changes,  double vision, eye pain Ear, nose and throat: No hearing loss, ear pain, nasal congestion, sore throat Cardiovascular: No chest pain, palpitations Respiratory: No shortness of breath at rest or with exertion.   No wheezes.  He has OSA GastrointestinaI: No nausea, vomiting, diarrhea, abdominal pain, fecal incontinence Genitourinary: No dysuria, urinary retention or frequency.  No nocturia. Musculoskeletal: No neck pain, back pain Integumentary: No rash, pruritus, skin lesions Neurological: as above Psychiatric: Anxiety and depression at this time.   ADD is stable. Endocrine: No palpitations, diaphoresis, change in appetite, change in weigh or increased thirst Hematologic/Lymphatic: No anemia, purpura, petechiae. Allergic/Immunologic: No itchy/runny eyes, nasal congestion, recent allergic reactions, rashes  ALLERGIES: Allergies  Allergen Reactions  . Codeine Itching    REACTION: Itching  . Influenza Vaccines     Due to hx of GBS  . Metformin And Related Other (See Comments)    GI upset, diarrhea  . Shellfish Allergy     HOME MEDICATIONS:  Current Outpatient Medications:  .   ARIPiprazole (ABILIFY) 10 MG tablet, Take 1 tablet (10 mg total) by mouth daily., Disp: 90 tablet, Rfl: 3 .  aspirin EC 81 MG tablet, Take 1 tablet (81 mg total) by mouth daily., Disp: , Rfl:  .  B-D ULTRAFINE III SHORT PEN 31G X 8 MM MISC, USE AS DIRECTED, Disp: 100 each, Rfl: 2 .  cyclobenzaprine (FLEXERIL) 5 MG tablet, TAKE 1 TABLET(5 MG) BY MOUTH AT BEDTIME, Disp: 30 tablet, Rfl: 11 .  DULoxetine (CYMBALTA) 60 MG capsule, Take 1 capsule (60 mg total) by mouth 2 (two) times daily., Disp: 180 capsule, Rfl: 4 .  famotidine (PEPCID) 40 MG tablet, TAKE 1 TABLET(40 MG) BY MOUTH TWICE DAILY, Disp: 180 tablet, Rfl: 3 .  glucose blood (ONE TOUCH ULTRA TEST) test strip, USE AS DIRECTED, Disp: 100 each, Rfl: 5 .  hydrochlorothiazide (MICROZIDE) 12.5 MG capsule, TAKE 1 CAPSULE(12.5 MG) BY MOUTH DAILY, Disp: 30 capsule, Rfl: 0 .  Insulin Degludec (TRESIBA FLEXTOUCH) 200 UNIT/ML SOPN, Inject 90 Units into the skin daily., Disp: 6 pen, Rfl: 11 .  Lancets (ONETOUCH DELICA PLUS Q000111Q) MISC, USE AS DIRECTED, Disp: 100 each, Rfl: 5 .  linagliptin (TRADJENTA) 5 MG TABS tablet, TAKE 1 TABLET(5 MG) BY MOUTH DAILY, Disp: 90 tablet, Rfl: 1 .  LORazepam (ATIVAN) 0.5 MG tablet, TAKE 1 TABLET(0.5 MG) BY MOUTH TWICE DAILY, Disp: 60 tablet, Rfl: 0 .  methylphenidate (CONCERTA) 54 MG PO CR tablet, Take 1 tablet (54 mg total) by mouth every morning., Disp: 30 tablet, Rfl: 0 .  pregabalin (LYRICA) 200 MG capsule, TAKE (1) CAPSULE TWO TIMES DAILY., Disp: 180 capsule, Rfl: 1 .  rosuvastatin (CRESTOR) 20 MG tablet, TAKE 1 TABLET(20 MG) BY MOUTH DAILY, Disp: 90 tablet, Rfl: 1 .  tapentadol HCl (NUCYNTA) 75 MG tablet, Take 1 tablet (75 mg total) by mouth 3 (three) times daily as needed., Disp: 90 tablet, Rfl: 0 .  TRULICITY 1.5 0000000 SOPN, USE 0.5 MLS WEEKLY, Disp: 6 mL, Rfl: 11  PAST MEDICAL HISTORY: Past Medical History:  Diagnosis Date  . Abdominal pain, left lower quadrant 06/06/2010  . Abdominal pain, unspecified  site 01/19/2009  . ADD 10/09/2008  . ALLERGIC RHINITIS 10/09/2008  . ANXIETY DEPRESSION 02/01/2008  . COLONIC POLYPS 02/01/2008  . DIABETES MELLITUS, TYPE II 05/20/2010  . DYSPNEA 03/12/2010  . ERECTILE DYSFUNCTION 10/09/2008  . ERECTILE DYSFUNCTION, ORGANIC 05/20/2010  . FOOT PAIN, LEFT 05/20/2010  . GERD 02/01/2008  . Guillain Barr syndrome ([place])   .  HEMORRHOIDS 02/01/2008  . HIATAL HERNIA 02/01/2008  . HYPERLIPIDEMIA 10/09/2008  . HYPERTENSION 10/09/2008  . MORTON'S NEUROMA 05/20/2010  . Other specified forms of hearing loss 06/27/2009  . PERIPHERAL EDEMA 05/20/2010  . PERIPHERAL NEUROPATHY 05/20/2010  . SLEEP APNEA, OBSTRUCTIVE 02/01/2008  . Stricture and stenosis of esophagus 02/02/2008  . Type II or unspecified type diabetes mellitus without mention of complication, uncontrolled 11/14/2010  . WRIST PAIN, LEFT 12/05/2009    PAST SURGICAL HISTORY: Past Surgical History:  Procedure Laterality Date  . CARPAL TUNNEL RELEASE    . ESOPHAGEAL DILATION  july 2009  . ESOPHAGOGASTRODUODENOSCOPY N/A 06/27/2014   Procedure: ESOPHAGOGASTRODUODENOSCOPY (EGD);  Surgeon: Lafayette Dragon, MD;  Location: [facility];  Service: Endoscopy;  Laterality: N/A;  . EYE SURGERY     catract surgery on both eyes  . ROTATOR CUFF REPAIR      FAMILY HISTORY: Family History  Problem Relation Age of Onset  . Diabetes Mother   . Heart disease Mother   . Hyperlipidemia Mother   . Depression Mother   . Diabetes Brother   . Colon cancer Neg Hx     SOCIAL HISTORY:  Social History   Socioeconomic History  . Marital status: Single    Spouse name: Not on file  . Number of children: Not on file  . Years of education: Not on file  . Highest education level: Not on file  Occupational History  . Occupation: Musician: [employer]  Social Needs  . Financial resource strain: Not on file  . Food insecurity    Worry: Not on file    Inability: Not on file  . Transportation needs    Medical: Not  on file    Non-medical: Not on file  Tobacco Use  . Smoking status: Never Smoker  . Smokeless tobacco: Never Used  Substance and Sexual Activity  . Alcohol use: Yes    Comment: occasional  . Drug use: Yes    Types: Methaqualone  . Sexual activity: Yes    Comment: before I got sick  Lifestyle  . Physical activity    Days per week: Not on file    Minutes per session: Not on file  . Stress: Not on file  Relationships  . Social Herbalist on phone: Not on file    Gets together: Not on file    Attends religious service: Not on file    Active member of club or organization: Not on file    Attends meetings of clubs or organizations: Not on file    Relationship status: Not on file  . Intimate partner violence    Fear of current or ex partner: Not on file    Emotionally abused: Not on file    Physically abused: Not on file    Forced sexual activity: Not on file  Other Topics Concern  . Not on file  Social History Narrative  . Not on file     PHYSICAL EXAM  Vitals:   06/06/19 1132  BP: 130/78  Pulse: (!) 114  Temp: 97.6 F (36.4 C)  Weight: 230 lb 8 oz (104.6 kg)  Height: 5\' 9"  (1.753 m)    Body mass index is 34.04 kg/m.   General: The patient is well-developed and well-nourished and in no acute distress  Skin: Extremities are without rash. He has mild pedal edema, slightly worse on the left.    HEENT:   His pharynx is [place] 4.  Neurologic Exam  Mental status: The patient is alert and oriented x 3 at the time of the examination. The patient has apparent normal recent and remote memory, with an apparently normal attention span and concentration ability.   Speech is normal.  Cranial nerves: Extraocular movements are full. Facial strength and sensation are normal..  Trapezius and sternocleidomastoid strength is normal. No dysarthria is noted.  The tongue is midline, and the patient has symmetric elevation of the soft palate. No obvious hearing  deficits are noted.  Motor:  Muscle bulk is normal. Muscle tone is normal. Strength is 5/5 proximally in the arms and legs. He has mild weakness in the intrinsiic hand muscles and feet/ankles with 4+ strength in interossei and APB, EHL and 4+ in ankle extension   Sensory: Sensory testing is intact in wrists but reduced vib (normal touch) at fingertips.   There is mild reduced vibration sensation at the knees, severe loss at ankles; absent at toes.   Touch is reduced below mid shin and very educed in the feet.  Coordination: He has good finger-nose-finger but reduced heel-to-shin bilaterally.  Gait and station: Station is normal.   The gait has a reduced stride.   Tandem gait is poor and wide. Romberg is positive.   Reflexes: Deep tendon reflexes are symmetric and 1+ bilaterally at deltoid and trace at knees, absent at the ankles.   DIAGNOSTIC DATA (LABS, IMAGING, TESTING) - I reviewed patient records, labs, notes, testing and imaging myself where available.  Lab Results  Component Value Date   WBC 9.5 11/16/2017   HGB 16.4 11/16/2017   HCT 46.9 11/16/2017   MCV 89.0 11/16/2017   PLT 197.0 11/16/2017      Component Value Date/Time   NA 138 03/07/2019 1125   K 4.2 03/07/2019 1125   CL 99 03/07/2019 1125   CO2 30 03/07/2019 1125   GLUCOSE 196 (H) 03/07/2019 1125   BUN 14 03/07/2019 1125   CREATININE 1.12 03/07/2019 1125   CALCIUM 9.6 03/07/2019 1125   PROT 7.2 04/14/2018 0857   PROT 7.0 08/30/2015 1157   ALBUMIN 4.1 04/14/2018 0857   AST 16 04/14/2018 0857   ALT 17 04/14/2018 0857   ALKPHOS 68 04/14/2018 0857   BILITOT 0.9 04/14/2018 0857   GFRNONAA >90 08/06/2014 0240   GFRAA >90 08/06/2014 0240   Lab Results  Component Value Date   CHOL 110 04/14/2018   HDL 38.90 (L) 04/14/2018   LDLCALC 23 11/16/2017   LDLDIRECT 50.0 04/14/2018   TRIG 212.0 (H) 04/14/2018   CHOLHDL 3 04/14/2018   Lab Results  Component Value Date   HGBA1C 8.6 (A) 04/20/2019   Lab Results   Component Value Date   VITAMINB12 316 08/30/2015   Lab Results  Component Value Date   TSH 1.44 03/07/2019       ASSESSMENT AND PLAN  Diabetic polyneuropathy associated with type 2 diabetes mellitus (HCC)  Obstructive sleep apnea - Plan: Cpap titration  Bilateral carpal tunnel syndrome  Chronic pain syndrome  Dysesthesia  Excessive daytime sleepiness  1.  Continue Lyrica and Nucynta and Cymbalta for neuropathic pain. 2.   We had a long discussion about his sleep apnea.  It is severe and should be treated to help reduce the risk of heart attack, stroke, arrhythmia and sudden death.  Additionally, I am concerned because he is on Nucynta, Lyrica and lorazepam and these might suppress his breathing some.  I will have him do an in lab CPAP titration in  the hope that we can better help him get used to PAP.  If pressures are high he might also do better with BiPAP. 3.    Stay active and exercise as tolerated. 4.    Return in 4 months or sooner if there are new or worsening neurologic symptoms.  Richard A. [name], MD, PhD [id number], [id number] PM Certified in Neurology, Clinical Neurophysiology, Sleep Medicine, Pain Medicine and Neuroimaging  [facility] [address], [place], [place] [address] [phone]

## 2019-06-08 ENCOUNTER — Ambulatory Visit (INDEPENDENT_AMBULATORY_CARE_PROVIDER_SITE_OTHER): Payer: Medicare Other | Admitting: Internal Medicine

## 2019-06-08 ENCOUNTER — Other Ambulatory Visit: Payer: Self-pay

## 2019-06-08 ENCOUNTER — Encounter: Payer: Self-pay | Admitting: Internal Medicine

## 2019-06-08 ENCOUNTER — Other Ambulatory Visit (INDEPENDENT_AMBULATORY_CARE_PROVIDER_SITE_OTHER): Payer: Medicare Other

## 2019-06-08 VITALS — BP 124/84 | HR 102 | Temp 98.5°F | Ht 69.0 in | Wt 229.0 lb

## 2019-06-08 DIAGNOSIS — E538 Deficiency of other specified B group vitamins: Secondary | ICD-10-CM

## 2019-06-08 DIAGNOSIS — E559 Vitamin D deficiency, unspecified: Secondary | ICD-10-CM

## 2019-06-08 DIAGNOSIS — E611 Iron deficiency: Secondary | ICD-10-CM

## 2019-06-08 DIAGNOSIS — Z125 Encounter for screening for malignant neoplasm of prostate: Secondary | ICD-10-CM

## 2019-06-08 DIAGNOSIS — Z1159 Encounter for screening for other viral diseases: Secondary | ICD-10-CM

## 2019-06-08 DIAGNOSIS — IMO0002 Reserved for concepts with insufficient information to code with codable children: Secondary | ICD-10-CM

## 2019-06-08 DIAGNOSIS — E1142 Type 2 diabetes mellitus with diabetic polyneuropathy: Secondary | ICD-10-CM

## 2019-06-08 DIAGNOSIS — Z0000 Encounter for general adult medical examination without abnormal findings: Secondary | ICD-10-CM

## 2019-06-08 DIAGNOSIS — E1165 Type 2 diabetes mellitus with hyperglycemia: Secondary | ICD-10-CM

## 2019-06-08 LAB — CBC WITH DIFFERENTIAL/PLATELET
Basophils Absolute: 0.1 10*3/uL (ref 0.0–0.1)
Basophils Relative: 0.6 % (ref 0.0–3.0)
Eosinophils Absolute: 0.2 10*3/uL (ref 0.0–0.7)
Eosinophils Relative: 1.7 % (ref 0.0–5.0)
HCT: 46 % (ref 39.0–52.0)
Hemoglobin: 15.9 g/dL (ref 13.0–17.0)
Lymphocytes Relative: 40.6 % (ref 12.0–46.0)
Lymphs Abs: 4.4 10*3/uL — ABNORMAL HIGH (ref 0.7–4.0)
MCHC: 34.6 g/dL (ref 30.0–36.0)
MCV: 85 fl (ref 78.0–100.0)
Monocytes Absolute: 0.8 10*3/uL (ref 0.1–1.0)
Monocytes Relative: 7 % (ref 3.0–12.0)
Neutro Abs: 5.4 10*3/uL (ref 1.4–7.7)
Neutrophils Relative %: 50.1 % (ref 43.0–77.0)
Platelets: 208 10*3/uL (ref 150.0–400.0)
RBC: 5.41 Mil/uL (ref 4.22–5.81)
RDW: 13.7 % (ref 11.5–15.5)
WBC: 10.9 10*3/uL — ABNORMAL HIGH (ref 4.0–10.5)

## 2019-06-08 LAB — BASIC METABOLIC PANEL
BUN: 16 mg/dL (ref 6–23)
CO2: 32 mEq/L (ref 19–32)
Calcium: 9.7 mg/dL (ref 8.4–10.5)
Chloride: 97 mEq/L (ref 96–112)
Creatinine, Ser: 1.11 mg/dL (ref 0.40–1.50)
GFR: 66.31 mL/min (ref >60.00)
Glucose, Bld: 199 mg/dL — ABNORMAL HIGH (ref 70–99)
Potassium: 4.2 mEq/L (ref 3.5–5.1)
Sodium: 138 mEq/L (ref 135–145)

## 2019-06-08 LAB — LIPID PANEL
Cholesterol: 141 mg/dL (ref 0–200)
HDL: 45.3 mg/dL (ref >39.00)
NonHDL: 96.11
Total CHOL/HDL Ratio: 3
Triglycerides: 282 mg/dL — ABNORMAL HIGH (ref 0.0–149.0)
VLDL: 56.4 mg/dL — ABNORMAL HIGH (ref 0.0–40.0)

## 2019-06-08 LAB — IBC PANEL
Iron: 66 ug/dL (ref 42–165)
Saturation Ratios: 12.4 % — ABNORMAL LOW (ref 20.0–50.0)
Transferrin: 379 mg/dL — ABNORMAL HIGH (ref 212.0–360.0)

## 2019-06-08 LAB — HEPATIC FUNCTION PANEL
ALT: 19 U/L (ref 0–53)
AST: 19 U/L (ref 0–37)
Albumin: 4.4 g/dL (ref 3.5–5.2)
Alkaline Phosphatase: 87 U/L (ref 39–117)
Bilirubin, Direct: 0.2 mg/dL (ref 0.0–0.3)
Total Bilirubin: 0.7 mg/dL (ref 0.2–1.2)
Total Protein: 7.2 g/dL (ref 6.0–8.3)

## 2019-06-08 LAB — LDL CHOLESTEROL, DIRECT: Direct LDL: 57 mg/dL

## 2019-06-08 MED ORDER — LORAZEPAM 0.5 MG PO TABS: ORAL_TABLET | ORAL | 5 refills | Status: DC

## 2019-06-08 MED ORDER — TRULICITY 1.5 MG/0.5ML ~~LOC~~ SOAJ: SUBCUTANEOUS | 11 refills | Status: DC

## 2019-06-08 MED ORDER — FAMOTIDINE 40 MG PO TABS: ORAL_TABLET | ORAL | 3 refills | Status: DC

## 2019-06-08 MED ORDER — ROSUVASTATIN CALCIUM 20 MG PO TABS: ORAL_TABLET | ORAL | 3 refills | Status: DC

## 2019-06-08 MED ORDER — LINAGLIPTIN 5 MG PO TABS: ORAL_TABLET | ORAL | 3 refills | Status: DC

## 2019-06-08 MED ORDER — HYDROCHLOROTHIAZIDE 12.5 MG PO CAPS: ORAL_CAPSULE | ORAL | 3 refills | Status: DC

## 2019-06-08 NOTE — Progress Notes (Signed)
Subjective:    Patient ID: Frank [name], male    DOB: [birth year], 63 y.o.   MRN: [id number]  HPI  Here for wellness and f/u;  Overall doing ok;  Pt denies Chest pain, worsening SOB, DOE, wheezing, orthopnea, PND, worsening LE edema, palpitations, dizziness or syncope.  Pt denies neurological change such as new headache, facial or extremity weakness.  Pt denies polydipsia, polyuria, or low sugar symptoms. Pt states overall good compliance with treatment and medications, good tolerability, and has been trying to follow appropriate diet.  Pt denies worsening depressive symptoms, suicidal ideation or panic. No fever, night sweats, wt loss, loss of appetite, or other constitutional symptoms.  Pt states good ability with ADL's, has low fall risk, home safety reviewed and adequate, no other significant changes in hearing or vision, and only occasionally active with exercise.  No new complaints Past Medical History:  Diagnosis Date  . Abdominal pain, left lower quadrant 06/06/2010  . Abdominal pain, unspecified site 01/19/2009  . ADD 10/09/2008  . ALLERGIC RHINITIS 10/09/2008  . ANXIETY DEPRESSION 02/01/2008  . COLONIC POLYPS 02/01/2008  . DIABETES MELLITUS, TYPE II 05/20/2010  . DYSPNEA 03/12/2010  . ERECTILE DYSFUNCTION 10/09/2008  . ERECTILE DYSFUNCTION, ORGANIC 05/20/2010  . FOOT PAIN, LEFT 05/20/2010  . GERD 02/01/2008  . Guillain Barr syndrome ([place])   . HEMORRHOIDS 02/01/2008  . HIATAL HERNIA 02/01/2008  . HYPERLIPIDEMIA 10/09/2008  . HYPERTENSION 10/09/2008  . MORTON'S NEUROMA 05/20/2010  . Other specified forms of hearing loss 06/27/2009  . PERIPHERAL EDEMA 05/20/2010  . PERIPHERAL NEUROPATHY 05/20/2010  . SLEEP APNEA, OBSTRUCTIVE 02/01/2008  . Stricture and stenosis of esophagus 02/02/2008  . Type II or unspecified type diabetes mellitus without mention of complication, uncontrolled 11/14/2010  . WRIST PAIN, LEFT 12/05/2009   Past Surgical History:  Procedure Laterality Date  . CARPAL TUNNEL  RELEASE    . ESOPHAGEAL DILATION  july 2009  . ESOPHAGOGASTRODUODENOSCOPY N/A 06/27/2014   Procedure: ESOPHAGOGASTRODUODENOSCOPY (EGD);  Surgeon: Lafayette Dragon, MD;  Location: [facility];  Service: Endoscopy;  Laterality: N/A;  . EYE SURGERY     catract surgery on both eyes  . ROTATOR CUFF REPAIR      reports that he has never smoked. He has never used smokeless tobacco. He reports current alcohol use. He reports current drug use. Drug: Methaqualone. family history includes Depression in his mother; Diabetes in his brother and mother; Heart disease in his mother; Hyperlipidemia in his mother. Allergies  Allergen Reactions  . Codeine Itching    REACTION: Itching  . Influenza Vaccines     Due to hx of GBS  . Metformin And Related Other (See Comments)    GI upset, diarrhea  . Shellfish Allergy    Current Outpatient Medications on File Prior to Visit  Medication Sig Dispense Refill  . ARIPiprazole (ABILIFY) 10 MG tablet Take 1 tablet (10 mg total) by mouth daily. 90 tablet 3  . aspirin EC 81 MG tablet Take 1 tablet (81 mg total) by mouth daily.    . B-D ULTRAFINE III SHORT PEN 31G X 8 MM MISC USE AS DIRECTED 100 each 2  . cyclobenzaprine (FLEXERIL) 5 MG tablet TAKE 1 TABLET(5 MG) BY MOUTH AT BEDTIME 30 tablet 11  . DULoxetine (CYMBALTA) 60 MG capsule Take 1 capsule (60 mg total) by mouth 2 (two) times daily. 180 capsule 4  . glucose blood (ONE TOUCH ULTRA TEST) test strip USE AS DIRECTED 100 each 5  . Insulin Degludec (TRESIBA  FLEXTOUCH) 200 UNIT/ML SOPN Inject 90 Units into the skin daily. 6 pen 11  . Lancets (ONETOUCH DELICA PLUS Q000111Q) MISC USE AS DIRECTED 100 each 5  . methylphenidate (CONCERTA) 54 MG PO CR tablet Take 1 tablet (54 mg total) by mouth every morning. 30 tablet 0  . pregabalin (LYRICA) 200 MG capsule TAKE (1) CAPSULE TWO TIMES DAILY. 180 capsule 1  . tapentadol HCl (NUCYNTA) 75 MG tablet Take 1 tablet (75 mg total) by mouth 3 (three) times daily as needed. 90  tablet 0   No current facility-administered medications on file prior to visit.    Review of Systems Constitutional: Negative for other unusual diaphoresis, sweats, appetite or weight changes HENT: Negative for other worsening hearing loss, ear pain, facial swelling, mouth sores or neck stiffness.   Eyes: Negative for other worsening pain, redness or other visual disturbance.  Respiratory: Negative for other stridor or swelling Cardiovascular: Negative for other palpitations or other chest pain  Gastrointestinal: Negative for worsening diarrhea or loose stools, blood in stool, distention or other pain Genitourinary: Negative for hematuria, flank pain or other change in urine volume.  Musculoskeletal: Negative for myalgias or other joint swelling.  Skin: Negative for other color change, or other wound or worsening drainage.  Neurological: Negative for other syncope or numbness. Hematological: Negative for other adenopathy or swelling Psychiatric/Behavioral: Negative for hallucinations, other worsening agitation, SI, self-injury, or new decreased concentration All otherwise neg per pt    Objective:   Physical Exam BP 124/84   Pulse (!) 102   Temp 98.5 F (36.9 C) (Oral)   Ht 5\' 9"  (1.753 m)   Wt 229 lb (103.9 kg)   SpO2 98%   BMI 33.82 kg/m  VS noted,  Constitutional: Pt is oriented to person, place, and time. Appears well-developed and well-nourished, in no significant distress and comfortable Head: Normocephalic and atraumatic  Eyes: Conjunctivae and EOM are normal. Pupils are equal, round, and reactive to light Right Ear: External ear normal without discharge Left Ear: External ear normal without discharge Nose: Nose without discharge or deformity Mouth/Throat: Oropharynx is without other ulcerations and moist  Neck: Normal range of motion. Neck supple. No JVD present. No tracheal deviation present or significant neck LA or mass Cardiovascular: Normal rate, regular rhythm,  normal heart sounds and intact distal pulses.   Pulmonary/Chest: WOB normal and breath sounds without rales or wheezing  Abdominal: Soft. Bowel sounds are normal. NT. No HSM  Musculoskeletal: Normal range of motion. Exhibits no edema Lymphadenopathy: Has no other cervical adenopathy.  Neurological: Pt is alert and oriented to person, place, and time. Pt has normal reflexes. No cranial nerve deficit. Motor grossly intact, Gait intact Skin: Skin is warm and dry. No rash noted or new ulcerations Psychiatric:  Has normal mood and affect. Behavior is normal without agitation All otherwise neg per pt Lab Results  Component Value Date   WBC 9.5 11/16/2017   HGB 16.4 11/16/2017   HCT 46.9 11/16/2017   PLT 197.0 11/16/2017   GLUCOSE 196 (H) 03/07/2019   CHOL 110 04/14/2018   TRIG 212.0 (H) 04/14/2018   HDL 38.90 (L) 04/14/2018   LDLDIRECT 50.0 04/14/2018   LDLCALC 23 11/16/2017   ALT 17 04/14/2018   AST 16 04/14/2018   NA 138 03/07/2019   K 4.2 03/07/2019   CL 99 03/07/2019   CREATININE 1.12 03/07/2019   BUN 14 03/07/2019   CO2 30 03/07/2019   TSH 1.44 03/07/2019   PSA 0.36 11/16/2017  HGBA1C 8.6 (A) 04/20/2019   MICROALBUR <0.7 11/16/2017      Assessment & Plan:

## 2019-06-08 NOTE — Patient Instructions (Signed)

## 2019-06-08 NOTE — Assessment & Plan Note (Signed)

## 2019-06-08 NOTE — Assessment & Plan Note (Signed)
stable overall by history and exam, recent data reviewed with pt, and pt to continue medical treatment as before,  to f/u any worsening symptoms or concerns  

## 2019-06-09 LAB — HEPATITIS C ANTIBODY
Hepatitis C Ab: NONREACTIVE
SIGNAL TO CUT-OFF: 0.02 (ref <1.00)

## 2019-06-09 LAB — TSH: TSH: 2.84 u[IU]/mL (ref 0.35–4.50)

## 2019-06-09 LAB — VITAMIN D 25 HYDROXY (VIT D DEFICIENCY, FRACTURES): VITD: 13.58 ng/mL — ABNORMAL LOW (ref 30.00–100.00)

## 2019-06-09 LAB — VITAMIN B12: Vitamin B-12: 188 pg/mL — ABNORMAL LOW (ref 211–911)

## 2019-06-09 LAB — PSA: PSA: 0.3 ng/mL (ref 0.10–4.00)

## 2019-06-10 ENCOUNTER — Other Ambulatory Visit: Payer: Self-pay | Admitting: Internal Medicine

## 2019-06-10 ENCOUNTER — Encounter: Payer: Self-pay | Admitting: Internal Medicine

## 2019-06-10 MED ORDER — VITAMIN B-12 1000 MCG PO TABS: 1000.0000 ug | ORAL_TABLET | Freq: Every day | ORAL | 1 refills | Status: DC

## 2019-06-10 MED ORDER — VITAMIN D (ERGOCALCIFEROL) 1.25 MG (50000 UNIT) PO CAPS: 50000.0000 [IU] | ORAL_CAPSULE | ORAL | 0 refills | Status: DC

## 2019-06-20 ENCOUNTER — Other Ambulatory Visit: Payer: Self-pay

## 2019-06-20 ENCOUNTER — Encounter: Payer: Self-pay | Admitting: Endocrinology

## 2019-06-20 ENCOUNTER — Ambulatory Visit: Payer: Medicare Other | Admitting: Endocrinology

## 2019-06-20 VITALS — BP 160/88 | HR 111 | Ht 69.0 in | Wt 229.2 lb

## 2019-06-20 DIAGNOSIS — E1142 Type 2 diabetes mellitus with diabetic polyneuropathy: Secondary | ICD-10-CM

## 2019-06-20 DIAGNOSIS — E1165 Type 2 diabetes mellitus with hyperglycemia: Secondary | ICD-10-CM | POA: Diagnosis not present

## 2019-06-20 DIAGNOSIS — IMO0002 Reserved for concepts with insufficient information to code with codable children: Secondary | ICD-10-CM

## 2019-06-20 LAB — POCT GLYCOSYLATED HEMOGLOBIN (HGB A1C): Hemoglobin A1C: 8.6 % — AB (ref 4.0–5.6)

## 2019-06-20 MED ORDER — TRULICITY 3 MG/0.5ML ~~LOC~~ SOAJ: 3.0000 mg | SUBCUTANEOUS | 11 refills | Status: DC

## 2019-06-20 NOTE — Patient Instructions (Addendum)
Your blood pressure is high today.  Please see your primary care provider soon, to have it rechecked.  Please increase the Trulicity to 3 mg weekly.   continue the same other diabetes medications.  check your blood sugar once a day.  vary the time of day when you check, between before the 3 meals, and at bedtime.  also check if you have symptoms of your blood sugar being too high or too low.  please keep a record of the readings and bring it to your next appointment here (or you can bring the meter itself).  You can write it on any piece of paper.  please call us sooner if your blood sugar goes below 70, or if you have a lot of readings over 200.  Please come back for a follow-up appointment in 2 months.

## 2019-06-20 NOTE — Progress Notes (Signed)
   Subjective:    Patient ID: Frank [name], male    DOB: [birth year], 63 y.o.   MRN: [id number]  HPI Pt returns for f/u of diabetes mellitus: DM type: Insulin-requiring type 2 Dx'ed: 0000000 Complications: polyneuropathy and renal insuff.  Therapy: insulin since [id number], trulicity, and 2 oral meds DKA: never Severe hypoglycemia: never.   Pancreatitis: never Pancreatic imaging: normal on 2006 CT.   Other: he did not tolerate metformin-XR (GI sxs); he takes QD insulin, due to noncompliance; pt injects his own insulin.   Interval history: He brings a record of his cbg's which I have reviewed today.  cbg's vary from 151-350.  There is no trend throughout the day.  he says he never misses meds.  pt states he feels well in general.     Review of Systems Denies nausea. He denies hypoglycemia.     Objective:   Physical Exam VITAL SIGNS:  See vs page GENERAL: no distress Pulses: dorsalis pedis intact bilat.   MSK: no deformity of the feet CV: no leg edema Skin:  no ulcer on the feet.  normal color and temp on the feet. Neuro: sensation is intact to touch on the feet  Lab Results  Component Value Date   HGBA1C 8.6 (A) 06/20/2019   Lab Results  Component Value Date   TSH 2.84 06/08/2019   Lab Results  Component Value Date   CREATININE 1.11 06/08/2019   BUN 16 06/08/2019   NA 138 06/08/2019   K 4.2 06/08/2019   CL 97 06/08/2019   CO2 32 06/08/2019        Assessment & Plan:  HTN: is noted today Insulin-requiring type 2 DM, with PN: he needs increased rx.    Patient Instructions  Your blood pressure is high today.  Please see your primary care provider soon, to have it rechecked.  Please increase the Trulicity to 3 mg weekly.   continue the same other diabetes medications.  check your blood sugar once a day.  vary the time of day when you check, between before the 3 meals, and at bedtime.  also check if you have symptoms of your blood sugar being too high or too low.   please keep a record of the readings and bring it to your next appointment here (or you can bring the meter itself).  You can write it on any piece of paper.  please call us sooner if your blood sugar goes below 70, or if you have a lot of readings over 200.  Please come back for a follow-up appointment in 2 months.

## 2019-06-23 ENCOUNTER — Telehealth: Payer: Self-pay | Admitting: Internal Medicine

## 2019-06-23 NOTE — Telephone Encounter (Signed)
No  Need for refill as last rx clearly stated his next refill would be nov 10 and sent already in last oct 2020

## 2019-06-23 NOTE — Telephone Encounter (Signed)
Patient requesting LORazepam (ATIVAN) 0.5 MG tablet, informed please allow 48 to 72 hour turn around time  [place], [place] [facility] [employer]

## 2019-06-28 ENCOUNTER — Telehealth: Payer: Self-pay

## 2019-06-28 NOTE — Telephone Encounter (Signed)
We have attempted to call the patient two times to schedule sleep study.  Patient has been unavailable at the phone numbers we have on file and has not returned our calls. If patient calls back we will schedule them for their sleep study.  

## 2019-07-05 ENCOUNTER — Encounter: Payer: Self-pay | Admitting: Gastroenterology

## 2019-07-06 ENCOUNTER — Encounter: Payer: Self-pay | Admitting: Gastroenterology

## 2019-07-14 ENCOUNTER — Other Ambulatory Visit: Payer: Self-pay | Admitting: Neurology

## 2019-07-14 NOTE — Telephone Encounter (Signed)
Pt is needing a refill on his tapentadol HCl (NUCYNTA) 75 MG tablet and his pregabalin (LYRICA) 200 MG capsule sent in to the [facility] on [address]

## 2019-07-15 MED ORDER — TAPENTADOL HCL 75 MG PO TABS: 75.0000 mg | ORAL_TABLET | Freq: Three times a day (TID) | ORAL | 0 refills | Status: DC | PRN

## 2019-07-15 MED ORDER — PREGABALIN 200 MG PO CAPS: ORAL_CAPSULE | ORAL | 1 refills | Status: DC

## 2019-07-18 ENCOUNTER — Other Ambulatory Visit: Payer: Self-pay | Admitting: Neurology

## 2019-08-08 ENCOUNTER — Telehealth: Payer: Self-pay | Admitting: *Deleted

## 2019-08-08 ENCOUNTER — Other Ambulatory Visit: Payer: Self-pay

## 2019-08-08 ENCOUNTER — Ambulatory Visit (AMBULATORY_SURGERY_CENTER): Payer: Self-pay | Admitting: *Deleted

## 2019-08-08 VITALS — Temp 97.5°F | Ht 69.0 in | Wt 227.0 lb

## 2019-08-08 DIAGNOSIS — Z86010 Personal history of colonic polyps: Secondary | ICD-10-CM

## 2019-08-08 DIAGNOSIS — Z1159 Encounter for screening for other viral diseases: Secondary | ICD-10-CM

## 2019-08-08 MED ORDER — SUPREP BOWEL PREP KIT 17.5-3.13-1.6 GM/177ML PO SOLN: 1.0000 | Freq: Once | ORAL | 0 refills | Status: AC

## 2019-08-08 NOTE — Addendum Note (Signed)
Addended by: Steva Ready on: 08/08/2019 03:19 PM   Modules accepted: Orders

## 2019-08-08 NOTE — Telephone Encounter (Signed)
Dr Loletha Carrow,  I saw this pt in [place] today- he was diagnosed in 06-2014 with Guillain-Barre after a flu vaccine- he was in ICU for a stent afterwards per pt and husband.  His last colon was 08-2013 with 2 TA polyps-    Is he okay to proceed with his colon as scheduled or do you need to see him in the office prior?  Thanks for your time, Lelan Pons

## 2019-08-08 NOTE — Progress Notes (Signed)
Husbands temp 96.9- in PV  No egg or soy allergy known to patient  No issues with past sedation with any surgeries  or procedures, no intubation problems  No diet pills per patient No home 02 use per patient  No blood thinners per patient  Pt states  issues with constipation - all the time per pt and CP-husband- has a BM once a week- takes daily stool softener- soft per pt but only once a week - will do a 2 day Suprep due to these issues -  No A fib or A flutter  EMMI video sent to pt's e mail   Due to the COVID-19 pandemic we are asking patients to follow these guidelines. Please only bring one care partner. Please be aware that your care partner may wait in the car in the parking lot or if they feel like they will be too hot to wait in the car, they may wait in the lobby on the 4th floor. All care partners are required to wear a mask the entire time (we do not have any that we can provide them), they need to practice social distancing, and we will do a Covid check for all patient's and care partners when you arrive. Also we will check their temperature and your temperature. If the care partner waits in their car they need to stay in the parking lot the entire time and we will call them on their cell phone when the patient is ready for discharge so they can bring the car to the front of the building. Also all patient's will need to wear a mask into building.

## 2019-08-15 NOTE — Telephone Encounter (Signed)
Thanks for checking.  I reviewed this patient's most recent primary care clinic note from 06/08/19, indicating that he has good functional status.  He can have his procedure in the [facility] as scheduled.

## 2019-08-17 ENCOUNTER — Encounter: Payer: Self-pay | Admitting: Gastroenterology

## 2019-08-17 ENCOUNTER — Encounter: Payer: Medicare Other | Admitting: Gastroenterology

## 2019-08-18 ENCOUNTER — Other Ambulatory Visit: Payer: Self-pay

## 2019-08-22 ENCOUNTER — Ambulatory Visit (INDEPENDENT_AMBULATORY_CARE_PROVIDER_SITE_OTHER): Payer: Medicare PPO

## 2019-08-22 ENCOUNTER — Encounter: Payer: Self-pay | Admitting: Endocrinology

## 2019-08-22 ENCOUNTER — Other Ambulatory Visit: Payer: Self-pay | Admitting: Neurology

## 2019-08-22 ENCOUNTER — Ambulatory Visit: Payer: Medicare Other | Admitting: Endocrinology

## 2019-08-22 ENCOUNTER — Other Ambulatory Visit: Payer: Self-pay | Admitting: Gastroenterology

## 2019-08-22 VITALS — BP 150/80 | HR 113 | Ht 69.0 in | Wt 228.6 lb

## 2019-08-22 DIAGNOSIS — Z1159 Encounter for screening for other viral diseases: Secondary | ICD-10-CM | POA: Diagnosis not present

## 2019-08-22 DIAGNOSIS — E1165 Type 2 diabetes mellitus with hyperglycemia: Secondary | ICD-10-CM | POA: Diagnosis not present

## 2019-08-22 DIAGNOSIS — E1142 Type 2 diabetes mellitus with diabetic polyneuropathy: Secondary | ICD-10-CM

## 2019-08-22 DIAGNOSIS — IMO0002 Reserved for concepts with insufficient information to code with codable children: Secondary | ICD-10-CM

## 2019-08-22 LAB — POCT GLYCOSYLATED HEMOGLOBIN (HGB A1C): Hemoglobin A1C: 8.5 % — AB (ref 4.0–5.6)

## 2019-08-22 MED ORDER — TRULICITY 4.5 MG/0.5ML ~~LOC~~ SOAJ: 4.5000 mg | SUBCUTANEOUS | 3 refills | Status: DC

## 2019-08-22 NOTE — Progress Notes (Signed)
Subjective:    Patient ID: Frank [name], male    DOB: Nov 23, 1955, 64 y.o.   MRN: [id number]  HPI Pt returns for f/u of diabetes mellitus: DM type: Insulin-requiring type 2 Dx'ed: 0000000 Complications: polyneuropathy and renal insuff.  Therapy: insulin since XX123456, trulicity, and tradjenta.   DKA: never Severe hypoglycemia: never.   Pancreatitis: never Pancreatic imaging: normal on 2006 CT.   Other: he did not tolerate metformin-XR (GI sxs); he takes QD insulin, due to noncompliance; pt injects his own insulin.   Interval history: He brings a record of his cbg's which I have reviewed today.  cbg's vary from 119-304.  There is no trend throughout the day.  he says he has missed meds only once since last ov.  pt states he feels well in general.   Past Medical History:  Diagnosis Date  . Abdominal pain, left lower quadrant 06/06/2010  . Abdominal pain, unspecified site 01/19/2009  . ADD 10/09/2008  . ALLERGIC RHINITIS 10/09/2008  . Allergy   . ANXIETY DEPRESSION 02/01/2008  . Arthritis    hands ?  . Blood transfusion without reported diagnosis   . Cataract    removed both eyes   . COLONIC POLYPS 02/01/2008  . DIABETES MELLITUS, TYPE II 05/20/2010  . DYSPNEA 03/12/2010  . ERECTILE DYSFUNCTION 10/09/2008  . ERECTILE DYSFUNCTION, ORGANIC 05/20/2010  . FOOT PAIN, LEFT 05/20/2010  . GERD 02/01/2008  . Guillain Barr syndrome ([place])   . HEMORRHOIDS 02/01/2008  . HIATAL HERNIA 02/01/2008  . HYPERLIPIDEMIA 10/09/2008  . HYPERTENSION 10/09/2008  . MORTON'S NEUROMA 05/20/2010  . Other specified forms of hearing loss 06/27/2009  . PERIPHERAL EDEMA 05/20/2010  . PERIPHERAL NEUROPATHY 05/20/2010  . Sleep apnea    stopped cpap   . SLEEP APNEA, OBSTRUCTIVE 02/01/2008  . Stricture and stenosis of esophagus 02/02/2008  . Type II or unspecified type diabetes mellitus without mention of complication, uncontrolled 11/14/2010  . WRIST PAIN, LEFT 12/05/2009    Past Surgical History:  Procedure Laterality  Date  . CARPAL TUNNEL RELEASE Bilateral   . COLONOSCOPY    . DENTAL SURGERY    . ESOPHAGEAL DILATION  july 2009  . ESOPHAGOGASTRODUODENOSCOPY N/A 06/27/2014   Procedure: ESOPHAGOGASTRODUODENOSCOPY (EGD);  Surgeon: Lafayette Dragon, MD;  Location: [facility];  Service: Endoscopy;  Laterality: N/A;  . EYE SURGERY     catract surgery on both eyes  . POLYPECTOMY    . ROTATOR CUFF REPAIR    . UPPER GASTROINTESTINAL ENDOSCOPY      Social History   Socioeconomic History  . Marital status: Single    Spouse name: Not on file  . Number of children: Not on file  . Years of education: Not on file  . Highest education level: Not on file  Occupational History  . Occupation: Musician: [employer]  Tobacco Use  . Smoking status: Never Smoker  . Smokeless tobacco: Never Used  Substance and Sexual Activity  . Alcohol use: Yes    Comment: occasional  . Drug use: Yes    Types: Methaqualone  . Sexual activity: Yes    Comment: before I got sick  Other Topics Concern  . Not on file  Social History Narrative  . Not on file   Social Determinants of Health   Financial Resource Strain:   . Difficulty of Paying Living Expenses: Not on file  Food Insecurity:   . Worried About Charity fundraiser in the Last Year: Not  on file  . Ran Out of Food in the Last Year: Not on file  Transportation Needs:   . Lack of Transportation (Medical): Not on file  . Lack of Transportation (Non-Medical): Not on file  Physical Activity:   . Days of Exercise per Week: Not on file  . Minutes of Exercise per Session: Not on file  Stress:   . Feeling of Stress : Not on file  Social Connections:   . Frequency of Communication with Friends and Family: Not on file  . Frequency of Social Gatherings with Friends and Family: Not on file  . Attends Religious Services: Not on file  . Active Member of Clubs or Organizations: Not on file  . Attends Archivist Meetings: Not on file  .  Marital Status: Not on file  Intimate Partner Violence:   . Fear of Current or Ex-Partner: Not on file  . Emotionally Abused: Not on file  . Physically Abused: Not on file  . Sexually Abused: Not on file    Current Outpatient Medications on File Prior to Visit  Medication Sig Dispense Refill  . ARIPiprazole (ABILIFY) 10 MG tablet Take 1 tablet (10 mg total) by mouth daily. 90 tablet 3  . aspirin EC 81 MG tablet Take 1 tablet (81 mg total) by mouth daily.    . B-D ULTRAFINE III SHORT PEN 31G X 8 MM MISC USE AS DIRECTED 100 each 2  . cyclobenzaprine (FLEXERIL) 5 MG tablet TAKE 1 TABLET(5 MG) BY MOUTH AT BEDTIME 30 tablet 11  . docusate calcium (SURFAK) 240 MG capsule Take 240 mg by mouth daily.    . DULoxetine (CYMBALTA) 60 MG capsule TAKE 1 CAPSULE BY MOUTH TWICE DAILY 180 capsule 4  . famotidine (PEPCID) 40 MG tablet TAKE 1 TABLET(40 MG) BY MOUTH TWICE DAILY 180 tablet 3  . glucose blood (ONE TOUCH ULTRA TEST) test strip USE AS DIRECTED 100 each 5  . hydrochlorothiazide (MICROZIDE) 12.5 MG capsule TAKE 1 CAPSULE(12.5 MG) BY MOUTH DAILY 90 capsule 3  . Insulin Degludec (TRESIBA FLEXTOUCH) 200 UNIT/ML SOPN Inject 90 Units into the skin daily. 6 pen 11  . Lancets (ONETOUCH DELICA PLUS Q000111Q) MISC USE AS DIRECTED 100 each 5  . LORazepam (ATIVAN) 0.5 MG tablet TAKE 1 TABLET(0.5 MG) BY MOUTH TWICE DAILY 60 tablet 5  . methylphenidate (CONCERTA) 54 MG PO CR tablet Take 1 tablet (54 mg total) by mouth every morning. 30 tablet 0  . omeprazole (PRILOSEC OTC) 20 MG tablet Take 20 mg by mouth daily.    . pregabalin (LYRICA) 200 MG capsule TAKE (1) CAPSULE TWO TIMES DAILY. 180 capsule 1  . rosuvastatin (CRESTOR) 20 MG tablet TAKE 1 TABLET(20 MG) BY MOUTH DAILY 90 tablet 3  . tapentadol HCl (NUCYNTA) 75 MG tablet Take 1 tablet (75 mg total) by mouth 3 (three) times daily as needed. 90 tablet 0  . vitamin B-12 (CYANOCOBALAMIN) 1000 MCG tablet Take 1 tablet (1,000 mcg total) by mouth daily. 90 tablet  1  . Vitamin D, Ergocalciferol, (DRISDOL) 1.25 MG (50000 UT) CAPS capsule Take 1 capsule (50,000 Units total) by mouth every 7 (seven) days. 12 capsule 0   No current facility-administered medications on file prior to visit.    Allergies  Allergen Reactions  . Codeine Itching    REACTION: Itching  . Influenza Vaccines     Due to hx of GBS  . Metformin And Related Other (See Comments)    GI upset, diarrhea  . Other  Other (See Comments)    Due to hx of GBS- vaccines   . Shellfish Allergy     Family History  Problem Relation Age of Onset  . Diabetes Mother   . Heart disease Mother   . Hyperlipidemia Mother   . Depression Mother   . Diabetes Brother   . Colon cancer Neg Hx   . Colon polyps Neg Hx   . Esophageal cancer Neg Hx   . Rectal cancer Neg Hx   . Stomach cancer Neg Hx     BP (!) 150/80 (BP Location: Right Arm, Patient Position: Sitting, Cuff Size: Large)   Pulse (!) 113   Ht 5\' 9"  (1.753 m)   Wt 228 lb 9.6 oz (103.7 kg)   SpO2 96%   BMI 33.76 kg/m    Review of Systems He denies hypoglycemia and nausea.      Objective:   Physical Exam VITAL SIGNS:  See vs page GENERAL: no distress Pulses: dorsalis pedis intact bilat.   MSK: no deformity of the feet CV: no leg edema Skin:  no ulcer on the feet.  normal color and temp on the feet. Neuro: sensation is intact to touch on the feet Ext: there is bilateral onychomycosis of the toenails  Lab Results  Component Value Date   TSH 2.84 06/08/2019   Lab Results  Component Value Date   HGBA1C 8.5 (A) 08/22/2019   Lab Results  Component Value Date   CREATININE 1.11 06/08/2019   BUN 16 06/08/2019   NA 138 06/08/2019   K 4.2 06/08/2019   CL 97 06/08/2019   CO2 32 06/08/2019       Assessment & Plan:  Tachycardia: as TSH is normal, he should f/u this with PCP HTN: is noted today Insulin-requiring type 2 DM, with PN: he needs increased rx   Patient Instructions  Your blood pressure is high today.   Please see your primary care provider soon, to have it rechecked.  Please increase the Trulicity to 4.5 mg weekly.  You can use up the 3 mg pens by taking 1 every 5 days, and: Please stop taking the trulicity, and: continue the same tresiba. check your blood sugar once a day.  vary the time of day when you check, between before the 3 meals, and at bedtime.  also check if you have symptoms of your blood sugar being too high or too low.  please keep a record of the readings and bring it to your next appointment here (or you can bring the meter itself).  You can write it on any piece of paper.  please call us sooner if your blood sugar goes below 70, or if you have a lot of readings over 200.  Please come back for a follow-up appointment in 2 months.

## 2019-08-22 NOTE — Patient Instructions (Addendum)
Your blood pressure is high today.  Please see your primary care provider soon, to have it rechecked.  Please increase the Trulicity to 4.5 mg weekly.  You can use up the 3 mg pens by taking 1 every 5 days, and: Please stop taking the trulicity, and: continue the same tresiba. check your blood sugar once a day.  vary the time of day when you check, between before the 3 meals, and at bedtime.  also check if you have symptoms of your blood sugar being too high or too low.  please keep a record of the readings and bring it to your next appointment here (or you can bring the meter itself).  You can write it on any piece of paper.  please call us sooner if your blood sugar goes below 70, or if you have a lot of readings over 200.  Please come back for a follow-up appointment in 2 months.

## 2019-08-23 LAB — SARS CORONAVIRUS 2 (TAT 6-24 HRS): SARS Coronavirus 2: NEGATIVE

## 2019-08-24 ENCOUNTER — Ambulatory Visit (AMBULATORY_SURGERY_CENTER): Payer: Medicare PPO | Admitting: Gastroenterology

## 2019-08-24 ENCOUNTER — Other Ambulatory Visit: Payer: Self-pay

## 2019-08-24 ENCOUNTER — Encounter: Payer: Self-pay | Admitting: Gastroenterology

## 2019-08-24 VITALS — BP 112/71 | HR 83 | Temp 95.9°F | Resp 9 | Ht 69.0 in | Wt 227.0 lb

## 2019-08-24 DIAGNOSIS — D124 Benign neoplasm of descending colon: Secondary | ICD-10-CM | POA: Diagnosis not present

## 2019-08-24 DIAGNOSIS — D120 Benign neoplasm of cecum: Secondary | ICD-10-CM

## 2019-08-24 DIAGNOSIS — D123 Benign neoplasm of transverse colon: Secondary | ICD-10-CM | POA: Diagnosis not present

## 2019-08-24 DIAGNOSIS — Z86010 Personal history of colonic polyps: Secondary | ICD-10-CM | POA: Diagnosis not present

## 2019-08-24 DIAGNOSIS — D122 Benign neoplasm of ascending colon: Secondary | ICD-10-CM

## 2019-08-24 MED ORDER — DEXTROSE 50 % IV SOLN
12.5000 g | Freq: Once | INTRAVENOUS | Status: AC
  Administered 2019-08-24: 08:00:00 12.5 g via INTRAVENOUS

## 2019-08-24 MED ORDER — SODIUM CHLORIDE 0.9 % IV SOLN: 500.0000 mL | Freq: Once | INTRAVENOUS | Status: DC

## 2019-08-24 MED ORDER — DEXTROSE 5 % IV SOLN: INTRAVENOUS | Status: DC

## 2019-08-24 NOTE — Progress Notes (Signed)
Pt presented with CBG of 58. Reported feeling shaky and foggy. D5W started via IV infusion per standing orders. Dr. Loletha Carrow made aware. Orders received to give 12.5g of D50 injection. During IV push of D50, CBG was rechecked and was 182. Dr. Loletha Carrow and CRNA aware. Pt taken for procedure. Will recheck CBG in recovery.

## 2019-08-24 NOTE — Progress Notes (Signed)
Temp-JB VS-CW  Pt's states no medical or surgical changes since previsit or office visit.  

## 2019-08-24 NOTE — Patient Instructions (Signed)
Handouts given;  Polyps Resume previous diet Continue present medications Await pathology results for 9 polyps     YOU HAD AN ENDOSCOPIC PROCEDURE TODAY AT [place]:   Refer to the procedure report that was given to you for any specific questions about what was found during the examination.  If the procedure report does not answer your questions, please call your gastroenterologist to clarify.  If you requested that your care partner not be given the details of your procedure findings, then the procedure report has been included in a sealed envelope for you to review at your convenience later.  YOU SHOULD EXPECT: Some feelings of bloating in the abdomen. Passage of more gas than usual.  Walking can help get rid of the air that was put into your GI tract during the procedure and reduce the bloating. If you had a lower endoscopy (such as a colonoscopy or flexible sigmoidoscopy) you may notice spotting of blood in your stool or on the toilet paper. If you underwent a bowel prep for your procedure, you may not have a normal bowel movement for a few days.  Please Note:  You might notice some irritation and congestion in your nose or some drainage.  This is from the oxygen used during your procedure.  There is no need for concern and it should clear up in a day or so.  SYMPTOMS TO REPORT IMMEDIATELY:   Following lower endoscopy (colonoscopy or flexible sigmoidoscopy):  Excessive amounts of blood in the stool  Significant tenderness or worsening of abdominal pains  Swelling of the abdomen that is new, acute  Fever of 100F or higher  For urgent or emergent issues, a gastroenterologist can be reached at any hour by calling [phone].   DIET:  We do recommend a small meal at first, but then you may proceed to your regular diet.  Drink plenty of fluids but you should avoid alcoholic beverages for 24 hours.  ACTIVITY:  You should plan to take it easy for the rest of today  and you should NOT DRIVE or use heavy machinery until tomorrow (because of the sedation medicines used during the test).    FOLLOW UP: Our staff will call the number listed on your records 48-72 hours following your procedure to check on you and address any questions or concerns that you may have regarding the information given to you following your procedure. If we do not reach you, we will leave a message.  We will attempt to reach you two times.  During this call, we will ask if you have developed any symptoms of COVID 19. If you develop any symptoms (ie: fever, flu-like symptoms, shortness of breath, cough etc.) before then, please call [phone].  If you test positive for Covid 19 in the 2 weeks post procedure, please call and report this information to [place].    If any biopsies were taken you will be contacted by phone or by letter within the next 1-3 weeks.  Please call us at [phone] if you have not heard about the biopsies in 3 weeks.    SIGNATURES/CONFIDENTIALITY: You and/or your care partner have signed paperwork which will be entered into your electronic medical record.  These signatures attest to the fact that that the information above on your After Visit Summary has been reviewed and is understood.  Full responsibility of the confidentiality of this discharge information lies with you and/or your care-partner.

## 2019-08-24 NOTE — Progress Notes (Signed)
PT taken to PACU. Monitors in place. VSS. Report given to RN. 

## 2019-08-24 NOTE — Op Note (Signed)
[facility] Patient Name: Frank [name] Procedure Date: 08/24/2019 7:51 AM MRN: [id number] Endoscopist: Mallie Mussel L. Loletha Carrow , MD Age: 65 Referring MD:  Date of Birth: February 05, 1956 Gender: Male Account #: [id number] Procedure:                Colonoscopy Indications:              Surveillance: Personal history of adenomatous                            polyps on last colonoscopy > 5 years ago (59mm and                            37mm TAs 08/2013) Medicines:                Monitored Anesthesia Care Procedure:                Pre-Anesthesia Assessment:                           - Prior to the procedure, a History and Physical                            was performed, and patient medications and                            allergies were reviewed. The patient's tolerance of                            previous anesthesia was also reviewed. The risks                            and benefits of the procedure and the sedation                            options and risks were discussed with the patient.                            All questions were answered, and informed consent                            was obtained. Prior Anticoagulants: The patient has                            taken no previous anticoagulant or antiplatelet                            agents except for aspirin. ASA Grade Assessment:                            III - A patient with severe systemic disease. After                            reviewing the risks and benefits, the patient was  deemed in satisfactory condition to undergo the                            procedure.                           After obtaining informed consent, the colonoscope                            was passed under direct vision. Throughout the                            procedure, the patient's blood pressure, pulse, and                            oxygen saturations were monitored continuously. The   Colonoscope was introduced through the anus and                            advanced to the the cecum, identified by                            appendiceal orifice and ileocecal valve. The                            colonoscopy was somewhat difficult due to a                            redundant colon. Successful completion of the                            procedure was aided by using manual pressure. The                            patient tolerated the procedure fairly well. The                            quality of the bowel preparation was good after                            lavage. The ileocecal valve, appendiceal orifice,                            and rectum were photographed. The bowel preparation                            used was SUPREP. Scope In: 8:23:00 AM Scope Out: 9:04:47 AM Scope Withdrawal Time: 0 hours 36 minutes 35 seconds  Total Procedure Duration: 0 hours 41 minutes 47 seconds  Findings:                 The perianal and digital rectal examinations were                            normal.  There was a lipoma in the mid ascending colon.                           Four sessile polyps were found in the ascending                            colon and ileocecal valve. The polyps were 4 to 6                            mm in size. These polyps were removed with a cold                            snare. Resection and retrieval were complete.                           Three sessile polyps were found in the proximal                            transverse colon and distal ascending colon. The                            polyps were 4 to 6 mm in size. These polyps were                            removed with a cold snare. Resection and retrieval                            were complete.                           Two semi-pedunculated polyps were found in the                            descending colon. The polyps were 4 to 5 mm in                             size. These polyps were removed with a cold snare.                            Resection and retrieval were complete.                           The exam was otherwise without abnormality on                            direct and retroflexion views. Complications:            No immediate complications. Estimated Blood Loss:     Estimated blood loss was minimal. Impression:               - Lipoma in the mid ascending colon.                           - Four  4 to 6 mm polyps in the ascending colon and                            at the ileocecal valve, removed with a cold snare.                            Resected and retrieved.                           - Three 4 to 6 mm polyps in the proximal transverse                            colon and in the distal ascending colon, removed                            with a cold snare. Resected and retrieved.                           - Two 4 to 5 mm polyps in the descending colon,                            removed with a cold snare. Resected and retrieved.                           - The examination was otherwise normal on direct                            and retroflexion views. Recommendation:           - Patient has a contact number available for                            emergencies. The signs and symptoms of potential                            delayed complications were discussed with the                            patient. Return to normal activities tomorrow.                            Written discharge instructions were provided to the                            patient.                           - Resume previous diet.                           - Continue present medications.                           - Await pathology results.                           -  Repeat colonoscopy is recommended for                            surveillance. The colonoscopy date will be                            determined after pathology results from today's                             exam become available for review. [name], MD 08/24/2019 9:10:03 AM This report has been signed electronically.

## 2019-08-24 NOTE — Progress Notes (Signed)
Called to room to assist during endoscopic procedure.  Patient ID and intended procedure confirmed with present staff. Received instructions for my participation in the procedure from the performing physician.  

## 2019-08-26 ENCOUNTER — Telehealth: Payer: Self-pay

## 2019-08-26 NOTE — Telephone Encounter (Signed)
  Follow up Call-  Call back number 08/24/2019  Post procedure Call Back phone  # [phone]  Permission to leave phone message Yes  Some recent data might be hidden     Patient questions:  Do you have a fever, pain , or abdominal swelling? No. Pain Score  0 *  Have you tolerated food without any problems? Yes.    Have you been able to return to your normal activities? Yes.    Do you have any questions about your discharge instructions: Diet   No. Medications  No. Follow up visit  No.  Do you have questions or concerns about your Care? No.  Actions: * If pain score is 4 or above: No action needed, pain <4.  1. Have you developed a fever since your procedure? no  2.   Have you had an respiratory symptoms (SOB or cough) since your procedure? no  3.   Have you tested positive for COVID 19 since your procedure no  4.   Have you had any family members/close contacts diagnosed with the COVID 19 since your procedure?  no   If yes to any of these questions please route to Joylene John, RN and Alphonsa Gin, Therapist, sports.

## 2019-08-30 ENCOUNTER — Other Ambulatory Visit: Payer: Self-pay | Admitting: Neurology

## 2019-08-30 ENCOUNTER — Encounter: Payer: Self-pay | Admitting: Gastroenterology

## 2019-09-05 ENCOUNTER — Other Ambulatory Visit: Payer: Self-pay | Admitting: Neurology

## 2019-09-05 MED ORDER — TAPENTADOL HCL 75 MG PO TABS: 75.0000 mg | ORAL_TABLET | Freq: Three times a day (TID) | ORAL | 0 refills | Status: DC | PRN

## 2019-09-05 NOTE — Addendum Note (Signed)
Addended by: Hope Pigeon on: 09/05/2019 09:36 AM   Modules accepted: Orders

## 2019-09-05 NOTE — Telephone Encounter (Signed)
Pt has called for a refill on his tapentadol HCl (NUCYNTA) 75 MG tablet & ARIPiprazole (ABILIFY) 10 MG tablet  [facility] [phone]

## 2019-09-05 NOTE — Telephone Encounter (Signed)
Abilify last refilled 10/19/2018 #90, 3 refills. Too soon for refill on this.  Checked drug registry. He last refilled rx Nucynta 07/16/2019 #90. Last esen 06/06/19 and next f/u 10/03/19

## 2019-09-07 ENCOUNTER — Other Ambulatory Visit: Payer: Self-pay | Admitting: Neurology

## 2019-09-17 ENCOUNTER — Other Ambulatory Visit: Payer: Self-pay | Admitting: Internal Medicine

## 2019-09-17 NOTE — Telephone Encounter (Signed)
No need further high dose VIt D   Please change to OTC Vitamin D3 at 2000 units per day, indefinitely.

## 2019-09-19 ENCOUNTER — Telehealth: Payer: Self-pay

## 2019-09-19 NOTE — Telephone Encounter (Signed)
Although the chance of his rare reaction happening again to a different vaccine is very low, there is no evidence that this would be safe  My advice would be to Not take the Vaccine, due to the general advice given by the CDC to avoid the vaccines with a history of severe reactions in the past (they are referring mostly to overt allergic reactions such as anaphylaxis, but I would add this type of reaction as well)

## 2019-09-19 NOTE — Telephone Encounter (Signed)
Patient's husband, [name], calling and states that they would like to know if it is safe for him to get the COVID vaccine. States that years ago, he received the flu shot and then was diagnosed with Guillain-Barr syndrome as a cause from the flu shot. Please advise.  CB#: [phone]

## 2019-09-19 NOTE — Telephone Encounter (Signed)
No need for high dose.Marland Kitchen Please have the patient to take Vitamin D3 2000 units per day indefinitely

## 2019-09-20 NOTE — Telephone Encounter (Signed)
Pt's son has been informed.

## 2019-10-03 ENCOUNTER — Ambulatory Visit: Payer: Medicare Other | Admitting: Family Medicine

## 2019-10-14 ENCOUNTER — Other Ambulatory Visit: Payer: Self-pay | Admitting: Endocrinology

## 2019-10-20 ENCOUNTER — Other Ambulatory Visit: Payer: Self-pay

## 2019-10-20 ENCOUNTER — Telehealth: Payer: Self-pay

## 2019-10-20 MED ORDER — GLUCOSE BLOOD VI STRP: ORAL_STRIP | 5 refills | Status: DC

## 2019-10-20 NOTE — Telephone Encounter (Signed)
  [employer] is given him a different brand one touch test strips  1.Medication Requested:glucose blood (ONE TOUCH ULTRA TEST) test strip  2. Pharmacy (Name, [address], [place], [place]  3. On Med List: Yes   4. Last Visit with PCP: 10.28.20  5. Next visit date with PCP: 11.13.2021    Agent: Please be advised that RX refills may take up to 3 business days. We ask that you follow-up with your pharmacy.

## 2019-10-24 ENCOUNTER — Other Ambulatory Visit: Payer: Self-pay | Admitting: Internal Medicine

## 2019-10-24 ENCOUNTER — Other Ambulatory Visit: Payer: Self-pay

## 2019-10-24 ENCOUNTER — Encounter: Payer: Self-pay | Admitting: Endocrinology

## 2019-10-24 ENCOUNTER — Ambulatory Visit: Payer: Medicare PPO | Admitting: Endocrinology

## 2019-10-24 VITALS — BP 136/80 | HR 114 | Ht 69.0 in | Wt 230.0 lb

## 2019-10-24 DIAGNOSIS — E1142 Type 2 diabetes mellitus with diabetic polyneuropathy: Secondary | ICD-10-CM | POA: Diagnosis not present

## 2019-10-24 DIAGNOSIS — IMO0002 Reserved for concepts with insufficient information to code with codable children: Secondary | ICD-10-CM

## 2019-10-24 DIAGNOSIS — E1165 Type 2 diabetes mellitus with hyperglycemia: Secondary | ICD-10-CM | POA: Diagnosis not present

## 2019-10-24 LAB — POCT GLYCOSYLATED HEMOGLOBIN (HGB A1C): Hemoglobin A1C: 9.2 % — AB (ref 4.0–5.6)

## 2019-10-24 MED ORDER — TRULICITY 1.5 MG/0.5ML ~~LOC~~ SOAJ: 1.5000 mg | SUBCUTANEOUS | 11 refills | Status: DC

## 2019-10-24 MED ORDER — GLUCOSE BLOOD VI STRP: 1.0000 | ORAL_STRIP | Freq: Two times a day (BID) | 3 refills | Status: DC

## 2019-10-24 MED ORDER — LANCETS MISC: 1.0000 | Freq: Every day | 11 refills | Status: DC

## 2019-10-24 MED ORDER — ACCU-CHEK AVIVA PLUS W/DEVICE KIT: 1.0000 | PACK | Freq: Every day | 0 refills | Status: DC

## 2019-10-24 MED ORDER — ACCU-CHEK AVIVA PLUS VI STRP: ORAL_STRIP | 12 refills | Status: DC

## 2019-10-24 MED ORDER — TRESIBA FLEXTOUCH 200 UNIT/ML ~~LOC~~ SOPN: 100.0000 [IU] | PEN_INJECTOR | Freq: Every day | SUBCUTANEOUS | 11 refills | Status: DC

## 2019-10-24 NOTE — Progress Notes (Signed)
Subjective:    Patient ID: Frank [name], male    DOB: 1955-09-09, 64 y.o.   MRN: [id number]  HPI Pt returns for f/u of diabetes mellitus: DM type: Insulin-requiring type 2 Dx'ed: 0000000 Complications: polyneuropathy and renal insuff.  Therapy: insulin since [id number], Trulicity. DKA: never Severe hypoglycemia: never.   Pancreatitis: never Pancreatic imaging: normal on 2006 CT.   SDOH: due to memory loss, he is not a candidate for multiple daily injections Other: he did not tolerate metformin-XR (GI sxs); he takes QD insulin, due to noncompliance; pt injects his own insulin.   Interval history: no cbg record, but states cbg's vary from 220-436.  he says he does not miss meds.  pt states he feels well in general.  He stopped Trulicity, rather than Tradjenta, per instructions Past Medical History:  Diagnosis Date  . Abdominal pain, left lower quadrant 06/06/2010  . Abdominal pain, unspecified site 01/19/2009  . ADD 10/09/2008  . ALLERGIC RHINITIS 10/09/2008  . Allergy   . ANXIETY DEPRESSION 02/01/2008  . Arthritis    hands ?  . Blood transfusion without reported diagnosis   . Cataract    removed both eyes   . COLONIC POLYPS 02/01/2008  . DIABETES MELLITUS, TYPE II 05/20/2010  . DYSPNEA 03/12/2010  . ERECTILE DYSFUNCTION 10/09/2008  . ERECTILE DYSFUNCTION, ORGANIC 05/20/2010  . FOOT PAIN, LEFT 05/20/2010  . GERD 02/01/2008  . Guillain Barr syndrome ([place])   . HEMORRHOIDS 02/01/2008  . HIATAL HERNIA 02/01/2008  . HYPERLIPIDEMIA 10/09/2008  . HYPERTENSION 10/09/2008  . MORTON'S NEUROMA 05/20/2010  . Other specified forms of hearing loss 06/27/2009  . PERIPHERAL EDEMA 05/20/2010  . PERIPHERAL NEUROPATHY 05/20/2010  . Sleep apnea    stopped cpap   . SLEEP APNEA, OBSTRUCTIVE 02/01/2008  . Stricture and stenosis of esophagus 02/02/2008  . Type II or unspecified type diabetes mellitus without mention of complication, uncontrolled 11/14/2010  . WRIST PAIN, LEFT 12/05/2009    Past Surgical  History:  Procedure Laterality Date  . CARPAL TUNNEL RELEASE Bilateral   . COLONOSCOPY    . DENTAL SURGERY    . ESOPHAGEAL DILATION  july 2009  . ESOPHAGOGASTRODUODENOSCOPY N/A 06/27/2014   Procedure: ESOPHAGOGASTRODUODENOSCOPY (EGD);  Surgeon: Lafayette Dragon, MD;  Location: [facility];  Service: Endoscopy;  Laterality: N/A;  . EYE SURGERY     catract surgery on both eyes  . POLYPECTOMY    . ROTATOR CUFF REPAIR    . UPPER GASTROINTESTINAL ENDOSCOPY      Social History   Socioeconomic History  . Marital status: Single    Spouse name: Not on file  . Number of children: Not on file  . Years of education: Not on file  . Highest education level: Not on file  Occupational History  . Occupation: Musician: [employer]  Tobacco Use  . Smoking status: Never Smoker  . Smokeless tobacco: Never Used  Substance and Sexual Activity  . Alcohol use: Yes    Comment: occasional  . Drug use: Yes    Types: Methaqualone  . Sexual activity: Yes    Comment: before I got sick  Other Topics Concern  . Not on file  Social History Narrative  . Not on file   Social Determinants of Health   Financial Resource Strain:   . Difficulty of Paying Living Expenses:   Food Insecurity:   . Worried About Charity fundraiser in the Last Year:   . [employer] of  Food in the Last Year:   Transportation Needs:   . Film/video editor (Medical):   Marland Kitchen Lack of Transportation (Non-Medical):   Physical Activity:   . Days of Exercise per Week:   . Minutes of Exercise per Session:   Stress:   . Feeling of Stress :   Social Connections:   . Frequency of Communication with Friends and Family:   . Frequency of Social Gatherings with Friends and Family:   . Attends Religious Services:   . Active Member of Clubs or Organizations:   . Attends Archivist Meetings:   Marland Kitchen Marital Status:   Intimate Partner Violence:   . Fear of Current or Ex-Partner:   . Emotionally Abused:   Marland Kitchen  Physically Abused:   . Sexually Abused:     Current Outpatient Medications on File Prior to Visit  Medication Sig Dispense Refill  . ARIPiprazole (ABILIFY) 10 MG tablet Take 1 tablet (10 mg total) by mouth daily. 90 tablet 3  . aspirin EC 81 MG tablet Take 1 tablet (81 mg total) by mouth daily.    . B-D ULTRAFINE III SHORT PEN 31G X 8 MM MISC USE AS DIRECTED WITH TRESIBA PENS DAILY 100 each 2  . cyclobenzaprine (FLEXERIL) 5 MG tablet TAKE 1 TABLET(5 MG) BY MOUTH AT BEDTIME 30 tablet 11  . docusate calcium (SURFAK) 240 MG capsule Take 240 mg by mouth daily.    . DULoxetine (CYMBALTA) 60 MG capsule TAKE 1 CAPSULE BY MOUTH TWICE DAILY 180 capsule 4  . famotidine (PEPCID) 40 MG tablet TAKE 1 TABLET(40 MG) BY MOUTH TWICE DAILY 180 tablet 3  . hydrochlorothiazide (MICROZIDE) 12.5 MG capsule TAKE 1 CAPSULE(12.5 MG) BY MOUTH DAILY 90 capsule 3  . LORazepam (ATIVAN) 0.5 MG tablet TAKE 1 TABLET(0.5 MG) BY MOUTH TWICE DAILY 60 tablet 5  . methylphenidate (CONCERTA) 54 MG PO CR tablet Take 1 tablet (54 mg total) by mouth every morning. 30 tablet 0  . omeprazole (PRILOSEC OTC) 20 MG tablet Take 20 mg by mouth daily.    . pregabalin (LYRICA) 200 MG capsule TAKE (1) CAPSULE TWO TIMES DAILY. 180 capsule 1  . rosuvastatin (CRESTOR) 20 MG tablet TAKE 1 TABLET(20 MG) BY MOUTH DAILY 90 tablet 3  . tapentadol HCl (NUCYNTA) 75 MG tablet Take 1 tablet (75 mg total) by mouth 3 (three) times daily as needed. 90 tablet 0  . vitamin B-12 (CYANOCOBALAMIN) 1000 MCG tablet Take 1 tablet (1,000 mcg total) by mouth daily. 90 tablet 1  . Vitamin D, Ergocalciferol, (DRISDOL) 1.25 MG (50000 UT) CAPS capsule Take 1 capsule (50,000 Units total) by mouth every 7 (seven) days. 12 capsule 0   No current facility-administered medications on file prior to visit.    Allergies  Allergen Reactions  . Codeine Itching    REACTION: Itching  . Influenza Vaccines     Due to hx of GBS  . Metformin And Related Other (See Comments)     GI upset, diarrhea  . Other Other (See Comments)    Due to hx of GBS- vaccines   . Shellfish Allergy     Family History  Problem Relation Age of Onset  . Diabetes Mother   . Heart disease Mother   . Hyperlipidemia Mother   . Depression Mother   . Diabetes Brother   . Colon cancer Neg Hx   . Colon polyps Neg Hx   . Esophageal cancer Neg Hx   . Rectal cancer Neg Hx   .  Stomach cancer Neg Hx     BP 136/80   Pulse (!) 114   Ht 5\' 9"  (1.753 m)   Wt 230 lb (104.3 kg)   SpO2 97%   BMI 33.97 kg/m    Review of Systems He denies hypoglycemia    Objective:   Physical Exam VITAL SIGNS:  See vs page GENERAL: no distress Pulses: dorsalis pedis intact bilat.   MSK: no deformity of the feet CV: no leg edema Skin:  no ulcer on the feet.  normal color and temp on the feet. Neuro: sensation is intact to touch on the feet  Lab Results  Component Value Date   HGBA1C 9.2 (A) 10/24/2019   Lab Results  Component Value Date   CREATININE 1.11 06/08/2019   BUN 16 06/08/2019   NA 138 06/08/2019   K 4.2 06/08/2019   CL 97 06/08/2019   CO2 32 06/08/2019       Assessment & Plan:  Insulin-requiring type 2 DM, with renal insuff: worse Tachycardia, uncertain etiology   Patient Instructions  Your heartbeat is fast today.  Please see your primary care provider soon, to have it rechecked.  Please resume the Trulicity at 1.5 mg weekly, and: Increase the Tresiba to 100 units per day, and: Stop taking the Tradjenta.     check your blood sugar once a day.  vary the time of day when you check, between before the 3 meals, and at bedtime.  also check if you have symptoms of your blood sugar being too high or too low.  please keep a record of the readings and bring it to your next appointment here (or you can bring the meter itself).  You can write it on any piece of paper.  please call us sooner if your blood sugar goes below 70, or if you have a lot of readings over 200.  Please come back  for a follow-up appointment in 2 months.

## 2019-10-24 NOTE — Patient Instructions (Addendum)
Your heartbeat is fast today.  Please see your primary care provider soon, to have it rechecked.  Please resume the Trulicity at 1.5 mg weekly, and: Increase the Tresiba to 100 units per day, and: Stop taking the Tradjenta.     check your blood sugar once a day.  vary the time of day when you check, between before the 3 meals, and at bedtime.  also check if you have symptoms of your blood sugar being too high or too low.  please keep a record of the readings and bring it to your next appointment here (or you can bring the meter itself).  You can write it on any piece of paper.  please call us sooner if your blood sugar goes below 70, or if you have a lot of readings over 200.  Please come back for a follow-up appointment in 2 months.

## 2019-10-25 ENCOUNTER — Telehealth: Payer: Self-pay | Admitting: Internal Medicine

## 2019-10-25 NOTE — Telephone Encounter (Signed)
No note needed 

## 2019-10-28 NOTE — Telephone Encounter (Signed)
Insurance does not cover the One Touch Ultra.   Preferred is the Accu-Chek or True Metrix.   Erx has been sent for meter and test strips. Pt has already picked up the [place].

## 2019-11-01 ENCOUNTER — Other Ambulatory Visit: Payer: Self-pay | Admitting: Neurology

## 2019-11-01 MED ORDER — ARIPIPRAZOLE 10 MG PO TABS: 10.0000 mg | ORAL_TABLET | Freq: Every day | ORAL | 3 refills | Status: DC

## 2019-11-01 MED ORDER — TAPENTADOL HCL 75 MG PO TABS: 75.0000 mg | ORAL_TABLET | Freq: Three times a day (TID) | ORAL | 0 refills | Status: DC | PRN

## 2019-11-01 NOTE — Telephone Encounter (Signed)
Pt called needing a refill on his ARIPiprazole (ABILIFY) 10 MG tablet and his tapentadol HCl (NUCYNTA) 75 MG tablet sent in to the [facility] [address] [facility].

## 2019-11-21 ENCOUNTER — Ambulatory Visit: Payer: Medicare PPO | Attending: Internal Medicine

## 2019-11-21 DIAGNOSIS — Z23 Encounter for immunization: Secondary | ICD-10-CM

## 2019-11-21 NOTE — Progress Notes (Signed)
   Covid-19 Vaccination Clinic  Name:  Frank [name]    MRN: [id number] DOB: [birth year]  11/21/2019  Mr. Zavaleta was observed post Covid-19 immunization for 30 minutes based on pre-vaccination screening without incident. He was provided with Vaccine Information Sheet and instruction to access the V-Safe system.   Mr. Rozek was instructed to call 911 with any severe reactions post vaccine: Marland Kitchen Difficulty breathing  . Swelling of face and throat  . A fast heartbeat  . A bad rash all over body  . Dizziness and weakness   Immunizations Administered    Name Date Dose VIS Date Route   Pfizer COVID-19 Vaccine 11/21/2019  1:25 PM 0.3 mL 07/22/2019 Intramuscular   Manufacturer: [place]   Lot: [id number]   [place]: [id number]

## 2019-11-23 ENCOUNTER — Ambulatory Visit: Payer: Medicare PPO | Admitting: Family Medicine

## 2019-11-23 ENCOUNTER — Encounter: Payer: Self-pay | Admitting: Family Medicine

## 2019-11-23 ENCOUNTER — Other Ambulatory Visit: Payer: Self-pay

## 2019-11-23 VITALS — BP 145/87 | HR 87 | Temp 97.8°F | Ht 67.0 in | Wt 230.0 lb

## 2019-11-23 DIAGNOSIS — G4733 Obstructive sleep apnea (adult) (pediatric): Secondary | ICD-10-CM

## 2019-11-23 DIAGNOSIS — G894 Chronic pain syndrome: Secondary | ICD-10-CM | POA: Diagnosis not present

## 2019-11-23 DIAGNOSIS — E1142 Type 2 diabetes mellitus with diabetic polyneuropathy: Secondary | ICD-10-CM | POA: Diagnosis not present

## 2019-11-23 NOTE — Patient Instructions (Addendum)
We will continue current therapy.   Please let us know if you change you mind about treating sleep apnea. We can consider the titration study or weight management referral and dental referral for consideration of an oral appliance.   Stay as active. Stay well hydrated. Eat a well balanced diet   Follow up with Dr [name] in 4 months  Sleep Apnea Sleep apnea affects breathing during sleep. It causes breathing to stop for a short time or to become shallow. It can also increase the risk of:  Heart attack.  Stroke.  Being very overweight (obese).  Diabetes.  Heart failure.  Irregular heartbeat. The goal of treatment is to help you breathe normally again. What are the causes? There are three kinds of sleep apnea:  Obstructive sleep apnea. This is caused by a blocked or collapsed airway.  Central sleep apnea. This happens when the brain does not send the right signals to the muscles that control breathing.  Mixed sleep apnea. This is a combination of obstructive and central sleep apnea. The most common cause of this condition is a collapsed or blocked airway. This can happen if:  Your throat muscles are too relaxed.  Your tongue and tonsils are too large.  You are overweight.  Your airway is too small. What increases the risk?  Being overweight.  Smoking.  Having a small airway.  Being older.  Being male.  Drinking alcohol.  Taking medicines to calm yourself (sedatives or tranquilizers).  Having family members with the condition. What are the signs or symptoms?  Trouble staying asleep.  Being sleepy or tired during the day.  Getting angry a lot.  Loud snoring.  Headaches in the morning.  Not being able to focus your mind (concentrate).  Forgetting things.  Less interest in sex.  Mood swings.  Personality changes.  Feelings of sadness (depression).  Waking up a lot during the night to pee (urinate).  Dry mouth.  Sore throat. How is this  diagnosed?  Your medical history.  A physical exam.  A test that is done when you are sleeping (sleep study). The test is most often done in a sleep lab but may also be done at home. How is this treated?   Sleeping on your side.  Using a medicine to get rid of mucus in your nose (decongestant).  Avoiding the use of alcohol, medicines to help you relax, or certain pain medicines (narcotics).  Losing weight, if needed.  Changing your diet.  Not smoking.  Using a machine to open your airway while you sleep, such as: ? An oral appliance. This is a mouthpiece that shifts your lower jaw forward. ? A CPAP device. This device blows air through a mask when you breathe out (exhale). ? An EPAP device. This has valves that you put in each nostril. ? A BPAP device. This device blows air through a mask when you breathe in (inhale) and breathe out.  Having surgery if other treatments do not work. It is important to get treatment for sleep apnea. Without treatment, it can lead to:  High blood pressure.  Coronary artery disease.  In men, not being able to have an erection (impotence).  Reduced thinking ability. Follow these instructions at home: Lifestyle  Make changes that your doctor recommends.  Eat a healthy diet.  Lose weight if needed.  Avoid alcohol, medicines to help you relax, and some pain medicines.  Do not use any products that contain nicotine or tobacco, such as cigarettes,  e-cigarettes, and chewing tobacco. If you need help quitting, ask your doctor. General instructions  Take over-the-counter and prescription medicines only as told by your doctor.  If you were given a machine to use while you sleep, use it only as told by your doctor.  If you are having surgery, make sure to tell your doctor you have sleep apnea. You may need to bring your device with you.  Keep all follow-up visits as told by your doctor. This is important. Contact a doctor if:  The  machine that you were given to use during sleep bothers you or does not seem to be working.  You do not get better.  You get worse. Get help right away if:  Your chest hurts.  You have trouble breathing in enough air.  You have an uncomfortable feeling in your back, arms, or stomach.  You have trouble talking.  One side of your body feels weak.  A part of your face is hanging down. These symptoms may be an emergency. Do not wait to see if the symptoms will go away. Get medical help right away. Call your local emergency services (911 in the [place] Do not drive yourself to the hospital. Summary  This condition affects breathing during sleep.  The most common cause is a collapsed or blocked airway.  The goal of treatment is to help you breathe normally while you sleep. This information is not intended to replace advice given to you by your health care provider. Make sure you discuss any questions you have with your health care provider. Document Revised: 05/14/2018 Document Reviewed: 03/23/2018 Elsevier Patient Education  [place].   Diabetic Neuropathy Diabetic neuropathy refers to nerve damage that is caused by diabetes (diabetes mellitus). Over time, people with diabetes can develop nerve damage throughout the body. There are several types of diabetic neuropathy:  Peripheral neuropathy. This is the most common type of diabetic neuropathy. It causes damage to nerves that carry signals between the spinal cord and other parts of the body (peripheral nerves). This usually affects nerves in the feet and legs first, and may eventually affect the hands and arms. The damage affects the ability to sense touch or temperature.  Autonomic neuropathy. This type causes damage to nerves that control involuntary functions (autonomic nerves). These nerves carry signals that control: ? Heartbeat. ? Body temperature. ? Blood pressure. ? Urination. ? Digestion. ? Sweating. ? Sexual  function. ? Response to changing blood sugar (glucose) levels.  Focal neuropathy. This type of nerve damage affects one area of the body, such as an arm, a leg, or the face. The injury may involve one nerve or a small group of nerves. Focal neuropathy can be painful and unpredictable, and occurs most often in older adults with diabetes. This often develops suddenly, but usually improves over time and does not cause long-term problems.  Proximal neuropathy. This type of nerve damage affects the nerves of the thighs, hips, buttocks, or legs. It causes severe pain, weakness, and muscle death (atrophy), usually in the thigh muscles. It is more common among older men and people who have type 2 diabetes. The length of recovery time may vary. What are the causes? Peripheral, autonomic, and focal neuropathies are caused by diabetes that is not well controlled with treatment. The cause of proximal neuropathy is not known, but it may be caused by inflammation related to uncontrolled blood glucose levels. What are the signs or symptoms? Peripheral neuropathy Peripheral neuropathy develops slowly over time.  When the nerves of the feet and legs no longer work, you may experience:  Burning, stabbing, or aching pain in the legs or feet.  Pain or cramping in the legs or feet.  Loss of feeling (numbness) and inability to feel pressure or pain in the feet. This can lead to: ? Thick calluses or sores on areas of constant pressure. ? Ulcers. ? Reduced ability to feel temperature changes.  Foot deformities.  Muscle weakness.  Loss of balance or coordination. Autonomic neuropathy The symptoms of autonomic neuropathy vary depending on which nerves are affected. Symptoms may include:  Problems with digestion, such as: ? Nausea or vomiting. ? Poor appetite. ? Bloating. ? Diarrhea or constipation. ? Trouble swallowing. ? Losing weight without trying to.  Problems with the heart, blood and lungs, such  as: ? Dizziness, especially when standing up. ? Fainting. ? Shortness of breath. ? Irregular heartbeat.  Bladder problems, such as: ? Trouble starting or stopping urination. ? Leaking urine. ? Trouble emptying the bladder. ? Urinary tract infections (UTIs).  Problems with other body functions, such as: ? Sweat. You may sweat too much or too little. ? Temperature. You might get hot easily. Or, you might feel cold more than usual. ? Sexual function. Men may not be able to get or maintain an erection. Women may have vaginal dryness and difficulty with arousal. Focal neuropathy Symptoms affect only one area of the body. Common symptoms include:  Numbness.  Tingling.  Burning pain.  Prickling feeling.  Very sensitive skin.  Weakness.  Inability to move (paralysis).  Muscle twitching.  Muscles getting smaller (wasting).  Poor coordination.  Double or blurred vision. Proximal neuropathy  Sudden, severe pain in the hip, thigh, or buttocks. Pain may spread from the back into the legs (sciatica).  Pain and numbness in the arms and legs.  Tingling.  Loss of bladder control or bowel control.  Weakness and wasting of thigh muscles.  Difficulty getting up from a seated position.  Abdominal swelling.  Unexplained weight loss. How is this diagnosed? Diagnosis usually involves reviewing your medical history and any symptoms you have. Diagnosis varies depending on the type of neuropathy your health care provider suspects. Peripheral neuropathy Your health care provider will check areas that are affected by your nervous system (neurologic exam), such as your reflexes, how you move, and what you can feel. You may have other tests, such as:  Blood tests.  Removal and examination of fluid that surrounds the spinal cord (lumbar puncture).  CT scan.  MRI.  A test to check the nerves that control muscles (electromyogram, EMG).  Tests of how quickly messages pass through  your nerves (nerve conduction velocity tests).  Removal of a small piece of nerve to be examined under a microscope (biopsy). Autonomic neuropathy You may have tests, such as:  Tests to measure your blood pressure and heart rate. This may include monitoring you while you are safely secured to an exam table that moves you from a lying position to an upright position (table tilt test).  Breathing tests to check your lungs.  Tests to check how food moves through the digestive system (gastric emptying tests).  Blood, sweat, or urine tests.  Ultrasound of your bladder.  Spinal fluid tests. Focal neuropathy This condition may be diagnosed with:  A neurologic exam.  CT scan.  MRI.  EMG.  Nerve conduction velocity tests. Proximal neuropathy There is no test to diagnose this type of neuropathy. You may have tests to  rule out other possible causes of this type of neuropathy. Tests may include:  X-rays of your spine and lumbar region.  Lumbar puncture.  MRI. How is this treated? The goal of treatment is to keep nerve damage from getting worse. The most important part of treatment is keeping your blood glucose level and your A1C level within your target range by following your diabetes management plan. Over time, maintaining lower blood glucose levels helps lessen symptoms. In some cases, you may need prescription pain medicine. Follow these instructions at home:  Lifestyle   Do not use any products that contain nicotine or tobacco, such as cigarettes and e-cigarettes. If you need help quitting, ask your health care provider.  Be physically active every day. Include strength training and balance exercises.  Follow a healthy meal plan.  Work with your health care provider to manage your blood pressure. General instructions  Follow your diabetes management plan as directed. ? Check your blood glucose levels as directed by your health care provider. ? Keep your blood glucose  in your target range as directed by your health care provider. ? Have your A1C level checked at least two times a year, or as often as told by your health care provider.  Take over the counter and prescription medicines only as told by your health care provider. This includes insulin and diabetes medicine.  Do not drive or use heavy machinery while taking prescription pain medicines.  Check your skin and feet every day for cuts, bruises, redness, blisters, or sores.  Keep all follow up visits as told by your health care provider. This is important. Contact a health care provider if:  You have burning, stabbing, or aching pain in your legs or feet.  You are unable to feel pressure or pain in your feet.  You develop problems with digestion, such as: ? Nausea. ? Vomiting. ? Bloating. ? Constipation. ? Diarrhea. ? Abdominal pain.  You have difficulty with urination, such as inability: ? To control when you urinate (incontinence). ? To completely empty the bladder (retention).  You have palpitations.  You feel dizzy, weak, or faint when you stand up. Get help right away if:  You cannot urinate.  You have sudden weakness or loss of coordination.  You have trouble speaking.  You have pain or pressure in your chest.  You have an irregular heart beat.  You have sudden inability to move a part of your body. Summary  Diabetic neuropathy refers to nerve damage that is caused by diabetes. It can affect nerves throughout the entire body, causing numbness and pain in the arms, legs, digestive tract, heart, and other body systems.  Keep your blood glucose level and your blood pressure in your target range, as directed by your health care provider. This can help prevent neuropathy from getting worse.  Check your skin and feet every day for cuts, bruises, redness, blisters, or sores.  Do not use any products that contain nicotine or tobacco, such as cigarettes and e-cigarettes. If  you need help quitting, ask your health care provider. This information is not intended to replace advice given to you by your health care provider. Make sure you discuss any questions you have with your health care provider. Document Revised: 09/09/2017 Document Reviewed: 09/01/2016 Elsevier Patient Education  2020 [employer].

## 2019-11-23 NOTE — Progress Notes (Signed)
I have read the note, and I agree with the clinical assessment and plan.  Angelin Cutrone A. Shantel Helwig, MD, PhD, FAAN Certified in Neurology, Clinical Neurophysiology, Sleep Medicine, Pain Medicine and Neuroimaging  [facility] [address], [address] [place], [place] [address] [phone]  

## 2019-11-23 NOTE — Progress Notes (Signed)
PATIENT: Frank [name] DOB: 1955/11/21  REASON FOR VISIT: follow up HISTORY FROM: patient  Chief Complaint  Patient presents with  . Follow-up    polyneuropathy, rm 1, alone      HISTORY OF PRESENT ILLNESS: Today 11/23/19 Frank [name] is a 64 y.o. male here today for follow up of polyneuropathy, chronic pain and OSA. He did not return to have titration study performed. He does not feel that he will comply with therapy. He understands the risks associated with untreated sleep apnea but does not feel he will be able to use CPAP therapy. He continues duloxetine 49m twice daily, Lyrica 2042m(last refilled on 10/16/2019) twice daily and Nucynta 7524mID (11/03/2019). Also taking lorazepam 0.5mg92mD (10/25/2019) and methylphenidate ER 54mg30mly (01/2019) prescribed by PCP. He has "slacked off" of taking methylphenidate. He is working closely with PCP. Dr EllisLoanne Drillingo) manages DMT2. Diabetes is not well controlled. Last A1C was 9.2.    HISTORY: (copied from Dr SaterGarth Bigness on 06/06/2019)  After the last visit we tested a Home sleep study and he ws found to have severe OSA (AHI = 69.3).   He was placed on Auto-PAP but only tried off/on for a few weeks.   He felt suffocated and stopped using it.  The longest he used it any night was 8 hours and he felt he could not sleep with it.      He has a full facemask.    I discussed option:   CPAP titration in-lab to try to optimize PAP treatment, referral to surgery or weight loss with oral appliance.    He continues to report difficulties with his polyneuropathy.  He is noting more pain since the Lyrica was reduced.    He is still taking Nucynta 75 mg tid, Lyrica 200 mg po bid and Cymbalta 60 mg po bid.    His PCP prescribes lorazepam 0.5 mg and methylphenidate ER 54 qAM.    EPWORTH SLEEPINESS SCALE  On a scale of 0 - 3 what is the chance of dozing:  Sitting and Reading:                           2 Watching TV:                                       2 Sitting inactive in a public place:        1 Passenger in car for one hour:           3 Lying down to rest in the afternoon:   3 Sitting and talking to someone:          0 Sitting quietly after lunch:                   3 In a car, stopped in traffic:                  0  Does not drive  Total (out of 24):   14/24 moderate ESS     REVIEW OF SYSTEMS: Out of a complete 14 system review of symptoms, the patient complains only of the following symptoms, neuropathy, chronic pain and all other reviewed systems are negative.  ALLERGIES: Allergies  Allergen Reactions  . Codeine Itching    REACTION: Itching  . Influenza Vaccines  Due to hx of GBS  . Metformin And Related Other (See Comments)    GI upset, diarrhea  . Other Other (See Comments)    Due to hx of GBS- vaccines   . Shellfish Allergy     HOME MEDICATIONS: Outpatient Medications Prior to Visit  Medication Sig Dispense Refill  . ARIPiprazole (ABILIFY) 10 MG tablet Take 1 tablet (10 mg total) by mouth daily. 90 tablet 3  . aspirin EC 81 MG tablet Take 1 tablet (81 mg total) by mouth daily.    . cyclobenzaprine (FLEXERIL) 5 MG tablet TAKE 1 TABLET(5 MG) BY MOUTH AT BEDTIME 30 tablet 11  . DULoxetine (CYMBALTA) 60 MG capsule TAKE 1 CAPSULE BY MOUTH TWICE DAILY 180 capsule 4  . pregabalin (LYRICA) 200 MG capsule TAKE (1) CAPSULE TWO TIMES DAILY. 180 capsule 1  . tapentadol HCl (NUCYNTA) 75 MG tablet Take 1 tablet (75 mg total) by mouth 3 (three) times daily as needed. 90 tablet 0  . B-D ULTRAFINE III SHORT PEN 31G X 8 MM MISC USE AS DIRECTED WITH TRESIBA PENS DAILY 100 each 2  . Blood Glucose Monitoring Suppl (ACCU-CHEK AVIVA PLUS) w/Device KIT Apply 1 Device topically daily. Once daily E11.9 1 kit 0  . docusate calcium (SURFAK) 240 MG capsule Take 240 mg by mouth daily.    . Dulaglutide (TRULICITY) 1.5 CV/8.9FY SOPN Inject 1.5 mg into the skin once a week. 4 pen 11  . famotidine (PEPCID) 40 MG tablet TAKE 1  TABLET(40 MG) BY MOUTH TWICE DAILY 180 tablet 3  . glucose blood (ACCU-CHEK AVIVA PLUS) test strip Use as instructed once daily E11.9 100 each 12  . hydrochlorothiazide (MICROZIDE) 12.5 MG capsule TAKE 1 CAPSULE(12.5 MG) BY MOUTH DAILY 90 capsule 3  . insulin degludec (TRESIBA FLEXTOUCH) 200 UNIT/ML FlexTouch Pen Inject 100 Units into the skin daily. 6 pen 11  . Lancets MISC Apply 1 Device topically daily. Once daily E11.9 100 each 11  . LORazepam (ATIVAN) 0.5 MG tablet TAKE 1 TABLET(0.5 MG) BY MOUTH TWICE DAILY 60 tablet 5  . methylphenidate (CONCERTA) 54 MG PO CR tablet Take 1 tablet (54 mg total) by mouth every morning. 30 tablet 0  . omeprazole (PRILOSEC OTC) 20 MG tablet Take 20 mg by mouth daily.    . rosuvastatin (CRESTOR) 20 MG tablet TAKE 1 TABLET(20 MG) BY MOUTH DAILY 90 tablet 3  . vitamin B-12 (CYANOCOBALAMIN) 1000 MCG tablet Take 1 tablet (1,000 mcg total) by mouth daily. 90 tablet 1  . Vitamin D, Ergocalciferol, (DRISDOL) 1.25 MG (50000 UT) CAPS capsule Take 1 capsule (50,000 Units total) by mouth every 7 (seven) days. 12 capsule 0   No facility-administered medications prior to visit.    PAST MEDICAL HISTORY: Past Medical History:  Diagnosis Date  . Abdominal pain, left lower quadrant 06/06/2010  . Abdominal pain, unspecified site 01/19/2009  . ADD 10/09/2008  . ALLERGIC RHINITIS 10/09/2008  . Allergy   . ANXIETY DEPRESSION 02/01/2008  . Arthritis    hands ?  . Blood transfusion without reported diagnosis   . Cataract    removed both eyes   . COLONIC POLYPS 02/01/2008  . DIABETES MELLITUS, TYPE II 05/20/2010  . DYSPNEA 03/12/2010  . ERECTILE DYSFUNCTION 10/09/2008  . ERECTILE DYSFUNCTION, ORGANIC 05/20/2010  . FOOT PAIN, LEFT 05/20/2010  . GERD 02/01/2008  . Guillain Barr syndrome ([place])   . HEMORRHOIDS 02/01/2008  . HIATAL HERNIA 02/01/2008  . HYPERLIPIDEMIA 10/09/2008  . HYPERTENSION 10/09/2008  . MORTON'S NEUROMA  05/20/2010  . Other specified forms of hearing loss 06/27/2009   . PERIPHERAL EDEMA 05/20/2010  . PERIPHERAL NEUROPATHY 05/20/2010  . Sleep apnea    stopped cpap   . SLEEP APNEA, OBSTRUCTIVE 02/01/2008  . Stricture and stenosis of esophagus 02/02/2008  . Type II or unspecified type diabetes mellitus without mention of complication, uncontrolled 11/14/2010  . WRIST PAIN, LEFT 12/05/2009    PAST SURGICAL HISTORY: Past Surgical History:  Procedure Laterality Date  . CARPAL TUNNEL RELEASE Bilateral   . COLONOSCOPY    . DENTAL SURGERY    . ESOPHAGEAL DILATION  july 2009  . ESOPHAGOGASTRODUODENOSCOPY N/A 06/27/2014   Procedure: ESOPHAGOGASTRODUODENOSCOPY (EGD);  Surgeon: Lafayette Dragon, MD;  Location: [facility];  Service: Endoscopy;  Laterality: N/A;  . EYE SURGERY     catract surgery on both eyes  . POLYPECTOMY    . ROTATOR CUFF REPAIR    . UPPER GASTROINTESTINAL ENDOSCOPY      FAMILY HISTORY: Family History  Problem Relation Age of Onset  . Diabetes Mother   . Heart disease Mother   . Hyperlipidemia Mother   . Depression Mother   . Diabetes Brother   . Colon cancer Neg Hx   . Colon polyps Neg Hx   . Esophageal cancer Neg Hx   . Rectal cancer Neg Hx   . Stomach cancer Neg Hx     SOCIAL HISTORY: Social History   Socioeconomic History  . Marital status: Single    Spouse name: Not on file  . Number of children: Not on file  . Years of education: Not on file  . Highest education level: Not on file  Occupational History  . Occupation: Musician: [employer]  Tobacco Use  . Smoking status: Never Smoker  . Smokeless tobacco: Never Used  Substance and Sexual Activity  . Alcohol use: Yes    Comment: occasional  . Drug use: Yes    Types: Methaqualone  . Sexual activity: Yes    Comment: before I got sick  Other Topics Concern  . Not on file  Social History Narrative  . Not on file   Social Determinants of Health   Financial Resource Strain:   . Difficulty of Paying Living Expenses:   Food Insecurity:    . Worried About Charity fundraiser in the Last Year:   . Arboriculturist in the Last Year:   Transportation Needs:   . Film/video editor (Medical):   Marland Kitchen Lack of Transportation (Non-Medical):   Physical Activity:   . Days of Exercise per Week:   . Minutes of Exercise per Session:   Stress:   . Feeling of Stress :   Social Connections:   . Frequency of Communication with Friends and Family:   . Frequency of Social Gatherings with Friends and Family:   . Attends Religious Services:   . Active Member of Clubs or Organizations:   . Attends Archivist Meetings:   Marland Kitchen Marital Status:   Intimate Partner Violence:   . Fear of Current or Ex-Partner:   . Emotionally Abused:   Marland Kitchen Physically Abused:   . Sexually Abused:       PHYSICAL EXAM  Vitals:   11/23/19 1249  BP: (!) 145/87  Pulse: 87  Temp: 97.8 F (36.6 C)  Weight: 230 lb (104.3 kg)  Height: '5\' 7"'  (1.702 m)   Body mass index is 36.02 kg/m.  Generalized: Well developed, in no acute  distress  Cardiology: normal rate and rhythm, no murmur noted Neurological examination  Mentation: Alert oriented to time, place, history taking. Follows all commands speech and language fluent Cranial nerve II-XII: Pupils were equal round reactive to light. Extraocular movements were full, visual field were full on confrontational test. Facial sensation and strength were normal. Uvula tongue midline. Head turning and shoulder shrug  were normal and symmetric. Motor: The motor testing reveals 5 over 5 strength of all 4 extremities. Good symmetric motor tone is noted throughout.  Sensory: Sensory testing is intact to soft touch on all 4 extremities. No evidence of extinction is noted.  Coordination: Cerebellar testing reveals good finger-nose-finger and heel-to-shin bilaterally.  Gait and station: Gait is short. Decreased arm swing. Mildly stooped posture.Tandem not attempted. Romberg is positive.   Reflexes: Deep tendon reflexes are  symmetric and normal bilaterally.   DIAGNOSTIC DATA (LABS, IMAGING, TESTING) - I reviewed patient records, labs, notes, testing and imaging myself where available.  No flowsheet data found.   Lab Results  Component Value Date   WBC 10.9 (H) 06/08/2019   HGB 15.9 06/08/2019   HCT 46.0 06/08/2019   MCV 85.0 06/08/2019   PLT 208.0 06/08/2019      Component Value Date/Time   NA 138 06/08/2019 1036   K 4.2 06/08/2019 1036   CL 97 06/08/2019 1036   CO2 32 06/08/2019 1036   GLUCOSE 199 (H) 06/08/2019 1036   BUN 16 06/08/2019 1036   CREATININE 1.11 06/08/2019 1036   CALCIUM 9.7 06/08/2019 1036   PROT 7.2 06/08/2019 1036   PROT 7.0 08/30/2015 1157   ALBUMIN 4.4 06/08/2019 1036   AST 19 06/08/2019 1036   ALT 19 06/08/2019 1036   ALKPHOS 87 06/08/2019 1036   BILITOT 0.7 06/08/2019 1036   GFRNONAA >90 08/06/2014 0240   GFRAA >90 08/06/2014 0240   Lab Results  Component Value Date   CHOL 141 06/08/2019   HDL 45.30 06/08/2019   LDLCALC 23 11/16/2017   LDLDIRECT 57.0 06/08/2019   TRIG 282.0 (H) 06/08/2019   CHOLHDL 3 06/08/2019   Lab Results  Component Value Date   HGBA1C 9.2 (A) 10/24/2019   Lab Results  Component Value Date   VITAMINB12 188 (L) 06/08/2019   Lab Results  Component Value Date   TSH 2.84 06/08/2019       ASSESSMENT AND PLAN 64 y.o. year old male  has a past medical history of Abdominal pain, left lower quadrant (06/06/2010), Abdominal pain, unspecified site (01/19/2009), ADD (10/09/2008), ALLERGIC RHINITIS (10/09/2008), Allergy, ANXIETY DEPRESSION (02/01/2008), Arthritis, Blood transfusion without reported diagnosis, Cataract, COLONIC POLYPS (02/01/2008), DIABETES MELLITUS, TYPE II (05/20/2010), DYSPNEA (03/12/2010), ERECTILE DYSFUNCTION (10/09/2008), ERECTILE DYSFUNCTION, ORGANIC (05/20/2010), FOOT PAIN, LEFT (05/20/2010), GERD (02/01/2008), Guillain Barr syndrome ([place]), HEMORRHOIDS (02/01/2008), HIATAL HERNIA (02/01/2008), HYPERLIPIDEMIA (10/09/2008), HYPERTENSION  (10/09/2008), MORTON'S NEUROMA (05/20/2010), Other specified forms of hearing loss (06/27/2009), PERIPHERAL EDEMA (05/20/2010), PERIPHERAL NEUROPATHY (05/20/2010), Sleep apnea, SLEEP APNEA, OBSTRUCTIVE (02/01/2008), Stricture and stenosis of esophagus (02/02/2008), Type II or unspecified type diabetes mellitus without mention of complication, uncontrolled (11/14/2010), and WRIST PAIN, LEFT (12/05/2009). here with     ICD-10-CM   1. Diabetic polyneuropathy associated with type 2 diabetes mellitus (HCC)  E11.42   2. Obstructive sleep apnea  G47.33   3. Chronic pain syndrome  G89.4     Trimaine feels that symptoms are stable today.  We will continue duloxetine 60 mg twice daily, Lyrica 200 mg twice daily and Nucynta 75 mg 3 times daily.  We have  reviewed common side effects of these medications, specifically, respiratory depression.  I have discussed concerns of untreated sleep apnea in detail.  We have reviewed risk of untreated sleep apnea.  He verbalizes understanding and is aware of these risk but does not feel like he can comply with CPAP therapy at this time.  We have discussed the importance of weight management and duration of using an oral appliance.  He will continue close follow-up with his primary care provider and endocrinology for management of diabetes and other comorbidities.  Adequate hydration, well-balanced diet and regular exercise advised.  He will follow up with Dr. Mariea Stable in 4 months, sooner if needed.  He verbalizes understanding and agreement with this plan.   No orders of the defined types were placed in this encounter.    No orders of the defined types were placed in this encounter.     I spent 20 minutes with the patient. 50% of this time was spent counseling and educating patient on plan of care and medications.    Debbora Presto, FNP-C 11/23/2019, 3:30 PM [facility] [address], [place], [place] [address] [phone]

## 2019-12-14 ENCOUNTER — Ambulatory Visit: Payer: Medicare PPO

## 2019-12-19 ENCOUNTER — Ambulatory Visit: Payer: Medicare PPO | Attending: Internal Medicine

## 2019-12-19 DIAGNOSIS — Z23 Encounter for immunization: Secondary | ICD-10-CM

## 2019-12-19 NOTE — Progress Notes (Signed)
   Covid-19 Vaccination Clinic  Name:  Frank [name]    MRN: [id number] DOB: 1955/12/24  12/19/2019  [name] was observed post Covid-19 immunization for 15 minutes without incident. He was provided with Vaccine Information Sheet and instruction to access the V-Safe system.   [name] was instructed to call 911 with any severe reactions post vaccine: Marland Kitchen Difficulty breathing  . Swelling of face and throat  . A fast heartbeat  . A bad rash all over body  . Dizziness and weakness   Immunizations Administered    Name Date Dose VIS Date Route   Pfizer COVID-19 Vaccine 12/19/2019  8:53 AM 0.3 mL 10/05/2018 Intramuscular   Manufacturer: [place]   Lot: [id number]   [place]: [id number]

## 2019-12-22 ENCOUNTER — Other Ambulatory Visit: Payer: Self-pay | Admitting: Family Medicine

## 2019-12-22 MED ORDER — TAPENTADOL HCL 75 MG PO TABS: 75.0000 mg | ORAL_TABLET | Freq: Three times a day (TID) | ORAL | 0 refills | Status: DC | PRN

## 2019-12-22 NOTE — Telephone Encounter (Signed)
1) Medication(s) Requested (by name): tapentadol HCl (NUCYNTA) 75 MG tablet [  2) Pharmacy of Choice:  [facility] [id number] [facility] [address], [place] [facility] [address]  [place], [place] [address]

## 2019-12-26 ENCOUNTER — Telehealth: Payer: Self-pay

## 2019-12-26 ENCOUNTER — Ambulatory Visit: Payer: Medicare PPO | Admitting: Endocrinology

## 2019-12-26 ENCOUNTER — Encounter: Payer: Self-pay | Admitting: Endocrinology

## 2019-12-26 ENCOUNTER — Other Ambulatory Visit: Payer: Self-pay

## 2019-12-26 VITALS — BP 126/82 | HR 107 | Ht 67.0 in | Wt 230.0 lb

## 2019-12-26 DIAGNOSIS — E1142 Type 2 diabetes mellitus with diabetic polyneuropathy: Secondary | ICD-10-CM | POA: Diagnosis not present

## 2019-12-26 DIAGNOSIS — E1165 Type 2 diabetes mellitus with hyperglycemia: Secondary | ICD-10-CM

## 2019-12-26 DIAGNOSIS — IMO0002 Reserved for concepts with insufficient information to code with codable children: Secondary | ICD-10-CM

## 2019-12-26 LAB — POCT GLYCOSYLATED HEMOGLOBIN (HGB A1C): Hemoglobin A1C: 9.1 % — AB (ref 4.0–5.6)

## 2019-12-26 MED ORDER — TRULICITY 3 MG/0.5ML ~~LOC~~ SOAJ: 3.0000 mg | SUBCUTANEOUS | 3 refills | Status: DC

## 2019-12-26 NOTE — Telephone Encounter (Signed)
PRIOR AUTHORIZATION  PA initiation date: 12/26/19  Medication: Trulicity 3mg /0.32mL Insurance Company: [place] completed electronically through [employer] My Meds: Yes  Will await insurance response re: approval/denial.  Frank [name] (KeyHP:[id number])  Your information has been sent to [facility].  Frank [name] Key: [id number] - PA Case ID: [id number] - Rx #: [id number] Need help? Call us at [phone] Status Sent to [facility] Trulicity 3MG /0.5ML pen-injectors [place] [phone]

## 2019-12-26 NOTE — Progress Notes (Signed)
Subjective:    Patient ID: Frank [name], male    DOB: 09/23/1955, 64 y.o.   MRN: [id number]  HPI Pt returns for f/u of diabetes mellitus: DM type: Insulin-requiring type 2 Dx'ed: 9211 Complications: polyneuropathy and renal insuff.  Therapy: insulin since 9417, Trulicity. DKA: never Severe hypoglycemia: never.   Pancreatitis: never Pancreatic imaging: normal on 2006 CT.   SDOH: due to memory loss, he is not a candidate for multiple daily injections Other: he did not tolerate metformin-XR (GI sxs); he takes QD insulin, due to noncompliance; pt injects his own insulin.   Interval history: He brings a record of his cbg's which I have reviewed today.  cbg's vary from 131-413.  he says he does not miss meds.  pt states he feels well in general.  He had mild hypoglycemia only once since last ov.  This was with colonoscopy.   Past Medical History:  Diagnosis Date  . Abdominal pain, left lower quadrant 06/06/2010  . Abdominal pain, unspecified site 01/19/2009  . ADD 10/09/2008  . ALLERGIC RHINITIS 10/09/2008  . Allergy   . ANXIETY DEPRESSION 02/01/2008  . Arthritis    hands ?  . Blood transfusion without reported diagnosis   . Cataract    removed both eyes   . COLONIC POLYPS 02/01/2008  . DIABETES MELLITUS, TYPE II 05/20/2010  . DYSPNEA 03/12/2010  . ERECTILE DYSFUNCTION 10/09/2008  . ERECTILE DYSFUNCTION, ORGANIC 05/20/2010  . FOOT PAIN, LEFT 05/20/2010  . GERD 02/01/2008  . Guillain Barr syndrome ([place])   . HEMORRHOIDS 02/01/2008  . HIATAL HERNIA 02/01/2008  . HYPERLIPIDEMIA 10/09/2008  . HYPERTENSION 10/09/2008  . MORTON'S NEUROMA 05/20/2010  . Other specified forms of hearing loss 06/27/2009  . PERIPHERAL EDEMA 05/20/2010  . PERIPHERAL NEUROPATHY 05/20/2010  . Sleep apnea    stopped cpap   . SLEEP APNEA, OBSTRUCTIVE 02/01/2008  . Stricture and stenosis of esophagus 02/02/2008  . Type II or unspecified type diabetes mellitus without mention of complication, uncontrolled 11/14/2010    . WRIST PAIN, LEFT 12/05/2009    Past Surgical History:  Procedure Laterality Date  . CARPAL TUNNEL RELEASE Bilateral   . COLONOSCOPY    . DENTAL SURGERY    . ESOPHAGEAL DILATION  july 2009  . ESOPHAGOGASTRODUODENOSCOPY N/A 06/27/2014   Procedure: ESOPHAGOGASTRODUODENOSCOPY (EGD);  Surgeon: Lafayette Dragon, MD;  Location: [facility];  Service: Endoscopy;  Laterality: N/A;  . EYE SURGERY     catract surgery on both eyes  . POLYPECTOMY    . ROTATOR CUFF REPAIR    . UPPER GASTROINTESTINAL ENDOSCOPY      Social History   Socioeconomic History  . Marital status: Single    Spouse name: Not on file  . Number of children: Not on file  . Years of education: Not on file  . Highest education level: Not on file  Occupational History  . 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 Smoking status: Never Smoker  . Smokeless tobacco: Never Used  Substance and Sexual Activity  . Alcohol use: Yes    Comment: occasional  . Drug use: Yes    Types: Methaqualone  . Sexual activity: Yes    Comment: before I got sick  Other Topics Concern  . Not on file  Social History Narrative  . Not on file   Social Determinants of Health   Financial Resource Strain:   . Difficulty of Paying Living Expenses:   Food Insecurity:   .  Worried About Charity fundraiser in the Last Year:   . Arboriculturist in the Last Year:   Transportation Needs:   . Film/video editor (Medical):   Marland Kitchen Lack of Transportation (Non-Medical):   Physical Activity:   . Days of Exercise per Week:   . Minutes of Exercise per Session:   Stress:   . Feeling of Stress :   Social Connections:   . Frequency of Communication with Friends and Family:   . Frequency of Social Gatherings with Friends and Family:   . Attends Religious Services:   . Active Member of Clubs or Organizations:   . Attends Archivist Meetings:   Marland Kitchen Marital Status:   Intimate Partner Violence:   . Fear of  Current or Ex-Partner:   . Emotionally Abused:   Marland Kitchen Physically Abused:   . Sexually Abused:     Current Outpatient Medications on File Prior to Visit  Medication Sig Dispense Refill  . ARIPiprazole (ABILIFY) 10 MG tablet Take 1 tablet (10 mg total) by mouth daily. 90 tablet 3  . aspirin EC 81 MG tablet Take 1 tablet (81 mg total) by mouth daily.    . B-D ULTRAFINE III SHORT PEN 31G X 8 MM MISC USE AS DIRECTED WITH TRESIBA PENS DAILY 100 each 2  . Blood Glucose Monitoring Suppl (ACCU-CHEK AVIVA PLUS) w/Device KIT Apply 1 Device topically daily. Once daily E11.9 1 kit 0  . cyclobenzaprine (FLEXERIL) 5 MG tablet TAKE 1 TABLET(5 MG) BY MOUTH AT BEDTIME 30 tablet 11  . docusate calcium (SURFAK) 240 MG capsule Take 240 mg by mouth daily.    . DULoxetine (CYMBALTA) 60 MG capsule TAKE 1 CAPSULE BY MOUTH TWICE DAILY 180 capsule 4  . famotidine (PEPCID) 40 MG tablet TAKE 1 TABLET(40 MG) BY MOUTH TWICE DAILY 180 tablet 3  . glucose blood (ACCU-CHEK AVIVA PLUS) test strip Use as instructed once daily E11.9 100 each 12  . hydrochlorothiazide (MICROZIDE) 12.5 MG capsule TAKE 1 CAPSULE(12.5 MG) BY MOUTH DAILY 90 capsule 3  . insulin degludec (TRESIBA FLEXTOUCH) 200 UNIT/ML FlexTouch Pen Inject 100 Units into the skin daily. 6 pen 11  . Lancets MISC Apply 1 Device topically daily. Once daily E11.9 100 each 11  . LORazepam (ATIVAN) 0.5 MG tablet TAKE 1 TABLET(0.5 MG) BY MOUTH TWICE DAILY 60 tablet 5  . methylphenidate (CONCERTA) 54 MG PO CR tablet Take 1 tablet (54 mg total) by mouth every morning. 30 tablet 0  . omeprazole (PRILOSEC OTC) 20 MG tablet Take 20 mg by mouth daily.    . pregabalin (LYRICA) 200 MG capsule TAKE (1) CAPSULE TWO TIMES DAILY. 180 capsule 1  . rosuvastatin (CRESTOR) 20 MG tablet TAKE 1 TABLET(20 MG) BY MOUTH DAILY 90 tablet 3  . tapentadol HCl (NUCYNTA) 75 MG tablet Take 1 tablet (75 mg total) by mouth 3 (three) times daily as needed. 90 tablet 0  . vitamin B-12 (CYANOCOBALAMIN) 1000  MCG tablet Take 1 tablet (1,000 mcg total) by mouth daily. 90 tablet 1  . Vitamin D, Ergocalciferol, (DRISDOL) 1.25 MG (50000 UT) CAPS capsule Take 1 capsule (50,000 Units total) by mouth every 7 (seven) days. 12 capsule 0   No current facility-administered medications on file prior to visit.    Allergies  Allergen Reactions  . Codeine Itching    REACTION: Itching  . Influenza Vaccines     Due to hx of GBS  . Metformin And Related Other (See Comments)  GI upset, diarrhea  . Other Other (See Comments)    Due to hx of GBS- vaccines   . Shellfish Allergy     Family History  Problem Relation Age of Onset  . Diabetes Mother   . Heart disease Mother   . Hyperlipidemia Mother   . Depression Mother   . Diabetes Brother   . Colon cancer Neg Hx   . Colon polyps Neg Hx   . Esophageal cancer Neg Hx   . Rectal cancer Neg Hx   . Stomach cancer Neg Hx     BP 126/82   Pulse (!) 107   Ht '5\' 7"'  (1.702 m)   Wt 230 lb (104.3 kg)   SpO2 93%   BMI 36.02 kg/m    Review of Systems Denies nausea.      Objective:   Physical Exam VITAL SIGNS:  See vs page GENERAL: no distress Pulses: dorsalis pedis intact bilat.   MSK: no deformity of the feet CV: no leg edema Skin:  no ulcer on the feet.  normal color and temp on the feet. Neuro: sensation is intact to touch on the feet.   Ext: there is bilateral onychomycosis of the toenails.    A1c=9.1%  Lab Results  Component Value Date   TSH 2.84 06/08/2019      Assessment & Plan:  Insulin-requiring type 2 DM: he needs increased rx.     Patient Instructions  I have sent a prescription to your pharmacy, to increase the Trulicity, and: Please continue the same [place].   check your blood sugar once a day.  vary the time of day when you check, between before the 3 meals, and at bedtime.  also check if you have symptoms of your blood sugar being too high or too low.  please keep a record of the readings and bring it to your next  appointment here (or you can bring the meter itself).  You can write it on any piece of paper.  please call us sooner if your blood sugar goes below 70, or if you have a lot of readings over 200.  Please come back for a follow-up appointment in 2 months.

## 2019-12-26 NOTE — Patient Instructions (Addendum)
I have sent a prescription to your pharmacy, to increase the Trulicity, and: Please continue the same [place].   check your blood sugar once a day.  vary the time of day when you check, between before the 3 meals, and at bedtime.  also check if you have symptoms of your blood sugar being too high or too low.  please keep a record of the readings and bring it to your next appointment here (or you can bring the meter itself).  You can write it on any piece of paper.  please call us sooner if your blood sugar goes below 70, or if you have a lot of readings over 200.  Please come back for a follow-up appointment in 2 months.

## 2019-12-26 NOTE — Telephone Encounter (Signed)
APPROVAL  Medication: [employer]: [employer] PA response: Approved Approval dates: 12/26/19 through 08/10/20  Documents have been labeled and placed in scan file for HIM and for our future reference.  Frank [name] (Key: [id number])  This request has been approved.  Please note any additional information provided by [facility] at the bottom of your screen.  [name] Key: [id number] - PA Case ID: [id number] - Rx #: [id number] Need help? Call us at [phone] Outcome Approvedtoday PA Case: [id number], Status: Approved, Coverage Starts on: 08/12/2019 12:00:00 AM, Coverage Ends on: 08/10/2020 12:00:00 AM. Questions? Contact [phone]. Drug Trulicity 3MG /0.5ML pen-injectors Form Corporate investment banker Info [phone]

## 2020-01-09 ENCOUNTER — Other Ambulatory Visit: Payer: Self-pay | Admitting: Internal Medicine

## 2020-01-10 NOTE — Telephone Encounter (Signed)
Last OV 06/08/19, next OV 06/13/20 Does not appear that the medication was ever picked up by pt b/c the prescription expired 12/05/19 with 5 refills still remaining.

## 2020-01-10 NOTE — Telephone Encounter (Signed)
Done erx 

## 2020-01-16 ENCOUNTER — Other Ambulatory Visit: Payer: Self-pay | Admitting: Family Medicine

## 2020-01-16 NOTE — Telephone Encounter (Signed)
Pt has called for a refill on his pregabalin (LYRICA) 200 MG capsule to [facility] [phone]

## 2020-01-17 NOTE — Progress Notes (Signed)
This encounter was created in error - please disregard.

## 2020-01-17 NOTE — Addendum Note (Signed)
Addended by: Elita Quick on: 01/17/2020 04:52 PM   Modules accepted: Orders

## 2020-01-17 NOTE — Telephone Encounter (Signed)
Drug registry checked. Last fill was 10/16/19.

## 2020-01-18 MED ORDER — PREGABALIN 200 MG PO CAPS: ORAL_CAPSULE | ORAL | 1 refills | Status: DC

## 2020-02-06 ENCOUNTER — Other Ambulatory Visit: Payer: Self-pay | Admitting: Neurology

## 2020-02-06 MED ORDER — TAPENTADOL HCL 75 MG PO TABS: 75.0000 mg | ORAL_TABLET | Freq: Three times a day (TID) | ORAL | 0 refills | Status: DC | PRN

## 2020-02-06 NOTE — Telephone Encounter (Signed)
Pt is up to date on his appts and is due for a refill on nucynta. [place] Controlled Substance Registry checked and is appropriate.

## 2020-02-06 NOTE — Addendum Note (Signed)
Addended by: Schaad [place] on: 02/06/2020 10:42 AM   Modules accepted: Orders

## 2020-02-06 NOTE — Telephone Encounter (Signed)
Patient called stating  He needs a refill on Nucynta.

## 2020-02-27 ENCOUNTER — Ambulatory Visit: Payer: Medicare PPO | Admitting: Endocrinology

## 2020-02-27 ENCOUNTER — Other Ambulatory Visit: Payer: Self-pay

## 2020-02-27 VITALS — BP 144/96 | HR 97 | Ht 67.01 in | Wt 228.0 lb

## 2020-02-27 DIAGNOSIS — IMO0002 Reserved for concepts with insufficient information to code with codable children: Secondary | ICD-10-CM

## 2020-02-27 DIAGNOSIS — E1165 Type 2 diabetes mellitus with hyperglycemia: Secondary | ICD-10-CM

## 2020-02-27 DIAGNOSIS — E1142 Type 2 diabetes mellitus with diabetic polyneuropathy: Secondary | ICD-10-CM

## 2020-02-27 LAB — POCT GLYCOSYLATED HEMOGLOBIN (HGB A1C): Hemoglobin A1C: 8.6 % — AB (ref 4.0–5.6)

## 2020-02-27 MED ORDER — TRULICITY 4.5 MG/0.5ML ~~LOC~~ SOAJ
4.5000 mg | SUBCUTANEOUS | 3 refills | Status: DC
Start: 2020-02-27 — End: 2020-09-03

## 2020-02-27 MED ORDER — BD PEN NEEDLE SHORT U/F 31G X 8 MM MISC: 2 refills | Status: DC

## 2020-02-27 NOTE — Progress Notes (Signed)
Subjective:    Patient ID: Frank [name], male    DOB: 10/05/1955, 64 y.o.   MRN: [id number]  HPI Pt returns for f/u of diabetes mellitus: DM type: Insulin-requiring type 2 Dx'ed: 5427 Complications: PN and CRI.  Therapy: insulin since 0623, Trulicity. DKA: never Severe hypoglycemia: never.   Pancreatitis: never Pancreatic imaging: normal on 2006 CT.   SDOH: due to memory loss, he is not a candidate for multiple daily injections.   Other: he did not tolerate metformin-XR (GI sxs); he takes QD insulin, due to noncompliance; pt injects his own insulin.   Interval history: no cbg record, but states cbg's vary from 120-289.  he says he does not miss meds.  pt states he feels well in general.   Past Medical History:  Diagnosis Date  . Abdominal pain, left lower quadrant 06/06/2010  . Abdominal pain, unspecified site 01/19/2009  . ADD 10/09/2008  . ALLERGIC RHINITIS 10/09/2008  . Allergy   . ANXIETY DEPRESSION 02/01/2008  . Arthritis    hands ?  . Blood transfusion without reported diagnosis   . Cataract    removed both eyes   . COLONIC POLYPS 02/01/2008  . DIABETES MELLITUS, TYPE II 05/20/2010  . DYSPNEA 03/12/2010  . ERECTILE DYSFUNCTION 10/09/2008  . ERECTILE DYSFUNCTION, ORGANIC 05/20/2010  . FOOT PAIN, LEFT 05/20/2010  . GERD 02/01/2008  . Guillain Barr syndrome ([place])   . HEMORRHOIDS 02/01/2008  . HIATAL HERNIA 02/01/2008  . HYPERLIPIDEMIA 10/09/2008  . HYPERTENSION 10/09/2008  . MORTON'S NEUROMA 05/20/2010  . Other specified forms of hearing loss 06/27/2009  . PERIPHERAL EDEMA 05/20/2010  . PERIPHERAL NEUROPATHY 05/20/2010  . Sleep apnea    stopped cpap   . SLEEP APNEA, OBSTRUCTIVE 02/01/2008  . Stricture and stenosis of esophagus 02/02/2008  . Type II or unspecified type diabetes mellitus without mention of complication, uncontrolled 11/14/2010  . WRIST PAIN, LEFT 12/05/2009    Past Surgical History:  Procedure Laterality Date  . CARPAL TUNNEL RELEASE Bilateral   .  COLONOSCOPY    . DENTAL SURGERY    . ESOPHAGEAL DILATION  july 2009  . ESOPHAGOGASTRODUODENOSCOPY N/A 06/27/2014   Procedure: ESOPHAGOGASTRODUODENOSCOPY (EGD);  Surgeon: Lafayette Dragon, MD;  Location: [facility];  Service: Endoscopy;  Laterality: N/A;  . EYE SURGERY     catract surgery on both eyes  . POLYPECTOMY    . ROTATOR CUFF REPAIR    . UPPER GASTROINTESTINAL ENDOSCOPY      Social History   Socioeconomic History  . Marital status: Single    Spouse name: Not on file  . Number of children: Not on file  . Years of education: Not on file  . Highest education level: Not on file  Occupational History  . Occupation: Musician: [employer]  Tobacco Use  . Smoking status: Never Smoker  . Smokeless tobacco: Never Used  Vaping Use  . Vaping Use: Never used  Substance and Sexual Activity  . Alcohol use: Yes    Comment: occasional  . Drug use: Yes    Types: Methaqualone  . Sexual activity: Yes    Comment: before I got sick  Other Topics Concern  . Not on file  Social History Narrative  . Not on file   Social Determinants of Health   Financial Resource Strain:   . Difficulty of Paying Living Expenses:   Food Insecurity:   . Worried About Charity fundraiser in the Last Year:   .  Ran Out of Food in the Last Year:   Transportation Needs:   . Film/video editor (Medical):   Marland Kitchen Lack of Transportation (Non-Medical):   Physical Activity:   . Days of Exercise per Week:   . Minutes of Exercise per Session:   Stress:   . Feeling of Stress :   Social Connections:   . Frequency of Communication with Friends and Family:   . Frequency of Social Gatherings with Friends and Family:   . Attends Religious Services:   . Active Member of Clubs or Organizations:   . Attends Archivist Meetings:   Marland Kitchen Marital Status:   Intimate Partner Violence:   . Fear of Current or Ex-Partner:   . Emotionally Abused:   Marland Kitchen Physically Abused:   . Sexually Abused:      Current Outpatient Medications on File Prior to Visit  Medication Sig Dispense Refill  . Blood Glucose Monitoring Suppl (ACCU-CHEK AVIVA PLUS) w/Device KIT Apply 1 Device topically daily. Once daily E11.9 1 kit 0  . glucose blood (ACCU-CHEK AVIVA PLUS) test strip Use as instructed once daily E11.9 100 each 12  . insulin degludec (TRESIBA FLEXTOUCH) 200 UNIT/ML FlexTouch Pen Inject 100 Units into the skin daily. 6 pen 11  . Lancets MISC Apply 1 Device topically daily. Once daily E11.9 100 each 11  . ARIPiprazole (ABILIFY) 10 MG tablet Take 1 tablet (10 mg total) by mouth daily. 90 tablet 3  . aspirin EC 81 MG tablet Take 1 tablet (81 mg total) by mouth daily.    . cyclobenzaprine (FLEXERIL) 5 MG tablet TAKE 1 TABLET(5 MG) BY MOUTH AT BEDTIME 30 tablet 11  . docusate calcium (SURFAK) 240 MG capsule Take 240 mg by mouth daily.    . DULoxetine (CYMBALTA) 60 MG capsule TAKE 1 CAPSULE BY MOUTH TWICE DAILY 180 capsule 4  . famotidine (PEPCID) 40 MG tablet TAKE 1 TABLET(40 MG) BY MOUTH TWICE DAILY 180 tablet 3  . hydrochlorothiazide (MICROZIDE) 12.5 MG capsule TAKE 1 CAPSULE(12.5 MG) BY MOUTH DAILY 90 capsule 3  . LORazepam (ATIVAN) 0.5 MG tablet TAKE 1 TABLET(0.5 MG) BY MOUTH TWICE DAILY 60 tablet 3  . methylphenidate (CONCERTA) 54 MG PO CR tablet Take 1 tablet (54 mg total) by mouth every morning. 30 tablet 0  . omeprazole (PRILOSEC OTC) 20 MG tablet Take 20 mg by mouth daily.    . pregabalin (LYRICA) 200 MG capsule TAKE (1) CAPSULE TWO TIMES DAILY. 180 capsule 1  . rosuvastatin (CRESTOR) 20 MG tablet TAKE 1 TABLET(20 MG) BY MOUTH DAILY 90 tablet 3  . tapentadol HCl (NUCYNTA) 75 MG tablet Take 1 tablet (75 mg total) by mouth 3 (three) times daily as needed. 90 tablet 0  . vitamin B-12 (CYANOCOBALAMIN) 1000 MCG tablet Take 1 tablet (1,000 mcg total) by mouth daily. 90 tablet 1  . Vitamin D, Ergocalciferol, (DRISDOL) 1.25 MG (50000 UT) CAPS capsule Take 1 capsule (50,000 Units total) by mouth  every 7 (seven) days. 12 capsule 0   No current facility-administered medications on file prior to visit.    Allergies  Allergen Reactions  . Codeine Itching    REACTION: Itching  . Influenza Vaccines     Due to hx of GBS  . Metformin And Related Other (See Comments)    GI upset, diarrhea  . Other Other (See Comments)    Due to hx of GBS- vaccines   . Shellfish Allergy     Family History  Problem Relation Age of  Onset  . Diabetes Mother   . Heart disease Mother   . Hyperlipidemia Mother   . Depression Mother   . Diabetes Brother   . Colon cancer Neg Hx   . Colon polyps Neg Hx   . Esophageal cancer Neg Hx   . Rectal cancer Neg Hx   . Stomach cancer Neg Hx     BP (!) 144/96 (BP Location: Left Arm, Patient Position: Sitting)   Pulse 97   Ht 5' 7.01" (1.702 m)   Wt 228 lb (103.4 kg)   SpO2 96%   BMI 35.70 kg/m    Review of Systems He denies hypoglycemia    Objective:   Physical Exam VITAL SIGNS:  See vs page GENERAL: no distress Pulses: dorsalis pedis intact bilat.   MSK: no deformity of the feet CV: no leg edema Skin:  no ulcer on the feet.  normal color and temp on the feet. Neuro: sensation is intact to touch on the feet, but decreased from normal Ext: there is bilateral onychomycosis of the toenails.     Lab Results  Component Value Date   HGBA1C 8.6 (A) 02/27/2020       Assessment & Plan:  HTN: is noted today Type 2 DM: she needs increased rx Obesity: increasing Trulicity might help.   Patient Instructions  Your blood pressure is high today.  Please see your primary care provider soon, to have it rechecked I have sent a prescription to your pharmacy, to increase the Trulicity again.  You can use up the current pills by taking 1 shot every 5 days.   Please continue the same [place].   check your blood sugar once a day.  vary the time of day when you check, between before the 3 meals, and at bedtime.  also check if you have symptoms of your blood  sugar being too high or too low.  please keep a record of the readings and bring it to your next appointment here (or you can bring the meter itself).  You can write it on any piece of paper.  please call us sooner if your blood sugar goes below 70, or if you have a lot of readings over 200.  Please come back for a follow-up appointment in 2 months.

## 2020-02-27 NOTE — Patient Instructions (Addendum)
Your blood pressure is high today.  Please see your primary care provider soon, to have it rechecked I have sent a prescription to your pharmacy, to increase the Trulicity again.  You can use up the current pills by taking 1 shot every 5 days.   Please continue the same [place].   check your blood sugar once a day.  vary the time of day when you check, between before the 3 meals, and at bedtime.  also check if you have symptoms of your blood sugar being too high or too low.  please keep a record of the readings and bring it to your next appointment here (or you can bring the meter itself).  You can write it on any piece of paper.  please call us sooner if your blood sugar goes below 70, or if you have a lot of readings over 200.  Please come back for a follow-up appointment in 2 months.

## 2020-03-20 ENCOUNTER — Other Ambulatory Visit: Payer: Self-pay | Admitting: Family Medicine

## 2020-03-20 MED ORDER — TAPENTADOL HCL 75 MG PO TABS: 75.0000 mg | ORAL_TABLET | Freq: Three times a day (TID) | ORAL | 0 refills | Status: DC | PRN

## 2020-03-20 NOTE — Telephone Encounter (Signed)
Pt request refill tapentadol HCl (NUCYNTA) 75 MG tablet at [id number]  

## 2020-04-09 ENCOUNTER — Ambulatory Visit: Payer: Medicare PPO | Admitting: Neurology

## 2020-04-09 ENCOUNTER — Other Ambulatory Visit: Payer: Self-pay

## 2020-04-09 ENCOUNTER — Encounter: Payer: Self-pay | Admitting: Neurology

## 2020-04-09 VITALS — BP 120/70 | HR 106 | Ht 67.0 in | Wt 230.5 lb

## 2020-04-09 DIAGNOSIS — G894 Chronic pain syndrome: Secondary | ICD-10-CM | POA: Diagnosis not present

## 2020-04-09 DIAGNOSIS — R208 Other disturbances of skin sensation: Secondary | ICD-10-CM

## 2020-04-09 DIAGNOSIS — E1142 Type 2 diabetes mellitus with diabetic polyneuropathy: Secondary | ICD-10-CM

## 2020-04-09 DIAGNOSIS — G4733 Obstructive sleep apnea (adult) (pediatric): Secondary | ICD-10-CM

## 2020-04-09 MED ORDER — DULOXETINE HCL 60 MG PO CPEP: 60.0000 mg | ORAL_CAPSULE | Freq: Two times a day (BID) | ORAL | 4 refills | Status: DC

## 2020-04-09 MED ORDER — PREGABALIN 200 MG PO CAPS: ORAL_CAPSULE | ORAL | 1 refills | Status: DC

## 2020-04-09 NOTE — Progress Notes (Signed)
[facility]  PATIENT: Frank [name] DOB: 17-Jul-1956  _________________________________   HISTORICAL  CHIEF COMPLAINT:  Chief Complaint  Patient presents with  . Follow-up    RM 12, alone. Last seen 11/23/19 by AL,NP. Here to f/u on chronic pain. Denies any new sx Stable.    HISTORY OF PRESENT ILLNESS:  Frank [name] is a 64 year old man with a chronic peripheral neuropathy who had superimposed Guillain Barr syndrome in November 2015 and has had severe neuropathic pain  Update 04/09/2020: He is reporting more intense pain in his limbs,, mostly legs and hands.Pain is burning in quality.   He also feels his muscles are stiff, .   He is on Nucynta 75 mg tid, Lyrica 200 mg po bid and Cymbalta 60 mg po bid.    His PCP prescribes lorazepam 0.5 mg and methylphenidate ER 54 qAM.   He has a lot of numbness in his feet.   No sores.     He has seen podiatry     He is sleeping well.   He has OSA (severe 69.3) but was unable to tolerate CPAP. He has no major weight change since last visit.   He wakes up a couple times a night due to nocturia but quickly falls back asleep..  We discussed he has severe OSA but did not tolerate CPAP and is not interested in surgery or oral appliance.  We discussed trying to get weight under 200 pounds which might help.  He denies much daytime sleepiness now.     Update 06/06/2019: After the last visit we tested a Home sleep study and he ws found to have severe OSA (AHI = 69.3).   He was placed on Auto-PAP but only tried off/on for a few weeks.   He felt suffocated and stopped using it.  The longest he used it any night was 8 hours and he felt he could not sleep with it.      He has a full facemask.    I discussed option:   CPAP titration in-lab to try to optimize PAP treatment, referral to surgery or weight loss with oral appliance.    He continues to report difficulties with his polyneuropathy.  He is noting more pain since the Lyrica was  reduced.    He is still taking Nucynta 75 mg tid, Lyrica 200 mg po bid and Cymbalta 60 mg po bid.    His PCP prescribes lorazepam 0.5 mg and methylphenidate ER 54 qAM.    EPWORTH SLEEPINESS SCALE  On a scale of 0 - 3 what is the chance of dozing:  Sitting and Reading:   2 Watching TV:    2 Sitting inactive in a public place: 1 Passenger in car for one hour: 3 Lying down to rest in the afternoon: 3 Sitting and talking to someone: 0 Sitting quietly after lunch:  3 In a car, stopped in traffic:  0  Does not drive  Total (out of 24):   14/24 moderate ESS     Update 11/22/2018 (virtual): He feels he is doing about the same.  The worse pain is in his hands.   He has numbness up to his knees.  With the medication, he feels his pain is tolerable.  He has not shown any drug escalation or inappropriate behavior.  [place] controlled substance database was reviewed and he has been compliant.  He feels his gait is worse.   Balance is reduced.   He only walks around  his house.     He is very sleepy during the day.  This is felt to be worse by his husband.  He takes Lyrica 200 mg po tid.   He takes ativan twice a day. He takes Nucynta 75 mg po tid and feels it helps him a lot.     He takes flexeril 5 mg at bedtime only.     He has OSA (saw Dr. Annamaria Boots in 2008) but never started CPAP.  We were going to repeat the PSG but the expense was going to be too high for him and it has not been done.  EPWORTH SLEEPINESS SCALE  On a scale of 0 - 3 what is the chance of dozing:  Sitting and Reading:  2 Watching TV:   2 Sitting inactive in a public place: 2 Passenger in car for one hour: 3 Lying down to rest in the afternoon: 3 Sitting and talking to someone: 0 Sitting quietly after lunch:  3 In a car, stopped in traffic:  0  Does not drive  Total (out of 24):   15/24 moderate ESS      Update 06/28/18: He is doing about the same.     The medications help t take the edge off of the neuropathic  pain.    Nucynta helps the most.     The [place] shows good compliance.     The leg pain goes up to his knees.   There is numbness and the pain is severe pins/needles.  Also pain in his hands. Pain is worse with walking. He feels his gait is stable and has no falls.    His stride is about the same.   He has some ankle/toe weakness.      He notes some weakness in his hands and also has stiffness in the fingers.  He never had the CPAP titration.  He dozes off in the afternoons.    He canceled the CPAP titration earlier because of expense (has no MCR supplement)  Update 03/08/2018: He feels he is doing about the same.   He feels he gets some benefit from Lyrica, Duloxetine and Nucynta.   He tolerates them well.   No s.e.   No constipation.   The [place] has been reviewed and he is not getting medications from other sites.   No drug seeking behavior.   He has the worse pn in his feet, up to the knees and in his hands.   There is numbness as well.   He feels the symptoms are stable.    Activity like walking increases the pain and rest reduces pain some.   He has DM.     He has OSA but never had the CPAP titration.  Due to high cost, he never had the titration.      He gets drowsy in the evenings and frequently dozes off watching TV.      Mood is doing ok.    The combination of Cymbalta and Abilify has helped hte depression and anxiety.      From 10/19/2017: He feels the neuropathic dysesthetic pain is stable.   Lyrica and Nucynta and Duloxetine have helped.    He tolerates them well.    He has not escalated Nucynta use or shown drug seeking behavior.    The [place] was reviewed and is appropriate.     The foot numbness is the same.    His CTS hand pain is about the same and  bothers him every day.    Surgery and injections had not helped much.     His OSA remains untreated by his choice.   He does not think he could use CPAP or an oral appliance.   He dozes off some evenings but not everyday ([place] is  high at 14/24).   He has lost 10 pounds since starting insulin.    His depression seems to be ok on Cymbalta and Abilify.    Anxiety is mild on these med's.       From 9/10/20198: Chronic neuropathic pain:  He feels the leg pain is stable.    Pain is up to the knees and in the hands.   Current pain is treated with Lyrica, Duloxetine and Nucynta.  His polyneuropathy pain started a few years before he was diagnosed with NIDDM .   He had GBS in November 2015 and dysesthesias worsened after that.  He has numbness up to his knees and in both hands.    He stumbles but no falls.     He also notes weakness in his feet and hands.     Mood:   He feels the depression is a little worse than last visit.    He initially did better on combination of Cymbalta and Abilify.    He tolerates both of these medications well.    He also takes lorazepam for anxiety.    CTS:   His left hand pain is unchanged and still bothersome at times.     He has a history of carpal tunnel syndrome.   He had surgery in the past.   He is not wearing the splint much since it did not help.    In the past, carpal tunnel injections have helped a little bit .    OSA:   He was diagnosed with OSA around 2011 but never started CPAP.   He does not want to use CPAP or an oral appliance.   He notes fatigue and sleepiness.   He falls asleep watching TV and in the evenings.         EPWORTH SLEEPINESS SCALE  On a scale of 0 - 3 what is the chance of dozing:  Sitting and Reading:   1 Watching TV:    3 Sitting inactive in a public place: 1 Passenger in car for one hour: 3 Lying down to rest in the afternoon: 3 Sitting and talking to someone: 0 Sitting quietly after lunch:  3 In a car, stopped in traffic:  0  Total (out of 24):  14/24  (mild EDS)   GBS History:   The initial episode of GBS was treated with plasmapheresis. He reports that the diagnosis took a couple weeks and he had several emergency room visits. Initially he was sent to a  skilled nursing facility. He was reporting a lot of back pain alongside the weakness. A lumbar puncture was eventually done showing high protein which led to the dye diagnosis of GBS.  He had severe weakness at the peak and required intubation for respiratory support. At that time, he was unable to move his legs and could barely move his arms. He improved quite a bit after plasmapheresis and was discharged. However, a couple days after discharge he began to feel weak again and return to the emergency room. He was recently admitted for recurrent GBS. For that hospital stay he did not require intubation but did have a facemask oxygen. He also was treated with IVIG  the second time around. He improved and was discharged home. He did outpatient physical therapy. He has had a fairly good in movement of his strength and is able to walk independently. However, he has had more dysesthetic pain.   With the exception of more pain, he otherwise feels very close to his pre-GBS baseline.   Of note, he had a flu shot a few weeks before the first episode of GBS.  MRI's from 06/2014 and 07/2014:    Cervical MRI shows spinal stenosis at C4-C5, C5-C6 and C6-C7. There is no spinal cord compression. Thoracic spine shows T8-T9 disc extrusion to the left that does not cause spinal cord compression. There are mild degenerative changes in the lumbar spine. Contrast was not used.   I also reviewed many of the notes and labs from his hospital stay. CSF Protein was greatly elevated at 118 and the CSF white blood cell count was 1.    REVIEW OF SYSTEMS: Constitutional: No fevers, chills, sweats, or change in appetite.   Has sleepiness Eyes: No visual changes, double vision, eye pain Ear, nose and throat: No hearing loss, ear pain, nasal congestion, sore throat Cardiovascular: No chest pain, palpitations Respiratory: No shortness of breath at rest or with exertion.   No wheezes.  He has OSA GastrointestinaI: No nausea, vomiting,  diarrhea, abdominal pain, fecal incontinence Genitourinary: No dysuria, urinary retention or frequency.  No nocturia. Musculoskeletal: No neck pain, back pain Integumentary: No rash, pruritus, skin lesions Neurological: as above Psychiatric: Anxiety and depression at this time.   ADD is stable. Endocrine: No palpitations, diaphoresis, change in appetite, change in weigh or increased thirst Hematologic/Lymphatic: No anemia, purpura, petechiae. Allergic/Immunologic: No itchy/runny eyes, nasal congestion, recent allergic reactions, rashes  ALLERGIES: Allergies  Allergen Reactions  . Codeine Itching    REACTION: Itching  . Influenza Vaccines     Due to hx of GBS  . Metformin And Related Other (See Comments)    GI upset, diarrhea  . Other Other (See Comments)    Due to hx of GBS- vaccines   . Shellfish Allergy     HOME MEDICATIONS:  Current Outpatient Medications:  .  ARIPiprazole (ABILIFY) 10 MG tablet, Take 1 tablet (10 mg total) by mouth daily., Disp: 90 tablet, Rfl: 3 .  aspirin EC 81 MG tablet, Take 1 tablet (81 mg total) by mouth daily., Disp: , Rfl:  .  Blood Glucose Monitoring Suppl (ACCU-CHEK AVIVA PLUS) w/Device KIT, Apply 1 Device topically daily. Once daily E11.9, Disp: 1 kit, Rfl: 0 .  cyclobenzaprine (FLEXERIL) 5 MG tablet, TAKE 1 TABLET(5 MG) BY MOUTH AT BEDTIME, Disp: 30 tablet, Rfl: 11 .  docusate calcium (SURFAK) 240 MG capsule, Take 240 mg by mouth daily., Disp: , Rfl:  .  Dulaglutide (TRULICITY) 4.5 HL/4.5GY SOPN, Inject 0.5 mLs (4.5 mg total) as directed once a week., Disp: 12 pen, Rfl: 3 .  DULoxetine (CYMBALTA) 60 MG capsule, TAKE 1 CAPSULE BY MOUTH TWICE DAILY, Disp: 180 capsule, Rfl: 4 .  famotidine (PEPCID) 40 MG tablet, TAKE 1 TABLET(40 MG) BY MOUTH TWICE DAILY, Disp: 180 tablet, Rfl: 3 .  glucose blood (ACCU-CHEK AVIVA PLUS) test strip, Use as instructed once daily E11.9, Disp: 100 each, Rfl: 12 .  hydrochlorothiazide (MICROZIDE) 12.5 MG capsule, TAKE 1  CAPSULE(12.5 MG) BY MOUTH DAILY, Disp: 90 capsule, Rfl: 3 .  insulin degludec (TRESIBA FLEXTOUCH) 200 UNIT/ML FlexTouch Pen, Inject 100 Units into the skin daily., Disp: 6 pen, Rfl: 11 .  Insulin  Pen Needle (B-D ULTRAFINE III SHORT PEN) 31G X 8 MM MISC, USE AS DIRECTED WITH TRESIBA PENS DAILY, Disp: 100 each, Rfl: 2 .  Lancets MISC, Apply 1 Device topically daily. Once daily E11.9, Disp: 100 each, Rfl: 11 .  LORazepam (ATIVAN) 0.5 MG tablet, TAKE 1 TABLET(0.5 MG) BY MOUTH TWICE DAILY, Disp: 60 tablet, Rfl: 3 .  methylphenidate (CONCERTA) 54 MG PO CR tablet, Take 1 tablet (54 mg total) by mouth every morning., Disp: 30 tablet, Rfl: 0 .  omeprazole (PRILOSEC OTC) 20 MG tablet, Take 20 mg by mouth daily., Disp: , Rfl:  .  pregabalin (LYRICA) 200 MG capsule, TAKE (1) CAPSULE TWO TIMES DAILY., Disp: 180 capsule, Rfl: 1 .  rosuvastatin (CRESTOR) 20 MG tablet, TAKE 1 TABLET(20 MG) BY MOUTH DAILY, Disp: 90 tablet, Rfl: 3 .  tapentadol HCl (NUCYNTA) 75 MG tablet, Take 1 tablet (75 mg total) by mouth 3 (three) times daily as needed., Disp: 90 tablet, Rfl: 0 .  vitamin B-12 (CYANOCOBALAMIN) 1000 MCG tablet, Take 1 tablet (1,000 mcg total) by mouth daily., Disp: 90 tablet, Rfl: 1 .  Vitamin D, Ergocalciferol, (DRISDOL) 1.25 MG (50000 UT) CAPS capsule, Take 1 capsule (50,000 Units total) by mouth every 7 (seven) days., Disp: 12 capsule, Rfl: 0  PAST MEDICAL HISTORY: Past Medical History:  Diagnosis Date  . Abdominal pain, left lower quadrant 06/06/2010  . Abdominal pain, unspecified site 01/19/2009  . ADD 10/09/2008  . ALLERGIC RHINITIS 10/09/2008  . Allergy   . ANXIETY DEPRESSION 02/01/2008  . Arthritis    hands ?  . Blood transfusion without reported diagnosis   . Cataract    removed both eyes   . COLONIC POLYPS 02/01/2008  . DIABETES MELLITUS, TYPE II 05/20/2010  . DYSPNEA 03/12/2010  . ERECTILE DYSFUNCTION 10/09/2008  . ERECTILE DYSFUNCTION, ORGANIC 05/20/2010  . FOOT PAIN, LEFT 05/20/2010  . GERD  02/01/2008  . Guillain Barr syndrome ([place])   . HEMORRHOIDS 02/01/2008  . HIATAL HERNIA 02/01/2008  . HYPERLIPIDEMIA 10/09/2008  . HYPERTENSION 10/09/2008  . MORTON'S NEUROMA 05/20/2010  . Other specified forms of hearing loss 06/27/2009  . PERIPHERAL EDEMA 05/20/2010  . PERIPHERAL NEUROPATHY 05/20/2010  . Sleep apnea    stopped cpap   . SLEEP APNEA, OBSTRUCTIVE 02/01/2008  . Stricture and stenosis of esophagus 02/02/2008  . Type II or unspecified type diabetes mellitus without mention of complication, uncontrolled 11/14/2010  . WRIST PAIN, LEFT 12/05/2009    PAST SURGICAL HISTORY: Past Surgical History:  Procedure Laterality Date  . CARPAL TUNNEL RELEASE Bilateral   . COLONOSCOPY    . DENTAL SURGERY    . ESOPHAGEAL DILATION  july 2009  . ESOPHAGOGASTRODUODENOSCOPY N/A 06/27/2014   Procedure: ESOPHAGOGASTRODUODENOSCOPY (EGD);  Surgeon: Lafayette Dragon, MD;  Location: [facility];  Service: Endoscopy;  Laterality: N/A;  . EYE SURGERY     catract surgery on both eyes  . POLYPECTOMY    . ROTATOR CUFF REPAIR    . UPPER GASTROINTESTINAL ENDOSCOPY      FAMILY HISTORY: Family History  Problem Relation Age of Onset  . Diabetes Mother   . Heart disease Mother   . Hyperlipidemia Mother   . Depression Mother   . Diabetes Brother   . Colon cancer Neg Hx   . Colon polyps Neg Hx   . Esophageal cancer Neg Hx   . Rectal cancer Neg Hx   . Stomach cancer Neg Hx     SOCIAL HISTORY:  Social History   Socioeconomic History  .  Marital status: Single    Spouse name: Not on file  . Number of children: Not on file  . Years of education: Not on file  . Highest education level: Not on file  Occupational History  . Occupation: Musician: [employer]  Tobacco Use  . Smoking status: Never Smoker  . Smokeless tobacco: Never Used  Vaping Use  . Vaping Use: Never used  Substance and Sexual Activity  . Alcohol use: Yes    Comment: occasional  . Drug use: Yes    Types:  Methaqualone  . Sexual activity: Yes    Comment: before I got sick  Other Topics Concern  . Not on file  Social History Narrative  . Not on file   Social Determinants of Health   Financial Resource Strain:   . Difficulty of Paying Living Expenses: Not on file  Food Insecurity:   . Worried About Charity fundraiser in the Last Year: Not on file  . Ran Out of Food in the Last Year: Not on file  Transportation Needs:   . Lack of Transportation (Medical): Not on file  . Lack of Transportation (Non-Medical): Not on file  Physical Activity:   . Days of Exercise per Week: Not on file  . Minutes of Exercise per Session: Not on file  Stress:   . Feeling of Stress : Not on file  Social Connections:   . Frequency of Communication with Friends and Family: Not on file  . Frequency of Social Gatherings with Friends and Family: Not on file  . Attends Religious Services: Not on file  . Active Member of Clubs or Organizations: Not on file  . Attends Archivist Meetings: Not on file  . Marital Status: Not on file  Intimate Partner Violence:   . Fear of Current or Ex-Partner: Not on file  . Emotionally Abused: Not on file  . Physically Abused: Not on file  . Sexually Abused: Not on file     PHYSICAL EXAM  Vitals:   04/09/20 1235  BP: 120/70  Pulse: (!) 106  SpO2: 94%  Weight: 230 lb 8 oz (104.6 kg)  Height: '5\' 7"'  (1.702 m)    Body mass index is 36.1 kg/m.   General: The patient is well-developed and well-nourished and in no acute distress  Skin: Extremities are without rash. He has mild pedal edema, slightly worse on the left.    HEENT:   His pharynx is [place]atti 4.  Neurologic Exam  Mental status: The patient is alert and oriented x 3 at the time of the examination. The patient has apparent normal recent and remote memory, with an apparently normal attention span and concentration ability.   Speech is normal.  Cranial nerves: Extraocular movements are full.  Facial strength and sensation are normal..  Trapezius and sternocleidomastoid strength is normal. No dysarthria is noted.  The tongue is midline, and the patient has symmetric elevation of the soft palate. No obvious hearing deficits are noted.  Motor:  Muscle bulk is normal. Muscle tone is normal. Strength is 5/5 proximally in the arms and legs.  Strength was 4+/5 in the intrinsic hand muscles and the EHL and intrinsic foot muscles.  Sensory: Sensory testing is intact at the wrist but reduced vib (normal touch) at fingertips.   There is mild reduced vibration sensation at the knees, severe loss at ankles; absent at toes.   Touch is reduced  mid shin and very reduced in  the feet.  Coordination: He has good finger-nose-finger but reduced heel-to-shin bilaterally.  Gait and station: Station is normal.   The gait has a reduced stride.   Tandem gait is poor and wide. Romberg is positive.   Reflexes: Deep tendon reflexes are symmetric and 1+ bilaterally at deltoid and trace at knees, absent at the ankles.   DIAGNOSTIC DATA (LABS, IMAGING, TESTING) - I reviewed patient records, labs, notes, testing and imaging myself where available.  Lab Results  Component Value Date   WBC 10.9 (H) 06/08/2019   HGB 15.9 06/08/2019   HCT 46.0 06/08/2019   MCV 85.0 06/08/2019   PLT 208.0 06/08/2019      Component Value Date/Time   NA 138 06/08/2019 1036   K 4.2 06/08/2019 1036   CL 97 06/08/2019 1036   CO2 32 06/08/2019 1036   GLUCOSE 199 (H) 06/08/2019 1036   BUN 16 06/08/2019 1036   CREATININE 1.11 06/08/2019 1036   CALCIUM 9.7 06/08/2019 1036   PROT 7.2 06/08/2019 1036   PROT 7.0 08/30/2015 1157   ALBUMIN 4.4 06/08/2019 1036   AST 19 06/08/2019 1036   ALT 19 06/08/2019 1036   ALKPHOS 87 06/08/2019 1036   BILITOT 0.7 06/08/2019 1036   GFRNONAA >90 08/06/2014 0240   GFRAA >90 08/06/2014 0240   Lab Results  Component Value Date   CHOL 141 06/08/2019   HDL 45.30 06/08/2019   LDLCALC 23  11/16/2017   LDLDIRECT 57.0 06/08/2019   TRIG 282.0 (H) 06/08/2019   CHOLHDL 3 06/08/2019   Lab Results  Component Value Date   HGBA1C 8.6 (A) 02/27/2020   Lab Results  Component Value Date   VITAMINB12 188 (L) 06/08/2019   Lab Results  Component Value Date   TSH 2.84 06/08/2019       ASSESSMENT AND PLAN  No diagnosis found.  1.  Continue Lyrica and Nucynta and Cymbalta for neuropathic pain. 2.   We again discussed about his severe sleep apnea.  It increases risk of heart attack, stroke, arrhythmia and sudden death.  We discussed also not to take any more than the prescribed amount of medication as they may add to the sleep apnea to suppress breathing more  3.    Stay active and exercise as tolerated. 4.    Return in 4 months or sooner if there are new or worsening neurologic symptoms.  Neve Branscomb A. [name], MD, PhD 09/02/4823, 0:03 PM Certified in Neurology, Clinical Neurophysiology, Sleep Medicine, Pain Medicine and Neuroimaging  [facility] [address], [place], [place] [address] [phone]

## 2020-04-16 ENCOUNTER — Other Ambulatory Visit: Payer: Self-pay | Admitting: Neurology

## 2020-04-30 ENCOUNTER — Other Ambulatory Visit: Payer: Self-pay

## 2020-04-30 ENCOUNTER — Ambulatory Visit: Payer: Medicare PPO | Admitting: Endocrinology

## 2020-04-30 ENCOUNTER — Encounter: Payer: Self-pay | Admitting: Endocrinology

## 2020-04-30 VITALS — BP 150/82 | HR 107 | Ht 67.0 in | Wt 231.0 lb

## 2020-04-30 DIAGNOSIS — E1142 Type 2 diabetes mellitus with diabetic polyneuropathy: Secondary | ICD-10-CM

## 2020-04-30 DIAGNOSIS — E1165 Type 2 diabetes mellitus with hyperglycemia: Secondary | ICD-10-CM

## 2020-04-30 DIAGNOSIS — IMO0002 Reserved for concepts with insufficient information to code with codable children: Secondary | ICD-10-CM

## 2020-04-30 LAB — POCT GLYCOSYLATED HEMOGLOBIN (HGB A1C): Hemoglobin A1C: 7.7 % — AB (ref 4.0–5.6)

## 2020-04-30 NOTE — Patient Instructions (Addendum)
Your blood pressure is high today.  Please see your primary care provider soon, to have it rechecked Please continue the same diabetes medications check your blood sugar once a day.  vary the time of day when you check, between before the 3 meals, and at bedtime.  also check if you have symptoms of your blood sugar being too high or too low.  please keep a record of the readings and bring it to your next appointment here (or you can bring the meter itself).  You can write it on any piece of paper.  please call us sooner if your blood sugar goes below 70, or if you have a lot of readings over 200.  Please come back for a follow-up appointment in January

## 2020-04-30 NOTE — Progress Notes (Signed)
Subjective:    Patient ID: Frank [name], male    DOB: Dec 29, 1955, 64 y.o.   MRN: [id number]  HPI Pt returns for f/u of diabetes mellitus: DM type: Insulin-requiring type 2 Dx'ed: 2440 Complications: PN and CRI.  Therapy: insulin since 1027, and Trulicity. DKA: never Severe hypoglycemia: never.   Pancreatitis: never Pancreatic imaging: normal on 2006 CT.   SDOH: due to memory loss, he is not a candidate for multiple daily injections.   Other: he did not tolerate metformin-XR (GI sxs); he takes QD insulin, due to noncompliance; pt injects his own insulin; he declines to add SGLT. Interval history: no cbg record, but states cbg's vary from 133-279.  he says he does not miss meds.  pt states he feels well in general.   Past Medical History:  Diagnosis Date  . Abdominal pain, left lower quadrant 06/06/2010  . Abdominal pain, unspecified site 01/19/2009  . ADD 10/09/2008  . ALLERGIC RHINITIS 10/09/2008  . Allergy   . ANXIETY DEPRESSION 02/01/2008  . Arthritis    hands ?  . Blood transfusion without reported diagnosis   . Cataract    removed both eyes   . COLONIC POLYPS 02/01/2008  . DIABETES MELLITUS, TYPE II 05/20/2010  . DYSPNEA 03/12/2010  . ERECTILE DYSFUNCTION 10/09/2008  . ERECTILE DYSFUNCTION, ORGANIC 05/20/2010  . FOOT PAIN, LEFT 05/20/2010  . GERD 02/01/2008  . Guillain Barr syndrome ([place])   . HEMORRHOIDS 02/01/2008  . HIATAL HERNIA 02/01/2008  . HYPERLIPIDEMIA 10/09/2008  . HYPERTENSION 10/09/2008  . MORTON'S NEUROMA 05/20/2010  . Other specified forms of hearing loss 06/27/2009  . PERIPHERAL EDEMA 05/20/2010  . PERIPHERAL NEUROPATHY 05/20/2010  . Sleep apnea    stopped cpap   . SLEEP APNEA, OBSTRUCTIVE 02/01/2008  . Stricture and stenosis of esophagus 02/02/2008  . Type II or unspecified type diabetes mellitus without mention of complication, uncontrolled 11/14/2010  . WRIST PAIN, LEFT 12/05/2009    Past Surgical History:  Procedure Laterality Date  . CARPAL TUNNEL  RELEASE Bilateral   . COLONOSCOPY    . DENTAL SURGERY    . ESOPHAGEAL DILATION  july 2009  . ESOPHAGOGASTRODUODENOSCOPY N/A 06/27/2014   Procedure: ESOPHAGOGASTRODUODENOSCOPY (EGD);  Surgeon: Lafayette Dragon, MD;  Location: [facility];  Service: Endoscopy;  Laterality: N/A;  . EYE SURGERY     catract surgery on both eyes  . POLYPECTOMY    . ROTATOR CUFF REPAIR    . UPPER GASTROINTESTINAL ENDOSCOPY      Social History   Socioeconomic History  . Marital status: Single    Spouse name: Not on file  . Number of children: Not on file  . Years of education: Not on file  . Highest education level: Not on file  Occupational History  . Occupation: Musician: [employer]  Tobacco Use  . Smoking status: Never Smoker  . Smokeless tobacco: Never Used  Vaping Use  . Vaping Use: Never used  Substance and Sexual Activity  . Alcohol use: Yes    Comment: occasional  . Drug use: Yes    Types: Methaqualone  . Sexual activity: Yes    Comment: before I got sick  Other Topics Concern  . Not on file  Social History Narrative  . Not on file   Social Determinants of Health   Financial Resource Strain:   . Difficulty of Paying Living Expenses: Not on file  Food Insecurity:   . Worried About Charity fundraiser in  the Last Year: Not on file  . Ran Out of Food in the Last Year: Not on file  Transportation Needs:   . Lack of Transportation (Medical): Not on file  . Lack of Transportation (Non-Medical): Not on file  Physical Activity:   . Days of Exercise per Week: Not on file  . Minutes of Exercise per Session: Not on file  Stress:   . Feeling of Stress : Not on file  Social Connections:   . Frequency of Communication with Friends and Family: Not on file  . Frequency of Social Gatherings with Friends and Family: Not on file  . Attends Religious Services: Not on file  . Active Member of Clubs or Organizations: Not on file  . Attends Banker Meetings:  Not on file  . Marital Status: Not on file  Intimate Partner Violence:   . Fear of Current or Ex-Partner: Not on file  . Emotionally Abused: Not on file  . Physically Abused: Not on file  . Sexually Abused: Not on file    Current Outpatient Medications on File Prior to Visit  Medication Sig Dispense Refill  . ARIPiprazole (ABILIFY) 10 MG tablet Take 1 tablet (10 mg total) by mouth daily. 90 tablet 3  . aspirin EC 81 MG tablet Take 1 tablet (81 mg total) by mouth daily.    . Blood Glucose Monitoring Suppl (ACCU-CHEK AVIVA PLUS) w/Device KIT Apply 1 Device topically daily. Once daily E11.9 1 kit 0  . cyclobenzaprine (FLEXERIL) 5 MG tablet TAKE 1 TABLET(5 MG) BY MOUTH AT BEDTIME 30 tablet 11  . docusate calcium (SURFAK) 240 MG capsule Take 240 mg by mouth daily.    . Dulaglutide (TRULICITY) 4.5 MG/0.5ML SOPN Inject 0.5 mLs (4.5 mg total) as directed once a week. 12 pen 3  . DULoxetine (CYMBALTA) 60 MG capsule Take 1 capsule (60 mg total) by mouth 2 (two) times daily. 180 capsule 4  . famotidine (PEPCID) 40 MG tablet TAKE 1 TABLET(40 MG) BY MOUTH TWICE DAILY 180 tablet 3  . glucose blood (ACCU-CHEK AVIVA PLUS) test strip Use as instructed once daily E11.9 100 each 12  . hydrochlorothiazide (MICROZIDE) 12.5 MG capsule TAKE 1 CAPSULE(12.5 MG) BY MOUTH DAILY 90 capsule 3  . insulin degludec (TRESIBA FLEXTOUCH) 200 UNIT/ML FlexTouch Pen Inject 100 Units into the skin daily. 6 pen 11  . Insulin Pen Needle (B-D ULTRAFINE III SHORT PEN) 31G X 8 MM MISC USE AS DIRECTED WITH TRESIBA PENS DAILY 100 each 2  . Lancets MISC Apply 1 Device topically daily. Once daily E11.9 100 each 11  . LORazepam (ATIVAN) 0.5 MG tablet TAKE 1 TABLET(0.5 MG) BY MOUTH TWICE DAILY 60 tablet 3  . methylphenidate (CONCERTA) 54 MG PO CR tablet Take 1 tablet (54 mg total) by mouth every morning. 30 tablet 0  . omeprazole (PRILOSEC OTC) 20 MG tablet Take 20 mg by mouth daily.    . pregabalin (LYRICA) 200 MG capsule TAKE (1)  CAPSULE TWO TIMES DAILY. 180 capsule 1  . rosuvastatin (CRESTOR) 20 MG tablet TAKE 1 TABLET(20 MG) BY MOUTH DAILY 90 tablet 3  . tapentadol HCl (NUCYNTA) 75 MG tablet Take 1 tablet (75 mg total) by mouth 3 (three) times daily as needed. 90 tablet 0  . vitamin B-12 (CYANOCOBALAMIN) 1000 MCG tablet Take 1 tablet (1,000 mcg total) by mouth daily. 90 tablet 1  . Vitamin D, Ergocalciferol, (DRISDOL) 1.25 MG (50000 UT) CAPS capsule Take 1 capsule (50,000 Units total) by mouth every  7 (seven) days. 12 capsule 0   No current facility-administered medications on file prior to visit.    Allergies  Allergen Reactions  . Codeine Itching    REACTION: Itching  . Influenza Vaccines     Due to hx of GBS  . Metformin And Related Other (See Comments)    GI upset, diarrhea  . Other Other (See Comments)    Due to hx of GBS- vaccines   . Shellfish Allergy     Family History  Problem Relation Age of Onset  . Diabetes Mother   . Heart disease Mother   . Hyperlipidemia Mother   . Depression Mother   . Diabetes Brother   . Colon cancer Neg Hx   . Colon polyps Neg Hx   . Esophageal cancer Neg Hx   . Rectal cancer Neg Hx   . Stomach cancer Neg Hx     BP (!) 150/82   Pulse (!) 107   Ht _0  (1.702 m)   Wt 231 lb (104.8 kg)   SpO2 96%   BMI 36.18 kg/m   Review of Systems He denies hypoglycemia    Objective:   Physical Exam VITAL SIGNS:  See vs page GENERAL: no distress Pulses: dorsalis pedis intact bilat.   MSK: no deformity of the feet CV: no leg edema Skin:  no ulcer on the feet.  normal color and temp on the feet. Neuro: sensation is intact to touch on the feet, but decreased from normal Ext: there is bilateral onychomycosis of the toenails.    Lab Results  Component Value Date   CREATININE 1.11 06/08/2019   BUN 16 06/08/2019   NA 138 06/08/2019   K 4.2 06/08/2019   CL 97 06/08/2019   CO2 32 06/08/2019   Lab Results  Component Value Date   TSH 2.84 06/08/2019   Lab  Results  Component Value Date   HGBA1C 7.7 (A) 04/30/2020       Assessment & Plan:  Insulin-requiring type 2 DM, with CRI: this is the best control this pt should aim for, given this regimen, which does match insulin to his changing needs throughout the day.  He declines to add SGLT HTN: is noted today  Patient Instructions  Your blood pressure is high today.  Please see your primary care provider soon, to have it rechecked Please continue the same diabetes medications check your blood sugar once a day.  vary the time of day when you check, between before the 3 meals, and at bedtime.  also check if you have symptoms of your blood sugar being too high or too low.  please keep a record of the readings and bring it to your next appointment here (or you can bring the meter itself).  You can write it on any piece of paper.  please call us sooner if your blood sugar goes below 70, or if you have a lot of readings over 200.  Please come back for a follow-up appointment in January

## 2020-05-08 ENCOUNTER — Other Ambulatory Visit: Payer: Self-pay | Admitting: Neurology

## 2020-05-08 ENCOUNTER — Other Ambulatory Visit: Payer: Self-pay | Admitting: Endocrinology

## 2020-05-08 MED ORDER — TAPENTADOL HCL 75 MG PO TABS
75.0000 mg | ORAL_TABLET | Freq: Three times a day (TID) | ORAL | 0 refills | Status: DC | PRN
Start: 2020-05-08 — End: 2020-06-25

## 2020-05-08 NOTE — Telephone Encounter (Signed)
Pt called needing a refill on his tapentadol HCl (NUCYNTA) 75 MG tablet sent in to the [facility] on [address] [facility].

## 2020-05-29 ENCOUNTER — Other Ambulatory Visit: Payer: Self-pay | Admitting: Internal Medicine

## 2020-05-29 NOTE — Telephone Encounter (Signed)
Done erx  please to call family - needs rov

## 2020-06-13 ENCOUNTER — Encounter: Payer: Medicare Other | Admitting: Internal Medicine

## 2020-06-22 ENCOUNTER — Telehealth: Payer: Self-pay | Admitting: Internal Medicine

## 2020-06-22 NOTE — Telephone Encounter (Signed)
LVM for pt to rtn my call to schedule AWV with [facility]. Please schedule this appt if pt calls the office.  °

## 2020-06-25 ENCOUNTER — Telehealth: Payer: Self-pay

## 2020-06-25 ENCOUNTER — Other Ambulatory Visit: Payer: Self-pay | Admitting: Neurology

## 2020-06-25 ENCOUNTER — Other Ambulatory Visit: Payer: Self-pay | Admitting: *Deleted

## 2020-06-25 DIAGNOSIS — E1142 Type 2 diabetes mellitus with diabetic polyneuropathy: Secondary | ICD-10-CM

## 2020-06-25 DIAGNOSIS — IMO0002 Reserved for concepts with insufficient information to code with codable children: Secondary | ICD-10-CM

## 2020-06-25 MED ORDER — TAPENTADOL HCL 75 MG PO TABS
75.0000 mg | ORAL_TABLET | Freq: Three times a day (TID) | ORAL | 0 refills | Status: DC | PRN
Start: 2020-06-25 — End: 2020-08-07

## 2020-06-25 MED ORDER — TRESIBA FLEXTOUCH 200 UNIT/ML ~~LOC~~ SOPN: PEN_INJECTOR | SUBCUTANEOUS | 0 refills | Status: DC

## 2020-06-25 NOTE — Telephone Encounter (Signed)
Pt request refill tapentadol HCl (NUCYNTA) 75 MG tablet at [place] #[id number]

## 2020-06-25 NOTE — Telephone Encounter (Signed)
Checked drug registry. He last refilled 05/11/20 #90. Last seen 04/09/20 and next f/u 08/13/20.

## 2020-06-25 NOTE — Telephone Encounter (Signed)
New message     1. Which medications need to be refilled? (please list name of each medication and dose if known)   TRESIBA FLEXTOUCH 200 UNIT/ML FlexTouch Pen  2. Which pharmacy/location (including street and city if local pharmacy) is medication to be sent to? [facility] on [address]

## 2020-06-25 NOTE — Telephone Encounter (Signed)
Refilled--[facility]

## 2020-06-26 ENCOUNTER — Telehealth: Payer: Self-pay | Admitting: *Deleted

## 2020-06-26 NOTE — Telephone Encounter (Signed)
Gave completed/signed DMV parking placard form back to medical records to process for pt. 

## 2020-06-27 ENCOUNTER — Telehealth: Payer: Self-pay

## 2020-06-27 ENCOUNTER — Other Ambulatory Visit: Payer: Self-pay | Admitting: Internal Medicine

## 2020-06-27 NOTE — Telephone Encounter (Signed)
Left vm with patient that his form was ready to be picked up

## 2020-06-27 NOTE — Telephone Encounter (Signed)
Please refill as per office routine med refill policy (all routine meds refilled for 3 mo or monthly per pt preference up to one year from last visit, then month to month grace period for 3 mo, then further med refills will have to be denied)  

## 2020-07-02 ENCOUNTER — Other Ambulatory Visit: Payer: Self-pay

## 2020-07-02 ENCOUNTER — Ambulatory Visit (INDEPENDENT_AMBULATORY_CARE_PROVIDER_SITE_OTHER): Payer: Medicare PPO | Admitting: Internal Medicine

## 2020-07-02 ENCOUNTER — Encounter: Payer: Self-pay | Admitting: Internal Medicine

## 2020-07-02 VITALS — BP 138/82 | HR 98 | Temp 98.3°F | Ht 68.0 in | Wt 228.4 lb

## 2020-07-02 DIAGNOSIS — E1142 Type 2 diabetes mellitus with diabetic polyneuropathy: Secondary | ICD-10-CM | POA: Diagnosis not present

## 2020-07-02 DIAGNOSIS — E785 Hyperlipidemia, unspecified: Secondary | ICD-10-CM

## 2020-07-02 DIAGNOSIS — F988 Other specified behavioral and emotional disorders with onset usually occurring in childhood and adolescence: Secondary | ICD-10-CM | POA: Diagnosis not present

## 2020-07-02 DIAGNOSIS — IMO0002 Reserved for concepts with insufficient information to code with codable children: Secondary | ICD-10-CM

## 2020-07-02 DIAGNOSIS — I10 Essential (primary) hypertension: Secondary | ICD-10-CM | POA: Diagnosis not present

## 2020-07-02 DIAGNOSIS — E538 Deficiency of other specified B group vitamins: Secondary | ICD-10-CM | POA: Diagnosis not present

## 2020-07-02 DIAGNOSIS — E1165 Type 2 diabetes mellitus with hyperglycemia: Secondary | ICD-10-CM

## 2020-07-02 DIAGNOSIS — F341 Dysthymic disorder: Secondary | ICD-10-CM

## 2020-07-02 DIAGNOSIS — Z0000 Encounter for general adult medical examination without abnormal findings: Secondary | ICD-10-CM

## 2020-07-02 DIAGNOSIS — E559 Vitamin D deficiency, unspecified: Secondary | ICD-10-CM | POA: Diagnosis not present

## 2020-07-02 LAB — CBC WITH DIFFERENTIAL/PLATELET
Basophils Absolute: 0 10*3/uL (ref 0.0–0.1)
Basophils Relative: 0.6 % (ref 0.0–3.0)
Eosinophils Absolute: 0.1 10*3/uL (ref 0.0–0.7)
Eosinophils Relative: 1.3 % (ref 0.0–5.0)
HCT: 43.8 % (ref 39.0–52.0)
Hemoglobin: 15.2 g/dL (ref 13.0–17.0)
Lymphocytes Relative: 37.1 % (ref 12.0–46.0)
Lymphs Abs: 3 10*3/uL (ref 0.7–4.0)
MCHC: 34.7 g/dL (ref 30.0–36.0)
MCV: 86.3 fl (ref 78.0–100.0)
Monocytes Absolute: 0.6 10*3/uL (ref 0.1–1.0)
Monocytes Relative: 6.7 % (ref 3.0–12.0)
Neutro Abs: 4.4 10*3/uL (ref 1.4–7.7)
Neutrophils Relative %: 54.3 % (ref 43.0–77.0)
Platelets: 226 10*3/uL (ref 150.0–400.0)
RBC: 5.08 Mil/uL (ref 4.22–5.81)
RDW: 14 % (ref 11.5–15.5)
WBC: 8.2 10*3/uL (ref 4.0–10.5)

## 2020-07-02 LAB — HEPATIC FUNCTION PANEL
ALT: 17 U/L (ref 0–53)
AST: 17 U/L (ref 0–37)
Albumin: 4.3 g/dL (ref 3.5–5.2)
Alkaline Phosphatase: 76 U/L (ref 39–117)
Bilirubin, Direct: 0.1 mg/dL (ref 0.0–0.3)
Total Bilirubin: 0.7 mg/dL (ref 0.2–1.2)
Total Protein: 7.4 g/dL (ref 6.0–8.3)

## 2020-07-02 LAB — LIPID PANEL
Cholesterol: 180 mg/dL (ref 0–200)
HDL: 52.5 mg/dL (ref >39.00)
NonHDL: 127.03
Total CHOL/HDL Ratio: 3
Triglycerides: 317 mg/dL — ABNORMAL HIGH (ref 0.0–149.0)
VLDL: 63.4 mg/dL — ABNORMAL HIGH (ref 0.0–40.0)

## 2020-07-02 LAB — BASIC METABOLIC PANEL
BUN: 17 mg/dL (ref 6–23)
CO2: 30 mEq/L (ref 19–32)
Calcium: 9.7 mg/dL (ref 8.4–10.5)
Chloride: 100 mEq/L (ref 96–112)
Creatinine, Ser: 1.06 mg/dL (ref 0.40–1.50)
GFR: 72.94 mL/min (ref >60.00)
Glucose, Bld: 155 mg/dL — ABNORMAL HIGH (ref 70–99)
Potassium: 3.9 mEq/L (ref 3.5–5.1)
Sodium: 139 mEq/L (ref 135–145)

## 2020-07-02 LAB — VITAMIN D 25 HYDROXY (VIT D DEFICIENCY, FRACTURES): VITD: 17.64 ng/mL — ABNORMAL LOW (ref 30.00–100.00)

## 2020-07-02 LAB — VITAMIN B12: Vitamin B-12: 177 pg/mL — ABNORMAL LOW (ref 211–911)

## 2020-07-02 LAB — TSH: TSH: 1.8 u[IU]/mL (ref 0.35–4.50)

## 2020-07-02 MED ORDER — LORAZEPAM 0.5 MG PO TABS
ORAL_TABLET | ORAL | 5 refills | Status: DC
Start: 2020-07-02 — End: 2021-01-14

## 2020-07-02 MED ORDER — METHYLPHENIDATE HCL ER (OSM) 54 MG PO TBCR
54.0000 mg | EXTENDED_RELEASE_TABLET | ORAL | 0 refills | Status: DC
Start: 2020-07-02 — End: 2021-06-03

## 2020-07-02 NOTE — Patient Instructions (Signed)
Please continue all other medications as before, and refills have been done if requested.  Please have the pharmacy call with any other refills you may need.  Please continue your efforts at being more active, low cholesterol diet, and weight control.  You are otherwise up to date with prevention measures today.  Please keep your appointments with your specialists as you may have planned  Please go to the LAB at the blood drawing area for the tests to be done  You will be contacted by phone if any changes need to be made immediately.  Otherwise, you will receive a letter about your results with an explanation, but please check with MyChart first.  Please remember to sign up for MyChart if you have not done so, as this will be important to you in the future with finding out test results, communicating by private email, and scheduling acute appointments online when needed.  Please make an Appointment to return for your 1 year visit, or sooner if needed  

## 2020-07-02 NOTE — Progress Notes (Signed)
Subjective:    Patient ID: Frank [name], male    DOB: May 06, 1956, 64 y.o.   MRN: [id number]  HPI  Here for wellness and f/u;  Overall doing ok;  Pt denies Chest pain, worsening SOB, DOE, wheezing, orthopnea, PND, worsening LE edema, palpitations, dizziness or syncope.  Pt denies neurological change such as new headache, facial or extremity weakness.  Pt denies polydipsia, polyuria, or low sugar symptoms. Pt states overall good compliance with treatment and medications, good tolerability, and has been trying to follow appropriate diet.  Pt denies worsening depressive symptoms, suicidal ideation or panic. No fever, night sweats, wt loss, loss of appetite, or other constitutional symptoms.  Pt states good ability with ADL's, has low fall risk, home safety reviewed and adequate, no other significant changes in hearing or vision, and only occasionally active with exercise.  Plans to make appt with Dr Katy Fitch for eyes soon. Denies worsening depressive symptoms, suicidal ideation, or panic.  Past Medical History:  Diagnosis Date  . Abdominal pain, left lower quadrant 06/06/2010  . Abdominal pain, unspecified site 01/19/2009  . ADD 10/09/2008  . ALLERGIC RHINITIS 10/09/2008  . Allergy   . ANXIETY DEPRESSION 02/01/2008  . Arthritis    hands ?  . Blood transfusion without reported diagnosis   . Cataract    removed both eyes   . COLONIC POLYPS 02/01/2008  . DIABETES MELLITUS, TYPE II 05/20/2010  . DYSPNEA 03/12/2010  . ERECTILE DYSFUNCTION 10/09/2008  . ERECTILE DYSFUNCTION, ORGANIC 05/20/2010  . FOOT PAIN, LEFT 05/20/2010  . GERD 02/01/2008  . Guillain Barr syndrome ([place])   . HEMORRHOIDS 02/01/2008  . HIATAL HERNIA 02/01/2008  . HYPERLIPIDEMIA 10/09/2008  . HYPERTENSION 10/09/2008  . MORTON'S NEUROMA 05/20/2010  . Other specified forms of hearing loss 06/27/2009  . PERIPHERAL EDEMA 05/20/2010  . PERIPHERAL NEUROPATHY 05/20/2010  . Sleep apnea    stopped cpap   . SLEEP APNEA, OBSTRUCTIVE 02/01/2008    . Stricture and stenosis of esophagus 02/02/2008  . Type II or unspecified type diabetes mellitus without mention of complication, uncontrolled 11/14/2010  . WRIST PAIN, LEFT 12/05/2009   Past Surgical History:  Procedure Laterality Date  . CARPAL TUNNEL RELEASE Bilateral   . COLONOSCOPY    . DENTAL SURGERY    . ESOPHAGEAL DILATION  july 2009  . ESOPHAGOGASTRODUODENOSCOPY N/A 06/27/2014   Procedure: ESOPHAGOGASTRODUODENOSCOPY (EGD);  Surgeon: Lafayette Dragon, MD;  Location: [facility];  Service: Endoscopy;  Laterality: N/A;  . EYE SURGERY     catract surgery on both eyes  . POLYPECTOMY    . ROTATOR CUFF REPAIR    . UPPER GASTROINTESTINAL ENDOSCOPY      reports that he has never smoked. He has never used smokeless tobacco. He reports current alcohol use. He reports current drug use. Drug: Methaqualone. family history includes Depression in his mother; Diabetes in his brother and mother; Heart disease in his mother; Hyperlipidemia in his mother. Allergies  Allergen Reactions  . Codeine Itching    REACTION: Itching  . Influenza Vaccines     Due to hx of GBS  . Metformin And Related Other (See Comments)    GI upset, diarrhea  . Other Other (See Comments)    Due to hx of GBS- vaccines   . Shellfish Allergy    Current Outpatient Medications on File Prior to Visit  Medication Sig Dispense Refill  . ARIPiprazole (ABILIFY) 10 MG tablet Take 1 tablet (10 mg total) by mouth daily. 90 tablet 3  .  aspirin EC 81 MG tablet Take 1 tablet (81 mg total) by mouth daily.    . Blood Glucose Monitoring Suppl (ACCU-CHEK AVIVA PLUS) w/Device KIT Apply 1 Device topically daily. Once daily E11.9 1 kit 0  . cyclobenzaprine (FLEXERIL) 5 MG tablet TAKE 1 TABLET(5 MG) BY MOUTH AT BEDTIME 30 tablet 11  . docusate calcium (SURFAK) 240 MG capsule Take 240 mg by mouth daily.    . Dulaglutide (TRULICITY) 4.5 FU/9.3AT SOPN Inject 0.5 mLs (4.5 mg total) as directed once a week. 12 pen 3  . DULoxetine (CYMBALTA) 60  MG capsule Take 1 capsule (60 mg total) by mouth 2 (two) times daily. 180 capsule 4  . famotidine (PEPCID) 40 MG tablet TAKE 1 TABLET(40 MG) BY MOUTH TWICE DAILY 180 tablet 3  . glucose blood (ACCU-CHEK AVIVA PLUS) test strip Use as instructed once daily E11.9 100 each 12  . hydrochlorothiazide (MICROZIDE) 12.5 MG capsule TAKE 1 CAPSULE(12.5 MG) BY MOUTH DAILY 90 capsule 3  . insulin degludec (TRESIBA FLEXTOUCH) 200 UNIT/ML FlexTouch Pen ADMINISTER 90 UNITS UNDER THE SKIN DAILY 18 mL 0  . Insulin Pen Needle (B-D ULTRAFINE III SHORT PEN) 31G X 8 MM MISC USE AS DIRECTED WITH TRESIBA PENS DAILY 100 each 2  . Lancets MISC Apply 1 Device topically daily. Once daily E11.9 100 each 11  . omeprazole (PRILOSEC OTC) 20 MG tablet Take 20 mg by mouth daily.    . pregabalin (LYRICA) 200 MG capsule TAKE (1) CAPSULE TWO TIMES DAILY. 180 capsule 1  . rosuvastatin (CRESTOR) 20 MG tablet TAKE 1 TABLET(20 MG) BY MOUTH DAILY 90 tablet 3  . tapentadol HCl (NUCYNTA) 75 MG tablet Take 1 tablet (75 mg total) by mouth 3 (three) times daily as needed. 90 tablet 0  . vitamin B-12 (CYANOCOBALAMIN) 1000 MCG tablet Take 1 tablet (1,000 mcg total) by mouth daily. 90 tablet 1  . Vitamin D, Ergocalciferol, (DRISDOL) 1.25 MG (50000 UT) CAPS capsule Take 1 capsule (50,000 Units total) by mouth every 7 (seven) days. 12 capsule 0   No current facility-administered medications on file prior to visit.   Review of Systems All otherwise neg per pt=    Objective:   Physical Exam BP 138/82 (BP Location: Left Arm, Patient Position: Sitting, Cuff Size: Large)   Pulse 98   Temp 98.3 F (36.8 C) (Oral)   Ht '5\' 8"'  (1.727 m)   Wt 228 lb 6.4 oz (103.6 kg)   SpO2 96%   BMI 34.73 kg/m  VS noted,  Constitutional: Pt appears in NAD HENT: Head: NCAT.  Right Ear: External ear normal.  Left Ear: External ear normal.  Eyes: . Pupils are equal, round, and reactive to light. Conjunctivae and EOM are normal Nose: without d/c or  deformity Neck: Neck supple. Gross normal ROM Cardiovascular: Normal rate and regular rhythm.   Pulmonary/Chest: Effort normal and breath sounds without rales or wheezing.  Abd:  Soft, NT, ND, + BS, no organomegaly Neurological: Pt is alert. At baseline orientation, motor grossly intact Skin: Skin is warm. No rashes, other new lesions, no LE edema Psychiatric: Pt behavior is normal without agitation  All otherwise neg per pt  Lab Results  Component Value Date   WBC 10.9 (H) 06/08/2019   HGB 15.9 06/08/2019   HCT 46.0 06/08/2019   PLT 208.0 06/08/2019   GLUCOSE 199 (H) 06/08/2019   CHOL 141 06/08/2019   TRIG 282.0 (H) 06/08/2019   HDL 45.30 06/08/2019   LDLDIRECT 57.0 06/08/2019  [place] 23 11/16/2017   ALT 19 06/08/2019   AST 19 06/08/2019   NA 138 06/08/2019   K 4.2 06/08/2019   CL 97 06/08/2019   CREATININE 1.11 06/08/2019   BUN 16 06/08/2019   CO2 32 06/08/2019   TSH 2.84 06/08/2019   PSA 0.30 06/08/2019   HGBA1C 7.7 (A) 04/30/2020   MICROALBUR <0.7 11/16/2017       Assessment & Plan:

## 2020-07-02 NOTE — Assessment & Plan Note (Signed)
Followed per endo, cont same tx, stalbe

## 2020-07-03 ENCOUNTER — Encounter: Payer: Self-pay | Admitting: Internal Medicine

## 2020-07-03 LAB — MICROALBUMIN / CREATININE URINE RATIO
Creatinine,U: 121.5 mg/dL
Microalb Creat Ratio: 1.3 mg/g (ref 0.0–30.0)
Microalb, Ur: 1.6 mg/dL (ref 0.0–1.9)

## 2020-07-03 LAB — URINALYSIS, ROUTINE W REFLEX MICROSCOPIC
Bilirubin Urine: NEGATIVE
Hgb urine dipstick: NEGATIVE
Ketones, ur: NEGATIVE
Leukocytes,Ua: NEGATIVE
Nitrite: NEGATIVE
Specific Gravity, Urine: 1.02 (ref 1.000–1.030)
Total Protein, Urine: NEGATIVE
Urine Glucose: 100 — AB
Urobilinogen, UA: 0.2 (ref 0.0–1.0)
pH: 7.5 (ref 5.0–8.0)

## 2020-07-03 LAB — LDL CHOLESTEROL, DIRECT: Direct LDL: 94 mg/dL

## 2020-07-08 ENCOUNTER — Encounter: Payer: Self-pay | Admitting: Internal Medicine

## 2020-07-08 NOTE — Assessment & Plan Note (Signed)
stable overall by history and exam, recent data reviewed with pt, and pt to continue medical treatment as before,  to f/u any worsening symptoms or concerns  

## 2020-07-08 NOTE — Assessment & Plan Note (Signed)

## 2020-08-01 ENCOUNTER — Other Ambulatory Visit: Payer: Self-pay | Admitting: Endocrinology

## 2020-08-01 DIAGNOSIS — IMO0002 Reserved for concepts with insufficient information to code with codable children: Secondary | ICD-10-CM

## 2020-08-07 ENCOUNTER — Other Ambulatory Visit: Payer: Self-pay | Admitting: Neurology

## 2020-08-07 MED ORDER — TAPENTADOL HCL 75 MG PO TABS: 75.0000 mg | ORAL_TABLET | Freq: Three times a day (TID) | ORAL | 0 refills | Status: DC | PRN

## 2020-08-07 NOTE — Telephone Encounter (Signed)
Pt is needing a refill on his tapentadol HCl (NUCYNTA) 75 MG tablet sent in to the [facility] on [address]

## 2020-08-09 ENCOUNTER — Ambulatory Visit: Payer: Medicare PPO | Admitting: Family Medicine

## 2020-08-13 ENCOUNTER — Ambulatory Visit: Payer: Medicare PPO | Admitting: Family Medicine

## 2020-09-03 ENCOUNTER — Ambulatory Visit: Payer: Medicare PPO | Admitting: Endocrinology

## 2020-09-03 ENCOUNTER — Other Ambulatory Visit: Payer: Self-pay

## 2020-09-03 ENCOUNTER — Other Ambulatory Visit: Payer: Self-pay | Admitting: Neurology

## 2020-09-03 VITALS — BP 134/80 | HR 109 | Ht 69.0 in | Wt 226.0 lb

## 2020-09-03 DIAGNOSIS — E1142 Type 2 diabetes mellitus with diabetic polyneuropathy: Secondary | ICD-10-CM

## 2020-09-03 DIAGNOSIS — E1165 Type 2 diabetes mellitus with hyperglycemia: Secondary | ICD-10-CM

## 2020-09-03 DIAGNOSIS — IMO0002 Reserved for concepts with insufficient information to code with codable children: Secondary | ICD-10-CM

## 2020-09-03 LAB — POCT GLYCOSYLATED HEMOGLOBIN (HGB A1C): Hemoglobin A1C: 8.4 % — AB (ref 4.0–5.6)

## 2020-09-03 MED ORDER — OZEMPIC (1 MG/DOSE) 2 MG/1.5ML ~~LOC~~ SOPN: 1.0000 mg | PEN_INJECTOR | SUBCUTANEOUS | 3 refills | Status: DC

## 2020-09-03 MED ORDER — DAPAGLIFLOZIN PROPANEDIOL 5 MG PO TABS: 5.0000 mg | ORAL_TABLET | Freq: Every day | ORAL | 3 refills | Status: DC

## 2020-09-03 MED ORDER — BD PEN NEEDLE SHORT U/F 31G X 8 MM MISC: 2 refills | Status: DC

## 2020-09-03 NOTE — Patient Instructions (Addendum)
I have sent 2 prescriptions to your pharmacy: to change Trulicity to Ozempic, to see if that is cheaper, and to add "[place]." If the [place] is expensive, please call so we can instead increase the insulin. check your blood sugar once a day.  vary the time of day when you check, between before the 3 meals, and at bedtime.  also check if you have symptoms of your blood sugar being too high or too low.  please keep a record of the readings and bring it to your next appointment here (or you can bring the meter itself).  You can write it on any piece of paper.  please call us sooner if your blood sugar goes below 70, or if you have a lot of readings over 200.  Please come back for a follow-up appointment in 2 months.

## 2020-09-03 NOTE — Progress Notes (Signed)
Subjective:    Patient ID: Frank [name], male    DOB: July 07, 1956, 65 y.o.   MRN: [id number]  HPI Pt returns for f/u of diabetes mellitus: DM type: Insulin-requiring type 2 Dx'ed: 0000000 Complications: PN and CRI.  Therapy: insulin since [id number], and Trulicity. DKA: never Severe hypoglycemia: never.   Pancreatitis: never Pancreatic imaging: normal on 2006 CT.   SDOH: due to memory loss, he is not a candidate for multiple daily injections.   Other: he did not tolerate metformin-XR (GI sxs); he takes QD insulin, due to noncompliance; pt injects his own insulin; he declines to add SGLT. Interval history: He brings a record of his cbg's which I have reviewed today.  He checks fasting only.  cbg's vary from 107-358.  he says he does not miss meds.  pt states he feels well in general.  Pt says he can no longer afford Trulicity (but he is on as of now).   Past Medical History:  Diagnosis Date  . Abdominal pain, left lower quadrant 06/06/2010  . Abdominal pain, unspecified site 01/19/2009  . ADD 10/09/2008  . ALLERGIC RHINITIS 10/09/2008  . Allergy   . ANXIETY DEPRESSION 02/01/2008  . Arthritis    hands ?  . Blood transfusion without reported diagnosis   . Cataract    removed both eyes   . COLONIC POLYPS 02/01/2008  . DIABETES MELLITUS, TYPE II 05/20/2010  . DYSPNEA 03/12/2010  . ERECTILE DYSFUNCTION 10/09/2008  . ERECTILE DYSFUNCTION, ORGANIC 05/20/2010  . FOOT PAIN, LEFT 05/20/2010  . GERD 02/01/2008  . Guillain Barr syndrome ([place])   . HEMORRHOIDS 02/01/2008  . HIATAL HERNIA 02/01/2008  . HYPERLIPIDEMIA 10/09/2008  . HYPERTENSION 10/09/2008  . MORTON'S NEUROMA 05/20/2010  . Other specified forms of hearing loss 06/27/2009  . PERIPHERAL EDEMA 05/20/2010  . PERIPHERAL NEUROPATHY 05/20/2010  . Sleep apnea    stopped cpap   . SLEEP APNEA, OBSTRUCTIVE 02/01/2008  . Stricture and stenosis of esophagus 02/02/2008  . Type II or unspecified type diabetes mellitus without mention of complication,  uncontrolled 11/14/2010  . WRIST PAIN, LEFT 12/05/2009    Past Surgical History:  Procedure Laterality Date  . CARPAL TUNNEL RELEASE Bilateral   . COLONOSCOPY    . DENTAL SURGERY    . ESOPHAGEAL DILATION  july 2009  . ESOPHAGOGASTRODUODENOSCOPY N/A 06/27/2014   Procedure: ESOPHAGOGASTRODUODENOSCOPY (EGD);  Surgeon: Lafayette Dragon, MD;  Location: [facility];  Service: Endoscopy;  Laterality: N/A;  . EYE SURGERY     catract surgery on both eyes  . POLYPECTOMY    . ROTATOR CUFF REPAIR    . UPPER GASTROINTESTINAL ENDOSCOPY      Social History   Socioeconomic History  . Marital status: Single    Spouse name: Not on file  . Number of children: Not on file  . Years of education: Not on file  . Highest education level: Not on file  Occupational History  . Occupation: Musician: [employer]  Tobacco Use  . Smoking status: Never Smoker  . Smokeless tobacco: Never Used  Vaping Use  . Vaping Use: Never used  Substance and Sexual Activity  . Alcohol use: Yes    Comment: occasional  . Drug use: Yes    Types: Methaqualone  . Sexual activity: Yes    Comment: before I got sick  Other Topics Concern  . Not on file  Social History Narrative  . Not on file   Social Determinants of Health  Financial Resource Strain: Not on file  Food Insecurity: Not on file  Transportation Needs: Not on file  Physical Activity: Not on file  Stress: Not on file  Social Connections: Not on file  Intimate Partner Violence: Not on file    Current Outpatient Medications on File Prior to Visit  Medication Sig Dispense Refill  . aspirin EC 81 MG tablet Take 1 tablet (81 mg total) by mouth daily.    . cyclobenzaprine (FLEXERIL) 5 MG tablet TAKE 1 TABLET(5 MG) BY MOUTH AT BEDTIME 30 tablet 11  . docusate calcium (SURFAK) 240 MG capsule Take 240 mg by mouth daily.    . DULoxetine (CYMBALTA) 60 MG capsule Take 1 capsule (60 mg total) by mouth 2 (two) times daily. 180 capsule 4  .  famotidine (PEPCID) 40 MG tablet TAKE 1 TABLET(40 MG) BY MOUTH TWICE DAILY 180 tablet 3  . glucose blood (ACCU-CHEK AVIVA PLUS) test strip Use as instructed once daily E11.9 100 each 12  . hydrochlorothiazide (MICROZIDE) 12.5 MG capsule TAKE 1 CAPSULE(12.5 MG) BY MOUTH DAILY 90 capsule 3  . Lancets MISC Apply 1 Device topically daily. Once daily E11.9 100 each 11  . LORazepam (ATIVAN) 0.5 MG tablet 1 tab by mouth twice per day as needed 60 tablet 5  . methylphenidate (CONCERTA) 54 MG PO CR tablet Take 1 tablet (54 mg total) by mouth every morning. 30 tablet 0  . omeprazole (PRILOSEC OTC) 20 MG tablet Take 20 mg by mouth daily.    . pregabalin (LYRICA) 200 MG capsule TAKE (1) CAPSULE TWO TIMES DAILY. 180 capsule 1  . rosuvastatin (CRESTOR) 20 MG tablet TAKE 1 TABLET(20 MG) BY MOUTH DAILY 90 tablet 3  . tapentadol HCl (NUCYNTA) 75 MG tablet Take 1 tablet (75 mg total) by mouth 3 (three) times daily as needed. 90 tablet 0  . TRESIBA FLEXTOUCH 200 UNIT/ML FlexTouch Pen ADMINISTER 90 UNITS UNDER THE SKIN DAILY 18 mL 0  . vitamin B-12 (CYANOCOBALAMIN) 1000 MCG tablet Take 1 tablet (1,000 mcg total) by mouth daily. 90 tablet 1  . Vitamin D, Ergocalciferol, (DRISDOL) 1.25 MG (50000 UT) CAPS capsule Take 1 capsule (50,000 Units total) by mouth every 7 (seven) days. 12 capsule 0   No current facility-administered medications on file prior to visit.    Allergies  Allergen Reactions  . Codeine Itching    REACTION: Itching  . Influenza Vaccines     Due to hx of GBS  . Metformin And Related Other (See Comments)    GI upset, diarrhea  . Other Other (See Comments)    Due to hx of GBS- vaccines   . Shellfish Allergy     Family History  Problem Relation Age of Onset  . Diabetes Mother   . Heart disease Mother   . Hyperlipidemia Mother   . Depression Mother   . Diabetes Brother   . Colon cancer Neg Hx   . Colon polyps Neg Hx   . Esophageal cancer Neg Hx   . Rectal cancer Neg Hx   . Stomach  cancer Neg Hx     BP 134/80 (BP Location: Right Arm, Patient Position: Sitting, Cuff Size: Large)   Pulse (!) 109   Ht 5\' 9"  (1.753 m)   Wt 226 lb (102.5 kg)   SpO2 96%   BMI 33.37 kg/m    Review of Systems He denies hypoglycemia.      Objective:   Physical Exam VITAL SIGNS:  See vs page GENERAL: no distress  Pulses: dorsalis pedis intact bilat.   MSK: no deformity of the feet CV: no leg edema Skin:  no ulcer on the feet.  normal color and temp on the feet. Neuro: sensation is intact to touch on the feet, but decreased from normal Ext: there is bilateral onychomycosis of the toenails.    Lab Results  Component Value Date   HGBA1C 8.4 (A) 09/03/2020   Lab Results  Component Value Date   CREATININE 1.06 07/02/2020   BUN 17 07/02/2020   NA 139 07/02/2020   K 3.9 07/02/2020   CL 100 07/02/2020   CO2 30 07/02/2020      Assessment & Plan:  Insulin-requiring type 2 DM: uncontrolled.   Patient Instructions  I have sent 2 prescriptions to your pharmacy: to change Trulicity to Ozempic, to see if that is cheaper, and to add "[place]." If the [place] is expensive, please call so we can instead increase the insulin. check your blood sugar once a day.  vary the time of day when you check, between before the 3 meals, and at bedtime.  also check if you have symptoms of your blood sugar being too high or too low.  please keep a record of the readings and bring it to your next appointment here (or you can bring the meter itself).  You can write it on any piece of paper.  please call us sooner if your blood sugar goes below 70, or if you have a lot of readings over 200.  Please come back for a follow-up appointment in 2 months.

## 2020-09-10 ENCOUNTER — Ambulatory Visit: Payer: Medicare PPO | Admitting: Family Medicine

## 2020-09-10 ENCOUNTER — Encounter: Payer: Self-pay | Admitting: Family Medicine

## 2020-09-10 VITALS — BP 128/84 | HR 109 | Ht 69.0 in | Wt 222.0 lb

## 2020-09-10 DIAGNOSIS — G894 Chronic pain syndrome: Secondary | ICD-10-CM | POA: Diagnosis not present

## 2020-09-10 DIAGNOSIS — E1142 Type 2 diabetes mellitus with diabetic polyneuropathy: Secondary | ICD-10-CM | POA: Diagnosis not present

## 2020-09-10 DIAGNOSIS — G4733 Obstructive sleep apnea (adult) (pediatric): Secondary | ICD-10-CM | POA: Diagnosis not present

## 2020-09-10 NOTE — Progress Notes (Signed)
Prescription fax with confirmation received by fax.[phone], [phone]. to [facility] for neuropathy cream.

## 2020-09-10 NOTE — Patient Instructions (Signed)
Below is our plan:  We will continue Nucynta 75mg  three times daily, duloxetine 60mg  twice daily pregabilin 200mg  twice daily. Please be careful when taking these medications and try to avoid taking at the same time you take lorazepam. I will try to get a compounded neuropathy cream for you. You will get a call from [facility]. I will also ask about consideration of the capsaicin patch treatment. I will call you with any updated information regarding this treatment option.   Please make sure you are staying well hydrated. I recommend 50-60 ounces daily. Well balanced diet and regular exercise encouraged.    Please continue follow up with care team as directed.   Follow up with Dr [name] in 4 months   You may receive a survey regarding today's visit. I encourage you to leave honest feed back as I do use this information to improve patient care. Thank you for seeing me today!      Diabetic Neuropathy Diabetic neuropathy refers to nerve damage that is caused by diabetes. Over time, people with diabetes can develop nerve damage throughout the body. There are several types of diabetic neuropathy:  Peripheral neuropathy. This is the most common type of diabetic neuropathy. It damages the nerves that carry signals between the spinal cord and other parts of the body (peripheral nerves). This usually affects nerves in the feet, legs, hands, and arms.  Autonomic neuropathy. This type causes damage to nerves that control involuntary functions (autonomic nerves). Involuntary functions are functions of the body that you do not control. They include heartbeat, body temperature, blood pressure, urination, digestion, sweating, sexual function, or response to changes in blood glucose.  Focal neuropathy. This type of nerve damage affects one area of the body, such as an arm, a leg, or the face. The injury may involve one nerve or a small group of nerves. Focal neuropathy can be painful and  unpredictable. It occurs most often in older adults with diabetes. This often develops suddenly, but usually improves over time and does not cause long-term problems.  Proximal neuropathy. This type of nerve damage affects the nerves of the thighs, hips, buttocks, or legs. It causes severe pain, weakness, and muscle death (atrophy), usually in the thigh muscles. It is more common among older men and people who have type 2 diabetes. The length of recovery time may vary. What are the causes? Peripheral, autonomic, and focal neuropathies are caused by diabetes that is not well controlled with treatment. The cause of proximal neuropathy is not known, but it may be caused by inflammation related to uncontrolled blood glucose levels. What are the signs or symptoms? Peripheral neuropathy Peripheral neuropathy develops slowly over time. When the nerves of the feet and legs no longer work, you may experience:  Burning, stabbing, or aching pain in the legs or feet.  Pain or cramping in the legs or feet.  Loss of feeling (numbness) and inability to feel pressure or pain in the feet. This can lead to: ? Thick calluses or sores on areas of constant pressure. ? Ulcers. ? Reduced ability to feel temperature changes.  Foot deformities.  Muscle weakness.  Loss of balance or coordination. Autonomic neuropathy The symptoms of autonomic neuropathy vary depending on which nerves are affected. Symptoms may include:  Problems with digestion, such as: ? Nausea or vomiting. ? Poor appetite. ? Bloating. ? Diarrhea or constipation. ? Trouble swallowing. ? Losing weight without trying to.  Problems with the heart, blood, and lungs, such as: ?  Dizziness, especially when standing up. ? Fainting. ? Shortness of breath. ? Irregular heartbeat.  Bladder problems, such as: ? Trouble starting or stopping urination. ? Leaking urine. ? Trouble emptying the bladder. ? Urinary tract infections  (UTIs).  Problems with other body functions, such as: ? Sweat. You may sweat too much or too little. ? Temperature. You might get hot easily. Or, you might feel cold more than usual. ? Sexual function. Men may not be able to get or maintain an erection. Women may have vaginal dryness and difficulty with arousal. Focal neuropathy Symptoms affect only one area of the body. Common symptoms include:  Numbness.  Tingling.  Burning pain.  Prickling feeling.  Very sensitive skin.  Weakness.  Inability to move (paralysis).  Muscle twitching.  Muscles getting smaller (wasting).  Poor coordination.  Double or blurred vision. Proximal neuropathy  Sudden, severe pain in the hip, thigh, or buttocks. Pain may spread from the back into the legs (sciatica).  Pain and numbness in the arms and legs.  Tingling.  Loss of bladder control or bowel control.  Weakness and wasting of thigh muscles.  Difficulty getting up from a seated position.  Abdominal swelling.  Unexplained weight loss. How is this diagnosed? Diagnosis varies depending on the type of neuropathy your health care provider suspects. Peripheral neuropathy Your health care provider will do a neurologic exam. This exam checks your reflexes, how you move, and what you can feel. You may have other tests, such as:  Blood tests.  Tests of the fluid that surrounds the spinal cord (lumbar puncture).  CT scan.  MRI.  Checking the nerves that control muscles (electromyogram, or EMG).  Checking how quickly signals pass through your nerves (nerve conduction study).  Checking a small piece of a nerve using a microscope (biopsy). Autonomic neuropathy You may have tests, such as:  Tests to measure your blood pressure and heart rate. You may be secured to an exam table that moves you from a lying position to an upright position (table tilt test).  Breathing tests to check your lungs.  Tests to check how food moves  through the digestive system (gastric emptying tests).  Blood, sweat, or urine tests.  Ultrasound of your bladder.  Spinal fluid tests. Focal neuropathy This condition may be diagnosed with:  A neurologic exam.  CT scan.  MRI.  EMG.  Nerve conduction study. Proximal neuropathy There is no test to diagnose this type of neuropathy. You may have tests to rule out other possible causes of this type of neuropathy. Tests may include:  X-rays of your spine and lumbar region.  Lumbar puncture.  MRI. How is this treated? The goal of treatment is to keep nerve damage from getting worse. Treatment may include:  Following your diabetes management plan. This will help keep your blood glucose level and your A1C level within your target range. This is the most important treatment.  Using prescription pain medicine. Follow these instructions at home: Diabetes management Follow your diabetes management plan as told by your health care provider.  Check your blood glucose levels.  Keep your blood glucose in your target range.  Have your A1C level checked at least two times a year, or as often as told.  Take over the counter and prescription medicines only as told by your health care provider. This includes insulin and diabetes medicine.   Lifestyle  Do not use any products that contain nicotine or tobacco, such as cigarettes, e-cigarettes, and chewing tobacco. If  you need help quitting, ask your health care provider.  Be physically active every day. Include strength training and balance exercises.  Follow a healthy meal plan.  Work with your health care provider to manage your blood pressure.   General instructions  Ask your health care provider if the medicine prescribed to you requires you to avoid driving or using machinery.  Check your skin and feet every day for cuts, bruises, redness, blisters, or sores.  Keep all follow-up visits. This is important. Contact a health  care provider if:  You have burning, stabbing, or aching pain in your legs or feet.  You are unable to feel pressure or pain in your feet.  You develop problems with digestion, such as: ? Nausea. ? Vomiting. ? Bloating. ? Constipation. ? Diarrhea. ? Abdominal pain.  You have difficulty with urination, such as: ? Inability to control when you urinate (incontinence). ? Inability to completely empty the bladder (retention).  You feel as if your heart is racing (palpitations).  You feel dizzy, weak, or faint when you stand up. Get help right away if:  You cannot urinate.  You have sudden weakness or loss of coordination.  You have trouble speaking.  You have pain or pressure in your chest.  You have an irregular heartbeat.  You have sudden inability to move a part of your body. These symptoms may represent a serious problem that is an emergency. Do not wait to see if the symptoms will go away. Get medical help right away. Call your local emergency services (911 in the [place] Do not drive yourself to the hospital. Summary  Diabetic neuropathy is nerve damage that is caused by diabetes. It can cause numbness and pain in the arms, legs, digestive tract, heart, and other body systems.  This condition is treated by keeping your blood glucose level and your A1C level within your target range. This can help prevent neuropathy from getting worse.  Check your skin and feet every day for cuts, bruises, redness, blisters, or sores.  Do not use any products that contain nicotine or tobacco, such as cigarettes, e-cigarettes, and chewing tobacco. This information is not intended to replace advice given to you by your health care provider. Make sure you discuss any questions you have with your health care provider. Document Revised: 12/08/2019 Document Reviewed: 12/08/2019 Elsevier Patient Education  [place].

## 2020-09-10 NOTE — Progress Notes (Signed)
I have read the note, and I agree with the clinical assessment and plan.  Maisie Hauser A. Laurel Smeltz, MD, PhD, FAAN Certified in Neurology, Clinical Neurophysiology, Sleep Medicine, Pain Medicine and Neuroimaging  [facility] [address], [address] [place],  [address] [phone]  

## 2020-09-10 NOTE — Progress Notes (Signed)
PATIENT: Frank [name] DOB: 1956/03/01  REASON FOR VISIT: follow up HISTORY FROM: patient  Chief Complaint  Patient presents with  . Follow-up    RM 1 alone  Pt states things have been ok      HISTORY OF PRESENT ILLNESS: 09/10/20 ALL:  He reports symptoms are stable. He continues duloxetine, Nucynta TID and Lyrica BID. Pain is about the same. He feels he did better with Lyrica 200mg  TID but is aware of concerns of side effects with pain regimen and benzodiazepine use. He has severe OSA but can not tolerate PAP therapy. He continues to follow up closely with PCP and endo. Last A1C was 7.7.   04/09/2020 RS:  Frank [name] is a 65 year old man with a chronic peripheral neuropathy who had superimposed Guillain Barr syndrome in November 2015 and has had severe neuropathic pain  Update 04/09/2020:  He is reporting more intense pain in his limbs,, mostly legs and hands.Pain is burning in quality.   He also feels his muscles are stiff, .   He is on Nucynta 75 mg tid, Lyrica 200 mg po bid and Cymbalta 60 mg po bid.    His PCP prescribes lorazepam 0.5 mg and methylphenidate ER 54 qAM.   He has a lot of numbness in his feet.   No sores.     He has seen podiatry     He is sleeping well.   He has OSA (severe 69.3) but was unable to tolerate CPAP. He has no major weight change since last visit.   He wakes up a couple times a night due to nocturia but quickly falls back asleep..  We discussed he has severe OSA but did not tolerate CPAP and is not interested in surgery or oral appliance.  We discussed trying to get weight under 200 pounds which might help.  He denies much daytime sleepiness now.      11/23/2019 ALL:  Frank [name] is a 65 y.o. male here today for follow up of polyneuropathy, chronic pain and OSA. He did not return to have titration study performed. He does not feel that he will comply with therapy. He understands the risks associated with untreated sleep apnea but does  not feel he will be able to use CPAP therapy. He continues duloxetine 60mg  twice daily, Lyrica 200mg  (last refilled on 10/16/2019) twice daily and Nucynta 75mg  TID (11/03/2019). Also taking lorazepam 0.5mg  BID (10/25/2019) and methylphenidate ER 54mg  daily (01/2019) prescribed by PCP. He has "slacked off" of taking methylphenidate. He is working closely with PCP. Dr Loanne Drilling ([facility]) manages DMT2. Diabetes is not well controlled. Last A1C was 9.2.    HISTORY: (copied from Dr Garth Bigness note on 06/06/2019)  After the last visit we tested a Home sleep study and he ws found to have severe OSA (AHI = 69.3).   He was placed on Auto-PAP but only tried off/on for a few weeks.   He felt suffocated and stopped using it.  The longest he used it any night was 8 hours and he felt he could not sleep with it.      He has a full facemask.    I discussed option:   CPAP titration in-lab to try to optimize PAP treatment, referral to surgery or weight loss with oral appliance.    He continues to report difficulties with his polyneuropathy.  He is noting more pain since the Lyrica was reduced.    He is still taking Nucynta 75 mg tid,  Lyrica 200 mg po bid and Cymbalta 60 mg po bid.    His PCP prescribes lorazepam 0.5 mg and methylphenidate ER 54 qAM.    EPWORTH SLEEPINESS SCALE  On a scale of 0 - 3 what is the chance of dozing:  Sitting and Reading:                           2 Watching TV:                                      2 Sitting inactive in a public place:        1 Passenger in car for one hour:           3 Lying down to rest in the afternoon:   3 Sitting and talking to someone:          0 Sitting quietly after lunch:                   3 In a car, stopped in traffic:                  0  Does not drive  Total (out of 24):   14/24 moderate ESS     REVIEW OF SYSTEMS: Out of a complete 14 system review of symptoms, the patient complains only of the following symptoms, neuropathy, chronic pain and all other  reviewed systems are negative.  ALLERGIES: Allergies  Allergen Reactions  . Codeine Itching    REACTION: Itching  . Influenza Vaccines     Due to hx of GBS  . Metformin And Related Other (See Comments)    GI upset, diarrhea  . Other Other (See Comments)    Due to hx of GBS- vaccines   . Shellfish Allergy     HOME MEDICATIONS: Outpatient Medications Prior to Visit  Medication Sig Dispense Refill  . ARIPiprazole (ABILIFY) 10 MG tablet TAKE 1 TABLET(10 MG) BY MOUTH DAILY 90 tablet 3  . aspirin EC 81 MG tablet Take 1 tablet (81 mg total) by mouth daily.    . cyclobenzaprine (FLEXERIL) 5 MG tablet TAKE 1 TABLET(5 MG) BY MOUTH AT BEDTIME 30 tablet 11  . dapagliflozin propanediol (FARXIGA) 5 MG TABS tablet Take 1 tablet (5 mg total) by mouth daily before breakfast. 90 tablet 3  . docusate calcium (SURFAK) 240 MG capsule Take 240 mg by mouth daily.    . DULoxetine (CYMBALTA) 60 MG capsule Take 1 capsule (60 mg total) by mouth 2 (two) times daily. 180 capsule 4  . famotidine (PEPCID) 40 MG tablet TAKE 1 TABLET(40 MG) BY MOUTH TWICE DAILY 180 tablet 3  . glucose blood (ACCU-CHEK AVIVA PLUS) test strip Use as instructed once daily E11.9 100 each 12  . hydrochlorothiazide (MICROZIDE) 12.5 MG capsule TAKE 1 CAPSULE(12.5 MG) BY MOUTH DAILY 90 capsule 3  . Insulin Pen Needle (B-D ULTRAFINE III SHORT PEN) 31G X 8 MM MISC USE AS DIRECTED WITH TRESIBA PENS DAILY 100 each 2  . Lancets MISC Apply 1 Device topically daily. Once daily E11.9 100 each 11  . LORazepam (ATIVAN) 0.5 MG tablet 1 tab by mouth twice per day as needed 60 tablet 5  . methylphenidate (CONCERTA) 54 MG PO CR tablet Take 1 tablet (54 mg total) by mouth every morning. 30 tablet 0  . omeprazole (PRILOSEC OTC) 20  MG tablet Take 20 mg by mouth daily.    . pregabalin (LYRICA) 200 MG capsule TAKE (1) CAPSULE TWO TIMES DAILY. 180 capsule 1  . rosuvastatin (CRESTOR) 20 MG tablet TAKE 1 TABLET(20 MG) BY MOUTH DAILY 90 tablet 3  . Semaglutide,  1 MG/DOSE, (OZEMPIC, 1 MG/DOSE,) 2 MG/1.5ML SOPN Inject 1 mg into the skin once a week. 9 mL 3  . tapentadol HCl (NUCYNTA) 75 MG tablet Take 1 tablet (75 mg total) by mouth 3 (three) times daily as needed. 90 tablet 0  . TRESIBA FLEXTOUCH 200 UNIT/ML FlexTouch Pen ADMINISTER 90 UNITS UNDER THE SKIN DAILY 18 mL 0  . vitamin B-12 (CYANOCOBALAMIN) 1000 MCG tablet Take 1 tablet (1,000 mcg total) by mouth daily. 90 tablet 1  . Vitamin D, Ergocalciferol, (DRISDOL) 1.25 MG (50000 UT) CAPS capsule Take 1 capsule (50,000 Units total) by mouth every 7 (seven) days. 12 capsule 0   No facility-administered medications prior to visit.    PAST MEDICAL HISTORY: Past Medical History:  Diagnosis Date  . Abdominal pain, left lower quadrant 06/06/2010  . Abdominal pain, unspecified site 01/19/2009  . ADD 10/09/2008  . ALLERGIC RHINITIS 10/09/2008  . Allergy   . ANXIETY DEPRESSION 02/01/2008  . Arthritis    hands ?  . Blood transfusion without reported diagnosis   . Cataract    removed both eyes   . COLONIC POLYPS 02/01/2008  . DIABETES MELLITUS, TYPE II 05/20/2010  . DYSPNEA 03/12/2010  . ERECTILE DYSFUNCTION 10/09/2008  . ERECTILE DYSFUNCTION, ORGANIC 05/20/2010  . FOOT PAIN, LEFT 05/20/2010  . GERD 02/01/2008  . Guillain Barr syndrome ([place])   . HEMORRHOIDS 02/01/2008  . HIATAL HERNIA 02/01/2008  . HYPERLIPIDEMIA 10/09/2008  . HYPERTENSION 10/09/2008  . MORTON'S NEUROMA 05/20/2010  . Other specified forms of hearing loss 06/27/2009  . PERIPHERAL EDEMA 05/20/2010  . PERIPHERAL NEUROPATHY 05/20/2010  . Sleep apnea    stopped cpap   . SLEEP APNEA, OBSTRUCTIVE 02/01/2008  . Stricture and stenosis of esophagus 02/02/2008  . Type II or unspecified type diabetes mellitus without mention of complication, uncontrolled 11/14/2010  . WRIST PAIN, LEFT 12/05/2009    PAST SURGICAL HISTORY: Past Surgical History:  Procedure Laterality Date  . CARPAL TUNNEL RELEASE Bilateral   . COLONOSCOPY    . DENTAL SURGERY    .  ESOPHAGEAL DILATION  july 2009  . ESOPHAGOGASTRODUODENOSCOPY N/A 06/27/2014   Procedure: ESOPHAGOGASTRODUODENOSCOPY (EGD);  Surgeon: Lafayette Dragon, MD;  Location: [facility];  Service: Endoscopy;  Laterality: N/A;  . EYE SURGERY     catract surgery on both eyes  . POLYPECTOMY    . ROTATOR CUFF REPAIR    . UPPER GASTROINTESTINAL ENDOSCOPY      FAMILY HISTORY: Family History  Problem Relation Age of Onset  . Diabetes Mother   . Heart disease Mother   . Hyperlipidemia Mother   . Depression Mother   . Diabetes Brother   . Colon cancer Neg Hx   . Colon polyps Neg Hx   . Esophageal cancer Neg Hx   . Rectal cancer Neg Hx   . Stomach cancer Neg Hx     SOCIAL HISTORY: Social History   Socioeconomic History  . Marital status: Single    Spouse name: Not on file  . Number of children: Not on file  . Years of education: Not on file  . Highest education level: Not on file  Occupational History  . Occupation: Musician: [employer]  Tobacco Use  . Smoking status: Never Smoker  . Smokeless tobacco: Never Used  Vaping Use  . Vaping Use: Never used  Substance and Sexual Activity  . Alcohol use: Yes    Comment: occasional  . Drug use: Yes    Types: Methaqualone  . Sexual activity: Yes    Comment: before I got sick  Other Topics Concern  . Not on file  Social History Narrative  . Not on file   Social Determinants of Health   Financial Resource Strain: Not on file  Food Insecurity: Not on file  Transportation Needs: Not on file  Physical Activity: Not on file  Stress: Not on file  Social Connections: Not on file  Intimate Partner Violence: Not on file      PHYSICAL EXAM  Vitals:   09/10/20 1246  BP: 128/84  Pulse: (!) 109  Weight: 222 lb (100.7 kg)  Height: 5\' 9"  (1.753 m)   Body mass index is 32.78 kg/m.  Generalized: Well developed, in no acute distress  Cardiology: normal rate and rhythm, no murmur noted Neurological examination   Mentation: Alert oriented to time, place, history taking. Follows all commands speech and language fluent Cranial nerve II-XII: Pupils were equal round reactive to light. Extraocular movements were full, visual field were full on confrontational test. Facial sensation and strength were normal. Uvula tongue midline. Head turning and shoulder shrug  were normal and symmetric. Motor: The motor testing reveals 5 over 5 strength of all 4 extremities. Good symmetric motor tone is noted throughout.  Sensory: Sensory testing is reduced in bilateral lower extremities from foot to knee bilaterally, normal in upper extremities, decreased vibratory sensation at left knee and right ankle. He reports no sensation from 1 inch below patella down to pin prick testing. No skin lesions noted.  Coordination: Cerebellar testing reveals good finger-nose-finger and heel-to-shin bilaterally.  Gait and station: Gait is short. Decreased arm swing. Mildly stooped posture.Tandem not attempted. Romberg is positive.   Reflexes: Deep tendon reflexes are symmetric and normal bilaterally.   DIAGNOSTIC DATA (LABS, IMAGING, TESTING) - I reviewed patient records, labs, notes, testing and imaging myself where available.  No flowsheet data found.   Lab Results  Component Value Date   WBC 8.2 07/02/2020   HGB 15.2 07/02/2020   HCT 43.8 07/02/2020   MCV 86.3 07/02/2020   PLT 226.0 07/02/2020      Component Value Date/Time   NA 139 07/02/2020 1458   K 3.9 07/02/2020 1458   CL 100 07/02/2020 1458   CO2 30 07/02/2020 1458   GLUCOSE 155 (H) 07/02/2020 1458   BUN 17 07/02/2020 1458   CREATININE 1.06 07/02/2020 1458   CALCIUM 9.7 07/02/2020 1458   PROT 7.4 07/02/2020 1458   PROT 7.0 08/30/2015 1157   ALBUMIN 4.3 07/02/2020 1458   AST 17 07/02/2020 1458   ALT 17 07/02/2020 1458   ALKPHOS 76 07/02/2020 1458   BILITOT 0.7 07/02/2020 1458   GFRNONAA >90 08/06/2014 0240   GFRAA >90 08/06/2014 0240   Lab Results  Component  Value Date   CHOL 180 07/02/2020   HDL 52.50 07/02/2020   LDLCALC 23 11/16/2017   LDLDIRECT 94.0 07/02/2020   TRIG 317.0 (H) 07/02/2020   CHOLHDL 3 07/02/2020   Lab Results  Component Value Date   HGBA1C 8.4 (A) 09/03/2020   Lab Results  Component Value Date   VITAMINB12 177 (L) 07/02/2020   Lab Results  Component Value Date   TSH 1.80 07/02/2020  ASSESSMENT AND PLAN 65 y.o. year old male  has a past medical history of Abdominal pain, left lower quadrant (06/06/2010), Abdominal pain, unspecified site (01/19/2009), ADD (10/09/2008), ALLERGIC RHINITIS (10/09/2008), Allergy, ANXIETY DEPRESSION (02/01/2008), Arthritis, Blood transfusion without reported diagnosis, Cataract, COLONIC POLYPS (02/01/2008), DIABETES MELLITUS, TYPE II (05/20/2010), DYSPNEA (03/12/2010), ERECTILE DYSFUNCTION (10/09/2008), ERECTILE DYSFUNCTION, ORGANIC (05/20/2010), FOOT PAIN, LEFT (05/20/2010), GERD (02/01/2008), Guillain Barr syndrome ([place]), HEMORRHOIDS (02/01/2008), HIATAL HERNIA (02/01/2008), HYPERLIPIDEMIA (10/09/2008), HYPERTENSION (10/09/2008), MORTON'S NEUROMA (05/20/2010), Other specified forms of hearing loss (06/27/2009), PERIPHERAL EDEMA (05/20/2010), PERIPHERAL NEUROPATHY (05/20/2010), Sleep apnea, SLEEP APNEA, OBSTRUCTIVE (02/01/2008), Stricture and stenosis of esophagus (02/02/2008), Type II or unspecified type diabetes mellitus without mention of complication, uncontrolled (11/14/2010), and WRIST PAIN, LEFT (12/05/2009). here with     ICD-10-CM   1. Diabetic polyneuropathy associated with type 2 diabetes mellitus (HCC)  E11.42   2. Chronic pain syndrome  G89.4   3. Obstructive sleep apnea  G47.33       Frank [name] feels that symptoms are stable today.  We will continue duloxetine 60 mg twice daily, Lyrica 200 mg twice daily and Nucynta 75 mg 3 times daily. PDMP shows appropriate refills.  I will call in compounded neuropathy cream to specialty pharmacy. We will also consider capsaicin patch treatment once available.  We have reviewed common side effects of these medications, specifically, respiratory depression. We have reviewed OSA diagnosis but he is adamant he can not tolerate PAP therapy. We have discussed the importance of weight management and diabetic control. He will continue close follow-up with his primary care provider and endocrinology for management of diabetes and other comorbidities.  Adequate hydration, well-balanced diet and regular exercise advised.  He will follow up with Dr. [name] in 4 months, sooner if needed.  He verbalizes understanding and agreement with this plan.   No orders of the defined types were placed in this encounter.    No orders of the defined types were placed in this encounter.     I spent 30 minutes with the patient. 50% of this time was spent counseling and educating patient on plan of care and medications.     Debbora Presto, FNP-C 09/10/2020, 2:34 PM [facility] [address] [place], [place] [address] [phone]

## 2020-09-11 ENCOUNTER — Ambulatory Visit: Payer: Medicare PPO | Admitting: Family Medicine

## 2020-09-12 ENCOUNTER — Telehealth: Payer: Self-pay | Admitting: Endocrinology

## 2020-09-12 NOTE — Telephone Encounter (Signed)
REFILL REQUEST:  tresiba - patient said pharmacy says it needs a PA but I don't see anything showing we received that from pharmacy.  PHARMACY:  [id number] [place], [place]  [address] [place], [place] [address]  Phone:  [phone] Fax:  [phone]

## 2020-09-12 NOTE — Telephone Encounter (Signed)
Have not seen any form that medication need PA but we on the look out for the form to come through fax.

## 2020-09-13 ENCOUNTER — Telehealth: Payer: Self-pay | Admitting: Endocrinology

## 2020-09-13 NOTE — Telephone Encounter (Signed)
Pt called again needing PA form for Tresiba sent to:  [facility] [place], [place] [address]  [place], [place] [address]  Phone: [phone] Fax: [phone]    Pt only has 2 more shots left.

## 2020-09-13 NOTE — Telephone Encounter (Signed)
error 

## 2020-09-13 NOTE — Telephone Encounter (Signed)
Pt called again regarding his medication asking if we received the PA form? Pt requests his tresiba be refilled  [id number] [place], [place]  [address] [place], [place] [address]  Phone:  [phone] Fax:  [phone]

## 2020-09-14 ENCOUNTER — Other Ambulatory Visit: Payer: Self-pay | Admitting: *Deleted

## 2020-09-14 DIAGNOSIS — IMO0002 Reserved for concepts with insufficient information to code with codable children: Secondary | ICD-10-CM

## 2020-09-14 DIAGNOSIS — E1165 Type 2 diabetes mellitus with hyperglycemia: Secondary | ICD-10-CM

## 2020-09-14 MED ORDER — TRESIBA FLEXTOUCH 200 UNIT/ML ~~LOC~~ SOPN: PEN_INJECTOR | SUBCUTANEOUS | 0 refills | Status: DC

## 2020-09-14 NOTE — Telephone Encounter (Signed)
Please see below.

## 2020-09-14 NOTE — Telephone Encounter (Signed)
Called and advised pt medication has been sent to correct pharmacy and his co pay is $40.00.

## 2020-09-14 NOTE — Telephone Encounter (Signed)
Have not see the form but have form for refill request only. LVM--pt to call the office back.

## 2020-09-14 NOTE — Telephone Encounter (Signed)
Called again checking status of Frank [name]

## 2020-09-14 NOTE — Telephone Encounter (Signed)
I do not have the form

## 2020-09-14 NOTE — Telephone Encounter (Signed)
Patient called again re previous message  "Pt called again needing PA form for Tresiba sent to:  [facility] [place], [place] [address]  [place], [place] [address]  Phone: [phone] Fax: [phone]   Pt only has 2 more shots left."   Patient states he now has only 1 shot left.  Patient requests to be called at ph# [phone] re: Status of the PA for [place]. Patient states he has been trying to get his medication all week. Patient states [facility] stated to Patient that they have not received anything from our office re: the matter listed above.

## 2020-09-14 NOTE — Telephone Encounter (Signed)
Patient called regarding Tresiba/PA/refilling etc - I called his preferred [facility] at [place] and found out that his Tyler Aas is covered by his insurance and the co-pay is $40.00.  The problem at this time is that the [place] that was sent over today was sent to the wrong pharmacy and they have already filled it and have it ready for pick up.  [facility] can not pull the RX from [facility] because of the RX status in their system.  Pharmacy rep at [facility] stated that if we will cancel the RX at [facility] and resend it to them on [facility] then they can fill it, have it ready for patient pick this afternoon, and again the co-pay is $40.00 (no PA necessary).  Please call patient back at [phone] and let him know that his RX has been sent to the right pharmacy and let him know that [facility] said the copay is $40.00 no PA needed.

## 2020-09-17 ENCOUNTER — Other Ambulatory Visit: Payer: Self-pay | Admitting: *Deleted

## 2020-09-17 DIAGNOSIS — IMO0002 Reserved for concepts with insufficient information to code with codable children: Secondary | ICD-10-CM

## 2020-09-17 DIAGNOSIS — E1165 Type 2 diabetes mellitus with hyperglycemia: Secondary | ICD-10-CM

## 2020-09-17 MED ORDER — TRESIBA FLEXTOUCH 200 UNIT/ML ~~LOC~~ SOPN: PEN_INJECTOR | SUBCUTANEOUS | 0 refills | Status: DC

## 2020-09-24 ENCOUNTER — Other Ambulatory Visit: Payer: Self-pay | Admitting: Family Medicine

## 2020-09-24 MED ORDER — TAPENTADOL HCL 75 MG PO TABS: 75.0000 mg | ORAL_TABLET | Freq: Three times a day (TID) | ORAL | 0 refills | Status: DC | PRN

## 2020-09-24 NOTE — Telephone Encounter (Signed)
Pt has called for a refill on his tapentadol HCl (NUCYNTA) 75 MG tablet to  [facility] [facility] [phone]

## 2020-09-24 NOTE — Addendum Note (Signed)
Addended by: Brandon Melnick on: 09/24/2020 12:07 PM   Modules accepted: Orders

## 2020-10-09 ENCOUNTER — Other Ambulatory Visit: Payer: Self-pay | Admitting: Family Medicine

## 2020-10-09 MED ORDER — PREGABALIN 200 MG PO CAPS: ORAL_CAPSULE | ORAL | 1 refills | Status: DC

## 2020-10-09 NOTE — Addendum Note (Signed)
Addended by: Brandon Melnick on: 10/09/2020 03:42 PM   Modules accepted: Orders

## 2020-10-09 NOTE — Telephone Encounter (Signed)
Pt. is requesting a refill for pregabalin (LYRICA) 200 MG capsule.  Pharmacy: [facility] [phone]

## 2020-10-30 ENCOUNTER — Telehealth: Payer: Self-pay

## 2020-10-30 NOTE — Telephone Encounter (Signed)
PA for Trulicity has been approved until 08/10/2021

## 2020-11-05 ENCOUNTER — Other Ambulatory Visit: Payer: Self-pay | Admitting: Family Medicine

## 2020-11-05 ENCOUNTER — Other Ambulatory Visit: Payer: Self-pay

## 2020-11-05 ENCOUNTER — Ambulatory Visit: Payer: Medicare PPO | Admitting: Endocrinology

## 2020-11-05 VITALS — BP 110/70 | HR 105 | Ht 67.0 in | Wt 221.4 lb

## 2020-11-05 DIAGNOSIS — IMO0002 Reserved for concepts with insufficient information to code with codable children: Secondary | ICD-10-CM

## 2020-11-05 DIAGNOSIS — E1165 Type 2 diabetes mellitus with hyperglycemia: Secondary | ICD-10-CM

## 2020-11-05 DIAGNOSIS — E1142 Type 2 diabetes mellitus with diabetic polyneuropathy: Secondary | ICD-10-CM | POA: Diagnosis not present

## 2020-11-05 LAB — POCT GLYCOSYLATED HEMOGLOBIN (HGB A1C): Hemoglobin A1C: 7 % — AB (ref 4.0–5.6)

## 2020-11-05 MED ORDER — TAPENTADOL HCL 75 MG PO TABS: 75.0000 mg | ORAL_TABLET | Freq: Three times a day (TID) | ORAL | 0 refills | Status: DC | PRN

## 2020-11-05 NOTE — Addendum Note (Signed)
Addended by: Brandon Melnick on: 11/05/2020 03:50 PM   Modules accepted: Orders

## 2020-11-05 NOTE — Progress Notes (Signed)
Subjective:    Patient ID: Frank [name], male    DOB: 10/29/1955, 64 y.o.   MRN: [id number]  HPI Pt returns for f/u of diabetes mellitus: DM type: Insulin-requiring type 2 Dx'ed: 1093 Complications: PN and CRI.  Therapy: insulin since 2019, Ozempic, and Farxiga DKA: never Severe hypoglycemia: never.   Pancreatitis: never Pancreatic imaging: normal on 2006 CT.   SDOH: due to memory loss, he is not a candidate for multiple daily injections.   Other: he did not tolerate metformin-XR (GI sxs); he takes QD insulin, due to noncompliance; pt injects his own insulin.   Interval history: He brings a record of his cbg's which I have reviewed today.  He checks fasting only.  cbg's vary from 83-250.  he says he does not miss meds.  pt states he feels well in general.   Past Medical History:  Diagnosis Date  . Abdominal pain, left lower quadrant 06/06/2010  . Abdominal pain, unspecified site 01/19/2009  . ADD 10/09/2008  . ALLERGIC RHINITIS 10/09/2008  . Allergy   . ANXIETY DEPRESSION 02/01/2008  . Arthritis    hands ?  . Blood transfusion without reported diagnosis   . Cataract    removed both eyes   . COLONIC POLYPS 02/01/2008  . DIABETES MELLITUS, TYPE II 05/20/2010  . DYSPNEA 03/12/2010  . ERECTILE DYSFUNCTION 10/09/2008  . ERECTILE DYSFUNCTION, ORGANIC 05/20/2010  . FOOT PAIN, LEFT 05/20/2010  . GERD 02/01/2008  . Guillain Barr syndrome ([place])   . HEMORRHOIDS 02/01/2008  . HIATAL HERNIA 02/01/2008  . HYPERLIPIDEMIA 10/09/2008  . HYPERTENSION 10/09/2008  . MORTON'S NEUROMA 05/20/2010  . Other specified forms of hearing loss 06/27/2009  . PERIPHERAL EDEMA 05/20/2010  . PERIPHERAL NEUROPATHY 05/20/2010  . Sleep apnea    stopped cpap   . SLEEP APNEA, OBSTRUCTIVE 02/01/2008  . Stricture and stenosis of esophagus 02/02/2008  . Type II or unspecified type diabetes mellitus without mention of complication, uncontrolled 11/14/2010  . WRIST PAIN, LEFT 12/05/2009    Past Surgical History:   Procedure Laterality Date  . CARPAL TUNNEL RELEASE Bilateral   . COLONOSCOPY    . DENTAL SURGERY    . ESOPHAGEAL DILATION  july 2009  . ESOPHAGOGASTRODUODENOSCOPY N/A 06/27/2014   Procedure: ESOPHAGOGASTRODUODENOSCOPY (EGD);  Surgeon: Lafayette Dragon, MD;  Location: [facility];  Service: Endoscopy;  Laterality: N/A;  . EYE SURGERY     catract surgery on both eyes  . POLYPECTOMY    . ROTATOR CUFF REPAIR    . UPPER GASTROINTESTINAL ENDOSCOPY      Social History   Socioeconomic History  . Marital status: Single    Spouse name: Not on file  . Number of children: Not on file  . Years of education: Not on file  . Highest education level: Not on file  Occupational History  . Occupation: Musician: [employer]  Tobacco Use  . Smoking status: Never Smoker  . Smokeless tobacco: Never Used  Vaping Use  . Vaping Use: Never used  Substance and Sexual Activity  . Alcohol use: Yes    Comment: occasional  . Drug use: Yes    Types: Methaqualone  . Sexual activity: Yes    Comment: before I got sick  Other Topics Concern  . Not on file  Social History Narrative  . Not on file   Social Determinants of Health   Financial Resource Strain: Not on file  Food Insecurity: Not on file  Transportation Needs: Not  on file  Physical Activity: Not on file  Stress: Not on file  Social Connections: Not on file  Intimate Partner Violence: Not on file    Current Outpatient Medications on File Prior to Visit  Medication Sig Dispense Refill  . ARIPiprazole (ABILIFY) 10 MG tablet TAKE 1 TABLET(10 MG) BY MOUTH DAILY 90 tablet 3  . aspirin EC 81 MG tablet Take 1 tablet (81 mg total) by mouth daily.    . cyclobenzaprine (FLEXERIL) 5 MG tablet TAKE 1 TABLET(5 MG) BY MOUTH AT BEDTIME 30 tablet 11  . dapagliflozin propanediol (FARXIGA) 5 MG TABS tablet Take 1 tablet (5 mg total) by mouth daily before breakfast. 90 tablet 3  . docusate calcium (SURFAK) 240 MG capsule Take 240 mg  by mouth daily.    . DULoxetine (CYMBALTA) 60 MG capsule Take 1 capsule (60 mg total) by mouth 2 (two) times daily. 180 capsule 4  . famotidine (PEPCID) 40 MG tablet TAKE 1 TABLET(40 MG) BY MOUTH TWICE DAILY 180 tablet 3  . glucose blood (ACCU-CHEK AVIVA PLUS) test strip Use as instructed once daily E11.9 100 each 12  . hydrochlorothiazide (MICROZIDE) 12.5 MG capsule TAKE 1 CAPSULE(12.5 MG) BY MOUTH DAILY 90 capsule 3  . insulin degludec (TRESIBA FLEXTOUCH) 200 UNIT/ML FlexTouch Pen ADMINISTER 90 UNITS UNDER THE SKIN DAILY 18 mL 0  . Insulin Pen Needle (B-D ULTRAFINE III SHORT PEN) 31G X 8 MM MISC USE AS DIRECTED WITH TRESIBA PENS DAILY 100 each 2  . Lancets MISC Apply 1 Device topically daily. Once daily E11.9 100 each 11  . LORazepam (ATIVAN) 0.5 MG tablet 1 tab by mouth twice per day as needed 60 tablet 5  . methylphenidate (CONCERTA) 54 MG PO CR tablet Take 1 tablet (54 mg total) by mouth every morning. 30 tablet 0  . omeprazole (PRILOSEC OTC) 20 MG tablet Take 20 mg by mouth daily.    . pregabalin (LYRICA) 200 MG capsule TAKE (1) CAPSULE TWO TIMES DAILY. 180 capsule 1  . rosuvastatin (CRESTOR) 20 MG tablet TAKE 1 TABLET(20 MG) BY MOUTH DAILY 90 tablet 3  . Semaglutide, 1 MG/DOSE, (OZEMPIC, 1 MG/DOSE,) 2 MG/1.5ML SOPN Inject 1 mg into the skin once a week. 9 mL 3  . vitamin B-12 (CYANOCOBALAMIN) 1000 MCG tablet Take 1 tablet (1,000 mcg total) by mouth daily. 90 tablet 1  . Vitamin D, Ergocalciferol, (DRISDOL) 1.25 MG (50000 UT) CAPS capsule Take 1 capsule (50,000 Units total) by mouth every 7 (seven) days. 12 capsule 0   No current facility-administered medications on file prior to visit.    Allergies  Allergen Reactions  . Codeine Itching    REACTION: Itching  . Influenza Vaccines     Due to hx of GBS  . Metformin And Related Other (See Comments)    GI upset, diarrhea  . Other Other (See Comments)    Due to hx of GBS- vaccines   . Shellfish Allergy     Family History  Problem  Relation Age of Onset  . Diabetes Mother   . Heart disease Mother   . Hyperlipidemia Mother   . Depression Mother   . Diabetes Brother   . Colon cancer Neg Hx   . Colon polyps Neg Hx   . Esophageal cancer Neg Hx   . Rectal cancer Neg Hx   . Stomach cancer Neg Hx     BP 110/70 (BP Location: Right Arm, Patient Position: Sitting, Cuff Size: Large)   Pulse (!) 105  Ht 5\' 7"  (1.702 m)   Wt 221 lb 6.4 oz (100.4 kg)   SpO2 94%   BMI 34.68 kg/m    Review of Systems He denies hypoglycemia    Objective:   Physical Exam VITAL SIGNS:  See vs page GENERAL: no distress Pulses: dorsalis pedis intact bilat.   MSK: no deformity of the feet CV: no leg edema Skin:  no ulcer on the feet.  normal color and temp on the feet. Neuro: sensation is intact to touch on the feet, but decreased from normal Ext: there is bilateral onychomycosis of the toenails.     Lab Results  Component Value Date   HGBA1C 7.0 (A) 11/05/2020   Lab Results  Component Value Date   TSH 1.80 07/02/2020   Lab Results  Component Value Date   CREATININE 1.06 07/02/2020   BUN 17 07/02/2020   NA 139 07/02/2020   K 3.9 07/02/2020   CL 100 07/02/2020   CO2 30 07/02/2020       Assessment & Plan:  Insulin-requiring type 2 DM, with CRI: well-controlled  Patient Instructions  Please continue the same 3 diabetes medications check your blood sugar once a day.  vary the time of day when you check, between before the 3 meals, and at bedtime.  also check if you have symptoms of your blood sugar being too high or too low.  please keep a record of the readings and bring it to your next appointment here (or you can bring the meter itself).  You can write it on any piece of paper.  please call us sooner if your blood sugar goes below 70, or if you have a lot of readings over 200.  Please come back for a follow-up appointment in 3-4 months.

## 2020-11-05 NOTE — Telephone Encounter (Signed)
Pt is requesting a refill for tapentadol HCl (NUCYNTA) 75 MG tablet.  Pharmacy: [id number]   

## 2020-11-05 NOTE — Patient Instructions (Addendum)
Please continue the same 3 diabetes medications check your blood sugar once a day.  vary the time of day when you check, between before the 3 meals, and at bedtime.  also check if you have symptoms of your blood sugar being too high or too low.  please keep a record of the readings and bring it to your next appointment here (or you can bring the meter itself).  You can write it on any piece of paper.  please call us sooner if your blood sugar goes below 70, or if you have a lot of readings over 200.  Please come back for a follow-up appointment in 3-4 months.

## 2020-11-20 ENCOUNTER — Telehealth: Payer: Self-pay | Admitting: Endocrinology

## 2020-11-20 NOTE — Telephone Encounter (Signed)
Spoke with pt to let him know that if his medication is requesting a PA then the pharmacy has to send [place] some information stating that the [place] needs a PA

## 2020-11-20 NOTE — Telephone Encounter (Signed)
Pt called to let us know he needs a PA done for his tresiba  [place] [place] [address] Phone:  [phone]  Fax:  [phone]

## 2020-11-22 NOTE — Telephone Encounter (Signed)
Pt called to request a PA be done on his tresiba. Pt states his pharmacy sent [place] over forms to be completed

## 2020-11-26 ENCOUNTER — Telehealth: Payer: Self-pay | Admitting: Endocrinology

## 2020-11-26 NOTE — Telephone Encounter (Signed)
I have spoken with pt

## 2020-11-26 NOTE — Telephone Encounter (Signed)
Spoke with [name], the pt's spouse and he is requesting a PA for [place]. I know that a PA was done and approved for Trulicity. He said that you took him off of trulicity and placed him on Tresiba which is requiring a PA. I lloked back eine the OV to see if you had switched and not sure if I looked over the change or not. Could you please verify that this pt is on tresiba and not trulicity so that I can do PA.  Please Advise

## 2020-11-26 NOTE — Telephone Encounter (Signed)
Patient's spouse [name] cook called to check on status of PA.  I advised that the PA process started on 11/20/20 but that it could take up to 7-10 business days.  Patient only has 1 dose of medication left before he is completely out.  They want to know what they should do without medication.  Have requested a call back to [facility]

## 2020-11-27 NOTE — Telephone Encounter (Signed)
Pt is on Tresiba, not Trulicity. If PA is needed for [place], we need to know what alternative is.

## 2020-11-28 ENCOUNTER — Other Ambulatory Visit: Payer: Self-pay

## 2020-11-28 DIAGNOSIS — IMO0002 Reserved for concepts with insufficient information to code with codable children: Secondary | ICD-10-CM

## 2020-11-28 DIAGNOSIS — E1142 Type 2 diabetes mellitus with diabetic polyneuropathy: Secondary | ICD-10-CM

## 2020-11-28 MED ORDER — TRESIBA FLEXTOUCH 200 UNIT/ML ~~LOC~~ SOPN: PEN_INJECTOR | SUBCUTANEOUS | 2 refills | Status: DC

## 2020-11-28 NOTE — Telephone Encounter (Signed)
LVM for pt to contact his insurance company to find out what the alternative is for tresiba and to contact the office and let me know so that Loanne Drilling can make any necessary changes.

## 2020-11-28 NOTE — Telephone Encounter (Signed)
Frank [name] from [place] calling on behalf of patient and states that insulin degludec (TRESIBA FLEXTOUCH) 200 UNIT/ML FlexTouch Pen is covered under insurance and pt is  out of his insulin as of today.  Pt would like a call back.

## 2020-11-28 NOTE — Telephone Encounter (Signed)
Rx sent 

## 2020-12-18 ENCOUNTER — Ambulatory Visit: Payer: Medicare PPO | Admitting: Family Medicine

## 2020-12-24 ENCOUNTER — Other Ambulatory Visit: Payer: Self-pay | Admitting: Family Medicine

## 2020-12-24 MED ORDER — TAPENTADOL HCL 75 MG PO TABS: 75.0000 mg | ORAL_TABLET | Freq: Three times a day (TID) | ORAL | 0 refills | Status: DC | PRN

## 2020-12-24 NOTE — Telephone Encounter (Signed)
Pt is requesting a refill for tapentadol HCl (NUCYNTA) 75 MG tablet.  Pharmacy: [id number]   

## 2020-12-24 NOTE — Telephone Encounter (Signed)
[place] controlled drug registry checked, last filled 11/06/20. Pt was last seen with Amy Lomax 09/10/20. Advised to Fu in 4 mo with Dr.Sater. Has upcoming apt with him 01/28/21. Refill is appropriate on my end, Rx is pended for approval. Please advise.

## 2020-12-24 NOTE — Addendum Note (Signed)
Addended by: Gertie Baron D on: 12/24/2020 11:25 AM   Modules accepted: Orders

## 2021-01-06 ENCOUNTER — Other Ambulatory Visit: Payer: Self-pay | Admitting: Internal Medicine

## 2021-01-14 ENCOUNTER — Other Ambulatory Visit: Payer: Self-pay | Admitting: Internal Medicine

## 2021-01-14 MED ORDER — LORAZEPAM 0.5 MG PO TABS: ORAL_TABLET | ORAL | 5 refills | Status: DC

## 2021-01-28 ENCOUNTER — Other Ambulatory Visit: Payer: Self-pay | Admitting: Family Medicine

## 2021-01-28 ENCOUNTER — Ambulatory Visit: Payer: Medicare PPO | Admitting: Neurology

## 2021-01-28 MED ORDER — TAPENTADOL HCL 75 MG PO TABS: 75.0000 mg | ORAL_TABLET | Freq: Three times a day (TID) | ORAL | 0 refills | Status: DC | PRN

## 2021-01-28 NOTE — Telephone Encounter (Signed)
Pt called needing a refill on his tapentadol HCl (NUCYNTA) 75 MG tablet sent in to the [facility] on [address]

## 2021-02-27 ENCOUNTER — Other Ambulatory Visit: Payer: Self-pay | Admitting: Family Medicine

## 2021-02-27 MED ORDER — TAPENTADOL HCL 75 MG PO TABS: 75.0000 mg | ORAL_TABLET | Freq: Three times a day (TID) | ORAL | 0 refills | Status: DC | PRN

## 2021-02-27 NOTE — Telephone Encounter (Signed)
Received refill request for tapentadol. Last OV was on 09/10/20 with Amy, NP.  Next OV is scheduled for 03/11/21 with Dr. [name].  Last RX was written on 02/01/21 for 90 tabs.   With starting day 03/02/21  [place] [facility] Database has been reviewed.

## 2021-02-27 NOTE — Telephone Encounter (Signed)
Pt is requesting a refill for tapentadol HCl (NUCYNTA) 75 MG tablet.  Pharmacy: [id number]   

## 2021-03-04 ENCOUNTER — Other Ambulatory Visit: Payer: Self-pay

## 2021-03-04 ENCOUNTER — Ambulatory Visit: Payer: Medicare PPO | Admitting: Endocrinology

## 2021-03-04 VITALS — BP 120/90 | HR 104 | Ht 67.0 in | Wt 219.0 lb

## 2021-03-04 DIAGNOSIS — E1142 Type 2 diabetes mellitus with diabetic polyneuropathy: Secondary | ICD-10-CM | POA: Diagnosis not present

## 2021-03-04 DIAGNOSIS — E1165 Type 2 diabetes mellitus with hyperglycemia: Secondary | ICD-10-CM | POA: Diagnosis not present

## 2021-03-04 DIAGNOSIS — IMO0002 Reserved for concepts with insufficient information to code with codable children: Secondary | ICD-10-CM

## 2021-03-04 LAB — POCT GLYCOSYLATED HEMOGLOBIN (HGB A1C): Hemoglobin A1C: 7.7 % — AB (ref 4.0–5.6)

## 2021-03-04 MED ORDER — TRESIBA FLEXTOUCH 200 UNIT/ML ~~LOC~~ SOPN
80.0000 [IU] | PEN_INJECTOR | Freq: Every day | SUBCUTANEOUS | 3 refills | Status: DC
Start: 2021-03-04 — End: 2021-06-10

## 2021-03-04 MED ORDER — OZEMPIC (2 MG/DOSE) 8 MG/3ML ~~LOC~~ SOPN
2.0000 mg | PEN_INJECTOR | SUBCUTANEOUS | 3 refills | Status: DC
Start: 2021-03-04 — End: 2021-06-28

## 2021-03-04 NOTE — Progress Notes (Signed)
Subjective:    Patient ID: Frank [name], male    DOB: 1955/10/10, 65 y.o.   MRN: [id number]  HPI Pt returns for f/u of diabetes mellitus: DM type: Insulin-requiring type 2 Dx'ed: 0000000 Complications: PN and CRI.  Therapy: insulin since 2019, Ozempic, and Farxiga DKA: never Severe hypoglycemia: never.   Pancreatitis: never Pancreatic imaging: normal on 2006 CT.   SDOH: due to memory loss, he is not a candidate for multiple daily injections.   Other: he did not tolerate metformin-XR (GI sxs); he takes QD insulin, due to noncompliance; pt injects his own insulin.   Interval history: He brings a record of his cbg's which I have reviewed today.  He checks fasting only.  cbg's vary from 94-285.  he says he does not miss meds.  pt states he feels well in general.   Past Medical History:  Diagnosis Date   Abdominal pain, left lower quadrant 06/06/2010   Abdominal pain, unspecified site 01/19/2009   ADD 10/09/2008   ALLERGIC RHINITIS 10/09/2008   Allergy    ANXIETY DEPRESSION 02/01/2008   Arthritis    hands ?   Blood transfusion without reported diagnosis    Cataract    removed both eyes    COLONIC POLYPS 02/01/2008   DIABETES MELLITUS, TYPE II 05/20/2010   DYSPNEA 03/12/2010   ERECTILE DYSFUNCTION 10/09/2008   ERECTILE DYSFUNCTION, ORGANIC 05/20/2010   FOOT PAIN, LEFT 05/20/2010   GERD 02/01/2008   Guillain Barr syndrome ([place])    HEMORRHOIDS 02/01/2008   HIATAL HERNIA 02/01/2008   HYPERLIPIDEMIA 10/09/2008   HYPERTENSION 10/09/2008   MORTON'S NEUROMA 05/20/2010   Other specified forms of hearing loss 06/27/2009   PERIPHERAL EDEMA 05/20/2010   PERIPHERAL NEUROPATHY 05/20/2010   Sleep apnea    stopped cpap    SLEEP APNEA, OBSTRUCTIVE 02/01/2008   Stricture and stenosis of esophagus 02/02/2008   Type II or unspecified type diabetes mellitus without mention of complication, uncontrolled 11/14/2010   WRIST PAIN, LEFT 12/05/2009    Past Surgical History:  Procedure Laterality Date    CARPAL TUNNEL RELEASE Bilateral    COLONOSCOPY     DENTAL SURGERY     ESOPHAGEAL DILATION  july 2009   ESOPHAGOGASTRODUODENOSCOPY N/A 06/27/2014   Procedure: ESOPHAGOGASTRODUODENOSCOPY (EGD);  Surgeon: Lafayette Dragon, MD;  Location: [facility];  Service: Endoscopy;  Laterality: N/A;   EYE SURGERY     catract surgery on both eyes   POLYPECTOMY     ROTATOR CUFF REPAIR     UPPER GASTROINTESTINAL ENDOSCOPY      Social History   Socioeconomic History   Marital status: Single    Spouse name: Not on file   Number of children: Not on file   Years of education: Not on file   Highest education level: Not on file  Occupational History   Occupation: Housekeeper [employer]    Employer: [employer]  Tobacco Use   Smoking status: Never   Smokeless tobacco: Never  Vaping Use   Vaping Use: Never used  Substance and Sexual Activity   Alcohol use: Yes    Comment: occasional   Drug use: Yes    Types: Methaqualone   Sexual activity: Yes    Comment: before I got sick  Other Topics Concern   Not on file  Social History Narrative   Not on file   Social Determinants of Health   Financial Resource Strain: Not on file  Food Insecurity: Not on file  Transportation Needs: Not on file  Physical Activity: Not on file  Stress: Not on file  Social Connections: Not on file  Intimate Partner Violence: Not on file    Current Outpatient Medications on File Prior to Visit  Medication Sig Dispense Refill   ACCU-CHEK GUIDE test strip USE AS DIRECTED ONCE DAILY 100 strip 11   Accu-Chek Softclix Lancets lancets USE AS DIRECTED ONCE DAILY 100 each 11   ARIPiprazole (ABILIFY) 10 MG tablet TAKE 1 TABLET(10 MG) BY MOUTH DAILY 90 tablet 3   aspirin EC 81 MG tablet Take 1 tablet (81 mg total) by mouth daily.     cyclobenzaprine (FLEXERIL) 5 MG tablet TAKE 1 TABLET(5 MG) BY MOUTH AT BEDTIME 30 tablet 11   dapagliflozin propanediol (FARXIGA) 5 MG TABS tablet Take 1 tablet (5 mg total) by mouth daily before  breakfast. 90 tablet 3   docusate calcium (SURFAK) 240 MG capsule Take 240 mg by mouth daily.     DULoxetine (CYMBALTA) 60 MG capsule Take 1 capsule (60 mg total) by mouth 2 (two) times daily. 180 capsule 4   famotidine (PEPCID) 40 MG tablet TAKE 1 TABLET(40 MG) BY MOUTH TWICE DAILY 180 tablet 3   hydrochlorothiazide (MICROZIDE) 12.5 MG capsule TAKE 1 CAPSULE(12.5 MG) BY MOUTH DAILY 90 capsule 3   Insulin Pen Needle (B-D ULTRAFINE III SHORT PEN) 31G X 8 MM MISC USE AS DIRECTED WITH TRESIBA PENS DAILY 100 each 2   LORazepam (ATIVAN) 0.5 MG tablet 1 tab by mouth twice per day as needed 60 tablet 5   methylphenidate (CONCERTA) 54 MG PO CR tablet Take 1 tablet (54 mg total) by mouth every morning. 30 tablet 0   omeprazole (PRILOSEC OTC) 20 MG tablet Take 20 mg by mouth daily.     pregabalin (LYRICA) 200 MG capsule TAKE (1) CAPSULE TWO TIMES DAILY. 180 capsule 1   rosuvastatin (CRESTOR) 20 MG tablet TAKE 1 TABLET(20 MG) BY MOUTH DAILY 90 tablet 3   tapentadol HCl (NUCYNTA) 75 MG tablet Take 1 tablet (75 mg total) by mouth 3 (three) times daily as needed. 90 tablet 0   vitamin B-12 (CYANOCOBALAMIN) 1000 MCG tablet Take 1 tablet (1,000 mcg total) by mouth daily. 90 tablet 1   Vitamin D, Ergocalciferol, (DRISDOL) 1.25 MG (50000 UT) CAPS capsule Take 1 capsule (50,000 Units total) by mouth every 7 (seven) days. 12 capsule 0   No current facility-administered medications on file prior to visit.    Allergies  Allergen Reactions   Codeine Itching    REACTION: Itching   Influenza Vaccines     Due to hx of GBS   Metformin And Related Other (See Comments)    GI upset, diarrhea   Other Other (See Comments)    Due to hx of GBS- vaccines    Shellfish Allergy     Family History  Problem Relation Age of Onset   Diabetes Mother    Heart disease Mother    Hyperlipidemia Mother    Depression Mother    Diabetes Brother    Colon cancer Neg Hx    Colon polyps Neg Hx    Esophageal cancer Neg Hx     Rectal cancer Neg Hx    Stomach cancer Neg Hx     BP 120/90 (BP Location: Right Arm, Patient Position: Sitting, Cuff Size: Normal)   Pulse (!) 104   Ht '5\' 7"'$  (1.702 m)   Wt 219 lb (99.3 kg)   SpO2 97%   BMI 34.30 kg/m    Review of  Systems Denies n/v.  He denies hypoglycemia     Objective:   Physical Exam Pulses: dorsalis pedis intact bilat.   MSK: no deformity of the feet CV: no leg edema.   Skin:  no ulcer on the feet.  normal color and temp on the feet.   Neuro: sensation is intact to touch on the feet, but decreased from normal.   Ext: there is bilateral onychomycosis of the toenails.   A1c=7.7%     Assessment & Plan:  Insulin-requiring type 2 DM: uncontrolled.  We'll favor GLP rx.     Patient Instructions  I have sent 2 prescriptions to your pharmacy: to double the Ozempic, and to decrease the Tresiba to 80 units per day.  Please continue the same [place]. check your blood sugar once a day.  vary the time of day when you check, between before the 3 meals, and at bedtime.  also check if you have symptoms of your blood sugar being too high or too low.  please keep a record of the readings and bring it to your next appointment here (or you can bring the meter itself).  You can write it on any piece of paper.  please call us sooner if your blood sugar goes below 70, or if you have a lot of readings over 200.   Please come back for a follow-up appointment in 3 months.

## 2021-03-04 NOTE — Patient Instructions (Addendum)
I have sent 2 prescriptions to your pharmacy: to double the Ozempic, and to decrease the Tresiba to 80 units per day.  Please continue the same [place]. check your blood sugar once a day.  vary the time of day when you check, between before the 3 meals, and at bedtime.  also check if you have symptoms of your blood sugar being too high or too low.  please keep a record of the readings and bring it to your next appointment here (or you can bring the meter itself).  You can write it on any piece of paper.  please call us sooner if your blood sugar goes below 70, or if you have a lot of readings over 200.   Please come back for a follow-up appointment in 3 months.

## 2021-03-11 ENCOUNTER — Other Ambulatory Visit: Payer: Self-pay

## 2021-03-11 ENCOUNTER — Ambulatory Visit: Payer: Medicare PPO | Admitting: Neurology

## 2021-03-11 ENCOUNTER — Encounter: Payer: Self-pay | Admitting: Neurology

## 2021-03-11 VITALS — BP 131/82 | HR 110 | Ht 69.0 in | Wt 220.0 lb

## 2021-03-11 DIAGNOSIS — R208 Other disturbances of skin sensation: Secondary | ICD-10-CM

## 2021-03-11 DIAGNOSIS — G4733 Obstructive sleep apnea (adult) (pediatric): Secondary | ICD-10-CM

## 2021-03-11 DIAGNOSIS — G894 Chronic pain syndrome: Secondary | ICD-10-CM

## 2021-03-11 DIAGNOSIS — G4719 Other hypersomnia: Secondary | ICD-10-CM | POA: Diagnosis not present

## 2021-03-11 DIAGNOSIS — E1142 Type 2 diabetes mellitus with diabetic polyneuropathy: Secondary | ICD-10-CM

## 2021-03-11 MED ORDER — PREGABALIN 200 MG PO CAPS: ORAL_CAPSULE | ORAL | 1 refills | Status: DC

## 2021-03-11 MED ORDER — DULOXETINE HCL 60 MG PO CPEP: 60.0000 mg | ORAL_CAPSULE | Freq: Two times a day (BID) | ORAL | 4 refills | Status: DC

## 2021-03-11 NOTE — Progress Notes (Signed)
[facility]  PATIENT: Frank [name] DOB: 04-Apr-1956  _________________________________   HISTORICAL  CHIEF COMPLAINT:  Chief Complaint  Patient presents with   Follow-up    Rm 2, alone. Pt reports being stable. No new or worsening of sx.     HISTORY OF PRESENT ILLNESS:  Frank [name] is a 65 year old man with a chronic peripheral neuropathy who had superimposed Guillain Barr syndrome in November 2015 and has had severe neuropathic pain  Update 03/11/2021: He feels he pain is doing about the same.    It is mostly legs, feet and hands.  Pain is mostly from diabetic polyneuropathy.  He also has carpal tunnel syndrome.  He also feels his muscles are stiff, .   He is on Nucynta 75 mg tid, Lyrica 200 mg po bid and Cymbalta 60 mg po bid.    His PCP prescribes lorazepam 0.5 mg bid.   He no longer takes methylphenidate ER 54 qAM   The NCCS database shows good compliance.       He has a lot of numbness in his feet.   No sores.    No falls.   He needs to hold something if eyes are closed  He is sleeping well.   He has OSA (severe 69.3) but was unable to tolerate CPAP. He has no major weight change since last visit.   He wakes up a couple times a night due to nocturia but quickly falls back asleep..  We discussed he has severe OSA but did not tolerate CPAP and is not interested in surgery or oral appliance.     EPWORTH SLEEPINESS SCALE 03/11/2021  On a scale of 0 - 3 what is the chance of dozing:  Sitting and Reading:   1 Watching TV:    3 Sitting inactive in a public place: 2 Passenger in car for one hour: 3 Lying down to rest in the afternoon: 3 Sitting and talking to someone: 0 Sitting quietly after lunch:  3 In a car, stopped in traffic:  0  Does not drive  Total (out of 24):   15  /24 moderate ESS   B12 was low 2021 but he never supplemented.     GBS History:   The initial episode of GBS was treated with plasmapheresis. He reports that the diagnosis took  a couple weeks and he had several emergency room visits. Initially he was sent to a skilled nursing facility. He was reporting a lot of back pain alongside the weakness. A lumbar puncture was eventually done showing high protein which led to the dye diagnosis of GBS.  He had severe weakness at the peak and required intubation for respiratory support. At that time, he was unable to move his legs and could barely move his arms. He improved quite a bit after plasmapheresis and was discharged. However, a couple days after discharge he began to feel weak again and return to the emergency room. He was recently admitted for recurrent GBS. For that hospital stay he did not require intubation but did have a facemask oxygen. He also was treated with IVIG the second time around. He improved and was discharged home. He did outpatient physical therapy. He has had a fairly good in movement of his strength and is able to walk independently. However, he has had more dysesthetic pain.   With the exception of more pain, he otherwise feels very close to his pre-GBS baseline.   Of note, he had a flu shot a  few weeks before the first episode of GBS.  MRI's from 06/2014 and 07/2014:    Cervical MRI shows spinal stenosis at C4-C5, C5-C6 and C6-C7. There is no spinal cord compression. Thoracic spine shows T8-T9 disc extrusion to the left that does not cause spinal cord compression. There are mild degenerative changes in the lumbar spine. Contrast was not used.   I also reviewed many of the notes and labs from his hospital stay. CSF Protein was greatly elevated at 118 and the CSF white blood cell count was 1.    REVIEW OF SYSTEMS: Constitutional: No fevers, chills, sweats, or change in appetite.   Has sleepiness Eyes: No visual changes, double vision, eye pain Ear, nose and throat: No hearing loss, ear pain, nasal congestion, sore throat Cardiovascular: No chest pain, palpitations Respiratory:  No shortness of breath at rest or  with exertion.   No wheezes.  He has OSA GastrointestinaI: No nausea, vomiting, diarrhea, abdominal pain, fecal incontinence Genitourinary:  No dysuria, urinary retention or frequency.  No nocturia. Musculoskeletal:  No neck pain, back pain Integumentary: No rash, pruritus, skin lesions Neurological: as above Psychiatric: Anxiety and depression at this time.   ADD is stable. Endocrine: No palpitations, diaphoresis, change in appetite, change in weigh or increased thirst Hematologic/Lymphatic:  No anemia, purpura, petechiae. Allergic/Immunologic: No itchy/runny eyes, nasal congestion, recent allergic reactions, rashes  ALLERGIES: Allergies  Allergen Reactions   Codeine Itching    REACTION: Itching   Influenza Vaccines     Due to hx of GBS   Metformin And Related Other (See Comments)    GI upset, diarrhea   Other Other (See Comments)    Due to hx of GBS- vaccines    Shellfish Allergy     HOME MEDICATIONS:  Current Outpatient Medications:    ACCU-CHEK GUIDE test strip, USE AS DIRECTED ONCE DAILY, Disp: 100 strip, Rfl: 11   Accu-Chek Softclix Lancets lancets, USE AS DIRECTED ONCE DAILY, Disp: 100 each, Rfl: 11   ARIPiprazole (ABILIFY) 10 MG tablet, TAKE 1 TABLET(10 MG) BY MOUTH DAILY, Disp: 90 tablet, Rfl: 3   aspirin EC 81 MG tablet, Take 1 tablet (81 mg total) by mouth daily., Disp: , Rfl:    cyclobenzaprine (FLEXERIL) 5 MG tablet, TAKE 1 TABLET(5 MG) BY MOUTH AT BEDTIME, Disp: 30 tablet, Rfl: 11   dapagliflozin propanediol (FARXIGA) 5 MG TABS tablet, Take 1 tablet (5 mg total) by mouth daily before breakfast., Disp: 90 tablet, Rfl: 3   docusate calcium (SURFAK) 240 MG capsule, Take 240 mg by mouth daily., Disp: , Rfl:    famotidine (PEPCID) 40 MG tablet, TAKE 1 TABLET(40 MG) BY MOUTH TWICE DAILY, Disp: 180 tablet, Rfl: 3   hydrochlorothiazide (MICROZIDE) 12.5 MG capsule, TAKE 1 CAPSULE(12.5 MG) BY MOUTH DAILY, Disp: 90 capsule, Rfl: 3   insulin degludec (TRESIBA FLEXTOUCH) 200  UNIT/ML FlexTouch Pen, Inject 80 Units into the skin daily., Disp: 42 mL, Rfl: 3   Insulin Pen Needle (B-D ULTRAFINE III SHORT PEN) 31G X 8 MM MISC, USE AS DIRECTED WITH TRESIBA PENS DAILY, Disp: 100 each, Rfl: 2   LORazepam (ATIVAN) 0.5 MG tablet, 1 tab by mouth twice per day as needed, Disp: 60 tablet, Rfl: 5   methylphenidate (CONCERTA) 54 MG PO CR tablet, Take 1 tablet (54 mg total) by mouth every morning., Disp: 30 tablet, Rfl: 0   omeprazole (PRILOSEC OTC) 20 MG tablet, Take 20 mg by mouth daily., Disp: , Rfl:    rosuvastatin (CRESTOR) 20 MG  tablet, TAKE 1 TABLET(20 MG) BY MOUTH DAILY, Disp: 90 tablet, Rfl: 3   Semaglutide, 2 MG/DOSE, (OZEMPIC, 2 MG/DOSE,) 8 MG/3ML SOPN, Inject 2 mg into the skin once a week., Disp: 9 mL, Rfl: 3   tapentadol HCl (NUCYNTA) 75 MG tablet, Take 1 tablet (75 mg total) by mouth 3 (three) times daily as needed., Disp: 90 tablet, Rfl: 0   vitamin B-12 (CYANOCOBALAMIN) 1000 MCG tablet, Take 1 tablet (1,000 mcg total) by mouth daily., Disp: 90 tablet, Rfl: 1   Vitamin D, Ergocalciferol, (DRISDOL) 1.25 MG (50000 UT) CAPS capsule, Take 1 capsule (50,000 Units total) by mouth every 7 (seven) days., Disp: 12 capsule, Rfl: 0   DULoxetine (CYMBALTA) 60 MG capsule, Take 1 capsule (60 mg total) by mouth 2 (two) times daily., Disp: 180 capsule, Rfl: 4   pregabalin (LYRICA) 200 MG capsule, TAKE (1) CAPSULE TWO TIMES DAILY., Disp: 180 capsule, Rfl: 1  PAST MEDICAL HISTORY: Past Medical History:  Diagnosis Date   Abdominal pain, left lower quadrant 06/06/2010   Abdominal pain, unspecified site 01/19/2009   ADD 10/09/2008   ALLERGIC RHINITIS 10/09/2008   Allergy    ANXIETY DEPRESSION 02/01/2008   Arthritis    hands ?   Blood transfusion without reported diagnosis    Cataract    removed both eyes    COLONIC POLYPS 02/01/2008   DIABETES MELLITUS, TYPE II 05/20/2010   DYSPNEA 03/12/2010   ERECTILE DYSFUNCTION 10/09/2008   ERECTILE DYSFUNCTION, ORGANIC 05/20/2010   FOOT PAIN, LEFT  05/20/2010   GERD 02/01/2008   Guillain Barr syndrome ([place])    HEMORRHOIDS 02/01/2008   HIATAL HERNIA 02/01/2008   HYPERLIPIDEMIA 10/09/2008   HYPERTENSION 10/09/2008   MORTON'S NEUROMA 05/20/2010   Other specified forms of hearing loss 06/27/2009   PERIPHERAL EDEMA 05/20/2010   PERIPHERAL NEUROPATHY 05/20/2010   Sleep apnea    stopped cpap    SLEEP APNEA, OBSTRUCTIVE 02/01/2008   Stricture and stenosis of esophagus 02/02/2008   Type II or unspecified type diabetes mellitus without mention of complication, uncontrolled 11/14/2010   WRIST PAIN, LEFT 12/05/2009    PAST SURGICAL HISTORY: Past Surgical History:  Procedure Laterality Date   CARPAL TUNNEL RELEASE Bilateral    COLONOSCOPY     DENTAL SURGERY     ESOPHAGEAL DILATION  july 2009   ESOPHAGOGASTRODUODENOSCOPY N/A 06/27/2014   Procedure: ESOPHAGOGASTRODUODENOSCOPY (EGD);  Surgeon: Lafayette Dragon, MD;  Location: [facility];  Service: Endoscopy;  Laterality: N/A;   EYE SURGERY     catract surgery on both eyes   POLYPECTOMY     ROTATOR CUFF REPAIR     UPPER GASTROINTESTINAL ENDOSCOPY      FAMILY HISTORY: Family History  Problem Relation Age of Onset   Diabetes Mother    Heart disease Mother    Hyperlipidemia Mother    Depression Mother    Diabetes Brother    Colon cancer Neg Hx    Colon polyps Neg Hx    Esophageal cancer Neg Hx    Rectal cancer Neg Hx    Stomach cancer Neg Hx     SOCIAL HISTORY:  Social History   Socioeconomic History   Marital status: Single    Spouse name: Not on file   Number of children: Not on file   Years of education: Not on file   Highest education level: Not on file  Occupational History   Occupation: Housekeeper [employer]    Employer: [employer]  Tobacco Use   Smoking status: Never  Smokeless tobacco: Never  Vaping Use   Vaping Use: Never used  Substance and Sexual Activity   Alcohol use: Yes    Comment: occasional   Drug use: Yes    Types: Methaqualone   Sexual activity: Yes     Comment: before I got sick  Other Topics Concern   Not on file  Social History Narrative   Not on file   Social Determinants of Health   Financial Resource Strain: Not on file  Food Insecurity: Not on file  Transportation Needs: Not on file  Physical Activity: Not on file  Stress: Not on file  Social Connections: Not on file  Intimate Partner Violence: Not on file     PHYSICAL EXAM  Vitals:   03/11/21 1115  BP: 131/82  Pulse: (!) 110  Weight: 220 lb (99.8 kg)  Height: '5\' 9"'$  (1.753 m)    Body mass index is 32.49 kg/m.   General: The patient is well-developed and well-nourished and in no acute distress  Skin: Extremities are without rash. He has mild pedal edema, slightly worse on the left.    HEENT:   His pharynx is Mallampatti 4.  Neurologic Exam  Mental status: The patient is alert and oriented x 3 at the time of the examination. The patient has apparent normal recent and remote memory, with an apparently normal attention span and concentration ability.   Speech is normal.  Cranial nerves: Extraocular movements are full. Facial strength and sensation are normal..  Trapezius and sternocleidomastoid strength is normal. No dysarthria is noted.   . No obvious hearing deficits are noted.  Motor:  Muscle bulk is normal. Muscle tone is normal. Strength is 5/5 proximally in the arms and legs.  Strength was 4+/5 in the intrinsic hand muscles and the EHL and intrinsic foot muscles.  Sensory: Sensory testing is intact at the wrist but reduced vib (normal touch) at fingertips.   There is mild reduced vibration sensation at the knees, severe loss at ankles; absent at toes.   Touch/pinprick is reduced  mid shin and very reduced in the feet.  Coordination: He has good finger-nose-finger but reduced heel-to-shin bilaterally.  Gait and station: Station is normal.   The gait has a reduced stride.   The tandem gait is poor and wide.. Romberg is positive.   Reflexes: Deep tendon  reflexes are symmetric  ric and 1+ bilaterally at deltoid and trace at knees, absent at the ankles.    DIAGNOSTIC DATA (LABS, IMAGING, TESTING) - I reviewed patient records, labs, notes, testing and imaging myself where available.  Lab Results  Component Value Date   WBC 8.2 07/02/2020   HGB 15.2 07/02/2020   HCT 43.8 07/02/2020   MCV 86.3 07/02/2020   PLT 226.0 07/02/2020      Component Value Date/Time   NA 139 07/02/2020 1458   K 3.9 07/02/2020 1458   CL 100 07/02/2020 1458   CO2 30 07/02/2020 1458   GLUCOSE 155 (H) 07/02/2020 1458   BUN 17 07/02/2020 1458   CREATININE 1.06 07/02/2020 1458   CALCIUM 9.7 07/02/2020 1458   PROT 7.4 07/02/2020 1458   PROT 7.0 08/30/2015 1157   ALBUMIN 4.3 07/02/2020 1458   AST 17 07/02/2020 1458   ALT 17 07/02/2020 1458   ALKPHOS 76 07/02/2020 1458   BILITOT 0.7 07/02/2020 1458   GFRNONAA >90 08/06/2014 0240   GFRAA >90 08/06/2014 0240   Lab Results  Component Value Date   CHOL 180 07/02/2020  HDL 52.50 07/02/2020   LDLCALC 23 11/16/2017   LDLDIRECT 94.0 07/02/2020   TRIG 317.0 (H) 07/02/2020   CHOLHDL 3 07/02/2020   Lab Results  Component Value Date   HGBA1C 7.7 (A) 03/04/2021   Lab Results  Component Value Date   VITAMINB12 177 (L) 07/02/2020   Lab Results  Component Value Date   TSH 1.80 07/02/2020       ASSESSMENT AND PLAN  Diabetic polyneuropathy associated with type 2 diabetes mellitus (HCC)  Chronic pain syndrome  Obstructive sleep apnea  Dysesthesia  Excessive daytime sleepiness  1.  Continue Lyrica and Nucynta and Cymbalta for neuropathic pain. 2.   He has severe OSA and could not tolerate CPAP.   We discussed options including surgery, Inspire and oral appliance.   Weiht loss may help some.   We discussed also not to take any more than the prescribed amount of medication as they may add to the sleep apnea to suppress breathing more  3.    Stay active and exercise as tolerated. 4.    B12 was low in  2021.  Advised to take 1000 mcg daily  Return in 4 months or sooner if there are new or worsening neurologic symptoms.  Vicke Plotner A. [name], MD, PhD [id number], [id number] AM Certified in Neurology, Clinical Neurophysiology, Sleep Medicine, Pain Medicine and Neuroimaging  [facility] [address] [place], [place] [address] [phone]

## 2021-03-16 ENCOUNTER — Other Ambulatory Visit: Payer: Self-pay | Admitting: Endocrinology

## 2021-03-16 DIAGNOSIS — E1142 Type 2 diabetes mellitus with diabetic polyneuropathy: Secondary | ICD-10-CM

## 2021-03-16 DIAGNOSIS — IMO0002 Reserved for concepts with insufficient information to code with codable children: Secondary | ICD-10-CM

## 2021-03-22 ENCOUNTER — Other Ambulatory Visit: Payer: Self-pay | Admitting: Internal Medicine

## 2021-03-22 NOTE — Telephone Encounter (Signed)
Please refill as per office routine med refill policy (all routine meds refilled for 3 mo or monthly per pt preference up to one year from last visit, then month to month grace period for 3 mo, then further med refills will have to be denied)  

## 2021-03-27 ENCOUNTER — Other Ambulatory Visit: Payer: Self-pay | Admitting: Neurology

## 2021-03-27 MED ORDER — TAPENTADOL HCL 75 MG PO TABS: 75.0000 mg | ORAL_TABLET | Freq: Three times a day (TID) | ORAL | 0 refills | Status: DC | PRN

## 2021-03-27 NOTE — Telephone Encounter (Signed)
I have routed this request to Dr Sater for review. The pt is due for the medication and [place] registry was verified.  

## 2021-03-27 NOTE — Telephone Encounter (Signed)
Pt is requesting a refill for tapentadol HCl (NUCYNTA) 75 MG tablet.  Pharmacy: [id number]   

## 2021-04-21 ENCOUNTER — Other Ambulatory Visit: Payer: Self-pay | Admitting: Neurology

## 2021-04-21 ENCOUNTER — Other Ambulatory Visit: Payer: Self-pay | Admitting: Internal Medicine

## 2021-04-21 NOTE — Telephone Encounter (Signed)
Please refill as per office routine med refill policy (all routine meds to be refilled for 3 mo or monthly (per pt preference) up to one year from last visit, then month to month grace period for 3 mo, then further med refills will have to be denied) ? ?

## 2021-04-23 MED ORDER — HYDROCHLOROTHIAZIDE 12.5 MG PO CAPS: ORAL_CAPSULE | ORAL | 0 refills | Status: DC

## 2021-04-29 ENCOUNTER — Other Ambulatory Visit: Payer: Self-pay | Admitting: Neurology

## 2021-04-29 MED ORDER — TAPENTADOL HCL 75 MG PO TABS: 75.0000 mg | ORAL_TABLET | Freq: Three times a day (TID) | ORAL | 0 refills | Status: DC | PRN

## 2021-04-29 NOTE — Telephone Encounter (Signed)
Pt called requesting refill for tapentadol HCl (NUCYNTA) 75 MG tablet. [place] [phone].

## 2021-04-29 NOTE — Telephone Encounter (Signed)
I have routed this request to Dr Sater for review. The pt is due for the medication and [place] registry was verified.  

## 2021-05-28 ENCOUNTER — Other Ambulatory Visit: Payer: Self-pay | Admitting: Neurology

## 2021-05-28 MED ORDER — TAPENTADOL HCL 75 MG PO TABS: 75.0000 mg | ORAL_TABLET | Freq: Three times a day (TID) | ORAL | 0 refills | Status: DC | PRN

## 2021-05-28 NOTE — Telephone Encounter (Signed)
I have routed this request to Dr Sater for review. The pt is due for the medication and [place] registry was verified.  

## 2021-05-28 NOTE — Telephone Encounter (Signed)
Pt request refill for tapentadol HCl (NUCYNTA) 75 MG tablet at [id number]  

## 2021-06-03 ENCOUNTER — Encounter: Payer: Self-pay | Admitting: Internal Medicine

## 2021-06-03 ENCOUNTER — Ambulatory Visit: Payer: Medicare PPO | Admitting: Internal Medicine

## 2021-06-03 ENCOUNTER — Other Ambulatory Visit: Payer: Self-pay

## 2021-06-03 VITALS — BP 110/70 | HR 104 | Ht 69.0 in | Wt 216.0 lb

## 2021-06-03 DIAGNOSIS — I10 Essential (primary) hypertension: Secondary | ICD-10-CM | POA: Diagnosis not present

## 2021-06-03 DIAGNOSIS — E0840 Diabetes mellitus due to underlying condition with diabetic neuropathy, unspecified: Secondary | ICD-10-CM | POA: Diagnosis not present

## 2021-06-03 DIAGNOSIS — E559 Vitamin D deficiency, unspecified: Secondary | ICD-10-CM

## 2021-06-03 DIAGNOSIS — E7800 Pure hypercholesterolemia, unspecified: Secondary | ICD-10-CM | POA: Diagnosis not present

## 2021-06-03 DIAGNOSIS — F988 Other specified behavioral and emotional disorders with onset usually occurring in childhood and adolescence: Secondary | ICD-10-CM

## 2021-06-03 MED ORDER — METHYLPHENIDATE HCL ER (OSM) 54 MG PO TBCR: 54.0000 mg | EXTENDED_RELEASE_TABLET | ORAL | 0 refills | Status: DC

## 2021-06-03 NOTE — Progress Notes (Signed)
Patient ID: Frank [name], male   DOB: 1955-11-03, 65 y.o.   MRN: [id number]        Chief Complaint: follow up HTN, HLD and hyperglycemia , low vit d and ADD       HPI:  Frank [name] is a 65 y.o. male here overall doing ok, Pt denies chest pain, increased sob or doe, wheezing, orthopnea, PND, increased LE swelling, palpitations, dizziness or syncope.   Pt denies polydipsia, polyuria, or low sugar symptoms or new focal neuro s/s.   Pt denies fever, wt loss, night sweats, loss of appetite, or other constitutional symptoms  ADD med working well, asks for refill.  No other new complaints.  Not taking vit d       Wt Readings from Last 3 Encounters:  06/10/21 217 lb 9.6 oz (98.7 kg)  06/03/21 216 lb (98 kg)  03/11/21 220 lb (99.8 kg)   BP Readings from Last 3 Encounters:  06/10/21 122/64  06/03/21 110/70  03/11/21 131/82         Past Medical History:  Diagnosis Date   Abdominal pain, left lower quadrant 06/06/2010   Abdominal pain, unspecified site 01/19/2009   ADD 10/09/2008   ALLERGIC RHINITIS 10/09/2008   Allergy    ANXIETY DEPRESSION 02/01/2008   Arthritis    hands ?   Blood transfusion without reported diagnosis    Cataract    removed both eyes    COLONIC POLYPS 02/01/2008   DIABETES MELLITUS, TYPE II 05/20/2010   DYSPNEA 03/12/2010   ERECTILE DYSFUNCTION 10/09/2008   ERECTILE DYSFUNCTION, ORGANIC 05/20/2010   FOOT PAIN, LEFT 05/20/2010   GERD 02/01/2008   Guillain Barr syndrome ([place])    HEMORRHOIDS 02/01/2008   HIATAL HERNIA 02/01/2008   HYPERLIPIDEMIA 10/09/2008   HYPERTENSION 10/09/2008   MORTON'S NEUROMA 05/20/2010   Other specified forms of hearing loss 06/27/2009   PERIPHERAL EDEMA 05/20/2010   PERIPHERAL NEUROPATHY 05/20/2010   Sleep apnea    stopped cpap    SLEEP APNEA, OBSTRUCTIVE 02/01/2008   Stricture and stenosis of esophagus 02/02/2008   Type II or unspecified type diabetes mellitus without mention of complication, uncontrolled 11/14/2010   WRIST PAIN, LEFT  12/05/2009   Past Surgical History:  Procedure Laterality Date   CARPAL TUNNEL RELEASE Bilateral    COLONOSCOPY     DENTAL SURGERY     ESOPHAGEAL DILATION  july 2009   ESOPHAGOGASTRODUODENOSCOPY N/A 06/27/2014   Procedure: ESOPHAGOGASTRODUODENOSCOPY (EGD);  Surgeon: Lafayette Dragon, MD;  Location: [facility];  Service: Endoscopy;  Laterality: N/A;   EYE SURGERY     catract surgery on both eyes   POLYPECTOMY     ROTATOR CUFF REPAIR     UPPER GASTROINTESTINAL ENDOSCOPY      reports that he has never smoked. He has never used smokeless tobacco. He reports current alcohol use. He reports current drug use. Drug: Methaqualone. family history includes Depression in his mother; Diabetes in his brother and mother; Heart disease in his mother; Hyperlipidemia in his mother. Allergies  Allergen Reactions   Codeine Itching    REACTION: Itching   Influenza Vaccines     Due to hx of GBS   Metformin And Related Other (See Comments)    GI upset, diarrhea   Other Other (See Comments)    Due to hx of GBS- vaccines    Shellfish Allergy    Current Outpatient Medications on File Prior to Visit  Medication Sig Dispense Refill   ACCU-CHEK GUIDE test strip  USE AS DIRECTED ONCE DAILY 100 strip 11   Accu-Chek Softclix Lancets lancets USE AS DIRECTED ONCE DAILY 100 each 11   ARIPiprazole (ABILIFY) 10 MG tablet TAKE 1 TABLET(10 MG) BY MOUTH DAILY 90 tablet 3   aspirin EC 81 MG tablet Take 1 tablet (81 mg total) by mouth daily.     cyclobenzaprine (FLEXERIL) 5 MG tablet TAKE 1 TABLET(5 MG) BY MOUTH AT BEDTIME 30 tablet 11   docusate calcium (SURFAK) 240 MG capsule Take 240 mg by mouth daily.     DULoxetine (CYMBALTA) 60 MG capsule Take 1 capsule (60 mg total) by mouth 2 (two) times daily. 180 capsule 4   famotidine (PEPCID) 40 MG tablet TAKE 1 TABLET(40 MG) BY MOUTH TWICE DAILY 180 tablet 3   hydrochlorothiazide (MICROZIDE) 12.5 MG capsule 1 tab by mouth once daily 90 capsule 0   LORazepam (ATIVAN) 0.5 MG  tablet 1 tab by mouth twice per day as needed 60 tablet 5   omeprazole (PRILOSEC OTC) 20 MG tablet Take 20 mg by mouth daily.     pregabalin (LYRICA) 200 MG capsule TAKE (1) CAPSULE TWO TIMES DAILY. 180 capsule 1   rosuvastatin (CRESTOR) 20 MG tablet TAKE 1 TABLET(20 MG) BY MOUTH DAILY 90 tablet 0   Semaglutide, 2 MG/DOSE, (OZEMPIC, 2 MG/DOSE,) 8 MG/3ML SOPN Inject 2 mg into the skin once a week. 9 mL 3   tapentadol HCl (NUCYNTA) 75 MG tablet Take 1 tablet (75 mg total) by mouth 3 (three) times daily as needed. 90 tablet 0   vitamin B-12 (CYANOCOBALAMIN) 1000 MCG tablet Take 1 tablet (1,000 mcg total) by mouth daily. 90 tablet 1   Vitamin D, Ergocalciferol, (DRISDOL) 1.25 MG (50000 UT) CAPS capsule Take 1 capsule (50,000 Units total) by mouth every 7 (seven) days. 12 capsule 0   No current facility-administered medications on file prior to visit.        ROS:  All others reviewed and negative.  Objective        PE:  BP 110/70 (BP Location: Left Arm, Patient Position: Sitting, Cuff Size: Large)   Pulse (!) 104   Ht 5\' 9"  (1.753 m)   Wt 216 lb (98 kg)   SpO2 96%   BMI 31.90 kg/m                 Constitutional: Pt appears in NAD               HENT: Head: NCAT.                Right Ear: External ear normal.                 Left Ear: External ear normal.                Eyes: . Pupils are equal, round, and reactive to light. Conjunctivae and EOM are normal               Nose: without d/c or deformity               Neck: Neck supple. Gross normal ROM               Cardiovascular: Normal rate and regular rhythm.                 Pulmonary/Chest: Effort normal and breath sounds without rales or wheezing.                Abd:  Soft, NT,  ND, + BS, no organomegaly               Neurological: Pt is alert. At baseline orientation, motor grossly intact               Skin: Skin is warm. No rashes, no other new lesions, LE edema - none               Psychiatric: Pt behavior is normal without agitation    Micro: none  Cardiac tracings I have personally interpreted today:  none  Pertinent Radiological findings (summarize): none   Lab Results  Component Value Date   WBC 8.2 07/02/2020   HGB 15.2 07/02/2020   HCT 43.8 07/02/2020   PLT 226.0 07/02/2020   GLUCOSE 155 (H) 07/02/2020   CHOL 180 07/02/2020   TRIG 317.0 (H) 07/02/2020   HDL 52.50 07/02/2020   LDLDIRECT 94.0 07/02/2020   LDLCALC 23 11/16/2017   ALT 17 07/02/2020   AST 17 07/02/2020   NA 139 07/02/2020   K 3.9 07/02/2020   CL 100 07/02/2020   CREATININE 1.06 07/02/2020   BUN 17 07/02/2020   CO2 30 07/02/2020   TSH 1.80 07/02/2020   PSA 0.30 06/08/2019   HGBA1C 7.4 (A) 06/10/2021   MICROALBUR 1.6 07/02/2020   Assessment/Plan:  Frank [name] is a 65 y.o. White or Caucasian [1] male with  has a past medical history of Abdominal pain, left lower quadrant (06/06/2010), Abdominal pain, unspecified site (01/19/2009), ADD (10/09/2008), ALLERGIC RHINITIS (10/09/2008), Allergy, ANXIETY DEPRESSION (02/01/2008), Arthritis, Blood transfusion without reported diagnosis, Cataract, COLONIC POLYPS (02/01/2008), DIABETES MELLITUS, TYPE II (05/20/2010), DYSPNEA (03/12/2010), ERECTILE DYSFUNCTION (10/09/2008), ERECTILE DYSFUNCTION, ORGANIC (05/20/2010), FOOT PAIN, LEFT (05/20/2010), GERD (02/01/2008), Guillain Barr syndrome ([place]), HEMORRHOIDS (02/01/2008), HIATAL HERNIA (02/01/2008), HYPERLIPIDEMIA (10/09/2008), HYPERTENSION (10/09/2008), MORTON'S NEUROMA (05/20/2010), Other specified forms of hearing loss (06/27/2009), PERIPHERAL EDEMA (05/20/2010), PERIPHERAL NEUROPATHY (05/20/2010), Sleep apnea, SLEEP APNEA, OBSTRUCTIVE (02/01/2008), Stricture and stenosis of esophagus (02/02/2008), Type II or unspecified type diabetes mellitus without mention of complication, uncontrolled (11/14/2010), and WRIST PAIN, LEFT (12/05/2009).  Attention deficit disorder Stable, cont current med tx - adderall  Diabetes mellitus due to underlying condition with diabetic  neuropathy ([place]) Lab Results  Component Value Date   HGBA1C 7.4 (A) 06/10/2021   Mild uncontrolled, pt to continue current medical treatment, to f/u endo as planned   Hyperlipidemia Lab Results  Component Value Date   LDLCALC 23 11/16/2017   Stable, pt to continue current statin crestor 20   Essential hypertension BP Readings from Last 3 Encounters:  06/10/21 122/64  06/03/21 110/70  03/11/21 131/82   Stable, pt to continue medical treatment  - hct   Vitamin D deficiency Last vitamin D Lab Results  Component Value Date   VD25OH 17.64 (L) 07/02/2020   Low, to start oral replacement  Followup: No follow-ups on file.  Cathlean Cower, MD 06/10/2021 12:36 PM [facility]

## 2021-06-10 ENCOUNTER — Other Ambulatory Visit: Payer: Self-pay | Admitting: Endocrinology

## 2021-06-10 ENCOUNTER — Ambulatory Visit: Payer: Medicare PPO | Admitting: Endocrinology

## 2021-06-10 ENCOUNTER — Other Ambulatory Visit: Payer: Self-pay

## 2021-06-10 VITALS — BP 122/64 | HR 97 | Ht 69.0 in | Wt 217.6 lb

## 2021-06-10 DIAGNOSIS — F988 Other specified behavioral and emotional disorders with onset usually occurring in childhood and adolescence: Secondary | ICD-10-CM | POA: Diagnosis not present

## 2021-06-10 DIAGNOSIS — E1142 Type 2 diabetes mellitus with diabetic polyneuropathy: Secondary | ICD-10-CM

## 2021-06-10 DIAGNOSIS — E559 Vitamin D deficiency, unspecified: Secondary | ICD-10-CM | POA: Insufficient documentation

## 2021-06-10 LAB — POCT GLYCOSYLATED HEMOGLOBIN (HGB A1C): Hemoglobin A1C: 7.4 % — AB (ref 4.0–5.6)

## 2021-06-10 MED ORDER — TRESIBA FLEXTOUCH 200 UNIT/ML ~~LOC~~ SOPN: 74.0000 [IU] | PEN_INJECTOR | Freq: Every day | SUBCUTANEOUS | 3 refills | Status: DC

## 2021-06-10 MED ORDER — DAPAGLIFLOZIN PROPANEDIOL 10 MG PO TABS: 10.0000 mg | ORAL_TABLET | Freq: Every day | ORAL | 3 refills | Status: DC

## 2021-06-10 NOTE — Assessment & Plan Note (Signed)
Lab Results  Component Value Date   LDLCALC 23 11/16/2017   Stable, pt to continue current statin crestor 20

## 2021-06-10 NOTE — Assessment & Plan Note (Signed)
Lab Results  Component Value Date   HGBA1C 7.4 (A) 06/10/2021   Mild uncontrolled, pt to continue current medical treatment, to f/u endo as planned

## 2021-06-10 NOTE — Patient Instructions (Addendum)
I have sent 2 prescriptions to your pharmacy: to double the Farxiga, and to decrease the Tresiba to 74 units per day.  Please continue the same Ozempic.   check your blood sugar once a day.  vary the time of day when you check, between before the 3 meals, and at bedtime.  also check if you have symptoms of your blood sugar being too high or too low.  please keep a record of the readings and bring it to your next appointment here (or you can bring the meter itself).  You can write it on any piece of paper.  please call us sooner if your blood sugar goes below 70, or if you have a lot of readings over 200.   Please come back for a follow-up appointment in 3 months.

## 2021-06-10 NOTE — Assessment & Plan Note (Signed)
Last vitamin D Lab Results  Component Value Date   VD25OH 17.64 (L) 07/02/2020   Low, to start oral replacement  

## 2021-06-10 NOTE — Assessment & Plan Note (Signed)
Stable, cont current med tx - adderall 

## 2021-06-10 NOTE — Progress Notes (Signed)
Subjective:    Patient ID: Frank [name], male    DOB: 1955-10-17, 65 y.o.   MRN: [id number]  HPI Pt returns for f/u of diabetes mellitus: DM type: Insulin-requiring type 2 Dx'ed: 5397 Complications: PN and CRI.  Therapy: insulin since 2019, Ozempic, and Farxiga DKA: never Severe hypoglycemia: never.   Pancreatitis: never Pancreatic imaging: normal on 2006 CT.   SDOH: due to memory loss, he is not a candidate for multiple daily injections.   Other: he did not tolerate metformin-XR (GI sxs); he takes QD insulin, due to noncompliance; pt injects his own insulin.   Interval history: He brings a record of his cbg's which I have reviewed today.  He checks fasting only.  cbg's vary from 92-251.  he says he does not miss meds.  pt states he feels well in general.   Past Medical History:  Diagnosis Date   Abdominal pain, left lower quadrant 06/06/2010   Abdominal pain, unspecified site 01/19/2009   ADD 10/09/2008   ALLERGIC RHINITIS 10/09/2008   Allergy    ANXIETY DEPRESSION 02/01/2008   Arthritis    hands ?   Blood transfusion without reported diagnosis    Cataract    removed both eyes    COLONIC POLYPS 02/01/2008   DIABETES MELLITUS, TYPE II 05/20/2010   DYSPNEA 03/12/2010   ERECTILE DYSFUNCTION 10/09/2008   ERECTILE DYSFUNCTION, ORGANIC 05/20/2010   FOOT PAIN, LEFT 05/20/2010   GERD 02/01/2008   Guillain Barr syndrome ([place])    HEMORRHOIDS 02/01/2008   HIATAL HERNIA 02/01/2008   HYPERLIPIDEMIA 10/09/2008   HYPERTENSION 10/09/2008   MORTON'S NEUROMA 05/20/2010   Other specified forms of hearing loss 06/27/2009   PERIPHERAL EDEMA 05/20/2010   PERIPHERAL NEUROPATHY 05/20/2010   Sleep apnea    stopped cpap    SLEEP APNEA, OBSTRUCTIVE 02/01/2008   Stricture and stenosis of esophagus 02/02/2008   Type II or unspecified type diabetes mellitus without mention of complication, uncontrolled 11/14/2010   WRIST PAIN, LEFT 12/05/2009    Past Surgical History:  Procedure Laterality Date    CARPAL TUNNEL RELEASE Bilateral    COLONOSCOPY     DENTAL SURGERY     ESOPHAGEAL DILATION  july 2009   ESOPHAGOGASTRODUODENOSCOPY N/A 06/27/2014   Procedure: ESOPHAGOGASTRODUODENOSCOPY (EGD);  Surgeon: Lafayette Dragon, MD;  Location: [facility];  Service: Endoscopy;  Laterality: N/A;   EYE SURGERY     catract surgery on both eyes   POLYPECTOMY     ROTATOR CUFF REPAIR     UPPER GASTROINTESTINAL ENDOSCOPY      Social History   Socioeconomic History   Marital status: Single    Spouse name: Not on file   Number of children: Not on file   Years of education: Not on file   Highest education level: Not on file  Occupational History   Occupation: Housekeeper [employer]    Employer: [employer]   Tobacco Use   Smoking status: Never   Smokeless tobacco: Never  Vaping Use   Vaping Use: Never used  Substance and Sexual Activity   Alcohol use: Yes    Comment: occasional   Drug use: Yes    Types: Methaqualone   Sexual activity: Yes    Comment: before I got sick  Other Topics Concern   Not on file  Social History Narrative   Not on file   Social Determinants of Health   Financial Resource Strain: Not on file  Food Insecurity: Not on file  Transportation Needs: Not on file  Physical Activity: Not on file  Stress: Not on file  Social Connections: Not on file  Intimate Partner Violence: Not on file    Current Outpatient Medications on File Prior to Visit  Medication Sig Dispense Refill   ACCU-CHEK GUIDE test strip USE AS DIRECTED ONCE DAILY 100 strip 11   Accu-Chek Softclix Lancets lancets USE AS DIRECTED ONCE DAILY 100 each 11   ARIPiprazole (ABILIFY) 10 MG tablet TAKE 1 TABLET(10 MG) BY MOUTH DAILY 90 tablet 3   aspirin EC 81 MG tablet Take 1 tablet (81 mg total) by mouth daily.     cyclobenzaprine (FLEXERIL) 5 MG tablet TAKE 1 TABLET(5 MG) BY MOUTH AT BEDTIME 30 tablet 11   docusate calcium (SURFAK) 240 MG capsule Take 240 mg by mouth daily.     DULoxetine (CYMBALTA) 60 MG  capsule Take 1 capsule (60 mg total) by mouth 2 (two) times daily. 180 capsule 4   famotidine (PEPCID) 40 MG tablet TAKE 1 TABLET(40 MG) BY MOUTH TWICE DAILY 180 tablet 3   hydrochlorothiazide (MICROZIDE) 12.5 MG capsule 1 tab by mouth once daily 90 capsule 0   LORazepam (ATIVAN) 0.5 MG tablet 1 tab by mouth twice per day as needed 60 tablet 5   methylphenidate (CONCERTA) 54 MG PO CR tablet Take 1 tablet (54 mg total) by mouth every morning. 30 tablet 0   omeprazole (PRILOSEC OTC) 20 MG tablet Take 20 mg by mouth daily.     pregabalin (LYRICA) 200 MG capsule TAKE (1) CAPSULE TWO TIMES DAILY. 180 capsule 1   rosuvastatin (CRESTOR) 20 MG tablet TAKE 1 TABLET(20 MG) BY MOUTH DAILY 90 tablet 0   Semaglutide, 2 MG/DOSE, (OZEMPIC, 2 MG/DOSE,) 8 MG/3ML SOPN Inject 2 mg into the skin once a week. 9 mL 3   tapentadol HCl (NUCYNTA) 75 MG tablet Take 1 tablet (75 mg total) by mouth 3 (three) times daily as needed. 90 tablet 0   vitamin B-12 (CYANOCOBALAMIN) 1000 MCG tablet Take 1 tablet (1,000 mcg total) by mouth daily. 90 tablet 1   Vitamin D, Ergocalciferol, (DRISDOL) 1.25 MG (50000 UT) CAPS capsule Take 1 capsule (50,000 Units total) by mouth every 7 (seven) days. 12 capsule 0   No current facility-administered medications on file prior to visit.    Allergies  Allergen Reactions   Codeine Itching    REACTION: Itching   Influenza Vaccines     Due to hx of GBS   Metformin And Related Other (See Comments)    GI upset, diarrhea   Other Other (See Comments)    Due to hx of GBS- vaccines    Shellfish Allergy     Family History  Problem Relation Age of Onset   Diabetes Mother    Heart disease Mother    Hyperlipidemia Mother    Depression Mother    Diabetes Brother    Colon cancer Neg Hx    Colon polyps Neg Hx    Esophageal cancer Neg Hx    Rectal cancer Neg Hx    Stomach cancer Neg Hx     BP 122/64 (BP Location: Right Arm, Patient Position: Sitting, Cuff Size: Large)   Pulse 97   Ht 5'  9" (1.753 m)   Wt 217 lb 9.6 oz (98.7 kg)   SpO2 96%   BMI 32.13 kg/m    Review of Systems He denies hypoglycemia/N/V/HB.      Objective:   Physical Exam    Lab Results  Component Value Date  HGBA1C 7.4 (A) 06/10/2021   Lab Results  Component Value Date   CREATININE 1.06 07/02/2020   BUN 17 07/02/2020   NA 139 07/02/2020   K 3.9 07/02/2020   CL 100 07/02/2020   CO2 30 07/02/2020      Assessment & Plan:  Insulin-requiring type 2 DM: uncontrolled.   Patient Instructions  I have sent 2 prescriptions to your pharmacy: to double the Farxiga, and to decrease the Tresiba to 74 units per day.  Please continue the same Ozempic.   check your blood sugar once a day.  vary the time of day when you check, between before the 3 meals, and at bedtime.  also check if you have symptoms of your blood sugar being too high or too low.  please keep a record of the readings and bring it to your next appointment here (or you can bring the meter itself).  You can write it on any piece of paper.  please call us sooner if your blood sugar goes below 70, or if you have a lot of readings over 200.   Please come back for a follow-up appointment in 3 months.

## 2021-06-10 NOTE — Assessment & Plan Note (Signed)
BP Readings from Last 3 Encounters:  06/10/21 122/64  06/03/21 110/70  03/11/21 131/82   Stable, pt to continue medical treatment  - hct

## 2021-06-20 ENCOUNTER — Other Ambulatory Visit: Payer: Self-pay | Admitting: Internal Medicine

## 2021-06-20 NOTE — Telephone Encounter (Signed)
Please refill as per office routine med refill policy (all routine meds to be refilled for 3 mo or monthly (per pt preference) up to one year from last visit, then month to month grace period for 3 mo, then further med refills will have to be denied) ? ?

## 2021-06-26 ENCOUNTER — Telehealth: Payer: Self-pay | Admitting: Internal Medicine

## 2021-06-26 MED ORDER — METHYLPHENIDATE HCL ER (OSM) 54 MG PO TBCR: 54.0000 mg | EXTENDED_RELEASE_TABLET | ORAL | 0 refills | Status: DC

## 2021-06-26 MED ORDER — METHYLPHENIDATE HCL ER (OSM) 36 MG PO TBCR: 36.0000 mg | EXTENDED_RELEASE_TABLET | Freq: Every day | ORAL | 0 refills | Status: DC

## 2021-06-26 NOTE — Telephone Encounter (Signed)
[place] for the 36 mg concerta instead for now

## 2021-06-26 NOTE — Telephone Encounter (Signed)
Patient notified

## 2021-06-26 NOTE — Addendum Note (Signed)
Addended by: Biagio Borg on: 06/26/2021 01:14 PM   Modules accepted: Orders

## 2021-06-26 NOTE — Telephone Encounter (Signed)
Patient states all [facility] [name] will not have the methylphenidate (CONCERTA) 54 MG PO CR tablet until 2023 per pharmacist. Requesting an alternative.   Please advise.

## 2021-06-27 ENCOUNTER — Telehealth: Payer: Self-pay | Admitting: Endocrinology

## 2021-06-27 DIAGNOSIS — E0840 Diabetes mellitus due to underlying condition with diabetic neuropathy, unspecified: Secondary | ICD-10-CM

## 2021-06-27 NOTE — Telephone Encounter (Signed)
MEDICATION: Semaglutide, 2 MG/DOSE, (OZEMPIC, 2 MG/DOSE,) 8 MG/3ML SOPN   AND    insulin degludec (TRESIBA FLEXTOUCH) 200 UNIT/ML FlexTouch Pen  PHARMACY:  [facility] [place]?  Yes-[facility] is out of stock  IS THIS A 90 DAY SUPPLY : ?  IS PATIENT OUT OF MEDICATION: No  IF NOT; HOW MUCH IS LEFT: 2 pens  LAST APPOINTMENT DATE: @10 /31/2022  NEXT APPOINTMENT DATE:@1 /30/2023  DO WE HAVE YOUR PERMISSION TO LEAVE A DETAILED MESSAGE?: Yes  OTHER COMMENTS:    **Let patient know to contact pharmacy at the end of the day to make sure medication is ready. **  ** Please notify patient to allow 48-72 hours to process**  **Encourage patient to contact the pharmacy for refills or they can request refills through [facility]**

## 2021-06-28 MED ORDER — OZEMPIC (2 MG/DOSE) 8 MG/3ML ~~LOC~~ SOPN: 2.0000 mg | PEN_INJECTOR | SUBCUTANEOUS | 3 refills | Status: DC

## 2021-06-28 MED ORDER — TRESIBA FLEXTOUCH 200 UNIT/ML ~~LOC~~ SOPN: 74.0000 [IU] | PEN_INJECTOR | Freq: Every day | SUBCUTANEOUS | 3 refills | Status: DC

## 2021-06-28 NOTE — Telephone Encounter (Signed)
Pharmacy changed to [facility] and sent 2 RX's Ozempic and [place]

## 2021-06-28 NOTE — Addendum Note (Signed)
Addended by: Sarina Ill on: 06/28/2021 08:50 AM   Modules accepted: Orders

## 2021-07-08 ENCOUNTER — Other Ambulatory Visit: Payer: Self-pay | Admitting: Neurology

## 2021-07-08 MED ORDER — TAPENTADOL HCL 75 MG PO TABS: 75.0000 mg | ORAL_TABLET | Freq: Three times a day (TID) | ORAL | 0 refills | Status: DC | PRN

## 2021-07-08 NOTE — Telephone Encounter (Signed)
Pt request refill for tapentadol HCl (NUCYNTA) 75 MG tablet at [facility]

## 2021-07-20 ENCOUNTER — Other Ambulatory Visit: Payer: Self-pay | Admitting: Internal Medicine

## 2021-07-20 NOTE — Telephone Encounter (Signed)
Please refill as per office routine med refill policy (all routine meds to be refilled for 3 mo or monthly (per pt preference) up to one year from last visit, then month to month grace period for 3 mo, then further med refills will have to be denied) ? ?

## 2021-07-22 ENCOUNTER — Other Ambulatory Visit: Payer: Self-pay | Admitting: Internal Medicine

## 2021-07-22 NOTE — Telephone Encounter (Signed)
Please refill as per office routine med refill policy (all routine meds to be refilled for 3 mo or monthly (per pt preference) up to one year from last visit, then month to month grace period for 3 mo, then further med refills will have to be denied) ? ?

## 2021-07-25 ENCOUNTER — Other Ambulatory Visit: Payer: Self-pay | Admitting: Internal Medicine

## 2021-08-06 ENCOUNTER — Other Ambulatory Visit: Payer: Self-pay | Admitting: Neurology

## 2021-08-06 MED ORDER — TAPENTADOL HCL 75 MG PO TABS: 75.0000 mg | ORAL_TABLET | Freq: Three times a day (TID) | ORAL | 0 refills | Status: DC | PRN

## 2021-08-06 NOTE — Telephone Encounter (Signed)
Pt is needing a refill request for his tapentadol HCl (NUCYNTA) 75 MG tablet sent in to the [facility] on [facility]

## 2021-08-07 ENCOUNTER — Other Ambulatory Visit: Payer: Self-pay | Admitting: Internal Medicine

## 2021-08-07 ENCOUNTER — Other Ambulatory Visit: Payer: Self-pay | Admitting: Neurology

## 2021-08-07 NOTE — Telephone Encounter (Signed)
Please refuse.  Rx was sent yesterday to [facility].

## 2021-08-10 ENCOUNTER — Other Ambulatory Visit: Payer: Self-pay | Admitting: Endocrinology

## 2021-09-03 ENCOUNTER — Other Ambulatory Visit: Payer: Self-pay | Admitting: Neurology

## 2021-09-03 MED ORDER — TAPENTADOL HCL 75 MG PO TABS: ORAL_TABLET | ORAL | 0 refills | Status: DC

## 2021-09-03 NOTE — Telephone Encounter (Signed)
Pt request refill for NUCYNTA 75 MG tablet at [facility]

## 2021-09-09 ENCOUNTER — Ambulatory Visit: Payer: Medicare PPO | Admitting: Endocrinology

## 2021-09-16 ENCOUNTER — Ambulatory Visit: Payer: Medicare PPO | Admitting: Family Medicine

## 2021-09-16 ENCOUNTER — Other Ambulatory Visit: Payer: Self-pay | Admitting: Neurology

## 2021-09-16 ENCOUNTER — Ambulatory Visit: Payer: Medicare PPO | Admitting: Endocrinology

## 2021-09-18 ENCOUNTER — Other Ambulatory Visit: Payer: Self-pay | Admitting: Internal Medicine

## 2021-09-18 NOTE — Telephone Encounter (Signed)
Please refill as per office routine med refill policy (all routine meds to be refilled for 3 mo or monthly (per pt preference) up to one year from last visit, then month to month grace period for 3 mo, then further med refills will have to be denied) ? ?

## 2021-09-23 ENCOUNTER — Ambulatory Visit: Payer: Medicare PPO | Admitting: Endocrinology

## 2021-10-01 ENCOUNTER — Encounter: Payer: Self-pay | Admitting: Gastroenterology

## 2021-10-01 ENCOUNTER — Ambulatory Visit: Payer: Medicare PPO | Admitting: Endocrinology

## 2021-10-01 ENCOUNTER — Other Ambulatory Visit: Payer: Self-pay

## 2021-10-01 VITALS — BP 128/84 | HR 97 | Ht 69.0 in | Wt 214.8 lb

## 2021-10-01 DIAGNOSIS — E1140 Type 2 diabetes mellitus with diabetic neuropathy, unspecified: Secondary | ICD-10-CM

## 2021-10-01 DIAGNOSIS — E0840 Diabetes mellitus due to underlying condition with diabetic neuropathy, unspecified: Secondary | ICD-10-CM

## 2021-10-01 LAB — POCT GLYCOSYLATED HEMOGLOBIN (HGB A1C): Hemoglobin A1C: 7.2 % — AB (ref 4.0–5.6)

## 2021-10-01 MED ORDER — DAPAGLIFLOZIN PROPANEDIOL 10 MG PO TABS: 10.0000 mg | ORAL_TABLET | Freq: Every day | ORAL | 3 refills | Status: DC

## 2021-10-01 MED ORDER — OZEMPIC (2 MG/DOSE) 8 MG/3ML ~~LOC~~ SOPN: 2.0000 mg | PEN_INJECTOR | SUBCUTANEOUS | 3 refills | Status: DC

## 2021-10-01 MED ORDER — TRESIBA FLEXTOUCH 200 UNIT/ML ~~LOC~~ SOPN: 74.0000 [IU] | PEN_INJECTOR | Freq: Every day | SUBCUTANEOUS | 3 refills | Status: DC

## 2021-10-01 NOTE — Progress Notes (Signed)
Subjective:    Patient ID: Frank [name], male    DOB: [birth year], 66 y.o.   MRN: [id number]  HPI Pt returns for f/u of diabetes mellitus: DM type: Insulin-requiring type 2 Dx'ed: 9379 Complications: PN and CRI.  Therapy: insulin since 2019, Ozempic, and Farxiga DKA: never Severe hypoglycemia: never.   Pancreatitis: never Pancreatic imaging: normal on 2006 CT.   SDOH: due to memory loss, he is not a candidate for multiple daily injections.   Other: he did not tolerate metformin-XR (GI sxs); he takes QD insulin, due to noncompliance; pt injects his own insulin.   Interval history: He brings a record of his cbg's which I have reviewed today.  He checks in the afternoon only.  cbg's vary from 89-207.  he says he does not miss meds.  pt states he feels well in general.   Past Medical History:  Diagnosis Date   Abdominal pain, left lower quadrant 06/06/2010   Abdominal pain, unspecified site 01/19/2009   ADD 10/09/2008   ALLERGIC RHINITIS 10/09/2008   Allergy    ANXIETY DEPRESSION 02/01/2008   Arthritis    hands ?   Blood transfusion without reported diagnosis    Cataract    removed both eyes    COLONIC POLYPS 02/01/2008   DIABETES MELLITUS, TYPE II 05/20/2010   DYSPNEA 03/12/2010   ERECTILE DYSFUNCTION 10/09/2008   ERECTILE DYSFUNCTION, ORGANIC 05/20/2010   FOOT PAIN, LEFT 05/20/2010   GERD 02/01/2008   Guillain Barr syndrome ([place])    HEMORRHOIDS 02/01/2008   HIATAL HERNIA 02/01/2008   HYPERLIPIDEMIA 10/09/2008   HYPERTENSION 10/09/2008   MORTON'S NEUROMA 05/20/2010   Other specified forms of hearing loss 06/27/2009   PERIPHERAL EDEMA 05/20/2010   PERIPHERAL NEUROPATHY 05/20/2010   Sleep apnea    stopped cpap    SLEEP APNEA, OBSTRUCTIVE 02/01/2008   Stricture and stenosis of esophagus 02/02/2008   Type II or unspecified type diabetes mellitus without mention of complication, uncontrolled 11/14/2010   WRIST PAIN, LEFT 12/05/2009    Past Surgical History:  Procedure Laterality  Date   CARPAL TUNNEL RELEASE Bilateral    COLONOSCOPY     DENTAL SURGERY     ESOPHAGEAL DILATION  july 2009   ESOPHAGOGASTRODUODENOSCOPY N/A 06/27/2014   Procedure: ESOPHAGOGASTRODUODENOSCOPY (EGD);  Surgeon: Lafayette Dragon, MD;  Location: [facility];  Service: Endoscopy;  Laterality: N/A;   EYE SURGERY     catract surgery on both eyes   POLYPECTOMY     ROTATOR CUFF REPAIR     UPPER GASTROINTESTINAL ENDOSCOPY      Social History   Socioeconomic History   Marital status: Single    Spouse name: Not on file   Number of children: Not on file   Years of education: Not on file   Highest education level: Not on file  Occupational History   Occupation: Housekeeper [employer]    Employer: [employer]  Tobacco Use   Smoking status: Never   Smokeless tobacco: Never  Vaping Use   Vaping Use: Never used  Substance and Sexual Activity   Alcohol use: Yes    Comment: occasional   Drug use: Yes    Types: Methaqualone   Sexual activity: Yes    Comment: before I got sick  Other Topics Concern   Not on file  Social History Narrative   Not on file   Social Determinants of Health   Financial Resource Strain: Not on file  Food Insecurity: Not on file  Transportation Needs: Not  on file  Physical Activity: Not on file  Stress: Not on file  Social Connections: Not on file  Intimate Partner Violence: Not on file    Current Outpatient Medications on File Prior to Visit  Medication Sig Dispense Refill   ACCU-CHEK GUIDE test strip USE AS DIRECTED ONCE DAILY 100 strip 11   Accu-Chek Softclix Lancets lancets USE AS DIRECTED ONCE DAILY 100 each 11   ARIPiprazole (ABILIFY) 10 MG tablet TAKE 1 TABLET(10 MG) BY MOUTH DAILY 90 tablet 1   aspirin EC 81 MG tablet Take 1 tablet (81 mg total) by mouth daily.     cyclobenzaprine (FLEXERIL) 5 MG tablet TAKE 1 TABLET(5 MG) BY MOUTH AT BEDTIME 30 tablet 11   docusate calcium (SURFAK) 240 MG capsule Take 240 mg by mouth daily.     DULoxetine (CYMBALTA)  60 MG capsule Take 1 capsule (60 mg total) by mouth 2 (two) times daily. 180 capsule 4   famotidine (PEPCID) 40 MG tablet TAKE 1 TABLET(40 MG) BY MOUTH TWICE DAILY 180 tablet 3   hydrochlorothiazide (MICROZIDE) 12.5 MG capsule TAKE 1 CAPSULE(12.5 MG) BY MOUTH DAILY 90 capsule 2   Insulin Pen Needle (B-D ULTRAFINE III SHORT PEN) 31G X 8 MM MISC USE AS DIRECTED EVERY DAY WITH TRESIBA PENS 100 each 2   LORazepam (ATIVAN) 0.5 MG tablet TAKE 1 TABLET BY MOUTH TWICE DAILY AS NEEDED 60 tablet 5   methylphenidate (CONCERTA) 36 MG PO CR tablet Take 1 tablet (36 mg total) by mouth daily. 30 tablet 0   omeprazole (PRILOSEC OTC) 20 MG tablet Take 20 mg by mouth daily.     pregabalin (LYRICA) 200 MG capsule TAKE (1) CAPSULE TWO TIMES DAILY. 180 capsule 1   rosuvastatin (CRESTOR) 20 MG tablet TAKE 1 TABLET(20 MG) BY MOUTH DAILY 90 tablet 0   tapentadol HCl (NUCYNTA) 75 MG tablet Take 1 tablet (75 mg total) by mouth 3 (three) times daily as needed. 90 tablet 0   vitamin B-12 (CYANOCOBALAMIN) 1000 MCG tablet Take 1 tablet (1,000 mcg total) by mouth daily. 90 tablet 1   Vitamin D, Ergocalciferol, (DRISDOL) 1.25 MG (50000 UT) CAPS capsule Take 1 capsule (50,000 Units total) by mouth every 7 (seven) days. 12 capsule 0   No current facility-administered medications on file prior to visit.    Allergies  Allergen Reactions   Codeine Itching    REACTION: Itching   Influenza Vaccines     Due to hx of GBS   Metformin And Related Other (See Comments)    GI upset, diarrhea   Other Other (See Comments)    Due to hx of GBS- vaccines    Shellfish Allergy     Family History  Problem Relation Age of Onset   Diabetes Mother    Heart disease Mother    Hyperlipidemia Mother    Depression Mother    Diabetes Brother    Colon cancer Neg Hx    Colon polyps Neg Hx    Esophageal cancer Neg Hx    Rectal cancer Neg Hx    Stomach cancer Neg Hx     BP 128/84    Pulse 97    Ht 5\' 9"  (1.753 m)    Wt 214 lb 12.8 oz (97.4  kg)    SpO2 98%    BMI 31.72 kg/m    Review of Systems He denies hypoglycemia    Objective:   Physical Exam  Lab Results  Component Value Date   CREATININE 1.06 07/02/2020  BUN 17 07/02/2020   NA 139 07/02/2020   K 3.9 07/02/2020   CL 100 07/02/2020   CO2 30 07/02/2020    A1c=7.2%    Assessment & Plan:  Insulin-requiring type 2 DM: well-controlled  Patient Instructions  Please continue the same 3 diabetes medications.   check your blood sugar once a day.  vary the time of day when you check, between before the 3 meals, and at bedtime.  also check if you have symptoms of your blood sugar being too high or too low.  please keep a record of the readings and bring it to your next appointment here (or you can bring the meter itself).  You can write it on any piece of paper.  please call us sooner if your blood sugar goes below 70, or if you have a lot of readings over 200.   Please come back for a follow-up appointment in May.

## 2021-10-01 NOTE — Patient Instructions (Addendum)
Please continue the same 3 diabetes medications.   check your blood sugar once a day.  vary the time of day when you check, between before the 3 meals, and at bedtime.  also check if you have symptoms of your blood sugar being too high or too low.  please keep a record of the readings and bring it to your next appointment here (or you can bring the meter itself).  You can write it on any piece of paper.  please call us sooner if your blood sugar goes below 70, or if you have a lot of readings over 200.   Please come back for a follow-up appointment in May.

## 2021-10-03 ENCOUNTER — Other Ambulatory Visit: Payer: Self-pay | Admitting: Neurology

## 2021-10-03 ENCOUNTER — Telehealth: Payer: Self-pay | Admitting: Neurology

## 2021-10-03 NOTE — Telephone Encounter (Signed)
Pt request refill for tapentadol HCl (NUCYNTA) 75 MG tablet at [facility]

## 2021-10-03 NOTE — Telephone Encounter (Signed)
Per another phone note, Pt request refill for tapentadol HCl (NUCYNTA) 75 MG tablet at [facility].   The Nucynta is what was sent to Dr Jaynee Eagles to be filled since the other two medications are not due.

## 2021-10-03 NOTE — Telephone Encounter (Signed)
Pt request refill for pregabalin (LYRICA) 200 MG capsule and DULoxetine (CYMBALTA) 60 MG capsule at [place] #[id number]

## 2021-10-03 NOTE — Telephone Encounter (Signed)
I have routed this request to Dr.Ahern (Dr [name] is out)  for review. The pt is due for the medication and [place] registry was verified.

## 2021-10-03 NOTE — Telephone Encounter (Signed)
Per another phone note, Pt request refill for tapentadol HCl (NUCYNTA) 75 MG tablet at [facility].    The Nucynta is what was sent to Dr Jaynee Eagles to be filled since the other two medications are not due.

## 2021-10-05 ENCOUNTER — Other Ambulatory Visit: Payer: Self-pay | Admitting: Neurology

## 2021-10-07 MED ORDER — DULOXETINE HCL 60 MG PO CPEP: 60.0000 mg | ORAL_CAPSULE | Freq: Two times a day (BID) | ORAL | 4 refills | Status: DC

## 2021-10-07 MED ORDER — PREGABALIN 200 MG PO CAPS: ORAL_CAPSULE | ORAL | 1 refills | Status: DC

## 2021-10-07 NOTE — Telephone Encounter (Signed)
Controlled Drug Registry checked:  Nucynta 75mg  last filled on 09/04/21 for #90 (30 day supply).  Last visit: 03/11/2021 Next visit: 12/02/2021

## 2021-10-27 ENCOUNTER — Other Ambulatory Visit: Payer: Self-pay | Admitting: Neurology

## 2021-10-29 NOTE — Telephone Encounter (Signed)
Last OV was on 03/11/21.  ?Next OV is scheduled for 12/02/21 .  ?Last RX was written on 10/07/21 for 90 tabs.  ? ?[place] Drug Database has been reviewed. Have changed fill date for compliance.  ?

## 2021-11-04 ENCOUNTER — Telehealth: Payer: Self-pay | Admitting: Neurology

## 2021-11-04 NOTE — Telephone Encounter (Signed)
Reviewed pt chart. Dr. [name] already e-scribed refill to [facility] for [place] on 11/01/21. ?

## 2021-11-04 NOTE — Telephone Encounter (Signed)
Pt is needing a refill request for his tapentadol HCl (NUCYNTA) 75 MG tablet sent in to the [facility] on [address] [place].  ?

## 2021-11-25 ENCOUNTER — Encounter: Payer: Self-pay | Admitting: Gastroenterology

## 2021-12-02 ENCOUNTER — Encounter: Payer: Self-pay | Admitting: Family Medicine

## 2021-12-02 ENCOUNTER — Ambulatory Visit: Payer: Medicare PPO | Admitting: Family Medicine

## 2021-12-02 VITALS — BP 117/75 | HR 97 | Ht 69.0 in | Wt 216.0 lb

## 2021-12-02 DIAGNOSIS — G4733 Obstructive sleep apnea (adult) (pediatric): Secondary | ICD-10-CM | POA: Diagnosis not present

## 2021-12-02 DIAGNOSIS — R208 Other disturbances of skin sensation: Secondary | ICD-10-CM

## 2021-12-02 DIAGNOSIS — G894 Chronic pain syndrome: Secondary | ICD-10-CM

## 2021-12-02 DIAGNOSIS — E1142 Type 2 diabetes mellitus with diabetic polyneuropathy: Secondary | ICD-10-CM

## 2021-12-02 DIAGNOSIS — G4719 Other hypersomnia: Secondary | ICD-10-CM

## 2021-12-02 MED ORDER — TAPENTADOL HCL 75 MG PO TABS: ORAL_TABLET | ORAL | 0 refills | Status: DC

## 2021-12-02 NOTE — Progress Notes (Signed)
? ? ?PATIENT: Frank [name] ?DOB: Nov 24, 1955 ? ?REASON FOR VISIT: follow up ?HISTORY FROM: patient ? ?Chief Complaint  ?Patient presents with  ? Follow-up  ?  Rm 12, alone. Here for 6 month f/u for diabetic polyneuropathy. No change in sx since last ov.   ?  ?HISTORY OF PRESENT ILLNESS: ? ?12/02/21 ALL:  ?He reports symptoms are stable. He continues duloxetine, Nucynta '75mg'$  TID (11/06/2021) and Lyrica '200mg'$  BID (last filled 10/07/2021). Pain is about the same. No significant changes since last visit. Abilify '10mg'$  daily and duloxetine '60mg'$  BID help with mood. PCP writes lorazepam 0.'5mg'$  up to BID (usually takes '1mg'$  at bedtime). He has severe OSA but can not tolerate PAP therapy. He continues to follow up closely with PCP and endo. Last A1C was 7.2 ? ?04/09/2021 RS:  ?Frank [name] is a 66 year old man with a chronic peripheral neuropathy who had superimposed Guillain Barr? syndrome in November 2015 and has had severe neuropathic pain ?  ?Update 03/11/2021: ?He feels he pain is doing about the same.    It is mostly legs, feet and hands.  Pain is mostly from diabetic polyneuropathy.  He also has carpal tunnel syndrome.  He also feels his muscles are stiff, .   He is on Nucynta 75 mg tid, Lyrica 200 mg po bid and Cymbalta 60 mg po bid.    His PCP prescribes lorazepam 0.5 mg bid.   He no longer takes methylphenidate ER 54 qAM  ?  ?The [employer] shows good compliance.      ?  ?He has a lot of numbness in his feet.   No sores.    No falls.   He needs to hold something if eyes are closed ?  ?He is sleeping well.   He has OSA (severe 69.3) but was unable to tolerate CPAP. He has no major weight change since last visit.   He wakes up a couple times a night due to nocturia but quickly falls back asleep..  We discussed he has severe OSA but did not tolerate CPAP and is not interested in surgery or oral appliance.  ?  ?  ?EPWORTH SLEEPINESS SCALE 03/11/2021 ?  ?On a scale of 0 - 3 what is the chance of dozing: ?  ?Sitting and  Reading:                           1 ?Watching TV:                                      3 ?Sitting inactive in a public place:        2 ?Passenger in car for one hour:           3 ?Lying down to rest in the afternoon:   3 ?Sitting and talking to someone:          0 ?Sitting quietly after lunch:                   3 ?In a car, stopped in traffic:                  0  Does not drive ?  ?Total (out of 24):   15  /24 moderate ESS ?  ? B12 was low 2021 but he never supplemented.   ?  ?  ?GBS History:  The initial episode of GBS was treated with plasmapheresis. He reports that the diagnosis took a couple weeks and he had several emergency room visits. Initially he was sent to a skilled nursing facility. He was reporting a lot of back pain alongside the weakness. A lumbar puncture was eventually done showing high protein which led to the dye diagnosis of GBS.  He had severe weakness at the peak and required intubation for respiratory support. At that time, he was unable to move his legs and could barely move his arms. He improved quite a bit after plasmapheresis and was discharged. However, a couple days after discharge he began to feel weak again and return to the emergency room. He was recently admitted for recurrent GBS. For that hospital stay he did not require intubation but did have a facemask oxygen. He also was treated with IVIG the second time around. He improved and was discharged home. He did outpatient physical therapy. He has had a fairly good in movement of his strength and is able to walk independently. However, he has had more dysesthetic pain.   With the exception of more pain, he otherwise feels very close to his pre-GBS baseline.   Of note, he had a flu shot a few weeks before the first episode of GBS. ?  ?MRI's from 06/2014 and 07/2014:    Cervical MRI shows spinal stenosis at C4-C5, C5-C6 and C6-C7. There is no spinal cord compression. Thoracic spine shows T8-T9 disc extrusion to the left that does not  cause spinal cord compression. There are mild degenerative changes in the lumbar spine. Contrast was not used.   I also reviewed many of the notes and labs from his hospital stay. CSF Protein was greatly elevated at 118 and the CSF white blood cell count was 1. ? ?REVIEW OF SYSTEMS: Out of a complete 14 system review of symptoms, the patient complains only of the following symptoms, neuropathy, chronic pain and all other reviewed systems are negative. ? ?ALLERGIES: ?Allergies  ?Allergen Reactions  ? Codeine Itching  ?  REACTION: Itching  ? Influenza Vaccines   ?  Due to hx of GBS  ? Metformin And Related Other (See Comments)  ?  GI upset, diarrhea  ? Other Other (See Comments)  ?  Due to hx of GBS- vaccines   ? Shellfish Allergy   ? ? ?HOME MEDICATIONS: ?Outpatient Medications Prior to Visit  ?Medication Sig Dispense Refill  ? ACCU-CHEK GUIDE test strip USE AS DIRECTED ONCE DAILY 100 strip 11  ? Accu-Chek Softclix Lancets lancets USE AS DIRECTED ONCE DAILY 100 each 11  ? ARIPiprazole (ABILIFY) 10 MG tablet TAKE 1 TABLET(10 MG) BY MOUTH DAILY 90 tablet 1  ? aspirin EC 81 MG tablet Take 1 tablet (81 mg total) by mouth daily.    ? cyclobenzaprine (FLEXERIL) 5 MG tablet TAKE 1 TABLET(5 MG) BY MOUTH AT BEDTIME 30 tablet 11  ? dapagliflozin propanediol (FARXIGA) 10 MG TABS tablet Take 1 tablet (10 mg total) by mouth daily before breakfast. 90 tablet 3  ? docusate calcium (SURFAK) 240 MG capsule Take 240 mg by mouth daily.    ? DULoxetine (CYMBALTA) 60 MG capsule Take 1 capsule (60 mg total) by mouth 2 (two) times daily. 180 capsule 4  ? famotidine (PEPCID) 40 MG tablet TAKE 1 TABLET(40 MG) BY MOUTH TWICE DAILY 180 tablet 3  ? hydrochlorothiazide (MICROZIDE) 12.5 MG capsule TAKE 1 CAPSULE(12.5 MG) BY MOUTH DAILY 90 capsule 2  ? insulin degludec (TRESIBA  FLEXTOUCH) 200 UNIT/ML FlexTouch Pen Inject 74 Units into the skin daily. 42 mL 3  ? Insulin Pen Needle (B-D ULTRAFINE III SHORT PEN) 31G X 8 MM MISC USE AS DIRECTED EVERY  DAY WITH TRESIBA PENS 100 each 2  ? LORazepam (ATIVAN) 0.5 MG tablet TAKE 1 TABLET BY MOUTH TWICE DAILY AS NEEDED 60 tablet 5  ? methylphenidate (CONCERTA) 36 MG PO CR tablet Take 1 tablet (36 mg total) by mouth daily. 30 tablet 0  ? omeprazole (PRILOSEC OTC) 20 MG tablet Take 20 mg by mouth daily.    ? pregabalin (LYRICA) 200 MG capsule TAKE (1) CAPSULE TWO TIMES DAILY. 180 capsule 1  ? rosuvastatin (CRESTOR) 20 MG tablet TAKE 1 TABLET(20 MG) BY MOUTH DAILY 90 tablet 0  ? Semaglutide, 2 MG/DOSE, (OZEMPIC, 2 MG/DOSE,) 8 MG/3ML SOPN Inject 2 mg into the skin once a week. 9 mL 3  ? vitamin B-12 (CYANOCOBALAMIN) 1000 MCG tablet Take 1 tablet (1,000 mcg total) by mouth daily. 90 tablet 1  ? Vitamin D, Ergocalciferol, (DRISDOL) 1.25 MG (50000 UT) CAPS capsule Take 1 capsule (50,000 Units total) by mouth every 7 (seven) days. 12 capsule 0  ? tapentadol HCl (NUCYNTA) 75 MG tablet TAKE ONE TABLET BY MOUTH THREE TIMES DAILY AS NEEDED 90 tablet 0  ? ?No facility-administered medications prior to visit.  ? ? ?PAST MEDICAL HISTORY: ?Past Medical History:  ?Diagnosis Date  ? Abdominal pain, left lower quadrant 06/06/2010  ? Abdominal pain, unspecified site 01/19/2009  ? ADD 10/09/2008  ? ALLERGIC RHINITIS 10/09/2008  ? Allergy   ? ANXIETY DEPRESSION 02/01/2008  ? Arthritis   ? hands ?  ? Blood transfusion without reported diagnosis   ? Cataract   ? removed both eyes   ? COLONIC POLYPS 02/01/2008  ? DIABETES MELLITUS, TYPE II 05/20/2010  ? DYSPNEA 03/12/2010  ? ERECTILE DYSFUNCTION 10/09/2008  ? ERECTILE DYSFUNCTION, ORGANIC 05/20/2010  ? FOOT PAIN, LEFT 05/20/2010  ? GERD 02/01/2008  ? Guillain Barr? syndrome ([place])   ? HEMORRHOIDS 02/01/2008  ? HIATAL HERNIA 02/01/2008  ? HYPERLIPIDEMIA 10/09/2008  ? HYPERTENSION 10/09/2008  ? MORTON'S NEUROMA 05/20/2010  ? Other specified forms of hearing loss 06/27/2009  ? PERIPHERAL EDEMA 05/20/2010  ? PERIPHERAL NEUROPATHY 05/20/2010  ? Sleep apnea   ? stopped cpap   ? SLEEP APNEA, OBSTRUCTIVE 02/01/2008  ?  Stricture and stenosis of esophagus 02/02/2008  ? Type II or unspecified type diabetes mellitus without mention of complication, uncontrolled 11/14/2010  ? WRIST PAIN, LEFT 12/05/2009  ? ? ?PAST SURGICAL HIST

## 2021-12-02 NOTE — Patient Instructions (Signed)
Below is our plan:  We will continue current treatment plan  Please make sure you are staying well hydrated. I recommend 50-60 ounces daily. Well balanced diet and regular exercise encouraged. Consistent sleep schedule with 6-8 hours recommended.   Please continue follow up with care team as directed.   Follow up with Dr Sater in 4-6 months   You may receive a survey regarding today's visit. I encourage you to leave honest feed back as I do use this information to improve patient care. Thank you for seeing me today!    

## 2021-12-04 ENCOUNTER — Other Ambulatory Visit: Payer: Self-pay | Admitting: Endocrinology

## 2021-12-04 DIAGNOSIS — E1165 Type 2 diabetes mellitus with hyperglycemia: Secondary | ICD-10-CM

## 2021-12-05 ENCOUNTER — Ambulatory Visit: Payer: Medicare PPO | Admitting: Endocrinology

## 2021-12-10 ENCOUNTER — Other Ambulatory Visit: Payer: Self-pay

## 2021-12-10 ENCOUNTER — Other Ambulatory Visit: Payer: Self-pay | Admitting: Internal Medicine

## 2021-12-10 MED ORDER — TAPENTADOL HCL 75 MG PO TABS: ORAL_TABLET | ORAL | 0 refills | Status: DC

## 2021-12-10 NOTE — Telephone Encounter (Signed)
Please refill as per office routine med refill policy (all routine meds to be refilled for 3 mo or monthly (per pt preference) up to one year from last visit, then month to month grace period for 3 mo, then further med refills will have to be denied) ? ?

## 2021-12-10 NOTE — Progress Notes (Signed)
Last OV was on 12/02/21.  ?Next OV is scheduled for 06/30/22.  ?Last RX was written on 11/07/21 for 90 tabs.  ? ?[place] Drug Database has been [place].  ?

## 2021-12-18 ENCOUNTER — Other Ambulatory Visit: Payer: Self-pay

## 2021-12-18 DIAGNOSIS — E0840 Diabetes mellitus due to underlying condition with diabetic neuropathy, unspecified: Secondary | ICD-10-CM

## 2021-12-18 MED ORDER — TRESIBA FLEXTOUCH 200 UNIT/ML ~~LOC~~ SOPN: 74.0000 [IU] | PEN_INJECTOR | Freq: Every day | SUBCUTANEOUS | 3 refills | Status: DC

## 2021-12-18 MED ORDER — OZEMPIC (2 MG/DOSE) 8 MG/3ML ~~LOC~~ SOPN: 2.0000 mg | PEN_INJECTOR | SUBCUTANEOUS | 3 refills | Status: DC

## 2021-12-20 ENCOUNTER — Other Ambulatory Visit: Payer: Self-pay | Admitting: Neurology

## 2021-12-20 ENCOUNTER — Other Ambulatory Visit: Payer: Self-pay | Admitting: Internal Medicine

## 2021-12-20 NOTE — Telephone Encounter (Signed)
Please refill as per office routine med refill policy (all routine meds to be refilled for 3 mo or monthly (per pt preference) up to one year from last visit, then month to month grace period for 3 mo, then further med refills will have to be denied) ? ?

## 2021-12-26 ENCOUNTER — Ambulatory Visit (AMBULATORY_SURGERY_CENTER): Payer: Medicare PPO | Admitting: *Deleted

## 2021-12-26 ENCOUNTER — Other Ambulatory Visit (INDEPENDENT_AMBULATORY_CARE_PROVIDER_SITE_OTHER): Payer: Medicare PPO

## 2021-12-26 VITALS — Ht 68.5 in | Wt 220.0 lb

## 2021-12-26 DIAGNOSIS — Z794 Long term (current) use of insulin: Secondary | ICD-10-CM | POA: Diagnosis not present

## 2021-12-26 DIAGNOSIS — Z86010 Personal history of colonic polyps: Secondary | ICD-10-CM

## 2021-12-26 DIAGNOSIS — E1165 Type 2 diabetes mellitus with hyperglycemia: Secondary | ICD-10-CM

## 2021-12-26 LAB — COMPREHENSIVE METABOLIC PANEL
ALT: 15 U/L (ref 0–53)
AST: 16 U/L (ref 0–37)
Albumin: 4.4 g/dL (ref 3.5–5.2)
Alkaline Phosphatase: 80 U/L (ref 39–117)
BUN: 17 mg/dL (ref 6–23)
CO2: 33 mEq/L — ABNORMAL HIGH (ref 19–32)
Calcium: 10.1 mg/dL (ref 8.4–10.5)
Chloride: 99 mEq/L (ref 96–112)
Creatinine, Ser: 1.28 mg/dL (ref 0.40–1.50)
GFR: 57.56 mL/min — ABNORMAL LOW (ref >60.00)
Glucose, Bld: 247 mg/dL — ABNORMAL HIGH (ref 70–99)
Potassium: 4.1 mEq/L (ref 3.5–5.1)
Sodium: 139 mEq/L (ref 135–145)
Total Bilirubin: 0.8 mg/dL (ref 0.2–1.2)
Total Protein: 7.6 g/dL (ref 6.0–8.3)

## 2021-12-26 LAB — LIPID PANEL
Cholesterol: 104 mg/dL (ref 0–200)
HDL: 41.5 mg/dL (ref >39.00)
LDL Cholesterol: 33 mg/dL (ref 0–99)
NonHDL: 62.87
Total CHOL/HDL Ratio: 3
Triglycerides: 150 mg/dL — ABNORMAL HIGH (ref 0.0–149.0)
VLDL: 30 mg/dL (ref 0.0–40.0)

## 2021-12-26 LAB — HEMOGLOBIN A1C: Hgb A1c MFr Bld: 7.6 % — ABNORMAL HIGH (ref 4.6–6.5)

## 2021-12-26 MED ORDER — PLENVU 140 G PO SOLR: 1.0000 | ORAL | 0 refills | Status: DC

## 2021-12-26 NOTE — Progress Notes (Signed)
No egg or soy allergy known to patient  No issues known to pt with past sedation with any surgeries or procedures Patient denies ever being told they had issues or difficulty with intubation  No FH of Malignant Hyperthermia Pt is not on diet pills Pt is not on  home 02  Pt is not on blood thinners  Pt denies issues with constipation  No A fib or A flutter  Plenvu Coupon to pt in PV today , Code to Pharmacy and  NO PA's for preps discussed with pt In PV today  Discussed with pt there will be an out-of-pocket cost for prep and that varies from $0 to 70 +  dollars - pt verbalized understanding    PV completed over the phone. Pt verified name, DOB, address and insurance during PV today.  Pt mailed instruction packet with copy of consent form to read and not return, and instructions.  Pt encouraged to call with questions or issues.  If pt has My chart, procedure instructions sent via My Chart  Insurance confirmed with pt at [facility] today

## 2021-12-30 ENCOUNTER — Ambulatory Visit: Payer: Medicare PPO | Admitting: Endocrinology

## 2022-01-07 ENCOUNTER — Other Ambulatory Visit: Payer: Self-pay | Admitting: Family Medicine

## 2022-01-07 ENCOUNTER — Ambulatory Visit: Payer: Medicare PPO | Admitting: Endocrinology

## 2022-01-07 ENCOUNTER — Encounter: Payer: Self-pay | Admitting: Endocrinology

## 2022-01-07 VITALS — BP 124/78 | HR 87 | Ht 68.5 in | Wt 211.4 lb

## 2022-01-07 DIAGNOSIS — E1165 Type 2 diabetes mellitus with hyperglycemia: Secondary | ICD-10-CM

## 2022-01-07 DIAGNOSIS — Z794 Long term (current) use of insulin: Secondary | ICD-10-CM

## 2022-01-07 DIAGNOSIS — E1142 Type 2 diabetes mellitus with diabetic polyneuropathy: Secondary | ICD-10-CM | POA: Diagnosis not present

## 2022-01-07 MED ORDER — TAPENTADOL HCL 75 MG PO TABS: ORAL_TABLET | ORAL | 0 refills | Status: DC

## 2022-01-07 NOTE — Telephone Encounter (Signed)
Pt called needing a refill on his tapentadol HCl (NUCYNTA) 75 MG tablet and his pregabalin (LYRICA) 200 MG capsule Pt is needing them sent to the [facility] on [address] Call was disconnected in the middle of confirming pharmacy. If pt calls back please inform that they are being sent to the [facility] on [address].

## 2022-01-07 NOTE — Patient Instructions (Addendum)
Check blood sugars on waking up 3 days a week  Also check blood sugars about 2 hours after meals and do this after different meals by rotation  Recommended blood sugar levels on waking up are 90-130 and about 2 hours after meal is 130-180  Please bring your blood sugar monitor to each visit, thank you  Get Eye exam  Take Wilder Glade in am

## 2022-01-07 NOTE — Progress Notes (Signed)
Patient ID: Frank [name], male   DOB: Jul 29, 1956, 66 y.o.   MRN: [id number]           Reason for Appointment: Type II Diabetes follow-up   History of Present Illness   Diagnosis date: 2012  Previous history:  His A1c has ranged between 7 and 10.8 since 2018 Farxiga was started in 2022, was on Ozempic since 7/22, previously he took Trulicity for a year  Recent history:     Non-insulin hypoglycemic drugs: Farxiga 10 mg daily, Ozempic 2 mg weekly     Insulin regimen: Tresiba 74 units daily           Side effects from medications: Diarrhea from metformin ER  Current self management, blood sugar patterns and problems identified:  A1c is 7.6 He does not remember what his blood sugars are and he thinks most of his monitoring is done before dinnertime Yesterday his blood sugar was 137 before dinner at home Although he is not always eating breakfast he may have had some cereal in the morning before his labs when he had a glucose of 247 His weight has continued to go up He has not been able to exercise because of balance issues He takes his insulin and Ozempic regularly and he thinks he takes his [place] at night, prescription history indicates regular refills Does not think he has any symptoms of low sugars Generally avoiding fast food but he thinks that his blood sugars may go up when he eats [place] food  Exercise: none   Diet management: Sometimes will have special K cereal in the morning otherwise skipping breakfast, usually avoiding fast food and sweet drinks      Monitors blood glucose: Once a day.    Glucometer: One Touch.           Blood Glucose readings not available  Hypoglycemia:  none                        Dietician visit: Most recent: 2019  Weight history:  Wt Readings from Last 3 Encounters:  01/07/22 211 lb 6.4 oz (95.9 kg)  12/26/21 220 lb (99.8 kg)  12/02/21 216 lb (98 kg)            Diabetes labs:  Lab Results  Component Value Date    HGBA1C 7.6 (H) 12/26/2021   HGBA1C 7.2 (A) 10/01/2021   HGBA1C 7.4 (A) 06/10/2021   Lab Results  Component Value Date   MICROALBUR 1.6 07/02/2020   LDLCALC 33 12/26/2021   CREATININE 1.28 12/26/2021     Allergies as of 01/07/2022       Reactions   Codeine Itching   REACTION: Itching   Influenza Vaccines    Due to hx of GBS   Metformin And Related Other (See Comments)   GI upset, diarrhea   Other Other (See Comments)   Due to hx of GBS- vaccines    Shellfish Allergy         Medication List        Accurate as of Jan 07, 2022 10:10 AM. If you have any questions, ask your nurse or doctor.          STOP taking these medications    aspirin EC 81 MG tablet Stopped by: Elayne Snare, MD   docusate calcium 240 MG capsule Commonly known as: SURFAK Stopped by: Elayne Snare, MD   famotidine 40 MG tablet Commonly known as: PEPCID Stopped by: Elayne Snare, MD  TAKE these medications    Accu-Chek Guide test strip Generic drug: glucose blood USE AS DIRECTED ONCE DAILY   Accu-Chek Softclix Lancets lancets USE AS DIRECTED ONCE DAILY   ARIPiprazole 10 MG tablet Commonly known as: ABILIFY TAKE 1 TABLET(10 MG) BY MOUTH DAILY   B-D ULTRAFINE III SHORT PEN 31G X 8 MM Misc Generic drug: Insulin Pen Needle USE AS DIRECTED EVERY DAY WITH TRESIBA PENS   cyclobenzaprine 5 MG tablet Commonly known as: FLEXERIL TAKE 1 TABLET(5 MG) BY MOUTH AT BEDTIME   dapagliflozin propanediol 10 MG Tabs tablet Commonly known as: Farxiga Take 1 tablet (10 mg total) by mouth daily before breakfast.   DULoxetine 60 MG capsule Commonly known as: CYMBALTA TAKE ONE CAPSULE TWICE DAILY   hydrochlorothiazide 12.5 MG capsule Commonly known as: MICROZIDE TAKE 1 CAPSULE(12.5 MG) BY MOUTH DAILY   LORazepam 0.5 MG tablet Commonly known as: ATIVAN TAKE 1 TABLET BY MOUTH TWICE DAILY AS NEEDED   methylphenidate 36 MG CR tablet Commonly known as: Concerta Take 1 tablet (36 mg total) by  mouth daily.   omeprazole 20 MG tablet Commonly known as: PRILOSEC OTC Take 20 mg by mouth daily.   Ozempic (2 MG/DOSE) 8 MG/3ML Sopn Generic drug: Semaglutide (2 MG/DOSE) Inject 2 mg into the skin once a week.   Plenvu 140 g Solr Generic drug: PEG-KCl-NaCl-NaSulf-Na Asc-C Take 1 kit by mouth as directed.   pregabalin 200 MG capsule Commonly known as: Lyrica TAKE (1) CAPSULE TWO TIMES DAILY.   rosuvastatin 20 MG tablet Commonly known as: CRESTOR TAKE 1 TABLET(20 MG) BY MOUTH DAILY   tapentadol HCl 75 MG tablet Commonly known as: Nucynta TAKE ONE TABLET BY MOUTH THREE TIMES DAILY AS NEEDED   Tresiba FlexTouch 200 UNIT/ML FlexTouch Pen Generic drug: insulin degludec Inject 74 Units into the skin daily.   vitamin B-12 1000 MCG tablet Commonly known as: CYANOCOBALAMIN Take 1 tablet (1,000 mcg total) by mouth daily.   Vitamin D (Ergocalciferol) 1.25 MG (50000 UNIT) Caps capsule Commonly known as: DRISDOL Take 1 capsule (50,000 Units total) by mouth every 7 (seven) days.        Allergies:  Allergies  Allergen Reactions   Codeine Itching    REACTION: Itching   Influenza Vaccines     Due to hx of GBS   Metformin And Related Other (See Comments)    GI upset, diarrhea   Other Other (See Comments)    Due to hx of GBS- vaccines    Shellfish Allergy     Past Medical History:  Diagnosis Date   Abdominal pain, left lower quadrant 06/06/2010   Abdominal pain, unspecified site 01/19/2009   ADD 10/09/2008   ALLERGIC RHINITIS 10/09/2008   Allergy    ANXIETY DEPRESSION 02/01/2008   Arthritis    hands ?   Blood transfusion without reported diagnosis    Cataract    removed both eyes    COLONIC POLYPS 02/01/2008   DIABETES MELLITUS, TYPE II 05/20/2010   DYSPNEA 03/12/2010   ERECTILE DYSFUNCTION 10/09/2008   ERECTILE DYSFUNCTION, ORGANIC 05/20/2010   FOOT PAIN, LEFT 05/20/2010   GERD 02/01/2008   Guillain Barr syndrome ([place])    HEMORRHOIDS 02/01/2008   HIATAL HERNIA  02/01/2008   HYPERLIPIDEMIA 10/09/2008   HYPERTENSION 10/09/2008   MORTON'S NEUROMA 05/20/2010   Other specified forms of hearing loss 06/27/2009   PERIPHERAL EDEMA 05/20/2010   PERIPHERAL NEUROPATHY 05/20/2010   Sleep apnea    stopped cpap    SLEEP APNEA, OBSTRUCTIVE  02/01/2008   Stricture and stenosis of esophagus 02/02/2008   Type II or unspecified type diabetes mellitus without mention of complication, uncontrolled 11/14/2010   WRIST PAIN, LEFT 12/05/2009    Past Surgical History:  Procedure Laterality Date   CARPAL TUNNEL RELEASE Bilateral    COLONOSCOPY     DENTAL SURGERY     ESOPHAGEAL DILATION  july 2009   ESOPHAGOGASTRODUODENOSCOPY N/A 06/27/2014   Procedure: ESOPHAGOGASTRODUODENOSCOPY (EGD);  Surgeon: Lafayette Dragon, MD;  Location: [facility];  Service: Endoscopy;  Laterality: N/A;   EYE SURGERY     catract surgery on both eyes   POLYPECTOMY     ROTATOR CUFF REPAIR     UPPER GASTROINTESTINAL ENDOSCOPY      Family History  Problem Relation Age of Onset   Diabetes Mother    Heart disease Mother    Hyperlipidemia Mother    Depression Mother    Diabetes Brother    Colon cancer Neg Hx    Colon polyps Neg Hx    Esophageal cancer Neg Hx    Rectal cancer Neg Hx    Stomach cancer Neg Hx     Social History:  reports that he has never smoked. He has never used smokeless tobacco. He reports current alcohol use. He reports current drug use. Drug: Methaqualone.  Review of Systems:  Last diabetic eye exam date: 2020 with Dr. Katy Fitch  Last foot exam date:7/22   Hypertension: Treated with HCTZ by his PCP  BP Readings from Last 3 Encounters:  01/07/22 124/78  12/02/21 117/75  10/01/21 128/84    Lipids: Well-controlled with rosuvastatin 20 mg daily    Lab Results  Component Value Date   CHOL 104 12/26/2021   CHOL 180 07/02/2020   CHOL 141 06/08/2019   Lab Results  Component Value Date   HDL 41.50 12/26/2021   HDL 52.50 07/02/2020   HDL 45.30 06/08/2019   Lab  Results  Component Value Date   LDLCALC 33 12/26/2021   LDLCALC 23 11/16/2017   LDLCALC 29 11/05/2016   Lab Results  Component Value Date   TRIG 150.0 (H) 12/26/2021   TRIG 317.0 (H) 07/02/2020   TRIG 282.0 (H) 06/08/2019   Lab Results  Component Value Date   CHOLHDL 3 12/26/2021   CHOLHDL 3 07/02/2020   CHOLHDL 3 06/08/2019   Lab Results  Component Value Date   LDLDIRECT 94.0 07/02/2020   LDLDIRECT 57.0 06/08/2019   LDLDIRECT 50.0 04/14/2018   NEUROPATHY: He has numbness in his feet and balance issues Does not see a foot doctor but is followed by neurologist His roommate cuts his toenails   Examination:   BP 124/78   Pulse 87   Ht 5' 8.5" (1.74 m)   Wt 211 lb 6.4 oz (95.9 kg)   SpO2 92%   BMI 31.68 kg/m   Body mass index is 31.68 kg/m.   Plantar surfaces of feet are normal to inspection with no calluses or skin breakdown Toenails are cut very short  ASSESSMENT/ PLAN:    Diabetes type 2:   Current regimen: Basal insulin, Ozempic and Farxiga  Blood glucose control is suboptimal with A1c 7.6  He likely has high postprandial readings which he does not monitor Also not clear if his fasting readings are controlled since he does not check in the morning May have some higher readings with foods like cereal in the morning or [place] food at restaurants  Also gaining weight this year  HYPERLIPIDEMIA: He has excellent control  with Crestor which he will continue  NEUROPATHY: He has sensory loss and difficulty balance, not very educated about foot care  Recommendations:  Start checking blood sugars regularly by rotation at different times including fasting or 2 hours after meals To bring monitor for review on this visit Switch Farxiga to the morning which may help his mealtime control better Discussed possibility of adding a mealtime insulin at least at dinnertime if he has consistently high readings after supper Consultation with dietitian to help improve his  diet and work on weight management Needs to schedule his eye exam  Given him detailed information on diabetic foot care and advised him to not have his toenails cut too close to the skin  Needs follow-up urine microalbumin  Patient Instructions  Check blood sugars on waking up 3 days a week  Also check blood sugars about 2 hours after meals and do this after different meals by rotation  Recommended blood sugar levels on waking up are 90-130 and about 2 hours after meal is 130-180  Please bring your blood sugar monitor to each visit, thank you  Get Eye exam  Take Farxiga in am     Elayne Snare 01/07/2022, 10:10 AM

## 2022-01-07 NOTE — Telephone Encounter (Signed)
Reviewed pt chart. Last Lyrica rx sent to this pharmacy 10/07/21 #180, 1 refill. Should have medication remaining, too early to send another prescription.   Checked drug registry. Last refilled Nucynta 12/07/21 #90.Last seen 12/02/21 and next f/u 06/30/22.

## 2022-01-09 ENCOUNTER — Other Ambulatory Visit: Payer: Self-pay | Admitting: Neurology

## 2022-01-10 ENCOUNTER — Other Ambulatory Visit: Payer: Self-pay | Admitting: Family Medicine

## 2022-01-10 NOTE — Telephone Encounter (Signed)
Pt is requesting a refill for tapentadol HCl (NUCYNTA) 75 MG tablet & pregabalin (LYRICA) 200 MG capsule .  Pharmacy: [facility] [phone]

## 2022-01-13 ENCOUNTER — Other Ambulatory Visit: Payer: Self-pay

## 2022-01-13 MED ORDER — PREGABALIN 200 MG PO CAPS
ORAL_CAPSULE | ORAL | 1 refills | Status: DC
Start: 2022-01-13 — End: 2022-05-29

## 2022-01-13 MED ORDER — TAPENTADOL HCL 75 MG PO TABS: ORAL_TABLET | ORAL | 0 refills | Status: DC

## 2022-01-13 NOTE — Addendum Note (Signed)
Addended by: Georgiann Cocker on: 01/13/2022 07:37 AM   Modules accepted: Orders

## 2022-01-13 NOTE — Telephone Encounter (Signed)
Last OV was on 12/02/21.  Next OV is scheduled for 06/30/22.  Last Nucynta RX was written on 12/07/21 for 90 tabs.  Last pregabalin RX was written on 10/07/21 for 180 tabs.   [place] Drug Database has been reviewed.

## 2022-01-13 NOTE — Progress Notes (Signed)
Last OV was on 12/02/21.  Next OV is scheduled for 06/30/22.  Last RX was written on 10/07/21 for 180 tabs.   [place] Drug Database has been reviewed.      *requested by pharmacy, previously sent to [facility]

## 2022-01-14 ENCOUNTER — Encounter: Payer: Self-pay | Admitting: Gastroenterology

## 2022-01-15 ENCOUNTER — Telehealth: Payer: Self-pay | Admitting: *Deleted

## 2022-01-15 NOTE — Telephone Encounter (Signed)
New instructions mailed to patient. 

## 2022-01-16 ENCOUNTER — Encounter: Payer: Medicare PPO | Admitting: Gastroenterology

## 2022-02-04 ENCOUNTER — Other Ambulatory Visit: Payer: Self-pay | Admitting: Neurology

## 2022-02-10 ENCOUNTER — Other Ambulatory Visit: Payer: Self-pay | Admitting: Family Medicine

## 2022-02-10 MED ORDER — TAPENTADOL HCL 75 MG PO TABS: ORAL_TABLET | ORAL | 0 refills | Status: DC

## 2022-02-10 NOTE — Telephone Encounter (Signed)
Pt is requesting a refill for tapentadol HCl (NUCYNTA) 75 MG tablet.  Pharmacy: [facility] [phone]

## 2022-02-13 ENCOUNTER — Encounter: Payer: Medicare PPO | Admitting: Gastroenterology

## 2022-03-03 ENCOUNTER — Encounter: Payer: Self-pay | Admitting: Internal Medicine

## 2022-03-03 ENCOUNTER — Ambulatory Visit: Payer: Medicare PPO | Admitting: Internal Medicine

## 2022-03-03 VITALS — BP 152/88 | HR 98 | Temp 98.4°F | Ht 68.5 in | Wt 212.0 lb

## 2022-03-03 DIAGNOSIS — E1140 Type 2 diabetes mellitus with diabetic neuropathy, unspecified: Secondary | ICD-10-CM | POA: Diagnosis not present

## 2022-03-03 DIAGNOSIS — Z125 Encounter for screening for malignant neoplasm of prostate: Secondary | ICD-10-CM

## 2022-03-03 DIAGNOSIS — I10 Essential (primary) hypertension: Secondary | ICD-10-CM | POA: Diagnosis not present

## 2022-03-03 DIAGNOSIS — E538 Deficiency of other specified B group vitamins: Secondary | ICD-10-CM | POA: Insufficient documentation

## 2022-03-03 DIAGNOSIS — E0840 Diabetes mellitus due to underlying condition with diabetic neuropathy, unspecified: Secondary | ICD-10-CM

## 2022-03-03 DIAGNOSIS — Z0001 Encounter for general adult medical examination with abnormal findings: Secondary | ICD-10-CM | POA: Diagnosis not present

## 2022-03-03 DIAGNOSIS — E559 Vitamin D deficiency, unspecified: Secondary | ICD-10-CM | POA: Diagnosis not present

## 2022-03-03 DIAGNOSIS — E7800 Pure hypercholesterolemia, unspecified: Secondary | ICD-10-CM

## 2022-03-03 LAB — VITAMIN D 25 HYDROXY (VIT D DEFICIENCY, FRACTURES): VITD: 18.3 ng/mL — ABNORMAL LOW (ref 30.00–100.00)

## 2022-03-03 LAB — CBC WITH DIFFERENTIAL/PLATELET
Basophils Absolute: 0 10*3/uL (ref 0.0–0.1)
Basophils Relative: 0.5 % (ref 0.0–3.0)
Eosinophils Absolute: 0.2 10*3/uL (ref 0.0–0.7)
Eosinophils Relative: 2.2 % (ref 0.0–5.0)
HCT: 48 % (ref 39.0–52.0)
Hemoglobin: 16.2 g/dL (ref 13.0–17.0)
Lymphocytes Relative: 34.8 % (ref 12.0–46.0)
Lymphs Abs: 3.2 10*3/uL (ref 0.7–4.0)
MCHC: 33.8 g/dL (ref 30.0–36.0)
MCV: 87.7 fl (ref 78.0–100.0)
Monocytes Absolute: 0.7 10*3/uL (ref 0.1–1.0)
Monocytes Relative: 7.9 % (ref 3.0–12.0)
Neutro Abs: 4.9 10*3/uL (ref 1.4–7.7)
Neutrophils Relative %: 54.6 % (ref 43.0–77.0)
Platelets: 189 10*3/uL (ref 150.0–400.0)
RBC: 5.47 Mil/uL (ref 4.22–5.81)
RDW: 14.8 % (ref 11.5–15.5)
WBC: 9.1 10*3/uL (ref 4.0–10.5)

## 2022-03-03 LAB — BASIC METABOLIC PANEL
BUN: 18 mg/dL (ref 6–23)
CO2: 30 mEq/L (ref 19–32)
Calcium: 9.7 mg/dL (ref 8.4–10.5)
Chloride: 101 mEq/L (ref 96–112)
Creatinine, Ser: 1.01 mg/dL (ref 0.40–1.50)
GFR: 76.39 mL/min (ref >60.00)
Glucose, Bld: 107 mg/dL — ABNORMAL HIGH (ref 70–99)
Potassium: 4 mEq/L (ref 3.5–5.1)
Sodium: 139 mEq/L (ref 135–145)

## 2022-03-03 LAB — HEPATIC FUNCTION PANEL
ALT: 16 U/L (ref 0–53)
AST: 14 U/L (ref 0–37)
Albumin: 4.5 g/dL (ref 3.5–5.2)
Alkaline Phosphatase: 63 U/L (ref 39–117)
Bilirubin, Direct: 0.1 mg/dL (ref 0.0–0.3)
Total Bilirubin: 0.8 mg/dL (ref 0.2–1.2)
Total Protein: 7.1 g/dL (ref 6.0–8.3)

## 2022-03-03 LAB — LIPID PANEL
Cholesterol: 221 mg/dL — ABNORMAL HIGH (ref 0–200)
HDL: 48.3 mg/dL (ref >39.00)
NonHDL: 173.1
Total CHOL/HDL Ratio: 5
Triglycerides: 245 mg/dL — ABNORMAL HIGH (ref 0.0–149.0)
VLDL: 49 mg/dL — ABNORMAL HIGH (ref 0.0–40.0)

## 2022-03-03 LAB — LDL CHOLESTEROL, DIRECT: Direct LDL: 130 mg/dL

## 2022-03-03 LAB — HEMOGLOBIN A1C: Hgb A1c MFr Bld: 6.4 % (ref 4.6–6.5)

## 2022-03-03 LAB — PSA: PSA: 0.93 ng/mL (ref 0.10–4.00)

## 2022-03-03 LAB — VITAMIN B12: Vitamin B-12: 156 pg/mL — ABNORMAL LOW (ref 211–911)

## 2022-03-03 LAB — TSH: TSH: 1.54 u[IU]/mL (ref 0.35–5.50)

## 2022-03-03 MED ORDER — HYDROCHLOROTHIAZIDE 12.5 MG PO CAPS: ORAL_CAPSULE | ORAL | 3 refills | Status: DC

## 2022-03-03 MED ORDER — LORAZEPAM 0.5 MG PO TABS: ORAL_TABLET | ORAL | 5 refills | Status: DC

## 2022-03-03 MED ORDER — ROSUVASTATIN CALCIUM 20 MG PO TABS
ORAL_TABLET | ORAL | 3 refills | Status: DC
Start: 2022-03-03 — End: 2023-02-24

## 2022-03-03 MED ORDER — METHYLPHENIDATE HCL ER (OSM) 36 MG PO TBCR: 36.0000 mg | EXTENDED_RELEASE_TABLET | Freq: Every day | ORAL | 0 refills | Status: DC

## 2022-03-03 NOTE — Assessment & Plan Note (Signed)
Last vitamin D Lab Results  Component Value Date   VD25OH 17.64 (L) 07/02/2020   Low, to start oral replacement

## 2022-03-03 NOTE — Patient Instructions (Signed)
Please continue all other medications as before, and refills have been done if requested.  Please have the pharmacy call with any other refills you may need.  Please continue your efforts at being more active, low cholesterol diet, and weight control.  You are otherwise up to date with prevention measures today.  Please keep your appointments with your specialists as you may have planned- Dr Dwyane Dee  Please go to the LAB at the blood drawing area for the tests to be done  You will be contacted by phone if any changes need to be made immediately.  Otherwise, you will receive a letter about your results with an explanation, but please check with MyChart first.  Please remember to sign up for MyChart if you have not done so, as this will be important to you in the future with finding out test results, communicating by private email, and scheduling acute appointments online when needed.  Please make an Appointment to return in 6 months, or sooner if needed, also with Lab Appointment for testing done 3-5 days before at the [facility] (so this is for TWO appointments - please see the scheduling desk as you leave)

## 2022-03-03 NOTE — Progress Notes (Unsigned)
Patient ID: Frank [name], male   DOB: 09-Sep-1955, 66 y.o.   MRN: [id number]         Chief Complaint:: wellness exam and htn, low vit d, low b12, htn, dm, hld       HPI:  Frank [name] is a 66 y.o. male here for wellness exam; pt plans to call to reschedule colonoscopy he missed due to illness; o/w up to date               Also not taking Vit d or B12.  Pt denies chest pain, increased sob or doe, wheezing, orthopnea, PND, increased LE swelling, palpitations, dizziness or syncope.   Pt denies polydipsia, polyuria, or new focal neuro s/s.    Pt denies fever, wt loss, night sweats, loss of appetite, or other constitutional symptoms  BP < 140/90 at home.     Wt Readings from Last 3 Encounters:  03/03/22 212 lb (96.2 kg)  01/07/22 211 lb 6.4 oz (95.9 kg)  12/26/21 220 lb (99.8 kg)   BP Readings from Last 3 Encounters:  03/03/22 (!) 152/88  01/07/22 124/78  12/02/21 117/75   Immunization History  Administered Date(s) Administered   Influenza Split 06/04/2012   Influenza Whole 05/20/2010   Influenza,inj,Quad PF,6+ Mos 05/05/2013, 05/17/2014   PFIZER(Purple Top)SARS-COV-2 Vaccination 11/21/2019, 12/19/2019, 07/17/2020, 05/24/2021   Pneumococcal Conjugate-13 05/31/2014   Pneumococcal Polysaccharide-23 05/20/2010   Td 08/11/2004   Health Maintenance Due  Topic Date Due   Diabetic kidney evaluation - Urine ACR  07/02/2021      Past Medical History:  Diagnosis Date   Abdominal pain, left lower quadrant 06/06/2010   Abdominal pain, unspecified site 01/19/2009   ADD 10/09/2008   ALLERGIC RHINITIS 10/09/2008   Allergy    ANXIETY DEPRESSION 02/01/2008   Arthritis    hands ?   Blood transfusion without reported diagnosis    Cataract    removed both eyes    COLONIC POLYPS 02/01/2008   DIABETES MELLITUS, TYPE II 05/20/2010   DYSPNEA 03/12/2010   ERECTILE DYSFUNCTION 10/09/2008   ERECTILE DYSFUNCTION, ORGANIC 05/20/2010   FOOT PAIN, LEFT 05/20/2010   GERD 02/01/2008   Guillain  Barr syndrome ([place])    HEMORRHOIDS 02/01/2008   HIATAL HERNIA 02/01/2008   HYPERLIPIDEMIA 10/09/2008   HYPERTENSION 10/09/2008   MORTON'S NEUROMA 05/20/2010   Other specified forms of hearing loss 06/27/2009   PERIPHERAL EDEMA 05/20/2010   PERIPHERAL NEUROPATHY 05/20/2010   Sleep apnea    stopped cpap    SLEEP APNEA, OBSTRUCTIVE 02/01/2008   Stricture and stenosis of esophagus 02/02/2008   Type II or unspecified type diabetes mellitus without mention of complication, uncontrolled 11/14/2010   WRIST PAIN, LEFT 12/05/2009   Past Surgical History:  Procedure Laterality Date   CARPAL TUNNEL RELEASE Bilateral    COLONOSCOPY     DENTAL SURGERY     ESOPHAGEAL DILATION  july 2009   ESOPHAGOGASTRODUODENOSCOPY N/A 06/27/2014   Procedure: ESOPHAGOGASTRODUODENOSCOPY (EGD);  Surgeon: Lafayette Dragon, MD;  Location: [facility];  Service: Endoscopy;  Laterality: N/A;   EYE SURGERY     catract surgery on both eyes   POLYPECTOMY     ROTATOR CUFF REPAIR     UPPER GASTROINTESTINAL ENDOSCOPY      reports that he has never smoked. He has never used smokeless tobacco. He reports current alcohol use. He reports current drug use. Drug: Methaqualone. family history includes Depression in his mother; Diabetes in his brother and mother; Heart disease in his  mother; Hyperlipidemia in his mother. Allergies  Allergen Reactions   Codeine Itching    REACTION: Itching   Influenza Vaccines     Due to hx of GBS   Metformin And Related Other (See Comments)    GI upset, diarrhea   Other Other (See Comments)    Due to hx of GBS- vaccines    Shellfish Allergy    Current Outpatient Medications on File Prior to Visit  Medication Sig Dispense Refill   ACCU-CHEK GUIDE test strip USE AS DIRECTED ONCE DAILY 100 strip 11   Accu-Chek Softclix Lancets lancets USE AS DIRECTED ONCE DAILY 100 each 11   ARIPiprazole (ABILIFY) 10 MG tablet Take 1 tablet (10 mg total) by mouth daily. 90 tablet 0   cyclobenzaprine (FLEXERIL) 5 MG  tablet TAKE 1 TABLET(5 MG) BY MOUTH AT BEDTIME 30 tablet 11   dapagliflozin propanediol (FARXIGA) 10 MG TABS tablet Take 1 tablet (10 mg total) by mouth daily before breakfast. 90 tablet 3   DULoxetine (CYMBALTA) 60 MG capsule TAKE ONE CAPSULE TWICE DAILY 180 capsule 3   insulin degludec (TRESIBA FLEXTOUCH) 200 UNIT/ML FlexTouch Pen Inject 74 Units into the skin daily. 42 mL 3   Insulin Pen Needle (B-D ULTRAFINE III SHORT PEN) 31G X 8 MM MISC USE AS DIRECTED EVERY DAY WITH TRESIBA PENS 100 each 2   omeprazole (PRILOSEC OTC) 20 MG tablet Take 20 mg by mouth daily.     pregabalin (LYRICA) 200 MG capsule TAKE (1) CAPSULE TWO TIMES DAILY. 180 capsule 1   Semaglutide, 2 MG/DOSE, (OZEMPIC, 2 MG/DOSE,) 8 MG/3ML SOPN Inject 2 mg into the skin once a week. 9 mL 3   tapentadol HCl (NUCYNTA) 75 MG tablet TAKE ONE TABLET BY MOUTH THREE TIMES DAILY AS NEEDED 90 tablet 0   No current facility-administered medications on file prior to visit.        ROS:  All others reviewed and negative.  Objective        PE:  BP (!) 152/88 (BP Location: Left Arm, Patient Position: Sitting, Cuff Size: Large)   Pulse 98   Temp 98.4 F (36.9 C) (Oral)   Ht 5' 8.5" (1.74 m)   Wt 212 lb (96.2 kg)   SpO2 96%   BMI 31.77 kg/m                 Constitutional: Pt appears in NAD               HENT: Head: NCAT.                Right Ear: External ear normal.                 Left Ear: External ear normal.                Eyes: . Pupils are equal, round, and reactive to light. Conjunctivae and EOM are normal               Nose: without d/c or deformity               Neck: Neck supple. Gross normal ROM               Cardiovascular: Normal rate and regular rhythm.                 Pulmonary/Chest: Effort normal and breath sounds without rales or wheezing.  Abd:  Soft, NT, ND, + BS, no organomegaly               Neurological: Pt is alert. At baseline orientation, motor grossly intact               Skin: Skin is  warm. No rashes, no other new lesions, LE edema - none               Psychiatric: Pt behavior is normal without agitation   Micro: none  Cardiac tracings I have personally interpreted today:  none  Pertinent Radiological findings (summarize): none   Lab Results  Component Value Date   WBC 9.1 03/03/2022   HGB 16.2 03/03/2022   HCT 48.0 03/03/2022   PLT 189.0 03/03/2022   GLUCOSE 107 (H) 03/03/2022   CHOL 221 (H) 03/03/2022   TRIG 245.0 (H) 03/03/2022   HDL 48.30 03/03/2022   LDLDIRECT 130.0 03/03/2022   LDLCALC 33 12/26/2021   ALT 16 03/03/2022   AST 14 03/03/2022   NA 139 03/03/2022   K 4.0 03/03/2022   CL 101 03/03/2022   CREATININE 1.01 03/03/2022   BUN 18 03/03/2022   CO2 30 03/03/2022   TSH 1.54 03/03/2022   PSA 0.93 03/03/2022   HGBA1C 6.4 03/03/2022   MICROALBUR 1.6 07/02/2020   Assessment/Plan:  Afnan Emberton is a 66 y.o. White or Caucasian [1] male with  has a past medical history of Abdominal pain, left lower quadrant (06/06/2010), Abdominal pain, unspecified site (01/19/2009), ADD (10/09/2008), ALLERGIC RHINITIS (10/09/2008), Allergy, ANXIETY DEPRESSION (02/01/2008), Arthritis, Blood transfusion without reported diagnosis, Cataract, COLONIC POLYPS (02/01/2008), DIABETES MELLITUS, TYPE II (05/20/2010), DYSPNEA (03/12/2010), ERECTILE DYSFUNCTION (10/09/2008), ERECTILE DYSFUNCTION, ORGANIC (05/20/2010), FOOT PAIN, LEFT (05/20/2010), GERD (02/01/2008), Guillain Barr syndrome ([place]), HEMORRHOIDS (02/01/2008), HIATAL HERNIA (02/01/2008), HYPERLIPIDEMIA (10/09/2008), HYPERTENSION (10/09/2008), MORTON'S NEUROMA (05/20/2010), Other specified forms of hearing loss (06/27/2009), PERIPHERAL EDEMA (05/20/2010), PERIPHERAL NEUROPATHY (05/20/2010), Sleep apnea, SLEEP APNEA, OBSTRUCTIVE (02/01/2008), Stricture and stenosis of esophagus (02/02/2008), Type II or unspecified type diabetes mellitus without mention of complication, uncontrolled (11/14/2010), and WRIST PAIN, LEFT (12/05/2009).  Vitamin D  deficiency Last vitamin D Lab Results  Component Value Date   VD25OH 17.64 (L) 07/02/2020   Low, to start oral replacement   Encounter for well adult exam with abnormal findings Age and sex appropriate education and counseling updated with regular exercise and diet Referrals for preventative services - pt will call to reschedule colonoscopy Immunizations addressed - none needed Smoking counseling  - none needed Evidence for depression or other mood disorder - none significant Most recent labs reviewed. I have personally reviewed and have noted: 1) the patient's medical and social history 2) The patient's current medications and supplements 3) The patient's height, weight, and BMI have been recorded in the chart   Hyperlipidemia Lab Results  Component Value Date   LDLCALC 33 12/26/2021   Stable, pt to continue current statin crestor 20 mg qd   Essential hypertension BP Readings from Last 3 Encounters:  03/03/22 (!) 152/88  01/07/22 124/78  12/02/21 117/75   Uncontrolled today, but stable at home per pt, pt to continue medical treatment hct 12.5 qd   Diabetes mellitus due to underlying condition with diabetic neuropathy [facility]) Lab Results  Component Value Date   HGBA1C 6.4 03/03/2022   Stable, pt to continue current medical treatment tresiba and f/u endo as planned   B12 deficiency Lab Results  Component Value Date   VITAMINB12 156 (L) 03/03/2022   Low to start  oral replacement - b12 1000 mcg qd  Followup: Return in about 6 months (around 09/03/2022).  Cathlean Cower, MD 03/04/2022 9:51 PM [facility]

## 2022-03-04 ENCOUNTER — Encounter: Payer: Self-pay | Admitting: Internal Medicine

## 2022-03-04 NOTE — Assessment & Plan Note (Signed)
Lab Results  Component Value Date   HGBA1C 6.4 03/03/2022   Stable, pt to continue current medical treatment tresiba and f/u endo as planned

## 2022-03-04 NOTE — Assessment & Plan Note (Signed)
Lab Results  Component Value Date   LDLCALC 33 12/26/2021   Stable, pt to continue current statin crestor 20 mg qd

## 2022-03-04 NOTE — Assessment & Plan Note (Signed)
Lab Results  Component Value Date   VITAMINB12 156 (L) 03/03/2022   Low to start oral replacement - b12 1000 mcg qd

## 2022-03-04 NOTE — Assessment & Plan Note (Signed)
Age and sex appropriate education and counseling updated with regular exercise and diet Referrals for preventative services - pt will call to reschedule colonoscopy Immunizations addressed - none needed Smoking counseling  - none needed Evidence for depression or other mood disorder - none significant Most recent labs reviewed. I have personally reviewed and have noted: 1) the patient's medical and social history 2) The patient's current medications and supplements 3) The patient's height, weight, and BMI have been recorded in the chart

## 2022-03-04 NOTE — Assessment & Plan Note (Signed)
BP Readings from Last 3 Encounters:  03/03/22 (!) 152/88  01/07/22 124/78  12/02/21 117/75   Uncontrolled today, but stable at home per pt, pt to continue medical treatment hct 12.5 qd

## 2022-03-05 ENCOUNTER — Encounter: Payer: Self-pay | Admitting: Internal Medicine

## 2022-03-10 ENCOUNTER — Other Ambulatory Visit: Payer: Self-pay | Admitting: Family Medicine

## 2022-03-10 ENCOUNTER — Telehealth: Payer: Self-pay

## 2022-03-10 MED ORDER — TAPENTADOL HCL 75 MG PO TABS: ORAL_TABLET | ORAL | 0 refills | Status: DC

## 2022-03-10 MED ORDER — ACCU-CHEK GUIDE VI STRP: ORAL_STRIP | 11 refills | Status: DC

## 2022-03-10 MED ORDER — ACCU-CHEK SOFTCLIX LANCETS MISC: 11 refills | Status: DC

## 2022-03-10 NOTE — Telephone Encounter (Signed)
Pt request refill for tapentadol HCl (NUCYNTA) 75 MG tablet at [facility]

## 2022-03-10 NOTE — Telephone Encounter (Signed)
Pt is up to date sent refills to [facility].Marland KitchenJohny Chess

## 2022-03-10 NOTE — Telephone Encounter (Signed)
Pt requesting a new Rx for: ACCU-CHEK GUIDE test strip  Pharmacy: [id number] - [place], [place] staff states that the Rx was close so the remainder of the refills couldn't be used so a New Rx is needed.  LOV 03/03/22 ROV 09/08/22 Please advise

## 2022-03-11 ENCOUNTER — Other Ambulatory Visit: Payer: Self-pay

## 2022-03-11 DIAGNOSIS — E0840 Diabetes mellitus due to underlying condition with diabetic neuropathy, unspecified: Secondary | ICD-10-CM

## 2022-03-11 MED ORDER — TRESIBA FLEXTOUCH 200 UNIT/ML ~~LOC~~ SOPN: 74.0000 [IU] | PEN_INJECTOR | Freq: Every day | SUBCUTANEOUS | 3 refills | Status: DC

## 2022-03-14 ENCOUNTER — Encounter: Payer: Self-pay | Admitting: Gastroenterology

## 2022-03-18 ENCOUNTER — Other Ambulatory Visit: Payer: Self-pay | Admitting: Neurology

## 2022-03-26 ENCOUNTER — Encounter: Payer: Medicare PPO | Attending: Endocrinology | Admitting: Nutrition

## 2022-03-26 DIAGNOSIS — E1142 Type 2 diabetes mellitus with diabetic polyneuropathy: Secondary | ICD-10-CM | POA: Insufficient documentation

## 2022-03-26 NOTE — Progress Notes (Signed)
Patient is here with his husband to review his diet and blood sugar readings. He is using a meter and does not like to check his blood sugars.  He tests randomly without reference to meal time,  and readings vary from 156-267.   Exercise:  none   Diet:  Typical day: 6-7 AM:  takes [place]  74u and goes back to sleep.  Did not take it this AM.  No reason why he did not do this.  He reports that he takes this every day without fail.   11AM up.  Bfast:  Captain Crunch cereal 1 1/2 cup with 2% milk.  Will drink all of the milk when done                             Or cottage cheese with fruit  Coke 0 to drink                             Or 2 eggs, 4 [place] bacon pieces, 1 toast with jelly. 1-2PM: Pkg of [place] with coke 0 2-4PM: lunch:  varies grately with amounts of carbs-anywhere from 15-75 grams.                           [employer] of broccoli with cheese and turger, or hamburger, or hot dog with mac and cheese                         Leftovers from last night's supper 5-6PM:  "large bowl of [place] ice cream 7:30-8PM: Supper:  4 pieces of pizza, or ribs with mac and cheese, with green beans or peas, or mash potatoes. 9:30PM: bed 11:30:  Up and eats "something sweet" like "little debbie"  donuts, or whatever he can find.  1AM:  back to bed.    * Pt reports being happy with weight.  No desire to loose weight. Pt. was given a Dexcom G7 sensor with reader. ( He can not use phone due to numbness of hands from neuropathy. ) I am hoping that he can see what snacking and high fat food are doing to his blood sugar, as well sweetened cereal that he eats several mornings/wk. Discussion:  Need to stop cold cereal and milk.  Other options suggested like toast with peanut butter and small amount of jelly, or cheese toast.  Suggested that this will provide protein to prevent muscle loss and hunger in 2-3 hours after eating.  They agreed that this would be a better option.  List of other breakfast choices  given. Need to monitor blood sugar readings to determine effects of meals eaten.  Goals for readings:  ac: less than 120,and 2hr. Pc: less than 170.   Suggested they return in 4 weeks to review blood sugar readings and diet again.  Am hoping that by viewing the blood sugar rises after eating "sweets "during the night and evening, he may curtain them, or reduce amounts eaten. Note to contact patient in 2 weeks to review his blood sugar readings with him, and suggest dietary changes.

## 2022-04-02 ENCOUNTER — Ambulatory Visit (AMBULATORY_SURGERY_CENTER): Payer: Medicare PPO

## 2022-04-02 ENCOUNTER — Telehealth: Payer: Self-pay

## 2022-04-02 VITALS — Ht 68.5 in | Wt 212.0 lb

## 2022-04-02 DIAGNOSIS — Z86010 Personal history of colonic polyps: Secondary | ICD-10-CM

## 2022-04-02 MED ORDER — PLENVU 140 G PO SOLR: 1.0000 | Freq: Once | ORAL | 0 refills | Status: AC

## 2022-04-02 NOTE — Telephone Encounter (Signed)
Spoke with pt's husband, [name], and advised him to notify pt's oral surgeon of colonoscopy scheduled 04/29/22 as he reports dental implant is planned for 04/07/22.

## 2022-04-02 NOTE — Progress Notes (Signed)
No egg or soy allergy known to patient  No issues known to pt with past sedation with any surgeries or procedures Patient denies ever being told they had issues or difficulty with intubation  No FH of Malignant Hyperthermia Pt is not on diet pills Pt is not on  home 02  Pt is not on blood thinners  Pt reports issues with constipation  No A fib or A flutter Have any cardiac testing pending--denied Pt instructed to use [email or web address] or GoodRx for a price reduction on prep   PV via phone, speaking with pt and his husband (pt hard of hearing). Instructions reviewed and mailed to verified address [Plenvu with Miralax].

## 2022-04-05 NOTE — Patient Instructions (Signed)
Wear dexcom for 10 days.  If you like this, call office for prescription. Call Dexcom help line if sensor falls off, or If questions. Stop cold cereal and milk, and decrease amount of snacking during the night less than 150 calories.

## 2022-04-10 ENCOUNTER — Other Ambulatory Visit: Payer: Self-pay | Admitting: Family Medicine

## 2022-04-10 MED ORDER — TAPENTADOL HCL 75 MG PO TABS: ORAL_TABLET | ORAL | 0 refills | Status: DC

## 2022-04-10 NOTE — Addendum Note (Signed)
Addended by: Wyvonnia Lora on: 04/10/2022 09:42 AM   Modules accepted: Orders

## 2022-04-10 NOTE — Telephone Encounter (Signed)
Pt is requesting a refill for apentadol HCl (NUCYNTA) 75 MG tablet.  Pharmacy: [phone]

## 2022-04-17 ENCOUNTER — Encounter: Payer: Self-pay | Admitting: Gastroenterology

## 2022-04-23 ENCOUNTER — Other Ambulatory Visit: Payer: Self-pay

## 2022-04-23 MED ORDER — DEXCOM G7 SENSOR MISC: 2 refills | Status: DC

## 2022-04-27 ENCOUNTER — Encounter: Payer: Self-pay | Admitting: Certified Registered Nurse Anesthetist

## 2022-04-29 ENCOUNTER — Encounter: Payer: Self-pay | Admitting: Gastroenterology

## 2022-04-29 ENCOUNTER — Other Ambulatory Visit: Payer: Self-pay

## 2022-04-29 ENCOUNTER — Other Ambulatory Visit: Payer: Self-pay | Admitting: Endocrinology

## 2022-04-29 ENCOUNTER — Ambulatory Visit (AMBULATORY_SURGERY_CENTER): Payer: Medicare PPO | Admitting: Gastroenterology

## 2022-04-29 VITALS — BP 117/75 | HR 76 | Temp 98.1°F | Resp 8 | Ht 68.5 in | Wt 212.0 lb

## 2022-04-29 DIAGNOSIS — Z09 Encounter for follow-up examination after completed treatment for conditions other than malignant neoplasm: Secondary | ICD-10-CM | POA: Diagnosis not present

## 2022-04-29 DIAGNOSIS — E1165 Type 2 diabetes mellitus with hyperglycemia: Secondary | ICD-10-CM

## 2022-04-29 DIAGNOSIS — D122 Benign neoplasm of ascending colon: Secondary | ICD-10-CM | POA: Diagnosis not present

## 2022-04-29 DIAGNOSIS — Z86010 Personal history of colonic polyps: Secondary | ICD-10-CM

## 2022-04-29 DIAGNOSIS — E119 Type 2 diabetes mellitus without complications: Secondary | ICD-10-CM | POA: Diagnosis not present

## 2022-04-29 DIAGNOSIS — G4733 Obstructive sleep apnea (adult) (pediatric): Secondary | ICD-10-CM | POA: Diagnosis not present

## 2022-04-29 MED ORDER — DEXCOM G7 SENSOR MISC: 2 refills | Status: DC

## 2022-04-29 MED ORDER — DEXTROSE 5 % IV SOLN: Freq: Once | INTRAVENOUS | Status: AC

## 2022-04-29 MED ORDER — DEXTROSE 50 % IV SOLN: 25.0000 mL | Freq: Once | INTRAVENOUS | Status: AC

## 2022-04-29 MED ORDER — SODIUM CHLORIDE 0.9 % IV SOLN: 500.0000 mL | Freq: Once | INTRAVENOUS | Status: DC

## 2022-04-29 NOTE — Progress Notes (Signed)
Called to room to assist during endoscopic procedure.  Patient ID and intended procedure confirmed with present staff. Received instructions for my participation in the procedure from the performing physician.  

## 2022-04-29 NOTE — Progress Notes (Signed)
Pt's states no medical or surgical changes since previsit or office visit.   CBG 53 on admit. Pt c/o weakness and more shaky than normal. Per protocol started IV with D5W at [facility]. Also gave 62m D50 IV Push. Dr. DLoletha Carrowaware.   Repeat CBG is 90

## 2022-04-29 NOTE — Op Note (Signed)
[place] Patient Name: Frank [name] Procedure Date: 04/29/2022 9:01 AM MRN: [id number] Endoscopist: Mallie Mussel L. Loletha Carrow , MD Age: 66 Referring MD:  Date of Birth: 1955/11/03 Gender: Male Account #: [id number] Procedure:                Colonoscopy Indications:              Surveillance: History of numerous adenomas on last                            colonoscopy (< 3 yrs)                           TA x 9 - Jan 2021 Medicines:                Monitored Anesthesia Care Procedure:                Pre-Anesthesia Assessment:                           - Prior to the procedure, a History and Physical                            was performed, and patient medications and                            allergies were reviewed. The patient's tolerance of                            previous anesthesia was also reviewed. The risks                            and benefits of the procedure and the sedation                            options and risks were discussed with the patient.                            All questions were answered, and informed consent                            was obtained. Prior Anticoagulants: The patient has                            taken no previous anticoagulant or antiplatelet                            agents. ASA Grade Assessment: II - A patient with                            mild systemic disease. After reviewing the risks                            and benefits, the patient was deemed in  satisfactory condition to undergo the procedure.                           After obtaining informed consent, the colonoscope                            was passed under direct vision. Throughout the                            procedure, the patient's blood pressure, pulse, and                            oxygen saturations were monitored continuously. The                            Olympus CF-HQ190L (Serial# [id number]) Colonoscope was                             introduced through the anus and advanced to the the                            cecum, identified by appendiceal orifice and                            ileocecal valve. The colonoscopy was somewhat                            difficult due to a redundant colon and significant                            looping. Successful completion of the procedure was                            aided by using manual pressure and straightening                            and shortening the scope to obtain bowel loop                            reduction. The patient tolerated the procedure                            well. The quality of the bowel preparation was                            excellent. The ileocecal valve, appendiceal                            orifice, and rectum were photographed. The bowel                            preparation used was 2 day Plenvu/Miralax. Scope In: 9:30:09 AM Scope Out: 9:52:26 AM Scope Withdrawal Time: 0 hours 15 minutes 29 seconds  Total Procedure  Duration: 0 hours 22 minutes 17 seconds  Findings:                 The perianal and digital rectal examinations were                            normal.                           Four sessile polyps were found in the ascending                            colon. The polyps were 3 to 6 mm in size. These                            polyps were removed with a cold snare. Resection                            and retrieval were complete.                           There were two lipomas in the ascending colon.                           Repeat examination of right colon under NBI                            performed.                           The exam was otherwise without abnormality on                            direct and retroflexion views. Complications:            No immediate complications. Estimated Blood Loss:     Estimated blood loss was minimal. Impression:               - Four 3 to 6 mm polyps in the ascending colon,                             removed with a cold snare. Resected and retrieved.                           - Lipoma in the ascending colon.                           - The examination was otherwise normal on direct                            and retroflexion views. Recommendation:           - Patient has a contact number available for                            emergencies. The signs and symptoms of potential  delayed complications were discussed with the                            patient. Return to normal activities tomorrow.                            Written discharge instructions were provided to the                            patient.                           - Resume previous diet.                           - Continue present medications.                           - Await pathology results.                           - Repeat colonoscopy in 3 years for surveillance. Lannette Avellino L. Loletha Carrow, MD 04/29/2022 9:58:32 AM This report has been signed electronically.

## 2022-04-29 NOTE — Patient Instructions (Signed)
Thank you for coming in to see [place] today! Resume previous diet and medications today. Return to normal daily activities tomorrow. Recommend surveillance colonoscopy in 3 years.  We will send a reminder.  YOU HAD AN ENDOSCOPIC PROCEDURE TODAY AT [facility]:   Refer to the procedure report that was given to you for any specific questions about what was found during the examination.  If the procedure report does not answer your questions, please call your gastroenterologist to clarify.  If you requested that your care partner not be given the details of your procedure findings, then the procedure report has been included in a sealed envelope for you to review at your convenience later.  YOU SHOULD EXPECT: Some feelings of bloating in the abdomen. Passage of more gas than usual.  Walking can help get rid of the air that was put into your GI tract during the procedure and reduce the bloating. If you had a lower endoscopy (such as a colonoscopy or flexible sigmoidoscopy) you may notice spotting of blood in your stool or on the toilet paper. If you underwent a bowel prep for your procedure, you may not have a normal bowel movement for a few days.  Please Note:  You might notice some irritation and congestion in your nose or some drainage.  This is from the oxygen used during your procedure.  There is no need for concern and it should clear up in a day or so.  SYMPTOMS TO REPORT IMMEDIATELY:  Following lower endoscopy (colonoscopy or flexible sigmoidoscopy):  Excessive amounts of blood in the stool  Significant tenderness or worsening of abdominal pains  Swelling of the abdomen that is new, acute  Fever of 100F or higher    For urgent or emergent issues, a gastroenterologist can be reached at any hour by calling [phone]. Do not use MyChart messaging for urgent concerns.    DIET:  We do recommend a small meal at first, but then you may proceed to your regular diet.  Drink  plenty of fluids but you should avoid alcoholic beverages for 24 hours.  ACTIVITY:  You should plan to take it easy for the rest of today and you should NOT DRIVE or use heavy machinery until tomorrow (because of the sedation medicines used during the test).    FOLLOW UP: Our staff will call the number listed on your records the next business day following your procedure.  We will call around 7:15- 8:00 am to check on you and address any questions or concerns that you may have regarding the information given to you following your procedure. If we do not reach you, we will leave a message.     If any biopsies were taken you will be contacted by phone or by letter within the next 1-3 weeks.  Please call us at [phone] if you have not heard about the biopsies in 3 weeks.    SIGNATURES/CONFIDENTIALITY: You and/or your care partner have signed paperwork which will be entered into your electronic medical record.  These signatures attest to the fact that that the information above on your After Visit Summary has been reviewed and is understood.  Full responsibility of the confidentiality of this discharge information lies with you and/or your care-partner.

## 2022-04-29 NOTE — Progress Notes (Signed)
History and Physical:  This patient presents for endoscopic testing for: Encounter Diagnosis  Name Primary?   Personal history of colonic polyps Yes    9 TA polyps last colonoscopy Jan 2021 Patient denies chronic abdominal pain, rectal bleeding, constipation or diarrhea. FSBS 33 this am - fatigued but o/w asymptomatic.  One half amp D50 given, D%W and NS fluids running.  Repeat FSBS before procedure 90.  Patient is otherwise without complaints or active issues today.   Past Medical History: Past Medical History:  Diagnosis Date   Abdominal pain, left lower quadrant 06/06/2010   Abdominal pain, unspecified site 01/19/2009   ADD 10/09/2008   ALLERGIC RHINITIS 10/09/2008   Allergy    ANXIETY DEPRESSION 02/01/2008   Arthritis    hands ?   Blood transfusion without reported diagnosis    Cataract    removed both eyes    COLONIC POLYPS 02/01/2008   DIABETES MELLITUS, TYPE II 05/20/2010   DYSPNEA 03/12/2010   ERECTILE DYSFUNCTION 10/09/2008   ERECTILE DYSFUNCTION, ORGANIC 05/20/2010   FOOT PAIN, LEFT 05/20/2010   GERD 02/01/2008   Guillain Barr syndrome ([place])    HEMORRHOIDS 02/01/2008   HIATAL HERNIA 02/01/2008   HYPERLIPIDEMIA 10/09/2008   HYPERTENSION 10/09/2008   MORTON'S NEUROMA 05/20/2010   Other specified forms of hearing loss 06/27/2009   PERIPHERAL EDEMA 05/20/2010   PERIPHERAL NEUROPATHY 05/20/2010   Sleep apnea    stopped cpap    SLEEP APNEA, OBSTRUCTIVE 02/01/2008   Stricture and stenosis of esophagus 02/02/2008   Type II or unspecified type diabetes mellitus without mention of complication, uncontrolled 11/14/2010   WRIST PAIN, LEFT 12/05/2009     Past Surgical History: Past Surgical History:  Procedure Laterality Date   CARPAL TUNNEL RELEASE Bilateral    COLONOSCOPY     DENTAL SURGERY     ESOPHAGEAL DILATION  july 2009   ESOPHAGOGASTRODUODENOSCOPY N/A 06/27/2014   Procedure: ESOPHAGOGASTRODUODENOSCOPY (EGD);  Surgeon: Lafayette Dragon, MD;  Location: [facility];  Service:  Endoscopy;  Laterality: N/A;   EYE SURGERY     catract surgery on both eyes   POLYPECTOMY     ROTATOR CUFF REPAIR     UPPER GASTROINTESTINAL ENDOSCOPY      Allergies: Allergies  Allergen Reactions   Codeine Itching    REACTION: Itching   Influenza Vaccines     Due to hx of GBS   Metformin And Related Other (See Comments)    GI upset, diarrhea   Other Other (See Comments)    Due to hx of GBS- vaccines    Shellfish Allergy     Outpatient Meds: Current Outpatient Medications  Medication Sig Dispense Refill   Accu-Chek Softclix Lancets lancets Use to check blood sugars daily 100 each 11   ARIPiprazole (ABILIFY) 10 MG tablet Take 1 tablet (10 mg total) by mouth daily. 90 tablet 0   Continuous Blood Gluc Sensor (DEXCOM G7 SENSOR) MISC Change every 10 days 3 each 2   cyclobenzaprine (FLEXERIL) 5 MG tablet Take 1 tablet (5 mg total) by mouth at bedtime. 30 tablet 5   dapagliflozin propanediol (FARXIGA) 10 MG TABS tablet Take 1 tablet (10 mg total) by mouth daily before breakfast. 90 tablet 3   DULoxetine (CYMBALTA) 60 MG capsule TAKE ONE CAPSULE TWICE DAILY 180 capsule 3   glucose blood (ACCU-CHEK GUIDE) test strip Use to check blood sugars daily 100 strip 11   hydrochlorothiazide (MICROZIDE) 12.5 MG capsule TAKE 1 CAPSULE(12.5 MG) BY MOUTH DAILY 90 capsule 3  insulin degludec (TRESIBA FLEXTOUCH) 200 UNIT/ML FlexTouch Pen Inject 74 Units into the skin daily. 42 mL 3   Insulin Pen Needle (B-D ULTRAFINE III SHORT PEN) 31G X 8 MM MISC USE AS DIRECTED EVERY DAY WITH TRESIBA PENS 100 each 2   LORazepam (ATIVAN) 0.5 MG tablet TAKE 1 TABLET BY MOUTH TWICE DAILY AS NEEDED 60 tablet 5   omeprazole (PRILOSEC OTC) 20 MG tablet Take 20 mg by mouth daily.     pregabalin (LYRICA) 200 MG capsule TAKE (1) CAPSULE TWO TIMES DAILY. 180 capsule 1   rosuvastatin (CRESTOR) 20 MG tablet 1 tab by mouth once daily 90 tablet 3   tapentadol HCl (NUCYNTA) 75 MG tablet TAKE ONE TABLET BY MOUTH THREE TIMES DAILY  AS NEEDED 90 tablet 0   methylphenidate (CONCERTA) 36 MG PO CR tablet Take 1 tablet (36 mg total) by mouth daily. (Patient not taking: Reported on 04/29/2022) 30 tablet 0   Semaglutide, 2 MG/DOSE, (OZEMPIC, 2 MG/DOSE,) 8 MG/3ML SOPN Inject 2 mg into the skin once a week. 9 mL 3   Current Facility-Administered Medications  Medication Dose Route Frequency Provider Last Rate Last Admin   0.9 %  sodium chloride infusion  500 mL Intravenous Once Danis, Estill Cotta III, MD       dextrose 5 % solution   Intravenous Once Danis, Estill Cotta III, MD       dextrose 50 % solution 25 mL  25 mL Intravenous Once Doran Stabler, MD          ___________________________________________________________________ Objective   Exam:  BP (!) 149/84   Pulse 94   Temp 98.1 F (36.7 C) (Temporal)   Ht 5' 8.5" (1.74 m)   Wt 212 lb (96.2 kg)   SpO2 97%   BMI 31.77 kg/m   CV: RRR without murmur, S1/S2 Resp: clear to auscultation bilaterally, normal RR and effort noted GI: soft, no tenderness, with active bowel sounds.   Assessment: Encounter Diagnosis  Name Primary?   Personal history of colonic polyps Yes     Plan: Colonoscopy  The benefits and risks of the planned procedure were described in detail with the patient or (when appropriate) their health care proxy.  Risks were outlined as including, but not limited to, bleeding, infection, perforation, adverse medication reaction leading to cardiac or pulmonary decompensation, pancreatitis (if ERCP).  The limitation of incomplete mucosal visualization was also discussed.  No guarantees or warranties were given.    The patient is appropriate for an endoscopic procedure in the ambulatory setting.   - Wilfrid Lund, MD

## 2022-04-29 NOTE — Progress Notes (Signed)
Report given to PACU, vss 

## 2022-04-30 ENCOUNTER — Telehealth: Payer: Self-pay | Admitting: *Deleted

## 2022-04-30 NOTE — Telephone Encounter (Signed)
  Follow up Call-     04/29/2022    8:54 AM 08/24/2019    7:49 AM  Call back number  Post procedure Call Back phone  # [phone]  Permission to leave phone message Yes Yes    Spoke with pt's spouse, Frank [name], who states pt is sleeping this morning but he ate a good dinner last night and is doing well as far as he knows. Instructed to call back if there are any issues.   Patient questions:  Do you have a fever, pain , or abdominal swelling? No. Pain Score  0 *  Have you tolerated food without any problems? Yes.    Have you been able to return to your normal activities? Yes.    Do you have any questions about your discharge instructions: Diet   No. Medications  No. Follow up visit  No.  Do you have questions or concerns about your Care? No.  Actions: * If pain score is 4 or above: No action needed, pain <4.

## 2022-05-01 ENCOUNTER — Other Ambulatory Visit: Payer: Self-pay

## 2022-05-01 ENCOUNTER — Other Ambulatory Visit (HOSPITAL_COMMUNITY): Payer: Self-pay

## 2022-05-01 DIAGNOSIS — E1165 Type 2 diabetes mellitus with hyperglycemia: Secondary | ICD-10-CM

## 2022-05-01 MED ORDER — BD PEN NEEDLE SHORT U/F 31G X 8 MM MISC: 2 refills | Status: DC

## 2022-05-05 ENCOUNTER — Telehealth: Payer: Self-pay

## 2022-05-05 ENCOUNTER — Other Ambulatory Visit (HOSPITAL_COMMUNITY): Payer: Self-pay

## 2022-05-05 DIAGNOSIS — E1165 Type 2 diabetes mellitus with hyperglycemia: Secondary | ICD-10-CM

## 2022-05-05 NOTE — Telephone Encounter (Signed)
Patient Advocate Encounter   Received notification that prior authorization is required for [facility] G7 Sensor  Submitted: 05-06-2023 Key  [id number]  Status is pending

## 2022-05-05 NOTE — Telephone Encounter (Signed)
PA submitted. Will update when receive determination. Key: [id number]

## 2022-05-06 ENCOUNTER — Encounter: Payer: Self-pay | Admitting: Gastroenterology

## 2022-05-08 ENCOUNTER — Other Ambulatory Visit (HOSPITAL_COMMUNITY): Payer: Self-pay

## 2022-05-08 MED ORDER — DEXCOM G7 SENSOR MISC: 2 refills | Status: DC

## 2022-05-08 NOTE — Telephone Encounter (Signed)
Patient Advocate Encounter  Test billing indicates that Dexcom products are not covered under this patients prescription plan. Must be submitted to Medicare Part B.  [facility] / ICD code on the prescription.  Will have office send new RX. PA Not needed at this time.  Clista Bernhardt, CPhT Rx Patient Advocate Phone: [phone]

## 2022-05-09 ENCOUNTER — Other Ambulatory Visit (INDEPENDENT_AMBULATORY_CARE_PROVIDER_SITE_OTHER): Payer: Medicare PPO

## 2022-05-09 ENCOUNTER — Telehealth: Payer: Self-pay | Admitting: Endocrinology

## 2022-05-09 DIAGNOSIS — E1165 Type 2 diabetes mellitus with hyperglycemia: Secondary | ICD-10-CM | POA: Diagnosis not present

## 2022-05-09 DIAGNOSIS — Z794 Long term (current) use of insulin: Secondary | ICD-10-CM

## 2022-05-09 LAB — HEMOGLOBIN A1C: Hgb A1c MFr Bld: 6.2 % (ref 4.6–6.5)

## 2022-05-09 LAB — MICROALBUMIN / CREATININE URINE RATIO
Creatinine,U: 73.9 mg/dL
Microalb Creat Ratio: 0.9 mg/g (ref 0.0–30.0)
Microalb, Ur: <0.7 mg/dL (ref 0.0–1.9)

## 2022-05-09 LAB — BASIC METABOLIC PANEL
BUN: 23 mg/dL (ref 6–23)
CO2: 31 mEq/L (ref 19–32)
Calcium: 9.9 mg/dL (ref 8.4–10.5)
Chloride: 99 mEq/L (ref 96–112)
Creatinine, Ser: 1.24 mg/dL (ref 0.40–1.50)
GFR: 59.64 mL/min — ABNORMAL LOW (ref >60.00)
Glucose, Bld: 160 mg/dL — ABNORMAL HIGH (ref 70–99)
Potassium: 3.8 mEq/L (ref 3.5–5.1)
Sodium: 138 mEq/L (ref 135–145)

## 2022-05-09 NOTE — Telephone Encounter (Signed)
error 

## 2022-05-12 ENCOUNTER — Other Ambulatory Visit: Payer: Self-pay | Admitting: Family Medicine

## 2022-05-12 MED ORDER — TAPENTADOL HCL 75 MG PO TABS: ORAL_TABLET | ORAL | 0 refills | Status: DC

## 2022-05-12 NOTE — Telephone Encounter (Signed)
Pt called. Requesting a refill on tapentadol HCl (NUCYNTA) 75 MG tablet. Requesting refill be sent to [id number]  .

## 2022-05-12 NOTE — Telephone Encounter (Signed)
I have routed this request to Dr Sater for review. The pt is due for the medication and [place] registry was verified.  

## 2022-05-13 ENCOUNTER — Ambulatory Visit: Payer: Medicare PPO | Admitting: Endocrinology

## 2022-05-13 ENCOUNTER — Encounter: Payer: Self-pay | Admitting: Endocrinology

## 2022-05-13 VITALS — BP 144/66 | HR 107 | Ht 69.0 in | Wt 211.2 lb

## 2022-05-13 DIAGNOSIS — E1142 Type 2 diabetes mellitus with diabetic polyneuropathy: Secondary | ICD-10-CM | POA: Diagnosis not present

## 2022-05-13 DIAGNOSIS — Z794 Long term (current) use of insulin: Secondary | ICD-10-CM

## 2022-05-13 DIAGNOSIS — E1165 Type 2 diabetes mellitus with hyperglycemia: Secondary | ICD-10-CM | POA: Diagnosis not present

## 2022-05-13 MED ORDER — DEXCOM G7 SENSOR MISC: 2 refills | Status: AC

## 2022-05-13 NOTE — Progress Notes (Unsigned)
Patient ID: Frank [name], male   DOB: January 17, 1956, 66 y.o.   MRN: [id number]           Reason for Appointment: Type II Diabetes follow-up   History of Present Illness   Diagnosis date: 2012  Previous history:  His A1c has ranged between 7 and 10.8 since 2018 Farxiga was started in 2022, was on Ozempic since 7/22, previously he took Trulicity for a year  Recent history:     Non-insulin hypoglycemic drugs: Farxiga 10 mg daily, Ozempic 2 mg weekly     Insulin regimen: Tresiba 74 units daily           Side effects from medications: Diarrhea from metformin ER  Current self management, blood sugar patterns and problems identified:  A1c is 8.2 compared to 7.6 He appears to have much better blood sugars after being able to start the Dexcom sensor, last visit was in 5/23  Likely has improved his diet especially being aware of postprandial readings; previously may have had higher readings with foods like cereal and [place] food He has no changes insulin With his Dexcom monitoring his blood sugars are excellent at 91% within target range  Only occasionally appears to have relatively high readings after midnight possibly from snacks or relatively higher fat meals He has a couple of low sugars randomly but unclear why this is occurring without any mealtime insulin  He was told to take his [place] in the mornings on the last visit  Today his partner is present and is able to understand his management and discussion about his medications and insulin  Exercise: none   Diet management: Sometimes will have special K cereal in the morning otherwise skipping breakfast, usually avoiding fast food and sweet drinks  Analysis of the Dexcom sensor download for the last 2 weeks as follows  His blood sugars are generally very stable throughout the day HYPERGLYCEMIC episodes are seen in week #2 between 12 AM-4 AM and periodically after one of the meals; hyperglycemia in 1 week #1 is also  infrequent 10 at the same times Usually postprandial readings are not rising significantly after meals compared to Premeal readings on an average and only may go up as above Hypoglycemia occurring rarely and week #1 at 2 PM or 10 PM, generally transiently Overnight blood sugars overall fairly stable except as above with no hypoglycemia His average blood sugar at any given time is between about 111 and 141, lowest reading before dinner   CGM use % of time   2-week average/GV 125/27  Time in range        91%  % Time Above 180 8  % Time above 250   % Time Below 70 1              CDE visit: Most recent: 8/23, nutritionist: 2019  Weight history:  Wt Readings from Last 3 Encounters:  05/13/22 211 lb 3.2 oz (95.8 kg)  04/29/22 212 lb (96.2 kg)  04/02/22 212 lb (96.2 kg)            Diabetes labs:  Lab Results  Component Value Date   HGBA1C 6.2 05/09/2022   HGBA1C 6.4 03/03/2022   HGBA1C 7.6 (H) 12/26/2021   Lab Results  Component Value Date   MICROALBUR <0.7 05/09/2022   LDLCALC 33 12/26/2021   CREATININE 1.24 05/09/2022     Allergies as of 05/13/2022       Reactions   Codeine Itching   REACTION: Itching  Influenza Vaccines    Due to hx of GBS   Metformin And Related Other (See Comments)   GI upset, diarrhea   Other Other (See Comments)   Due to hx of GBS- vaccines    Shellfish Allergy         Medication List        Accurate as of May 13, 2022 11:36 AM. If you have any questions, ask your nurse or doctor.          Accu-Chek Guide test strip Generic drug: glucose blood Use to check blood sugars daily   Accu-Chek Softclix Lancets lancets Use to check blood sugars daily   ARIPiprazole 10 MG tablet Commonly known as: ABILIFY Take 1 tablet (10 mg total) by mouth daily.   B-D ULTRAFINE III SHORT PEN 31G X 8 MM Misc Generic drug: Insulin Pen Needle USE AS DIRECTED EVERY DAY WITH TRESIBA PENS   cyclobenzaprine 5 MG tablet Commonly known as:  FLEXERIL Take 1 tablet (5 mg total) by mouth at bedtime.   dapagliflozin propanediol 10 MG Tabs tablet Commonly known as: Farxiga Take 1 tablet (10 mg total) by mouth daily before breakfast.   Dexcom G7 Sensor Misc Change every 10 days   DULoxetine 60 MG capsule Commonly known as: CYMBALTA TAKE ONE CAPSULE TWICE DAILY   hydrochlorothiazide 12.5 MG capsule Commonly known as: MICROZIDE TAKE 1 CAPSULE(12.5 MG) BY MOUTH DAILY   LORazepam 0.5 MG tablet Commonly known as: ATIVAN TAKE 1 TABLET BY MOUTH TWICE DAILY AS NEEDED   methylphenidate 36 MG CR tablet Commonly known as: Concerta Take 1 tablet (36 mg total) by mouth daily.   omeprazole 20 MG tablet Commonly known as: PRILOSEC OTC Take 20 mg by mouth daily.   Ozempic (2 MG/DOSE) 8 MG/3ML Sopn Generic drug: Semaglutide (2 MG/DOSE) Inject 2 mg into the skin once a week.   pregabalin 200 MG capsule Commonly known as: Lyrica TAKE (1) CAPSULE TWO TIMES DAILY.   rosuvastatin 20 MG tablet Commonly known as: CRESTOR 1 tab by mouth once daily   tapentadol HCl 75 MG tablet Commonly known as: Nucynta TAKE ONE TABLET BY MOUTH THREE TIMES DAILY AS NEEDED   Tresiba FlexTouch 200 UNIT/ML FlexTouch Pen Generic drug: insulin degludec Inject 74 Units into the skin daily.        Allergies:  Allergies  Allergen Reactions   Codeine Itching    REACTION: Itching   Influenza Vaccines     Due to hx of GBS   Metformin And Related Other (See Comments)    GI upset, diarrhea   Other Other (See Comments)    Due to hx of GBS- vaccines    Shellfish Allergy     Past Medical History:  Diagnosis Date   Abdominal pain, left lower quadrant 06/06/2010   Abdominal pain, unspecified site 01/19/2009   ADD 10/09/2008   ALLERGIC RHINITIS 10/09/2008   Allergy    ANXIETY DEPRESSION 02/01/2008   Arthritis    hands ?   Blood transfusion without reported diagnosis    Cataract    removed both eyes    COLONIC POLYPS 02/01/2008   DIABETES  MELLITUS, TYPE II 05/20/2010   DYSPNEA 03/12/2010   ERECTILE DYSFUNCTION 10/09/2008   ERECTILE DYSFUNCTION, ORGANIC 05/20/2010   FOOT PAIN, LEFT 05/20/2010   GERD 02/01/2008   Guillain Barr syndrome ([place])    HEMORRHOIDS 02/01/2008   HIATAL HERNIA 02/01/2008   HYPERLIPIDEMIA 10/09/2008   HYPERTENSION 10/09/2008   MORTON'S NEUROMA 05/20/2010   Other  specified forms of hearing loss 06/27/2009   PERIPHERAL EDEMA 05/20/2010   PERIPHERAL NEUROPATHY 05/20/2010   Sleep apnea    stopped cpap    SLEEP APNEA, OBSTRUCTIVE 02/01/2008   Stricture and stenosis of esophagus 02/02/2008   Type II or unspecified type diabetes mellitus without mention of complication, uncontrolled 11/14/2010   WRIST PAIN, LEFT 12/05/2009    Past Surgical History:  Procedure Laterality Date   CARPAL TUNNEL RELEASE Bilateral    COLONOSCOPY     DENTAL SURGERY     ESOPHAGEAL DILATION  july 2009   ESOPHAGOGASTRODUODENOSCOPY N/A 06/27/2014   Procedure: ESOPHAGOGASTRODUODENOSCOPY (EGD);  Surgeon: Lafayette Dragon, MD;  Location: [facility];  Service: Endoscopy;  Laterality: N/A;   EYE SURGERY     catract surgery on both eyes   POLYPECTOMY     ROTATOR CUFF REPAIR     UPPER GASTROINTESTINAL ENDOSCOPY      Family History  Problem Relation Age of Onset   Diabetes Mother    Heart disease Mother    Hyperlipidemia Mother    Depression Mother    Diabetes Brother    Colon cancer Neg Hx    Colon polyps Neg Hx    Esophageal cancer Neg Hx    Rectal cancer Neg Hx    Stomach cancer Neg Hx     Social History:  reports that he has never smoked. He has never used smokeless tobacco. He reports current alcohol use. He reports current drug use. Drug: Methaqualone.  Review of Systems:  Last diabetic eye exam date: 2020 with Dr. Katy Fitch  Last foot exam date:7/22  Hypertension: Treated with HCTZ by his PCP  BP Readings from Last 3 Encounters:  05/13/22 (!) 144/66  04/29/22 117/75  03/03/22 (!) 152/88    Lipids: Well-controlled with  rosuvastatin 20 mg daily    Lab Results  Component Value Date   CHOL 221 (H) 03/03/2022   CHOL 104 12/26/2021   CHOL 180 07/02/2020   Lab Results  Component Value Date   HDL 48.30 03/03/2022   HDL 41.50 12/26/2021   HDL 52.50 07/02/2020   Lab Results  Component Value Date   LDLCALC 33 12/26/2021   LDLCALC 23 11/16/2017   LDLCALC 29 11/05/2016   Lab Results  Component Value Date   TRIG 245.0 (H) 03/03/2022   TRIG 150.0 (H) 12/26/2021   TRIG 317.0 (H) 07/02/2020   Lab Results  Component Value Date   CHOLHDL 5 03/03/2022   CHOLHDL 3 12/26/2021   CHOLHDL 3 07/02/2020   Lab Results  Component Value Date   LDLDIRECT 130.0 03/03/2022   LDLDIRECT 94.0 07/02/2020   LDLDIRECT 57.0 06/08/2019   NEUROPATHY: He has numbness in his feet and balance issues Does not see a foot doctor but is followed by neurologist His roommate cuts his toenails   Examination:   BP (!) 144/66   Pulse (!) 107   Ht '5\' 9"'$  (1.753 m)   Wt 211 lb 3.2 oz (95.8 kg)   SpO2 94%   BMI 31.19 kg/m   Body mass index is 31.19 kg/m.     ASSESSMENT/ PLAN:    Diabetes type 2:   Current regimen: Basal insulin 74 units, Ozempic and Farxiga  Blood glucose control is much better with A1c now 6.2 with his A1c 7.6  He likely has improved his diet overall with using the sensor compared to only random blood sugars fasting in the past Recent sensor download shows 91% in target range and no consistently  abnormal patterns Weight has leveled off Current blood sugar patterns and adjustment of insulin and diet were discussed  NEUROPATHY: He has sensory loss and difficulty balance and no new symptoms  Recommendations:  Trial of 70 units of Tresiba instead of 74 and may cut down another 10 units if morning sugars start getting below 100 Consistent diet again as above and watch blood sugars after meals Needs to schedule his eye exam Continue current dose of Ozempic, does not need any mealtime insulin; however  if he has difficulty getting the 2 mg prescription filled he can try 1 mg temporarily  Microalbumin normal  Total visit time including evaluation and management and counseling = 30 minutes  Patient Instructions  70 units not 74  If am sugar stays <100 go down to [place] 05/13/2022, 11:36 AM

## 2022-05-13 NOTE — Patient Instructions (Addendum)
70 units not 74  If am sugar stays <100 go down to 64  Get eye exam  Check blood sugars on waking up days a week  Also check blood sugars about 2 hours after meals and do this after different meals by rotation  Recommended blood sugar levels on waking up are 90-120 and about 2 hours after meal is 130-160  Please bring your blood sugar monitor to each visit, thank you

## 2022-05-14 ENCOUNTER — Other Ambulatory Visit: Payer: Self-pay | Admitting: Endocrinology

## 2022-05-14 DIAGNOSIS — E1165 Type 2 diabetes mellitus with hyperglycemia: Secondary | ICD-10-CM

## 2022-05-16 ENCOUNTER — Encounter: Payer: Self-pay | Admitting: Endocrinology

## 2022-05-29 ENCOUNTER — Other Ambulatory Visit: Payer: Self-pay

## 2022-05-29 DIAGNOSIS — E1165 Type 2 diabetes mellitus with hyperglycemia: Secondary | ICD-10-CM

## 2022-05-29 MED ORDER — ACCU-CHEK GUIDE VI STRP: ORAL_STRIP | 11 refills | Status: AC

## 2022-05-29 MED ORDER — ACCU-CHEK SOFTCLIX LANCETS MISC: 11 refills | Status: AC

## 2022-05-29 MED ORDER — DAPAGLIFLOZIN PROPANEDIOL 10 MG PO TABS: 10.0000 mg | ORAL_TABLET | Freq: Every day | ORAL | 3 refills | Status: DC

## 2022-05-29 MED ORDER — PREGABALIN 200 MG PO CAPS: ORAL_CAPSULE | ORAL | 1 refills | Status: DC

## 2022-05-29 MED ORDER — DULOXETINE HCL 60 MG PO CPEP: 60.0000 mg | ORAL_CAPSULE | Freq: Two times a day (BID) | ORAL | 3 refills | Status: DC

## 2022-05-29 MED ORDER — BD PEN NEEDLE SHORT U/F 31G X 8 MM MISC: 2 refills | Status: DC

## 2022-05-29 MED ORDER — ARIPIPRAZOLE 10 MG PO TABS: 10.0000 mg | ORAL_TABLET | Freq: Every day | ORAL | 0 refills | Status: DC

## 2022-05-29 MED ORDER — CYCLOBENZAPRINE HCL 5 MG PO TABS: 5.0000 mg | ORAL_TABLET | Freq: Every day | ORAL | 5 refills | Status: DC

## 2022-06-09 ENCOUNTER — Other Ambulatory Visit: Payer: Self-pay | Admitting: Family Medicine

## 2022-06-09 MED ORDER — TAPENTADOL HCL 75 MG PO TABS: ORAL_TABLET | ORAL | 0 refills | Status: DC

## 2022-06-09 NOTE — Telephone Encounter (Signed)
Pt is requesting a refill for apentadol HCl (NUCYNTA) 75 MG tablet .  [facility] [address]

## 2022-06-16 ENCOUNTER — Telehealth: Payer: Self-pay

## 2022-06-16 NOTE — Telephone Encounter (Signed)
Patient called in to see if office notes have been faxed to [facility] about 5 times. Refaxed again today.

## 2022-06-25 DIAGNOSIS — E1165 Type 2 diabetes mellitus with hyperglycemia: Secondary | ICD-10-CM | POA: Diagnosis not present

## 2022-06-30 ENCOUNTER — Encounter: Payer: Self-pay | Admitting: Neurology

## 2022-06-30 ENCOUNTER — Ambulatory Visit: Payer: Medicare PPO | Admitting: Neurology

## 2022-06-30 VITALS — BP 128/75 | HR 97 | Ht 69.0 in | Wt 211.6 lb

## 2022-06-30 DIAGNOSIS — E1142 Type 2 diabetes mellitus with diabetic polyneuropathy: Secondary | ICD-10-CM | POA: Diagnosis not present

## 2022-06-30 DIAGNOSIS — G894 Chronic pain syndrome: Secondary | ICD-10-CM | POA: Diagnosis not present

## 2022-06-30 DIAGNOSIS — G4733 Obstructive sleep apnea (adult) (pediatric): Secondary | ICD-10-CM

## 2022-06-30 MED ORDER — TAPENTADOL HCL 50 MG PO TABS: 50.0000 mg | ORAL_TABLET | Freq: Three times a day (TID) | ORAL | 0 refills | Status: DC

## 2022-06-30 NOTE — Progress Notes (Signed)
[facility]  PATIENT: Frank [name] DOB: 10-Dec-1955  _________________________________   HISTORICAL  CHIEF COMPLAINT:  Chief Complaint  Patient presents with   Follow-up    RM 2, alone. Doing well, no new sx.    HISTORY OF PRESENT ILLNESS:  Frank [name] is a 66 year old man with a chronic peripheral neuropathy who had superimposed Guillain Barr syndrome in November 2015 and has had severe neuropathic pain  Update 06/30/2022: He feels he pain is doing about the same as at the last visit.   Pain is up to the knees in the legs and also in fingers.  Quality is burning. Pain is mostly from diabetic polyneuropathy.  He also has carpal tunnel syndrome.  He also feels his muscles are stiff, .   He is on Nucynta 75 mg tid, Lyrica 200 mg po bid and Cymbalta 60 mg po bid.  Nucynta sometimes causes mild sleepiness.   His PCP prescribes lorazepam 0.5 mg bid.   He is on methylphenidate ER 36 qAM   The NCCS database shows good compliance.       He has a lot of numbness in his feet.   No sores.    No falls.   He needs to hold something if eyes are closed  He is sleeping well.   He has OSA (severe 69.3) but was unable to tolerate CPAP. He has no major weight change since last visit.   He wakes up a couple times a night due to nocturia but quickly falls back asleep..  We discussed he has severe OSA but did not tolerate CPAP and is not interested in surgery or oral appliance.   He is on Ozempic but has not lost any weight    EPWORTH SLEEPINESS SCALE 03/11/2021  On a scale of 0 - 3 what is the chance of dozing:  Sitting and Reading:   1 Watching TV:    2 Sitting inactive in a public place: 2 Passenger in car for one hour: 2 Lying down to rest in the afternoon: 3 Sitting and talking to someone: 0 Sitting quietly after lunch:  3 In a car, stopped in traffic:  0    Total (out of 24):   13  /24 mild ESS   B12 was low 2021 but he never supplemented.     GBS History:    The initial episode of GBS was treated with plasmapheresis. He reports that the diagnosis took a couple weeks and he had several emergency room visits. Initially he was sent to a skilled nursing facility. He was reporting a lot of back pain alongside the weakness. A lumbar puncture was eventually done showing high protein which led to the dye diagnosis of GBS.  He had severe weakness at the peak and required intubation for respiratory support. At that time, he was unable to move his legs and could barely move his arms. He improved quite a bit after plasmapheresis and was discharged. However, a couple days after discharge he began to feel weak again and return to the emergency room. He was recently admitted for recurrent GBS. For that hospital stay he did not require intubation but did have a facemask oxygen. He also was treated with IVIG the second time around. He improved and was discharged home. He did outpatient physical therapy. He has had a fairly good in movement of his strength and is able to walk independently. However, he has had more dysesthetic pain.   With the exception of more  pain, he otherwise feels very close to his pre-GBS baseline.   Of note, he had a flu shot a few weeks before the first episode of GBS.  MRI's from 06/2014 and 07/2014:    Cervical MRI shows spinal stenosis at C4-C5, C5-C6 and C6-C7. There is no spinal cord compression. Thoracic spine shows T8-T9 disc extrusion to the left that does not cause spinal cord compression. There are mild degenerative changes in the lumbar spine. Contrast was not used.   I also reviewed many of the notes and labs from his hospital stay. CSF Protein was greatly elevated at 118 and the CSF white blood cell count was 1.    REVIEW OF SYSTEMS: Constitutional: No fevers, chills, sweats, or change in appetite.   Has sleepiness Eyes: No visual changes, double vision, eye pain Ear, nose and throat: No hearing loss, ear pain, nasal congestion, sore  throat Cardiovascular: No chest pain, palpitations Respiratory:  No shortness of breath at rest or with exertion.   No wheezes.  He has OSA GastrointestinaI: No nausea, vomiting, diarrhea, abdominal pain, fecal incontinence Genitourinary:  No dysuria, urinary retention or frequency.  No nocturia. Musculoskeletal:  No neck pain, back pain Integumentary: No rash, pruritus, skin lesions Neurological: as above Psychiatric: Anxiety and depression at this time.   ADD is stable. Endocrine: No palpitations, diaphoresis, change in appetite, change in weigh or increased thirst Hematologic/Lymphatic:  No anemia, purpura, petechiae. Allergic/Immunologic: No itchy/runny eyes, nasal congestion, recent allergic reactions, rashes  ALLERGIES: Allergies  Allergen Reactions   Codeine Itching    REACTION: Itching   Influenza Vaccines     Due to hx of GBS   Metformin And Related Other (See Comments)    GI upset, diarrhea   Other Other (See Comments)    Due to hx of GBS- vaccines    Shellfish Allergy     HOME MEDICATIONS:  Current Outpatient Medications:    Accu-Chek Softclix Lancets lancets, Use to check blood sugars daily, Disp: 100 each, Rfl: 11   ARIPiprazole (ABILIFY) 10 MG tablet, Take 1 tablet (10 mg total) by mouth daily., Disp: 90 tablet, Rfl: 0   Continuous Blood Gluc Sensor (DEXCOM G7 SENSOR) MISC, Change every 10 days, Disp: 3 each, Rfl: 2   cyclobenzaprine (FLEXERIL) 5 MG tablet, Take 1 tablet (5 mg total) by mouth at bedtime., Disp: 30 tablet, Rfl: 5   dapagliflozin propanediol (FARXIGA) 10 MG TABS tablet, Take 1 tablet (10 mg total) by mouth daily before breakfast., Disp: 90 tablet, Rfl: 3   DULoxetine (CYMBALTA) 60 MG capsule, Take 1 capsule (60 mg total) by mouth 2 (two) times daily., Disp: 180 capsule, Rfl: 3   glucose blood (ACCU-CHEK GUIDE) test strip, Use to check blood sugars daily, Disp: 100 strip, Rfl: 11   hydrochlorothiazide (MICROZIDE) 12.5 MG capsule, TAKE 1 CAPSULE(12.5  MG) BY MOUTH DAILY, Disp: 90 capsule, Rfl: 3   insulin degludec (TRESIBA FLEXTOUCH) 200 UNIT/ML FlexTouch Pen, Inject 74 Units into the skin daily., Disp: 42 mL, Rfl: 3   Insulin Pen Needle (B-D ULTRAFINE III SHORT PEN) 31G X 8 MM MISC, USE AS DIRECTED EVERY DAY WITH TRESIBA PENS, Disp: 100 each, Rfl: 2   LORazepam (ATIVAN) 0.5 MG tablet, TAKE 1 TABLET BY MOUTH TWICE DAILY AS NEEDED, Disp: 60 tablet, Rfl: 5   methylphenidate (CONCERTA) 36 MG PO CR tablet, Take 1 tablet (36 mg total) by mouth daily., Disp: 30 tablet, Rfl: 0   omeprazole (PRILOSEC OTC) 20 MG tablet, Take 20 mg  by mouth daily., Disp: , Rfl:    [START ON 07/06/2022] pregabalin (LYRICA) 200 MG capsule, TAKE (1) CAPSULE TWO TIMES DAILY., Disp: 180 capsule, Rfl: 1   rosuvastatin (CRESTOR) 20 MG tablet, 1 tab by mouth once daily, Disp: 90 tablet, Rfl: 3   Semaglutide, 2 MG/DOSE, (OZEMPIC, 2 MG/DOSE,) 8 MG/3ML SOPN, Inject 2 mg into the skin once a week., Disp: 9 mL, Rfl: 3   tapentadol (NUCYNTA) 50 MG tablet, Take 1 tablet (50 mg total) by mouth 3 (three) times daily., Disp: 90 tablet, Rfl: 0  Current Facility-Administered Medications:    dextrose 5 % solution, , Intravenous, Once, Danis, Estill Cotta III, MD   dextrose 50 % solution 25 mL, 25 mL, Intravenous, Once, Danis, Kirke Corin, MD  PAST MEDICAL HISTORY: Past Medical History:  Diagnosis Date   Abdominal pain, left lower quadrant 06/06/2010   Abdominal pain, unspecified site 01/19/2009   ADD 10/09/2008   ALLERGIC RHINITIS 10/09/2008   Allergy    ANXIETY DEPRESSION 02/01/2008   Arthritis    hands ?   Blood transfusion without reported diagnosis    Cataract    removed both eyes    COLONIC POLYPS 02/01/2008   DIABETES MELLITUS, TYPE II 05/20/2010   DYSPNEA 03/12/2010   ERECTILE DYSFUNCTION 10/09/2008   ERECTILE DYSFUNCTION, ORGANIC 05/20/2010   FOOT PAIN, LEFT 05/20/2010   GERD 02/01/2008   Guillain Barr syndrome ([place])    HEMORRHOIDS 02/01/2008   HIATAL HERNIA 02/01/2008    HYPERLIPIDEMIA 10/09/2008   HYPERTENSION 10/09/2008   MORTON'S NEUROMA 05/20/2010   Other specified forms of hearing loss 06/27/2009   PERIPHERAL EDEMA 05/20/2010   PERIPHERAL NEUROPATHY 05/20/2010   Sleep apnea    stopped cpap    SLEEP APNEA, OBSTRUCTIVE 02/01/2008   Stricture and stenosis of esophagus 02/02/2008   Type II or unspecified type diabetes mellitus without mention of complication, uncontrolled 11/14/2010   WRIST PAIN, LEFT 12/05/2009    PAST SURGICAL HISTORY: Past Surgical History:  Procedure Laterality Date   CARPAL TUNNEL RELEASE Bilateral    COLONOSCOPY     DENTAL SURGERY     ESOPHAGEAL DILATION  july 2009   ESOPHAGOGASTRODUODENOSCOPY N/A 06/27/2014   Procedure: ESOPHAGOGASTRODUODENOSCOPY (EGD);  Surgeon: Lafayette Dragon, MD;  Location: [facility];  Service: Endoscopy;  Laterality: N/A;   EYE SURGERY     catract surgery on both eyes   POLYPECTOMY     ROTATOR CUFF REPAIR     UPPER GASTROINTESTINAL ENDOSCOPY      FAMILY HISTORY: Family History  Problem Relation Age of Onset   Diabetes Mother    Heart disease Mother    Hyperlipidemia Mother    Depression Mother    Diabetes Brother    Colon cancer Neg Hx    Colon polyps Neg Hx    Esophageal cancer Neg Hx    Rectal cancer Neg Hx    Stomach cancer Neg Hx     SOCIAL HISTORY:  Social History   Socioeconomic History   Marital status: Single    Spouse name: Not on file   Number of children: Not on file   Years of education: Not on file   Highest education level: Not on file  Occupational History   Occupation: Housekeeper [employer]    Employer: [employer]  Tobacco Use   Smoking status: Never   Smokeless tobacco: Never  Vaping Use   Vaping Use: Never used  Substance and Sexual Activity   Alcohol use: Yes    Comment:  occasional   Drug use: Yes    Types: Methaqualone   Sexual activity: Yes    Comment: before I got sick  Other Topics Concern   Not on file  Social History Narrative   Not on file    Social Determinants of Health   Financial Resource Strain: Not on file  Food Insecurity: Not on file  Transportation Needs: Not on file  Physical Activity: Not on file  Stress: Not on file  Social Connections: Not on file  Intimate Partner Violence: Not on file     PHYSICAL EXAM  Vitals:   06/30/22 1050  BP: 128/75  Pulse: 97  Weight: 211 lb 9.6 oz (96 kg)  Height: '5\' 9"'$  (1.753 m)    Body mass index is 31.25 kg/m.   General: The patient is well-developed and well-nourished and in no acute distress  Skin: Extremities are without rash. He has mild pedal edema, slightly worse on the left.    HEENT:   His pharynx is Mallampatti 3.  Neurologic Exam  Mental status: The patient is alert and oriented x 3 at the time of the examination. The patient has apparent normal recent and remote memory, with an apparently normal attention span and concentration ability.   Speech is normal.  Cranial nerves: Extraocular movements are full. Facial strength and sensation are normal..  Trapezius and sternocleidomastoid strength is normal. No dysarthria is noted.   . No obvious hearing deficits are noted.  Motor:  Muscle bulk is normal. Muscle tone is normal. Strength is 5/5 proximally in the arms and legs.  Strength was 4+/5 in the intrinsic hand muscles and the EHL and intrinsic foot muscles.  Sensory: Sensory testing is intact at the wrist but reduced vib (normal touch) at fingertips.   There is mild reduced vibration sensation at the knees, but is moderately severe loss at the ankles (25%) and absent at the toes.   Touch/pinprick is reduced  mid shin and very reduced in the feet.  Coordination: He has good finger-nose-finger but reduced heel-to-shin bilaterally.  Gait and station: Station is normal.   The gait has a reduced stride.   The tandem gait is poor and wide.. Romberg is positive.   Reflexes: Deep tendon reflexes are symmetric  ric and 1+ bilaterally at deltoid and trace at  knees, absent at the ankles.    DIAGNOSTIC DATA (LABS, IMAGING, TESTING) - I reviewed patient records, labs, notes, testing and imaging myself where available.  Lab Results  Component Value Date   WBC 9.1 03/03/2022   HGB 16.2 03/03/2022   HCT 48.0 03/03/2022   MCV 87.7 03/03/2022   PLT 189.0 03/03/2022      Component Value Date/Time   NA 138 05/09/2022 1132   K 3.8 05/09/2022 1132   CL 99 05/09/2022 1132   CO2 31 05/09/2022 1132   GLUCOSE 160 (H) 05/09/2022 1132   BUN 23 05/09/2022 1132   CREATININE 1.24 05/09/2022 1132   CALCIUM 9.9 05/09/2022 1132   PROT 7.1 03/03/2022 1203   PROT 7.0 08/30/2015 1157   ALBUMIN 4.5 03/03/2022 1203   AST 14 03/03/2022 1203   ALT 16 03/03/2022 1203   ALKPHOS 63 03/03/2022 1203   BILITOT 0.8 03/03/2022 1203   GFRNONAA >90 08/06/2014 0240   GFRAA >90 08/06/2014 0240   Lab Results  Component Value Date   CHOL 221 (H) 03/03/2022   HDL 48.30 03/03/2022   LDLCALC 33 12/26/2021   LDLDIRECT 130.0 03/03/2022   TRIG 245.0 (  H) 03/03/2022   CHOLHDL 5 03/03/2022   Lab Results  Component Value Date   HGBA1C 6.2 05/09/2022   Lab Results  Component Value Date   VITAMINB12 156 (L) 03/03/2022   Lab Results  Component Value Date   TSH 1.54 03/03/2022       ASSESSMENT AND PLAN  Diabetic polyneuropathy associated with type 2 diabetes mellitus (HCC)  Chronic pain syndrome  Obstructive sleep apnea  1.  Continue Lyrica and Nucynta and Cymbalta for neuropathic pain.   Because of untreated OSA and age, I am going to reduce the Nucynta to 50 mg po tid to increase safety.     PDMP reviewed and he is compliant.    Will check UDS today.    2.   He has severe OSA and could not tolerate CPAP.   Again today, we discussed options including surgery, Inspire and oral appliance.  He is on Ozempic but has not lost weight.  We discussed also not to take any more than the prescribed amount of medication as they may add to the sleep apnea to suppress  breathing more  3.    Stay active and exercise as tolerated. 4.    B12 was low in 2021.  Advised to take 1000 mcg daily   Return in 4 months or sooner if there are new or worsening neurologic symptoms.  Bladimir Auman A. [name], MD, PhD 34/35/6861, 68:37 AM Certified in Neurology, Clinical Neurophysiology, Sleep Medicine, Pain Medicine and Neuroimaging  [facility] [address] [place], [place] [address] [phone]

## 2022-07-01 LAB — DRUG SCREEN, URINE
Amphetamines, Urine: NEGATIVE ng/mL
Barbiturate screen, urine: NEGATIVE ng/mL
Benzodiazepine Quant, Ur: NEGATIVE ng/mL
Cannabinoid Quant, Ur: NEGATIVE ng/mL
Cocaine (Metab.): NEGATIVE ng/mL
Opiate Quant, Ur: NEGATIVE ng/mL
PCP Quant, Ur: NEGATIVE ng/mL

## 2022-07-14 ENCOUNTER — Telehealth: Payer: Self-pay | Admitting: Neurology

## 2022-07-14 NOTE — Telephone Encounter (Signed)
Pt is requesting a refill for apentadol HCl (NUCYNTA) 75 MG tablet .  Pharmacy: [id number]

## 2022-07-14 NOTE — Telephone Encounter (Signed)
Pt last seen 06/30/22 and next f/u 12/08/22.  Per drug registry, last refilled Nucynta 06/09/22 #90.  LVM for pt to remind him refill already sent in by Dr. [name] on 06/30/22. Should be at pharmacy ready for pick up.

## 2022-08-19 ENCOUNTER — Other Ambulatory Visit: Payer: Self-pay | Admitting: Neurology

## 2022-08-19 MED ORDER — TAPENTADOL HCL 50 MG PO TABS: 50.0000 mg | ORAL_TABLET | Freq: Three times a day (TID) | ORAL | 0 refills | Status: DC

## 2022-08-19 NOTE — Telephone Encounter (Signed)
Patient is due for a refill on nucynta. Patient is up to date on his appointments. [place] Controlled Substance Registry checked and shows that patient picked up the nucynta RX written on 06/30/22 on 07/21/22 for a 30 day supply.

## 2022-08-19 NOTE — Telephone Encounter (Signed)
Pt is calling. Requesting a refill on medication tapentadol (NUCYNTA) 50 MG tablet. Refill should be sent to [id number]

## 2022-08-30 ENCOUNTER — Other Ambulatory Visit: Payer: Self-pay | Admitting: Neurology

## 2022-09-08 ENCOUNTER — Ambulatory Visit: Payer: Medicare PPO | Admitting: Internal Medicine

## 2022-09-08 ENCOUNTER — Other Ambulatory Visit: Payer: Self-pay | Admitting: Internal Medicine

## 2022-09-08 ENCOUNTER — Encounter: Payer: Self-pay | Admitting: Internal Medicine

## 2022-09-08 VITALS — BP 126/74 | HR 91 | Temp 98.2°F | Ht 69.0 in | Wt 214.0 lb

## 2022-09-08 DIAGNOSIS — E1140 Type 2 diabetes mellitus with diabetic neuropathy, unspecified: Secondary | ICD-10-CM

## 2022-09-08 DIAGNOSIS — F988 Other specified behavioral and emotional disorders with onset usually occurring in childhood and adolescence: Secondary | ICD-10-CM | POA: Diagnosis not present

## 2022-09-08 DIAGNOSIS — Z125 Encounter for screening for malignant neoplasm of prostate: Secondary | ICD-10-CM

## 2022-09-08 DIAGNOSIS — E538 Deficiency of other specified B group vitamins: Secondary | ICD-10-CM

## 2022-09-08 DIAGNOSIS — E0840 Diabetes mellitus due to underlying condition with diabetic neuropathy, unspecified: Secondary | ICD-10-CM | POA: Diagnosis not present

## 2022-09-08 DIAGNOSIS — E559 Vitamin D deficiency, unspecified: Secondary | ICD-10-CM

## 2022-09-08 DIAGNOSIS — I10 Essential (primary) hypertension: Secondary | ICD-10-CM

## 2022-09-08 DIAGNOSIS — Z0001 Encounter for general adult medical examination with abnormal findings: Secondary | ICD-10-CM

## 2022-09-08 DIAGNOSIS — E7800 Pure hypercholesterolemia, unspecified: Secondary | ICD-10-CM

## 2022-09-08 DIAGNOSIS — H6123 Impacted cerumen, bilateral: Secondary | ICD-10-CM

## 2022-09-08 LAB — URINALYSIS, ROUTINE W REFLEX MICROSCOPIC
Bilirubin Urine: NEGATIVE
Hgb urine dipstick: NEGATIVE
Ketones, ur: NEGATIVE
Leukocytes,Ua: NEGATIVE
Nitrite: NEGATIVE
Specific Gravity, Urine: 1.025 (ref 1.000–1.030)
Total Protein, Urine: NEGATIVE
Urine Glucose: >=1000 — AB
Urobilinogen, UA: 0.2 (ref 0.0–1.0)
pH: 5.5 (ref 5.0–8.0)

## 2022-09-08 LAB — BASIC METABOLIC PANEL
BUN: 19 mg/dL (ref 6–23)
CO2: 30 mEq/L (ref 19–32)
Calcium: 9.9 mg/dL (ref 8.4–10.5)
Chloride: 100 mEq/L (ref 96–112)
Creatinine, Ser: 1.15 mg/dL (ref 0.40–1.50)
GFR: 65.13 mL/min (ref >60.00)
Glucose, Bld: 128 mg/dL — ABNORMAL HIGH (ref 70–99)
Potassium: 4.1 mEq/L (ref 3.5–5.1)
Sodium: 140 mEq/L (ref 135–145)

## 2022-09-08 LAB — LIPID PANEL
Cholesterol: 117 mg/dL (ref 0–200)
HDL: 44.2 mg/dL (ref >39.00)
LDL Cholesterol: 38 mg/dL (ref 0–99)
NonHDL: 73.22
Total CHOL/HDL Ratio: 3
Triglycerides: 177 mg/dL — ABNORMAL HIGH (ref 0.0–149.0)
VLDL: 35.4 mg/dL (ref 0.0–40.0)

## 2022-09-08 LAB — CBC WITH DIFFERENTIAL/PLATELET
Basophils Absolute: 0.1 10*3/uL (ref 0.0–0.1)
Basophils Relative: 0.7 % (ref 0.0–3.0)
Eosinophils Absolute: 0.1 10*3/uL (ref 0.0–0.7)
Eosinophils Relative: 1.2 % (ref 0.0–5.0)
HCT: 48 % (ref 39.0–52.0)
Hemoglobin: 16.3 g/dL (ref 13.0–17.0)
Lymphocytes Relative: 38.2 % (ref 12.0–46.0)
Lymphs Abs: 2.9 10*3/uL (ref 0.7–4.0)
MCHC: 34 g/dL (ref 30.0–36.0)
MCV: 85.2 fl (ref 78.0–100.0)
Monocytes Absolute: 0.6 10*3/uL (ref 0.1–1.0)
Monocytes Relative: 7.4 % (ref 3.0–12.0)
Neutro Abs: 3.9 10*3/uL (ref 1.4–7.7)
Neutrophils Relative %: 52.5 % (ref 43.0–77.0)
Platelets: 204 10*3/uL (ref 150.0–400.0)
RBC: 5.63 Mil/uL (ref 4.22–5.81)
RDW: 14.6 % (ref 11.5–15.5)
WBC: 7.5 10*3/uL (ref 4.0–10.5)

## 2022-09-08 LAB — HEPATIC FUNCTION PANEL
ALT: 12 U/L (ref 0–53)
AST: 12 U/L (ref 0–37)
Albumin: 4.4 g/dL (ref 3.5–5.2)
Alkaline Phosphatase: 70 U/L (ref 39–117)
Bilirubin, Direct: 0.1 mg/dL (ref 0.0–0.3)
Total Bilirubin: 0.7 mg/dL (ref 0.2–1.2)
Total Protein: 7.3 g/dL (ref 6.0–8.3)

## 2022-09-08 LAB — PSA: PSA: 0.33 ng/mL (ref 0.10–4.00)

## 2022-09-08 LAB — MICROALBUMIN / CREATININE URINE RATIO
Creatinine,U: 79 mg/dL
Microalb Creat Ratio: 1 mg/g (ref 0.0–30.0)
Microalb, Ur: 0.8 mg/dL (ref 0.0–1.9)

## 2022-09-08 LAB — HEMOGLOBIN A1C: Hgb A1c MFr Bld: 7.7 % — ABNORMAL HIGH (ref 4.6–6.5)

## 2022-09-08 LAB — TSH: TSH: 1.82 u[IU]/mL (ref 0.35–5.50)

## 2022-09-08 LAB — VITAMIN B12: Vitamin B-12: 197 pg/mL — ABNORMAL LOW (ref 211–911)

## 2022-09-08 LAB — VITAMIN D 25 HYDROXY (VIT D DEFICIENCY, FRACTURES): VITD: 62.7 ng/mL (ref 30.00–100.00)

## 2022-09-08 MED ORDER — METHYLPHENIDATE HCL ER (OSM) 36 MG PO TBCR: 36.0000 mg | EXTENDED_RELEASE_TABLET | Freq: Every day | ORAL | 0 refills | Status: DC

## 2022-09-08 MED ORDER — GLIPIZIDE ER 2.5 MG PO TB24: 2.5000 mg | ORAL_TABLET | Freq: Every day | ORAL | 3 refills | Status: DC

## 2022-09-08 NOTE — Patient Instructions (Signed)
Please continue all other medications as before, and refills have been done for the Concerta - and please call to remind [place] once monthly if you need further concerta  Please have the pharmacy call with any other refills you may need.  Please continue your efforts at being more active, low cholesterol diet, and weight control.  You are otherwise up to date with prevention measures today.  Please keep your appointments with your specialists as you may have planned - Dr Dwyane Dee for sugar  Please go to the LAB at the blood drawing area for the tests to be done  You will be contacted by phone if any changes need to be made immediately.  Otherwise, you will receive a letter about your results with an explanation, but please check with MyChart first.  Please remember to sign up for MyChart if you have not done so, as this will be important to you in the future with finding out test results, communicating by private email, and scheduling acute appointments online when needed.  Please make an Appointment to return in 6 months, or sooner if needed

## 2022-09-08 NOTE — Progress Notes (Signed)
Patient ID: Frank [name], male   DOB: [birth year], 67 y.o.   MRN: [id number]         Chief Complaint:: wellness exam and DM, ADD, bilateral wax impactions, low b12 and vit d       HPI:  Frank [name] is a 67 y.o. male here for wellness exam; has eye appt in April 2024; declines all immunizations due to [place] hx.  O/w up to date                        Also Pt denies chest pain, increased sob or doe, wheezing, orthopnea, PND, increased LE swelling, palpitations, dizziness or syncope.   Pt denies polydipsia, polyuria, or new focal neuro s/s.    Pt denies fever, wt loss, night sweats, loss of appetite, or other constitutional symptoms.  Needs new refill for concerta as has been out for several months, difficult to concentrate or complete tasks  Has some reduced hearing in the past week bialteral, without pain or drainage or fever   Wt Readings from Last 3 Encounters:  09/08/22 214 lb (97.1 kg)  06/30/22 211 lb 9.6 oz (96 kg)  05/13/22 211 lb 3.2 oz (95.8 kg)   BP Readings from Last 3 Encounters:  09/08/22 126/74  06/30/22 128/75  05/13/22 (!) 144/66   Immunization History  Administered Date(s) Administered   Influenza Split 06/04/2012   Influenza Whole 05/20/2010   Influenza,inj,Quad PF,6+ Mos 05/05/2013, 05/17/2014   PFIZER(Purple Top)SARS-COV-2 Vaccination 11/21/2019, 12/19/2019, 07/17/2020, 05/24/2021   Pneumococcal Conjugate-13 05/31/2014   Pneumococcal Polysaccharide-23 05/20/2010   Td 08/11/2004   Health Maintenance Due  Topic Date Due   Medicare Annual Wellness (AWV)  Never done   DTaP/Tdap/Td (2 - Tdap) 08/11/2014      Past Medical History:  Diagnosis Date   Abdominal pain, left lower quadrant 06/06/2010   Abdominal pain, unspecified site 01/19/2009   ADD 10/09/2008   ALLERGIC RHINITIS 10/09/2008   Allergy    ANXIETY DEPRESSION 02/01/2008   Arthritis    hands ?   Blood transfusion without reported diagnosis    Cataract    removed both eyes    COLONIC  POLYPS 02/01/2008   DIABETES MELLITUS, TYPE II 05/20/2010   DYSPNEA 03/12/2010   ERECTILE DYSFUNCTION 10/09/2008   ERECTILE DYSFUNCTION, ORGANIC 05/20/2010   FOOT PAIN, LEFT 05/20/2010   GERD 02/01/2008   Guillain Barr syndrome (HCC)    HEMORRHOIDS 02/01/2008   HIATAL HERNIA 02/01/2008   HYPERLIPIDEMIA 10/09/2008   HYPERTENSION 10/09/2008   MORTON'S NEUROMA 05/20/2010   Other specified forms of hearing loss 06/27/2009   PERIPHERAL EDEMA 05/20/2010   PERIPHERAL NEUROPATHY 05/20/2010   Sleep apnea    stopped cpap    SLEEP APNEA, OBSTRUCTIVE 02/01/2008   Stricture and stenosis of esophagus 02/02/2008   Type II or unspecified type diabetes mellitus without mention of complication, uncontrolled 11/14/2010   WRIST PAIN, LEFT 12/05/2009   Past Surgical History:  Procedure Laterality Date   CARPAL TUNNEL RELEASE Bilateral    COLONOSCOPY     DENTAL SURGERY     ESOPHAGEAL DILATION  july 2009   ESOPHAGOGASTRODUODENOSCOPY N/A 06/27/2014   Procedure: ESOPHAGOGASTRODUODENOSCOPY (EGD);  Surgeon: Hart Carwin, MD;  Location: [facility];  Service: Endoscopy;  Laterality: N/A;   EYE SURGERY     catract surgery on both eyes   POLYPECTOMY     ROTATOR CUFF REPAIR     UPPER GASTROINTESTINAL ENDOSCOPY      reports  that he has never smoked. He has never used smokeless tobacco. He reports current alcohol use. He reports current drug use. Drug: Methaqualone. family history includes Depression in his mother; Diabetes in his brother and mother; Heart disease in his mother; Hyperlipidemia in his mother. Allergies  Allergen Reactions   Codeine Itching    REACTION: Itching   Influenza Vaccines     Due to hx of GBS   Metformin And Related Other (See Comments)    GI upset, diarrhea   Other Other (See Comments)    Due to hx of GBS- vaccines    Shellfish Allergy    Current Outpatient Medications on File Prior to Visit  Medication Sig Dispense Refill   Accu-Chek Softclix Lancets lancets Use to check blood sugars  daily 100 each 11   ARIPiprazole (ABILIFY) 10 MG tablet TAKE 1 TABLET EVERY DAY 90 tablet 3   Continuous Blood Gluc Sensor (DEXCOM G7 SENSOR) MISC Change every 10 days 3 each 2   cyclobenzaprine (FLEXERIL) 5 MG tablet Take 1 tablet (5 mg total) by mouth at bedtime. 30 tablet 5   dapagliflozin propanediol (FARXIGA) 10 MG TABS tablet Take 1 tablet (10 mg total) by mouth daily before breakfast. 90 tablet 3   DULoxetine (CYMBALTA) 60 MG capsule Take 1 capsule (60 mg total) by mouth 2 (two) times daily. 180 capsule 3   glucose blood (ACCU-CHEK GUIDE) test strip Use to check blood sugars daily 100 strip 11   hydrochlorothiazide (MICROZIDE) 12.5 MG capsule TAKE 1 CAPSULE(12.5 MG) BY MOUTH DAILY 90 capsule 3   insulin degludec (TRESIBA FLEXTOUCH) 200 UNIT/ML FlexTouch Pen Inject 74 Units into the skin daily. 42 mL 3   Insulin Pen Needle (B-D ULTRAFINE III SHORT PEN) 31G X 8 MM MISC USE AS DIRECTED EVERY DAY WITH TRESIBA PENS 100 each 2   LORazepam (ATIVAN) 0.5 MG tablet TAKE 1 TABLET BY MOUTH TWICE DAILY AS NEEDED 60 tablet 5   omeprazole (PRILOSEC OTC) 20 MG tablet Take 20 mg by mouth daily.     pregabalin (LYRICA) 200 MG capsule TAKE (1) CAPSULE TWO TIMES DAILY. 180 capsule 1   rosuvastatin (CRESTOR) 20 MG tablet 1 tab by mouth once daily 90 tablet 3   Semaglutide, 2 MG/DOSE, (OZEMPIC, 2 MG/DOSE,) 8 MG/3ML SOPN Inject 2 mg into the skin once a week. 9 mL 3   tapentadol (NUCYNTA) 50 MG tablet Take 1 tablet (50 mg total) by mouth 3 (three) times daily. 90 tablet 0   Current Facility-Administered Medications on File Prior to Visit  Medication Dose Route Frequency Provider Last Rate Last Admin   dextrose 5 % solution   Intravenous Once Danis, Starr Lake III, MD       dextrose 50 % solution 25 mL  25 mL Intravenous Once Danis, Andreas Blower, MD            ROS:  All others reviewed and negative.  Objective        PE:  BP 126/74 (BP Location: Left Arm, Patient Position: Sitting, Cuff Size: Large)   Pulse 91    Temp 98.2 F (36.8 C) (Oral)   Ht 5\' 9"  (1.753 m)   Wt 214 lb (97.1 kg)   SpO2 92%   BMI 31.60 kg/m                 Constitutional: Pt appears in NAD               HENT: Head: NCAT.  Right Ear: External ear normal.                 Left Ear: External ear normal.  Noted has bilateral ear wax impactoins               Eyes: . Pupils are equal, round, and reactive to light. Conjunctivae and EOM are normal               Nose: without d/c or deformity               Neck: Neck supple. Gross normal ROM               Cardiovascular: Normal rate and regular rhythm.                 Pulmonary/Chest: Effort normal and breath sounds without rales or wheezing.                Abd:  Soft, NT, ND, + BS, no organomegaly               Neurological: Pt is alert. At baseline orientation, motor grossly intact               Skin: Skin is warm. No rashes, no other new lesions, LE edema - none               Psychiatric: Pt behavior is normal without agitation   Micro: none  Cardiac tracings I have personally interpreted today:  none  Pertinent Radiological findings (summarize): none   Lab Results  Component Value Date   WBC 7.5 09/08/2022   HGB 16.3 09/08/2022   HCT 48.0 09/08/2022   PLT 204.0 09/08/2022   GLUCOSE 128 (H) 09/08/2022   CHOL 117 09/08/2022   TRIG 177.0 (H) 09/08/2022   HDL 44.20 09/08/2022   LDLDIRECT 130.0 03/03/2022   LDLCALC 38 09/08/2022   ALT 12 09/08/2022   AST 12 09/08/2022   NA 140 09/08/2022   K 4.1 09/08/2022   CL 100 09/08/2022   CREATININE 1.15 09/08/2022   BUN 19 09/08/2022   CO2 30 09/08/2022   TSH 1.82 09/08/2022   PSA 0.33 09/08/2022   HGBA1C 7.7 (H) 09/08/2022   MICROALBUR 0.8 09/08/2022   Assessment/Plan:  Frank [name] is a 67 y.o. White or Caucasian [1] male with  has a past medical history of Abdominal pain, left lower quadrant (06/06/2010), Abdominal pain, unspecified site (01/19/2009), ADD (10/09/2008), ALLERGIC RHINITIS  (10/09/2008), Allergy, ANXIETY DEPRESSION (02/01/2008), Arthritis, Blood transfusion without reported diagnosis, Cataract, COLONIC POLYPS (02/01/2008), DIABETES MELLITUS, TYPE II (05/20/2010), DYSPNEA (03/12/2010), ERECTILE DYSFUNCTION (10/09/2008), ERECTILE DYSFUNCTION, ORGANIC (05/20/2010), FOOT PAIN, LEFT (05/20/2010), GERD (02/01/2008), Guillain Barr syndrome (HCC), HEMORRHOIDS (02/01/2008), HIATAL HERNIA (02/01/2008), HYPERLIPIDEMIA (10/09/2008), HYPERTENSION (10/09/2008), MORTON'S NEUROMA (05/20/2010), Other specified forms of hearing loss (06/27/2009), PERIPHERAL EDEMA (05/20/2010), PERIPHERAL NEUROPATHY (05/20/2010), Sleep apnea, SLEEP APNEA, OBSTRUCTIVE (02/01/2008), Stricture and stenosis of esophagus (02/02/2008), Type II or unspecified type diabetes mellitus without mention of complication, uncontrolled (11/14/2010), and WRIST PAIN, LEFT (12/05/2009).  Attention deficit disorder Uncontrlled, to restart concerta 36 mg daily  Encounter for well adult exam with abnormal findings Age and sex appropriate education and counseling updated with regular exercise and diet Referrals for preventative services - has eye appt for April 2024 Immunizations addressed - none accepted due to hx of [place] Smoking counseling  - none needed Evidence for depression or other mood disorder - none significant Most recent labs reviewed. I have personally reviewed and have  noted: 1) the patient's medical and social history 2) The patient's current medications and supplements 3) The patient's height, weight, and BMI have been recorded in the chart   B12 deficiency Lab Results  Component Value Date   VITAMINB12 197 (L) 09/08/2022   Low, to start oral replacement - b12 1000 mcg qd   Diabetes mellitus due to underlying condition with diabetic neuropathy (HCC) Lab Results  Component Value Date   HGBA1C 7.7 (H) 09/08/2022   Very mild uncontrolled, pt to continue current medical treatment farxiga 10, add glipizide ER  2.5 mg daily, f/u endo as planned    Essential hypertension BP Readings from Last 3 Encounters:  09/08/22 126/74  06/30/22 128/75  05/13/22 (!) 144/66   Stable, pt to continue medical treatment hct 12.5 mg qd   Hyperlipidemia Lab Results  Component Value Date   LDLCALC 38 09/08/2022   Stable, pt to continue current statin crestor 20 mg qd   Vitamin D deficiency Last vitamin D Lab Results  Component Value Date   VD25OH 62.70 09/08/2022   Stable, cont oral replacement   Bilateral hearing loss due to cerumen impaction D/w p t- he prefers home irrigation Followup: Return in about 6 months (around 03/09/2023).  Oliver Barre, MD 09/11/2022 10:16 AM [facility]

## 2022-09-11 ENCOUNTER — Encounter: Payer: Self-pay | Admitting: Internal Medicine

## 2022-09-11 DIAGNOSIS — H6123 Impacted cerumen, bilateral: Secondary | ICD-10-CM | POA: Insufficient documentation

## 2022-09-11 NOTE — Assessment & Plan Note (Signed)
Lab Results  Component Value Date   HGBA1C 7.7 (H) 09/08/2022   Very mild uncontrolled, pt to continue current medical treatment farxiga 10, add glipizide ER 2.5 mg daily, f/u endo as planned

## 2022-09-11 NOTE — Assessment & Plan Note (Signed)
BP Readings from Last 3 Encounters:  09/08/22 126/74  06/30/22 128/75  05/13/22 (!) 144/66   Stable, pt to continue medical treatment hct 12.5 mg qd

## 2022-09-11 NOTE — Assessment & Plan Note (Signed)
Uncontrlled, to restart concerta 36 mg daily

## 2022-09-11 NOTE — Assessment & Plan Note (Signed)
Age and sex appropriate education and counseling updated with regular exercise and diet Referrals for preventative services - has eye appt for April 2024 Immunizations addressed - none accepted due to hx of [place] Smoking counseling  - none needed Evidence for depression or other mood disorder - none significant Most recent labs reviewed. I have personally reviewed and have noted: 1) the patient's medical and social history 2) The patient's current medications and supplements 3) The patient's height, weight, and BMI have been recorded in the chart

## 2022-09-11 NOTE — Assessment & Plan Note (Signed)
D/w p t- he prefers home irrigation

## 2022-09-11 NOTE — Assessment & Plan Note (Signed)
Last vitamin D Lab Results  Component Value Date   VD25OH 62.70 09/08/2022   Stable, cont oral replacement

## 2022-09-11 NOTE — Assessment & Plan Note (Signed)
Lab Results  Component Value Date   LDLCALC 38 09/08/2022   Stable, pt to continue current statin crestor 20 mg qd

## 2022-09-11 NOTE — Assessment & Plan Note (Signed)
Lab Results  Component Value Date   VITAMINB12 197 (L) 09/08/2022   Low, to start oral replacement - b12 1000 mcg qd

## 2022-09-12 ENCOUNTER — Other Ambulatory Visit (INDEPENDENT_AMBULATORY_CARE_PROVIDER_SITE_OTHER): Payer: Medicare PPO

## 2022-09-12 DIAGNOSIS — E1165 Type 2 diabetes mellitus with hyperglycemia: Secondary | ICD-10-CM

## 2022-09-12 DIAGNOSIS — Z794 Long term (current) use of insulin: Secondary | ICD-10-CM | POA: Diagnosis not present

## 2022-09-12 LAB — BASIC METABOLIC PANEL
BUN: 19 mg/dL (ref 6–23)
CO2: 32 mEq/L (ref 19–32)
Calcium: 9.8 mg/dL (ref 8.4–10.5)
Chloride: 98 mEq/L (ref 96–112)
Creatinine, Ser: 1.11 mg/dL (ref 0.40–1.50)
GFR: 67.96 mL/min (ref >60.00)
Glucose, Bld: 204 mg/dL — ABNORMAL HIGH (ref 70–99)
Potassium: 4.2 mEq/L (ref 3.5–5.1)
Sodium: 138 mEq/L (ref 135–145)

## 2022-09-12 LAB — HEMOGLOBIN A1C: Hgb A1c MFr Bld: 7.6 % — ABNORMAL HIGH (ref 4.6–6.5)

## 2022-09-16 ENCOUNTER — Other Ambulatory Visit: Payer: Self-pay | Admitting: Neurology

## 2022-09-16 MED ORDER — TAPENTADOL HCL 50 MG PO TABS: 50.0000 mg | ORAL_TABLET | Freq: Three times a day (TID) | ORAL | 0 refills | Status: DC

## 2022-09-16 NOTE — Telephone Encounter (Signed)
Follow up scheduled on 12/08/22 

## 2022-09-16 NOTE — Telephone Encounter (Signed)
Pt request refill for tapentadol (NUCYNTA) 50 MG tablet at [id number]

## 2022-09-16 NOTE — Telephone Encounter (Signed)
Pt last seen 06/30/22 and next f/u 12/08/22. Per drug registry, last refilled 08/22/22 #90.

## 2022-09-17 ENCOUNTER — Ambulatory Visit: Payer: Medicare PPO | Admitting: Endocrinology

## 2022-09-17 ENCOUNTER — Encounter: Payer: Self-pay | Admitting: Endocrinology

## 2022-09-17 VITALS — BP 122/78 | HR 97 | Ht 69.0 in | Wt 213.8 lb

## 2022-09-17 DIAGNOSIS — E538 Deficiency of other specified B group vitamins: Secondary | ICD-10-CM

## 2022-09-17 DIAGNOSIS — Z794 Long term (current) use of insulin: Secondary | ICD-10-CM | POA: Diagnosis not present

## 2022-09-17 DIAGNOSIS — E1165 Type 2 diabetes mellitus with hyperglycemia: Secondary | ICD-10-CM

## 2022-09-17 NOTE — Progress Notes (Signed)
Patient ID: Frank [name], male   DOB: 1956/04/28, 67 y.o.   MRN: [id number]           Reason for Appointment: Type II Diabetes follow-up   History of Present Illness   Diagnosis date: 2012  Previous history:  His A1c has ranged between 7 and 10.8 since 2018 Farxiga was started in 2022, was on Ozempic since 7/22, previously he took Trulicity for a year  Recent history:     Non-insulin hypoglycemic drugs: Farxiga 10 mg daily, Ozempic 2 mg weekly     Insulin regimen: Tresiba 70 units daily           Side effects from medications: Diarrhea from metformin ER  Current self management, blood sugar patterns and problems identified:  A1c is 7.6 He has had much higher blood sugars in the last 6 weeks with not being able to get his Ozempic at the pharmacy His male companion states that he is eating all the time when he is not on Ozempic and also eating sweets especially at night Also asking about switching to diet drinks He will have blood sugar spikes at all times including overnight However with starting the Ozempic back about a week ago his blood sugars in the last 3 to 4 days are looking generally fairly well-controlled with only occasional postprandial spikes Also not hypoglycemic with current regimen of 70 units Has been using the Dexcom consistently and using the arm  Exercise: none   Diet management: Sometimes will have special K cereal in the morning otherwise skipping breakfast, usually avoiding fast food   Analysis of the Dexcom sensor download for the last 2 weeks as follows  His blood sugars were generally higher until about 2/4 when they have been improved Overnight blood sugars show high variability with periodic flares of hyperglycemia occasionally for several hours In the last 4 days his blood sugars overnight have been generally in the low to mid 100 range Premeal blood sugars are usually high at lunchtime but may be from eating a late breakfast HYPERGLYCEMIC  spikes at mealtimes are frequently present through February 3 but mostly overnight and after lunch  No hypoglycemia   CGM use % of time   2-week average/GV 176/30  Time in range      59% was 91  % Time Above 180 32  % Time above 250 9  % Time Below 70      Previously CGM use % of time   2-week average/GV 125/27  Time in range        91%  % Time Above 180 8  % Time above 250   % Time Below 70 1              CDE visit: Most recent: 8/23, nutritionist: 2019  Weight history:  Wt Readings from Last 3 Encounters:  09/17/22 213 lb 12.8 oz (97 kg)  09/08/22 214 lb (97.1 kg)  06/30/22 211 lb 9.6 oz (96 kg)            Diabetes labs:  Lab Results  Component Value Date   HGBA1C 7.6 (H) 09/12/2022   HGBA1C 7.7 (H) 09/08/2022   HGBA1C 6.2 05/09/2022   Lab Results  Component Value Date   MICROALBUR 0.8 09/08/2022   LDLCALC 38 09/08/2022   CREATININE 1.11 09/12/2022     Allergies as of 09/17/2022       Reactions   Codeine Itching   REACTION: Itching   Influenza Vaccines  Due to hx of GBS   Metformin And Related Other (See Comments)   GI upset, diarrhea   Other Other (See Comments)   Due to hx of GBS- vaccines    Shellfish Allergy         Medication List        Accurate as of September 17, 2022  4:20 PM. If you have any questions, ask your nurse or doctor.          Accu-Chek Guide test strip Generic drug: glucose blood Use to check blood sugars daily   Accu-Chek Softclix Lancets lancets Use to check blood sugars daily   ARIPiprazole 10 MG tablet Commonly known as: ABILIFY TAKE 1 TABLET EVERY DAY   B-D ULTRAFINE III SHORT PEN 31G X 8 MM Misc Generic drug: Insulin Pen Needle USE AS DIRECTED EVERY DAY WITH TRESIBA PENS   cyclobenzaprine 5 MG tablet Commonly known as: FLEXERIL Take 1 tablet (5 mg total) by mouth at bedtime.   dapagliflozin propanediol 10 MG Tabs tablet Commonly known as: Farxiga Take 1 tablet (10 mg total) by mouth daily before  breakfast.   Dexcom G7 Sensor Misc Change every 10 days   DULoxetine 60 MG capsule Commonly known as: CYMBALTA Take 1 capsule (60 mg total) by mouth 2 (two) times daily.   glipiZIDE 2.5 MG 24 hr tablet Commonly known as: GLUCOTROL XL Take 1 tablet (2.5 mg total) by mouth daily with breakfast.   hydrochlorothiazide 12.5 MG capsule Commonly known as: MICROZIDE TAKE 1 CAPSULE(12.5 MG) BY MOUTH DAILY   LORazepam 0.5 MG tablet Commonly known as: ATIVAN TAKE 1 TABLET BY MOUTH TWICE DAILY AS NEEDED   methylphenidate 36 MG CR tablet Commonly known as: Concerta Take 1 tablet (36 mg total) by mouth daily.   omeprazole 20 MG tablet Commonly known as: PRILOSEC OTC Take 20 mg by mouth daily.   Ozempic (2 MG/DOSE) 8 MG/3ML Sopn Generic drug: Semaglutide (2 MG/DOSE) Inject 2 mg into the skin once a week.   pregabalin 200 MG capsule Commonly known as: Lyrica TAKE (1) CAPSULE TWO TIMES DAILY.   rosuvastatin 20 MG tablet Commonly known as: CRESTOR 1 tab by mouth once daily   tapentadol 50 MG tablet Commonly known as: Nucynta Take 1 tablet (50 mg total) by mouth 3 (three) times daily.   Tyler Aas FlexTouch 200 UNIT/ML FlexTouch Pen Generic drug: insulin degludec Inject 74 Units into the skin daily.        Allergies:  Allergies  Allergen Reactions   Codeine Itching    REACTION: Itching   Influenza Vaccines     Due to hx of GBS   Metformin And Related Other (See Comments)    GI upset, diarrhea   Other Other (See Comments)    Due to hx of GBS- vaccines    Shellfish Allergy     Past Medical History:  Diagnosis Date   Abdominal pain, left lower quadrant 06/06/2010   Abdominal pain, unspecified site 01/19/2009   ADD 10/09/2008   ALLERGIC RHINITIS 10/09/2008   Allergy    ANXIETY DEPRESSION 02/01/2008   Arthritis    hands ?   Blood transfusion without reported diagnosis    Cataract    removed both eyes    COLONIC POLYPS 02/01/2008   DIABETES MELLITUS, TYPE II 05/20/2010    DYSPNEA 03/12/2010   ERECTILE DYSFUNCTION 10/09/2008   ERECTILE DYSFUNCTION, ORGANIC 05/20/2010   FOOT PAIN, LEFT 05/20/2010   GERD 02/01/2008   Guillain Barr syndrome ([place])  HEMORRHOIDS 02/01/2008   HIATAL HERNIA 02/01/2008   HYPERLIPIDEMIA 10/09/2008   HYPERTENSION 10/09/2008   MORTON'S NEUROMA 05/20/2010   Other specified forms of hearing loss 06/27/2009   PERIPHERAL EDEMA 05/20/2010   PERIPHERAL NEUROPATHY 05/20/2010   Sleep apnea    stopped cpap    SLEEP APNEA, OBSTRUCTIVE 02/01/2008   Stricture and stenosis of esophagus 02/02/2008   Type II or unspecified type diabetes mellitus without mention of complication, uncontrolled 11/14/2010   WRIST PAIN, LEFT 12/05/2009    Past Surgical History:  Procedure Laterality Date   CARPAL TUNNEL RELEASE Bilateral    COLONOSCOPY     DENTAL SURGERY     ESOPHAGEAL DILATION  july 2009   ESOPHAGOGASTRODUODENOSCOPY N/A 06/27/2014   Procedure: ESOPHAGOGASTRODUODENOSCOPY (EGD);  Surgeon: Lafayette Dragon, MD;  Location: [facility];  Service: Endoscopy;  Laterality: N/A;   EYE SURGERY     catract surgery on both eyes   POLYPECTOMY     ROTATOR CUFF REPAIR     UPPER GASTROINTESTINAL ENDOSCOPY      Family History  Problem Relation Age of Onset   Diabetes Mother    Heart disease Mother    Hyperlipidemia Mother    Depression Mother    Diabetes Brother    Colon cancer Neg Hx    Colon polyps Neg Hx    Esophageal cancer Neg Hx    Rectal cancer Neg Hx    Stomach cancer Neg Hx     Social History:  reports that he has never smoked. He has never used smokeless tobacco. He reports current alcohol use. He reports current drug use. Drug: Methaqualone.  Review of Systems:  Last diabetic eye exam date: 2020 with Dr. Katy Fitch  Last foot exam date:7/22  Hypertension: Treated with HCTZ 12.5 mg daily by his PCP  BP Readings from Last 3 Encounters:  09/17/22 122/78  09/08/22 126/74  06/30/22 128/75    Lipids: Well-controlled with rosuvastatin 20 mg  daily    Lab Results  Component Value Date   CHOL 117 09/08/2022   CHOL 221 (H) 03/03/2022   CHOL 104 12/26/2021   Lab Results  Component Value Date   HDL 44.20 09/08/2022   HDL 48.30 03/03/2022   HDL 41.50 12/26/2021   Lab Results  Component Value Date   LDLCALC 38 09/08/2022   LDLCALC 33 12/26/2021   LDLCALC 23 11/16/2017   Lab Results  Component Value Date   TRIG 177.0 (H) 09/08/2022   TRIG 245.0 (H) 03/03/2022   TRIG 150.0 (H) 12/26/2021   Lab Results  Component Value Date   CHOLHDL 3 09/08/2022   CHOLHDL 5 03/03/2022   CHOLHDL 3 12/26/2021   Lab Results  Component Value Date   LDLDIRECT 130.0 03/03/2022   LDLDIRECT 94.0 07/02/2020   LDLDIRECT 57.0 06/08/2019   NEUROPATHY: He has numbness in his feet and balance issues Is on Lyrica for symptomatic control Does not see a foot doctor but is followed by neurologist His roommate cuts his toenails  B12 deficiency: He appears to have persistently low B12 levels His male companion says that he is supposed to be taking B12 2000 mcg daily but not clear if he has taken it lately  Lab Results  Component Value Date   VITAMINB12 197 (L) 09/08/2022      Examination:   BP 122/78 (BP Location: Left Arm, Patient Position: Sitting, Cuff Size: Normal)   Pulse 97   Ht '5\' 9"'$  (1.753 m)   Wt 213 lb 12.8 oz (97  kg)   SpO2 96%   BMI 31.57 kg/m   Body mass index is 31.57 kg/m.     ASSESSMENT/ PLAN:    Diabetes type 2:   Current regimen: Basal insulin 74 units, Ozempic and Farxiga  Blood glucose control is not as good with A1c 7.6 compared to 6.2  He likely has worsening of his control with running out of Ozempic  Unable to follow his diet well unless he is on Ozempic and not motivated to exercise In the last few days blood sugars are much better with getting back on Ozempic  For now he will continue the same regimen Recommended that he stay off snacks and sweets especially at night and watch his blood sugars  after meals  Discussed implications of J17 deficiency and need to take supplements regularly If he has been taking the supplements lately then he needs to discuss going to injections with his PCP  LIPIDS: Well-controlled with LDL well below 70  Patient Instructions  Avoid snacks at night   Elayne Snare 09/17/2022, 4:20 PM

## 2022-09-17 NOTE — Patient Instructions (Signed)
Avoid snacks at night

## 2022-09-18 ENCOUNTER — Encounter: Payer: Self-pay | Admitting: Endocrinology

## 2022-09-23 DIAGNOSIS — E1165 Type 2 diabetes mellitus with hyperglycemia: Secondary | ICD-10-CM | POA: Diagnosis not present

## 2022-09-24 ENCOUNTER — Other Ambulatory Visit: Payer: Self-pay | Admitting: Internal Medicine

## 2022-10-13 ENCOUNTER — Other Ambulatory Visit: Payer: Self-pay | Admitting: Neurology

## 2022-10-13 MED ORDER — TAPENTADOL HCL 50 MG PO TABS: 50.0000 mg | ORAL_TABLET | Freq: Three times a day (TID) | ORAL | 0 refills | Status: DC

## 2022-10-13 NOTE — Telephone Encounter (Signed)
Per drug registry, last refilled 09/18/22 #90. Last seen 06/30/22 and next f/u 12/08/22.

## 2022-10-13 NOTE — Telephone Encounter (Signed)
Pt called stated he need a refill on tapentadol (NUCYNTA) 50 MG tablet. [id number]

## 2022-10-22 ENCOUNTER — Other Ambulatory Visit: Payer: Self-pay | Admitting: Neurology

## 2022-11-02 ENCOUNTER — Emergency Department (HOSPITAL_COMMUNITY): Payer: Medicare PPO

## 2022-11-02 ENCOUNTER — Emergency Department (HOSPITAL_COMMUNITY)
Admission: EM | Admit: 2022-11-02 | Discharge: 2022-11-03 | Disposition: A | Discharge status: Home / Self Care | Payer: Medicare PPO | Attending: Emergency Medicine | Admitting: Emergency Medicine

## 2022-11-02 ENCOUNTER — Other Ambulatory Visit: Payer: Self-pay

## 2022-11-02 ENCOUNTER — Encounter (HOSPITAL_COMMUNITY): Payer: Self-pay

## 2022-11-02 DIAGNOSIS — Z7984 Long term (current) use of oral hypoglycemic drugs: Secondary | ICD-10-CM | POA: Diagnosis not present

## 2022-11-02 DIAGNOSIS — R1032 Left lower quadrant pain: Secondary | ICD-10-CM

## 2022-11-02 DIAGNOSIS — K760 Fatty (change of) liver, not elsewhere classified: Secondary | ICD-10-CM | POA: Diagnosis not present

## 2022-11-02 DIAGNOSIS — K5900 Constipation, unspecified: Secondary | ICD-10-CM | POA: Diagnosis not present

## 2022-11-02 DIAGNOSIS — Z794 Long term (current) use of insulin: Secondary | ICD-10-CM | POA: Diagnosis not present

## 2022-11-02 DIAGNOSIS — E1140 Type 2 diabetes mellitus with diabetic neuropathy, unspecified: Secondary | ICD-10-CM | POA: Diagnosis not present

## 2022-11-02 DIAGNOSIS — D72829 Elevated white blood cell count, unspecified: Secondary | ICD-10-CM | POA: Diagnosis not present

## 2022-11-02 DIAGNOSIS — N281 Cyst of kidney, acquired: Secondary | ICD-10-CM | POA: Diagnosis not present

## 2022-11-02 DIAGNOSIS — Z79899 Other long term (current) drug therapy: Secondary | ICD-10-CM | POA: Diagnosis not present

## 2022-11-02 DIAGNOSIS — I10 Essential (primary) hypertension: Secondary | ICD-10-CM | POA: Insufficient documentation

## 2022-11-02 LAB — URINALYSIS, ROUTINE W REFLEX MICROSCOPIC
Bacteria, UA: NONE SEEN
Bilirubin Urine: NEGATIVE
Glucose, UA: >=500 mg/dL — AB
Hgb urine dipstick: NEGATIVE
Ketones, ur: NEGATIVE mg/dL
Leukocytes,Ua: NEGATIVE
Nitrite: NEGATIVE
Protein, ur: NEGATIVE mg/dL
Specific Gravity, Urine: 1.024 (ref 1.005–1.030)
pH: 5 (ref 5.0–8.0)

## 2022-11-02 LAB — COMPREHENSIVE METABOLIC PANEL
ALT: 15 U/L (ref 0–44)
AST: 14 U/L — ABNORMAL LOW (ref 15–41)
Albumin: 4.6 g/dL (ref 3.5–5.0)
Alkaline Phosphatase: 77 U/L (ref 38–126)
Anion gap: 11 (ref 5–15)
BUN: 16 mg/dL (ref 8–23)
CO2: 26 mmol/L (ref 22–32)
Calcium: 9.4 mg/dL (ref 8.9–10.3)
Chloride: 99 mmol/L (ref 98–111)
Creatinine, Ser: 1.12 mg/dL (ref 0.61–1.24)
GFR, Estimated: >60 mL/min (ref >60)
Glucose, Bld: 124 mg/dL — ABNORMAL HIGH (ref 70–99)
Potassium: 3.8 mmol/L (ref 3.5–5.1)
Sodium: 136 mmol/L (ref 135–145)
Total Bilirubin: 1.3 mg/dL — ABNORMAL HIGH (ref 0.3–1.2)
Total Protein: 8.3 g/dL — ABNORMAL HIGH (ref 6.5–8.1)

## 2022-11-02 LAB — CBC
HCT: 52.1 % — ABNORMAL HIGH (ref 39.0–52.0)
Hemoglobin: 17.4 g/dL — ABNORMAL HIGH (ref 13.0–17.0)
MCH: 28.5 pg (ref 26.0–34.0)
MCHC: 33.4 g/dL (ref 30.0–36.0)
MCV: 85.3 fL (ref 80.0–100.0)
Platelets: 187 10*3/uL (ref 150–400)
RBC: 6.11 MIL/uL — ABNORMAL HIGH (ref 4.22–5.81)
RDW: 14.3 % (ref 11.5–15.5)
WBC: 11.8 10*3/uL — ABNORMAL HIGH (ref 4.0–10.5)
nRBC: 0 % (ref 0.0–0.2)

## 2022-11-02 LAB — LIPASE, BLOOD: Lipase: 28 U/L (ref 11–51)

## 2022-11-02 MED ORDER — FENTANYL CITRATE PF 50 MCG/ML IJ SOSY
50.0000 ug | PREFILLED_SYRINGE | Freq: Once | INTRAMUSCULAR | Status: AC
  Administered 2022-11-02: 50 ug via INTRAVENOUS
  Filled 2022-11-02: qty 1

## 2022-11-02 MED ORDER — SODIUM CHLORIDE 0.9 % IV BOLUS
1000.0000 mL | Freq: Once | INTRAVENOUS | Status: AC
  Administered 2022-11-02: 1000 mL via INTRAVENOUS

## 2022-11-02 MED ORDER — IOHEXOL 300 MG/ML  SOLN
100.0000 mL | Freq: Once | INTRAMUSCULAR | Status: AC | PRN
  Administered 2022-11-02: 100 mL via INTRAVENOUS

## 2022-11-02 MED ORDER — LORAZEPAM 0.5 MG PO TABS
0.5000 mg | ORAL_TABLET | Freq: Once | ORAL | Status: AC
  Administered 2022-11-02: 0.5 mg via ORAL
  Filled 2022-11-02: qty 1

## 2022-11-02 NOTE — ED Notes (Signed)
Patient transported to CT 

## 2022-11-02 NOTE — ED Notes (Signed)
Sent urine with culture  

## 2022-11-02 NOTE — ED Provider Notes (Signed)
[facility] EMERGENCY DEPARTMENT AT [facility] Provider Note   CSN: [id number] Arrival date & time: 11/02/22  1802     History  Chief Complaint  Patient presents with   Abdominal Pain    Frank [name] is a 67 y.o. male with past medical history as outlined below presents to the ED complaining of lower left abdominal pain that started today.  He reports that his last bowel movement was yesterday, however, he had to use laxatives to have a bowel movement.  He cannot remember when his last bowel movement was prior to yesterday.  Patient does have issues with constipation.  Denies fever, chills, nausea, vomiting, diarrhea, dysuria, syncope.   Past Medical History:  Diagnosis Date   Abdominal pain, left lower quadrant 06/06/2010   Abdominal pain, unspecified site 01/19/2009   ADD 10/09/2008   ALLERGIC RHINITIS 10/09/2008   Allergy    ANXIETY DEPRESSION 02/01/2008   Arthritis    hands ?   Blood transfusion without reported diagnosis    Cataract    removed both eyes    COLONIC POLYPS 02/01/2008   DIABETES MELLITUS, TYPE II 05/20/2010   DYSPNEA 03/12/2010   ERECTILE DYSFUNCTION 10/09/2008   ERECTILE DYSFUNCTION, ORGANIC 05/20/2010   FOOT PAIN, LEFT 05/20/2010   GERD 02/01/2008   Guillain Barr syndrome ([place])    HEMORRHOIDS 02/01/2008   HIATAL HERNIA 02/01/2008   HYPERLIPIDEMIA 10/09/2008   HYPERTENSION 10/09/2008   MORTON'S NEUROMA 05/20/2010   Other specified forms of hearing loss 06/27/2009   PERIPHERAL EDEMA 05/20/2010   PERIPHERAL NEUROPATHY 05/20/2010   Sleep apnea    stopped cpap    SLEEP APNEA, OBSTRUCTIVE 02/01/2008   Stricture and stenosis of esophagus 02/02/2008   Type II or unspecified type diabetes mellitus without mention of complication, uncontrolled 11/14/2010   WRIST PAIN, LEFT 12/05/2009        Home Medications Prior to Admission medications   Medication Sig Start Date End Date Taking? Authorizing Provider  LORazepam (ATIVAN) 1 MG tablet TAKE 1 TABLET  BY MOUTH TWICE A DAY AS NEEDED 09/24/22   Biagio Borg, MD  Accu-Chek Softclix Lancets lancets Use to check blood sugars daily 05/29/22   Elayne Snare, MD  ARIPiprazole (ABILIFY) 10 MG tablet TAKE 1 TABLET EVERY DAY 09/01/22   Sater, Nanine Means, MD  Continuous Blood Gluc Sensor (DEXCOM G7 SENSOR) MISC Change every 10 days 05/13/22   Elayne Snare, MD  cyclobenzaprine (FLEXERIL) 5 MG tablet TAKE 1 TABLET AT BEDTIME 10/22/22   Sater, Nanine Means, MD  dapagliflozin propanediol (FARXIGA) 10 MG TABS tablet Take 1 tablet (10 mg total) by mouth daily before breakfast. 05/29/22   Elayne Snare, MD  DULoxetine (CYMBALTA) 60 MG capsule Take 1 capsule (60 mg total) by mouth 2 (two) times daily. 05/29/22   Sater, Nanine Means, MD  glipiZIDE (GLUCOTROL XL) 2.5 MG 24 hr tablet Take 1 tablet (2.5 mg total) by mouth daily with breakfast. 09/08/22   Biagio Borg, MD  glucose blood (ACCU-CHEK GUIDE) test strip Use to check blood sugars daily 05/29/22   Elayne Snare, MD  hydrochlorothiazide (MICROZIDE) 12.5 MG capsule TAKE 1 CAPSULE(12.5 MG) BY MOUTH DAILY 03/03/22   Biagio Borg, MD  insulin degludec (TRESIBA FLEXTOUCH) 200 UNIT/ML FlexTouch Pen Inject 74 Units into the skin daily. 03/11/22   Elayne Snare, MD  Insulin Pen Needle (B-D ULTRAFINE III SHORT PEN) 31G X 8 MM MISC USE AS DIRECTED EVERY DAY WITH TRESIBA PENS 05/29/22   Elayne Snare,  MD  LORazepam (ATIVAN) 0.5 MG tablet TAKE 1 TABLET BY MOUTH TWICE DAILY AS NEEDED 03/03/22   Biagio Borg, MD  methylphenidate (CONCERTA) 36 MG PO CR tablet Take 1 tablet (36 mg total) by mouth daily. 09/08/22   Biagio Borg, MD  omeprazole (PRILOSEC OTC) 20 MG tablet Take 20 mg by mouth daily.    [provider]  pregabalin (LYRICA) 200 MG capsule TAKE (1) CAPSULE TWO TIMES DAILY. 07/06/22   Sater, Nanine Means, MD  rosuvastatin (CRESTOR) 20 MG tablet 1 tab by mouth once daily 03/03/22   Biagio Borg, MD  Semaglutide, 2 MG/DOSE, (OZEMPIC, 2 MG/DOSE,) 8 MG/3ML SOPN Inject 2 mg into the skin  once a week. 12/18/21   Elayne Snare, MD  tapentadol (NUCYNTA) 50 MG tablet Take 1 tablet (50 mg total) by mouth 3 (three) times daily. 10/13/22   Sater, Nanine Means, MD      Allergies    Codeine, Influenza vaccines, Metformin and related, Other, and Shellfish allergy    Review of Systems   Review of Systems  Constitutional:  Negative for chills and fever.  Gastrointestinal:  Positive for abdominal pain and constipation. Negative for diarrhea, nausea and vomiting.  Genitourinary:  Negative for dysuria.  Neurological:  Negative for syncope.    Physical Exam Updated Vital Signs BP (!) 152/98   Pulse 95   Temp 98 F (36.7 C) (Oral)   Resp 13   Ht 5\' 9"  (1.753 m)   Wt 97.1 kg   SpO2 98%   BMI 31.60 kg/m  Physical Exam Vitals and nursing note reviewed.  Constitutional:      General: He is not in acute distress.    Appearance: He is not ill-appearing or diaphoretic.  HENT:     Mouth/Throat:     Mouth: Mucous membranes are moist.     Pharynx: Oropharynx is clear.  Cardiovascular:     Rate and Rhythm: Normal rate and regular rhythm.     Pulses: Normal pulses.     Heart sounds: Normal heart sounds.  Pulmonary:     Effort: Pulmonary effort is normal. No respiratory distress.     Breath sounds: Normal breath sounds and air entry.  Abdominal:     General: Abdomen is flat. Bowel sounds are normal. There is no distension.     Palpations: Abdomen is soft.     Tenderness: There is abdominal tenderness in the left lower quadrant.     Hernia: No hernia is present.  Skin:    General: Skin is warm and dry.     Capillary Refill: Capillary refill takes less than 2 seconds.  Neurological:     Mental Status: He is alert. Mental status is at baseline.  Psychiatric:        Mood and Affect: Mood normal.        Behavior: Behavior normal.     ED Results / Procedures / Treatments   Labs (all labs ordered are listed, but only abnormal results are displayed) Labs Reviewed  COMPREHENSIVE  METABOLIC PANEL - Abnormal; Notable for the following components:      Result Value   Glucose, Bld 124 (*)    Total Protein 8.3 (*)    AST 14 (*)    Total Bilirubin 1.3 (*)    All other components within normal limits  CBC - Abnormal; Notable for the following components:   WBC 11.8 (*)    RBC 6.11 (*)    Hemoglobin 17.4 (*)  HCT 52.1 (*)    All other components within normal limits  URINALYSIS, ROUTINE W REFLEX MICROSCOPIC - Abnormal; Notable for the following components:   Glucose, UA >=500 (*)    All other components within normal limits  LIPASE, BLOOD    EKG None  Radiology CT ABDOMEN PELVIS W CONTRAST  Result Date: 11/02/2022 CLINICAL DATA:  Left lower quadrant pain EXAM: CT ABDOMEN AND PELVIS WITH CONTRAST TECHNIQUE: Multidetector CT imaging of the abdomen and pelvis was performed using the standard protocol following bolus administration of intravenous contrast. RADIATION DOSE REDUCTION: This exam was performed according to the departmental dose-optimization program which includes automated exposure control, adjustment of the mA and/or kV according to patient size and/or use of iterative reconstruction technique. CONTRAST:  129mL OMNIPAQUE IOHEXOL 300 MG/ML  SOLN COMPARISON:  06/17/2014 FINDINGS: Lower chest: No acute abnormality. Hepatobiliary: Mild fatty infiltration of the liver is noted. Gallbladder is within normal limits. Small 13 mm cyst is noted in the left lobe of the liver stable from the prior exam. Pancreas: Unremarkable. No pancreatic ductal dilatation or surrounding inflammatory changes. Spleen: Normal in size without focal abnormality. Adrenals/Urinary Tract: Adrenal glands are within normal limits. Kidneys demonstrate a normal enhancement pattern bilaterally. Small simple appearing cysts are noted bilaterally. No further follow-up is recommended. No renal calculi or obstructive changes are seen. The ureters are within normal limits. The bladder is partially  distended. Stomach/Bowel: No obstructive or inflammatory changes of the colon are seen. The appendix is within normal limits. Small bowel is within normal limits. In the distal stomach there is a small mucosal based density identified best seen on image number 32 of series 5 and image number 28 of series 2. This may simply represent some residual food although the possibility of a gastric lesion could not be totally excluded. Vascular/Lymphatic: Aortic atherosclerosis. No enlarged abdominal or pelvic lymph nodes. Reproductive: Prostate is unremarkable. Other: No abdominal wall hernia or abnormality. No abdominopelvic ascites. Musculoskeletal: No acute or significant osseous findings. IMPRESSION: 7 mm mucosal density in the distal stomach. The need for direct visualization can be determined on a clinical basis. No other acute abnormality is noted. Electronically Signed   By: Inez Catalina M.D.   On: 11/02/2022 21:39    Procedures Procedures    Medications Ordered in ED Medications  fentaNYL (SUBLIMAZE) injection 50 mcg (has no administration in time range)  sodium chloride 0.9 % bolus 1,000 mL (1,000 mLs Intravenous New Bag/Given 11/02/22 2154)  fentaNYL (SUBLIMAZE) injection 50 mcg (50 mcg Intravenous Given 11/02/22 2151)  LORazepam (ATIVAN) tablet 0.5 mg (0.5 mg Oral Given 11/02/22 2150)  iohexol (OMNIPAQUE) 300 MG/ML solution 100 mL (100 mLs Intravenous Contrast Given 11/02/22 2116)    ED Course/ Medical Decision Making/ A&P                             Medical Decision Making Amount and/or Complexity of Data Reviewed Labs: ordered. Radiology: ordered.  Risk Prescription drug management.   This patient presents to the ED with chief complaint(s) of lower left abdominal pain with pertinent past medical history of DM, HTN, GERD.  The complaint involves an extensive differential diagnosis and also carries with it a high risk of complications and morbidity.    The differential diagnosis includes  diverticulitis, gastroenteritis, colitis, constipation, IBD, IBS, appendicitis, bowel obstruction  The initial plan is to obtain abdominal pain workup including UA  Additional history obtained: Additional history obtained from  spouse.  Patient's spouse states that patient is sedentary during most of the day and is typically only up and moving around when he is going to the bathroom or getting up from mealtimes. Records reviewed  gastro records reviewed, patient had a colonoscopy done September 2023.  Patient had polyps and colonic lipomas.  Initial Assessment:   Exam significant for lower left quadrant abdominal tenderness with palpation.  Patient appears to be somewhat uncomfortable, but is not in acute distress.  No appreciable hernias.  Skin is warm and dry with good color.  Bowel sounds are normal.  Heart rate is normal in the 90s with regular rhythm.  Independent ECG/labs interpretation:  The following labs were independently interpreted:  CBC with mild leukocytosis and erythrocytosis.  Metabolic panel without major electrolyte derangement.  Glucose is mildly elevated at 124.  UA with glucosuria, but no other abnormal findings.  No evidence of UTI.  Independent visualization and interpretation of imaging: I independently visualized the following imaging with scope of interpretation limited to determining acute life threatening conditions related to emergency care: CT abdomen pelvis, which revealed mucosal density in the distal stomach which may be left over food, but gastric lesion cannot be ruled out.  There is no appendicitis, diverticulitis, or inflammatory changes to the bowel.  No explanation for patient's lower left quadrant pain.  I agree with radiologist interpretation.  Treatment and Reassessment: Patient pain was treated with fentanyl and he was given IV fluids.  He also regularly takes Ativan during the day and was given his home dose of evening Ativan.  Disposition:   I feel  that patient is appropriate for discharge home with gastroenterology follow-up.  Patient may require endoscopy if GI is concerned for mucosal density in the stomach.  Discussed importance of bowel regimen including stool softeners, laxatives, and increased fiber intake.  Also discussed importance of maintaining good hydration to help with passage of stool.  Suspect patient may be having discomfort related to recent constipation, but cannot completely rule out infection given the mild leukocytosis.  He does not have vomiting or diarrhea, which makes gastroenteritis less likely.  The patient has been appropriately medically screened and/or stabilized in the ED. I have low suspicion for any other emergent medical condition which would require further screening, evaluation or treatment in the ED or require inpatient management. At time of discharge the patient is hemodynamically stable and in no acute distress. I have discussed work-up results and diagnosis with patient and answered all questions. Patient is agreeable with discharge plan. We discussed strict return precautions for returning to the emergency department and they verbalized understanding.            Final Clinical Impression(s) / ED Diagnoses Final diagnoses:  Left lower quadrant abdominal pain  Constipation, unspecified constipation type    Rx / DC Orders ED Discharge Orders     None         Pat Kocher, [place] 11/02/22 2304    Lacretia Leigh, MD 11/04/22 1751

## 2022-11-02 NOTE — Discharge Instructions (Addendum)
Thank you for allow me be part of your care today.  Your workup was overall reassuring and there is no emergent finding that would require you to stay in the hospital.  Please call your gastroenterologist and schedule a follow-up appointment as soon as possible. It's important to avoid constipation, especially with the medications you take that can cause constipation.  Please increase your fiber and water intake.  I also recommend the use of stool softener such as Colace or MiraLAX.  Only use laxatives if you have not had a bowel movement in a few days despite use of stool softeners.  Return to the ED if you have worsening of your symptoms or if you have any new concerns.

## 2022-11-02 NOTE — ED Notes (Signed)
Pt's spouse was called to tell him pt will be getting discharged. Pt's spouse stated he is on his way in.

## 2022-11-02 NOTE — ED Triage Notes (Signed)
Patient is here for evaluation of left lower quadrant pain. Reports that his last bowel movement was yesterday. Denies any nausea or vomiting.

## 2022-11-03 ENCOUNTER — Telehealth: Payer: Self-pay | Admitting: Gastroenterology

## 2022-11-03 NOTE — Telephone Encounter (Signed)
Inbound call from patient spouse requesting to speak with a nurse regarding a sooner apt he was in ED yesterday and said that patient still have a severe abdominal pain.Marland KitchenMarland KitchenPlease advise

## 2022-11-03 NOTE — Telephone Encounter (Signed)
I am afraid I do not have a sooner appointment to offer him.  If we get a cancellation we will call him.  I reviewed the domino CT scan report.  No reported diverticulitis, blood clot or obstruction.  He has chronic constipation, and I suspect this may be causing his abdominal pain.  Recommendation:  Fleets enema x 2, followed by MiraLAX bowel purge  Please have them call us with an update after the results of that.   - HD

## 2022-11-03 NOTE — Telephone Encounter (Signed)
Pt seen in the ER yesterday for abd pain. Had CT scan done, was told to follow-up with GI. Pt requesting sooner appt than first available. First APP appt not until 3rd week in April. Please advise if you want to work him in sooner.

## 2022-11-04 NOTE — Telephone Encounter (Signed)
Spoke with pts husband and he is aware of recommendations per Dr. Loletha Carrow and knows to call with results.

## 2022-11-05 ENCOUNTER — Telehealth: Payer: Self-pay | Admitting: *Deleted

## 2022-11-05 NOTE — Telephone Encounter (Signed)
        Patient  visited [facility] on 11/03/2022  for treatment    Telephone encounter attempt :  1st  A HIPAA compliant voice message was left requesting a return call.  Instructed patient to call back at [phone].  [facility] [phone] [address] , [place] [address] Email : [email or web address]

## 2022-11-06 ENCOUNTER — Telehealth: Payer: Self-pay | Admitting: *Deleted

## 2022-11-06 NOTE — Telephone Encounter (Signed)
     atient  visited [facility] on 11/03/2022  for treatment      Telephone encounter attempt : 2nd   A HIPAA compliant voice message was left requesting a return call.  Instructed patient to call back at [phone].   [facility] [phone] [address] , [place] [address] Email : [email or web address]

## 2022-11-11 DIAGNOSIS — E119 Type 2 diabetes mellitus without complications: Secondary | ICD-10-CM | POA: Diagnosis not present

## 2022-11-11 DIAGNOSIS — Z961 Presence of intraocular lens: Secondary | ICD-10-CM | POA: Diagnosis not present

## 2022-11-13 ENCOUNTER — Other Ambulatory Visit: Payer: Self-pay

## 2022-11-13 ENCOUNTER — Other Ambulatory Visit: Payer: Self-pay | Admitting: Neurology

## 2022-11-13 DIAGNOSIS — E1142 Type 2 diabetes mellitus with diabetic polyneuropathy: Secondary | ICD-10-CM

## 2022-11-13 MED ORDER — TAPENTADOL HCL 50 MG PO TABS: 50.0000 mg | ORAL_TABLET | Freq: Three times a day (TID) | ORAL | 0 refills | Status: DC

## 2022-11-13 NOTE — Telephone Encounter (Signed)
Pt is requesting a refill for apentadol HCl (NUCYNTA) 75 MG tablet to.  Pharmacy: [facility] #[id number]

## 2022-11-13 NOTE — Telephone Encounter (Signed)
Pt last seen 06/30/22 and next f/u 12/08/22. Last refilled rx 10/18/22 #90.

## 2022-11-13 NOTE — Telephone Encounter (Signed)
Dr. [name]- please e-scribe

## 2022-11-14 NOTE — Telephone Encounter (Signed)
Pharmacy ([facility]) reports they will no be able to fill this Rx because it is a controlled substance, it needs to be filled by a local pharmacy.  [facility] is asking to be called back with name of local pharmacy pt would want to use.  Their call back # is [phone]

## 2022-11-17 MED ORDER — TAPENTADOL HCL 50 MG PO TABS: 50.0000 mg | ORAL_TABLET | Freq: Three times a day (TID) | ORAL | 0 refills | Status: DC

## 2022-11-17 NOTE — Addendum Note (Signed)
Addended by: Arther Abbott on: 11/17/2022 07:11 AM   Modules accepted: Orders

## 2022-12-04 NOTE — Patient Instructions (Signed)
Below is our plan:  We will continue current treatment plan. You can try capsaicin cream over the counter. Lidocaine cream can also help. Consider referral for Qutenza patches. There is evidence that this can help with neuropathy pain.   Please make sure you are staying well hydrated. I recommend 50-60 ounces daily. Well balanced diet and regular exercise encouraged. Consistent sleep schedule with 6-8 hours recommended.   Please continue follow up with care team as directed.   Follow up with Dr Epimenio Foot in 4-6 months   You may receive a survey regarding today's visit. I encourage you to leave honest feed back as I do use this information to improve patient care. Thank you for seeing me today!

## 2022-12-04 NOTE — Progress Notes (Signed)
PATIENT: Frank [name] DOB: November 21, 1955  REASON FOR VISIT: follow up HISTORY FROM: patient  Chief Complaint  Patient presents with   Follow-up    Pt in room 11. Here for Diabetic polyneuropathy associated with type 2 diabetes mellitus. Pt said neuropathy pain in feet, hand and legs are worsen. No recent falls. Pt no longer uses cpap.     HISTORY OF PRESENT ILLNESS:  12/08/22 ALL:  Frank [name] returns for follow up for chronic pain and diabetic neuropathy. He was last seen by Dr Epimenio Foot 06/2022. Nucynta was redecued to 50mg  TID for safety. Lyrica 200mg  BID, duloxetine 60mg  BID, Abilify 10mg  QD and cyclobenzaprine 5mg  QHS continued. He reports neuropathy pain has worsened on lower doses of Nucynta. He has more burning pain. Pain seems worse at night. No changes in gait. No assistive device, no falls. He does not sleep well. Unable to tolerate CPAP. PCP continues lorazepam and methylphenidate. He reports CBGs have been a little elevated. Last A1C was 7.6. Mood is stable.   06/30/2022: He feels he pain is doing about the same as at the last visit.   Pain is up to the knees in the legs and also in fingers.  Quality is burning. Pain is mostly from diabetic polyneuropathy.  He also has carpal tunnel syndrome.  He also feels his muscles are stiff, .   He is on Nucynta 75 mg tid, Lyrica 200 mg po bid and Cymbalta 60 mg po bid.  Nucynta sometimes causes mild sleepiness.   His PCP prescribes lorazepam 0.5 mg bid.   He is on methylphenidate ER 36 qAM. The NCCS database shows good compliance. He has a lot of numbness in his feet.   No sores.    No falls.   He needs to hold something if eyes are closed   He is sleeping well.   He has OSA (severe 69) but was unable to tolerate CPAP. He has no major weight change since last visit.   He wakes up a couple times a night due to nocturia but quickly falls back asleep..  We discussed he has severe OSA but did not tolerate CPAP and is not interested in surgery or  oral appliance.   He is on Ozempic but has not lost any weight  12/02/2021 ALL: He reports symptoms are stable. He continues duloxetine, Nucynta 75mg  TID (11/06/2021) and Lyrica 200mg  BID (last filled 10/07/2021). Pain is about the same. No significant changes since last visit. Abilify 10mg  daily and duloxetine 60mg  BID help with mood. PCP writes lorazepam 0.5mg  up to BID (usually takes 1mg  at bedtime). He has severe OSA but can not tolerate PAP therapy. He continues to follow up closely with PCP and endo. Last A1C was 7.2  04/09/2021 RS:  Frank [name] is a 67 year old man with a chronic peripheral neuropathy who had superimposed Guillain Barr syndrome in November 2015 and has had severe neuropathic pain   Update 03/11/2021: He feels he pain is doing about the same.    It is mostly legs, feet and hands.  Pain is mostly from diabetic polyneuropathy.  He also has carpal tunnel syndrome.  He also feels his muscles are stiff, .   He is on Nucynta 75 mg tid, Lyrica 200 mg po bid and Cymbalta 60 mg po bid.    His PCP prescribes lorazepam 0.5 mg bid.   He no longer takes methylphenidate ER 54 qAM    The NCCS database shows good compliance.  He has a lot of numbness in his feet.   No sores.    No falls.   He needs to hold something if eyes are closed   He is sleeping well.   He has OSA (severe 69) but was unable to tolerate CPAP. He has no major weight change since last visit.   He wakes up a couple times a night due to nocturia but quickly falls back asleep..  We discussed he has severe OSA but did not tolerate CPAP and is not interested in surgery or oral appliance.      EPWORTH SLEEPINESS SCALE 03/11/2021   On a scale of 0 - 3 what is the chance of dozing:   Sitting and Reading:                           1 Watching TV:                                      3 Sitting inactive in a public place:        2 Passenger in car for one hour:           3 Lying down to rest in the afternoon:   3 Sitting and  talking to someone:          0 Sitting quietly after lunch:                   3 In a car, stopped in traffic:                  0  Does not drive   Total (out of 24):   15  /24 moderate ESS    B12 was low 2021 but he never supplemented.       GBS History:   The initial episode of GBS was treated with plasmapheresis. He reports that the diagnosis took a couple weeks and he had several emergency room visits. Initially he was sent to a skilled nursing facility. He was reporting a lot of back pain alongside the weakness. A lumbar puncture was eventually done showing high protein which led to the dye diagnosis of GBS.  He had severe weakness at the peak and required intubation for respiratory support. At that time, he was unable to move his legs and could barely move his arms. He improved quite a bit after plasmapheresis and was discharged. However, a couple days after discharge he began to feel weak again and return to the emergency room. He was recently admitted for recurrent GBS. For that hospital stay he did not require intubation but did have a facemask oxygen. He also was treated with IVIG the second time around. He improved and was discharged home. He did outpatient physical therapy. He has had a fairly good in movement of his strength and is able to walk independently. However, he has had more dysesthetic pain.   With the exception of more pain, he otherwise feels very close to his pre-GBS baseline.   Of note, he had a flu shot a few weeks before the first episode of GBS.   MRI's from 06/2014 and 07/2014:    Cervical MRI shows spinal stenosis at C4-C5, C5-C6 and C6-C7. There is no spinal cord compression. Thoracic spine shows T8-T9 disc extrusion to the left that does not cause spinal cord compression. There are  mild degenerative changes in the lumbar spine. Contrast was not used.   I also reviewed many of the notes and labs from his hospital stay. CSF Protein was greatly elevated at 118 and the CSF  white blood cell count was 1.  REVIEW OF SYSTEMS: Out of a complete 14 system review of symptoms, the patient complains only of the following symptoms, neuropathy, chronic pain, insomnia and all other reviewed systems are negative.  ALLERGIES: Allergies  Allergen Reactions   Codeine Itching    REACTION: Itching   Influenza Vaccines     Due to hx of GBS   Metformin And Related Other (See Comments)    GI upset, diarrhea   Other Other (See Comments)    Due to hx of GBS- vaccines    Shellfish Allergy     HOME MEDICATIONS: Outpatient Medications Prior to Visit  Medication Sig Dispense Refill   Accu-Chek Softclix Lancets lancets Use to check blood sugars daily 100 each 11   ARIPiprazole (ABILIFY) 10 MG tablet TAKE 1 TABLET EVERY DAY 90 tablet 3   Continuous Blood Gluc Sensor (DEXCOM G7 SENSOR) MISC Change every 10 days 3 each 2   cyclobenzaprine (FLEXERIL) 5 MG tablet TAKE 1 TABLET AT BEDTIME 90 tablet 0   dapagliflozin propanediol (FARXIGA) 10 MG TABS tablet Take 1 tablet (10 mg total) by mouth daily before breakfast. 90 tablet 3   DULoxetine (CYMBALTA) 60 MG capsule Take 1 capsule (60 mg total) by mouth 2 (two) times daily. 180 capsule 3   glipiZIDE (GLUCOTROL XL) 2.5 MG 24 hr tablet Take 1 tablet (2.5 mg total) by mouth daily with breakfast. 90 tablet 3   glucose blood (ACCU-CHEK GUIDE) test strip Use to check blood sugars daily 100 strip 11   hydrochlorothiazide (MICROZIDE) 12.5 MG capsule TAKE 1 CAPSULE(12.5 MG) BY MOUTH DAILY 90 capsule 3   insulin degludec (TRESIBA FLEXTOUCH) 200 UNIT/ML FlexTouch Pen Inject 74 Units into the skin daily. 42 mL 3   Insulin Pen Needle (B-D ULTRAFINE III SHORT PEN) 31G X 8 MM MISC USE AS DIRECTED EVERY DAY WITH TRESIBA PENS 100 each 2   LORazepam (ATIVAN) 0.5 MG tablet TAKE 1 TABLET BY MOUTH TWICE DAILY AS NEEDED 60 tablet 5   LORazepam (ATIVAN) 1 MG tablet TAKE 1 TABLET BY MOUTH TWICE A DAY AS NEEDED 60 tablet 5   methylphenidate (CONCERTA) 36 MG PO  CR tablet Take 1 tablet (36 mg total) by mouth daily. 30 tablet 0   omeprazole (PRILOSEC OTC) 20 MG tablet Take 20 mg by mouth daily.     rosuvastatin (CRESTOR) 20 MG tablet 1 tab by mouth once daily 90 tablet 3   Semaglutide, 2 MG/DOSE, (OZEMPIC, 2 MG/DOSE,) 8 MG/3ML SOPN Inject 2 mg into the skin once a week. 9 mL 3   tapentadol (NUCYNTA) 50 MG tablet Take 1 tablet (50 mg total) by mouth 3 (three) times daily. 90 tablet 0   pregabalin (LYRICA) 200 MG capsule TAKE (1) CAPSULE TWO TIMES DAILY. 180 capsule 1   Facility-Administered Medications Prior to Visit  Medication Dose Route Frequency Provider Last Rate Last Admin   dextrose 5 % solution   Intravenous Once Danis, Starr Lake III, MD       dextrose 50 % solution 25 mL  25 mL Intravenous Once Sherrilyn Rist, MD        PAST MEDICAL HISTORY: Past Medical History:  Diagnosis Date   Abdominal pain, left lower quadrant 06/06/2010   Abdominal pain,  unspecified site 01/19/2009   ADD 10/09/2008   ALLERGIC RHINITIS 10/09/2008   Allergy    ANXIETY DEPRESSION 02/01/2008   Arthritis    hands ?   Blood transfusion without reported diagnosis    Cataract    removed both eyes    COLONIC POLYPS 02/01/2008   DIABETES MELLITUS, TYPE II 05/20/2010   DYSPNEA 03/12/2010   ERECTILE DYSFUNCTION 10/09/2008   ERECTILE DYSFUNCTION, ORGANIC 05/20/2010   FOOT PAIN, LEFT 05/20/2010   GERD 02/01/2008   Guillain Barr syndrome (HCC)    HEMORRHOIDS 02/01/2008   HIATAL HERNIA 02/01/2008   HYPERLIPIDEMIA 10/09/2008   HYPERTENSION 10/09/2008   MORTON'S NEUROMA 05/20/2010   Other specified forms of hearing loss 06/27/2009   PERIPHERAL EDEMA 05/20/2010   PERIPHERAL NEUROPATHY 05/20/2010   Sleep apnea    stopped cpap    SLEEP APNEA, OBSTRUCTIVE 02/01/2008   Stricture and stenosis of esophagus 02/02/2008   Type II or unspecified type diabetes mellitus without mention of complication, uncontrolled 11/14/2010   WRIST PAIN, LEFT 12/05/2009    PAST SURGICAL HISTORY: Past  Surgical History:  Procedure Laterality Date   CARPAL TUNNEL RELEASE Bilateral    COLONOSCOPY     DENTAL SURGERY     ESOPHAGEAL DILATION  july 2009   ESOPHAGOGASTRODUODENOSCOPY N/A 06/27/2014   Procedure: ESOPHAGOGASTRODUODENOSCOPY (EGD);  Surgeon: Hart Carwin, MD;  Location: [facility];  Service: Endoscopy;  Laterality: N/A;   EYE SURGERY     catract surgery on both eyes   POLYPECTOMY     ROTATOR CUFF REPAIR     UPPER GASTROINTESTINAL ENDOSCOPY      FAMILY HISTORY: Family History  Problem Relation Age of Onset   Diabetes Mother    Heart disease Mother    Hyperlipidemia Mother    Depression Mother    Diabetes Brother    Colon cancer Neg Hx    Colon polyps Neg Hx    Esophageal cancer Neg Hx    Rectal cancer Neg Hx    Stomach cancer Neg Hx     SOCIAL HISTORY: Social History   Socioeconomic History   Marital status: Single    Spouse name: Not on file   Number of children: Not on file   Years of education: Not on file   Highest education level: Not on file  Occupational History   Occupation: Housekeeper [employer]    Employer: [employer]  Tobacco Use   Smoking status: Never   Smokeless tobacco: Never  Vaping Use   Vaping Use: Never used  Substance and Sexual Activity   Alcohol use: Yes    Comment: occasional   Drug use: Yes    Types: Methaqualone   Sexual activity: Yes    Comment: before I got sick  Other Topics Concern   Not on file  Social History Narrative   Not on file   Social Determinants of Health   Financial Resource Strain: Not on file  Food Insecurity: Not on file  Transportation Needs: Not on file  Physical Activity: Not on file  Stress: Not on file  Social Connections: Not on file  Intimate Partner Violence: Not on file      PHYSICAL EXAM  Vitals:   12/08/22 1006  BP: 106/62  Pulse: 72  Weight: 219 lb 3.2 oz (99.4 kg)  Height: 5\' 9"  (1.753 m)    Body mass index is 32.37 kg/m.  Generalized: Well developed, in no acute  distress  Cardiology: normal rate and rhythm, no murmur noted Neurological examination  Mentation: Alert oriented to time, place, history taking. Follows all commands speech and language fluent Cranial nerve II-XII: Pupils were equal round reactive to light. Extraocular movements were full, visual field were full on confrontational test. Facial sensation and strength were normal. Uvula tongue midline. Head turning and shoulder shrug  were normal and symmetric. Motor: The motor testing reveals 5 over 5 strength of all 4 extremities. Good symmetric motor tone is noted throughout.  Sensory: Sensory testing is reduced in bilateral lower extremities from foot to knee bilaterally, normal in upper extremities, decreased vibratory sensation at left knee and right ankle. He reports no sensation from 1 inch below patella down to pin prick testing. No skin lesions noted.  Coordination: Cerebellar testing reveals good finger-nose-finger and heel-to-shin bilaterally.  Gait and station: Gait is short. Decreased arm swing. Mildly stooped posture.Tandem not attempted. Romberg is positive.   Reflexes: Deep tendon reflexes are symmetric and normal bilaterally.   DIAGNOSTIC DATA (LABS, IMAGING, TESTING) - I reviewed patient records, labs, notes, testing and imaging myself where available.      No data to display           Lab Results  Component Value Date   WBC 11.8 (H) 11/02/2022   HGB 17.4 (H) 11/02/2022   HCT 52.1 (H) 11/02/2022   MCV 85.3 11/02/2022   PLT 187 11/02/2022      Component Value Date/Time   NA 136 11/02/2022 1856   K 3.8 11/02/2022 1856   CL 99 11/02/2022 1856   CO2 26 11/02/2022 1856   GLUCOSE 124 (H) 11/02/2022 1856   BUN 16 11/02/2022 1856   CREATININE 1.12 11/02/2022 1856   CALCIUM 9.4 11/02/2022 1856   PROT 8.3 (H) 11/02/2022 1856   PROT 7.0 08/30/2015 1157   ALBUMIN 4.6 11/02/2022 1856   AST 14 (L) 11/02/2022 1856   ALT 15 11/02/2022 1856   ALKPHOS 77 11/02/2022 1856    BILITOT 1.3 (H) 11/02/2022 1856   GFRNONAA >60 11/02/2022 1856   GFRAA >90 08/06/2014 0240   Lab Results  Component Value Date   CHOL 117 09/08/2022   HDL 44.20 09/08/2022   LDLCALC 38 09/08/2022   LDLDIRECT 130.0 03/03/2022   TRIG 177.0 (H) 09/08/2022   CHOLHDL 3 09/08/2022   Lab Results  Component Value Date   HGBA1C 7.6 (H) 09/12/2022   Lab Results  Component Value Date   VITAMINB12 197 (L) 09/08/2022   Lab Results  Component Value Date   TSH 1.82 09/08/2022       ASSESSMENT AND PLAN 68 y.o. year old male  has a past medical history of Abdominal pain, left lower quadrant (06/06/2010), Abdominal pain, unspecified site (01/19/2009), ADD (10/09/2008), ALLERGIC RHINITIS (10/09/2008), Allergy, ANXIETY DEPRESSION (02/01/2008), Arthritis, Blood transfusion without reported diagnosis, Cataract, COLONIC POLYPS (02/01/2008), DIABETES MELLITUS, TYPE II (05/20/2010), DYSPNEA (03/12/2010), ERECTILE DYSFUNCTION (10/09/2008), ERECTILE DYSFUNCTION, ORGANIC (05/20/2010), FOOT PAIN, LEFT (05/20/2010), GERD (02/01/2008), Guillain Barr syndrome (HCC), HEMORRHOIDS (02/01/2008), HIATAL HERNIA (02/01/2008), HYPERLIPIDEMIA (10/09/2008), HYPERTENSION (10/09/2008), MORTON'S NEUROMA (05/20/2010), Other specified forms of hearing loss (06/27/2009), PERIPHERAL EDEMA (05/20/2010), PERIPHERAL NEUROPATHY (05/20/2010), Sleep apnea, SLEEP APNEA, OBSTRUCTIVE (02/01/2008), Stricture and stenosis of esophagus (02/02/2008), Type II or unspecified type diabetes mellitus without mention of complication, uncontrolled (11/14/2010), and WRIST PAIN, LEFT (12/05/2009). here with     ICD-10-CM   1. Diabetic polyneuropathy associated with type 2 diabetes mellitus (HCC)  E11.42     2. Chronic pain syndrome  G89.4     3. Obstructive sleep apnea  G47.33  4. Dysesthesia  R20.8       Deivi feels that neuropathy pain has worsened since reducing Nucynta to 50mg  TID. A1C was elevated from previous follow up. We have discussed correlation and  need for tight diabetic control. I have offered referral to Dr Carlis Abbott for Qutenza eval. He wishes to hold off for now. May consider capsaicin OTC. Lidocaine could also be helpful. We will continue Abilify 10mg  daily, duloxetine 60 mg twice daily, Lyrica 200 mg twice daily and Nucynta 50 mg 3 times daily. PDMP shows appropriate refills. We have reviewed common side effects of these medications, specifically, respiratory depression. We have reviewed OSA diagnosis but he is adamant he can not tolerate PAP therapy. He will continue close follow-up with his primary care provider and endocrinology for management of diabetes and other comorbidities.  Adequate hydration, well-balanced diet and regular exercise advised.  He will follow up with Dr. Epimenio Foot in 4-6 months, sooner if needed.  He verbalizes understanding and agreement with this plan.   No orders of the defined types were placed in this encounter.    Meds ordered this encounter  Medications   pregabalin (LYRICA) 200 MG capsule    Sig: TAKE (1) CAPSULE TWO TIMES DAILY.    Dispense:  180 capsule    Refill:  1    [place] drug registry checked    Order Specific Question:   Supervising Provider    Answer:   Anson Fret [[id number]]     YNW GNFAO, FNP-C 12/08/2022, 11:34 AM [facility] [address] [place], [place] [address] [phone]

## 2022-12-08 ENCOUNTER — Encounter: Payer: Self-pay | Admitting: Family Medicine

## 2022-12-08 ENCOUNTER — Ambulatory Visit: Payer: Medicare PPO | Admitting: Family Medicine

## 2022-12-08 VITALS — BP 106/62 | HR 72 | Ht 69.0 in | Wt 219.2 lb

## 2022-12-08 DIAGNOSIS — R208 Other disturbances of skin sensation: Secondary | ICD-10-CM

## 2022-12-08 DIAGNOSIS — G894 Chronic pain syndrome: Secondary | ICD-10-CM

## 2022-12-08 DIAGNOSIS — G4733 Obstructive sleep apnea (adult) (pediatric): Secondary | ICD-10-CM | POA: Diagnosis not present

## 2022-12-08 DIAGNOSIS — E1142 Type 2 diabetes mellitus with diabetic polyneuropathy: Secondary | ICD-10-CM

## 2022-12-08 MED ORDER — PREGABALIN 200 MG PO CAPS: ORAL_CAPSULE | ORAL | 1 refills | Status: DC

## 2022-12-15 ENCOUNTER — Other Ambulatory Visit: Payer: Self-pay | Admitting: Family Medicine

## 2022-12-15 DIAGNOSIS — E1142 Type 2 diabetes mellitus with diabetic polyneuropathy: Secondary | ICD-10-CM

## 2022-12-15 MED ORDER — TAPENTADOL HCL 50 MG PO TABS: 50.0000 mg | ORAL_TABLET | Freq: Three times a day (TID) | ORAL | 0 refills | Status: DC

## 2022-12-15 NOTE — Telephone Encounter (Signed)
Pt is needing a refill request for his  tapentadol (NUCYNTA) 50 MG tablet sent in to the [facility] on [address]

## 2022-12-15 NOTE — Telephone Encounter (Signed)
Last seen 12/08/22 and next f/u 06/10/23. Per drug registry, last refilled 11/17/22 #90

## 2022-12-22 DIAGNOSIS — E1165 Type 2 diabetes mellitus with hyperglycemia: Secondary | ICD-10-CM | POA: Diagnosis not present

## 2022-12-27 ENCOUNTER — Other Ambulatory Visit: Payer: Self-pay | Admitting: Internal Medicine

## 2023-01-09 ENCOUNTER — Ambulatory Visit: Payer: Medicare PPO | Admitting: Gastroenterology

## 2023-01-12 ENCOUNTER — Other Ambulatory Visit: Payer: Self-pay | Admitting: Family Medicine

## 2023-01-12 DIAGNOSIS — E1142 Type 2 diabetes mellitus with diabetic polyneuropathy: Secondary | ICD-10-CM

## 2023-01-12 MED ORDER — TAPENTADOL HCL 50 MG PO TABS: 50.0000 mg | ORAL_TABLET | Freq: Three times a day (TID) | ORAL | 0 refills | Status: DC

## 2023-01-12 NOTE — Telephone Encounter (Signed)
Last seen 12/08/22 and next f/u 06/10/23. Last refilled 12/15/22 #90.

## 2023-01-12 NOTE — Telephone Encounter (Signed)
Pt requesting refill on tapentadol (NUCYNTA) 50 MG tablet. Should be [facility] to [id number].

## 2023-01-23 ENCOUNTER — Other Ambulatory Visit: Payer: Medicare PPO

## 2023-01-26 ENCOUNTER — Other Ambulatory Visit: Payer: Self-pay | Admitting: Internal Medicine

## 2023-01-28 ENCOUNTER — Encounter: Payer: Self-pay | Admitting: "Endocrinology

## 2023-01-28 ENCOUNTER — Ambulatory Visit: Payer: Medicare PPO | Admitting: "Endocrinology

## 2023-01-28 VITALS — BP 129/69 | HR 123 | Ht 69.0 in | Wt 217.6 lb

## 2023-01-28 DIAGNOSIS — E1140 Type 2 diabetes mellitus with diabetic neuropathy, unspecified: Secondary | ICD-10-CM

## 2023-01-28 DIAGNOSIS — Z794 Long term (current) use of insulin: Secondary | ICD-10-CM

## 2023-01-28 DIAGNOSIS — Z7984 Long term (current) use of oral hypoglycemic drugs: Secondary | ICD-10-CM

## 2023-01-28 DIAGNOSIS — E782 Mixed hyperlipidemia: Secondary | ICD-10-CM

## 2023-01-28 DIAGNOSIS — Z7985 Long-term (current) use of injectable non-insulin antidiabetic drugs: Secondary | ICD-10-CM

## 2023-01-28 LAB — POCT GLYCOSYLATED HEMOGLOBIN (HGB A1C): Hemoglobin A1C: 6.5 % — AB (ref 4.0–5.6)

## 2023-01-28 NOTE — Progress Notes (Signed)
Outpatient Endocrinology Note [name], MD  01/28/23   Frank [name] [birth year] [id number]  Referring Provider: Corwin Levins, MD Primary Care Provider: Corwin Levins, MD Reason for consultation: Subjective   Assessment & Plan  Frank [name] was seen today for diabetes.  Diagnoses and all orders for this visit:  Type 2 diabetes mellitus with diabetic neuropathy, with long-term current use of insulin (HCC) -     POCT glycosylated hemoglobin (Hb A1C)  Long term (current) use of oral hypoglycemic drugs  Long-term (current) use of injectable non-insulin antidiabetic drugs  Mixed hypercholesterolemia and hypertriglyceridemia    Diabetes Type 2 complicated by neuropathy,  Lab Results  Component Value Date   GFR 67.96 09/12/2022   Hba1c goal less than 7, current Hba1c is  Lab Results  Component Value Date   HGBA1C 6.5 (A) 01/28/2023   Will recommend the following: Farxiga 10 mg daily, Glipizide XL 2.5 mg every day Ozempic 2 mg weekly Tresiba 70 units daily           Side effects from medications: Diarrhea from metformin ER  No known contraindications to any of above medications  -Last LD and Tg are as follows: Lab Results  Component Value Date   LDLCALC 38 09/08/2022    Lab Results  Component Value Date   TRIG 177.0 (H) 09/08/2022   -Recommend rosuvastatin 20 mg QD -Follow low fat diet and exercise   -Blood pressure goal <140/90 - Microalbumin/creatinine goal < 30 -Last MA/Cr is as follows: Lab Results  Component Value Date   MICROALBUR 0.8 09/08/2022   -not on ACE/ARB  -diet changes including salt restriction -limit eating outside -counseled BP targets per standards of diabetes care -uncontrolled blood pressure can lead to retinopathy, nephropathy and cardiovascular and atherosclerotic heart disease  Reviewed and counseled on: -A1C target -Blood sugar targets -Complications of uncontrolled diabetes  -Checking blood sugar before meals  and bedtime and bring log next visit -All medications with mechanism of action and side effects -Hypoglycemia management: rule of 15's, Glucagon Emergency Kit and medical alert ID -low-carb low-fat plate-method diet -At least 20 minutes of physical activity per day -Annual dilated retinal eye exam and foot exam -compliance and follow up needs -follow up as scheduled or earlier if problem gets worse  Call if blood sugar is less than 70 or consistently above 250    Take a 15 gm snack of carbohydrate at bedtime before you go to sleep if your blood sugar is less than 100.    If you are going to fast after midnight for a test or procedure, ask your physician for instructions on how to reduce/decrease your insulin dose.    Call if blood sugar is less than 70 or consistently above 250  -Treating a low sugar by rule of 15  (15 gms of sugar every 15 min until sugar is more than 70) If you feel your sugar is low, test your sugar to be sure If your sugar is low (less than 70), then take 15 grams of a fast acting Carbohydrate (3-4 glucose tablets or glucose gel or 4 ounces of juice or regular soda) Recheck your sugar 15 min after treating low to make sure it is more than 70 If sugar is still less than 70, treat again with 15 grams of carbohydrate          Don't drive the hour of hypoglycemia  If unconscious/unable to eat or drink by mouth, use glucagon injection or nasal  spray baqsimi and call 911. Can repeat again in 15 min if still unconscious.  Return in about 4 months (around 05/30/2023).   I have reviewed current medications, nurse's notes, allergies, vital signs, past medical and surgical history, family medical history, and social history for this encounter. Counseled patient on symptoms, examination findings, lab findings, imaging results, treatment decisions and monitoring and prognosis. The patient understood the recommendations and agrees with the treatment plan. All questions regarding  treatment plan were fully answered.  [name], MD  01/28/23    History of Present Illness Frank [name] is a 67 y.o. year old male who presents for evaluation of Type 2 diabetes mellitus.  Trandon Longtin was first diagnosed in 2012.   Diabetes education +  Home diabetes regimen: Non-insulin hypoglycemic drugs: Farxiga 10 mg daily, Ozempic 2 mg weekly     Insulin regimen: Tresiba 70 units daily           Side effects from medications: Diarrhea from metformin ER  Previous history:  His A1c has ranged between 7 and 10.8 since 2018 Farxiga was started in 2022, was on Ozempic since 7/22, previously he took Trulicity for a year  COMPLICATIONS -  MI/Stroke -  retinopathy +  neuropathy -  nephropathy  BLOOD SUGAR DATA  CGM interpretation: At today's visit, we reviewed her CGM downloads. The full report is scanned in the media. Reviewing the CGM trends, BG are well controlled throughout the day.    Physical Exam  BP 129/69   Pulse (!) 123   Ht 5\' 9"  (1.753 m)   Wt 217 lb 9.6 oz (98.7 kg)   SpO2 95%   BMI 32.13 kg/m    Constitutional: well developed, well nourished Head: normocephalic, atraumatic Eyes: sclera anicteric, no redness Neck: supple Lungs: normal respiratory effort Neurology: alert and oriented Skin: dry, no appreciable rashes Musculoskeletal: no appreciable defects Psychiatric: normal mood and affect Diabetic Foot Exam - Simple   No data filed      Current Medications Patient's Medications  New Prescriptions   No medications on file  Previous Medications   ACCU-CHEK SOFTCLIX LANCETS LANCETS    Use to check blood sugars daily   ARIPIPRAZOLE (ABILIFY) 10 MG TABLET    TAKE 1 TABLET EVERY DAY   CONTINUOUS BLOOD GLUC SENSOR (DEXCOM G7 SENSOR) MISC    Change every 10 days   CYCLOBENZAPRINE (FLEXERIL) 5 MG TABLET    TAKE 1 TABLET AT BEDTIME   DAPAGLIFLOZIN PROPANEDIOL (FARXIGA) 10 MG TABS TABLET    Take 1 tablet (10 mg total) by mouth  daily before breakfast.   DULOXETINE (CYMBALTA) 60 MG CAPSULE    Take 1 capsule (60 mg total) by mouth 2 (two) times daily.   GLIPIZIDE (GLUCOTROL XL) 2.5 MG 24 HR TABLET    Take 1 tablet (2.5 mg total) by mouth daily with breakfast.   GLUCOSE BLOOD (ACCU-CHEK GUIDE) TEST STRIP    Use to check blood sugars daily   HYDROCHLOROTHIAZIDE (MICROZIDE) 12.5 MG CAPSULE    TAKE 1 CAPSULE(12.5 MG) BY MOUTH DAILY   INSULIN DEGLUDEC (TRESIBA FLEXTOUCH) 200 UNIT/ML FLEXTOUCH PEN    Inject 74 Units into the skin daily.   INSULIN PEN NEEDLE (B-D ULTRAFINE III SHORT PEN) 31G X 8 MM MISC    USE AS DIRECTED EVERY DAY WITH TRESIBA PENS   LORAZEPAM (ATIVAN) 0.5 MG TABLET    TAKE 1 TABLET BY MOUTH TWICE DAILY AS NEEDED   LORAZEPAM (ATIVAN) 1 MG TABLET  TAKE 1 TABLET BY MOUTH TWICE A DAY AS NEEDED   METHYLPHENIDATE (CONCERTA) 36 MG PO CR TABLET    Take 1 tablet (36 mg total) by mouth daily.   OMEPRAZOLE (PRILOSEC OTC) 20 MG TABLET    Take 20 mg by mouth daily.   PREGABALIN (LYRICA) 200 MG CAPSULE    TAKE (1) CAPSULE TWO TIMES DAILY.   ROSUVASTATIN (CRESTOR) 20 MG TABLET    1 tab by mouth once daily   SEMAGLUTIDE, 2 MG/DOSE, (OZEMPIC, 2 MG/DOSE,) 8 MG/3ML SOPN    Inject 2 mg into the skin once a week.   TAPENTADOL (NUCYNTA) 50 MG TABLET    Take 1 tablet (50 mg total) by mouth 3 (three) times daily.  Modified Medications   No medications on file  Discontinued Medications   No medications on file    Allergies Allergies  Allergen Reactions   Codeine Itching    REACTION: Itching   Influenza Vaccines     Due to hx of GBS   Metformin And Related Other (See Comments)    GI upset, diarrhea   Other Other (See Comments)    Due to hx of GBS- vaccines    Shellfish Allergy     Past Medical History Past Medical History:  Diagnosis Date   Abdominal pain, left lower quadrant 06/06/2010   Abdominal pain, unspecified site 01/19/2009   ADD 10/09/2008   ALLERGIC RHINITIS 10/09/2008   Allergy    ANXIETY DEPRESSION  02/01/2008   Arthritis    hands ?   Blood transfusion without reported diagnosis    Cataract    removed both eyes    COLONIC POLYPS 02/01/2008   DIABETES MELLITUS, TYPE II 05/20/2010   DYSPNEA 03/12/2010   ERECTILE DYSFUNCTION 10/09/2008   ERECTILE DYSFUNCTION, ORGANIC 05/20/2010   FOOT PAIN, LEFT 05/20/2010   GERD 02/01/2008   Guillain Barr syndrome (HCC)    HEMORRHOIDS 02/01/2008   HIATAL HERNIA 02/01/2008   HYPERLIPIDEMIA 10/09/2008   HYPERTENSION 10/09/2008   MORTON'S NEUROMA 05/20/2010   Other specified forms of hearing loss 06/27/2009   PERIPHERAL EDEMA 05/20/2010   PERIPHERAL NEUROPATHY 05/20/2010   Sleep apnea    stopped cpap    SLEEP APNEA, OBSTRUCTIVE 02/01/2008   Stricture and stenosis of esophagus 02/02/2008   Type II or unspecified type diabetes mellitus without mention of complication, uncontrolled 11/14/2010   WRIST PAIN, LEFT 12/05/2009    Past Surgical History Past Surgical History:  Procedure Laterality Date   CARPAL TUNNEL RELEASE Bilateral    COLONOSCOPY     DENTAL SURGERY     ESOPHAGEAL DILATION  july 2009   ESOPHAGOGASTRODUODENOSCOPY N/A 06/27/2014   Procedure: ESOPHAGOGASTRODUODENOSCOPY (EGD);  Surgeon: Hart Carwin, MD;  Location: [facility];  Service: Endoscopy;  Laterality: N/A;   EYE SURGERY     catract surgery on both eyes   POLYPECTOMY     ROTATOR CUFF REPAIR     UPPER GASTROINTESTINAL ENDOSCOPY      Family History family history includes Depression in his mother; Diabetes in his brother and mother; Heart disease in his mother; Hyperlipidemia in his mother.  Social History Social History   Socioeconomic History   Marital status: Single    Spouse name: Not on file   Number of children: Not on file   Years of education: Not on file   Highest education level: Not on file  Occupational History   Occupation: Housekeeper [employer]    Employer: [employer]  Tobacco Use   Smoking status:  Never   Smokeless tobacco: Never  Vaping Use   Vaping Use:  Never used  Substance and Sexual Activity   Alcohol use: Yes    Comment: occasional   Drug use: Yes    Types: Methaqualone   Sexual activity: Yes    Comment: before I got sick  Other Topics Concern   Not on file  Social History Narrative   Not on file   Social Determinants of Health   Financial Resource Strain: Not on file  Food Insecurity: Not on file  Transportation Needs: Not on file  Physical Activity: Not on file  Stress: Not on file  Social Connections: Not on file  Intimate Partner Violence: Not on file    Lab Results  Component Value Date   HGBA1C 6.5 (A) 01/28/2023   HGBA1C 7.6 (H) 09/12/2022   HGBA1C 7.7 (H) 09/08/2022   Lab Results  Component Value Date   CHOL 117 09/08/2022   Lab Results  Component Value Date   HDL 44.20 09/08/2022   Lab Results  Component Value Date   LDLCALC 38 09/08/2022   Lab Results  Component Value Date   TRIG 177.0 (H) 09/08/2022   Lab Results  Component Value Date   CHOLHDL 3 09/08/2022   Lab Results  Component Value Date   CREATININE 1.12 11/02/2022   Lab Results  Component Value Date   GFR 67.96 09/12/2022   Lab Results  Component Value Date   MICROALBUR 0.8 09/08/2022      Component Value Date/Time   NA 136 11/02/2022 1856   K 3.8 11/02/2022 1856   CL 99 11/02/2022 1856   CO2 26 11/02/2022 1856   GLUCOSE 124 (H) 11/02/2022 1856   BUN 16 11/02/2022 1856   CREATININE 1.12 11/02/2022 1856   CALCIUM 9.4 11/02/2022 1856   PROT 8.3 (H) 11/02/2022 1856   PROT 7.0 08/30/2015 1157   ALBUMIN 4.6 11/02/2022 1856   AST 14 (L) 11/02/2022 1856   ALT 15 11/02/2022 1856   ALKPHOS 77 11/02/2022 1856   BILITOT 1.3 (H) 11/02/2022 1856   GFRNONAA >60 11/02/2022 1856   GFRAA >90 08/06/2014 0240      Latest Ref Rng & Units 11/02/2022    6:56 PM 09/12/2022   10:15 AM 09/08/2022   11:49 AM  BMP  Glucose 70 - 99 mg/dL 578  469  629   BUN 8 - 23 mg/dL 16  19  19    Creatinine 0.61 - 1.24 mg/dL 5.28  4.13  2.44    Sodium 135 - 145 mmol/L 136  138  140   Potassium 3.5 - 5.1 mmol/L 3.8  4.2  4.1   Chloride 98 - 111 mmol/L 99  98  100   CO2 22 - 32 mmol/L 26  32  30   Calcium 8.9 - 10.3 mg/dL 9.4  9.8  9.9        Component Value Date/Time   WBC 11.8 (H) 11/02/2022 1856   RBC 6.11 (H) 11/02/2022 1856   HGB 17.4 (H) 11/02/2022 1856   HCT 52.1 (H) 11/02/2022 1856   PLT 187 11/02/2022 1856   MCV 85.3 11/02/2022 1856   MCV 93.0 12/30/2013 1537   MCH 28.5 11/02/2022 1856   MCHC 33.4 11/02/2022 1856   RDW 14.3 11/02/2022 1856   LYMPHSABS 2.9 09/08/2022 1149   MONOABS 0.6 09/08/2022 1149   EOSABS 0.1 09/08/2022 1149   BASOSABS 0.1 09/08/2022 1149     Parts of this note may have  been dictated using voice recognition software. There may be variances in spelling and vocabulary which are unintentional. Not all errors are proofread. Please notify the Thereasa Parkin if any discrepancies are noted or if the meaning of any statement is not clear.

## 2023-02-09 ENCOUNTER — Other Ambulatory Visit: Payer: Self-pay | Admitting: Family Medicine

## 2023-02-09 DIAGNOSIS — E1142 Type 2 diabetes mellitus with diabetic polyneuropathy: Secondary | ICD-10-CM

## 2023-02-09 MED ORDER — TAPENTADOL HCL 50 MG PO TABS: 50.0000 mg | ORAL_TABLET | Freq: Three times a day (TID) | ORAL | 0 refills | Status: DC

## 2023-02-09 NOTE — Telephone Encounter (Signed)
Pt last seen on 12/08/22 per note "We will continue Abilify 10mg  daily, duloxetine 60 mg twice daily, Lyrica 200 mg twice daily and Nucynta 50 mg 3 times daily. " Follow up scheduled on 06/10/23 Last filled on 01/12/23 #90 tablets (30 day supply) Rx pending to be signed

## 2023-02-09 NOTE — Telephone Encounter (Signed)
Pt request refill for tapentadol (NUCYNTA) 50 MG tablet  sent to [facility] [phone]

## 2023-02-11 ENCOUNTER — Other Ambulatory Visit: Payer: Self-pay | Admitting: Endocrinology

## 2023-02-11 DIAGNOSIS — E0840 Diabetes mellitus due to underlying condition with diabetic neuropathy, unspecified: Secondary | ICD-10-CM

## 2023-02-17 ENCOUNTER — Other Ambulatory Visit: Payer: Self-pay | Admitting: Neurology

## 2023-02-17 ENCOUNTER — Other Ambulatory Visit: Payer: Self-pay | Admitting: Endocrinology

## 2023-02-17 DIAGNOSIS — E1165 Type 2 diabetes mellitus with hyperglycemia: Secondary | ICD-10-CM

## 2023-02-17 NOTE — Telephone Encounter (Signed)
Last filled on 12/08/22 Follow up scheduled on 06/10/23 Last filled on 10/22/22 #90 tablets (90 day supply)

## 2023-02-24 ENCOUNTER — Other Ambulatory Visit: Payer: Self-pay

## 2023-02-24 ENCOUNTER — Other Ambulatory Visit: Payer: Self-pay | Admitting: Internal Medicine

## 2023-03-09 ENCOUNTER — Ambulatory Visit: Payer: Medicare PPO | Admitting: Internal Medicine

## 2023-03-09 VITALS — BP 118/74 | HR 90 | Temp 98.6°F | Ht 69.0 in | Wt 220.0 lb

## 2023-03-09 DIAGNOSIS — Z7984 Long term (current) use of oral hypoglycemic drugs: Secondary | ICD-10-CM

## 2023-03-09 DIAGNOSIS — E0840 Diabetes mellitus due to underlying condition with diabetic neuropathy, unspecified: Secondary | ICD-10-CM | POA: Diagnosis not present

## 2023-03-09 DIAGNOSIS — E7800 Pure hypercholesterolemia, unspecified: Secondary | ICD-10-CM | POA: Diagnosis not present

## 2023-03-09 DIAGNOSIS — E538 Deficiency of other specified B group vitamins: Secondary | ICD-10-CM

## 2023-03-09 DIAGNOSIS — E559 Vitamin D deficiency, unspecified: Secondary | ICD-10-CM

## 2023-03-09 DIAGNOSIS — E119 Type 2 diabetes mellitus without complications: Secondary | ICD-10-CM

## 2023-03-09 DIAGNOSIS — I10 Essential (primary) hypertension: Secondary | ICD-10-CM | POA: Diagnosis not present

## 2023-03-09 DIAGNOSIS — N529 Male erectile dysfunction, unspecified: Secondary | ICD-10-CM | POA: Diagnosis not present

## 2023-03-09 MED ORDER — METHYLPHENIDATE HCL ER (OSM) 36 MG PO TBCR: 36.0000 mg | EXTENDED_RELEASE_TABLET | Freq: Every day | ORAL | 0 refills | Status: DC

## 2023-03-09 MED ORDER — SILDENAFIL CITRATE 100 MG PO TABS: 50.0000 mg | ORAL_TABLET | Freq: Every day | ORAL | 11 refills | Status: AC | PRN

## 2023-03-09 NOTE — Patient Instructions (Addendum)
Please call if you would want the Cardiac CT score testing  Please take all new medication as prescribed - the viagra as needed  Please continue all other medications as before, and refills have been done if requested- the concerta  Please have the pharmacy call with any other refills you may need.  Please continue your efforts at being more active, low cholesterol diet, and weight control.  Please keep your appointments with your specialists as you may have planned - your new Endo on Oct 23  We can hold on more blood testing today  Please make an Appointment to return in 6 months, or sooner if needed

## 2023-03-09 NOTE — Progress Notes (Unsigned)
Patient ID: Frank [name], male   DOB: 11-06-1955, 67 y.o.   MRN: [id number]        Chief Complaint: follow up HTN, HLD and hyperglycemia , ED, ADD, low b12 and Vit d       HPI:  Frank [name] is a 67 y.o. male here overall doing well, but having worsening Ed x 6 mo, asking about viagra start.  Pt denies chest pain, increased sob or doe, wheezing, orthopnea, PND, increased LE swelling, palpitations, dizziness or syncope.   Pt denies polydipsia, polyuria, or new focal neuro s/s.    Pt denies fever, wt loss, night sweats, loss of appetite, or other constitutional symptoms  ADD tx working well with good concentration, task completion.  Has new appt with endo oct 2024.  Will call if wants to have the cardiac CT score         Wt Readings from Last 3 Encounters:  03/09/23 220 lb (99.8 kg)  01/28/23 217 lb 9.6 oz (98.7 kg)  12/08/22 219 lb 3.2 oz (99.4 kg)   BP Readings from Last 3 Encounters:  03/09/23 118/74  01/28/23 129/69  12/08/22 106/62         Past Medical History:  Diagnosis Date   Abdominal pain, left lower quadrant 06/06/2010   Abdominal pain, unspecified site 01/19/2009   ADD 10/09/2008   ALLERGIC RHINITIS 10/09/2008   Allergy    ANXIETY DEPRESSION 02/01/2008   Arthritis    hands ?   Blood transfusion without reported diagnosis    Cataract    removed both eyes    COLONIC POLYPS 02/01/2008   DIABETES MELLITUS, TYPE II 05/20/2010   DYSPNEA 03/12/2010   ERECTILE DYSFUNCTION 10/09/2008   ERECTILE DYSFUNCTION, ORGANIC 05/20/2010   FOOT PAIN, LEFT 05/20/2010   GERD 02/01/2008   Guillain Barr syndrome (HCC)    HEMORRHOIDS 02/01/2008   HIATAL HERNIA 02/01/2008   HYPERLIPIDEMIA 10/09/2008   HYPERTENSION 10/09/2008   MORTON'S NEUROMA 05/20/2010   Other specified forms of hearing loss 06/27/2009   PERIPHERAL EDEMA 05/20/2010   PERIPHERAL NEUROPATHY 05/20/2010   Sleep apnea    stopped cpap    SLEEP APNEA, OBSTRUCTIVE 02/01/2008   Stricture and stenosis of esophagus 02/02/2008    Type II or unspecified type diabetes mellitus without mention of complication, uncontrolled 11/14/2010   WRIST PAIN, LEFT 12/05/2009   Past Surgical History:  Procedure Laterality Date   CARPAL TUNNEL RELEASE Bilateral    COLONOSCOPY     DENTAL SURGERY     ESOPHAGEAL DILATION  july 2009   ESOPHAGOGASTRODUODENOSCOPY N/A 06/27/2014   Procedure: ESOPHAGOGASTRODUODENOSCOPY (EGD);  Surgeon: Hart Carwin, MD;  Location: [facility];  Service: Endoscopy;  Laterality: N/A;   EYE SURGERY     catract surgery on both eyes   POLYPECTOMY     ROTATOR CUFF REPAIR     UPPER GASTROINTESTINAL ENDOSCOPY      reports that he has never smoked. He has never used smokeless tobacco. He reports current alcohol use. He reports current drug use. Drug: Methaqualone. family history includes Depression in his mother; Diabetes in his brother and mother; Heart disease in his mother; Hyperlipidemia in his mother. Allergies  Allergen Reactions   Codeine Itching    REACTION: Itching   Influenza Vaccines     Due to hx of GBS   Metformin And Related Other (See Comments)    GI upset, diarrhea   Other Other (See Comments)    Due to hx of GBS- vaccines  Shellfish Allergy    Current Outpatient Medications on File Prior to Visit  Medication Sig Dispense Refill   Accu-Chek Softclix Lancets lancets Use to check blood sugars daily 100 each 11   ARIPiprazole (ABILIFY) 10 MG tablet TAKE 1 TABLET EVERY DAY 90 tablet 3   Continuous Blood Gluc Sensor (DEXCOM G7 SENSOR) MISC Change every 10 days 3 each 2   cyclobenzaprine (FLEXERIL) 5 MG tablet TAKE 1 TABLET AT BEDTIME 90 tablet 1   dapagliflozin propanediol (FARXIGA) 10 MG TABS tablet Take 1 tablet (10 mg total) by mouth daily before breakfast. 90 tablet 3   DROPLET PEN NEEDLES 31G X 8 MM MISC USE TO INJECT TRESIBA UNDER THE SKIN EVERY DAY AS DIRECTED 100 each 3   DULoxetine (CYMBALTA) 60 MG capsule Take 1 capsule (60 mg total) by mouth 2 (two) times daily. 180 capsule 3    glipiZIDE (GLUCOTROL XL) 2.5 MG 24 hr tablet Take 1 tablet (2.5 mg total) by mouth daily with breakfast. 90 tablet 3   glucose blood (ACCU-CHEK GUIDE) test strip Use to check blood sugars daily 100 strip 11   hydrochlorothiazide (MICROZIDE) 12.5 MG capsule TAKE ONE CAPSULE BY MOUTH DAILY 90 capsule 2   LORazepam (ATIVAN) 0.5 MG tablet TAKE 1 TABLET BY MOUTH TWICE DAILY AS NEEDED 60 tablet 5   LORazepam (ATIVAN) 1 MG tablet TAKE 1 TABLET BY MOUTH TWICE A DAY AS NEEDED 60 tablet 5   omeprazole (PRILOSEC OTC) 20 MG tablet Take 20 mg by mouth daily.     pregabalin (LYRICA) 200 MG capsule TAKE (1) CAPSULE TWO TIMES DAILY. 180 capsule 1   rosuvastatin (CRESTOR) 20 MG tablet TAKE ONE TABLET BY MOUTH DAILY 90 tablet 2   Semaglutide, 2 MG/DOSE, (OZEMPIC, 2 MG/DOSE,) 8 MG/3ML SOPN INJECT 2MG  UNDER THE SKIN ONCE A WEEK 9 mL 3   TRESIBA FLEXTOUCH 200 UNIT/ML FlexTouch Pen INJECT 74 UNITS UNDER THE SKIN EVERY DAY 36 mL 3   Current Facility-Administered Medications on File Prior to Visit  Medication Dose Route Frequency Provider Last Rate Last Admin   dextrose 5 % solution   Intravenous Once Danis, Starr Lake III, MD       dextrose 50 % solution 25 mL  25 mL Intravenous Once Sherrilyn Rist, MD            ROS:  All others reviewed and negative.  Objective        PE:  BP 118/74 (BP Location: Left Arm, Patient Position: Sitting, Cuff Size: Normal)   Pulse 90   Temp 98.6 F (37 C) (Oral)   Ht 5\' 9"  (1.753 m)   Wt 220 lb (99.8 kg)   SpO2 98%   BMI 32.49 kg/m                 Constitutional: Pt appears in NAD               HENT: Head: NCAT.                Right Ear: External ear normal.                 Left Ear: External ear normal.                Eyes: . Pupils are equal, round, and reactive to light. Conjunctivae and EOM are normal               Nose: without d/c or deformity  Neck: Neck supple. Gross normal ROM               Cardiovascular: Normal rate and regular rhythm.                  Pulmonary/Chest: Effort normal and breath sounds without rales or wheezing.                Abd:  Soft, NT, ND, + BS, no organomegaly               Neurological: Pt is alert. At baseline orientation, motor grossly intact               Skin: Skin is warm. No rashes, no other new lesions, LE edema - none               Psychiatric: Pt behavior is normal without agitation   Micro: none  Cardiac tracings I have personally interpreted today:  none  Pertinent Radiological findings (summarize): none   Lab Results  Component Value Date   WBC 11.8 (H) 11/02/2022   HGB 17.4 (H) 11/02/2022   HCT 52.1 (H) 11/02/2022   PLT 187 11/02/2022   GLUCOSE 124 (H) 11/02/2022   CHOL 117 09/08/2022   TRIG 177.0 (H) 09/08/2022   HDL 44.20 09/08/2022   LDLDIRECT 130.0 03/03/2022   LDLCALC 38 09/08/2022   ALT 15 11/02/2022   AST 14 (L) 11/02/2022   NA 136 11/02/2022   K 3.8 11/02/2022   CL 99 11/02/2022   CREATININE 1.12 11/02/2022   BUN 16 11/02/2022   CO2 26 11/02/2022   TSH 1.82 09/08/2022   PSA 0.33 09/08/2022   HGBA1C 6.5 (A) 01/28/2023   MICROALBUR 0.8 09/08/2022   Assessment/Plan:  Dardan Zietlow is a 67 y.o. White or Caucasian [1] male with  has a past medical history of Abdominal pain, left lower quadrant (06/06/2010), Abdominal pain, unspecified site (01/19/2009), ADD (10/09/2008), ALLERGIC RHINITIS (10/09/2008), Allergy, ANXIETY DEPRESSION (02/01/2008), Arthritis, Blood transfusion without reported diagnosis, Cataract, COLONIC POLYPS (02/01/2008), DIABETES MELLITUS, TYPE II (05/20/2010), DYSPNEA (03/12/2010), ERECTILE DYSFUNCTION (10/09/2008), ERECTILE DYSFUNCTION, ORGANIC (05/20/2010), FOOT PAIN, LEFT (05/20/2010), GERD (02/01/2008), Guillain Barr syndrome (HCC), HEMORRHOIDS (02/01/2008), HIATAL HERNIA (02/01/2008), HYPERLIPIDEMIA (10/09/2008), HYPERTENSION (10/09/2008), MORTON'S NEUROMA (05/20/2010), Other specified forms of hearing loss (06/27/2009), PERIPHERAL EDEMA (05/20/2010), PERIPHERAL  NEUROPATHY (05/20/2010), Sleep apnea, SLEEP APNEA, OBSTRUCTIVE (02/01/2008), Stricture and stenosis of esophagus (02/02/2008), Type II or unspecified type diabetes mellitus without mention of complication, uncontrolled (11/14/2010), and WRIST PAIN, LEFT (12/05/2009).  Diabetes mellitus due to underlying condition with diabetic neuropathy (HCC) Lab Results  Component Value Date   HGBA1C 6.5 (A) 01/28/2023   Stable, pt to continue current medical treatment farxiga 10 mg every day, glucotrolx l 2.5 every day, ozempic 2 mg weekly, tresiba 74 u qd   Hyperlipidemia Lab Results  Component Value Date   LDLCALC 38 09/08/2022   Stable, pt to continue current statin crestor 20 d   Essential hypertension BP Readings from Last 3 Encounters:  03/09/23 118/74  01/28/23 129/69  12/08/22 106/62   Stable, pt to continue medical treatment hct 12.5 qd   Vitamin D deficiency Last vitamin D Lab Results  Component Value Date   VD25OH 62.70 09/08/2022   Stable, cont oral replacement   B12 deficiency Lab Results  Component Value Date   VITAMINB12 197 (L) 09/08/2022   Low, to start oral replacement - b12 1000 mcg qd   ERECTILE DYSFUNCTION, ORGANIC Worsening, for viagra prn  Followup: Return in  about 6 months (around 09/09/2023).  Oliver Barre, MD 03/11/2023 7:23 PM  [facility] - [facility]

## 2023-03-10 ENCOUNTER — Other Ambulatory Visit: Payer: Self-pay | Admitting: Family Medicine

## 2023-03-10 DIAGNOSIS — E1142 Type 2 diabetes mellitus with diabetic polyneuropathy: Secondary | ICD-10-CM

## 2023-03-10 MED ORDER — TAPENTADOL HCL 50 MG PO TABS: 50.0000 mg | ORAL_TABLET | Freq: Three times a day (TID) | ORAL | 0 refills | Status: DC

## 2023-03-10 NOTE — Telephone Encounter (Signed)
Pt last seen on 12/08/22 Follow up 06/10/23 Rx last filled 02/09/23 #90 tablets (30 day supply) Rx pending to be signed

## 2023-03-10 NOTE — Telephone Encounter (Signed)
Pt is needing a refill request on his  tapentadol (NUCYNTA) 50 MG tablet [facility] [place]

## 2023-03-11 ENCOUNTER — Encounter: Payer: Self-pay | Admitting: Internal Medicine

## 2023-03-11 NOTE — Assessment & Plan Note (Signed)
Lab Results  Component Value Date   VITAMINB12 197 (L) 09/08/2022   Low, to start oral replacement - b12 1000 mcg qd

## 2023-03-11 NOTE — Assessment & Plan Note (Signed)
Lab Results  Component Value Date   LDLCALC 38 09/08/2022   Stable, pt to continue current statin crestor 20 d

## 2023-03-11 NOTE — Assessment & Plan Note (Signed)
BP Readings from Last 3 Encounters:  03/09/23 118/74  01/28/23 129/69  12/08/22 106/62   Stable, pt to continue medical treatment hct 12.5 qd

## 2023-03-11 NOTE — Assessment & Plan Note (Signed)
Worsening, for viagra prn

## 2023-03-11 NOTE — Assessment & Plan Note (Signed)
Last vitamin D Lab Results  Component Value Date   VD25OH 62.70 09/08/2022   Stable, cont oral replacement

## 2023-03-11 NOTE — Assessment & Plan Note (Signed)
Lab Results  Component Value Date   HGBA1C 6.5 (A) 01/28/2023   Stable, pt to continue current medical treatment farxiga 10 mg every day, glucotrolx l 2.5 every day, ozempic 2 mg weekly, tresiba 74 u qd

## 2023-03-22 DIAGNOSIS — E1165 Type 2 diabetes mellitus with hyperglycemia: Secondary | ICD-10-CM | POA: Diagnosis not present

## 2023-03-26 ENCOUNTER — Other Ambulatory Visit: Payer: Self-pay | Admitting: Internal Medicine

## 2023-04-07 ENCOUNTER — Other Ambulatory Visit: Payer: Self-pay | Admitting: Family Medicine

## 2023-04-07 DIAGNOSIS — E1142 Type 2 diabetes mellitus with diabetic polyneuropathy: Secondary | ICD-10-CM

## 2023-04-07 MED ORDER — TAPENTADOL HCL 50 MG PO TABS: 50.0000 mg | ORAL_TABLET | Freq: Three times a day (TID) | ORAL | 0 refills | Status: DC

## 2023-04-07 NOTE — Telephone Encounter (Signed)
Pt request refill for tapentadol (NUCYNTA) 50 MG tablet  sent to [facility] [phone]

## 2023-04-07 NOTE — Telephone Encounter (Signed)
Dispensed Days Supply Quantity Provider Pharmacy  NUCYNTA 50 MG TABLET 03/10/2023 30 90 each Sater, Pearletha Furl, MD [facility] [phone] - G...  NUCYNTA 50 MG TABLET 02/09/2023 30 90 each Sater, Pearletha Furl, MD [facility] [phone] - G...  NUCYNTA 50 MG TABLET 01/12/2023 30 90 each Sater, Pearletha Furl, MD [facility] [phone] - G...  NUCYNTA 50 MG TABLET 12/15/2022 30 90 each Sater, Pearletha Furl, MD [facility] [phone] - G...  NUCYNTA 50 MG TABLET 11/17/2022 30 90 each Sater, Pearletha Furl, MD [facility] [phone] - G...  NUCYNTA 50 MG TABLET 10/18/2022 30 90 each Sater, Pearletha Furl, MD [facility] [phone] - G...  NUCYNTA 50 MG TABLET 09/18/2022 30 90 each Sater, Pearletha Furl, MD [facility] [phone] - G...  NUCYNTA 50 MG TABLET 08/22/2022 30 90 each Sater, Pearletha Furl, MD [facility] [phone] - G...  NUCYNTA 50 MG TABLET 07/21/2022 30 90 each Sater, Pearletha Furl, MD [facility] [phone] - G...  NUCYNTA 75 MG TABLET 06/09/2022 30 90 each Sater, Pearletha Furl, MD [facility] [phone] - G...  Nucynta 75 mg tablet 05/15/2022 30 90 tablet Sater, Pearletha Furl, MD [facility] #...  Nucynta 75 mg tablet 04/15/2022 30 90 tablet Asa Lente, MD   Last visit 12/08/02 Next visit 06/10/23 Disp. 90 tablets 03/10/2023

## 2023-04-09 MED ORDER — TAPENTADOL HCL 50 MG PO TABS: 50.0000 mg | ORAL_TABLET | Freq: Three times a day (TID) | ORAL | 0 refills | Status: DC

## 2023-04-09 NOTE — Telephone Encounter (Signed)
Last seen on 12/08/22 Last filed on 03/10/23 #90 tablets (30 day supply) Rx pending to be signed

## 2023-04-09 NOTE — Addendum Note (Signed)
Addended by: Aura Camps on: 04/09/2023 11:23 AM   Modules accepted: Orders

## 2023-04-09 NOTE — Telephone Encounter (Addendum)
[facility] Joni Reining) Has previous note on file from April 2024. Pt must fill prescription at a local pharmacy. Pt must face to face for refills.  Phone: [phone]

## 2023-04-30 ENCOUNTER — Other Ambulatory Visit: Payer: Self-pay | Admitting: *Deleted

## 2023-04-30 NOTE — Telephone Encounter (Signed)
Pt last seen on 12/08/22 Follow up scheduled on 06/10/23 Last filled on 03/16/23 #180 tablets (90 day supply) too soon to refill   Left message for patient to call to discuss too soon to refill.

## 2023-05-05 ENCOUNTER — Other Ambulatory Visit: Payer: Self-pay | Admitting: Family Medicine

## 2023-05-05 DIAGNOSIS — E1142 Type 2 diabetes mellitus with diabetic polyneuropathy: Secondary | ICD-10-CM

## 2023-05-05 MED ORDER — TAPENTADOL HCL 50 MG PO TABS: 50.0000 mg | ORAL_TABLET | Freq: Three times a day (TID) | ORAL | 0 refills | Status: DC

## 2023-05-05 NOTE — Telephone Encounter (Signed)
Pt is requesting a refill for  tapentadol (NUCYNTA) 50 MG tablet .  [facility] ID: [id number]

## 2023-06-03 ENCOUNTER — Ambulatory Visit: Payer: Medicare PPO | Admitting: "Endocrinology

## 2023-06-03 ENCOUNTER — Encounter: Payer: Self-pay | Admitting: "Endocrinology

## 2023-06-03 VITALS — BP 134/74 | HR 115 | Ht 69.0 in | Wt 217.2 lb

## 2023-06-03 DIAGNOSIS — Z7985 Long-term (current) use of injectable non-insulin antidiabetic drugs: Secondary | ICD-10-CM | POA: Diagnosis not present

## 2023-06-03 DIAGNOSIS — E782 Mixed hyperlipidemia: Secondary | ICD-10-CM | POA: Diagnosis not present

## 2023-06-03 DIAGNOSIS — Z794 Long term (current) use of insulin: Secondary | ICD-10-CM | POA: Diagnosis not present

## 2023-06-03 DIAGNOSIS — Z7984 Long term (current) use of oral hypoglycemic drugs: Secondary | ICD-10-CM | POA: Diagnosis not present

## 2023-06-03 DIAGNOSIS — E1140 Type 2 diabetes mellitus with diabetic neuropathy, unspecified: Secondary | ICD-10-CM

## 2023-06-03 LAB — POCT GLYCOSYLATED HEMOGLOBIN (HGB A1C): Hemoglobin A1C: 8.2 % — AB (ref 4.0–5.6)

## 2023-06-03 MED ORDER — TIRZEPATIDE 5 MG/0.5ML ~~LOC~~ SOAJ: 5.0000 mg | SUBCUTANEOUS | 1 refills | Status: DC

## 2023-06-03 NOTE — Progress Notes (Signed)
Outpatient Endocrinology Note Frank [name] [place], MD  06/03/23   Zenith Will 01/05/1956 [id number]  Referring Provider: Corwin Levins, MD Primary Care Provider: Corwin Levins, MD Reason for consultation: Subjective   Assessment & Plan  Diagnoses and all orders for this visit:  Type 2 diabetes mellitus with diabetic neuropathy, with long-term current use of insulin (HCC) -     POCT glycosylated hemoglobin (Hb A1C) -     tirzepatide (MOUNJARO) 5 MG/0.5ML Pen; Inject 5 mg into the skin once a week. -     Ambulatory referral to Vascular Surgery  Long term (current) use of oral hypoglycemic drugs  Long-term (current) use of injectable non-insulin antidiabetic drugs  Mixed hypercholesterolemia and hypertriglyceridemia    Diabetes Type 2 complicated by neuropathy,  Lab Results  Component Value Date   GFR 67.96 09/12/2022   Hba1c goal less than 7, current Hba1c is  Lab Results  Component Value Date   HGBA1C 8.2 (A) 06/03/2023   Will recommend the following: Farxiga 10 mg daily, Glipizide XL 2.5 mg every day Ozempic 2 mg weekly (change to mounjaro 5 mg to help with weight loss) Evaristo Bury 70 units daily           Side effects from medications: Diarrhea from metformin ER  No known contraindications to any of above medications  -Last LD and Tg are as follows: Lab Results  Component Value Date   LDLCALC 38 09/08/2022    Lab Results  Component Value Date   TRIG 177.0 (H) 09/08/2022   -Recommend rosuvastatin 20 mg QD -Follow low fat diet and exercise   -Blood pressure goal <140/90 - Microalbumin/creatinine goal < 30 -Last MA/Cr is as follows: Lab Results  Component Value Date   MICROALBUR 0.8 09/08/2022   -not on ACE/ARB  -diet changes including salt restriction -limit eating outside -counseled BP targets per standards of diabetes care -uncontrolled blood pressure can lead to retinopathy, nephropathy and cardiovascular and atherosclerotic heart  disease  Reviewed and counseled on: -A1C target -Blood sugar targets -Complications of uncontrolled diabetes  -Checking blood sugar before meals and bedtime and bring log next visit -All medications with mechanism of action and side effects -Hypoglycemia management: rule of 15's, Glucagon Emergency Kit and medical alert ID -low-carb low-fat plate-method diet -At least 20 minutes of physical activity per day -Annual dilated retinal eye exam and foot exam -compliance and follow up needs -follow up as scheduled or earlier if problem gets worse  Call if blood sugar is less than 70 or consistently above 250    Take a 15 gm snack of carbohydrate at bedtime before you go to sleep if your blood sugar is less than 100.    If you are going to fast after midnight for a test or procedure, ask your physician for instructions on how to reduce/decrease your insulin dose.    Call if blood sugar is less than 70 or consistently above 250  -Treating a low sugar by rule of 15  (15 gms of sugar every 15 min until sugar is more than 70) If you feel your sugar is low, test your sugar to be sure If your sugar is low (less than 70), then take 15 grams of a fast acting Carbohydrate (3-4 glucose tablets or glucose gel or 4 ounces of juice or regular soda) Recheck your sugar 15 min after treating low to make sure it is more than 70 If sugar is still less than 70, treat again with 15  grams of carbohydrate          Don't drive the hour of hypoglycemia  If unconscious/unable to eat or drink by mouth, use glucagon injection or nasal spray baqsimi and call 911. Can repeat again in 15 min if still unconscious.  Return in about 3 months (around 09/03/2023).   I have reviewed current medications, nurse's notes, allergies, vital signs, past medical and surgical history, family medical history, and social history for this encounter. Counseled patient on symptoms, examination findings, lab findings, imaging results,  treatment decisions and monitoring and prognosis. The patient understood the recommendations and agrees with the treatment plan. All questions regarding treatment plan were fully answered.  Frank Taloga, MD  06/03/23    History of Present Illness Method Sater is a 67 y.o. year old male who presents for evaluation of Type 2 diabetes mellitus.  Kydon Lingerfelt was first diagnosed in 2012.   Diabetes education +  Home diabetes regimen: Non-insulin hypoglycemic drugs: Farxiga 10 mg daily, Ozempic 2 mg weekly, glipizide XL 2.5 mg daily    Insulin regimen: Tresiba 70 units daily           Side effects from medications: Diarrhea from metformin ER  Previous history:  His A1c has ranged between 7 and 10.8 since 2018 Farxiga was started in 2022, was on Ozempic since 7/22, previously he took Trulicity for a year  COMPLICATIONS -  MI/Stroke -  retinopathy +  neuropathy -  nephropathy  BLOOD SUGAR DATA  CGM interpretation: At today's visit, we reviewed her CGM downloads. The full report is scanned in the media. Reviewing the CGM trends, BG are well controlled throughout the day.    Physical Exam  BP 134/74   Pulse (!) 115   Ht 5\' 9"  (1.753 m)   Wt 217 lb 3.2 oz (98.5 kg)   SpO2 94%   BMI 32.07 kg/m    Constitutional: well developed, well nourished Head: normocephalic, atraumatic Eyes: sclera anicteric, no redness Neck: supple Lungs: normal respiratory effort Neurology: alert and oriented Skin: dry, no appreciable rashes Musculoskeletal: no appreciable defects Psychiatric: normal mood and affect Diabetic Foot Exam - Simple   Simple Foot Form Diabetic Foot exam was performed with the following findings: Yes 06/03/2023 10:30 AM  Visual Inspection No deformities, no ulcerations, no other skin breakdown bilaterally: Yes Sensation Testing See comments: Yes Pulse Check See comments: Yes Comments Monofilament 0/3 B/L, has yellow discolored bug toe nails,  dorsalis pedis light to touch       Current Medications Patient's Medications  New Prescriptions   TIRZEPATIDE (MOUNJARO) 5 MG/0.5ML PEN    Inject 5 mg into the skin once a week.  Previous Medications   ACCU-CHEK SOFTCLIX LANCETS LANCETS    Use to check blood sugars daily   ARIPIPRAZOLE (ABILIFY) 10 MG TABLET    TAKE 1 TABLET EVERY DAY   CONTINUOUS BLOOD GLUC SENSOR (DEXCOM G7 SENSOR) MISC    Change every 10 days   CYCLOBENZAPRINE (FLEXERIL) 5 MG TABLET    TAKE 1 TABLET AT BEDTIME   DAPAGLIFLOZIN PROPANEDIOL (FARXIGA) 10 MG TABS TABLET    Take 1 tablet (10 mg total) by mouth daily before breakfast.   DROPLET PEN NEEDLES 31G X 8 MM MISC    USE TO INJECT TRESIBA UNDER THE SKIN EVERY DAY AS DIRECTED   DULOXETINE (CYMBALTA) 60 MG CAPSULE    Take 1 capsule (60 mg total) by mouth 2 (two) times daily.   GLIPIZIDE (GLUCOTROL XL) 2.5  MG 24 HR TABLET    Take 1 tablet (2.5 mg total) by mouth daily with breakfast.   GLUCOSE BLOOD (ACCU-CHEK GUIDE) TEST STRIP    Use to check blood sugars daily   HYDROCHLOROTHIAZIDE (MICROZIDE) 12.5 MG CAPSULE    TAKE ONE CAPSULE BY MOUTH DAILY   LORAZEPAM (ATIVAN) 0.5 MG TABLET    TAKE 1 TABLET BY MOUTH TWICE DAILY AS NEEDED   LORAZEPAM (ATIVAN) 1 MG TABLET    TAKE 1 TABLET BY MOUTH TWICE A DAY AS NEEDED   METHYLPHENIDATE (CONCERTA) 36 MG PO CR TABLET    Take 1 tablet (36 mg total) by mouth daily.   OMEPRAZOLE (PRILOSEC OTC) 20 MG TABLET    Take 20 mg by mouth daily.   PREGABALIN (LYRICA) 200 MG CAPSULE    TAKE (1) CAPSULE TWO TIMES DAILY.   ROSUVASTATIN (CRESTOR) 20 MG TABLET    TAKE ONE TABLET BY MOUTH DAILY   SILDENAFIL (VIAGRA) 100 MG TABLET    Take 0.5-1 tablets (50-100 mg total) by mouth daily as needed for erectile dysfunction.   TAPENTADOL (NUCYNTA) 50 MG TABLET    Take 1 tablet (50 mg total) by mouth 3 (three) times daily.   TRESIBA FLEXTOUCH 200 UNIT/ML FLEXTOUCH PEN    INJECT 74 UNITS UNDER THE SKIN EVERY DAY  Modified Medications   No medications on file   Discontinued Medications   SEMAGLUTIDE, 2 MG/DOSE, (OZEMPIC, 2 MG/DOSE,) 8 MG/3ML SOPN    INJECT 2MG  UNDER THE SKIN ONCE A WEEK    Allergies Allergies  Allergen Reactions   Codeine Itching    REACTION: Itching   Influenza Vaccines     Due to hx of GBS   Metformin And Related Other (See Comments)    GI upset, diarrhea   Other Other (See Comments)    Due to hx of GBS- vaccines    Shellfish Allergy     Past Medical History Past Medical History:  Diagnosis Date   Abdominal pain, left lower quadrant 06/06/2010   Abdominal pain, unspecified site 01/19/2009   ADD 10/09/2008   ALLERGIC RHINITIS 10/09/2008   Allergy    ANXIETY DEPRESSION 02/01/2008   Arthritis    hands ?   Blood transfusion without reported diagnosis    Cataract    removed both eyes    COLONIC POLYPS 02/01/2008   DIABETES MELLITUS, TYPE II 05/20/2010   DYSPNEA 03/12/2010   ERECTILE DYSFUNCTION 10/09/2008   ERECTILE DYSFUNCTION, ORGANIC 05/20/2010   FOOT PAIN, LEFT 05/20/2010   GERD 02/01/2008   Guillain Barr syndrome (HCC)    HEMORRHOIDS 02/01/2008   HIATAL HERNIA 02/01/2008   HYPERLIPIDEMIA 10/09/2008   HYPERTENSION 10/09/2008   MORTON'S NEUROMA 05/20/2010   Other specified forms of hearing loss 06/27/2009   PERIPHERAL EDEMA 05/20/2010   PERIPHERAL NEUROPATHY 05/20/2010   Sleep apnea    stopped cpap    SLEEP APNEA, OBSTRUCTIVE 02/01/2008   Stricture and stenosis of esophagus 02/02/2008   Type II or unspecified type diabetes mellitus without mention of complication, uncontrolled 11/14/2010   WRIST PAIN, LEFT 12/05/2009    Past Surgical History Past Surgical History:  Procedure Laterality Date   CARPAL TUNNEL RELEASE Bilateral    COLONOSCOPY     DENTAL SURGERY     ESOPHAGEAL DILATION  july 2009   ESOPHAGOGASTRODUODENOSCOPY N/A 06/27/2014   Procedure: ESOPHAGOGASTRODUODENOSCOPY (EGD);  Surgeon: Hart Carwin, MD;  Location: [facility];  Service: Endoscopy;  Laterality: N/A;   EYE SURGERY     catract surgery  on  both eyes   POLYPECTOMY     ROTATOR CUFF REPAIR     UPPER GASTROINTESTINAL ENDOSCOPY      Family History family history includes Depression in his mother; Diabetes in his brother and mother; Heart disease in his mother; Hyperlipidemia in his mother.  Social History Social History   Socioeconomic History   Marital status: Single    Spouse name: Not on file   Number of children: Not on file   Years of education: Not on file   Highest education level: Not on file  Occupational History   Occupation: Housekeeper [employer]    Employer: [employer]  Tobacco Use   Smoking status: Never   Smokeless tobacco: Never  Vaping Use   Vaping status: Never Used  Substance and Sexual Activity   Alcohol use: Yes    Comment: occasional   Drug use: Yes    Types: Methaqualone   Sexual activity: Yes    Comment: before I got sick  Other Topics Concern   Not on file  Social History Narrative   Not on file   Social Determinants of Health   Financial Resource Strain: Not on file  Food Insecurity: Not on file  Transportation Needs: Not on file  Physical Activity: Not on file  Stress: Not on file  Social Connections: Not on file  Intimate Partner Violence: Not on file    Lab Results  Component Value Date   HGBA1C 8.2 (A) 06/03/2023   HGBA1C 6.5 (A) 01/28/2023   HGBA1C 7.6 (H) 09/12/2022   Lab Results  Component Value Date   CHOL 117 09/08/2022   Lab Results  Component Value Date   HDL 44.20 09/08/2022   Lab Results  Component Value Date   LDLCALC 38 09/08/2022   Lab Results  Component Value Date   TRIG 177.0 (H) 09/08/2022   Lab Results  Component Value Date   CHOLHDL 3 09/08/2022   Lab Results  Component Value Date   CREATININE 1.12 11/02/2022   Lab Results  Component Value Date   GFR 67.96 09/12/2022   Lab Results  Component Value Date   MICROALBUR 0.8 09/08/2022      Component Value Date/Time   NA 136 11/02/2022 1856   K 3.8 11/02/2022 1856   CL 99  11/02/2022 1856   CO2 26 11/02/2022 1856   GLUCOSE 124 (H) 11/02/2022 1856   BUN 16 11/02/2022 1856   CREATININE 1.12 11/02/2022 1856   CALCIUM 9.4 11/02/2022 1856   PROT 8.3 (H) 11/02/2022 1856   PROT 7.0 08/30/2015 1157   ALBUMIN 4.6 11/02/2022 1856   AST 14 (L) 11/02/2022 1856   ALT 15 11/02/2022 1856   ALKPHOS 77 11/02/2022 1856   BILITOT 1.3 (H) 11/02/2022 1856   GFRNONAA >60 11/02/2022 1856   GFRAA >90 08/06/2014 0240      Latest Ref Rng & Units 11/02/2022    6:56 PM 09/12/2022   10:15 AM 09/08/2022   11:49 AM  BMP  Glucose 70 - 99 mg/dL 324  401  027   BUN 8 - 23 mg/dL 16  19  19    Creatinine 0.61 - 1.24 mg/dL 2.53  6.64  4.03   Sodium 135 - 145 mmol/L 136  138  140   Potassium 3.5 - 5.1 mmol/L 3.8  4.2  4.1   Chloride 98 - 111 mmol/L 99  98  100   CO2 22 - 32 mmol/L 26  32  30   Calcium 8.9 -  10.3 mg/dL 9.4  9.8  9.9        Component Value Date/Time   WBC 11.8 (H) 11/02/2022 1856   RBC 6.11 (H) 11/02/2022 1856   HGB 17.4 (H) 11/02/2022 1856   HCT 52.1 (H) 11/02/2022 1856   PLT 187 11/02/2022 1856   MCV 85.3 11/02/2022 1856   MCV 93.0 12/30/2013 1537   MCH 28.5 11/02/2022 1856   MCHC 33.4 11/02/2022 1856   RDW 14.3 11/02/2022 1856   LYMPHSABS 2.9 09/08/2022 1149   MONOABS 0.6 09/08/2022 1149   EOSABS 0.1 09/08/2022 1149   BASOSABS 0.1 09/08/2022 1149     Parts of this note may have been dictated using voice recognition software. There may be variances in spelling and vocabulary which are unintentional. Not all errors are proofread. Please notify the [facility] Parkin if any discrepancies are noted or if the meaning of any statement is not clear.

## 2023-06-04 ENCOUNTER — Other Ambulatory Visit: Payer: Self-pay | Admitting: Family Medicine

## 2023-06-04 DIAGNOSIS — E1142 Type 2 diabetes mellitus with diabetic polyneuropathy: Secondary | ICD-10-CM

## 2023-06-04 MED ORDER — TAPENTADOL HCL 50 MG PO TABS: 50.0000 mg | ORAL_TABLET | Freq: Three times a day (TID) | ORAL | 0 refills | Status: DC

## 2023-06-04 NOTE — Telephone Encounter (Signed)
Pt called needing a refill on his  tapentadol (NUCYNTA) 50 MG tablet and needing it sent to the [facility] on [address]

## 2023-06-04 NOTE — Telephone Encounter (Signed)
Last seen 12/08/22 and next f/u 06/10/23. Last refilled 0926/24 #90.

## 2023-06-10 ENCOUNTER — Encounter: Payer: Self-pay | Admitting: Neurology

## 2023-06-10 ENCOUNTER — Ambulatory Visit: Payer: Medicare PPO | Admitting: Neurology

## 2023-06-10 VITALS — BP 119/73 | HR 80 | Ht 69.0 in | Wt 224.0 lb

## 2023-06-10 DIAGNOSIS — G4733 Obstructive sleep apnea (adult) (pediatric): Secondary | ICD-10-CM | POA: Diagnosis not present

## 2023-06-10 DIAGNOSIS — Z7984 Long term (current) use of oral hypoglycemic drugs: Secondary | ICD-10-CM | POA: Diagnosis not present

## 2023-06-10 DIAGNOSIS — G894 Chronic pain syndrome: Secondary | ICD-10-CM | POA: Diagnosis not present

## 2023-06-10 DIAGNOSIS — R208 Other disturbances of skin sensation: Secondary | ICD-10-CM

## 2023-06-10 DIAGNOSIS — E1142 Type 2 diabetes mellitus with diabetic polyneuropathy: Secondary | ICD-10-CM

## 2023-06-10 DIAGNOSIS — G629 Polyneuropathy, unspecified: Secondary | ICD-10-CM

## 2023-06-10 DIAGNOSIS — G610 Guillain-Barre syndrome: Secondary | ICD-10-CM

## 2023-06-10 NOTE — Progress Notes (Signed)
[facility]  PATIENT: Frank [name] DOB: Dec 20, 1955  _________________________________   HISTORICAL  CHIEF COMPLAINT:  Chief Complaint  Patient presents with   Follow-up    Pt in room 10. Here for Diabetic polyneuropathy follow up. Pt said neuropathy pain is worse, legs, feet and hands. Pt said hands feel stiff and hurt. Pt no longer uses cpap machine.    HISTORY OF PRESENT ILLNESS:  Frank [name] is a 67 year old man with a chronic peripheral neuropathy who had superimposed Guillain Barr syndrome in November 2015 and has had severe neuropathic pain  Update 06/10/23: He feels the pain is doing doing worse than last visit.   Pain is up to the knees in the legs and also in fingers.  Quality is burning. Pain is mostly from diabetic polyneuropathy.  He also has carpal tunnel syndrome.  He also feels his muscles are stiff, .   He is on Nucynta 50 mg tid, Lyrica 200 mg po bid and Cymbalta 60 mg po bid.  Nucynta sometimes causes mild sleepiness.  Due to him having untreated OSA I also reduced  from 75 to 50 mg in 2023.   His PCP prescribes lorazepam 0.5 mg bid.   He is on methylphenidate ER 36 qAM   The NCCS database shows good compliance.       He has a lot of numbness in his feet.   No sores.    No falls.   He needs to hold something if eyes are closed  He is sleeping well.   He has OSA (severe 69.3) but was unable to tolerate CPAP (tried one mask but feels he would not tolerate with other masks). He has gained 10 pounds over the past yeart.   He wakes up a couple times a night due to nocturia but quickly falls back asleep..  We discussed he has severe OSA but did not tolerate CPAP and is not interested in surgery or oral appliance.   He did not lose any weight on Ozempic but will be trying a different medication (Mounjoro??)    EPWORTH SLEEPINESS SCALE 06/10/2023  On a scale of 0 - 3 what is the chance of dozing:  Sitting and Reading:   1 Watching  TV:    2 Sitting inactive in a public place: 2 Passenger in car for one hour: 2 Lying down to rest in the afternoon: 3 Sitting and talking to someone: 0 Sitting quietly after lunch:  3 In a car, stopped in traffic:  0    Total (out of 24):   13/24 mild ESS   B12 was low 2021 but he never supplemented.     GBS History:   The initial episode of GBS was treated with plasmapheresis. He reports that the diagnosis took a couple weeks and he had several emergency room visits. Initially he was sent to a skilled nursing facility. He was reporting a lot of back pain alongside the weakness. A lumbar puncture was eventually done showing high protein which led to the dye diagnosis of GBS.  He had severe weakness at the peak and required intubation for respiratory support. At that time, he was unable to move his legs and could barely move his arms. He improved quite a bit after plasmapheresis and was discharged. However, a couple days after discharge he began to feel weak again and return to the emergency room. He was recently admitted for recurrent GBS. For that hospital stay he did not require intubation but did  have a facemask oxygen. He also was treated with IVIG the second time around. He improved and was discharged home. He did outpatient physical therapy. He has had a fairly good in movement of his strength and is able to walk independently. However, he has had more dysesthetic pain.   With the exception of more pain, he otherwise feels very close to his pre-GBS baseline.   Of note, he had a flu shot a few weeks before the first episode of GBS.  MRI's from 06/2014 and 07/2014:    Cervical MRI shows spinal stenosis at C4-C5, C5-C6 and C6-C7. There is no spinal cord compression. Thoracic spine shows T8-T9 disc extrusion to the left that does not cause spinal cord compression. There are mild degenerative changes in the lumbar spine. Contrast was not used.   I also reviewed many of the notes and labs from his  hospital stay. CSF Protein was greatly elevated at 118 and the CSF white blood cell count was 1.    REVIEW OF SYSTEMS: Constitutional: No fevers, chills, sweats, or change in appetite.   Has sleepiness Eyes: No visual changes, double vision, eye pain Ear, nose and throat: No hearing loss, ear pain, nasal congestion, sore throat Cardiovascular: No chest pain, palpitations Respiratory:  No shortness of breath at rest or with exertion.   No wheezes.  He has OSA GastrointestinaI: No nausea, vomiting, diarrhea, abdominal pain, fecal incontinence Genitourinary:  No dysuria, urinary retention or frequency.  No nocturia. Musculoskeletal:  No neck pain, back pain Integumentary: No rash, pruritus, skin lesions Neurological: as above Psychiatric: Anxiety and depression at this time.   ADD is stable. Endocrine: No palpitations, diaphoresis, change in appetite, change in weigh or increased thirst Hematologic/Lymphatic:  No anemia, purpura, petechiae. Allergic/Immunologic: No itchy/runny eyes, nasal congestion, recent allergic reactions, rashes  ALLERGIES: Allergies  Allergen Reactions   Codeine Itching    REACTION: Itching   Influenza Vaccines     Due to hx of GBS   Metformin And Related Other (See Comments)    GI upset, diarrhea   Other Other (See Comments)    Due to hx of GBS- vaccines    Shellfish Allergy     HOME MEDICATIONS:  Current Outpatient Medications:    Accu-Chek Softclix Lancets lancets, Use to check blood sugars daily, Disp: 100 each, Rfl: 11   ARIPiprazole (ABILIFY) 10 MG tablet, TAKE 1 TABLET EVERY DAY, Disp: 90 tablet, Rfl: 3   cyclobenzaprine (FLEXERIL) 5 MG tablet, TAKE 1 TABLET AT BEDTIME, Disp: 90 tablet, Rfl: 1   dapagliflozin propanediol (FARXIGA) 10 MG TABS tablet, Take 1 tablet (10 mg total) by mouth daily before breakfast., Disp: 90 tablet, Rfl: 3   DROPLET PEN NEEDLES 31G X 8 MM MISC, USE TO INJECT TRESIBA UNDER THE SKIN EVERY DAY AS DIRECTED, Disp: 100 each,  Rfl: 3   DULoxetine (CYMBALTA) 60 MG capsule, Take 1 capsule (60 mg total) by mouth 2 (two) times daily., Disp: 180 capsule, Rfl: 3   glipiZIDE (GLUCOTROL XL) 2.5 MG 24 hr tablet, Take 1 tablet (2.5 mg total) by mouth daily with breakfast., Disp: 90 tablet, Rfl: 3   glucose blood (ACCU-CHEK GUIDE) test strip, Use to check blood sugars daily, Disp: 100 strip, Rfl: 11   hydrochlorothiazide (MICROZIDE) 12.5 MG capsule, TAKE ONE CAPSULE BY MOUTH DAILY, Disp: 90 capsule, Rfl: 2   LORazepam (ATIVAN) 0.5 MG tablet, TAKE 1 TABLET BY MOUTH TWICE DAILY AS NEEDED, Disp: 60 tablet, Rfl: 5   LORazepam (ATIVAN) 1  MG tablet, TAKE 1 TABLET BY MOUTH TWICE A DAY AS NEEDED, Disp: 60 tablet, Rfl: 5   methylphenidate (CONCERTA) 36 MG PO CR tablet, Take 1 tablet (36 mg total) by mouth daily., Disp: 30 tablet, Rfl: 0   omeprazole (PRILOSEC OTC) 20 MG tablet, Take 20 mg by mouth daily., Disp: , Rfl:    pregabalin (LYRICA) 200 MG capsule, TAKE (1) CAPSULE TWO TIMES DAILY., Disp: 180 capsule, Rfl: 1   rosuvastatin (CRESTOR) 20 MG tablet, TAKE ONE TABLET BY MOUTH DAILY, Disp: 90 tablet, Rfl: 2   tapentadol (NUCYNTA) 50 MG tablet, Take 1 tablet (50 mg total) by mouth 3 (three) times daily., Disp: 90 tablet, Rfl: 0   TRESIBA FLEXTOUCH 200 UNIT/ML FlexTouch Pen, INJECT 74 UNITS UNDER THE SKIN EVERY DAY, Disp: 36 mL, Rfl: 3   Continuous Blood Gluc Sensor (DEXCOM G7 SENSOR) MISC, Change every 10 days, Disp: 3 each, Rfl: 2   sildenafil (VIAGRA) 100 MG tablet, Take 0.5-1 tablets (50-100 mg total) by mouth daily as needed for erectile dysfunction. (Patient not taking: Reported on 06/10/2023), Disp: 5 tablet, Rfl: 11   tirzepatide (MOUNJARO) 5 MG/0.5ML Pen, Inject 5 mg into the skin once a week. (Patient not taking: Reported on 06/10/2023), Disp: 6 mL, Rfl: 1  Current Facility-Administered Medications:    dextrose 5 % solution, , Intravenous, Once, Danis, Starr Lake III, MD   dextrose 50 % solution 25 mL, 25 mL, Intravenous, Once,  Danis, Andreas Blower, MD  PAST MEDICAL HISTORY: Past Medical History:  Diagnosis Date   Abdominal pain, left lower quadrant 06/06/2010   Abdominal pain, unspecified site 01/19/2009   ADD 10/09/2008   ALLERGIC RHINITIS 10/09/2008   Allergy    ANXIETY DEPRESSION 02/01/2008   Arthritis    hands ?   Blood transfusion without reported diagnosis    Cataract    removed both eyes    COLONIC POLYPS 02/01/2008   DIABETES MELLITUS, TYPE II 05/20/2010   DYSPNEA 03/12/2010   ERECTILE DYSFUNCTION 10/09/2008   ERECTILE DYSFUNCTION, ORGANIC 05/20/2010   FOOT PAIN, LEFT 05/20/2010   GERD 02/01/2008   Guillain Barr syndrome (HCC)    HEMORRHOIDS 02/01/2008   HIATAL HERNIA 02/01/2008   HYPERLIPIDEMIA 10/09/2008   HYPERTENSION 10/09/2008   MORTON'S NEUROMA 05/20/2010   Other specified forms of hearing loss 06/27/2009   PERIPHERAL EDEMA 05/20/2010   PERIPHERAL NEUROPATHY 05/20/2010   Sleep apnea    stopped cpap    SLEEP APNEA, OBSTRUCTIVE 02/01/2008   Stricture and stenosis of esophagus 02/02/2008   Type II or unspecified type diabetes mellitus without mention of complication, uncontrolled 11/14/2010   WRIST PAIN, LEFT 12/05/2009    PAST SURGICAL HISTORY: Past Surgical History:  Procedure Laterality Date   CARPAL TUNNEL RELEASE Bilateral    COLONOSCOPY     DENTAL SURGERY     ESOPHAGEAL DILATION  july 2009   ESOPHAGOGASTRODUODENOSCOPY N/A 06/27/2014   Procedure: ESOPHAGOGASTRODUODENOSCOPY (EGD);  Surgeon: Hart Carwin, MD;  Location: [facility];  Service: Endoscopy;  Laterality: N/A;   EYE SURGERY     catract surgery on both eyes   POLYPECTOMY     ROTATOR CUFF REPAIR     UPPER GASTROINTESTINAL ENDOSCOPY      FAMILY HISTORY: Family History  Problem Relation Age of Onset   Diabetes Mother    Heart disease Mother    Hyperlipidemia Mother    Depression Mother    Diabetes Brother    Colon cancer Neg Hx    Colon polyps Neg  Hx    Esophageal cancer Neg Hx    Rectal cancer Neg Hx    Stomach cancer Neg  Hx     SOCIAL HISTORY:  Social History   Socioeconomic History   Marital status: Single    Spouse name: Not on file   Number of children: Not on file   Years of education: Not on file   Highest education level: Not on file  Occupational History   Occupation: Housekeeper [employer]    Employer: [employer]  Tobacco Use   Smoking status: Never   Smokeless tobacco: Never  Vaping Use   Vaping status: Never Used  Substance and Sexual Activity   Alcohol use: Yes    Comment: occasional   Drug use: Yes    Types: Methaqualone   Sexual activity: Yes    Comment: before I got sick  Other Topics Concern   Not on file  Social History Narrative   Not on file   Social Determinants of Health   Financial Resource Strain: Not on file  Food Insecurity: Not on file  Transportation Needs: Not on file  Physical Activity: Not on file  Stress: Not on file  Social Connections: Not on file  Intimate Partner Violence: Not on file     PHYSICAL EXAM  Vitals:   06/10/23 0952  BP: 119/73  Pulse: 80  Weight: 224 lb (101.6 kg)  Height: 5\' 9"  (1.753 m)    Body mass index is 33.08 kg/m.   General: The patient is well-developed and well-nourished and in no acute distress  Skin: Extremities are without rash. He has mild pedal edema, slightly worse on the left.    HEENT:   His pharynx is Mallampatti 3.  Neurologic Exam  Mental status: The patient is alert and oriented x 3 at the time of the examination. The patient has apparent normal recent and remote memory, with an apparently normal attention span and concentration ability.   Speech is normal.  Cranial nerves: Extraocular movements are full. Facial strength and sensation are normal..  Trapezius and sternocleidomastoid strength is normal. No dysarthria is noted.   . No obvious hearing deficits are noted.  Motor:  Muscle bulk is normal. Muscle tone is normal. Strength is 5/5 proximally in the arms and legs.  Strength was 4+/5 in the  intrinsic hand muscles and the EHL and intrinsic foot muscles.  Sensory: Sensory testing is intact at the wrist but reduced vib (normal touch) at fingertips.   There is mild reduced vibration sensation at the knees, but is severe loss at the ankles (<10%) and absent at the toes.  Reduced vib in fingers Touch/pinprick is reduced  mid shin and very reduced in the feet.   Reduced pp in fingers  Coordination: He has good finger-nose-finger but reduced heel-to-shin bilaterally.  Gait and station: Station is normal.   The gait has a reduced stride.   The tandem gait is poor and wide.. Romberg is positive.   Reflexes: Deep tendon reflexes are symmetric  ric and 1+ bilaterally at deltoid and trace at knees, absent at the ankles.    DIAGNOSTIC DATA (LABS, IMAGING, TESTING) - I reviewed patient records, labs, notes, testing and imaging myself where available.  Lab Results  Component Value Date   WBC 11.8 (H) 11/02/2022   HGB 17.4 (H) 11/02/2022   HCT 52.1 (H) 11/02/2022   MCV 85.3 11/02/2022   PLT 187 11/02/2022      Component Value Date/Time   NA 136 11/02/2022  1856   K 3.8 11/02/2022 1856   CL 99 11/02/2022 1856   CO2 26 11/02/2022 1856   GLUCOSE 124 (H) 11/02/2022 1856   BUN 16 11/02/2022 1856   CREATININE 1.12 11/02/2022 1856   CALCIUM 9.4 11/02/2022 1856   PROT 8.3 (H) 11/02/2022 1856   PROT 7.0 08/30/2015 1157   ALBUMIN 4.6 11/02/2022 1856   AST 14 (L) 11/02/2022 1856   ALT 15 11/02/2022 1856   ALKPHOS 77 11/02/2022 1856   BILITOT 1.3 (H) 11/02/2022 1856   GFRNONAA >60 11/02/2022 1856   GFRAA >90 08/06/2014 0240   Lab Results  Component Value Date   CHOL 117 09/08/2022   HDL 44.20 09/08/2022   LDLCALC 38 09/08/2022   LDLDIRECT 130.0 03/03/2022   TRIG 177.0 (H) 09/08/2022   CHOLHDL 3 09/08/2022   Lab Results  Component Value Date   HGBA1C 8.2 (A) 06/03/2023   Lab Results  Component Value Date   VITAMINB12 197 (L) 09/08/2022   Lab Results  Component Value Date    TSH 1.82 09/08/2022       ASSESSMENT AND PLAN  Polyneuropathy - Plan: NCV with EMG(electromyography), Multiple Myeloma Panel (SPEP&IFE w/QIG), Sjogren's syndrome antibods(ssa + ssb), Drug Screen, Urine([facility])  Recurrent GBS (Guillain-Barre syndrome) (HCC) - Plan: NCV with EMG(electromyography)  Chronic pain syndrome - Plan: Drug Screen, Urine([facility])  Diabetic polyneuropathy associated with type 2 diabetes mellitus (HCC)  Obstructive sleep apnea  Dysesthesia  1.  For the chronic pain, we will continue Lyrica, Nucynta and Cymbalta.  Because he has untreated sleep apnea, I did reduce the Nucynta dose from 75 mg 3 times daily to 50 mg 3 times daily back in 2023     PDMP reviewed and he is compliant.    Will check UDS today.   Because his neuropathy has slightly worsened over the last few years, I would like to repeat evaluate an NCV/EMG study.  It is possible he has CIDP.  Additionally, we will check SPEP/IEF and SSA/SSB. 2.   He has severe OSA and could not tolerate CPAP.   On multiple occasions, I have discussed the risks of untreated severe sleep apnea including a higher risk of heart attack, arrhythmia and stroke.  He does not feel he could tolerate CPAP and does not want to do a retitration.  We discussed the inspire device and UPPP but he is not interested in any surgical approach.  I also advised him to discuss further with his primary care if he is a candidate for other weight loss drug.   3.    Stay active and exercise as tolerated. 4.    B12 is slightly low.  Advised to take 1000 mcg daily   Return in 4 months or sooner if there are new or worsening neurologic symptoms   This visit is part of a comprehensive longitudinal care medical relationship regarding the patients primary diagnosis of neuropathy and related concerns. Pearletha Furl. Epimenio Foot, MD, PhD 06/10/2023, 10:25 AM Certified in Neurology, Clinical Neurophysiology, Sleep Medicine, Pain Medicine and  Neuroimaging  [facility] [address], [address] [place], [place] [address] [phone]

## 2023-06-12 LAB — DRUG SCREEN, URINE
Amphetamines, Urine: NEGATIVE ng/mL
Barbiturate screen, urine: NEGATIVE ng/mL
Benzodiazepine Quant, Ur: NEGATIVE ng/mL
Cannabinoid Quant, Ur: NEGATIVE ng/mL
Cocaine (Metab.): NEGATIVE ng/mL
Opiate Quant, Ur: NEGATIVE ng/mL
PCP Quant, Ur: NEGATIVE ng/mL

## 2023-06-16 LAB — MULTIPLE MYELOMA PANEL, SERUM
Albumin SerPl Elph-Mcnc: 3.9 g/dL (ref 2.9–4.4)
Albumin/Glob SerPl: 1.4 (ref 0.7–1.7)
Alpha 1: 0.2 g/dL (ref 0.0–0.4)
Alpha2 Glob SerPl Elph-Mcnc: 0.6 g/dL (ref 0.4–1.0)
B-Globulin SerPl Elph-Mcnc: 1 g/dL (ref 0.7–1.3)
Gamma Glob SerPl Elph-Mcnc: 1.2 g/dL (ref 0.4–1.8)
Globulin, Total: 3 g/dL (ref 2.2–3.9)
IgA/Immunoglobulin A, Serum: 167 mg/dL (ref 61–437)
IgG (Immunoglobin G), Serum: 1206 mg/dL (ref 603–1613)
IgM (Immunoglobulin M), Srm: 64 mg/dL (ref 20–172)
Total Protein: 6.9 g/dL (ref 6.0–8.5)

## 2023-06-16 LAB — SJOGREN'S SYNDROME ANTIBODS(SSA + SSB)
ENA SSA (RO) Ab: <0.2 AI (ref 0.0–0.9)
ENA SSB (LA) Ab: <0.2 AI (ref 0.0–0.9)

## 2023-06-17 ENCOUNTER — Telehealth: Payer: Self-pay | Admitting: *Deleted

## 2023-06-17 NOTE — Telephone Encounter (Signed)
-----   Message from Asa Lente sent at 06/16/2023 10:35 AM EST ----- Please let the patient know that the lab work was normal.

## 2023-06-17 NOTE — Telephone Encounter (Signed)
Called pt at [phone]. Relayed results per Dr. Bonnita Hollow note. Pt verbalized understanding.

## 2023-06-20 DIAGNOSIS — E1140 Type 2 diabetes mellitus with diabetic neuropathy, unspecified: Secondary | ICD-10-CM | POA: Diagnosis not present

## 2023-07-02 ENCOUNTER — Other Ambulatory Visit: Payer: Self-pay | Admitting: Neurology

## 2023-07-02 DIAGNOSIS — E1142 Type 2 diabetes mellitus with diabetic polyneuropathy: Secondary | ICD-10-CM

## 2023-07-02 NOTE — Telephone Encounter (Signed)
Pt request refill for tapentadol (NUCYNTA) 50 MG tablet send to [id number] -

## 2023-07-03 ENCOUNTER — Other Ambulatory Visit: Payer: Self-pay | Admitting: *Deleted

## 2023-07-03 DIAGNOSIS — R209 Unspecified disturbances of skin sensation: Secondary | ICD-10-CM

## 2023-07-07 MED ORDER — TAPENTADOL HCL 50 MG PO TABS: 50.0000 mg | ORAL_TABLET | Freq: Three times a day (TID) | ORAL | 0 refills | Status: DC

## 2023-07-07 NOTE — Telephone Encounter (Signed)
Last seen on 06/10/23 Follow up scheduled on 01/07/23 Last filled on 06/05/23 #90 tablets (30 day supply) Rx pending to be signed

## 2023-07-07 NOTE — Telephone Encounter (Signed)
Pt's spouse reports pt will run out of this medication today.  This was supposed to have been done on 11-21 but was not properly routed as to why this is being sent now as a high priority.

## 2023-07-07 NOTE — Telephone Encounter (Signed)
Rx signed by Dr.Sater.

## 2023-07-15 NOTE — Progress Notes (Unsigned)
Office Note     CC:  Weak pedal pulses Requesting Provider:  Altamese Glencoe, MD  HPI: Frank [name] is a 67 y.o. (January 20, 1956) male presenting at the request of .Corwin Levins, MD for evaluation of weak pedal pulses with longstanding diabetes and peripheral neuropathy.  On exam, Frank [name] was doing well, accompanied by his partner.  A native of [place], he moved to [place] decades ago.  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  He spent weeks in the hospital, and had to relearn basic ADLs.  He continues to live independently with his partner, and major complaint is bilateral lower extremity neuropathy, which has been present for quite some time.  He remains ambulatory, but lives a relatively sedentary lifestyle.  Notes some intermittent cramping at night.  Denies symptoms associated with claudication, ischemic rest pain, tissue loss.   Past Medical History:  Diagnosis Date   Abdominal pain, left lower quadrant 06/06/2010   Abdominal pain, unspecified site 01/19/2009   ADD 10/09/2008   ALLERGIC RHINITIS 10/09/2008   Allergy    ANXIETY DEPRESSION 02/01/2008   Arthritis    hands ?   Blood transfusion without reported diagnosis    Cataract    removed both eyes    COLONIC POLYPS 02/01/2008   DIABETES MELLITUS, TYPE II 05/20/2010   DYSPNEA 03/12/2010   ERECTILE DYSFUNCTION 10/09/2008   ERECTILE DYSFUNCTION, ORGANIC 05/20/2010   FOOT PAIN, LEFT 05/20/2010   GERD 02/01/2008   Guillain Barr syndrome (HCC)    HEMORRHOIDS 02/01/2008   HIATAL HERNIA 02/01/2008   HYPERLIPIDEMIA 10/09/2008   HYPERTENSION 10/09/2008   MORTON'S NEUROMA 05/20/2010   Other specified forms of hearing loss 06/27/2009   PERIPHERAL EDEMA 05/20/2010   PERIPHERAL NEUROPATHY 05/20/2010   Sleep apnea    stopped cpap    SLEEP APNEA, OBSTRUCTIVE 02/01/2008   Stricture and stenosis of esophagus 02/02/2008   Type II or unspecified type diabetes mellitus without mention of complication, uncontrolled  11/14/2010   WRIST PAIN, LEFT 12/05/2009    Past Surgical History:  Procedure Laterality Date   CARPAL TUNNEL RELEASE Bilateral    COLONOSCOPY     DENTAL SURGERY     ESOPHAGEAL DILATION  july 2009   ESOPHAGOGASTRODUODENOSCOPY N/A 06/27/2014   Procedure: ESOPHAGOGASTRODUODENOSCOPY (EGD);  Surgeon: Hart Carwin, MD;  Location: [facility];  Service: Endoscopy;  Laterality: N/A;   EYE SURGERY     catract surgery on both eyes   POLYPECTOMY     ROTATOR CUFF REPAIR     UPPER GASTROINTESTINAL ENDOSCOPY      Social History   Socioeconomic History   Marital status: Single    Spouse name: Not on file   Number of children: Not on file   Years of education: Not on file   Highest education level: Not on file  Occupational History   Occupation: Housekeeper [employer]    Employer: [employer]  Tobacco Use   Smoking status: Never   Smokeless tobacco: Never  Vaping Use   Vaping status: Never Used  Substance and Sexual Activity   Alcohol use: Yes    Comment: occasional   Drug use: Yes    Types: Methaqualone   Sexual activity: Yes    Comment: before I got sick  Other Topics Concern   Not on file  Social History Narrative   Not on file   Social Determinants of Health   Financial Resource Strain: Not on file  Food Insecurity: Not on file  Transportation Needs:  Not on file  Physical Activity: Not on file  Stress: Not on file  Social Connections: Not on file  Intimate Partner Violence: Not on file   Family History  Problem Relation Age of Onset   Diabetes Mother    Heart disease Mother    Hyperlipidemia Mother    Depression Mother    Diabetes Brother    Colon cancer Neg Hx    Colon polyps Neg Hx    Esophageal cancer Neg Hx    Rectal cancer Neg Hx    Stomach cancer Neg Hx     Current Outpatient Medications  Medication Sig Dispense Refill   Accu-Chek Softclix Lancets lancets Use to check blood sugars daily 100 each 11   ARIPiprazole (ABILIFY) 10 MG tablet TAKE 1 TABLET  EVERY DAY 90 tablet 3   Continuous Blood Gluc Sensor (DEXCOM G7 SENSOR) MISC Change every 10 days 3 each 2   cyclobenzaprine (FLEXERIL) 5 MG tablet TAKE 1 TABLET AT BEDTIME 90 tablet 1   dapagliflozin propanediol (FARXIGA) 10 MG TABS tablet Take 1 tablet (10 mg total) by mouth daily before breakfast. 90 tablet 3   DROPLET PEN NEEDLES 31G X 8 MM MISC USE TO INJECT TRESIBA UNDER THE SKIN EVERY DAY AS DIRECTED 100 each 3   DULoxetine (CYMBALTA) 60 MG capsule Take 1 capsule (60 mg total) by mouth 2 (two) times daily. 180 capsule 3   glipiZIDE (GLUCOTROL XL) 2.5 MG 24 hr tablet Take 1 tablet (2.5 mg total) by mouth daily with breakfast. 90 tablet 3   glucose blood (ACCU-CHEK GUIDE) test strip Use to check blood sugars daily 100 strip 11   hydrochlorothiazide (MICROZIDE) 12.5 MG capsule TAKE ONE CAPSULE BY MOUTH DAILY 90 capsule 2   LORazepam (ATIVAN) 0.5 MG tablet TAKE 1 TABLET BY MOUTH TWICE DAILY AS NEEDED 60 tablet 5   LORazepam (ATIVAN) 1 MG tablet TAKE 1 TABLET BY MOUTH TWICE A DAY AS NEEDED 60 tablet 5   methylphenidate (CONCERTA) 36 MG PO CR tablet Take 1 tablet (36 mg total) by mouth daily. 30 tablet 0   omeprazole (PRILOSEC OTC) 20 MG tablet Take 20 mg by mouth daily.     pregabalin (LYRICA) 200 MG capsule TAKE (1) CAPSULE TWO TIMES DAILY. 180 capsule 1   rosuvastatin (CRESTOR) 20 MG tablet TAKE ONE TABLET BY MOUTH DAILY 90 tablet 2   sildenafil (VIAGRA) 100 MG tablet Take 0.5-1 tablets (50-100 mg total) by mouth daily as needed for erectile dysfunction. (Patient not taking: Reported on 06/10/2023) 5 tablet 11   tapentadol (NUCYNTA) 50 MG tablet Take 1 tablet (50 mg total) by mouth 3 (three) times daily. 90 tablet 0   tirzepatide (MOUNJARO) 5 MG/0.5ML Pen Inject 5 mg into the skin once a week. (Patient not taking: Reported on 06/10/2023) 6 mL 1   TRESIBA FLEXTOUCH 200 UNIT/ML FlexTouch Pen INJECT 74 UNITS UNDER THE SKIN EVERY DAY 36 mL 3   Current Facility-Administered Medications   Medication Dose Route Frequency Provider Last Rate Last Admin   dextrose 5 % solution   Intravenous Once Frank Pitter III, MD       dextrose 50 % solution 25 mL  25 mL Intravenous Once Frank Pitter III, MD        Allergies  Allergen Reactions   Codeine Itching    REACTION: Itching   Influenza Vaccines     Due to hx of GBS   Metformin And Related Other (See Comments)    GI  upset, diarrhea   Other Other (See Comments)    Due to hx of GBS- vaccines    Shellfish Allergy      REVIEW OF SYSTEMS:  [X]  denotes positive finding, [ ]  denotes negative finding Cardiac  Comments:  Chest pain or chest pressure:    Shortness of breath upon exertion:    Short of breath when lying flat:    Irregular heart rhythm:        Vascular    Pain in calf, thigh, or hip brought on by ambulation:    Pain in feet at night that wakes you up from your sleep:     Blood clot in your veins:    Leg swelling:         Pulmonary    Oxygen at home:    Productive cough:     Wheezing:         Neurologic    Sudden weakness in arms or legs:     Sudden numbness in arms or legs:     Sudden onset of difficulty speaking or slurred speech:    Temporary loss of vision in one eye:     Problems with dizziness:         Gastrointestinal    Blood in stool:     Vomited blood:         Genitourinary    Burning when urinating:     Blood in urine:        Psychiatric    Major depression:         Hematologic    Bleeding problems:    Problems with blood clotting too easily:        Skin    Rashes or ulcers:        Constitutional    Fever or chills:      PHYSICAL EXAMINATION:  There were no vitals filed for this visit.  General:  WDWN in NAD; vital signs documented above Gait: Not observed HENT: WNL, normocephalic Pulmonary: normal non-labored breathing , without wheezing Cardiac: regular HR Abdomen: soft, NT, no masses Skin: without rashes Vascular Exam/Pulses:  Right Left  Radial 2+ (normal)  2+ (normal)  Ulnar    Femoral    Popliteal    DP 2+ (normal) 2+ (normal)  PT     Extremities: without ischemic changes, without Gangrene , without cellulitis; without open wounds;  Musculoskeletal: no muscle wasting or atrophy  Neurologic: A&O X 3;  No focal weakness or paresthesias are detected Psychiatric:  The pt has Normal affect.   Non-Invasive Vascular Imaging:   ABI Findings:  +---------+------------------+-----+---------+--------+  Right   Rt Pressure (mmHg)IndexWaveform Comment   +---------+------------------+-----+---------+--------+  Brachial 137                                       +---------+------------------+-----+---------+--------+  ATA     146               1.07 triphasic          +---------+------------------+-----+---------+--------+  PTA     169               1.23 triphasic          +---------+------------------+-----+---------+--------+  Great Toe105               0.77                    +---------+------------------+-----+---------+--------+   +---------+------------------+-----+---------+-------+  Left    Lt Pressure (mmHg)IndexWaveform Comment  +---------+------------------+-----+---------+-------+  Brachial 137                                      +---------+------------------+-----+---------+-------+  ATA     140               1.02 triphasic         +---------+------------------+-----+---------+-------+  PTA     172               1.26 triphasic         +---------+------------------+-----+---------+-------+  Great Toe105               0.77                   +---------+------------------+-----+---------+-------+   +-------+-----------+-----------+------------+------------+  ABI/TBIToday's ABIToday's TBIPrevious ABIPrevious TBI  +-------+-----------+-----------+------------+------------+  Right 1.23       0.77                                  +-------+-----------+-----------+------------+------------+  Left  1.26       0.77                                 +-------+-----------+-----------+------------+------------+           ASSESSMENT/PLAN: Avondre Flanary is a 67 y.o. male presenting with concern for peripheral arterial disease.  On physical exam, he had palpable dorsalis pedis pulses bilaterally ABI was reviewed demonstrating triphasic waveforms and normal toe pressures.  His study was normal.  I had a long conversation with Frank [name] and his partner regarding the importance of podiatric care in the setting of longstanding diabetes.  We discussed wearing shoes in the house, as well as making a habit of assessing his feet on a daily basis.  Qwanell can follow-up with me as needed this point.    Victorino Sparrow, MD [facility] [phone]

## 2023-07-16 ENCOUNTER — Encounter: Payer: Self-pay | Admitting: Vascular Surgery

## 2023-07-16 ENCOUNTER — Ambulatory Visit (HOSPITAL_COMMUNITY)
Admission: RE | Admit: 2023-07-16 | Discharge: 2023-07-16 | Disposition: A | Discharge status: Home / Self Care | Payer: Medicare PPO | Source: Ambulatory Visit | Attending: Vascular Surgery | Admitting: Vascular Surgery

## 2023-07-16 ENCOUNTER — Ambulatory Visit: Payer: Medicare PPO | Admitting: Vascular Surgery

## 2023-07-16 VITALS — BP 127/78 | HR 90 | Temp 98.4°F | Resp 20 | Ht 69.0 in | Wt 225.0 lb

## 2023-07-16 DIAGNOSIS — G609 Hereditary and idiopathic neuropathy, unspecified: Secondary | ICD-10-CM | POA: Diagnosis not present

## 2023-07-16 DIAGNOSIS — R0989 Other specified symptoms and signs involving the circulatory and respiratory systems: Secondary | ICD-10-CM | POA: Diagnosis not present

## 2023-07-16 DIAGNOSIS — R209 Unspecified disturbances of skin sensation: Secondary | ICD-10-CM

## 2023-07-16 DIAGNOSIS — E0840 Diabetes mellitus due to underlying condition with diabetic neuropathy, unspecified: Secondary | ICD-10-CM

## 2023-07-16 DIAGNOSIS — E1140 Type 2 diabetes mellitus with diabetic neuropathy, unspecified: Secondary | ICD-10-CM | POA: Diagnosis not present

## 2023-07-16 LAB — VAS US ABI WITH/WO TBI
Left ABI: 1.26
Right ABI: 1.23

## 2023-07-20 ENCOUNTER — Telehealth: Payer: Self-pay | Admitting: Neurology

## 2023-07-20 NOTE — Telephone Encounter (Signed)
Pt called to confirm his next appointment, information provided.

## 2023-07-27 ENCOUNTER — Other Ambulatory Visit: Payer: Self-pay | Admitting: Neurology

## 2023-07-27 DIAGNOSIS — E1142 Type 2 diabetes mellitus with diabetic polyneuropathy: Secondary | ICD-10-CM

## 2023-07-27 MED ORDER — TAPENTADOL HCL 50 MG PO TABS: 50.0000 mg | ORAL_TABLET | Freq: Three times a day (TID) | ORAL | 0 refills | Status: DC

## 2023-07-27 NOTE — Telephone Encounter (Signed)
Pt last received 21 days supply #63 Nucynta 07-07-2023.  Last seen 06-10-2023. Next appt 01-07-2024 with AL/NP.

## 2023-07-27 NOTE — Telephone Encounter (Signed)
Pt called needing a refill on his  tapentadol (NUCYNTA) 50 MG tablet Pt states that he is calling in early due to last time the pharmacy not having enough to fill full prescription.

## 2023-07-28 MED ORDER — TAPENTADOL HCL 50 MG PO TABS: 50.0000 mg | ORAL_TABLET | Freq: Three times a day (TID) | ORAL | 0 refills | Status: DC

## 2023-07-28 NOTE — Telephone Encounter (Signed)
Pt's husband states per pharmacy a new rx for tapentadol (NUCYNTA) 50 MG tablet  needs to be sent to them: [phone] Due to it being sent to the wrong pharmacy the first time and the current rx cannot be canceled or transferred because of it being a C2 medication. Requesting call back

## 2023-07-28 NOTE — Addendum Note (Signed)
Addended by: Guy Begin on: 07/28/2023 04:54 PM   Modules accepted: Orders

## 2023-07-30 NOTE — Telephone Encounter (Signed)
I called and cancelled the [facility] pharmacy script as sent to local pharmacy.

## 2023-08-19 ENCOUNTER — Other Ambulatory Visit: Payer: Self-pay | Admitting: Internal Medicine

## 2023-08-21 ENCOUNTER — Other Ambulatory Visit: Payer: Self-pay | Admitting: Internal Medicine

## 2023-08-21 ENCOUNTER — Other Ambulatory Visit: Payer: Self-pay

## 2023-08-21 ENCOUNTER — Other Ambulatory Visit: Payer: Self-pay | Admitting: Neurology

## 2023-08-21 NOTE — Telephone Encounter (Signed)
 Requested Prescriptions   Pending Prescriptions Disp Refills   cyclobenzaprine  (FLEXERIL ) 5 MG tablet [Pharmacy Med Name: Cyclobenzaprine  HCl Oral Tablet 5 MG] 90 tablet 3    Sig: TAKE 1 TABLET AT BEDTIME   DULoxetine  (CYMBALTA ) 60 MG capsule [Pharmacy Med Name: DULoxetine  HCl Oral Capsule Delayed Release Particles 60 MG] 180 capsule 3    Sig: TAKE 1 CAPSULE TWICE DAILY   ARIPiprazole  (ABILIFY ) 10 MG tablet [Pharmacy Med Name: ARIPiprazole  Oral Tablet 10 MG] 90 tablet 3    Sig: TAKE 1 TABLET EVERY DAY   pregabalin  (LYRICA ) 200 MG capsule [Pharmacy Med Name: Pregabalin  Oral Capsule 200 MG] 180 capsule     Sig: TAKE 1 CAPSULE TWICE DAILY   Last seen 06/10/23, next appt scheduled for 08/31/23 Dispenses   Dispensed Days Supply Quantity Provider Pharmacy  PREGABALIN  200 MG CAPSULE 03/16/2023 90 180 each Lomax, Amy, NP [id number]  PREGABALIN  200 MG CAPSULE 12/09/2022 90 180 each Lomax, Amy, NP [id number]  pregabalin  200 mg capsule 08/29/2022 90 180 capsule Sater, Charlie LABOR, MD [facility]...      How do dispenses affect the score?

## 2023-08-24 ENCOUNTER — Other Ambulatory Visit: Payer: Self-pay | Admitting: Internal Medicine

## 2023-08-24 ENCOUNTER — Telehealth: Payer: Self-pay

## 2023-08-24 ENCOUNTER — Other Ambulatory Visit: Payer: Self-pay | Admitting: Neurology

## 2023-08-24 DIAGNOSIS — E1142 Type 2 diabetes mellitus with diabetic polyneuropathy: Secondary | ICD-10-CM

## 2023-08-24 MED ORDER — TAPENTADOL HCL 50 MG PO TABS: 50.0000 mg | ORAL_TABLET | Freq: Three times a day (TID) | ORAL | 0 refills | Status: DC

## 2023-08-24 NOTE — Telephone Encounter (Signed)
 Last seen 06/09/24 and next f/u 01/07/24. Last refilled 07/28/23 #90.

## 2023-08-24 NOTE — Telephone Encounter (Signed)
 Pt is requesting a refill for  tapentadol (NUCYNTA) 50 MG tablet .  [facility] ID: [phone]

## 2023-08-25 ENCOUNTER — Telehealth: Payer: Self-pay

## 2023-08-25 ENCOUNTER — Other Ambulatory Visit (HOSPITAL_COMMUNITY): Payer: Self-pay

## 2023-08-25 NOTE — Telephone Encounter (Signed)
 Pharmacy Patient Advocate Encounter   Received notification from Pt Calls Messages that prior authorization for Mounjaro  5mg /0.32ml is required/requested.   Insurance verification completed.   The patient is insured through [employer] .   Per test claim: PA required; PA submitted to above mentioned insurance via CoverMyMeds Key/confirmation #/EOC [id number] Status is pending

## 2023-08-25 NOTE — Telephone Encounter (Signed)
 Marland Kitchen

## 2023-08-25 NOTE — Telephone Encounter (Signed)
 Pharmacy Patient Advocate Encounter  Received notification from [facility] that Prior Authorization for Mounjaro 5mg /0.50ml has been APPROVED from 08/12/23 to 08/10/24

## 2023-08-31 ENCOUNTER — Encounter: Payer: Medicare PPO | Admitting: Neurology

## 2023-09-03 ENCOUNTER — Encounter: Payer: Self-pay | Admitting: "Endocrinology

## 2023-09-03 ENCOUNTER — Ambulatory Visit: Payer: Medicare PPO | Admitting: "Endocrinology

## 2023-09-03 VITALS — BP 118/60 | HR 81 | Resp 20 | Ht 69.0 in | Wt 229.4 lb

## 2023-09-03 DIAGNOSIS — Z794 Long term (current) use of insulin: Secondary | ICD-10-CM | POA: Diagnosis not present

## 2023-09-03 DIAGNOSIS — Z7985 Long-term (current) use of injectable non-insulin antidiabetic drugs: Secondary | ICD-10-CM | POA: Diagnosis not present

## 2023-09-03 DIAGNOSIS — E782 Mixed hyperlipidemia: Secondary | ICD-10-CM

## 2023-09-03 DIAGNOSIS — E1165 Type 2 diabetes mellitus with hyperglycemia: Secondary | ICD-10-CM

## 2023-09-03 DIAGNOSIS — Z7984 Long term (current) use of oral hypoglycemic drugs: Secondary | ICD-10-CM | POA: Diagnosis not present

## 2023-09-03 LAB — POCT GLYCOSYLATED HEMOGLOBIN (HGB A1C): Hemoglobin A1C: 8.4 % — AB (ref 4.0–5.6)

## 2023-09-03 MED ORDER — DAPAGLIFLOZIN PROPANEDIOL 10 MG PO TABS: 10.0000 mg | ORAL_TABLET | Freq: Every day | ORAL | 1 refills | Status: DC

## 2023-09-03 MED ORDER — GLIPIZIDE ER 10 MG PO TB24: 10.0000 mg | ORAL_TABLET | Freq: Every day | ORAL | 1 refills | Status: DC

## 2023-09-03 MED ORDER — TIRZEPATIDE 7.5 MG/0.5ML ~~LOC~~ SOAJ: 7.5000 mg | SUBCUTANEOUS | 0 refills | Status: DC

## 2023-09-03 NOTE — Progress Notes (Signed)
Outpatient Endocrinology Note [place], MD  09/03/23   Frank [name] 09-19-1955 [id number]  Referring Provider: Corwin Levins, MD Primary Care Provider: Corwin Levins, MD Reason for consultation: Subjective   Assessment & Plan  Diagnoses and all orders for this visit:  Uncontrolled type 2 diabetes mellitus with hyperglycemia, with long-term current use of insulin (HCC) -     POCT glycosylated hemoglobin (Hb A1C) -     tirzepatide (MOUNJARO) 7.5 MG/0.5ML Pen; Inject 7.5 mg into the skin once a week. -     glipiZIDE (GLUCOTROL XL) 10 MG 24 hr tablet; Take 1 tablet (10 mg total) by mouth daily with breakfast. -     dapagliflozin propanediol (FARXIGA) 10 MG TABS tablet; Take 1 tablet (10 mg total) by mouth daily before breakfast. -     Comprehensive metabolic panel -     Lipid panel -     Microalbumin / creatinine urine ratio  Long term (current) use of oral hypoglycemic drugs  Long-term (current) use of injectable non-insulin antidiabetic drugs  Long-term insulin use (HCC)  Mixed hypercholesterolemia and hypertriglyceridemia    Diabetes Type 2 complicated by neuropathy Lab Results  Component Value Date   GFR 67.96 09/12/2022   Hba1c goal less than 7, current Hba1c is  Lab Results  Component Value Date   HGBA1C 8.4 (A) 09/03/2023   Will recommend the following: Farxiga 10 mg daily Glipizide XL 10 mg once every morning Mounjaro 7.5 mg weekly  Tresiba 76 units daily     Not interested in mealtime insulin  Seen vascular 07/2023: normal pulses/ABI Discussed about lifestyle changes   Side effects from medications: Diarrhea from metformin ER Stopped Ozempic 2 mg weekly to help with weight loss  No known contraindications to any of above medications  -Last LD and Tg are as follows: Lab Results  Component Value Date   LDLCALC 38 09/08/2022    Lab Results  Component Value Date   TRIG 177.0 (H) 09/08/2022   -Recommend rosuvastatin 20 mg  QD -Follow low fat diet and exercise   -Blood pressure goal <140/90 - Microalbumin/creatinine goal < 30 -Last MA/Cr is as follows: Lab Results  Component Value Date   MICROALBUR 0.8 09/08/2022   -not on ACE/ARB  -diet changes including salt restriction -limit eating outside -counseled BP targets per standards of diabetes care -uncontrolled blood pressure can lead to retinopathy, nephropathy and cardiovascular and atherosclerotic heart disease  Reviewed and counseled on: -A1C target -Blood sugar targets -Complications of uncontrolled diabetes  -Checking blood sugar before meals and bedtime and bring log next visit -All medications with mechanism of action and side effects -Hypoglycemia management: rule of 15's, Glucagon Emergency Kit and medical alert ID -low-carb low-fat plate-method diet -At least 20 minutes of physical activity per day -Annual dilated retinal eye exam and foot exam -compliance and follow up needs -follow up as scheduled or earlier if problem gets worse  Call if blood sugar is less than 70 or consistently above 250    Take a 15 gm snack of carbohydrate at bedtime before you go to sleep if your blood sugar is less than 100.    If you are going to fast after midnight for a test or procedure, ask your physician for instructions on how to reduce/decrease your insulin dose.    Call if blood sugar is less than 70 or consistently above 250  -Treating a low sugar by rule of 15  (15 gms of sugar every  15 min until sugar is more than 70) If you feel your sugar is low, test your sugar to be sure If your sugar is low (less than 70), then take 15 grams of a fast acting Carbohydrate (3-4 glucose tablets or glucose gel or 4 ounces of juice or regular soda) Recheck your sugar 15 min after treating low to make sure it is more than 70 If sugar is still less than 70, treat again with 15 grams of carbohydrate          Don't drive the hour of hypoglycemia  If  unconscious/unable to eat or drink by mouth, use glucagon injection or nasal spray baqsimi and call 911. Can repeat again in 15 min if still unconscious.  Return in about 7 weeks (around 10/22/2023) for visit and 8 am labs before next visit.   I have reviewed current medications, nurse's notes, allergies, vital signs, past medical and surgical history, family medical history, and social history for this encounter. Counseled patient on symptoms, examination findings, lab findings, imaging results, treatment decisions and monitoring and prognosis. The patient understood the recommendations and agrees with the treatment plan. All questions regarding treatment plan were fully answered.  Frank [place], MD  09/03/23    History of Present Illness Frank [name] is a 68 y.o. year old male who presents for evaluation of Type 2 diabetes mellitus.  Sevon Guzzardi was first diagnosed in 2012.   Diabetes education +  Home diabetes regimen: Non-insulin hypoglycemic drugs: Farxiga 10 mg daily, Ozempic 2 mg weekly, glipizide XL 2.5 mg daily    Insulin regimen: Tresiba 70 units daily           Side effects from medications: Diarrhea from metformin ER  Previous history:  His A1c has ranged between 7 and 10.8 since 2018 Farxiga was started in 2022, was on Ozempic since 7/22, previously he took Trulicity for a year  COMPLICATIONS -  MI/Stroke -  retinopathy +  neuropathy -  nephropathy  BLOOD SUGAR DATA  CGM interpretation: At today's visit, we reviewed her CGM downloads. The full report is scanned in the media. Reviewing the CGM trends, BG are well controlled throughout the day.    Physical Exam  BP 118/60 (BP Location: Left Arm, Patient Position: Sitting, Cuff Size: Large)   Pulse 81   Resp 20   Ht 5\' 9"  (1.753 m)   Wt 229 lb 6.4 oz (104.1 kg)   SpO2 98%   BMI 33.88 kg/m    Constitutional: well developed, well nourished Head: normocephalic, atraumatic Eyes: sclera  anicteric, no redness Neck: supple Lungs: normal respiratory effort Neurology: alert and oriented Skin: dry, no appreciable rashes Musculoskeletal: no appreciable defects Psychiatric: normal mood and affect Diabetic Foot Exam - Simple   No data filed      Current Medications Patient's Medications  New Prescriptions   GLIPIZIDE (GLUCOTROL XL) 10 MG 24 HR TABLET    Take 1 tablet (10 mg total) by mouth daily with breakfast.   TIRZEPATIDE (MOUNJARO) 7.5 MG/0.5ML PEN    Inject 7.5 mg into the skin once a week.  Previous Medications   ACCU-CHEK SOFTCLIX LANCETS LANCETS    Use to check blood sugars daily   ARIPIPRAZOLE (ABILIFY) 10 MG TABLET    TAKE 1 TABLET EVERY DAY   CONTINUOUS BLOOD GLUC SENSOR (DEXCOM G7 SENSOR) MISC    Change every 10 days   CYCLOBENZAPRINE (FLEXERIL) 5 MG TABLET    TAKE 1 TABLET AT BEDTIME  DROPLET PEN NEEDLES 31G X 8 MM MISC    USE TO INJECT TRESIBA UNDER THE SKIN EVERY DAY AS DIRECTED   DULOXETINE (CYMBALTA) 60 MG CAPSULE    TAKE 1 CAPSULE TWICE DAILY   GLUCOSE BLOOD (ACCU-CHEK GUIDE) TEST STRIP    Use to check blood sugars daily   HYDROCHLOROTHIAZIDE (MICROZIDE) 12.5 MG CAPSULE    TAKE ONE CAPSULE BY MOUTH DAILY   LORAZEPAM (ATIVAN) 0.5 MG TABLET    TAKE 1 TABLET BY MOUTH TWICE DAILY AS NEEDED   LORAZEPAM (ATIVAN) 1 MG TABLET    TAKE 1 TABLET BY MOUTH TWICE A DAY AS NEEDED   METHYLPHENIDATE (CONCERTA) 36 MG PO CR TABLET    Take 1 tablet (36 mg total) by mouth daily.   OMEPRAZOLE (PRILOSEC OTC) 20 MG TABLET    Take 20 mg by mouth daily.   PREGABALIN (LYRICA) 200 MG CAPSULE    TAKE 1 CAPSULE TWICE DAILY   ROSUVASTATIN (CRESTOR) 20 MG TABLET    TAKE ONE TABLET BY MOUTH DAILY   SILDENAFIL (VIAGRA) 100 MG TABLET    Take 0.5-1 tablets (50-100 mg total) by mouth daily as needed for erectile dysfunction.   TAPENTADOL (NUCYNTA) 50 MG TABLET    Take 1 tablet (50 mg total) by mouth 3 (three) times daily.   TRESIBA FLEXTOUCH 200 UNIT/ML FLEXTOUCH PEN    INJECT 74 UNITS  UNDER THE SKIN EVERY DAY  Modified Medications   Modified Medication Previous Medication   DAPAGLIFLOZIN PROPANEDIOL (FARXIGA) 10 MG TABS TABLET dapagliflozin propanediol (FARXIGA) 10 MG TABS tablet      Take 1 tablet (10 mg total) by mouth daily before breakfast.    Take 1 tablet (10 mg total) by mouth daily before breakfast.  Discontinued Medications   GLIPIZIDE (GLUCOTROL XL) 2.5 MG 24 HR TABLET    TAKE 1 TABLET EVERY DAY WITH BREAKFAST   TIRZEPATIDE (MOUNJARO) 5 MG/0.5ML PEN    Inject 5 mg into the skin once a week.    Allergies Allergies  Allergen Reactions   Codeine Itching    REACTION: Itching   Influenza Vaccines     Due to hx of GBS   Metformin And Related Other (See Comments)    GI upset, diarrhea   Other Other (See Comments)    Due to hx of GBS- vaccines    Shellfish Allergy     Past Medical History Past Medical History:  Diagnosis Date   Abdominal pain, left lower quadrant 06/06/2010   Abdominal pain, unspecified site 01/19/2009   ADD 10/09/2008   ALLERGIC RHINITIS 10/09/2008   Allergy    ANXIETY DEPRESSION 02/01/2008   Arthritis    hands ?   Blood transfusion without reported diagnosis    Cataract    removed both eyes    COLONIC POLYPS 02/01/2008   DIABETES MELLITUS, TYPE II 05/20/2010   DYSPNEA 03/12/2010   ERECTILE DYSFUNCTION 10/09/2008   ERECTILE DYSFUNCTION, ORGANIC 05/20/2010   FOOT PAIN, LEFT 05/20/2010   GERD 02/01/2008   Guillain Barr syndrome (HCC)    HEMORRHOIDS 02/01/2008   HIATAL HERNIA 02/01/2008   HYPERLIPIDEMIA 10/09/2008   HYPERTENSION 10/09/2008   MORTON'S NEUROMA 05/20/2010   Other specified forms of hearing loss 06/27/2009   PERIPHERAL EDEMA 05/20/2010   PERIPHERAL NEUROPATHY 05/20/2010   Sleep apnea    stopped cpap    SLEEP APNEA, OBSTRUCTIVE 02/01/2008   Stricture and stenosis of esophagus 02/02/2008   Type II or unspecified type diabetes mellitus without mention of complication, uncontrolled  11/14/2010   WRIST PAIN, LEFT 12/05/2009    Past  Surgical History Past Surgical History:  Procedure Laterality Date   CARPAL TUNNEL RELEASE Bilateral    COLONOSCOPY     DENTAL SURGERY     ESOPHAGEAL DILATION  july 2009   ESOPHAGOGASTRODUODENOSCOPY N/A 06/27/2014   Procedure: ESOPHAGOGASTRODUODENOSCOPY (EGD);  Surgeon: Hart Carwin, MD;  Location: [facility];  Service: Endoscopy;  Laterality: N/A;   EYE SURGERY     catract surgery on both eyes   POLYPECTOMY     ROTATOR CUFF REPAIR     UPPER GASTROINTESTINAL ENDOSCOPY      Family History family history includes Depression in his mother; Diabetes in his brother and mother; Heart disease in his mother; Hyperlipidemia in his mother.  Social History Social History   Socioeconomic History   Marital status: Single    Spouse name: Not on file   Number of children: Not on file   Years of education: Not on file   Highest education level: Not on file  Occupational History   Occupation: Housekeeper [employer]    Employer: [employer]  Tobacco Use   Smoking status: Never   Smokeless tobacco: Never  Vaping Use   Vaping status: Never Used  Substance and Sexual Activity   Alcohol use: Yes    Comment: occasional   Drug use: Yes    Types: Methaqualone   Sexual activity: Yes    Comment: before I got sick  Other Topics Concern   Not on file  Social History Narrative   Not on file   Social Drivers of Health   Financial Resource Strain: Not on file  Food Insecurity: Not on file  Transportation Needs: Not on file  Physical Activity: Not on file  Stress: Not on file  Social Connections: Not on file  Intimate Partner Violence: Not on file    Lab Results  Component Value Date   HGBA1C 8.4 (A) 09/03/2023   HGBA1C 8.2 (A) 06/03/2023   HGBA1C 6.5 (A) 01/28/2023   Lab Results  Component Value Date   CHOL 117 09/08/2022   Lab Results  Component Value Date   HDL 44.20 09/08/2022   Lab Results  Component Value Date   LDLCALC 38 09/08/2022   Lab Results  Component Value  Date   TRIG 177.0 (H) 09/08/2022   Lab Results  Component Value Date   CHOLHDL 3 09/08/2022   Lab Results  Component Value Date   CREATININE 1.12 11/02/2022   Lab Results  Component Value Date   GFR 67.96 09/12/2022   Lab Results  Component Value Date   MICROALBUR 0.8 09/08/2022      Component Value Date/Time   NA 136 11/02/2022 1856   K 3.8 11/02/2022 1856   CL 99 11/02/2022 1856   CO2 26 11/02/2022 1856   GLUCOSE 124 (H) 11/02/2022 1856   BUN 16 11/02/2022 1856   CREATININE 1.12 11/02/2022 1856   CALCIUM 9.4 11/02/2022 1856   PROT 6.9 06/10/2023 1028   ALBUMIN 4.6 11/02/2022 1856   AST 14 (L) 11/02/2022 1856   ALT 15 11/02/2022 1856   ALKPHOS 77 11/02/2022 1856   BILITOT 1.3 (H) 11/02/2022 1856   GFRNONAA >60 11/02/2022 1856   GFRAA >90 08/06/2014 0240      Latest Ref Rng & Units 11/02/2022    6:56 PM 09/12/2022   10:15 AM 09/08/2022   11:49 AM  BMP  Glucose 70 - 99 mg/dL 161  096  045  BUN 8 - 23 mg/dL 16  19  19    Creatinine 0.61 - 1.24 mg/dL 1.61  0.96  0.45   Sodium 135 - 145 mmol/L 136  138  140   Potassium 3.5 - 5.1 mmol/L 3.8  4.2  4.1   Chloride 98 - 111 mmol/L 99  98  100   CO2 22 - 32 mmol/L 26  32  30   Calcium 8.9 - 10.3 mg/dL 9.4  9.8  9.9        Component Value Date/Time   WBC 11.8 (H) 11/02/2022 1856   RBC 6.11 (H) 11/02/2022 1856   HGB 17.4 (H) 11/02/2022 1856   HCT 52.1 (H) 11/02/2022 1856   PLT 187 11/02/2022 1856   MCV 85.3 11/02/2022 1856   MCV 93.0 12/30/2013 1537   MCH 28.5 11/02/2022 1856   MCHC 33.4 11/02/2022 1856   RDW 14.3 11/02/2022 1856   LYMPHSABS 2.9 09/08/2022 1149   MONOABS 0.6 09/08/2022 1149   EOSABS 0.1 09/08/2022 1149   BASOSABS 0.1 09/08/2022 1149     Parts of this note may have been dictated using voice recognition software. There may be variances in spelling and vocabulary which are unintentional. Not all errors are proofread. Please notify the There[facility] Parkin if any discrepancies are noted or if the meaning of any  statement is not clear.

## 2023-09-03 NOTE — Patient Instructions (Signed)
Farxiga 10 mg daily Glipizide XL 10 mg once every morning Mounjaro 7.5 mg weekly  Tresiba 76 units daily  ____________________________   Goals of DM therapy:  Morning Fasting blood sugar: 80-140  Blood sugar before meals: 80-140 Bed time blood sugar: 100-150  A1C <7%, limited only by hypoglycemia  1.Diabetes medications and their side effects discussed, including hypoglycemia    2. Check blood glucose:  a) Always check blood sugars before driving. Please see below (under hypoglycemia) on how to manage b) Check a minimum of 3 times/day or more as needed when having symptoms of hypoglycemia.   c) Try to check blood glucose before sleeping/in the middle of the night to ensure that it is remaining stable and not dropping less than 100 d) Check blood glucose more often if sick  3. Diet: a) 3 meals per day schedule b: Restrict carbs to 60-70 grams (4 servings) per meal c) Colorful vegetables - 3 servings a day, and low sugar fruit 2 servings/day Plate control method: 1/4 plate protein, 1/4 starch, 1/2 green, yellow, or red vegetables d) Avoid carbohydrate snacks unless hypoglycemic episode, or increased physical activity  4. Regular exercise as tolerated, preferably 3 or more hours a week  5. Hypoglycemia: a)  Do not drive or operate machinery without first testing blood glucose to assure it is over 90 mg%, or if dizzy, lightheaded, not feeling normal, etc, or  if foot or leg is numb or weak. b)  If blood glucose less than 70, take four 5gm Glucose tabs or 15-30 gm Glucose gel.  Repeat every 15 min as needed until blood sugar is >100 mg/dl. If hypoglycemia persists then call 911.   6. Sick day management: a) Check blood glucose more often b) Continue usual therapy if blood sugars are elevated.   7. Contact the doctor immediately if blood glucose is frequently <60 mg/dl, or an episode of severe hypoglycemia occurs (where someone had to give you glucose/  glucagon or if you passed  out from a low blood glucose), or if blood glucose is persistently >350 mg/dl, for further management  8. A change in level of physical activity or exercise and a change in diet may also affect your blood sugar. Check blood sugars more often and call if needed.  Instructions: 1. Bring glucose meter, blood glucose records on every visit for review 2. Continue to follow up with primary care physician and other providers for medical care 3. Yearly eye  and foot exam 4. Please get blood work done prior to the next appointment

## 2023-09-07 ENCOUNTER — Other Ambulatory Visit (HOSPITAL_COMMUNITY): Payer: Self-pay

## 2023-09-07 ENCOUNTER — Ambulatory Visit: Payer: Medicare PPO | Admitting: Internal Medicine

## 2023-09-07 VITALS — BP 118/66 | HR 104 | Temp 98.3°F | Ht 69.0 in | Wt 225.5 lb

## 2023-09-07 DIAGNOSIS — E538 Deficiency of other specified B group vitamins: Secondary | ICD-10-CM

## 2023-09-07 DIAGNOSIS — R269 Unspecified abnormalities of gait and mobility: Secondary | ICD-10-CM | POA: Diagnosis not present

## 2023-09-07 DIAGNOSIS — E1140 Type 2 diabetes mellitus with diabetic neuropathy, unspecified: Secondary | ICD-10-CM

## 2023-09-07 DIAGNOSIS — K5900 Constipation, unspecified: Secondary | ICD-10-CM | POA: Diagnosis not present

## 2023-09-07 DIAGNOSIS — I10 Essential (primary) hypertension: Secondary | ICD-10-CM | POA: Diagnosis not present

## 2023-09-07 DIAGNOSIS — E559 Vitamin D deficiency, unspecified: Secondary | ICD-10-CM | POA: Diagnosis not present

## 2023-09-07 DIAGNOSIS — E0840 Diabetes mellitus due to underlying condition with diabetic neuropathy, unspecified: Secondary | ICD-10-CM

## 2023-09-07 DIAGNOSIS — E7800 Pure hypercholesterolemia, unspecified: Secondary | ICD-10-CM

## 2023-09-07 DIAGNOSIS — Z0001 Encounter for general adult medical examination with abnormal findings: Secondary | ICD-10-CM | POA: Diagnosis not present

## 2023-09-07 DIAGNOSIS — R531 Weakness: Secondary | ICD-10-CM | POA: Diagnosis not present

## 2023-09-07 DIAGNOSIS — Z125 Encounter for screening for malignant neoplasm of prostate: Secondary | ICD-10-CM | POA: Diagnosis not present

## 2023-09-07 LAB — CBC WITH DIFFERENTIAL/PLATELET
Basophils Absolute: 0 10*3/uL (ref 0.0–0.1)
Basophils Relative: 0.5 % (ref 0.0–3.0)
Eosinophils Absolute: 0.1 10*3/uL (ref 0.0–0.7)
Eosinophils Relative: 1.7 % (ref 0.0–5.0)
HCT: 47.6 % (ref 39.0–52.0)
Hemoglobin: 15.9 g/dL (ref 13.0–17.0)
Lymphocytes Relative: 32.6 % (ref 12.0–46.0)
Lymphs Abs: 2.5 10*3/uL (ref 0.7–4.0)
MCHC: 33.5 g/dL (ref 30.0–36.0)
MCV: 89.7 fL (ref 78.0–100.0)
Monocytes Absolute: 0.6 10*3/uL (ref 0.1–1.0)
Monocytes Relative: 8.3 % (ref 3.0–12.0)
Neutro Abs: 4.4 10*3/uL (ref 1.4–7.7)
Neutrophils Relative %: 56.9 % (ref 43.0–77.0)
Platelets: 193 10*3/uL (ref 150.0–400.0)
RBC: 5.31 Mil/uL (ref 4.22–5.81)
RDW: 14.2 % (ref 11.5–15.5)
WBC: 7.8 10*3/uL (ref 4.0–10.5)

## 2023-09-07 LAB — MICROALBUMIN / CREATININE URINE RATIO
Creatinine,U: 186 mg/dL
Microalb Creat Ratio: 0.9 mg/g (ref 0.0–30.0)
Microalb, Ur: 1.7 mg/dL (ref 0.0–1.9)

## 2023-09-07 LAB — VITAMIN D 25 HYDROXY (VIT D DEFICIENCY, FRACTURES): VITD: 66.71 ng/mL (ref 30.00–100.00)

## 2023-09-07 LAB — URINALYSIS, ROUTINE W REFLEX MICROSCOPIC
Bilirubin Urine: NEGATIVE
Hgb urine dipstick: NEGATIVE
Leukocytes,Ua: NEGATIVE
Nitrite: NEGATIVE
RBC / HPF: NONE SEEN (ref 0–?)
Specific Gravity, Urine: 1.025 (ref 1.000–1.030)
Total Protein, Urine: NEGATIVE
Urine Glucose: >=1000 — AB
Urobilinogen, UA: 2 — AB (ref 0.0–1.0)
pH: 6 (ref 5.0–8.0)

## 2023-09-07 LAB — HEPATIC FUNCTION PANEL
ALT: 17 U/L (ref 0–53)
AST: 21 U/L (ref 0–37)
Albumin: 4.4 g/dL (ref 3.5–5.2)
Alkaline Phosphatase: 81 U/L (ref 39–117)
Bilirubin, Direct: 0.2 mg/dL (ref 0.0–0.3)
Total Bilirubin: 0.7 mg/dL (ref 0.2–1.2)
Total Protein: 7.1 g/dL (ref 6.0–8.3)

## 2023-09-07 LAB — LIPID PANEL
Cholesterol: 121 mg/dL (ref 0–200)
HDL: 44.6 mg/dL (ref >39.00)
LDL Cholesterol: 44 mg/dL (ref 0–99)
NonHDL: 76.34
Total CHOL/HDL Ratio: 3
Triglycerides: 162 mg/dL — ABNORMAL HIGH (ref 0.0–149.0)
VLDL: 32.4 mg/dL (ref 0.0–40.0)

## 2023-09-07 LAB — BASIC METABOLIC PANEL
BUN: 14 mg/dL (ref 6–23)
CO2: 30 meq/L (ref 19–32)
Calcium: 9.3 mg/dL (ref 8.4–10.5)
Chloride: 101 meq/L (ref 96–112)
Creatinine, Ser: 1.14 mg/dL (ref 0.40–1.50)
GFR: 65.36 mL/min (ref >60.00)
Glucose, Bld: 78 mg/dL (ref 70–99)
Potassium: 4.2 meq/L (ref 3.5–5.1)
Sodium: 140 meq/L (ref 135–145)

## 2023-09-07 LAB — VITAMIN B12: Vitamin B-12: 293 pg/mL (ref 211–911)

## 2023-09-07 LAB — PSA: PSA: 0.43 ng/mL (ref 0.10–4.00)

## 2023-09-07 LAB — TSH: TSH: 1.81 u[IU]/mL (ref 0.35–5.50)

## 2023-09-07 MED ORDER — METHYLPHENIDATE HCL ER (OSM) 36 MG PO TBCR: 36.0000 mg | EXTENDED_RELEASE_TABLET | Freq: Every day | ORAL | 0 refills | Status: DC

## 2023-09-07 NOTE — Patient Instructions (Signed)
Ok to take the Miralax daily as needed for constipation  Please continue all other medications as before, and refills have been done for the concerta  Please have the pharmacy call with any other refills you may need.  Please continue your efforts at being more active, low cholesterol diet, and weight control.  You are otherwise up to date with prevention measures today.  Please keep your appointments with your specialists as you may have planned  You will be contacted regarding the referral for: Physical Therapy  Please go to the LAB at the blood drawing area for the tests to be done  You will be contacted by phone if any changes need to be made immediately.  Otherwise, you will receive a letter about your results with an explanation, but please check with MyChart first.  Please make an Appointment to return in 6 months, or sooner if needed

## 2023-09-07 NOTE — Progress Notes (Unsigned)
Patient ID: Frank [name], male   DOB: [birth year], 68 y.o.   MRN: [id number]         Chief Complaint:: wellness exam and constipation, generalized weakness and gait difficulty, low b12, dm, htn, hld       HPI:  Frank [name] is a 68 y.o. male here for wellness exam; declines tdap, prevnar 20, shingrix o/w up to date                        Also Pt denies chest pain, increased sob or doe, wheezing, orthopnea, PND, increased LE swelling, palpitations, dizziness or syncope.   Pt denies polydipsia, polyuria, or new focal neuro s/s.    Pt denies fever, wt loss, night sweats, loss of appetite, or other constitutional symptoms  Does have occasional worsening mild intermittent constipation, generalized weakness and gait difficulty, near falls.     Wt Readings from Last 3 Encounters:  09/07/23 225 lb 8 oz (102.3 kg)  09/03/23 229 lb 6.4 oz (104.1 kg)  07/16/23 225 lb (102.1 kg)   BP Readings from Last 3 Encounters:  09/08/23 137/85  09/07/23 118/66  09/03/23 118/60   Immunization History  Administered Date(s) Administered   Influenza Split 06/04/2012   Influenza Whole 05/20/2010   Influenza,inj,Quad PF,6+ Mos 05/05/2013, 05/17/2014   PFIZER(Purple Top)SARS-COV-2 Vaccination 11/21/2019, 12/19/2019, 07/17/2020, 05/24/2021   Pfizer(Comirnaty)Fall Seasonal Vaccine 12 years and older 04/21/2023   Pneumococcal Conjugate-13 05/31/2014   Pneumococcal Polysaccharide-23 05/20/2010   Td 08/11/2004   Health Maintenance Due  Topic Date Due   Medicare Annual Wellness (AWV)  Never done   Zoster Vaccines- Shingrix (1 of 2) Never done   DTaP/Tdap/Td (2 - Tdap) 08/11/2014   Pneumonia Vaccine 14+ Years old (3 of 3 - PPSV23 or PCV20) 08/11/2018      Past Medical History:  Diagnosis Date   Abdominal pain, left lower quadrant 06/06/2010   Abdominal pain, unspecified site 01/19/2009   ADD 10/09/2008   ALLERGIC RHINITIS 10/09/2008   Allergy    ANXIETY DEPRESSION 02/01/2008   Arthritis    hands  ?   Blood transfusion without reported diagnosis    Cataract    removed both eyes    COLONIC POLYPS 02/01/2008   DIABETES MELLITUS, TYPE II 05/20/2010   DYSPNEA 03/12/2010   ERECTILE DYSFUNCTION 10/09/2008   ERECTILE DYSFUNCTION, ORGANIC 05/20/2010   FOOT PAIN, LEFT 05/20/2010   GERD 02/01/2008   Guillain Barr syndrome (HCC)    HEMORRHOIDS 02/01/2008   HIATAL HERNIA 02/01/2008   HYPERLIPIDEMIA 10/09/2008   HYPERTENSION 10/09/2008   MORTON'S NEUROMA 05/20/2010   Other specified forms of hearing loss 06/27/2009   PERIPHERAL EDEMA 05/20/2010   PERIPHERAL NEUROPATHY 05/20/2010   Sleep apnea    stopped cpap    SLEEP APNEA, OBSTRUCTIVE 02/01/2008   Stricture and stenosis of esophagus 02/02/2008   Type II or unspecified type diabetes mellitus without mention of complication, uncontrolled 11/14/2010   WRIST PAIN, LEFT 12/05/2009   Past Surgical History:  Procedure Laterality Date   CARPAL TUNNEL RELEASE Bilateral    COLONOSCOPY     DENTAL SURGERY     ESOPHAGEAL DILATION  july 2009   ESOPHAGOGASTRODUODENOSCOPY N/A 06/27/2014   Procedure: ESOPHAGOGASTRODUODENOSCOPY (EGD);  Surgeon: Hart Carwin, MD;  Location: [facility];  Service: Endoscopy;  Laterality: N/A;   EYE SURGERY     catract surgery on both eyes   POLYPECTOMY     ROTATOR CUFF REPAIR     UPPER  GASTROINTESTINAL ENDOSCOPY      reports that he has never smoked. He has never used smokeless tobacco. He reports current alcohol use. He reports current drug use. Drug: Methaqualone. family history includes Depression in his mother; Diabetes in his brother and mother; Heart disease in his mother; Hyperlipidemia in his mother. Allergies  Allergen Reactions   Codeine Itching    REACTION: Itching   Influenza Vaccines     Due to hx of GBS   Metformin And Related Other (See Comments)    GI upset, diarrhea   Other Other (See Comments)    Due to hx of GBS- vaccines    Shellfish Allergy    Current Outpatient Medications on File Prior to Visit   Medication Sig Dispense Refill   Accu-Chek Softclix Lancets lancets Use to check blood sugars daily 100 each 11   ARIPiprazole (ABILIFY) 10 MG tablet TAKE 1 TABLET EVERY DAY 90 tablet 3   Continuous Blood Gluc Sensor (DEXCOM G7 SENSOR) MISC Change every 10 days 3 each 2   cyclobenzaprine (FLEXERIL) 5 MG tablet TAKE 1 TABLET AT BEDTIME 90 tablet 3   dapagliflozin propanediol (FARXIGA) 10 MG TABS tablet Take 1 tablet (10 mg total) by mouth daily before breakfast. 90 tablet 1   DROPLET PEN NEEDLES 31G X 8 MM MISC USE TO INJECT TRESIBA UNDER THE SKIN EVERY DAY AS DIRECTED 100 each 3   DULoxetine (CYMBALTA) 60 MG capsule TAKE 1 CAPSULE TWICE DAILY 180 capsule 3   glipiZIDE (GLUCOTROL XL) 10 MG 24 hr tablet Take 1 tablet (10 mg total) by mouth daily with breakfast. 90 tablet 1   glucose blood (ACCU-CHEK GUIDE) test strip Use to check blood sugars daily 100 strip 11   hydrochlorothiazide (MICROZIDE) 12.5 MG capsule TAKE ONE CAPSULE BY MOUTH DAILY 90 capsule 2   LORazepam (ATIVAN) 1 MG tablet TAKE 1 TABLET BY MOUTH TWICE A DAY AS NEEDED 60 tablet 5   omeprazole (PRILOSEC OTC) 20 MG tablet Take 20 mg by mouth daily.     pregabalin (LYRICA) 200 MG capsule TAKE 1 CAPSULE TWICE DAILY 180 capsule 1   rosuvastatin (CRESTOR) 20 MG tablet TAKE ONE TABLET BY MOUTH DAILY 90 tablet 2   sildenafil (VIAGRA) 100 MG tablet Take 0.5-1 tablets (50-100 mg total) by mouth daily as needed for erectile dysfunction. 5 tablet 11   tapentadol (NUCYNTA) 50 MG tablet Take 1 tablet (50 mg total) by mouth 3 (three) times daily. 90 tablet 0   tirzepatide (MOUNJARO) 7.5 MG/0.5ML Pen Inject 7.5 mg into the skin once a week. 2 mL 0   TRESIBA FLEXTOUCH 200 UNIT/ML FlexTouch Pen INJECT 74 UNITS UNDER THE SKIN EVERY DAY 36 mL 3   Current Facility-Administered Medications on File Prior to Visit  Medication Dose Route Frequency Provider Last Rate Last Admin   dextrose 5 % solution   Intravenous Once Danis, Starr Lake III, MD        dextrose 50 % solution 25 mL  25 mL Intravenous Once Sherrilyn Rist, MD            ROS:  All others reviewed and negative.  Objective        PE:  BP 118/66   Pulse (!) 104   Temp 98.3 F (36.8 C) (Temporal)   Ht 5\' 9"  (1.753 m)   Wt 225 lb 8 oz (102.3 kg)   SpO2 94%   BMI 33.30 kg/m  Constitutional: Pt appears in NAD               HENT: Head: NCAT.                Right Ear: External ear normal.                 Left Ear: External ear normal.                Eyes: . Pupils are equal, round, and reactive to light. Conjunctivae and EOM are normal               Nose: without d/c or deformity               Neck: Neck supple. Gross normal ROM               Cardiovascular: Normal rate and regular rhythm.                 Pulmonary/Chest: Effort normal and breath sounds without rales or wheezing.                Abd:  Soft, NT, ND, + BS, no organomegaly               Neurological: Pt is alert. At baseline orientation, motor grossly intact               Skin: Skin is warm. No rashes, no other new lesions, LE edema - none               Psychiatric: Pt behavior is normal without agitation   Micro: none  Cardiac tracings I have personally interpreted today:  none  Pertinent Radiological findings (summarize): none   Lab Results  Component Value Date   WBC 7.8 09/07/2023   HGB 15.9 09/07/2023   HCT 47.6 09/07/2023   PLT 193.0 09/07/2023   GLUCOSE 78 09/07/2023   CHOL 121 09/07/2023   TRIG 162.0 (H) 09/07/2023   HDL 44.60 09/07/2023   LDLDIRECT 130.0 03/03/2022   LDLCALC 44 09/07/2023   ALT 17 09/07/2023   AST 21 09/07/2023   NA 140 09/07/2023   K 4.2 09/07/2023   CL 101 09/07/2023   CREATININE 1.14 09/07/2023   BUN 14 09/07/2023   CO2 30 09/07/2023   TSH 1.81 09/07/2023   PSA 0.43 09/07/2023   HGBA1C 8.4 (A) 09/03/2023   MICROALBUR 1.7 09/07/2023   Assessment/Plan:  Frank [name] is a 68 y.o. White or Caucasian [1] male with  has a past medical  history of Abdominal pain, left lower quadrant (06/06/2010), Abdominal pain, unspecified site (01/19/2009), ADD (10/09/2008), ALLERGIC RHINITIS (10/09/2008), Allergy, ANXIETY DEPRESSION (02/01/2008), Arthritis, Blood transfusion without reported diagnosis, Cataract, COLONIC POLYPS (02/01/2008), DIABETES MELLITUS, TYPE II (05/20/2010), DYSPNEA (03/12/2010), ERECTILE DYSFUNCTION (10/09/2008), ERECTILE DYSFUNCTION, ORGANIC (05/20/2010), FOOT PAIN, LEFT (05/20/2010), GERD (02/01/2008), Guillain Barr syndrome (HCC), HEMORRHOIDS (02/01/2008), HIATAL HERNIA (02/01/2008), HYPERLIPIDEMIA (10/09/2008), HYPERTENSION (10/09/2008), MORTON'S NEUROMA (05/20/2010), Other specified forms of hearing loss (06/27/2009), PERIPHERAL EDEMA (05/20/2010), PERIPHERAL NEUROPATHY (05/20/2010), Sleep apnea, SLEEP APNEA, OBSTRUCTIVE (02/01/2008), Stricture and stenosis of esophagus (02/02/2008), Type II or unspecified type diabetes mellitus without mention of complication, uncontrolled (11/14/2010), and WRIST PAIN, LEFT (12/05/2009).  Encounter for well adult exam with abnormal findings Age and sex appropriate education and counseling updated with regular exercise and diet Referrals for preventative services - none needed Immunizations addressed - declines all immunizations Smoking counseling  - none needed Evidence for depression or other mood disorder - none significant  Most recent labs reviewed. I have personally reviewed and have noted: 1) the patient's medical and social history 2) The patient's current medications and supplements 3) The patient's height, weight, and BMI have been recorded in the chart   Gait disorder With recent worsening, for PT referral  B12 deficiency Lab Results  Component Value Date   VITAMINB12 293 09/07/2023   Low, to start oral replacement - b12 1000 mcg qd   Diabetes mellitus due to underlying condition with diabetic neuropathy (HCC) Lab Results  Component Value Date   HGBA1C 8.4 (A) 09/03/2023    uncontrolled, pt to continue current medical treatment farxiga 10 every day, glucotrol xl 10 every day, tresiba 74 u every day, and increased mounjaro 75 g weekly, f/u endo as planned   Essential hypertension BP Readings from Last 3 Encounters:  09/08/23 137/85  09/07/23 118/66  09/03/23 118/60   Stable, pt to continue medical treatment hct 12.5 qd   Hyperlipidemia Lab Results  Component Value Date   LDLCALC 44 09/07/2023   Stable, pt to continue current statin crestor 20 qd   Vitamin D deficiency Last vitamin D Lab Results  Component Value Date   VD25OH 66.71 09/07/2023   Stable, cont oral replacement   Generalized weakness Also for lab as ordered  Constipation Mild to mod, for miralax every day prn,  to f/u any worsening symptoms or concerns  Followup: Return in about 6 months (around 03/06/2024).  Oliver Barre, MD 09/10/2023 9:32 PM [facility]

## 2023-09-08 ENCOUNTER — Ambulatory Visit: Payer: Self-pay | Admitting: Neurology

## 2023-09-08 ENCOUNTER — Ambulatory Visit (INDEPENDENT_AMBULATORY_CARE_PROVIDER_SITE_OTHER): Payer: Medicare PPO | Admitting: Neurology

## 2023-09-08 VITALS — BP 137/85 | HR 105 | Ht 69.0 in

## 2023-09-08 DIAGNOSIS — Z8669 Personal history of other diseases of the nervous system and sense organs: Secondary | ICD-10-CM

## 2023-09-08 DIAGNOSIS — G5622 Lesion of ulnar nerve, left upper limb: Secondary | ICD-10-CM | POA: Diagnosis not present

## 2023-09-08 DIAGNOSIS — G629 Polyneuropathy, unspecified: Secondary | ICD-10-CM

## 2023-09-08 DIAGNOSIS — Z0289 Encounter for other administrative examinations: Secondary | ICD-10-CM

## 2023-09-08 NOTE — Progress Notes (Signed)
Full Name: Frank [name] Gender: Male MRN #: [id number] Date of Birth: 1956-04-04    Visit Date: 09/08/2023 14:17 Age: 68 Years Examining Physician: Naomie Dean, MD  Referring Physician: Dr. Despina Arias Height: 5 feet 9 inch    History: Patient with significant history of severe GBS and also diabetic polyneuropathy. Here for evaluation of numbness up the knees, left leg is worse. He also has new pain in digit 5 on the left. And middle finger right feels cold, more numb.   Summary: NCS were performed on the Bilateral upper extremities and right lower extremity.  The left median motor nerve showed delayed distal onset latency (5.9 ms, normal less than 4.4) and reduced amplitude (2.8 mV, normal greater than 4). The right  median motor nerve showed delayed distal onset latency (5.7 ms, normal less than 4.4) and reduced amplitude (0.9 mV, normal greater than 4) and decreased amplitude (37 m/s, normal greater than 45).  The left ulnar ADM motor nerve showed delayed distal onset latency (4 ms, normal less than 3.3), reduced motor unit amplitude (0.9 mV, normal greater than 6) and a 21 m/s drop across the left elbow. The right ulnar ADM motor nerve showed delayed distal onset latency (3.8 ms, normal less than 3.3), reduced motor unit amplitude (1.2 mV, normal greater than 6). The left ulnar FDI motor nerve showed reduced motor unit amplitude (3.7 mV, normal greater than 7) and am 11 m/s drop across the left elbow. The right ulnar FDI motor nerve showed delayed reduced motor unit amplitude (4.3 mV, normal greater than 7).  The right tibial and and right peroneal motor nerve showed no response.  The left radial sensory nerve showed delayed distal peak latency (3 ms, normal less than 2.9).  Otherwise the sensory conductions were globally absent including the left median, left ulnar, left sural, right sural, left superficial peroneal, left median and left ulnar sensory nerves.  EMG nerve conduction studies  were performed on the left upper and left lower extremities.  The following muscles showed chronic neurogenic changes including prolonged motor unit duration, increased motor unit amplitude, polyphasic motor units and diminished motor unit recruitment: Left biceps, left triceps, left flexor digitorum profundus, left pronator teres, left extensor indices proprius, left opponens pollicis, left first dorsal interosseous, left vastus medialis, left tibialis anterior, left gastrocnemius, left extensor hallucis longus with decreased insertional of the left abductor hallucis (could not activate) and normal left deltoid.  There was also decreased insertional activity at the left flexor digitorum profundus ulnar head and spontaneous activity in the FDI, a distal ulnar muscle.  Conclusion:  Nerve studies recording from both the FDI and ADM on the left showed reduced amplitude, delayed distal onset latency, conduction velocity drop across the elbow.  In addition EMG showed acute/ongoing denervation and chronic neurogenic changes in the left FDI with decreased insertional in the left FDP ulnar head consistent with severe ulnar neuropathy across the left elbow (not seen on the right) There is concomitant electrophysiologic evidence of moderately severe, sensorimotor, length dependent, axonal, widespread polyneuropathy with widespread chronic neurogenic changes possibly due to his past history including severe Guillain-Barr syndrome and diabetic polyneuropathy.  No distal acute/ongoing denervation seen was thought to be related to his polyneuropathy. There is no conduction block across the carpal tunnel when recording a distal median motor muscle in the palm and then more proximally across the carpal tunnel suggesting that there is no median neuropathy across the wrists.      -------------------------------  Naomie Dean, M.D.  [facility] [address] [place], [place] [address] Tel:  [phone] Fax: [phone]  Verbal informed consent was obtained from the patient, patient was informed of potential risk of procedure, including bruising, bleeding, hematoma formation, infection, muscle weakness, muscle pain, numbness, among others.        MNC    Nerve / Sites Muscle Latency Ref. Amplitude Ref. Rel Amp Segments Distance Velocity Ref. Area    ms ms mV mV %  cm m/s m/s mVms  L Median - APB     Wrist APB 5.9 <=4.4 2.8 >=4.0 100 Wrist - APB 7   6.3     Upper arm APB 10.6  2.0  73.5 Upper arm - Wrist 23 49 >=49 8.6  Median palm APB 2.7  1.0  50.1 Ulnar Wrist - APB    2.8  R Median - APB     Wrist APB 5.7 <=4.4 0.9 >=4.0 100 Wrist - APB 7   1.7     Upper arm APB 12.1  0.2  26.9 Upper arm - Wrist 23.6 37 >=49 0.6   Median palm APB 4.2  0.3  105 Ulnar Wrist - APB    0.3  L Ulnar - ADM     Wrist ADM 4.0 <=3.3 0.9 >=6.0 100 Wrist - ADM 7   1.8     B.Elbow ADM 6.2  0.3  31.4 B.Elbow - Wrist 11 50 >=49 0.5     A.Elbow ADM 12.1  0.5  173 A.Elbow - B.Elbow 17 29 >=49 0.9  R Ulnar - ADM     Wrist ADM 3.8 <=3.3 1.2 >=6.0 100 Wrist - ADM 7   4.1     B.Elbow ADM 5.8  1.1  87.5 B.Elbow - Wrist 10 49 >=49 3.4     A.Elbow ADM 10.3  2.5  239 A.Elbow - B.Elbow 22 49 >=49 10.0  L Peroneal - EDB     Ankle EDB NR <=6.5 NR >=2.0 NR Ankle - EDB 9   NR         Pop fossa - Ankle      L Tibial - AH     Ankle AH NR <=5.8 NR >=4.0 NR Ankle - AH 9   NR  L Ulnar - FDI     Wrist FDI 4.0 <=4.5 3.2 >=7.0 100 Wrist - FDI 8   7.4     B.Elbow FDI 6.4  2.6  82.5 B.Elbow - Wrist 10 41 >=49 5.9     A.Elbow FDI 12.8  2.3  89.1 A.Elbow - B.Elbow 19 30 >=49          A.Elbow - Wrist      R Ulnar - FDI     Wrist FDI 4.2 <=4.5 4.3 >=7.0 100 Wrist - FDI 8   9.0     B.Elbow FDI 6.8  3.4  79.8 B.Elbow - Wrist 10 39 >=49 8.2     A.Elbow FDI 11.2  2.9  83.7 A.Elbow - B.Elbow 21 47 >=49 7.2         A.Elbow - Wrist                         SNC    Nerve / Sites Rec. Site Peak Lat Ref.  Amp Ref. Segments  Distance    ms ms V V  cm  L Median - Digit II (Antidromic)     Wrist Dig II NR <=  3.5 NR >=20 Wrist - Dig II 13  L Ulnar - Digit V (Antidromic)     Wrist Dig V NR <=3.1 NR >=17 Wrist - Dig V 11  L Radial - Anatomical snuff box (Forearm)     Forearm Wrist 3.0 <=2.9 18 >=15 Forearm - Wrist 10  R Sural - Ankle (Calf)     Calf Ankle NR <=4.4 NR >=6 Calf - Ankle 14  L Sural - Ankle (Calf)     Calf Ankle NR <=4.4 NR >=6 Calf - Ankle 14  L Superficial peroneal - Ankle     Lat leg Ankle NR <=4.4 NR >=6 Lat leg - Ankle 14  L Median - Orthodromic (Dig II, Mid palm)     Dig II Wrist NR <=3.4 NR >=10 Dig II - Wrist 13  L Ulnar - Orthodromic, (Dig V, Mid palm)     Dig V Wrist NR <=3.1 NR >=5 Dig V - Wrist 17                     F  Wave    Nerve F Lat Ref.   ms ms  L Median - APB 33.4 <=31.0  L Ulnar - ADM 31.5 <=32.0         EMG Summary Table    Spontaneous MUAP Recruitment  Muscle IA Fib PSW Fasc Other Amp Dur. Poly Pattern  L. Biceps brachii Normal None None None _______ Increased Increased 4+ Reduced  L. Deltoid Normal None None None _______ Normal Normal Normal Normal  L. Triceps brachii Normal None None None _______ Increased Increased 2+ Reduced  L. Flexor digitorum profundus (Ulnar) Decreased None None None _______ Increased Increased 4+ Reduced  L. Pronator teres Normal None None None _______ Increased Increased 4+ Reduced  L. Extensor indicis proprius Normal None None None _______ Increased Increased 4+ Reduced  L. Opponens pollicis Normal None None None _______ Increased Increased 3+ Reduced  L. First dorsal interosseous Normal None +2 None _______ Increased Increased 4+ Reduced  L. Cervical paraspinals (low) Normal None None None _______ Normal Normal Normal Normal  L. Vastus medialis Normal None None None _______ Increased Increased 4+ Reduced  L. Tibialis anterior Normal None None None _______ Increased Increased 3+ Reduced  L. Gastrocnemius (Medial head) Normal None None  None _______ Increased Increased 2+ Reduced  L. Extensor hallucis longus Normal None None None _______ Increased Normal 3+ Reduced  L. Abductor hallucis Decreased None None None _______ Could not activate

## 2023-09-09 NOTE — Progress Notes (Signed)
See procedure note.

## 2023-09-10 ENCOUNTER — Encounter: Payer: Self-pay | Admitting: Internal Medicine

## 2023-09-10 DIAGNOSIS — K5900 Constipation, unspecified: Secondary | ICD-10-CM | POA: Insufficient documentation

## 2023-09-10 NOTE — Assessment & Plan Note (Signed)
Mild to mod, for miralax every day prn,  to f/u any worsening symptoms or concerns

## 2023-09-10 NOTE — Assessment & Plan Note (Signed)
Lab Results  Component Value Date   VITAMINB12 293 09/07/2023   Low, to start oral replacement - b12 1000 mcg qd

## 2023-09-10 NOTE — Assessment & Plan Note (Signed)
Also for lab as ordered

## 2023-09-10 NOTE — Assessment & Plan Note (Signed)
Age and sex appropriate education and counseling updated with regular exercise and diet Referrals for preventative services - none needed Immunizations addressed - declines all immunizations Smoking counseling  - none needed Evidence for depression or other mood disorder - none significant Most recent labs reviewed. I have personally reviewed and have noted: 1) the patient's medical and social history 2) The patient's current medications and supplements 3) The patient's height, weight, and BMI have been recorded in the chart

## 2023-09-10 NOTE — Assessment & Plan Note (Signed)
Lab Results  Component Value Date   HGBA1C 8.4 (A) 09/03/2023   uncontrolled, pt to continue current medical treatment farxiga 10 every day, glucotrol xl 10 every day, tresiba 74 u every day, and increased mounjaro 75 g weekly, f/u endo as planned

## 2023-09-10 NOTE — Assessment & Plan Note (Signed)
BP Readings from Last 3 Encounters:  09/08/23 137/85  09/07/23 118/66  09/03/23 118/60   Stable, pt to continue medical treatment hct 12.5 qd

## 2023-09-10 NOTE — Assessment & Plan Note (Signed)
Lab Results  Component Value Date   LDLCALC 44 09/07/2023   Stable, pt to continue current statin crestor 20 qd

## 2023-09-10 NOTE — Assessment & Plan Note (Signed)
With recent worsening, for PT referral

## 2023-09-10 NOTE — Assessment & Plan Note (Signed)
Last vitamin D Lab Results  Component Value Date   VD25OH 66.71 09/07/2023   Stable, cont oral replacement

## 2023-09-13 DIAGNOSIS — G5622 Lesion of ulnar nerve, left upper limb: Secondary | ICD-10-CM | POA: Insufficient documentation

## 2023-09-18 DIAGNOSIS — E1140 Type 2 diabetes mellitus with diabetic neuropathy, unspecified: Secondary | ICD-10-CM | POA: Diagnosis not present

## 2023-09-22 ENCOUNTER — Other Ambulatory Visit: Payer: Self-pay | Admitting: "Endocrinology

## 2023-09-22 DIAGNOSIS — E1165 Type 2 diabetes mellitus with hyperglycemia: Secondary | ICD-10-CM

## 2023-09-24 ENCOUNTER — Other Ambulatory Visit: Payer: Self-pay | Admitting: Internal Medicine

## 2023-09-24 ENCOUNTER — Other Ambulatory Visit: Payer: Self-pay | Admitting: Neurology

## 2023-09-24 DIAGNOSIS — E1142 Type 2 diabetes mellitus with diabetic polyneuropathy: Secondary | ICD-10-CM

## 2023-09-24 MED ORDER — TAPENTADOL HCL 50 MG PO TABS: 50.0000 mg | ORAL_TABLET | Freq: Three times a day (TID) | ORAL | 0 refills | Status: DC

## 2023-09-24 NOTE — Telephone Encounter (Signed)
You last saw him 06/10/23 and next f/u 01/07/2024. Last refilled 08/27/23 #90.

## 2023-09-24 NOTE — Telephone Encounter (Signed)
Pt is requesting a refill for  tapentadol (NUCYNTA) 50 MG tablet .  Pharmacy: [facility] Store ID: [phone]

## 2023-09-29 ENCOUNTER — Telehealth: Payer: Self-pay | Admitting: Internal Medicine

## 2023-09-29 NOTE — Telephone Encounter (Signed)
Copied from CRM [id number]. Topic: Clinical - Prescription Issue >> Sep 28, 2023  4:17 PM [place] E wrote: Reason for CRM: Patient's spouse called in stating that when he went to go pick up patient's methylphenidate (CONCERTA) 36 MG PO CR tablet, the pharmacy stated that they were completely out of this medication. Patient took his last dose on Thursday 2/13, and spouse is wondering if this is a warehouse issue regarding [facility], and if Dr. Oliver Barre could call it into another pharmacy. Callback number for spouse is [phone] for clarification on this issue.

## 2023-09-30 MED ORDER — METHYLPHENIDATE HCL ER (OSM) 36 MG PO TBCR: 36.0000 mg | EXTENDED_RELEASE_TABLET | Freq: Every day | ORAL | 0 refills | Status: AC

## 2023-09-30 NOTE — Telephone Encounter (Signed)
Called and spoke with Pt spouse he states it was a mistake.

## 2023-09-30 NOTE — Telephone Encounter (Signed)
Not sure what the last statement meant, but I did send to [facility] instead of [facility]

## 2023-10-01 ENCOUNTER — Telehealth: Payer: Self-pay | Admitting: *Deleted

## 2023-10-01 DIAGNOSIS — G5622 Lesion of ulnar nerve, left upper limb: Secondary | ICD-10-CM

## 2023-10-01 NOTE — Telephone Encounter (Signed)
-----   Message from Frank [name] sent at 10/01/2023  8:31 AM EST ----- Regarding: EMG/NCV results Please let him know that the study showed 2 main findings: 1.  It showed the old polyneuropathy.  We will continue our medications to help with some of the symptoms 2.  It also showed that the ulnar nerve at the left elbow was being compressed causing some of the symptoms in the left hand.  The needle part of the exam showed that this was an active ongoing process.  Therefore, I would like to refer him to a surgeon for "left ulnar neuropathy at the elbow".  Dr. Katrinka Blazing of [facility] neurosurgery specializes in surgery involving the nerves.  We can send in a referral for this.  His practices in [place].  If he would rather stay in [place] we could refer to [facility] orthopedics or [facility] to one of their hand surgeons

## 2023-10-01 NOTE — Telephone Encounter (Signed)
LVM for pt to call about results. °

## 2023-10-06 ENCOUNTER — Other Ambulatory Visit: Payer: Self-pay

## 2023-10-06 ENCOUNTER — Telehealth: Payer: Self-pay

## 2023-10-06 ENCOUNTER — Other Ambulatory Visit: Payer: Self-pay | Admitting: Internal Medicine

## 2023-10-06 NOTE — Telephone Encounter (Signed)
 Patient states that his sugar was 60 at 4pm yesterday. He drank orange juice and it went up to 73. This morning fasting sugar is 78.  Patient states that he has been averaging around 100 per his Dexcom but sugar keeps dropping through day. He has not checked by finger stick just going by Dexcom readings.    I am unable to download as patient has receiver.    Tresiba  76 units Glipizide once a day [place] once a day  Mounjaro 7.5 mg weekly.

## 2023-10-06 NOTE — Telephone Encounter (Signed)
 Patient partner [name]  is aware and they will make medication changes.

## 2023-10-07 ENCOUNTER — Telehealth: Payer: Self-pay | Admitting: Internal Medicine

## 2023-10-07 NOTE — Telephone Encounter (Signed)
 Sorry, please clarify "[place] perio"

## 2023-10-07 NOTE — Addendum Note (Signed)
 Addended by: Arther Abbott on: 10/07/2023 01:23 PM   Modules accepted: Orders

## 2023-10-07 NOTE — Telephone Encounter (Signed)
 Copied from CRM [id number]. Topic: General - Other >> Oct 07, 2023  8:11 AM Truddie Crumble wrote: Reason for CRM:Amy from [facility] called stating doctor Benitez want to set up a time to talk to doctor Oliver Barre regarding the patient CB [phone]

## 2023-10-07 NOTE — Telephone Encounter (Signed)
 Not sure what to say since I dont really have available time to schedule this during the day when I am seeing patients and scheduling is full.  Is there a specific question I can answer?

## 2023-10-07 NOTE — Telephone Encounter (Signed)
 Called and spoke w/ husband/Mike. Relayed results per Dr. Bonnita Hollow note. He verbalized understanding.   I placed referral to Dr. Katrinka Blazing. Aware their office should call in next couple weeks to schedule appt. If not, asked they let us know.

## 2023-10-09 NOTE — Telephone Encounter (Signed)
 No answer cell phone 445 pm.   Office closed on fridays

## 2023-10-09 NOTE — Telephone Encounter (Signed)
 No answer cell phone  1 pm

## 2023-10-12 DIAGNOSIS — M6281 Muscle weakness (generalized): Secondary | ICD-10-CM | POA: Diagnosis not present

## 2023-10-12 DIAGNOSIS — R269 Unspecified abnormalities of gait and mobility: Secondary | ICD-10-CM | POA: Diagnosis not present

## 2023-10-12 NOTE — Telephone Encounter (Signed)
 Spoke to dr Russella Dar regarding dental procedure with sedation and meds to be held including mounjaro, glipizide, nucynta, lorazepam, and flexeril

## 2023-10-22 ENCOUNTER — Ambulatory Visit: Payer: Medicare PPO | Admitting: "Endocrinology

## 2023-10-22 ENCOUNTER — Encounter: Payer: Self-pay | Admitting: "Endocrinology

## 2023-10-22 VITALS — BP 110/80 | HR 64 | Ht 69.0 in | Wt 216.0 lb

## 2023-10-22 DIAGNOSIS — Z794 Long term (current) use of insulin: Secondary | ICD-10-CM | POA: Diagnosis not present

## 2023-10-22 DIAGNOSIS — Z7984 Long term (current) use of oral hypoglycemic drugs: Secondary | ICD-10-CM

## 2023-10-22 DIAGNOSIS — Z7985 Long-term (current) use of injectable non-insulin antidiabetic drugs: Secondary | ICD-10-CM | POA: Diagnosis not present

## 2023-10-22 DIAGNOSIS — E782 Mixed hyperlipidemia: Secondary | ICD-10-CM | POA: Diagnosis not present

## 2023-10-22 DIAGNOSIS — E11649 Type 2 diabetes mellitus with hypoglycemia without coma: Secondary | ICD-10-CM | POA: Diagnosis not present

## 2023-10-22 MED ORDER — TIRZEPATIDE 10 MG/0.5ML ~~LOC~~ SOAJ: 10.0000 mg | SUBCUTANEOUS | 0 refills | Status: DC

## 2023-10-22 NOTE — Progress Notes (Signed)
 Outpatient Endocrinology Note Frank Matheny, MD  10/22/23   Frank [name] 70-Aug-1957 [id number]  Referring Provider: Corwin Levins, MD Primary Care Provider: Corwin Levins, MD Reason for consultation: Subjective   Assessment & Plan  Diagnoses and all orders for this visit:  Uncontrolled type 2 diabetes mellitus with hypoglycemia without coma (HCC)  Long term (current) use of oral hypoglycemic drugs  Long-term (current) use of injectable non-insulin antidiabetic drugs  Long-term insulin use (HCC)  Mixed hypercholesterolemia and hypertriglyceridemia  Other orders -     tirzepatide (MOUNJARO) 10 MG/0.5ML Pen; Inject 10 mg into the skin once a week.   Diabetes Type 2 complicated by neuropathy Lab Results  Component Value Date   GFR 65.36 09/07/2023   Hba1c goal less than 7, current Hba1c is  Lab Results  Component Value Date   HGBA1C 8.4 (A) 09/03/2023   Will recommend the following: Farxiga 10 mg daily Glipizide XL 10 mg once every morning Mounjaro 7.5 mg weekly (start with 10 mg after finishes current dose) Tresiba 56 units daily (decrease by 2 units if BG <80, and increase by 2 units if BG>150 consistently) Not interested in mealtime insulin  Seen vascular 07/2023: normal pulses/ABI Discussed about lifestyle changes   Side effects from medications: Diarrhea from metformin ER Stopped Ozempic 2 mg weekly to help with weight loss  No known contraindications to any of above medications  -Last LD and Tg are as follows: Lab Results  Component Value Date   LDLCALC 44 09/07/2023    Lab Results  Component Value Date   TRIG 162.0 (H) 09/07/2023   -Recommend rosuvastatin 20 mg QD -Follow low fat diet and exercise   -Blood pressure goal <140/90 - Microalbumin/creatinine goal < 30 -Last MA/Cr is as follows: Lab Results  Component Value Date   MICROALBUR 1.7 09/07/2023   -not on ACE/ARB  -diet changes including salt restriction -limit eating  outside -counseled BP targets per standards of diabetes care -uncontrolled blood pressure can lead to retinopathy, nephropathy and cardiovascular and atherosclerotic heart disease  Reviewed and counseled on: -A1C target -Blood sugar targets -Complications of uncontrolled diabetes  -Checking blood sugar before meals and bedtime and bring log next visit -All medications with mechanism of action and side effects -Hypoglycemia management: rule of 15's, Glucagon Emergency Kit and medical alert ID -low-carb low-fat plate-method diet -At least 20 minutes of physical activity per day -Annual dilated retinal eye exam and foot exam -compliance and follow up needs -follow up as scheduled or earlier if problem gets worse  Call if blood sugar is less than 70 or consistently above 250    Take a 15 gm snack of carbohydrate at bedtime before you go to sleep if your blood sugar is less than 100.    If you are going to fast after midnight for a test or procedure, ask your physician for instructions on how to reduce/decrease your insulin dose.    Call if blood sugar is less than 70 or consistently above 250  -Treating a low sugar by rule of 15  (15 gms of sugar every 15 min until sugar is more than 70) If you feel your sugar is low, test your sugar to be sure If your sugar is low (less than 70), then take 15 grams of a fast acting Carbohydrate (3-4 glucose tablets or glucose gel or 4 ounces of juice or regular soda) Recheck your sugar 15 min after treating low to make sure it is more  than 70 If sugar is still less than 70, treat again with 15 grams of carbohydrate          Don't drive the hour of hypoglycemia  If unconscious/unable to eat or drink by mouth, use glucagon injection or nasal spray baqsimi and call 911. Can repeat again in 15 min if still unconscious.  Return in about 3 months (around 01/22/2024).   I have reviewed current medications, nurse's notes, allergies, vital signs, past medical  and surgical history, family medical history, and social history for this encounter. Counseled patient on symptoms, examination findings, lab findings, imaging results, treatment decisions and monitoring and prognosis. The patient understood the recommendations and agrees with the treatment plan. All questions regarding treatment plan were fully answered.  Frank [name], MD  10/22/23    History of Present Illness Frank [name] is a 68 y.o. year old male who presents for evaluation of Type 2 diabetes mellitus.  Frank [name] was first diagnosed in 2012.   Diabetes education +  Home diabetes regimen: Non-insulin hypoglycemic drugs: Farxiga 10 mg daily, mounjaro 7.5mg  weekly, glipizide XL 10 mg daily    Insulin regimen: Tresiba 60 units daily          Previously on Ozempic 2 mg weekly Side effects from medications: Diarrhea from metformin ER  Previous history:  His A1c has ranged between 7 and 10.8 since 2018 Farxiga was started in 2022, was on Ozempic since 7/22, previously he took Trulicity for a year  COMPLICATIONS -  MI/Stroke -  retinopathy +  neuropathy -  nephropathy  BLOOD SUGAR DATA  CGM interpretation: At today's visit, we reviewed her CGM downloads. The full report is scanned in the media. Reviewing the CGM trends, BG are well controlled throughout the day with some lows.    Physical Exam  BP 110/80   Pulse 64   Ht 5\' 9"  (1.753 m)   Wt 216 lb (98 kg)   SpO2 98%   BMI 31.90 kg/m    Constitutional: well developed, well nourished Head: normocephalic, atraumatic Eyes: sclera anicteric, no redness Neck: supple Lungs: normal respiratory effort Neurology: alert and oriented Skin: dry, no appreciable rashes Musculoskeletal: no appreciable defects Psychiatric: normal mood and affect Diabetic Foot Exam - Simple   No data filed      Current Medications Patient's Medications  New Prescriptions   TIRZEPATIDE (MOUNJARO) 10 MG/0.5ML PEN    Inject  10 mg into the skin once a week.  Previous Medications   ACCU-CHEK SOFTCLIX LANCETS LANCETS    Use to check blood sugars daily   ARIPIPRAZOLE (ABILIFY) 10 MG TABLET    TAKE 1 TABLET EVERY DAY   CONTINUOUS BLOOD GLUC SENSOR (DEXCOM G7 SENSOR) MISC    Change every 10 days   CYCLOBENZAPRINE (FLEXERIL) 5 MG TABLET    TAKE 1 TABLET AT BEDTIME   DAPAGLIFLOZIN PROPANEDIOL (FARXIGA) 10 MG TABS TABLET    Take 1 tablet (10 mg total) by mouth daily before breakfast.   DROPLET PEN NEEDLES 31G X 8 MM MISC    USE TO INJECT TRESIBA UNDER THE SKIN EVERY DAY AS DIRECTED   DULOXETINE (CYMBALTA) 60 MG CAPSULE    TAKE 1 CAPSULE TWICE DAILY   GLIPIZIDE (GLUCOTROL XL) 10 MG 24 HR TABLET    Take 1 tablet (10 mg total) by mouth daily with breakfast.   GLUCOSE BLOOD (ACCU-CHEK GUIDE) TEST STRIP    Use to check blood sugars daily   HYDROCHLOROTHIAZIDE (MICROZIDE) 12.5 MG CAPSULE  TAKE 1 CAPSULE BY MOUTH EVERY DAY   LORAZEPAM (ATIVAN) 1 MG TABLET    TAKE 1 TABLET BY MOUTH TWICE A DAY AS NEEDED   METHYLPHENIDATE (CONCERTA) 36 MG PO CR TABLET    Take 1 tablet (36 mg total) by mouth daily.   OMEPRAZOLE (PRILOSEC OTC) 20 MG TABLET    Take 20 mg by mouth daily.   PREGABALIN (LYRICA) 200 MG CAPSULE    TAKE 1 CAPSULE TWICE DAILY   ROSUVASTATIN (CRESTOR) 20 MG TABLET    TAKE 1 TABLET BY MOUTH EVERY DAY   SILDENAFIL (VIAGRA) 100 MG TABLET    Take 0.5-1 tablets (50-100 mg total) by mouth daily as needed for erectile dysfunction.   TAPENTADOL (NUCYNTA) 50 MG TABLET    Take 1 tablet (50 mg total) by mouth 3 (three) times daily.   TRESIBA FLEXTOUCH 200 UNIT/ML FLEXTOUCH PEN    INJECT 74 UNITS UNDER THE SKIN EVERY DAY  Modified Medications   No medications on file  Discontinued Medications   TIRZEPATIDE (MOUNJARO) 7.5 MG/0.5ML PEN    INJECT 7.5MG  (1 PEN) UNDER THE SKIN EVERY WEEK    Allergies Allergies  Allergen Reactions   Codeine Itching    REACTION: Itching   Influenza Vaccines     Due to hx of GBS   Metformin And  Related Other (See Comments)    GI upset, diarrhea   Other Other (See Comments)    Due to hx of GBS- vaccines    Shellfish Allergy     Past Medical History Past Medical History:  Diagnosis Date   Abdominal pain, left lower quadrant 06/06/2010   Abdominal pain, unspecified site 01/19/2009   ADD 10/09/2008   ALLERGIC RHINITIS 10/09/2008   Allergy    ANXIETY DEPRESSION 02/01/2008   Arthritis    hands ?   Blood transfusion without reported diagnosis    Cataract    removed both eyes    COLONIC POLYPS 02/01/2008   DIABETES MELLITUS, TYPE II 05/20/2010   DYSPNEA 03/12/2010   ERECTILE DYSFUNCTION 10/09/2008   ERECTILE DYSFUNCTION, ORGANIC 05/20/2010   FOOT PAIN, LEFT 05/20/2010   GERD 02/01/2008   Guillain Barr syndrome (HCC)    HEMORRHOIDS 02/01/2008   HIATAL HERNIA 02/01/2008   HYPERLIPIDEMIA 10/09/2008   HYPERTENSION 10/09/2008   MORTON'S NEUROMA 05/20/2010   Other specified forms of hearing loss 06/27/2009   PERIPHERAL EDEMA 05/20/2010   PERIPHERAL NEUROPATHY 05/20/2010   Sleep apnea    stopped cpap    SLEEP APNEA, OBSTRUCTIVE 02/01/2008   Stricture and stenosis of esophagus 02/02/2008   Type II or unspecified type diabetes mellitus without mention of complication, uncontrolled 11/14/2010   WRIST PAIN, LEFT 12/05/2009    Past Surgical History Past Surgical History:  Procedure Laterality Date   CARPAL TUNNEL RELEASE Bilateral    COLONOSCOPY     DENTAL SURGERY     ESOPHAGEAL DILATION  july 2009   ESOPHAGOGASTRODUODENOSCOPY N/A 06/27/2014   Procedure: ESOPHAGOGASTRODUODENOSCOPY (EGD);  Surgeon: Hart Carwin, MD;  Location: [facility];  Service: Endoscopy;  Laterality: N/A;   EYE SURGERY     catract surgery on both eyes   POLYPECTOMY     ROTATOR CUFF REPAIR     UPPER GASTROINTESTINAL ENDOSCOPY      Family History family history includes Depression in his mother; Diabetes in his brother and mother; Heart disease in his mother; Hyperlipidemia in his mother.  Social History Social  History   Socioeconomic History   Marital status: Single  Spouse name: Not on file   Number of children: Not on file   Years of education: Not on file   Highest education level: Not on file  Occupational History   Occupation: Housekeeper [employer]    Employer: [employer]  Tobacco Use   Smoking status: Never   Smokeless tobacco: Never  Vaping Use   Vaping status: Never Used  Substance and Sexual Activity   Alcohol use: Yes    Comment: occasional   Drug use: Yes    Types: Methaqualone   Sexual activity: Yes    Comment: before I got sick  Other Topics Concern   Not on file  Social History Narrative   Not on file   Social Drivers of Health   Financial Resource Strain: Not on file  Food Insecurity: Not on file  Transportation Needs: Not on file  Physical Activity: Not on file  Stress: Not on file  Social Connections: Not on file  Intimate Partner Violence: Not on file    Lab Results  Component Value Date   HGBA1C 8.4 (A) 09/03/2023   HGBA1C 8.2 (A) 06/03/2023   HGBA1C 6.5 (A) 01/28/2023   Lab Results  Component Value Date   CHOL 121 09/07/2023   Lab Results  Component Value Date   HDL 44.60 09/07/2023   Lab Results  Component Value Date   LDLCALC 44 09/07/2023   Lab Results  Component Value Date   TRIG 162.0 (H) 09/07/2023   Lab Results  Component Value Date   CHOLHDL 3 09/07/2023   Lab Results  Component Value Date   CREATININE 1.14 09/07/2023   Lab Results  Component Value Date   GFR 65.36 09/07/2023   Lab Results  Component Value Date   MICROALBUR 1.7 09/07/2023      Component Value Date/Time   NA 140 09/07/2023 1210   K 4.2 09/07/2023 1210   CL 101 09/07/2023 1210   CO2 30 09/07/2023 1210   GLUCOSE 78 09/07/2023 1210   BUN 14 09/07/2023 1210   CREATININE 1.14 09/07/2023 1210   CALCIUM 9.3 09/07/2023 1210   PROT 7.1 09/07/2023 1210   PROT 6.9 06/10/2023 1028   ALBUMIN 4.4 09/07/2023 1210   AST 21 09/07/2023 1210   ALT 17  09/07/2023 1210   ALKPHOS 81 09/07/2023 1210   BILITOT 0.7 09/07/2023 1210   GFRNONAA >60 11/02/2022 1856   GFRAA >90 08/06/2014 0240      Latest Ref Rng & Units 09/07/2023   12:10 PM 11/02/2022    6:56 PM 09/12/2022   10:15 AM  BMP  Glucose 70 - 99 mg/dL 78  161  096   BUN 6 - 23 mg/dL 14  16  19    Creatinine 0.40 - 1.50 mg/dL 0.45  4.09  8.11   Sodium 135 - 145 mEq/L 140  136  138   Potassium 3.5 - 5.1 mEq/L 4.2  3.8  4.2   Chloride 96 - 112 mEq/L 101  99  98   CO2 19 - 32 mEq/L 30  26  32   Calcium 8.4 - 10.5 mg/dL 9.3  9.4  9.8        Component Value Date/Time   WBC 7.8 09/07/2023 1210   RBC 5.31 09/07/2023 1210   HGB 15.9 09/07/2023 1210   HCT 47.6 09/07/2023 1210   PLT 193.0 09/07/2023 1210   MCV 89.7 09/07/2023 1210   MCV 93.0 12/30/2013 1537   MCH 28.5 11/02/2022 1856   MCHC 33.5 09/07/2023  1210   RDW 14.2 09/07/2023 1210   LYMPHSABS 2.5 09/07/2023 1210   MONOABS 0.6 09/07/2023 1210   EOSABS 0.1 09/07/2023 1210   BASOSABS 0.0 09/07/2023 1210     Parts of this note may have been dictated using voice recognition software. There may be variances in spelling and vocabulary which are unintentional. Not all errors are proofread. Please notify the Thereasa Parkin if any discrepancies are noted or if the meaning of any statement is not clear.

## 2023-10-22 NOTE — Patient Instructions (Signed)

## 2023-10-23 ENCOUNTER — Ambulatory Visit: Payer: Medicare PPO

## 2023-10-26 ENCOUNTER — Other Ambulatory Visit: Payer: Self-pay | Admitting: Internal Medicine

## 2023-10-26 ENCOUNTER — Other Ambulatory Visit: Payer: Self-pay | Admitting: Neurology

## 2023-10-26 DIAGNOSIS — E1142 Type 2 diabetes mellitus with diabetic polyneuropathy: Secondary | ICD-10-CM

## 2023-10-26 DIAGNOSIS — R269 Unspecified abnormalities of gait and mobility: Secondary | ICD-10-CM | POA: Diagnosis not present

## 2023-10-26 DIAGNOSIS — M6281 Muscle weakness (generalized): Secondary | ICD-10-CM | POA: Diagnosis not present

## 2023-10-26 MED ORDER — TAPENTADOL HCL 50 MG PO TABS: 50.0000 mg | ORAL_TABLET | Freq: Three times a day (TID) | ORAL | 0 refills | Status: DC

## 2023-10-26 NOTE — Telephone Encounter (Signed)
 Copied from CRM [id number]. Topic: Clinical - Medication Refill >> Oct 26, 2023  8:28 AM Truddie Crumble wrote: Most Recent Primary Care Visit:  Provider: Corwin Levins  Department: [name]  Visit Type: OFFICE VISIT  Date: 09/07/2023  Medication: LORazepam (ATIVAN) 1 MG tablet  Has the patient contacted their pharmacy? Yes (Agent: If no, request that the patient contact the pharmacy for the refill. If patient does not wish to contact the pharmacy document the reason why and proceed with request.) (Agent: If yes, when and what did the pharmacy advise?)  Is this the correct pharmacy for this prescription? Yes If no, delete pharmacy and type the correct one.  This is the patient's preferred pharmacy:  [id number] - [place], [place] - [address] AT [facility] [address] [place] [address] Phone: [phone] Fax: [phone]  Has the prescription been filled recently? No  Is the patient out of the medication? Yes  Has the patient been seen for an appointment in the last year OR does the patient have an upcoming appointment? Yes  Can we respond through MyChart? No  Agent: Please be advised that Rx refills may take up to 3 business days. We ask that you follow-up with your pharmacy.

## 2023-10-26 NOTE — Telephone Encounter (Signed)
 Lorazepam too soon- should already have refills

## 2023-10-26 NOTE — Telephone Encounter (Signed)
 Pt request refill for tapentadol (NUCYNTA) 50 MG tablet send to [id number] -

## 2023-10-29 DIAGNOSIS — M6281 Muscle weakness (generalized): Secondary | ICD-10-CM | POA: Diagnosis not present

## 2023-10-29 DIAGNOSIS — R269 Unspecified abnormalities of gait and mobility: Secondary | ICD-10-CM | POA: Diagnosis not present

## 2023-11-02 DIAGNOSIS — R269 Unspecified abnormalities of gait and mobility: Secondary | ICD-10-CM | POA: Diagnosis not present

## 2023-11-02 DIAGNOSIS — M6281 Muscle weakness (generalized): Secondary | ICD-10-CM | POA: Diagnosis not present

## 2023-11-04 ENCOUNTER — Encounter: Payer: Self-pay | Admitting: "Endocrinology

## 2023-11-05 DIAGNOSIS — M6281 Muscle weakness (generalized): Secondary | ICD-10-CM | POA: Diagnosis not present

## 2023-11-05 DIAGNOSIS — R269 Unspecified abnormalities of gait and mobility: Secondary | ICD-10-CM | POA: Diagnosis not present

## 2023-11-12 DIAGNOSIS — R269 Unspecified abnormalities of gait and mobility: Secondary | ICD-10-CM | POA: Diagnosis not present

## 2023-11-12 DIAGNOSIS — M6281 Muscle weakness (generalized): Secondary | ICD-10-CM | POA: Diagnosis not present

## 2023-11-16 DIAGNOSIS — M6281 Muscle weakness (generalized): Secondary | ICD-10-CM | POA: Diagnosis not present

## 2023-11-16 DIAGNOSIS — R269 Unspecified abnormalities of gait and mobility: Secondary | ICD-10-CM | POA: Diagnosis not present

## 2023-11-23 DIAGNOSIS — E119 Type 2 diabetes mellitus without complications: Secondary | ICD-10-CM | POA: Diagnosis not present

## 2023-11-23 DIAGNOSIS — Z961 Presence of intraocular lens: Secondary | ICD-10-CM | POA: Diagnosis not present

## 2023-11-25 ENCOUNTER — Other Ambulatory Visit: Payer: Self-pay | Admitting: Neurology

## 2023-11-25 DIAGNOSIS — E1142 Type 2 diabetes mellitus with diabetic polyneuropathy: Secondary | ICD-10-CM

## 2023-11-25 DIAGNOSIS — R269 Unspecified abnormalities of gait and mobility: Secondary | ICD-10-CM | POA: Diagnosis not present

## 2023-11-25 DIAGNOSIS — M6281 Muscle weakness (generalized): Secondary | ICD-10-CM | POA: Diagnosis not present

## 2023-11-25 MED ORDER — TAPENTADOL HCL 50 MG PO TABS: 50.0000 mg | ORAL_TABLET | Freq: Three times a day (TID) | ORAL | 0 refills | Status: DC

## 2023-11-25 NOTE — Telephone Encounter (Signed)
 Last seen 06/10/23 and next f/u 01/07/24. Last refilled 10/28/23 #90.

## 2023-11-25 NOTE — Telephone Encounter (Signed)
 Pt is requesting a refill for  tapentadol (NUCYNTA) 50 MG tablet .  [facility] ID: [phone]

## 2023-12-04 ENCOUNTER — Encounter (HOSPITAL_COMMUNITY): Payer: Self-pay

## 2023-12-04 ENCOUNTER — Other Ambulatory Visit: Payer: Self-pay

## 2023-12-04 ENCOUNTER — Emergency Department (HOSPITAL_COMMUNITY)
Admission: EM | Admit: 2023-12-04 | Discharge: 2023-12-04 | Disposition: A | Discharge status: Home / Self Care | Attending: Emergency Medicine | Admitting: Emergency Medicine

## 2023-12-04 ENCOUNTER — Emergency Department (HOSPITAL_COMMUNITY)

## 2023-12-04 DIAGNOSIS — Z79899 Other long term (current) drug therapy: Secondary | ICD-10-CM | POA: Insufficient documentation

## 2023-12-04 DIAGNOSIS — E1142 Type 2 diabetes mellitus with diabetic polyneuropathy: Secondary | ICD-10-CM | POA: Diagnosis not present

## 2023-12-04 DIAGNOSIS — W19XXXA Unspecified fall, initial encounter: Secondary | ICD-10-CM | POA: Diagnosis not present

## 2023-12-04 DIAGNOSIS — Z7984 Long term (current) use of oral hypoglycemic drugs: Secondary | ICD-10-CM | POA: Insufficient documentation

## 2023-12-04 DIAGNOSIS — S32038A Other fracture of third lumbar vertebra, initial encounter for closed fracture: Secondary | ICD-10-CM | POA: Insufficient documentation

## 2023-12-04 DIAGNOSIS — K760 Fatty (change of) liver, not elsewhere classified: Secondary | ICD-10-CM | POA: Diagnosis not present

## 2023-12-04 DIAGNOSIS — M5126 Other intervertebral disc displacement, lumbar region: Secondary | ICD-10-CM | POA: Diagnosis not present

## 2023-12-04 DIAGNOSIS — R319 Hematuria, unspecified: Secondary | ICD-10-CM | POA: Diagnosis not present

## 2023-12-04 DIAGNOSIS — S32009A Unspecified fracture of unspecified lumbar vertebra, initial encounter for closed fracture: Secondary | ICD-10-CM | POA: Diagnosis not present

## 2023-12-04 DIAGNOSIS — M47816 Spondylosis without myelopathy or radiculopathy, lumbar region: Secondary | ICD-10-CM | POA: Diagnosis not present

## 2023-12-04 DIAGNOSIS — S3992XA Unspecified injury of lower back, initial encounter: Secondary | ICD-10-CM | POA: Diagnosis present

## 2023-12-04 DIAGNOSIS — S32028A Other fracture of second lumbar vertebra, initial encounter for closed fracture: Secondary | ICD-10-CM | POA: Diagnosis not present

## 2023-12-04 DIAGNOSIS — M48061 Spinal stenosis, lumbar region without neurogenic claudication: Secondary | ICD-10-CM | POA: Diagnosis not present

## 2023-12-04 DIAGNOSIS — I10 Essential (primary) hypertension: Secondary | ICD-10-CM | POA: Diagnosis not present

## 2023-12-04 DIAGNOSIS — R109 Unspecified abdominal pain: Secondary | ICD-10-CM | POA: Diagnosis not present

## 2023-12-04 LAB — BASIC METABOLIC PANEL WITH GFR
Anion gap: 9 (ref 5–15)
BUN: 18 mg/dL (ref 8–23)
CO2: 27 mmol/L (ref 22–32)
Calcium: 9.1 mg/dL (ref 8.9–10.3)
Chloride: 101 mmol/L (ref 98–111)
Creatinine, Ser: 1.02 mg/dL (ref 0.61–1.24)
GFR, Estimated: >60 mL/min (ref >60)
Glucose, Bld: 83 mg/dL (ref 70–99)
Potassium: 3.6 mmol/L (ref 3.5–5.1)
Sodium: 137 mmol/L (ref 135–145)

## 2023-12-04 LAB — CBC WITH DIFFERENTIAL/PLATELET
Abs Immature Granulocytes: 0.02 10*3/uL (ref 0.00–0.07)
Basophils Absolute: 0 10*3/uL (ref 0.0–0.1)
Basophils Relative: 1 %
Eosinophils Absolute: 0.1 10*3/uL (ref 0.0–0.5)
Eosinophils Relative: 2 %
HCT: 44.6 % (ref 39.0–52.0)
Hemoglobin: 15.2 g/dL (ref 13.0–17.0)
Immature Granulocytes: 0 %
Lymphocytes Relative: 30 %
Lymphs Abs: 2.4 10*3/uL (ref 0.7–4.0)
MCH: 30.5 pg (ref 26.0–34.0)
MCHC: 34.1 g/dL (ref 30.0–36.0)
MCV: 89.6 fL (ref 80.0–100.0)
Monocytes Absolute: 0.6 10*3/uL (ref 0.1–1.0)
Monocytes Relative: 8 %
Neutro Abs: 4.7 10*3/uL (ref 1.7–7.7)
Neutrophils Relative %: 59 %
Platelets: 147 10*3/uL — ABNORMAL LOW (ref 150–400)
RBC: 4.98 MIL/uL (ref 4.22–5.81)
RDW: 13.2 % (ref 11.5–15.5)
WBC: 7.9 10*3/uL (ref 4.0–10.5)
nRBC: 0 % (ref 0.0–0.2)

## 2023-12-04 LAB — URINALYSIS, W/ REFLEX TO CULTURE (INFECTION SUSPECTED)
Bacteria, UA: NONE SEEN
Bilirubin Urine: NEGATIVE
Glucose, UA: >=500 mg/dL — AB
Ketones, ur: NEGATIVE mg/dL
Leukocytes,Ua: NEGATIVE
Nitrite: NEGATIVE
Protein, ur: NEGATIVE mg/dL
RBC / HPF: >50 RBC/hpf (ref 0–5)
Specific Gravity, Urine: 1.013 (ref 1.005–1.030)
pH: 6 (ref 5.0–8.0)

## 2023-12-04 MED ORDER — SODIUM CHLORIDE 0.9 % IV SOLN
1.0000 g | Freq: Once | INTRAVENOUS | Status: AC
  Administered 2023-12-04: 1 g via INTRAVENOUS
  Filled 2023-12-04: qty 10

## 2023-12-04 MED ORDER — OXYCODONE HCL 5 MG PO TABS: 5.0000 mg | ORAL_TABLET | ORAL | 0 refills | Status: DC | PRN

## 2023-12-04 MED ORDER — LACTATED RINGERS IV BOLUS
1000.0000 mL | Freq: Once | INTRAVENOUS | Status: AC
  Administered 2023-12-04: 1000 mL via INTRAVENOUS

## 2023-12-04 MED ORDER — IOHEXOL 300 MG/ML  SOLN
100.0000 mL | Freq: Once | INTRAMUSCULAR | Status: AC | PRN
  Administered 2023-12-04: 100 mL via INTRAVENOUS

## 2023-12-04 MED ORDER — DOXYCYCLINE HYCLATE 100 MG PO CAPS: 100.0000 mg | ORAL_CAPSULE | Freq: Two times a day (BID) | ORAL | 0 refills | Status: AC

## 2023-12-04 MED ORDER — CEPHALEXIN 500 MG PO CAPS: 500.0000 mg | ORAL_CAPSULE | Freq: Four times a day (QID) | ORAL | 0 refills | Status: AC

## 2023-12-04 NOTE — ED Triage Notes (Signed)
 Pt reports with hematuria for several days and had a fall last night injuring his right lower back.

## 2023-12-04 NOTE — ED Provider Notes (Signed)
 [place] EMERGENCY DEPARTMENT AT [facility] Provider Note  CSN: [id number] Arrival date & time: 12/04/23 1847  Chief Complaint(s) Fall and Hematuria  HPI Frank [name] is a 68 y.o. male who is here today for blood in his urine.  He reports over the last few days he has had burning with urination as well as a large amount of blood in his urine.  Is not having flank pain.  Has not had a fever.  He also tripped last evening landed on his back and now has pain in his lower back.  Not on blood thinners.  Previously had prostatitis "many years ago" but does not know of any prostate issues at this time.   Past Medical History Past Medical History:  Diagnosis Date   Abdominal pain, left lower quadrant 06/06/2010   Abdominal pain, unspecified site 01/19/2009   ADD 10/09/2008   ALLERGIC RHINITIS 10/09/2008   Allergy    ANXIETY DEPRESSION 02/01/2008   Arthritis    hands ?   Blood transfusion without reported diagnosis    Cataract    removed both eyes    COLONIC POLYPS 02/01/2008   DIABETES MELLITUS, TYPE II 05/20/2010   DYSPNEA 03/12/2010   ERECTILE DYSFUNCTION 10/09/2008   ERECTILE DYSFUNCTION, ORGANIC 05/20/2010   FOOT PAIN, LEFT 05/20/2010   GERD 02/01/2008   Guillain Barr syndrome (HCC)    HEMORRHOIDS 02/01/2008   HIATAL HERNIA 02/01/2008   HYPERLIPIDEMIA 10/09/2008   HYPERTENSION 10/09/2008   MORTON'S NEUROMA 05/20/2010   Other specified forms of hearing loss 06/27/2009   PERIPHERAL EDEMA 05/20/2010   PERIPHERAL NEUROPATHY 05/20/2010   Sleep apnea    stopped cpap    SLEEP APNEA, OBSTRUCTIVE 02/01/2008   Stricture and stenosis of esophagus 02/02/2008   Type II or unspecified type diabetes mellitus without mention of complication, uncontrolled 11/14/2010   WRIST PAIN, LEFT 12/05/2009   Patient Active Problem List   Diagnosis Date Noted   Ulnar nerve entrapment at elbow, left 09/13/2023   Constipation 09/10/2023   Bilateral hearing loss due to cerumen impaction 09/11/2022    B12 deficiency 03/03/2022   Vitamin D  deficiency 06/10/2021   Carpal tunnel syndrome, bilateral 10/01/2016   Excessive daytime sleepiness 06/11/2016   Encounter for well adult exam with abnormal findings 02/20/2016   Polyneuropathy 08/30/2015   Non-compliant behavior 05/16/2015   Dysesthesia 11/15/2014   Carpal tunnel syndrome 11/15/2014   Chronic pain syndrome 11/01/2014   Recurrent GBS (Guillain-Barre syndrome) (HCC) 08/01/2014   C. difficile colitis 07/11/2014   History of ETT    Protein-calorie malnutrition, severe (HCC) 06/30/2014   Ascending paralysis (HCC)    Back pain, thoracic 06/21/2014   Nausea and vomiting 06/21/2014   General weakness 06/21/2014   Gait disorder 06/21/2014   Decreased frequency of bowel movements 06/21/2014   Orthostasis 06/21/2014   Chronic back pain greater than 3 months duration 06/21/2014   Dehydration 06/21/2014   Generalized weakness 06/21/2014   Skin lesion 05/17/2014   Low back pain 05/17/2014   Screen for STD (sexually transmitted disease) 05/17/2014   Closed low lateral malleolus fracture 03/10/2014   Acute gouty arthritis 01/12/2014   Change in bowel habits 08/15/2013   Hemorrhage of rectum and anus 08/15/2013   Umbilical hernia 07/21/2011   Rash 11/25/2010   Foot pain 11/25/2010   Diabetes mellitus due to underlying condition with diabetic neuropathy (HCC) 05/20/2010   MORTON'S NEUROMA 05/20/2010   Diabetic polyneuropathy associated with type 2 diabetes mellitus (HCC) 05/20/2010   ERECTILE  DYSFUNCTION, ORGANIC 05/20/2010   PERIPHERAL EDEMA 05/20/2010   Hyperlipidemia 10/09/2008   Attention deficit disorder 10/09/2008   Essential hypertension 10/09/2008   ALLERGIC RHINITIS 10/09/2008   Stricture and stenosis of esophagus 02/02/2008   Dysthymic disorder 02/01/2008   Obstructive sleep apnea 02/01/2008   HEMORRHOIDS 02/01/2008   GERD 02/01/2008   HIATAL HERNIA 02/01/2008   Home Medication(s) Prior to Admission medications    Medication Sig Start Date End Date Taking? Authorizing Provider  cephALEXin  (KEFLEX ) 500 MG capsule Take 1 capsule (500 mg total) by mouth 4 (four) times daily for 7 days. 12/04/23 12/11/23 Yes Afton Horse T, DO  doxycycline  (VIBRAMYCIN ) 100 MG capsule Take 1 capsule (100 mg total) by mouth 2 (two) times daily for 14 days. 12/04/23 12/18/23 Yes Afton Horse T, DO  oxyCODONE  (ROXICODONE ) 5 MG immediate release tablet Take 1 tablet (5 mg total) by mouth every 4 (four) hours as needed for severe pain (pain score 7-10). 12/04/23  Yes Afton Horse T, DO  Accu-Chek Softclix Lancets lancets Use to check blood sugars daily 05/29/22   Lajean Pike, MD  ARIPiprazole  (ABILIFY ) 10 MG tablet TAKE 1 TABLET EVERY DAY 08/21/23   Debbra Fairy, MD  Continuous Blood Gluc Sensor (DEXCOM G7 SENSOR) MISC Change every 10 days 05/13/22   Lajean Pike, MD  cyclobenzaprine  (FLEXERIL ) 5 MG tablet TAKE 1 TABLET AT BEDTIME 08/21/23   Athar, Saima, MD  dapagliflozin  propanediol (FARXIGA ) 10 MG TABS tablet Take 1 tablet (10 mg total) by mouth daily before breakfast. 09/03/23   Motwani, Komal, MD  DROPLET PEN NEEDLES 31G X 8 MM MISC USE TO INJECT TRESIBA  UNDER THE SKIN EVERY DAY AS DIRECTED 02/17/23   Lajean Pike, MD  DULoxetine  (CYMBALTA ) 60 MG capsule TAKE 1 CAPSULE TWICE DAILY 08/21/23   Athar, Saima, MD  glipiZIDE  (GLUCOTROL  XL) 10 MG 24 hr tablet Take 1 tablet (10 mg total) by mouth daily with breakfast. 09/03/23 12/02/23  Motwani, Komal, MD  glucose blood (ACCU-CHEK GUIDE) test strip Use to check blood sugars daily 05/29/22   Lajean Pike, MD  hydrochlorothiazide  (MICROZIDE ) 12.5 MG capsule TAKE 1 CAPSULE BY MOUTH EVERY DAY 10/06/23   Roslyn Coombe, MD  LORazepam  (ATIVAN ) 1 MG tablet TAKE 1 TABLET BY MOUTH TWICE A DAY AS NEEDED 09/24/23   Roslyn Coombe, MD  methylphenidate  (CONCERTA ) 36 MG PO CR tablet Take 1 tablet (36 mg total) by mouth daily. 09/30/23   Roslyn Coombe, MD  omeprazole (PRILOSEC OTC) 20 MG tablet Take 20 mg by mouth  daily.    [provider]  pregabalin  (LYRICA ) 200 MG capsule TAKE 1 CAPSULE TWICE DAILY 08/21/23   Athar, Saima, MD  rosuvastatin  (CRESTOR ) 20 MG tablet TAKE 1 TABLET BY MOUTH EVERY DAY 10/06/23   Roslyn Coombe, MD  sildenafil  (VIAGRA ) 100 MG tablet Take 0.5-1 tablets (50-100 mg total) by mouth daily as needed for erectile dysfunction. 03/09/23   Roslyn Coombe, MD  tapentadol  (NUCYNTA ) 50 MG tablet Take 1 tablet (50 mg total) by mouth 3 (three) times daily. 11/25/23   Sater, Sherida Dimmer, MD  tirzepatide [facility]) 10 MG/0.5ML Pen Inject 10 mg into the skin once a week. 10/22/23   Motwani, Komal, MD  TRESIBA  FLEXTOUCH 200 UNIT/ML FlexTouch Pen INJECT 74 UNITS UNDER THE SKIN EVERY DAY 02/11/23   Jorge Newcomer, MD  Past Surgical History Past Surgical History:  Procedure Laterality Date   CARPAL TUNNEL RELEASE Bilateral    COLONOSCOPY     DENTAL SURGERY     ESOPHAGEAL DILATION  july 2009   ESOPHAGOGASTRODUODENOSCOPY N/A 06/27/2014   Procedure: ESOPHAGOGASTRODUODENOSCOPY (EGD);  Surgeon: Pietro Bridegroom, MD;  Location: [facility];  Service: Endoscopy;  Laterality: N/A;   EYE SURGERY     catract surgery on both eyes   POLYPECTOMY     ROTATOR CUFF REPAIR     UPPER GASTROINTESTINAL ENDOSCOPY     Family History Family History  Problem Relation Age of Onset   Diabetes Mother    Heart disease Mother    Hyperlipidemia Mother    Depression Mother    Diabetes Brother    Colon cancer Neg Hx    Colon polyps Neg Hx    Esophageal cancer Neg Hx    Rectal cancer Neg Hx    Stomach cancer Neg Hx     Social History Social History   Tobacco Use   Smoking status: Never   Smokeless tobacco: Never  Vaping Use   Vaping status: Never Used  Substance Use Topics   Alcohol use: Yes    Comment: occasional   Drug use: Yes    Types: Methaqualone   Allergies Codeine,  Influenza vaccines, Metformin  and related, Other, and Shellfish allergy  Review of Systems Review of Systems  Physical Exam Vital Signs  I have reviewed the triage vital signs BP 113/68 (BP Location: Right Arm)   Pulse (!) 107   Temp 99 F (37.2 C) (Oral)   Resp 18   Ht 5\' 9"  (1.753 m)   Wt 97.1 kg   SpO2 98%   BMI 31.60 kg/m   Physical Exam Vitals reviewed.  Eyes:     Pupils: Pupils are equal, round, and reactive to light.  Abdominal:     General: Abdomen is flat. There is no distension.     Palpations: Abdomen is soft.     Tenderness: There is no abdominal tenderness. There is no right CVA tenderness, left CVA tenderness or guarding.  Musculoskeletal:     Cervical back: Normal range of motion.  Neurological:     General: No focal deficit present.     Mental Status: He is alert.     ED Results and Treatments Labs (all labs ordered are listed, but only abnormal results are displayed) Labs Reviewed  CBC WITH DIFFERENTIAL/PLATELET - Abnormal; Notable for the following components:      Result Value   Platelets 147 (*)    All other components within normal limits  URINALYSIS, W/ REFLEX TO CULTURE (INFECTION SUSPECTED) - Abnormal; Notable for the following components:   Glucose, UA >=500 (*)    Hgb urine dipstick LARGE (*)    All other components within normal limits  URINE CULTURE  BASIC METABOLIC PANEL WITH GFR  GC/CHLAMYDIA PROBE AMP ([place]) NOT AT [facility]  Radiology CT L-SPINE NO CHARGE Result Date: 12/04/2023 CLINICAL DATA:  Hematuria.  Fall last night. EXAM: CT LUMBAR SPINE WITHOUT CONTRAST TECHNIQUE: Multidetector CT imaging of the lumbar spine was performed without intravenous contrast administration. Multiplanar CT image reconstructions were also generated. RADIATION DOSE REDUCTION: This exam was performed according to the departmental  dose-optimization program which includes automated exposure control, adjustment of the mA and/or kV according to patient size and/or use of iterative reconstruction technique. COMPARISON:  CT of the abdomen pelvis 11/02/2022. MRI of the lumbar spine 06/20/2014 FINDINGS: Segmentation: A transitional L5 segment is noted. Alignment: Slight degenerative retrolisthesis at L4-5 is stable. No other significant listhesis is present. Straightening of the normal lumbar lordosis is stable. Vertebrae: Chronic endplate degenerative changes are again noted at L3-4. Minimal endplate changes are noted anteriorly at L1-2. Vertebral body heights are normal. Paraspinal and other soft tissues: Atherosclerotic calcifications are present in the aorta and branch vessels. No aneurysm is present. See dedicated report for CT abdomen for further description. Disc levels: T12-L1: Mild facet hypertrophy is present. No focal disc protrusion or stenosis is present. L1-2: Mild disc bulging and facet hypertrophy is present. No focal stenosis is present. L2-3: A broad-based disc protrusion is present. Mild left foraminal narrowing is present. L3-4: A broad-based disc protrusion is present. Facet hypertrophy and ligamentum flavum thickening is worse on the left. Mild left subarticular narrowing is present. Severe left and moderate right foraminal stenosis is present. L4-5: A rightward disc protrusion is present. Moderate right foraminal stenosis is present. Central canal is patent. L5-S1: Mild facet hypertrophy is present bilaterally. Ankylosis is present across the disc space. No focal stenosis is present. IMPRESSION: 1. No acute fracture or traumatic subluxation. 2. Multilevel degenerative changes of the lumbar spine as described. 3. Mild left foraminal narrowing at L2-3. 4. Mild left subarticular narrowing at L3-4 with severe left and moderate right foraminal stenosis. 5. Moderate right foraminal stenosis at L4-5. 6. Ankylosis across the disc  space at L5-S1. Electronically Signed   By: Audree Leas M.D.   On: 12/04/2023 20:48   CT ABDOMEN PELVIS W CONTRAST Result Date: 12/04/2023 CLINICAL DATA:  Acute nonlocalized abdominal pain, hematuria, fall EXAM: CT ABDOMEN AND PELVIS WITH CONTRAST TECHNIQUE: Multidetector CT imaging of the abdomen and pelvis was performed using the standard protocol following bolus administration of intravenous contrast. RADIATION DOSE REDUCTION: This exam was performed according to the departmental dose-optimization program which includes automated exposure control, adjustment of the mA and/or kV according to patient size and/or use of iterative reconstruction technique. CONTRAST:  OMNIPAQUE  IOHEXOL  300 MG/ML  SOLN COMPARISON:  11/02/2022 FINDINGS: Lower chest: No acute abnormality. Hepatobiliary: Stable cyst with left hepatic lobe. Mild hepatic steatosis. No intra or extrahepatic biliary ductal dilation. Gallbladder unremarkable. Pancreas: Unremarkable Spleen: Unremarkable Adrenals/Urinary Tract: Adrenal glands are unremarkable. Stable simple cortical cysts are seen within the kidneys bilaterally for which no follow-up imaging is recommended. The kidneys are otherwise unremarkable. Bladder unremarkable. Stomach/Bowel: Stomach is within normal limits. Appendix appears normal. No evidence of bowel wall thickening, distention, or inflammatory changes. Vascular/Lymphatic: Aortic atherosclerosis. No enlarged abdominal or pelvic lymph nodes. Reproductive: Prostate is unremarkable. Other: No abdominal wall hernia or abnormality. No abdominopelvic ascites. Musculoskeletal: There are mildly displaced acute fracture of the right L2 and L3 transverse processes. No other fracture or dislocation identified. Degenerative changes are seen within the visualized thoracolumbar spine. No lytic or blastic bone lesion is identified. IMPRESSION: 1. Mildly displaced acute fracture of the right L2 and L3  transverse processes. 2. Mild  hepatic steatosis. Aortic Atherosclerosis (ICD10-I70.0). Electronically Signed   By: Worthy Heads M.D.   On: 12/04/2023 20:45    Pertinent labs & imaging results that were available during my care of the patient were reviewed by me and considered in my medical decision making (see MDM for details).  Medications Ordered in ED Medications  cefTRIAXone  (ROCEPHIN ) 1 g in sodium chloride  0.9 % 100 mL IVPB (1 g Intravenous New Bag/Given 12/04/23 2243)  lactated ringers  bolus 1,000 mL (0 mLs Intravenous Stopped 12/04/23 2241)  iohexol  (OMNIPAQUE ) 300 MG/ML solution 100 mL (100 mLs Intravenous Contrast Given 12/04/23 2023)                                                                                                                                     Procedures Procedures  (including critical care time)  Medical Decision Making / ED Course   This patient presents to the ED for concern of hematuria, this involves an extensive number of treatment options, and is a complaint that carries with it a high risk of complications and morbidity.  The differential diagnosis includes cystitis, pyelonephritis, nephrolithiasis, less likely bladder hemorrhage.  MDM: Will check urinalysis on patient, basic labs, CT imaging of patient's abdomen pelvis.  Will obtain imaging of patient's L-spine given the fall.  Patient without any neurological deficits, normal strength and sensation in the bilateral lower extremities.  Patient has an area of tenderness on the lower right flank.  Reassessment 11 PM-patient CT imaging shows L2-L3 transverse process fractures.  Will treat symptomatically, have patient follow-up with his PCP.  Patient without obvious infection his urine, however given his symptoms I believe it is appropriate to treat for cystitis and prostatitis.  Will discharge patient on Keflex , doxycycline .  Will discharge.   Additional history obtained: -Additional history obtained from partner at  bedside -External records from outside source obtained and reviewed including: Chart review including previous notes, labs, imaging, consultation notes   Lab Tests: -I ordered, reviewed, and interpreted labs.   The pertinent results include:   Labs Reviewed  CBC WITH DIFFERENTIAL/PLATELET - Abnormal; Notable for the following components:      Result Value   Platelets 147 (*)    All other components within normal limits  URINALYSIS, W/ REFLEX TO CULTURE (INFECTION SUSPECTED) - Abnormal; Notable for the following components:   Glucose, UA >=500 (*)    Hgb urine dipstick LARGE (*)    All other components within normal limits  URINE CULTURE  BASIC METABOLIC PANEL WITH GFR  GC/CHLAMYDIA PROBE AMP ([place]) NOT AT [facility]      EKG   EKG Interpretation Date/Time:    Ventricular Rate:    PR Interval:    QRS Duration:    QT Interval:    QTC Calculation:   R Axis:      Text Interpretation:  Imaging Studies ordered: I ordered imaging studies including CT abdomen pelvis I independently visualized and interpreted imaging. I agree with the radiologist interpretation   Medicines ordered and prescription drug management: Meds ordered this encounter  Medications   lactated ringers bolus 1,000 mL   iohexol  (OMNIPAQUE ) 300 MG/ML solution 100 mL   cefTRIAXone  (ROCEPHIN ) 1 g in sodium chloride  0.9 % 100 mL IVPB    Antibiotic Indication::   UTI   doxycycline  (VIBRAMYCIN ) 100 MG capsule    Sig: Take 1 capsule (100 mg total) by mouth 2 (two) times daily for 14 days.    Dispense:  28 capsule    Refill:  0   cephALEXin  (KEFLEX ) 500 MG capsule    Sig: Take 1 capsule (500 mg total) by mouth 4 (four) times daily for 7 days.    Dispense:  28 capsule    Refill:  0   oxyCODONE  (ROXICODONE ) 5 MG immediate release tablet    Sig: Take 1 tablet (5 mg total) by mouth every 4 (four) hours as needed for severe pain (pain score 7-10).    Dispense:  10 tablet    Refill:  0    -I  have reviewed the patients home medicines and have made adjustments as needed   Cardiac Monitoring: The patient was maintained on a cardiac monitor.  I personally viewed and interpreted the cardiac monitored which showed an underlying rhythm of: Normal sinus rhythm  Social Determinants of Health:  Factors impacting patients care include: Multiple medical comorbidities including diabetes   Reevaluation: After the interventions noted above, I reevaluated the patient and found that they have :improved  Co morbidities that complicate the patient evaluation  Past Medical History:  Diagnosis Date   Abdominal pain, left lower quadrant 06/06/2010   Abdominal pain, unspecified site 01/19/2009   ADD 10/09/2008   ALLERGIC RHINITIS 10/09/2008   Allergy    ANXIETY DEPRESSION 02/01/2008   Arthritis    hands ?   Blood transfusion without reported diagnosis    Cataract    removed both eyes    COLONIC POLYPS 02/01/2008   DIABETES MELLITUS, TYPE II 05/20/2010   DYSPNEA 03/12/2010   ERECTILE DYSFUNCTION 10/09/2008   ERECTILE DYSFUNCTION, ORGANIC 05/20/2010   FOOT PAIN, LEFT 05/20/2010   GERD 02/01/2008   Guillain Barr syndrome (HCC)    HEMORRHOIDS 02/01/2008   HIATAL HERNIA 02/01/2008   HYPERLIPIDEMIA 10/09/2008   HYPERTENSION 10/09/2008   MORTON'S NEUROMA 05/20/2010   Other specified forms of hearing loss 06/27/2009   PERIPHERAL EDEMA 05/20/2010   PERIPHERAL NEUROPATHY 05/20/2010   Sleep apnea    stopped cpap    SLEEP APNEA, OBSTRUCTIVE 02/01/2008   Stricture and stenosis of esophagus 02/02/2008   Type II or unspecified type diabetes mellitus without mention of complication, uncontrolled 11/14/2010   WRIST PAIN, LEFT 12/05/2009      Dispostion: I considered admission for this patient, however with patient's reassuring vital signs is appropriate for outpatient follow-up.     Final Clinical Impression(s) / ED Diagnoses Final diagnoses:  Hematuria, unspecified type  Closed fracture of transverse  process of lumbar vertebra, initial encounter [facility])     @PCDICTATION @    Afton Horse T, DO 12/04/23 2310

## 2023-12-04 NOTE — Discharge Instructions (Addendum)
 While you were in the emergency room, you had a CT scan done of your abdomen and back did show you broke 2 bones in your back.  These do not require any surgery, and should heal fine on their own with time.  I sent a prescription for some medications that you can take as needed for severe pain in your back.  Regarding the blood that you have in your urine, we are going to treat that with antibiotics.  You can take Keflex  4 times per day for the next 1 week.  You may begin taking this tomorrow.  He can also take doxycycline  once in the morning once in the evening for the next 2 weeks.  I have included the telephone number for a urologist.  If you continue have blood in your urine, please follow-up with them.  Please call your primary care doctor on Monday for follow-up appointment.

## 2023-12-06 LAB — URINE CULTURE: Culture: NO GROWTH

## 2023-12-07 LAB — GC/CHLAMYDIA PROBE AMP (~~LOC~~) NOT AT ARMC
Chlamydia: NEGATIVE
Comment: NEGATIVE
Comment: NORMAL
Neisseria Gonorrhea: NEGATIVE

## 2023-12-08 ENCOUNTER — Telehealth: Payer: Self-pay | Admitting: Urology

## 2023-12-08 NOTE — Telephone Encounter (Signed)
 LVM for patient to return call to get scheduled

## 2023-12-08 NOTE — Telephone Encounter (Signed)
-----   Message from [facility] Crysta A sent at 12/07/2023 12:08 PM EDT ----- You can do 5/20 @ 8:30 AM ----- Message ----- From: Adriane Albe Sent: 12/07/2023   9:40 AM EDT To: General Kenner, CMA; Alyson Jasiak  Start using the 8:30 or 8:45 spots ----- Message ----- From: Lorrene Rosser Sent: 12/07/2023   8:41 AM EDT To: Johanna Mutter, CMA; #  The week of May 19th, where would you like me to put this patient? ----- Message ----- From: Mellie Sprinkle., MD Sent: 12/05/2023  11:21 AM EDT To: Lucetta Russel  Please schedule appt for ER f/u for hematuria.  May be in 2-3 weeks.

## 2023-12-10 ENCOUNTER — Other Ambulatory Visit: Payer: Self-pay | Admitting: "Endocrinology

## 2023-12-17 DIAGNOSIS — E1140 Type 2 diabetes mellitus with diabetic neuropathy, unspecified: Secondary | ICD-10-CM | POA: Diagnosis not present

## 2023-12-22 ENCOUNTER — Other Ambulatory Visit: Payer: Self-pay | Admitting: Neurology

## 2023-12-22 DIAGNOSIS — E1142 Type 2 diabetes mellitus with diabetic polyneuropathy: Secondary | ICD-10-CM

## 2023-12-22 MED ORDER — TAPENTADOL HCL 50 MG PO TABS: 50.0000 mg | ORAL_TABLET | Freq: Three times a day (TID) | ORAL | 0 refills | Status: DC

## 2023-12-22 NOTE — Telephone Encounter (Signed)
 Pt is requesting a refill for  tapentadol (NUCYNTA) 50 MG tablet .  [facility] ID: [phone]

## 2023-12-22 NOTE — Telephone Encounter (Signed)
 Last 06/10/23 and next f/u 01/07/24.  Last refilled 11/27/23 #90.

## 2023-12-28 ENCOUNTER — Other Ambulatory Visit: Payer: Self-pay | Admitting: *Deleted

## 2023-12-28 DIAGNOSIS — E1142 Type 2 diabetes mellitus with diabetic polyneuropathy: Secondary | ICD-10-CM

## 2023-12-28 MED ORDER — TAPENTADOL HCL 50 MG PO TABS
50.0000 mg | ORAL_TABLET | Freq: Three times a day (TID) | ORAL | 0 refills | Status: DC
Start: 2023-12-28 — End: 2024-01-07

## 2023-12-28 NOTE — Telephone Encounter (Signed)
 Last seen on 06/10/23 Follow up scheduled on 01/07/24  Nucynta  50 mg tablet 12/26/2023 10 30 tablet Sater, Sherida Dimmer, MD [facility] [phone] - [facility]...    Pharmacy attached note stating pt was only able to get 10 day supply on 12/27/23 due to Rx being out of stock.  Pt will need a new Rx for remaining 20 day supply (60 tablets)  I have Rx pending to be signed

## 2024-01-06 NOTE — Patient Instructions (Signed)
Below is our plan:  We will continue current treatment plan.   Please make sure you are staying well hydrated. I recommend 50-60 ounces daily. Well balanced diet and regular exercise encouraged. Consistent sleep schedule with 6-8 hours recommended.   Please continue follow up with care team as directed.   Follow up with Dr Sater in 4 months   You may receive a survey regarding today's visit. I encourage you to leave honest feed back as I do use this information to improve patient care. Thank you for seeing me today!    

## 2024-01-06 NOTE — Progress Notes (Unsigned)
 PATIENT: Frank [name] DOB: June 22, 1956  REASON FOR VISIT: follow up HISTORY FROM: patient  No chief complaint on file.   HISTORY OF PRESENT ILLNESS:  01/06/24 ALL:  Frank [name] returns for follow up for chronic pain and diabetic neuropathy. He was last seen by Dr Godwin Lat 05/2023. NCS/EMG 08/2023 showed known polyneuropathy, left ulnar nerve compression. Referral placed to Dr Felipe Horton, NS.   Since,   Nucynta  50mg  TID, Lyrica  200mg  BID, duloxetine  60mg  BID, Abilify  10mg  QD and cyclobenzaprine  5mg  QHS continued.  12/08/2022 ALL:  Frank [name] returns for follow up for chronic pain and diabetic neuropathy. He was last seen by Dr Godwin Lat 06/2022. Nucynta  was reduced to 50mg  TID for safety. Lyrica  200mg  BID, duloxetine  60mg  BID, Abilify  10mg  QD and cyclobenzaprine  5mg  QHS continued. He reports neuropathy pain has worsened on lower doses of Nucynta . He has more burning pain. Pain seems worse at night. No changes in gait. No assistive device, no falls. He does not sleep well. Unable to tolerate CPAP. PCP continues lorazepam  and methylphenidate . He reports CBGs have been a little elevated. Last A1C was 7.6. Mood is stable.   06/30/2022: He feels he pain is doing about the same as at the last visit.   Pain is up to the knees in the legs and also in fingers.  Quality is burning. Pain is mostly from diabetic polyneuropathy.  He also has carpal tunnel syndrome.  He also feels his muscles are stiff, .   He is on Nucynta  75 mg tid, Lyrica  200 mg po bid and Cymbalta  60 mg po bid.  Nucynta  sometimes causes mild sleepiness.   His PCP prescribes lorazepam  0.5 mg bid.   He is on methylphenidate  ER 36 qAM. The NCCS database shows good compliance. He has a lot of numbness in his feet.   No sores.    No falls.   He needs to hold something if eyes are closed   He is sleeping well.   He has OSA (severe 70.0) but was unable to tolerate CPAP. He has no major weight change since last visit.   He wakes up a couple times a night  due to nocturia but quickly falls back asleep..  We discussed he has severe OSA but did not tolerate CPAP and is not interested in surgery or oral appliance.   He is on Ozempic  but has not lost any weight  12/02/2021 ALL: He reports symptoms are stable. He continues duloxetine , Nucynta  75mg  TID (11/06/2021) and Lyrica  200mg  BID (last filled 10/07/2021). Pain is about the same. No significant changes since last visit. Abilify  10mg  daily and duloxetine  60mg  BID help with mood. PCP writes lorazepam  0.5mg  up to BID (usually takes 1mg  at bedtime). He has severe OSA but can not tolerate PAP therapy. He continues to follow up closely with PCP and endo. Last A1C was 7.2  04/09/2021 RS:  Frank [name] is a 68 year old man with a chronic peripheral neuropathy who had superimposed Guillain Barr syndrome in November 2015 and has had severe neuropathic pain   Update 03/11/2021: He feels he pain is doing about the same.    It is mostly legs, feet and hands.  Pain is mostly from diabetic polyneuropathy.  He also has carpal tunnel syndrome.  He also feels his muscles are stiff, .   He is on Nucynta  75 mg tid, Lyrica  200 mg po bid and Cymbalta  60 mg po bid.    His PCP prescribes lorazepam  0.5 mg bid.   He no longer  takes methylphenidate  ER 54 qAM    The NCCS database shows good compliance.        He has a lot of numbness in his feet.   No sores.    No falls.   He needs to hold something if eyes are closed   He is sleeping well.   He has OSA (severe 70.0) but was unable to tolerate CPAP. He has no major weight change since last visit.   He wakes up a couple times a night due to nocturia but quickly falls back asleep..  We discussed he has severe OSA but did not tolerate CPAP and is not interested in surgery or oral appliance.      EPWORTH SLEEPINESS SCALE 03/11/2021   On a scale of 0 - 3 what is the chance of dozing:   Sitting and Reading:                           1 Watching TV:                                       3 Sitting inactive in a public place:        2 Passenger in car for one hour:           3 Lying down to rest in the afternoon:   3 Sitting and talking to someone:          0 Sitting quietly after lunch:                   3 In a car, stopped in traffic:                  0  Does not drive   Total (out of 24):   15  /24 moderate ESS    B12 was low 2021 but he never supplemented.       GBS History:   The initial episode of GBS was treated with plasmapheresis. He reports that the diagnosis took a couple weeks and he had several emergency room visits. Initially he was sent to a skilled nursing facility. He was reporting a lot of back pain alongside the weakness. A lumbar puncture was eventually done showing high protein which led to the dye diagnosis of GBS.  He had severe weakness at the peak and required intubation for respiratory support. At that time, he was unable to move his legs and could barely move his arms. He improved quite a bit after plasmapheresis and was discharged. However, a couple days after discharge he began to feel weak again and return to the emergency room. He was recently admitted for recurrent GBS. For that hospital stay he did not require intubation but did have a facemask oxygen. He also was treated with IVIG the second time around. He improved and was discharged home. He did outpatient physical therapy. He has had a fairly good in movement of his strength and is able to walk independently. However, he has had more dysesthetic pain.   With the exception of more pain, he otherwise feels very close to his pre-GBS baseline.   Of note, he had a flu shot a few weeks before the first episode of GBS.   MRI's from 06/2014 and 07/2014:    Cervical MRI shows spinal stenosis at C4-C5, C5-C6 and C6-C7. There is no  spinal cord compression. Thoracic spine shows T8-T9 disc extrusion to the left that does not cause spinal cord compression. There are mild degenerative changes in the lumbar spine.  Contrast was not used.   I also reviewed many of the notes and labs from his hospital stay. CSF Protein was greatly elevated at 118 and the CSF white blood cell count was 1.  REVIEW OF SYSTEMS: Out of a complete 14 system review of symptoms, the patient complains only of the following symptoms, neuropathy, chronic pain, insomnia and all other reviewed systems are negative.  ALLERGIES: Allergies  Allergen Reactions   Codeine Itching    REACTION: Itching   Influenza Vaccines     Due to hx of GBS   Metformin  And Related Other (See Comments)    GI upset, diarrhea   Other Other (See Comments)    Due to hx of GBS- vaccines    Shellfish Allergy     HOME MEDICATIONS: Outpatient Medications Prior to Visit  Medication Sig Dispense Refill   Accu-Chek Softclix Lancets lancets Use to check blood sugars daily 100 each 11   ARIPiprazole  (ABILIFY ) 10 MG tablet TAKE 1 TABLET EVERY DAY 90 tablet 3   Continuous Blood Gluc Sensor (DEXCOM G7 SENSOR) MISC Change every 10 days 3 each 2   cyclobenzaprine  (FLEXERIL ) 5 MG tablet TAKE 1 TABLET AT BEDTIME 90 tablet 3   dapagliflozin  propanediol (FARXIGA ) 10 MG TABS tablet Take 1 tablet (10 mg total) by mouth daily before breakfast. 90 tablet 1   DROPLET PEN NEEDLES 31G X 8 MM MISC USE TO INJECT TRESIBA  UNDER THE SKIN EVERY DAY AS DIRECTED 100 each 3   DULoxetine  (CYMBALTA ) 60 MG capsule TAKE 1 CAPSULE TWICE DAILY 180 capsule 3   glipiZIDE  (GLUCOTROL  XL) 10 MG 24 hr tablet Take 1 tablet (10 mg total) by mouth daily with breakfast. 90 tablet 1   glucose blood (ACCU-CHEK GUIDE) test strip Use to check blood sugars daily 100 strip 11   hydrochlorothiazide  (MICROZIDE ) 12.5 MG capsule TAKE 1 CAPSULE BY MOUTH EVERY DAY 90 capsule 1   LORazepam  (ATIVAN ) 1 MG tablet TAKE 1 TABLET BY MOUTH TWICE A DAY AS NEEDED 60 tablet 5   methylphenidate  (CONCERTA ) 36 MG PO CR tablet Take 1 tablet (36 mg total) by mouth daily. 90 tablet 0   omeprazole (PRILOSEC OTC) 20 MG tablet Take  20 mg by mouth daily.     oxyCODONE  (ROXICODONE ) 5 MG immediate release tablet Take 1 tablet (5 mg total) by mouth every 4 (four) hours as needed for severe pain (pain score 7-10). 10 tablet 0   pregabalin  (LYRICA ) 200 MG capsule TAKE 1 CAPSULE TWICE DAILY 180 capsule 1   rosuvastatin  (CRESTOR ) 20 MG tablet TAKE 1 TABLET BY MOUTH EVERY DAY 90 tablet 1   sildenafil  (VIAGRA ) 100 MG tablet Take 0.5-1 tablets (50-100 mg total) by mouth daily as needed for erectile dysfunction. 5 tablet 11   tapentadol  (NUCYNTA ) 50 MG tablet Take 1 tablet (50 mg total) by mouth 3 (three) times daily. 60 tablet 0   tirzepatide (MOUNJARO) 10 MG/0.5ML Pen INJECT 10 MG INTO THE SKIN ONE TIME PER WEEK 6 mL 0   TRESIBA  FLEXTOUCH 200 UNIT/ML FlexTouch Pen INJECT 74 UNITS UNDER THE SKIN EVERY DAY 36 mL 3   Facility-Administered Medications Prior to Visit  Medication Dose Route Frequency Provider Last Rate Last Admin   dextrose  5 % solution   Intravenous Once Danis, Henry L III, MD  dextrose  50 % solution 25 mL  25 mL Intravenous Once Danis, Henry L III, MD        PAST MEDICAL HISTORY: Past Medical History:  Diagnosis Date   Abdominal pain, left lower quadrant 06/06/2010   Abdominal pain, unspecified site 01/19/2009   ADD 10/09/2008   ALLERGIC RHINITIS 10/09/2008   Allergy    ANXIETY DEPRESSION 02/01/2008   Arthritis    hands ?   Blood transfusion without reported diagnosis    Cataract    removed both eyes    COLONIC POLYPS 02/01/2008   DIABETES MELLITUS, TYPE II 05/20/2010   DYSPNEA 03/12/2010   ERECTILE DYSFUNCTION 10/09/2008   ERECTILE DYSFUNCTION, ORGANIC 05/20/2010   FOOT PAIN, LEFT 05/20/2010   GERD 02/01/2008   Guillain Barr syndrome (HCC)    HEMORRHOIDS 02/01/2008   HIATAL HERNIA 02/01/2008   HYPERLIPIDEMIA 10/09/2008   HYPERTENSION 10/09/2008   MORTON'S NEUROMA 05/20/2010   Other specified forms of hearing loss 06/27/2009   PERIPHERAL EDEMA 05/20/2010   PERIPHERAL NEUROPATHY 05/20/2010   Sleep apnea     stopped cpap    SLEEP APNEA, OBSTRUCTIVE 02/01/2008   Stricture and stenosis of esophagus 02/02/2008   Type II or unspecified type diabetes mellitus without mention of complication, uncontrolled 11/14/2010   WRIST PAIN, LEFT 12/05/2009    PAST SURGICAL HISTORY: Past Surgical History:  Procedure Laterality Date   CARPAL TUNNEL RELEASE Bilateral    COLONOSCOPY     DENTAL SURGERY     ESOPHAGEAL DILATION  july 2009   ESOPHAGOGASTRODUODENOSCOPY N/A 06/27/2014   Procedure: ESOPHAGOGASTRODUODENOSCOPY (EGD);  Surgeon: Pietro Bridegroom, MD;  Location: [facility];  Service: Endoscopy;  Laterality: N/A;   EYE SURGERY     catract surgery on both eyes   POLYPECTOMY     ROTATOR CUFF REPAIR     UPPER GASTROINTESTINAL ENDOSCOPY      FAMILY HISTORY: Family History  Problem Relation Age of Onset   Diabetes Mother    Heart disease Mother    Hyperlipidemia Mother    Depression Mother    Diabetes Brother    Colon cancer Neg Hx    Colon polyps Neg Hx    Esophageal cancer Neg Hx    Rectal cancer Neg Hx    Stomach cancer Neg Hx     SOCIAL HISTORY: Social History   Socioeconomic History   Marital status: Married    Spouse name: Not on file   Number of children: Not on file   Years of education: Not on file   Highest education level: Not on file  Occupational History   Occupation: Housekeeper [employer]    Employer: [employer]  Tobacco Use   Smoking status: Never   Smokeless tobacco: Never  Vaping Use   Vaping status: Never Used  Substance and Sexual Activity   Alcohol use: Yes    Comment: occasional   Drug use: Yes    Types: Methaqualone   Sexual activity: Yes    Comment: before I got sick  Other Topics Concern   Not on file  Social History Narrative   Not on file   Social Drivers of Health   Financial Resource Strain: Not on file  Food Insecurity: Not on file  Transportation Needs: Not on file  Physical Activity: Not on file  Stress: Not on file  Social Connections: Not on  file  Intimate Partner Violence: Not on file      PHYSICAL EXAM  There were no vitals filed for this visit.  There is no height or weight on file to calculate BMI.  Generalized: Well developed, in no acute distress  Cardiology: normal rate and rhythm, no murmur noted Neurological examination  Mentation: Alert oriented to time, place, history taking. Follows all commands speech and language fluent Cranial nerve II-XII: Pupils were equal round reactive to light. Extraocular movements were full, visual field were full on confrontational test. Facial sensation and strength were normal. Uvula tongue midline. Head turning and shoulder shrug  were normal and symmetric. Motor: The motor testing reveals 5 over 5 strength of all 4 extremities. Good symmetric motor tone is noted throughout.  Sensory: Sensory testing is reduced in bilateral lower extremities from foot to knee bilaterally, normal in upper extremities, decreased vibratory sensation at left knee and right ankle. He reports no sensation from 1 inch below patella down to pin prick testing. No skin lesions noted.  Coordination: Cerebellar testing reveals good finger-nose-finger and heel-to-shin bilaterally.  Gait and station: Gait is short. Decreased arm swing. Mildly stooped posture.Tandem not attempted. Romberg is positive.   Reflexes: Deep tendon reflexes are symmetric and normal bilaterally.   DIAGNOSTIC DATA (LABS, IMAGING, TESTING) - I reviewed patient records, labs, notes, testing and imaging myself where available.      No data to display           Lab Results  Component Value Date   WBC 7.9 12/04/2023   HGB 15.2 12/04/2023   HCT 44.6 12/04/2023   MCV 89.6 12/04/2023   PLT 147 (L) 12/04/2023      Component Value Date/Time   NA 137 12/04/2023 1944   K 3.6 12/04/2023 1944   CL 101 12/04/2023 1944   CO2 27 12/04/2023 1944   GLUCOSE 83 12/04/2023 1944   BUN 18 12/04/2023 1944   CREATININE 1.02 12/04/2023 1944    CALCIUM  9.1 12/04/2023 1944   PROT 7.1 09/07/2023 1210   PROT 6.9 06/10/2023 1028   ALBUMIN  4.4 09/07/2023 1210   AST 21 09/07/2023 1210   ALT 17 09/07/2023 1210   ALKPHOS 81 09/07/2023 1210   BILITOT 0.7 09/07/2023 1210   GFRNONAA >60 12/04/2023 1944   GFRAA >90 08/06/2014 0240   Lab Results  Component Value Date   CHOL 121 09/07/2023   HDL 44.60 09/07/2023   LDLCALC 44 09/07/2023   LDLDIRECT 130.0 03/03/2022   TRIG 162.0 (H) 09/07/2023   CHOLHDL 3 09/07/2023   Lab Results  Component Value Date   HGBA1C 8.4 (A) 09/03/2023   Lab Results  Component Value Date   VITAMINB12 293 09/07/2023   Lab Results  Component Value Date   TSH 1.81 09/07/2023       ASSESSMENT AND PLAN 68 y.o. year old male  has a past medical history of Abdominal pain, left lower quadrant (06/06/2010), Abdominal pain, unspecified site (01/19/2009), ADD (10/09/2008), ALLERGIC RHINITIS (10/09/2008), Allergy, ANXIETY DEPRESSION (02/01/2008), Arthritis, Blood transfusion without reported diagnosis, Cataract, COLONIC POLYPS (02/01/2008), DIABETES MELLITUS, TYPE II (05/20/2010), DYSPNEA (03/12/2010), ERECTILE DYSFUNCTION (10/09/2008), ERECTILE DYSFUNCTION, ORGANIC (05/20/2010), FOOT PAIN, LEFT (05/20/2010), GERD (02/01/2008), Guillain Barr syndrome (HCC), HEMORRHOIDS (02/01/2008), HIATAL HERNIA (02/01/2008), HYPERLIPIDEMIA (10/09/2008), HYPERTENSION (10/09/2008), MORTON'S NEUROMA (05/20/2010), Other specified forms of hearing loss (06/27/2009), PERIPHERAL EDEMA (05/20/2010), PERIPHERAL NEUROPATHY (05/20/2010), Sleep apnea, SLEEP APNEA, OBSTRUCTIVE (02/01/2008), Stricture and stenosis of esophagus (02/02/2008), Type II or unspecified type diabetes mellitus without mention of complication, uncontrolled (11/14/2010), and WRIST PAIN, LEFT (12/05/2009). here with   No diagnosis found.   Frank [name] feels that neuropathy pain has worsened since reducing  Nucynta  to 50mg  TID. A1C was elevated from previous follow up. We have discussed correlation  and need for tight diabetic control. I have offered referral to Dr Alessandra Ancona for Qutenza eval. He wishes to hold off for now. May consider capsaicin OTC. Lidocaine  could also be helpful. We will continue Abilify  10mg  daily, duloxetine  60 mg twice daily, Lyrica  200 mg twice daily and Nucynta  50 mg 3 times daily. PDMP shows appropriate refills. We have reviewed common side effects of these medications, specifically, respiratory depression. We have reviewed OSA diagnosis but he is adamant he can not tolerate PAP therapy. He will continue close follow-up with his primary care provider and endocrinology for management of diabetes and other comorbidities.  Adequate hydration, well-balanced diet and regular exercise advised.  He will follow up with Dr. Godwin Lat in 4-6 months, sooner if needed.  He verbalizes understanding and agreement with this plan.   No orders of the defined types were placed in this encounter.    No orders of the defined types were placed in this encounter.    Terrilyn Fick, FNP-C 01/06/2024, 10:17 AM [facility] [address], [address] [place], [place] [address] [phone]

## 2024-01-07 ENCOUNTER — Ambulatory Visit: Payer: Medicare PPO | Admitting: Family Medicine

## 2024-01-07 ENCOUNTER — Encounter: Payer: Self-pay | Admitting: Family Medicine

## 2024-01-07 VITALS — BP 122/60 | HR 100 | Ht 69.0 in | Wt 211.6 lb

## 2024-01-07 DIAGNOSIS — R208 Other disturbances of skin sensation: Secondary | ICD-10-CM | POA: Diagnosis not present

## 2024-01-07 DIAGNOSIS — G5622 Lesion of ulnar nerve, left upper limb: Secondary | ICD-10-CM | POA: Diagnosis not present

## 2024-01-07 DIAGNOSIS — Z7985 Long-term (current) use of injectable non-insulin antidiabetic drugs: Secondary | ICD-10-CM | POA: Diagnosis not present

## 2024-01-07 DIAGNOSIS — G4733 Obstructive sleep apnea (adult) (pediatric): Secondary | ICD-10-CM

## 2024-01-07 DIAGNOSIS — E1142 Type 2 diabetes mellitus with diabetic polyneuropathy: Secondary | ICD-10-CM

## 2024-01-07 DIAGNOSIS — Z8669 Personal history of other diseases of the nervous system and sense organs: Secondary | ICD-10-CM | POA: Diagnosis not present

## 2024-01-07 DIAGNOSIS — G894 Chronic pain syndrome: Secondary | ICD-10-CM | POA: Diagnosis not present

## 2024-01-07 MED ORDER — TAPENTADOL HCL 50 MG PO TABS: 50.0000 mg | ORAL_TABLET | Freq: Three times a day (TID) | ORAL | 0 refills | Status: DC

## 2024-01-11 ENCOUNTER — Telehealth: Payer: Self-pay | Admitting: Family Medicine

## 2024-01-11 MED ORDER — TAPENTADOL HCL 50 MG PO TABS
50.0000 mg | ORAL_TABLET | Freq: Three times a day (TID) | ORAL | 0 refills | Status: AC
Start: 2024-01-11 — End: ?

## 2024-01-11 NOTE — Telephone Encounter (Signed)
 Spouse called back and spoke w/ phone room. Reported  [facility] did not have enough to fill, they only gave him 10 days worth, ran out on the 27th. Was told a new prescription would be needed. Aware we will send to MD to send in remaining.

## 2024-01-11 NOTE — Telephone Encounter (Signed)
 Pt is asking this   tapentadol  (NUCYNTA ) 50 MG tablet  be called into the [facility] [phone] - again

## 2024-01-11 NOTE — Telephone Encounter (Addendum)
 Pt last refilled 12/26/23 #30. Too soon to send in refill. 28 days after this would be 01/23/24.  I LVM for pt to call back.

## 2024-01-11 NOTE — Addendum Note (Signed)
 Addended by: Oneita Bihari on: 01/11/2024 10:45 AM   Modules accepted: Orders

## 2024-01-21 ENCOUNTER — Other Ambulatory Visit: Payer: Self-pay | Admitting: "Endocrinology

## 2024-01-21 DIAGNOSIS — E1165 Type 2 diabetes mellitus with hyperglycemia: Secondary | ICD-10-CM

## 2024-01-21 DIAGNOSIS — E0840 Diabetes mellitus due to underlying condition with diabetic neuropathy, unspecified: Secondary | ICD-10-CM

## 2024-01-21 NOTE — Telephone Encounter (Signed)
 Requested Prescriptions   Pending Prescriptions Disp Refills   glipiZIDE  (GLUCOTROL  XL) 10 MG 24 hr tablet [Pharmacy Med Name: glipiZIDE  ER Oral Tablet Extended Release 24 Hour 10 MG] 90 tablet 3    Sig: TAKE 1 TABLET EVERY DAY WITH BREAKFAST   FARXIGA  10 MG TABS tablet [Pharmacy Med Name: Farxiga  Oral Tablet 10 MG] 90 tablet 3    Sig: TAKE 1 TABLET EVERY DAY BEFORE BREAKFAST   TRESIBA  FLEXTOUCH 200 UNIT/ML FlexTouch Pen [Pharmacy Med Name: Tresiba  FlexTouch Subcutaneous Solution Pen-injector 200 UNIT/ML] 36 mL 3    Sig: INJECT 74 UNITS UNDER THE SKIN EVERY DAY

## 2024-01-25 ENCOUNTER — Ambulatory Visit: Admitting: "Endocrinology

## 2024-01-25 ENCOUNTER — Encounter: Payer: Self-pay | Admitting: "Endocrinology

## 2024-01-25 VITALS — BP 140/80 | HR 85 | Ht 69.0 in | Wt 213.0 lb

## 2024-01-25 DIAGNOSIS — Z794 Long term (current) use of insulin: Secondary | ICD-10-CM

## 2024-01-25 DIAGNOSIS — Z7984 Long term (current) use of oral hypoglycemic drugs: Secondary | ICD-10-CM | POA: Diagnosis not present

## 2024-01-25 DIAGNOSIS — R310 Gross hematuria: Secondary | ICD-10-CM | POA: Diagnosis not present

## 2024-01-25 DIAGNOSIS — E11649 Type 2 diabetes mellitus with hypoglycemia without coma: Secondary | ICD-10-CM

## 2024-01-25 DIAGNOSIS — Z7985 Long-term (current) use of injectable non-insulin antidiabetic drugs: Secondary | ICD-10-CM

## 2024-01-25 DIAGNOSIS — E782 Mixed hyperlipidemia: Secondary | ICD-10-CM | POA: Diagnosis not present

## 2024-01-25 LAB — POCT GLYCOSYLATED HEMOGLOBIN (HGB A1C): Hemoglobin A1C: 5.2 % (ref 4.0–5.6)

## 2024-01-25 NOTE — Progress Notes (Signed)
 Outpatient Endocrinology Note Frank Newcomer, MD  01/25/24   Frank [name] 70/15/1957 [id number]  Referring Provider: Roslyn Coombe, MD Primary Care Provider: Roslyn Coombe, MD Reason for consultation: Subjective   Assessment & Plan  Diagnoses and all orders for this visit:  Uncontrolled type 2 diabetes mellitus with hypoglycemia without coma (HCC) -     POCT glycosylated hemoglobin (Hb A1C)  Long term (current) use of oral hypoglycemic drugs  Long-term (current) use of injectable non-insulin  antidiabetic drugs  Long-term insulin  use (HCC)  Mixed hypercholesterolemia and hypertriglyceridemia   Diabetes Type 2 complicated by neuropathy Lab Results  Component Value Date   GFR 65.36 09/07/2023   Hba1c goal less than 7, current Hba1c is  Lab Results  Component Value Date   HGBA1C 5.2 01/25/2024   Will recommend the following: Farxiga  10 mg daily Glipizide  XL 10 mg once every morning Mounjaro  7.5mg ->10 mg weekly after finishing all the boxes of 7.5mg   Tresiba  48 units daily (decrease by 2 units if BG <80, and increase by 2 units if BG>150 consistently) Not interested in mealtime insulin   Seen vascular 07/2023: normal pulses/ABI Discussed about lifestyle changes  Neuropathy managed by neurology: Discussed with PCP and neurology to reassess his medications to improve patient's mentation and responsiveness patient's symptoms very blank.  Instructed partner to check on the dosages of medications that the patient is taking to confirm accuracy.  Side effects from medications: Diarrhea from metformin  ER Stopped Ozempic  2 mg weekly to help with weight loss  No known contraindications to any of above medications  -Last LD and Tg are as follows: Lab Results  Component Value Date   LDLCALC 44 09/07/2023    Lab Results  Component Value Date   TRIG 162.0 (H) 09/07/2023   -Recommend rosuvastatin  20 mg QD -Follow low fat diet and exercise   -Blood pressure  goal <140/90 - Microalbumin/creatinine goal < 30 -Last MA/Cr is as follows: Lab Results  Component Value Date   MICROALBUR 1.7 09/07/2023   -not on ACE/ARB  -diet changes including salt restriction -limit eating outside -counseled BP targets per standards of diabetes care -uncontrolled blood pressure can lead to retinopathy, nephropathy and cardiovascular and atherosclerotic heart disease  Reviewed and counseled on: -A1C target -Blood sugar targets -Complications of uncontrolled diabetes  -Checking blood sugar before meals and bedtime and bring log next visit -All medications with mechanism of action and side effects -Hypoglycemia management: rule of 15's, Glucagon Emergency Kit and medical alert ID -low-carb low-fat plate-method diet -At least 20 minutes of physical activity per day -Annual dilated retinal eye exam and foot exam -compliance and follow up needs -follow up as scheduled or earlier if problem gets worse  Call if blood sugar is less than 70 or consistently above 250    Take a 15 gm snack of carbohydrate at bedtime before you go to sleep if your blood sugar is less than 100.    If you are going to fast after midnight for a test or procedure, ask your physician for instructions on how to reduce/decrease your insulin  dose.    Call if blood sugar is less than 70 or consistently above 250  -Treating a low sugar by rule of 15  (15 gms of sugar every 15 min until sugar is more than 70) If you feel your sugar is low, test your sugar to be sure If your sugar is low (less than 70), then take 15 grams of a fast acting Carbohydrate (  3-4 glucose tablets or glucose gel or 4 ounces of juice or regular soda) Recheck your sugar 15 min after treating low to make sure it is more than 70 If sugar is still less than 70, treat again with 15 grams of carbohydrate          Don't drive the hour of hypoglycemia  If unconscious/unable to eat or drink by mouth, use glucagon injection or  nasal spray baqsimi and call 911. Can repeat again in 15 min if still unconscious.  Return in about 2 months (around 03/26/2024).   I have reviewed current medications, nurse's notes, allergies, vital signs, past medical and surgical history, family medical history, and social history for this encounter. Counseled patient on symptoms, examination findings, lab findings, imaging results, treatment decisions and monitoring and prognosis. The patient understood the recommendations and agrees with the treatment plan. All questions regarding treatment plan were fully answered.  Frank Newcomer, MD  01/25/24    History of Present Illness Frank [name] is a 68 y.o. year old male who presents for follow up on Type 2 diabetes mellitus.  Frank [name] was first diagnosed in 2012.   Diabetes education +  Home diabetes regimen: Non-insulin  hypoglycemic drugs: Farxiga  10 mg daily, mounjaro  7.5mg  weekly, glipizide  XL 10 mg daily    Insulin  regimen: Tresiba  60 units daily          Previously on Ozempic  2 mg weekly Side effects from medications: Diarrhea from metformin  ER  Previous history:  His A1c has ranged between 7 and 10.8 since 2018 Farxiga  was started in 2022, was on Ozempic  since 7/22, previously he took Trulicity  for a year  COMPLICATIONS -  MI/Stroke -  retinopathy +  neuropathy -  nephropathy  BLOOD SUGAR DATA CGM interpretation: At today's visit, we reviewed her CGM downloads. The full report is scanned in the media. Reviewing the CGM trends, BG are well controlled throughout the day with some lows in the evening.    Physical Exam  BP (!) 140/80   Pulse 85   Ht 5' 9 (1.753 m)   Wt 213 lb (96.6 kg)   SpO2 98%   BMI 31.45 kg/m    Constitutional: well developed, well nourished Head: normocephalic, atraumatic Eyes: sclera anicteric, no redness Neck: supple Lungs: normal respiratory effort Neurology: alert and oriented Skin: dry, no appreciable  rashes Musculoskeletal: no appreciable defects Psychiatric: normal mood and affect Diabetic Foot Exam - Simple   No data filed      Current Medications Patient's Medications  New Prescriptions   No medications on file  Previous Medications   ACCU-CHEK SOFTCLIX LANCETS LANCETS    Use to check blood sugars daily   ARIPIPRAZOLE  (ABILIFY ) 10 MG TABLET    TAKE 1 TABLET EVERY DAY   CONTINUOUS BLOOD GLUC SENSOR (DEXCOM G7 SENSOR) MISC    Change every 10 days   CYCLOBENZAPRINE  (FLEXERIL ) 5 MG TABLET    TAKE 1 TABLET AT BEDTIME   DROPLET PEN NEEDLES 31G X 8 MM MISC    USE TO INJECT TRESIBA  UNDER THE SKIN EVERY DAY AS DIRECTED   DULOXETINE  (CYMBALTA ) 60 MG CAPSULE    TAKE 1 CAPSULE TWICE DAILY   FARXIGA  10 MG TABS TABLET    TAKE 1 TABLET EVERY DAY BEFORE BREAKFAST   GLIPIZIDE  (GLUCOTROL  XL) 10 MG 24 HR TABLET    TAKE 1 TABLET EVERY DAY WITH BREAKFAST   GLUCOSE BLOOD (ACCU-CHEK GUIDE) TEST STRIP    Use to check blood  sugars daily   HYDROCHLOROTHIAZIDE  (MICROZIDE ) 12.5 MG CAPSULE    TAKE 1 CAPSULE BY MOUTH EVERY DAY   LORAZEPAM  (ATIVAN ) 1 MG TABLET    TAKE 1 TABLET BY MOUTH TWICE A DAY AS NEEDED   METHYLPHENIDATE  (CONCERTA ) 36 MG PO CR TABLET    Take 1 tablet (36 mg total) by mouth daily.   OMEPRAZOLE (PRILOSEC OTC) 20 MG TABLET    Take 20 mg by mouth daily.   PREGABALIN  (LYRICA ) 200 MG CAPSULE    TAKE 1 CAPSULE TWICE DAILY   ROSUVASTATIN  (CRESTOR ) 20 MG TABLET    TAKE 1 TABLET BY MOUTH EVERY DAY   SILDENAFIL  (VIAGRA ) 100 MG TABLET    Take 0.5-1 tablets (50-100 mg total) by mouth daily as needed for erectile dysfunction.   TAPENTADOL  (NUCYNTA ) 50 MG TABLET    Take 1 tablet (50 mg total) by mouth 3 (three) times daily.   TAPENTADOL  (NUCYNTA ) 50 MG TABLET    Take 1 tablet (50 mg total) by mouth 3 (three) times daily.   TIRZEPATIDE  (MOUNJARO ) 10 MG/0.5ML PEN    INJECT 10 MG INTO THE SKIN ONE TIME PER WEEK   TRESIBA  FLEXTOUCH 200 UNIT/ML FLEXTOUCH PEN    INJECT 74 UNITS UNDER THE SKIN EVERY DAY   Modified Medications   No medications on file  Discontinued Medications   No medications on file    Allergies Allergies  Allergen Reactions   Codeine Itching    REACTION: Itching   Influenza Vaccines     Due to hx of GBS   Metformin  And Related Other (See Comments)    GI upset, diarrhea   Other Other (See Comments)    Due to hx of GBS- vaccines    Shellfish Allergy     Past Medical History Past Medical History:  Diagnosis Date   Abdominal pain, left lower quadrant 06/06/2010   Abdominal pain, unspecified site 01/19/2009   ADD 10/09/2008   ALLERGIC RHINITIS 10/09/2008   Allergy    ANXIETY DEPRESSION 02/01/2008   Arthritis    hands ?   Blood transfusion without reported diagnosis    Cataract    removed both eyes    COLONIC POLYPS 02/01/2008   DIABETES MELLITUS, TYPE II 05/20/2010   DYSPNEA 03/12/2010   ERECTILE DYSFUNCTION 10/09/2008   ERECTILE DYSFUNCTION, ORGANIC 05/20/2010   FOOT PAIN, LEFT 05/20/2010   GERD 02/01/2008   Guillain Barr syndrome (HCC)    HEMORRHOIDS 02/01/2008   HIATAL HERNIA 02/01/2008   HYPERLIPIDEMIA 10/09/2008   HYPERTENSION 10/09/2008   MORTON'S NEUROMA 05/20/2010   Other specified forms of hearing loss 06/27/2009   PERIPHERAL EDEMA 05/20/2010   PERIPHERAL NEUROPATHY 05/20/2010   Sleep apnea    stopped cpap    SLEEP APNEA, OBSTRUCTIVE 02/01/2008   Stricture and stenosis of esophagus 02/02/2008   Type II or unspecified type diabetes mellitus without mention of complication, uncontrolled 11/14/2010   WRIST PAIN, LEFT 12/05/2009    Past Surgical History Past Surgical History:  Procedure Laterality Date   CARPAL TUNNEL RELEASE Bilateral    COLONOSCOPY     DENTAL SURGERY     ESOPHAGEAL DILATION  july 2009   ESOPHAGOGASTRODUODENOSCOPY N/A 06/27/2014   Procedure: ESOPHAGOGASTRODUODENOSCOPY (EGD);  Surgeon: Pietro Bridegroom, MD;  Location: [facility];  Service: Endoscopy;  Laterality: N/A;   EYE SURGERY     catract surgery on both eyes   POLYPECTOMY      ROTATOR CUFF REPAIR     UPPER GASTROINTESTINAL ENDOSCOPY  Family History family history includes Depression in his mother; Diabetes in his brother and mother; Heart disease in his mother; Hyperlipidemia in his mother.  Social History Social History   Socioeconomic History   Marital status: Married    Spouse name: Not on file   Number of children: Not on file   Years of education: Not on file   Highest education level: Not on file  Occupational History   Occupation: Housekeeper [employer]    Employer: [employer]  Tobacco Use   Smoking status: Never   Smokeless tobacco: Never  Vaping Use   Vaping status: Never Used  Substance and Sexual Activity   Alcohol use: Yes    Comment: occasional   Drug use: Yes    Types: Methaqualone   Sexual activity: Yes    Comment: before I got sick  Other Topics Concern   Not on file  Social History Narrative   Not on file   Social Drivers of Health   Financial Resource Strain: Not on file  Food Insecurity: Not on file  Transportation Needs: Not on file  Physical Activity: Not on file  Stress: Not on file  Social Connections: Not on file  Intimate Partner Violence: Not on file    Lab Results  Component Value Date   HGBA1C 5.2 01/25/2024   HGBA1C 8.4 (A) 09/03/2023   HGBA1C 8.2 (A) 06/03/2023   Lab Results  Component Value Date   CHOL 121 09/07/2023   Lab Results  Component Value Date   HDL 44.60 09/07/2023   Lab Results  Component Value Date   LDLCALC 44 09/07/2023   Lab Results  Component Value Date   TRIG 162.0 (H) 09/07/2023   Lab Results  Component Value Date   CHOLHDL 3 09/07/2023   Lab Results  Component Value Date   CREATININE 1.02 12/04/2023   Lab Results  Component Value Date   GFR 65.36 09/07/2023   Lab Results  Component Value Date   MICROALBUR 1.7 09/07/2023      Component Value Date/Time   NA 137 12/04/2023 1944   K 3.6 12/04/2023 1944   CL 101 12/04/2023 1944   CO2 27 12/04/2023 1944    GLUCOSE 83 12/04/2023 1944   BUN 18 12/04/2023 1944   CREATININE 1.02 12/04/2023 1944   CALCIUM  9.1 12/04/2023 1944   PROT 7.1 09/07/2023 1210   PROT 6.9 06/10/2023 1028   ALBUMIN  4.4 09/07/2023 1210   AST 21 09/07/2023 1210   ALT 17 09/07/2023 1210   ALKPHOS 81 09/07/2023 1210   BILITOT 0.7 09/07/2023 1210   GFRNONAA >60 12/04/2023 1944   GFRAA >90 08/06/2014 0240      Latest Ref Rng & Units 12/04/2023    7:44 PM 09/07/2023   12:10 PM 11/02/2022    6:56 PM  BMP  Glucose 70 - 99 mg/dL 83  78  161   BUN 8 - 23 mg/dL 18  14  16    Creatinine 0.61 - 1.24 mg/dL 0.96  0.45  4.09   Sodium 135 - 145 mmol/L 137  140  136   Potassium 3.5 - 5.1 mmol/L 3.6  4.2  3.8   Chloride 98 - 111 mmol/L 101  101  99   CO2 22 - 32 mmol/L 27  30  26    Calcium  8.9 - 10.3 mg/dL 9.1  9.3  9.4        Component Value Date/Time   WBC 7.9 12/04/2023 1944   RBC 4.98 12/04/2023 1944  HGB 15.2 12/04/2023 1944   HCT 44.6 12/04/2023 1944   PLT 147 (L) 12/04/2023 1944   MCV 89.6 12/04/2023 1944   MCV 93.0 12/30/2013 1537   MCH 30.5 12/04/2023 1944   MCHC 34.1 12/04/2023 1944   RDW 13.2 12/04/2023 1944   LYMPHSABS 2.4 12/04/2023 1944   MONOABS 0.6 12/04/2023 1944   EOSABS 0.1 12/04/2023 1944   BASOSABS 0.0 12/04/2023 1944     Parts of this note may have been dictated using voice recognition software. There may be variances in spelling and vocabulary which are unintentional. Not all errors are proofread. Please notify the [facility] if any discrepancies are noted or if the meaning of any statement is not clear.

## 2024-01-25 NOTE — Patient Instructions (Addendum)
 Will recommend the following: Farxiga  10 mg daily Glipizide  XL 10 mg once every morning Mounjaro  7.5 mg/week->10 mg weekly Tresiba  48 units daily

## 2024-01-26 ENCOUNTER — Other Ambulatory Visit: Payer: Self-pay | Admitting: Neurology

## 2024-01-26 DIAGNOSIS — E1142 Type 2 diabetes mellitus with diabetic polyneuropathy: Secondary | ICD-10-CM

## 2024-01-26 MED ORDER — TAPENTADOL HCL 50 MG PO TABS: 50.0000 mg | ORAL_TABLET | Freq: Three times a day (TID) | ORAL | 0 refills | Status: DC

## 2024-01-26 NOTE — Telephone Encounter (Signed)
 Last seen on 01/04/24 Follow up scheduled on 05/18/24   Dispensed Days Supply Quantity Provider Pharmacy  Nucynta  50 mg tablet 01/12/2024 20 60 tablet Sater, Sherida Dimmer, MD [facility]...    Rx pending to be signed ( pt only received a 20 day supply which is due for refill this weekend) I

## 2024-01-26 NOTE — Telephone Encounter (Signed)
 Pt call to request medication  refill tapentadol  (NUCYNTA ) 50 MG tablet   Pt wouldlike medication sent to  [facility] [id number] - [place], [place] - [address] (Ph: [phone])

## 2024-02-02 ENCOUNTER — Telehealth: Payer: Self-pay | Admitting: Family Medicine

## 2024-02-02 NOTE — Telephone Encounter (Signed)
 I have discussed concerns presented in note from Dr Dartha, endo, with Dr Vear. Chronic pain patient. We are continuing to assess. Patient with complex chronic pain history and mood disorder. No drug seeking behavior. Drug screen appropriate 05/2023. Will continue to assess/wean as appropriate.

## 2024-02-02 NOTE — Telephone Encounter (Signed)
 Error

## 2024-02-06 ENCOUNTER — Other Ambulatory Visit: Payer: Self-pay | Admitting: Internal Medicine

## 2024-02-08 ENCOUNTER — Other Ambulatory Visit: Payer: Self-pay

## 2024-02-15 ENCOUNTER — Other Ambulatory Visit: Payer: Self-pay | Admitting: *Deleted

## 2024-02-15 NOTE — Telephone Encounter (Signed)
 Last seen on 01/07/24 Follow up scheduled on 05/18/24  Dispensed Days Supply Quantity Provider Pharmacy  pregabalin  200 mg capsule 11/10/2023 90 180 capsule Buck Saucer, MD [facility]...     Rx pending to be signed

## 2024-02-16 MED ORDER — PREGABALIN 200 MG PO CAPS: 200.0000 mg | ORAL_CAPSULE | Freq: Two times a day (BID) | ORAL | 1 refills | Status: DC

## 2024-02-24 ENCOUNTER — Other Ambulatory Visit: Payer: Self-pay | Admitting: Internal Medicine

## 2024-02-29 ENCOUNTER — Other Ambulatory Visit: Payer: Self-pay | Admitting: Family Medicine

## 2024-02-29 DIAGNOSIS — E1142 Type 2 diabetes mellitus with diabetic polyneuropathy: Secondary | ICD-10-CM

## 2024-02-29 MED ORDER — TAPENTADOL HCL 50 MG PO TABS: 50.0000 mg | ORAL_TABLET | Freq: Three times a day (TID) | ORAL | 0 refills | Status: DC

## 2024-02-29 NOTE — Telephone Encounter (Signed)
 Last seen 01/07/24 and next f/u 05/18/24. Last refilled 01/31/24 #90.

## 2024-02-29 NOTE — Telephone Encounter (Signed)
 Pt called needing a refill on his tapentadol  (NUCYNTA ) 50 MG tablet and is needing it sent to the [facility] in [facility].

## 2024-03-09 DIAGNOSIS — R310 Gross hematuria: Secondary | ICD-10-CM | POA: Diagnosis not present

## 2024-03-16 DIAGNOSIS — E1140 Type 2 diabetes mellitus with diabetic neuropathy, unspecified: Secondary | ICD-10-CM | POA: Diagnosis not present

## 2024-03-28 ENCOUNTER — Other Ambulatory Visit: Payer: Self-pay | Admitting: Family Medicine

## 2024-03-28 DIAGNOSIS — E1142 Type 2 diabetes mellitus with diabetic polyneuropathy: Secondary | ICD-10-CM

## 2024-03-28 NOTE — Telephone Encounter (Signed)
 Pt is requesting a refill for tapentadol  (NUCYNTA ) 50 MG tablet .  Pharmacy: [facility] [id number]

## 2024-03-28 NOTE — Telephone Encounter (Signed)
 Last seen on 01/07/24 Follow up scheduled on 06/09/24   Dispensed Days Supply Quantity Provider Pharmacy  Nucynta  50 mg tablet 03/01/2024 30 90 tablet Sater, Charlie LABOR, MD [facility]...   Rx pending to be signed

## 2024-03-29 ENCOUNTER — Ambulatory Visit: Admitting: "Endocrinology

## 2024-03-29 MED ORDER — TAPENTADOL HCL 50 MG PO TABS: 50.0000 mg | ORAL_TABLET | Freq: Three times a day (TID) | ORAL | 0 refills | Status: DC

## 2024-03-30 ENCOUNTER — Ambulatory Visit

## 2024-04-01 ENCOUNTER — Other Ambulatory Visit: Payer: Self-pay | Admitting: Internal Medicine

## 2024-05-02 ENCOUNTER — Other Ambulatory Visit: Payer: Self-pay | Admitting: Neurology

## 2024-05-02 DIAGNOSIS — E1142 Type 2 diabetes mellitus with diabetic polyneuropathy: Secondary | ICD-10-CM

## 2024-05-02 MED ORDER — TAPENTADOL HCL 50 MG PO TABS: 50.0000 mg | ORAL_TABLET | Freq: Three times a day (TID) | ORAL | 0 refills | Status: DC

## 2024-05-02 NOTE — Telephone Encounter (Signed)
 Pt called to request Medication refill  tapentadol  (NUCYNTA ) 50 MG tablet   Pt would like medication sent to  [facility] [id number] - [place], [place] - [address] (Ph: [phone])

## 2024-05-02 NOTE — Telephone Encounter (Signed)
 Dr.Dohmeier you are work in provider this morning Last seen on 01/07/24 Follow up scheduled on 05/18/24   Dispensed Days Supply Quantity Provider Pharmacy  Nucynta  50 mg tablet 03/30/2024 30 90 tablet Sater, Charlie LABOR, MD [facility]...     Rx pending to be signed

## 2024-05-12 ENCOUNTER — Ambulatory Visit

## 2024-05-18 ENCOUNTER — Encounter: Payer: Self-pay | Admitting: Neurology

## 2024-05-18 ENCOUNTER — Ambulatory Visit: Admitting: Neurology

## 2024-05-18 VITALS — BP 120/72 | HR 94 | Ht 69.0 in | Wt 214.0 lb

## 2024-05-18 DIAGNOSIS — R208 Other disturbances of skin sensation: Secondary | ICD-10-CM

## 2024-05-18 DIAGNOSIS — E1142 Type 2 diabetes mellitus with diabetic polyneuropathy: Secondary | ICD-10-CM

## 2024-05-18 DIAGNOSIS — Z7984 Long term (current) use of oral hypoglycemic drugs: Secondary | ICD-10-CM | POA: Diagnosis not present

## 2024-05-18 DIAGNOSIS — G4733 Obstructive sleep apnea (adult) (pediatric): Secondary | ICD-10-CM | POA: Diagnosis not present

## 2024-05-18 DIAGNOSIS — G894 Chronic pain syndrome: Secondary | ICD-10-CM

## 2024-05-18 MED ORDER — TAPENTADOL HCL 50 MG PO TABS: 50.0000 mg | ORAL_TABLET | Freq: Three times a day (TID) | ORAL | 0 refills | Status: DC

## 2024-05-18 NOTE — Progress Notes (Signed)
 [facility]  PATIENT: Frank [name] DOB: 07-17-1956  _________________________________   HISTORICAL  CHIEF COMPLAINT:  Chief Complaint  Patient presents with   Follow-up    Pt in room 11. Alone. Here for Polyneuropathy follow. Not using cpap.     HISTORY OF PRESENT ILLNESS:  Frank [name] is a 68 year old man with a chronic peripheral neuropathy who had superimposed Guillain Barr syndrome in November 2015 and has had severe neuropathic pain  Update 05/18/2024: He notes more pain on the top of his feet.  He tried an OTC lidocaine /menthol  roll on without much benefit. He has pain up to the knees in the legs and also in fingers.  Pain quality is burning. Pain is mostly from diabetic polyneuropathy.  He also has carpal tunnel syndrome.  He also feels his muscles are stiff, .     Since last visit 09/08/2023 NCV/EMG: Nerve studies recording from both the FDI and ADM on the left showed reduced amplitude, delayed distal onset latency, conduction velocity drop across the elbow.  In addition EMG showed acute/ongoing denervation and chronic neurogenic changes in the left FDI with decreased insertional in the left FDP ulnar head consistent with severe ulnar neuropathy across the left elbow (not seen on the right) There is concomitant electrophysiologic evidence of moderately severe, sensorimotor, length dependent, axonal, widespread polyneuropathy with widespread chronic neurogenic changes possibly due to his past history including severe Guillain-Barr syndrome and diabetic polyneuropathy.  No distal acute/ongoing denervation seen was thought to be related to his polyneuropathy. There is no conduction block across the carpal tunnel when recording a distal median motor muscle in the palm and then more proximally across the carpal tunnel suggesting that there is no median neuropathy across the wrists.     He is on Nucynta  50 mg tid, Lyrica  200 mg po bid and Cymbalta  60 mg po  bid.  Nucynta  sometimes causes mild sleepiness.  Due to him having untreated OSA I also reduced  from 75 to 50 mg in 2023.   His PCP prescribes lorazepam  0.5 mg bid.   He is on methylphenidate  ER 36 qAM   The NCCS database shows good compliance.       He has a lot of numbness in his feet.   No sores.    No falls.   He needs to hold something if eyes are closed  He is sleeping well.   He has OSA (severe 69.3) but was unable to tolerate CPAP (tried one mask but feels he would not tolerate with other masks). He has gained 10 pounds over the past yeart.   He wakes up a couple times a night due to nocturia but quickly falls back asleep..  We discussed he has severe OSA but did not tolerate CPAP and is not interested in surgery or oral appliance.   He did not lose any weight on Ozempic  but will be trying a different medication (Mounjoro??)    EPWORTH SLEEPINESS SCALE 06/10/2023  On a scale of 0 - 3 what is the chance of dozing:  Sitting and Reading:   1 Watching TV:    2 Sitting inactive in a public place: 2 Passenger in car for one hour: 2 Lying down to rest in the afternoon: 3 Sitting and talking to someone: 0 Sitting quietly after lunch:  3 In a car, stopped in traffic:  0    Total (out of 24):   13/24 mild ESS   B12 was low 2021 but he never  supplemented.     GBS History:   The initial episode of GBS was treated with plasmapheresis. He reports that the diagnosis took a couple weeks and he had several emergency room visits. Initially he was sent to a skilled nursing facility. He was reporting a lot of back pain alongside the weakness. A lumbar puncture was eventually done showing high protein which led to the dye diagnosis of GBS.  He had severe weakness at the peak and required intubation for respiratory support. At that time, he was unable to move his legs and could barely move his arms. He improved quite a bit after plasmapheresis and was discharged. However, a couple days after discharge  he began to feel weak again and return to the emergency room. He was recently admitted for recurrent GBS. For that hospital stay he did not require intubation but did have a facemask oxygen. He also was treated with IVIG the second time around. He improved and was discharged home. He did outpatient physical therapy. He has had a fairly good in movement of his strength and is able to walk independently. However, he has had more dysesthetic pain.   With the exception of more pain, he otherwise feels very close to his pre-GBS baseline.   Of note, he had a flu shot a few weeks before the first episode of GBS.  MRI's from 06/2014 and 07/2014:    Cervical MRI shows spinal stenosis at C4-C5, C5-C6 and C6-C7. There is no spinal cord compression. Thoracic spine shows T8-T9 disc extrusion to the left that does not cause spinal cord compression. There are mild degenerative changes in the lumbar spine. Contrast was not used.   I also reviewed many of the notes and labs from his hospital stay. CSF Protein was greatly elevated at 118 and the CSF white blood cell count was 1.    REVIEW OF SYSTEMS: Constitutional: No fevers, chills, sweats, or change in appetite.   Has sleepiness Eyes: No visual changes, double vision, eye pain Ear, nose and throat: No hearing loss, ear pain, nasal congestion, sore throat Cardiovascular: No chest pain, palpitations Respiratory:  No shortness of breath at rest or with exertion.   No wheezes.  He has OSA GastrointestinaI: No nausea, vomiting, diarrhea, abdominal pain, fecal incontinence Genitourinary:  No dysuria, urinary retention or frequency.  No nocturia. Musculoskeletal:  No neck pain, back pain Integumentary: No rash, pruritus, skin lesions Neurological: as above Psychiatric: Anxiety and depression at this time.   ADD is stable. Endocrine: No palpitations, diaphoresis, change in appetite, change in weigh or increased thirst Hematologic/Lymphatic:  No anemia, purpura,  petechiae. Allergic/Immunologic: No itchy/runny eyes, nasal congestion, recent allergic reactions, rashes  ALLERGIES: Allergies  Allergen Reactions   Codeine Itching    REACTION: Itching   Influenza Vaccines     Due to hx of GBS   Metformin  And Related Other (See Comments)    GI upset, diarrhea   Other Other (See Comments)    Due to hx of GBS- vaccines    Shellfish Allergy     HOME MEDICATIONS:  Current Outpatient Medications:    Accu-Chek Softclix Lancets lancets, Use to check blood sugars daily, Disp: 100 each, Rfl: 11   ARIPiprazole  (ABILIFY ) 10 MG tablet, TAKE 1 TABLET EVERY DAY, Disp: 90 tablet, Rfl: 3   Continuous Blood Gluc Sensor (DEXCOM G7 SENSOR) MISC, Change every 10 days, Disp: 3 each, Rfl: 2   cyclobenzaprine  (FLEXERIL ) 5 MG tablet, TAKE 1 TABLET AT BEDTIME, Disp: 90 tablet,  Rfl: 3   DROPLET PEN NEEDLES 31G X 8 MM MISC, USE TO INJECT TRESIBA  UNDER THE SKIN EVERY DAY AS DIRECTED, Disp: 100 each, Rfl: 3   DULoxetine  (CYMBALTA ) 60 MG capsule, TAKE 1 CAPSULE TWICE DAILY, Disp: 180 capsule, Rfl: 3   FARXIGA  10 MG TABS tablet, TAKE 1 TABLET EVERY DAY BEFORE BREAKFAST, Disp: 90 tablet, Rfl: 3   glipiZIDE  (GLUCOTROL  XL) 10 MG 24 hr tablet, TAKE 1 TABLET EVERY DAY WITH BREAKFAST, Disp: 90 tablet, Rfl: 3   glucose blood (ACCU-CHEK GUIDE) test strip, Use to check blood sugars daily, Disp: 100 strip, Rfl: 11   hydrochlorothiazide  (MICROZIDE ) 12.5 MG capsule, TAKE 1 CAPSULE BY MOUTH EVERY DAY, Disp: 90 capsule, Rfl: 1   LORazepam  (ATIVAN ) 1 MG tablet, TAKE 1 TABLET BY MOUTH TWICE A DAY AS NEEDED, Disp: 60 tablet, Rfl: 5   methylphenidate  (CONCERTA ) 36 MG PO CR tablet, Take 1 tablet (36 mg total) by mouth daily., Disp: 90 tablet, Rfl: 0   omeprazole (PRILOSEC OTC) 20 MG tablet, Take 20 mg by mouth daily., Disp: , Rfl:    pregabalin  (LYRICA ) 200 MG capsule, Take 1 capsule (200 mg total) by mouth 2 (two) times daily., Disp: 180 capsule, Rfl: 1   rosuvastatin  (CRESTOR ) 20 MG tablet,  TAKE 1 TABLET BY MOUTH EVERY DAY, Disp: 90 tablet, Rfl: 1   sildenafil  (VIAGRA ) 100 MG tablet, Take 0.5-1 tablets (50-100 mg total) by mouth daily as needed for erectile dysfunction., Disp: 5 tablet, Rfl: 11   tirzepatide  (MOUNJARO ) 10 MG/0.5ML Pen, INJECT 10 MG INTO THE SKIN ONE TIME PER WEEK, Disp: 6 mL, Rfl: 0   TRESIBA  FLEXTOUCH 200 UNIT/ML FlexTouch Pen, INJECT 74 UNITS UNDER THE SKIN EVERY DAY, Disp: 36 mL, Rfl: 3   tapentadol  (NUCYNTA ) 50 MG tablet, Take 1 tablet (50 mg total) by mouth 3 (three) times daily., Disp: 90 tablet, Rfl: 0  Current Facility-Administered Medications:    dextrose  5 % solution, , Intravenous, Once, Danis, Victory CROME III, MD   dextrose  50 % solution 25 mL, 25 mL, Intravenous, Once, Danis, Victory CROME MOULD, MD  PAST MEDICAL HISTORY: Past Medical History:  Diagnosis Date   Abdominal pain, left lower quadrant 06/06/2010   Abdominal pain, unspecified site 01/19/2009   ADD 10/09/2008   ALLERGIC RHINITIS 10/09/2008   Allergy    ANXIETY DEPRESSION 02/01/2008   Arthritis    hands ?   Blood transfusion without reported diagnosis    Cataract    removed both eyes    COLONIC POLYPS 02/01/2008   DIABETES MELLITUS, TYPE II 05/20/2010   DYSPNEA 03/12/2010   ERECTILE DYSFUNCTION 10/09/2008   ERECTILE DYSFUNCTION, ORGANIC 05/20/2010   FOOT PAIN, LEFT 05/20/2010   GERD 02/01/2008   Guillain Barr syndrome    HEMORRHOIDS 02/01/2008   HIATAL HERNIA 02/01/2008   HYPERLIPIDEMIA 10/09/2008   HYPERTENSION 10/09/2008   MORTON'S NEUROMA 05/20/2010   Other specified forms of hearing loss 06/27/2009   PERIPHERAL EDEMA 05/20/2010   PERIPHERAL NEUROPATHY 05/20/2010   Sleep apnea    stopped cpap    SLEEP APNEA, OBSTRUCTIVE 02/01/2008   Stricture and stenosis of esophagus 02/02/2008   Type II or unspecified type diabetes mellitus without mention of complication, uncontrolled 11/14/2010   WRIST PAIN, LEFT 12/05/2009    PAST SURGICAL HISTORY: Past Surgical History:  Procedure Laterality Date   CARPAL  TUNNEL RELEASE Bilateral    COLONOSCOPY     DENTAL SURGERY     ESOPHAGEAL DILATION  july 2009   ESOPHAGOGASTRODUODENOSCOPY  N/A 06/27/2014   Procedure: ESOPHAGOGASTRODUODENOSCOPY (EGD);  Surgeon: Princella CHRISTELLA Nida, MD;  Location: [facility];  Service: Endoscopy;  Laterality: N/A;   EYE SURGERY     catract surgery on both eyes   POLYPECTOMY     ROTATOR CUFF REPAIR     UPPER GASTROINTESTINAL ENDOSCOPY      FAMILY HISTORY: Family History  Problem Relation Age of Onset   Diabetes Mother    Heart disease Mother    Hyperlipidemia Mother    Depression Mother    Diabetes Brother    Colon cancer Neg Hx    Colon polyps Neg Hx    Esophageal cancer Neg Hx    Rectal cancer Neg Hx    Stomach cancer Neg Hx     SOCIAL HISTORY:  Social History   Socioeconomic History   Marital status: Married    Spouse name: Not on file   Number of children: Not on file   Years of education: Not on file   Highest education level: Not on file  Occupational History   Occupation: Housekeeper [employer]    Employer: [employer]  Tobacco Use   Smoking status: Never   Smokeless tobacco: Never  Vaping Use   Vaping status: Never Used  Substance and Sexual Activity   Alcohol use: Yes    Comment: occasional   Drug use: Never   Sexual activity: Yes    Comment: before I got sick  Other Topics Concern   Not on file  Social History Narrative   Not on file   Social Drivers of Health   Financial Resource Strain: Not on file  Food Insecurity: Not on file  Transportation Needs: Not on file  Physical Activity: Not on file  Stress: Not on file  Social Connections: Not on file  Intimate Partner Violence: Not on file     PHYSICAL EXAM  Vitals:   05/18/24 1017  BP: 120/72  Pulse: 94  Weight: 214 lb (97.1 kg)  Height: 5' 9 (1.753 m)    Body mass index is 31.6 kg/m.   General: The patient is well-developed and well-nourished and in no acute distress  Skin: Extremities are without rash. He has  mild pedal edema, slightly worse on the left.    HEENT:   His pharynx is Mallampatti 3.  Neurologic Exam  Mental status: The patient is alert and oriented x 3 at the time of the examination. The patient has apparent normal recent and remote memory, with an apparently normal attention span and concentration ability.   Speech is normal.  Cranial nerves: Extraocular movements are full. Facial strength and sensation are normal..  Trapezius and sternocleidomastoid strength is normal. No dysarthria is noted.   . No obvious hearing deficits are noted.  Motor:  Muscle bulk is normal. Muscle tone is normal. Strength is 5/5 proximally in the arms and legs.  Strength was 4+/5 in the intrinsic hand muscles and the EHL and intrinsic foot muscles.  Sensory: Sensory testing is intact at the wrist but reduced vib (normal touch) at fingertips.   There is mild reduced vibration sensation at the knees, but is severe loss at the ankles (10-20%) and absent at the toes.  Reduced vib in fingers Touch/pinprick is reduced  mid shin and very reduced in the feet.   Reduced pp in fingers  Coordination: He has good finger-nose-finger but reduced heel-to-shin bilaterally.  Gait and station: Station is normal.   The gait has a reduced stride.   The tandem  gait is poor and wide.. Romberg is mildly positive.   Reflexes: Deep tendon reflexes are symmetric  ric and 1+ bilaterally at deltoid and trace at knees, absent at the ankles.    DIAGNOSTIC DATA (LABS, IMAGING, TESTING) - I reviewed patient records, labs, notes, testing and imaging myself where available.  Lab Results  Component Value Date   WBC 7.9 12/04/2023   HGB 15.2 12/04/2023   HCT 44.6 12/04/2023   MCV 89.6 12/04/2023   PLT 147 (L) 12/04/2023      Component Value Date/Time   NA 137 12/04/2023 1944   K 3.6 12/04/2023 1944   CL 101 12/04/2023 1944   CO2 27 12/04/2023 1944   GLUCOSE 83 12/04/2023 1944   BUN 18 12/04/2023 1944   CREATININE 1.02  12/04/2023 1944   CALCIUM  9.1 12/04/2023 1944   PROT 7.1 09/07/2023 1210   PROT 6.9 06/10/2023 1028   ALBUMIN  4.4 09/07/2023 1210   AST 21 09/07/2023 1210   ALT 17 09/07/2023 1210   ALKPHOS 81 09/07/2023 1210   BILITOT 0.7 09/07/2023 1210   GFRNONAA >60 12/04/2023 1944   GFRAA >90 08/06/2014 0240   Lab Results  Component Value Date   CHOL 121 09/07/2023   HDL 44.60 09/07/2023   LDLCALC 44 09/07/2023   LDLDIRECT 130.0 03/03/2022   TRIG 162.0 (H) 09/07/2023   CHOLHDL 3 09/07/2023   Lab Results  Component Value Date   HGBA1C 5.2 01/25/2024   Lab Results  Component Value Date   VITAMINB12 293 09/07/2023   Lab Results  Component Value Date   TSH 1.81 09/07/2023       ASSESSMENT AND PLAN  Obstructive sleep apnea  Diabetic polyneuropathy associated with type 2 diabetes mellitus (HCC) - Plan: Tapentadol , Urine, Drug Screen, Urine([facility]), tapentadol  (NUCYNTA ) 50 MG tablet  Chronic pain syndrome - Plan: Tapentadol , Urine, Drug Screen, Urine([facility])  Dysesthesia  1.  For the chronic pain, we will continue Lyrica , Nucynta  and Cymbalta .  Because he has untreated sleep apnea, I did reduce the Nucynta  dose from 75 mg 3 times daily to 50 mg 3 times daily back in 2023 and we will continue the lower dose.    PDMP reviewed and he is compliant.    We will check UDS today.     2.   He has severe OSA but was unable to tolerate CPAP.   On multiple occasions, I have discussed the risks of untreated severe sleep apnea including a higher risk of heart attack, arrhythmia and stroke.  He does not feel he could tolerate CPAP and does not want to do a retitration.  At previous visits, discussed the inspire device and UPPP but he is not interested in any surgical approach.   Weight loss would also be beneficial. 3.    Stay active and exercise as tolerated. 4.    In the past, B12 is slightly low.  He should continue to take 1000 mcg daily   Return in 6 months or sooner if there are new or  worsening neurologic symptoms   This visit is part of a comprehensive longitudinal care medical relationship regarding the patients primary diagnosis of neuropathy and related concerns. SABRA Charlie LABOR. Vear, MD, PhD 05/18/2024, 11:05 AM Certified in Neurology, Clinical Neurophysiology, Sleep Medicine, Pain Medicine and Neuroimaging  [facility] [address] [place], [place] [address] [phone]

## 2024-05-20 LAB — TAPENTADOL, URINE

## 2024-05-24 LAB — DRUG SCREEN, URINE
Amphetamines, Urine: NEGATIVE ng/mL
Barbiturate screen, urine: NEGATIVE ng/mL
Benzodiazepine Quant, Ur: NEGATIVE ng/mL
Cannabinoid Quant, Ur: POSITIVE ng/mL — AB
Cocaine (Metab.): NEGATIVE ng/mL
Opiate Quant, Ur: NEGATIVE ng/mL
PCP Quant, Ur: NEGATIVE ng/mL

## 2024-05-24 LAB — TAPENTADOL CONFIRM, URINE
TAPENTADOL CONF, MS, UR: >10000 ng/mL
TAPENTADOL: POSITIVE — AB

## 2024-05-24 LAB — TAPENTADOL, URINE

## 2024-06-14 DIAGNOSIS — E1140 Type 2 diabetes mellitus with diabetic neuropathy, unspecified: Secondary | ICD-10-CM | POA: Diagnosis not present

## 2024-06-22 ENCOUNTER — Other Ambulatory Visit: Payer: Self-pay | Admitting: Neurology

## 2024-06-22 ENCOUNTER — Other Ambulatory Visit: Payer: Self-pay

## 2024-06-22 ENCOUNTER — Other Ambulatory Visit: Payer: Self-pay | Admitting: "Endocrinology

## 2024-06-22 DIAGNOSIS — E1165 Type 2 diabetes mellitus with hyperglycemia: Secondary | ICD-10-CM

## 2024-06-22 MED ORDER — MOUNJARO 10 MG/0.5ML ~~LOC~~ SOAJ: 10.0000 mg | SUBCUTANEOUS | 0 refills | Status: DC

## 2024-06-26 ENCOUNTER — Other Ambulatory Visit: Payer: Self-pay | Admitting: Neurology

## 2024-06-27 ENCOUNTER — Other Ambulatory Visit

## 2024-06-27 ENCOUNTER — Ambulatory Visit: Admitting: "Endocrinology

## 2024-06-27 ENCOUNTER — Encounter: Payer: Self-pay | Admitting: "Endocrinology

## 2024-06-27 VITALS — BP 102/80 | HR 82 | Ht 69.0 in | Wt 222.0 lb

## 2024-06-27 DIAGNOSIS — E782 Mixed hyperlipidemia: Secondary | ICD-10-CM

## 2024-06-27 DIAGNOSIS — Z7985 Long-term (current) use of injectable non-insulin antidiabetic drugs: Secondary | ICD-10-CM

## 2024-06-27 DIAGNOSIS — Z7984 Long term (current) use of oral hypoglycemic drugs: Secondary | ICD-10-CM

## 2024-06-27 DIAGNOSIS — E1165 Type 2 diabetes mellitus with hyperglycemia: Secondary | ICD-10-CM | POA: Diagnosis not present

## 2024-06-27 DIAGNOSIS — Z794 Long term (current) use of insulin: Secondary | ICD-10-CM

## 2024-06-27 LAB — POCT GLYCOSYLATED HEMOGLOBIN (HGB A1C): Hemoglobin A1C: 6.5 % — AB (ref 4.0–5.6)

## 2024-06-27 MED ORDER — GLIPIZIDE 10 MG PO TABS: 10.0000 mg | ORAL_TABLET | Freq: Every day | ORAL | 3 refills | Status: DC

## 2024-06-27 NOTE — Patient Instructions (Signed)
 Will recommend the following: Farxiga  10 mg daily Glipizide  10 mg once every morning, take half extra at 12 pm if blood sugar is more than 180 before lunch   Mounjaro  7.5mg ->10 mg weekly after finishing all the boxes of 7.5mg   Tresiba  50 units daily (decrease by 2 units if BG <80, and increase by 2 units if BG>150 consistently)

## 2024-06-27 NOTE — Progress Notes (Signed)
 Outpatient Endocrinology Note Frank Birmingham, MD  06/27/24   Frank [name] 1956-01-29 [id number]  Referring Provider: Norleen Lynwood ORN, MD Primary Care Provider: Norleen Lynwood ORN, MD Reason for consultation: Subjective   Assessment & Plan  Diagnoses and all orders for this visit:  Uncontrolled type 2 diabetes mellitus with hyperglycemia, with long-term current use of insulin  (HCC) -     POCT glycosylated hemoglobin (Hb A1C) -     Microalbumin / creatinine urine ratio  Long term (current) use of oral hypoglycemic drugs  Long-term (current) use of injectable non-insulin  antidiabetic drugs  Long-term insulin  use (HCC)  Insulin  dose changed (HCC)  Mixed hypercholesterolemia and hypertriglyceridemia  Other orders -     glipiZIDE  (GLUCOTROL ) 10 MG tablet; Take 1 tablet (10 mg total) by mouth daily before breakfast.   Diabetes Type 2 complicated by neuropathy Lab Results  Component Value Date   GFR 65.36 09/07/2023   Hba1c goal less than 7, current Hba1c is  Lab Results  Component Value Date   HGBA1C 6.5 (A) 06/27/2024   Will recommend the following: Farxiga  10 mg daily Glipizide  10 mg once every morning, take half extra at 12 pm if blood sugar is more than 180 before lunch   Mounjaro  7.5mg ->10 mg weekly after finishing all the boxes of 7.5mg   Tresiba  50 units daily (decrease by 2 units if BG <80, and increase by 2 units if BG>150 consistently) Not interested in mealtime insulin   Seen vascular 07/2023: normal pulses/ABI Discussed about lifestyle changes  Neuropathy managed by neurology: Discussed with PCP and neurology to reassess his medications to improve patient's mentation and responsiveness patient's symptoms very blank.  Instructed partner to check on the dosages of medications that the patient is taking to confirm accuracy.  Side effects from medications: Diarrhea from metformin  ER Stopped Ozempic  2 mg weekly to help with weight loss  No known  contraindications to any of above medications  -Last LD and Tg are as follows: Lab Results  Component Value Date   LDLCALC 44 09/07/2023    Lab Results  Component Value Date   TRIG 162.0 (H) 09/07/2023   -Recommend rosuvastatin  20 mg QD -Follow low fat diet and exercise   -Blood pressure goal <140/90 - Microalbumin/creatinine goal < 30 -Last MA/Cr is as follows: No results found for: MICROALBUR, MALB24HUR  -not on ACE/ARB  -diet changes including salt restriction -limit eating outside -counseled BP targets per standards of diabetes care -uncontrolled blood pressure can lead to retinopathy, nephropathy and cardiovascular and atherosclerotic heart disease  Reviewed and counseled on: -A1C target -Blood sugar targets -Complications of uncontrolled diabetes  -Checking blood sugar before meals and bedtime and bring log next visit -All medications with mechanism of action and side effects -Hypoglycemia management: rule of 15's, Glucagon Emergency Kit and medical alert ID -low-carb low-fat plate-method diet -At least 20 minutes of physical activity per day -Annual dilated retinal eye exam and foot exam -compliance and follow up needs -follow up as scheduled or earlier if problem gets worse  Call if blood sugar is less than 70 or consistently above 250    Take a 15 gm snack of carbohydrate at bedtime before you go to sleep if your blood sugar is less than 100.    If you are going to fast after midnight for a test or procedure, ask your physician for instructions on how to reduce/decrease your insulin  dose.    Call if blood sugar is less than 70 or consistently above  250  -Treating a low sugar by rule of 15  (15 gms of sugar every 15 min until sugar is more than 70) If you feel your sugar is low, test your sugar to be sure If your sugar is low (less than 70), then take 15 grams of a fast acting Carbohydrate (3-4 glucose tablets or glucose gel or 4 ounces of juice or regular  soda) Recheck your sugar 15 min after treating low to make sure it is more than 70 If sugar is still less than 70, treat again with 15 grams of carbohydrate          Don't drive the hour of hypoglycemia  If unconscious/unable to eat or drink by mouth, use glucagon injection or nasal spray baqsimi and call 911. Can repeat again in 15 min if still unconscious.  Return in about 6 weeks (around 08/08/2024) for labs today.   I have reviewed current medications, nurse's notes, allergies, vital signs, past medical and surgical history, family medical history, and social history for this encounter. Counseled patient on symptoms, examination findings, lab findings, imaging results, treatment decisions and monitoring and prognosis. The patient understood the recommendations and agrees with the treatment plan. All questions regarding treatment plan were fully answered.  Frank Birmingham, MD  06/27/24    History of Present Illness Frank [name] is a 68 y.o. year old male who presents for follow up on Type 2 diabetes mellitus.  Frank [name] was first diagnosed in 2012.   Diabetes education +  Home diabetes regimen: Non-insulin  hypoglycemic drugs: Farxiga  10 mg daily, mounjaro  7.5mg  weekly, glipizide  XL 10 mg daily    Insulin  regimen: Tresiba  48 units daily          Previously on Ozempic  2 mg weekly Side effects from medications: Diarrhea from metformin  ER  Previous history:  His A1c has ranged between 7 and 10.8 since 2018 Farxiga  was started in 2022, was on Ozempic  since 7/22, previously he took Trulicity  for a year  COMPLICATIONS -  MI/Stroke -  retinopathy +  neuropathy -  nephropathy  BLOOD SUGAR DATA CGM interpretation: At today's visit, we reviewed her CGM downloads. The full report is scanned in the media. Reviewing the CGM trends, BG are well controlled throughout the day except high at 12pm-5pm.  Physical Exam  BP 102/80   Pulse 82   Ht 5' 9 (1.753 m)   Wt  222 lb (100.7 kg)   SpO2 97%   BMI 32.78 kg/m    Constitutional: well developed, well nourished Head: normocephalic, atraumatic Eyes: sclera anicteric, no redness Neck: supple Lungs: normal respiratory effort Neurology: alert and oriented Skin: dry, no appreciable rashes Musculoskeletal: no appreciable defects Psychiatric: normal mood and affect Diabetic Foot Exam - Simple   Simple Foot Form Diabetic Foot exam was performed with the following findings: Yes 06/27/2024  1:17 PM  Visual Inspection No deformities, no ulcerations, no other skin breakdown bilaterally: Yes Sensation Testing Intact to touch and monofilament testing bilaterally: Yes Pulse Check Posterior Tibialis and Dorsalis pulse intact bilaterally: Yes Comments      Current Medications Patient's Medications  New Prescriptions   GLIPIZIDE  (GLUCOTROL ) 10 MG TABLET    Take 1 tablet (10 mg total) by mouth daily before breakfast.  Previous Medications   ACCU-CHEK SOFTCLIX LANCETS LANCETS    Use to check blood sugars daily   ARIPIPRAZOLE  (ABILIFY ) 10 MG TABLET    TAKE 1 TABLET EVERY DAY   CONTINUOUS BLOOD GLUC SENSOR (DEXCOM G7  SENSOR) MISC    Change every 10 days   CYCLOBENZAPRINE  (FLEXERIL ) 5 MG TABLET    TAKE 1 TABLET AT BEDTIME   DROPLET PEN NEEDLES 31G X 8 MM MISC    USE TO INJECT TRESIBA  UNDER THE SKIN EVERY DAY AS DIRECTED   DULOXETINE  (CYMBALTA ) 60 MG CAPSULE    TAKE 1 CAPSULE TWICE DAILY   FARXIGA  10 MG TABS TABLET    TAKE 1 TABLET EVERY DAY BEFORE BREAKFAST   GLUCOSE BLOOD (ACCU-CHEK GUIDE) TEST STRIP    Use to check blood sugars daily   HYDROCHLOROTHIAZIDE  (MICROZIDE ) 12.5 MG CAPSULE    TAKE 1 CAPSULE BY MOUTH EVERY DAY   LORAZEPAM  (ATIVAN ) 1 MG TABLET    TAKE 1 TABLET BY MOUTH TWICE A DAY AS NEEDED   METHYLPHENIDATE  (CONCERTA ) 36 MG PO CR TABLET    Take 1 tablet (36 mg total) by mouth daily.   OMEPRAZOLE (PRILOSEC OTC) 20 MG TABLET    Take 20 mg by mouth daily.   PREGABALIN  (LYRICA ) 200 MG CAPSULE    Take 1  capsule (200 mg total) by mouth 2 (two) times daily.   ROSUVASTATIN  (CRESTOR ) 20 MG TABLET    TAKE 1 TABLET BY MOUTH EVERY DAY   SILDENAFIL  (VIAGRA ) 100 MG TABLET    Take 0.5-1 tablets (50-100 mg total) by mouth daily as needed for erectile dysfunction.   TAPENTADOL  (NUCYNTA ) 50 MG TABLET    Take 1 tablet (50 mg total) by mouth 3 (three) times daily.   TIRZEPATIDE  (MOUNJARO ) 10 MG/0.5ML PEN    Inject 10 mg into the skin once a week.   TRESIBA  FLEXTOUCH 200 UNIT/ML FLEXTOUCH PEN    INJECT 74 UNITS UNDER THE SKIN EVERY DAY  Modified Medications   No medications on file  Discontinued Medications   GLIPIZIDE  (GLUCOTROL  XL) 10 MG 24 HR TABLET    TAKE 1 TABLET EVERY DAY WITH BREAKFAST    Allergies Allergies  Allergen Reactions   Codeine Itching    REACTION: Itching   Influenza Vaccines     Due to hx of GBS   Metformin  And Related Other (See Comments)    GI upset, diarrhea   Other Other (See Comments)    Due to hx of GBS- vaccines    Shellfish Allergy     Past Medical History Past Medical History:  Diagnosis Date   Abdominal pain, left lower quadrant 06/06/2010   Abdominal pain, unspecified site 01/19/2009   ADD 10/09/2008   ALLERGIC RHINITIS 10/09/2008   Allergy    ANXIETY DEPRESSION 02/01/2008   Arthritis    hands ?   Blood transfusion without reported diagnosis    Cataract    removed both eyes    COLONIC POLYPS 02/01/2008   DIABETES MELLITUS, TYPE II 05/20/2010   DYSPNEA 03/12/2010   ERECTILE DYSFUNCTION 10/09/2008   ERECTILE DYSFUNCTION, ORGANIC 05/20/2010   FOOT PAIN, LEFT 05/20/2010   GERD 02/01/2008   Guillain Barr syndrome    HEMORRHOIDS 02/01/2008   HIATAL HERNIA 02/01/2008   HYPERLIPIDEMIA 10/09/2008   HYPERTENSION 10/09/2008   MORTON'S NEUROMA 05/20/2010   Other specified forms of hearing loss 06/27/2009   PERIPHERAL EDEMA 05/20/2010   PERIPHERAL NEUROPATHY 05/20/2010   Sleep apnea    stopped cpap    SLEEP APNEA, OBSTRUCTIVE 02/01/2008   Stricture and stenosis of  esophagus 02/02/2008   Type II or unspecified type diabetes mellitus without mention of complication, uncontrolled 11/14/2010   WRIST PAIN, LEFT 12/05/2009    Past Surgical History  Past Surgical History:  Procedure Laterality Date   CARPAL TUNNEL RELEASE Bilateral    COLONOSCOPY     DENTAL SURGERY     ESOPHAGEAL DILATION  july 2009   ESOPHAGOGASTRODUODENOSCOPY N/A 06/27/2014   Procedure: ESOPHAGOGASTRODUODENOSCOPY (EGD);  Surgeon: Princella CHRISTELLA Nida, MD;  Location: [facility];  Service: Endoscopy;  Laterality: N/A;   EYE SURGERY     catract surgery on both eyes   POLYPECTOMY     ROTATOR CUFF REPAIR     UPPER GASTROINTESTINAL ENDOSCOPY      Family History family history includes Depression in his mother; Diabetes in his brother and mother; Heart disease in his mother; Hyperlipidemia in his mother.  Social History Social History   Socioeconomic History   Marital status: Married    Spouse name: Not on file   Number of children: Not on file   Years of education: Not on file   Highest education level: Not on file  Occupational History   Occupation: Housekeeper [employer]    Employer: [employer]  Tobacco Use   Smoking status: Never   Smokeless tobacco: Never  Vaping Use   Vaping status: Never Used  Substance and Sexual Activity   Alcohol use: Yes    Comment: occasional   Drug use: Never   Sexual activity: Yes    Comment: before I got sick  Other Topics Concern   Not on file  Social History Narrative   Not on file   Social Drivers of Health   Financial Resource Strain: Not on file  Food Insecurity: Not on file  Transportation Needs: Not on file  Physical Activity: Not on file  Stress: Not on file  Social Connections: Not on file  Intimate Partner Violence: Not on file    Lab Results  Component Value Date   HGBA1C 6.5 (A) 06/27/2024   HGBA1C 5.2 01/25/2024   HGBA1C 8.4 (A) 09/03/2023   Lab Results  Component Value Date   CHOL 121 09/07/2023   Lab Results   Component Value Date   HDL 44.60 09/07/2023   Lab Results  Component Value Date   LDLCALC 44 09/07/2023   Lab Results  Component Value Date   TRIG 162.0 (H) 09/07/2023   Lab Results  Component Value Date   CHOLHDL 3 09/07/2023   Lab Results  Component Value Date   CREATININE 1.02 12/04/2023   Lab Results  Component Value Date   GFR 65.36 09/07/2023   No results found for: MACKEY CURRENT     Component Value Date/Time   NA 137 12/04/2023 1944   K 3.6 12/04/2023 1944   CL 101 12/04/2023 1944   CO2 27 12/04/2023 1944   GLUCOSE 83 12/04/2023 1944   BUN 18 12/04/2023 1944   CREATININE 1.02 12/04/2023 1944   CALCIUM  9.1 12/04/2023 1944   PROT 7.1 09/07/2023 1210   PROT 6.9 06/10/2023 1028   ALBUMIN  4.4 09/07/2023 1210   AST 21 09/07/2023 1210   ALT 17 09/07/2023 1210   ALKPHOS 81 09/07/2023 1210   BILITOT 0.7 09/07/2023 1210   GFRNONAA >60 12/04/2023 1944   GFRAA >90 08/06/2014 0240      Latest Ref Rng & Units 12/04/2023    7:44 PM 09/07/2023   12:10 PM 11/02/2022    6:56 PM  BMP  Glucose 70 - 99 mg/dL 83  78  875   BUN 8 - 23 mg/dL 18  14  16    Creatinine 0.61 - 1.24 mg/dL 8.97  8.85  8.87  Sodium 135 - 145 mmol/L 137  140  136   Potassium 3.5 - 5.1 mmol/L 3.6  4.2  3.8   Chloride 98 - 111 mmol/L 101  101  99   CO2 22 - 32 mmol/L 27  30  26    Calcium  8.9 - 10.3 mg/dL 9.1  9.3  9.4        Component Value Date/Time   WBC 7.9 12/04/2023 1944   RBC 4.98 12/04/2023 1944   HGB 15.2 12/04/2023 1944   HCT 44.6 12/04/2023 1944   PLT 147 (L) 12/04/2023 1944   MCV 89.6 12/04/2023 1944   MCV 93.0 12/30/2013 1537   MCH 30.5 12/04/2023 1944   MCHC 34.1 12/04/2023 1944   RDW 13.2 12/04/2023 1944   LYMPHSABS 2.4 12/04/2023 1944   MONOABS 0.6 12/04/2023 1944   EOSABS 0.1 12/04/2023 1944   BASOSABS 0.0 12/04/2023 1944     Parts of this note may have been dictated using voice recognition software. There may be variances in spelling and vocabulary which  are unintentional. Not all errors are proofread. Please notify the dino if any discrepancies are noted or if the meaning of any statement is not clear.

## 2024-06-28 ENCOUNTER — Other Ambulatory Visit: Payer: Self-pay | Admitting: Neurology

## 2024-06-28 ENCOUNTER — Encounter: Payer: Self-pay | Admitting: Endocrinology

## 2024-06-28 ENCOUNTER — Encounter: Payer: Self-pay | Admitting: "Endocrinology

## 2024-06-28 DIAGNOSIS — E1142 Type 2 diabetes mellitus with diabetic polyneuropathy: Secondary | ICD-10-CM

## 2024-06-28 LAB — MICROALBUMIN / CREATININE URINE RATIO
Creatinine, Urine: 40 mg/dL (ref 20–320)
Microalb, Ur: <0.2 mg/dL

## 2024-06-28 MED ORDER — TAPENTADOL HCL 50 MG PO TABS: 50.0000 mg | ORAL_TABLET | Freq: Three times a day (TID) | ORAL | 0 refills | Status: DC

## 2024-06-28 NOTE — Telephone Encounter (Signed)
 Last seen on 05/18/24 Follow up scheduled on 12/19/24   Dispensed Days Supply Quantity Provider Pharmacy  Nucynta  50 mg tablet 06/02/2024 30 90 tablet Sater, Charlie LABOR, MD [facility]...     Rx pending to be signed

## 2024-06-28 NOTE — Telephone Encounter (Signed)
 Pt called needing a refill request for his tapentadol  (NUCYNTA ) 50 MG tablet sent to the [facility] on [address]

## 2024-06-29 ENCOUNTER — Ambulatory Visit

## 2024-07-13 ENCOUNTER — Other Ambulatory Visit (HOSPITAL_COMMUNITY): Payer: Self-pay

## 2024-07-13 ENCOUNTER — Telehealth: Payer: Self-pay

## 2024-07-13 ENCOUNTER — Other Ambulatory Visit: Payer: Self-pay

## 2024-07-13 DIAGNOSIS — E1165 Type 2 diabetes mellitus with hyperglycemia: Secondary | ICD-10-CM

## 2024-07-13 MED ORDER — MOUNJARO 10 MG/0.5ML ~~LOC~~ SOAJ: 10.0000 mg | SUBCUTANEOUS | 0 refills | Status: DC

## 2024-07-13 NOTE — Telephone Encounter (Signed)
 Pt needs PA done for mounjaro .

## 2024-07-13 NOTE — Telephone Encounter (Signed)
 Pharmacy Patient Advocate Encounter   Received notification from Pt Calls Messages that prior authorization for Mounjaro  10mg /0.83ml is required/requested.   Insurance verification completed.   The patient is insured through [employer].   Per test claim: Refill too soon. PA is not needed at this time. Medication was filled 06/23/24 for a 90 day supply. Next eligible fill date is 08/25/2024.

## 2024-07-27 ENCOUNTER — Other Ambulatory Visit: Payer: Self-pay | Admitting: Neurology

## 2024-07-27 DIAGNOSIS — E1142 Type 2 diabetes mellitus with diabetic polyneuropathy: Secondary | ICD-10-CM

## 2024-07-27 MED ORDER — TAPENTADOL HCL 50 MG PO TABS: 50.0000 mg | ORAL_TABLET | Freq: Three times a day (TID) | ORAL | 0 refills | Status: DC

## 2024-07-27 NOTE — Telephone Encounter (Signed)
 Pt is needing a refill on his tapentadol  (NUCYNTA ) 50 MG tablet and is needing it sent to the [facility] on [address]

## 2024-07-27 NOTE — Telephone Encounter (Signed)
 Requested Prescriptions   Pending Prescriptions Disp Refills   tapentadol  (NUCYNTA ) 50 MG tablet 90 tablet 0    Sig: Take 1 tablet (50 mg total) by mouth 3 (three) times daily.   Last seen 05/18/24 Next appt 12/19/24  This will not let me check registry or reorder

## 2024-08-02 ENCOUNTER — Other Ambulatory Visit: Payer: Self-pay

## 2024-08-02 DIAGNOSIS — E1165 Type 2 diabetes mellitus with hyperglycemia: Secondary | ICD-10-CM

## 2024-08-02 MED ORDER — DROPLET PEN NEEDLES 31G X 8 MM MISC: 1.0000 | 3 refills | Status: AC | PRN

## 2024-08-10 ENCOUNTER — Ambulatory Visit: Admitting: "Endocrinology

## 2024-08-15 ENCOUNTER — Other Ambulatory Visit: Payer: Self-pay | Admitting: Neurology

## 2024-08-15 NOTE — Telephone Encounter (Signed)
 Requested Prescriptions   Pending Prescriptions Disp Refills   pregabalin  (LYRICA ) 200 MG capsule 180 capsule 1    Sig: Take 1 capsule (200 mg total) by mouth 2 (two) times daily.   Last seen 05/18/24 Next appt 12/19/24 Dispenses   Dispensed Days Supply Quantity Provider Pharmacy  pregabalin  (LYRICA ) capsule 05/12/2024 90 180 Lomax, Amy, NP   PREGABALIN  200 MG CAPSULE 02/16/2024 90  Lomax, Amy, NP [facility] Mail D...  pregabalin  (LYRICA ) capsule 02/16/2024 90 180 Lomax, Amy, NP   pregabalin  200 mg capsule 11/10/2023 90 180 capsule Buck Saucer, MD [facility] Mail D...  PREGABALIN  200 MG CAPSULE 08/24/2023 90  Buck Saucer, MD [facility] Mail D...  pregabalin  200 mg capsule 08/24/2023 90 180 capsule Athar, Saima, MD [email or web address]

## 2024-08-15 NOTE — Telephone Encounter (Signed)
 Pt called to request medication refill  pregabalin  (LYRICA ) 200 MG capsule  Pt medications is to be sent to  [facility] [id number] [place] [place] - [address] Phone: [phone]  Fax: [phone]

## 2024-08-18 ENCOUNTER — Telehealth: Payer: Self-pay | Admitting: Neurology

## 2024-08-18 NOTE — Telephone Encounter (Signed)
 Pt is asking if Amy, NP will call in Gabapentin  200mg  to [facility] [phone]

## 2024-08-18 NOTE — Telephone Encounter (Signed)
 LOOKS LIKE HE IS SUPPOSED TO BE ON LYRICA  AND YOU WOULDN'T PRESCRIBE GABAPENIN AND LYRICA  AT THE SAME TIME TYPICALLY.  IS THAT CORRECT?

## 2024-08-19 MED ORDER — PREGABALIN 200 MG PO CAPS: 200.0000 mg | ORAL_CAPSULE | Freq: Two times a day (BID) | ORAL | 0 refills | Status: AC

## 2024-08-19 NOTE — Telephone Encounter (Signed)
 Patient on multiple psychotropic medications and high dose Lyrica , medication regimen reviewed and last note with Dr. Vear also reviewed.  Of note, UDS was positive for cannabinoid in October 2025.  Will renew Lyrica  prescription as per plan as documented in last office visit note.

## 2024-08-22 NOTE — Telephone Encounter (Signed)
 Called and was informed that the medication has been picked up.

## 2024-08-25 ENCOUNTER — Encounter: Payer: Self-pay | Admitting: "Endocrinology

## 2024-08-25 ENCOUNTER — Ambulatory Visit: Admitting: "Endocrinology

## 2024-08-25 VITALS — BP 102/60 | HR 105 | Resp 18 | Ht 68.0 in | Wt 212.4 lb

## 2024-08-25 DIAGNOSIS — Z7984 Long term (current) use of oral hypoglycemic drugs: Secondary | ICD-10-CM | POA: Diagnosis not present

## 2024-08-25 DIAGNOSIS — Z7985 Long-term (current) use of injectable non-insulin antidiabetic drugs: Secondary | ICD-10-CM | POA: Diagnosis not present

## 2024-08-25 DIAGNOSIS — Z794 Long term (current) use of insulin: Secondary | ICD-10-CM | POA: Diagnosis not present

## 2024-08-25 DIAGNOSIS — E1165 Type 2 diabetes mellitus with hyperglycemia: Secondary | ICD-10-CM | POA: Diagnosis not present

## 2024-08-25 DIAGNOSIS — E782 Mixed hyperlipidemia: Secondary | ICD-10-CM

## 2024-08-25 MED ORDER — MOUNJARO 10 MG/0.5ML ~~LOC~~ SOAJ: 10.0000 mg | SUBCUTANEOUS | 0 refills | Status: AC

## 2024-08-25 MED ORDER — GLIPIZIDE 5 MG PO TABS: 5.0000 mg | ORAL_TABLET | Freq: Every day | ORAL | 1 refills | Status: AC

## 2024-08-25 NOTE — Progress Notes (Addendum)
 "   Outpatient Endocrinology Note Frank Birmingham, MD  08/25/24   Frank [name] 03-Jan-1956 [id number]  Referring Provider: Norleen Lynwood ORN, MD Primary Care Provider: Norleen Lynwood ORN, MD Reason for consultation: Subjective   Assessment & Plan  Frank [name] was seen today for diabetes.  Diagnoses and all orders for this visit:  Uncontrolled type 2 diabetes mellitus with hyperglycemia, with long-term current use of insulin  (HCC) -     tirzepatide  (MOUNJARO ) 10 MG/0.5ML Pen; Inject 10 mg into the skin once a week.  Long term (current) use of oral hypoglycemic drugs  Long-term (current) use of injectable non-insulin  antidiabetic drugs  Long-term insulin  use (HCC)  Mixed hypercholesterolemia and hypertriglyceridemia  Other orders -     glipiZIDE  (GLUCOTROL ) 5 MG tablet; Take 1 tablet (5 mg total) by mouth daily before breakfast.   Diabetes Type 2 complicated by neuropathy Lab Results  Component Value Date   GFR 65.36 09/07/2023   Hba1c goal less than 7, current Hba1c is  Lab Results  Component Value Date   HGBA1C 6.5 (A) 06/27/2024   Will recommend the following: Farxiga  10 mg daily Regular Glipizide  5 mg once every morning Mounjaro  7.5mg ->10 mg weekly after finishing all the boxes of 7.5mg   Tresiba  48 units daily (decrease by 2 units if BG <80, and increase by 2 units if BG>150 consistently) Not interested in mealtime insulin   Seen vascular 07/2023: normal pulses/ABI Discussed about lifestyle changes  Neuropathy managed by neurology: Discussed with PCP and neurology to reassess his medications to improve patient's mentation and responsiveness patient's symptoms very blank.  Instructed partner to check on the dosages of medications that the patient is taking to confirm accuracy.  Side effects from medications: Diarrhea from metformin  ER Stopped Ozempic  2 mg weekly to help with weight loss  Sleeps 12am-9:30am and again 2pm-7pm  No known contraindications to any of  above medications  -Last LD and Tg are as follows: Lab Results  Component Value Date   LDLCALC 44 09/07/2023    Lab Results  Component Value Date   TRIG 162.0 (H) 09/07/2023   -Recommend rosuvastatin  20 mg QD -Follow low fat diet and exercise   -Blood pressure goal <140/90 - Microalbumin/creatinine goal < 30 -Last MA/Cr is as follows: Lab Results  Component Value Date   MICROALBUR <0.2 06/27/2024    -not on ACE/ARB  -diet changes including salt restriction -limit eating outside -counseled BP targets per standards of diabetes care -uncontrolled blood pressure can lead to retinopathy, nephropathy and cardiovascular and atherosclerotic heart disease  Reviewed and counseled on: -A1C target -Blood sugar targets -Complications of uncontrolled diabetes  -Checking blood sugar before meals and bedtime and bring log next visit -All medications with mechanism of action and side effects -Hypoglycemia management: rule of 15's, Glucagon Emergency Kit and medical alert ID -low-carb low-fat plate-method diet -At least 20 minutes of physical activity per day -Annual dilated retinal eye exam and foot exam -compliance and follow up needs -follow up as scheduled or earlier if problem gets worse  Call if blood sugar is less than 70 or consistently above 250    Take a 15 gm snack of carbohydrate at bedtime before you go to sleep if your blood sugar is less than 100.    If you are going to fast after midnight for a test or procedure, ask your physician for instructions on how to reduce/decrease your insulin  dose.    Call if blood sugar is less than 70 or consistently above 250  -  Treating a low sugar by rule of 15  (15 gms of sugar every 15 min until sugar is more than 70) If you feel your sugar is low, test your sugar to be sure If your sugar is low (less than 70), then take 15 grams of a fast acting Carbohydrate (3-4 glucose tablets or glucose gel or 4 ounces of juice or regular  soda) Recheck your sugar 15 min after treating low to make sure it is more than 70 If sugar is still less than 70, treat again with 15 grams of carbohydrate          Don't drive the hour of hypoglycemia  If unconscious/unable to eat or drink by mouth, use glucagon injection or nasal spray baqsimi and call 911. Can repeat again in 15 min if still unconscious.  Return in about 4 months (around 12/23/2024).   I have reviewed current medications, nurse's notes, allergies, vital signs, past medical and surgical history, family medical history, and social history for this encounter. Counseled patient on symptoms, examination findings, lab findings, imaging results, treatment decisions and monitoring and prognosis. The patient understood the recommendations and agrees with the treatment plan. All questions regarding treatment plan were fully answered.  Frank Birmingham, MD  08/25/24    History of Present Illness Frank [name] is a 69 y.o. year old male who presents for follow up on Type 2 diabetes mellitus.  Frank [name] was first diagnosed in 2012.   Diabetes education +  Home diabetes regimen: Non-insulin  hypoglycemic drugs: Farxiga  10 mg daily, mounjaro  7.5mg  weekly, regular glipizide  10 mg qam   Insulin  regimen: Tresiba  48 units daily          Previously on Ozempic  2 mg weekly Side effects from medications: Diarrhea from metformin  ER  Previous history:  His A1c has ranged between 7 and 10.8 since 2018 Farxiga  was started in 2022, was on Ozempic  since 7/22, previously he took Trulicity  for a year  COMPLICATIONS -  MI/Stroke -  retinopathy +  neuropathy -  nephropathy  BLOOD SUGAR DATA CGM interpretation: At today's visit, we reviewed her CGM downloads. The full report is scanned in the media. Reviewing the CGM trends, BG are well-controlled most of the time, overall 22% highs which are variable and mostly between midnight to 8 AM and again close to 2 to 3  PM.  Physical Exam  BP 102/60   Pulse (!) 105   Resp 18   Ht 5' 8 (1.727 m)   Wt 212 lb 6.4 oz (96.3 kg)   SpO2 96%   BMI 32.30 kg/m    Constitutional: well developed, well nourished Head: normocephalic, atraumatic Eyes: sclera anicteric, no redness Neck: supple Lungs: normal respiratory effort Neurology: alert and oriented Skin: dry, no appreciable rashes Musculoskeletal: no appreciable defects Psychiatric: normal mood and affect Diabetic Foot Exam - Simple   No data filed      Current Medications Patient's Medications  New Prescriptions   No medications on file  Previous Medications   ACCU-CHEK SOFTCLIX LANCETS LANCETS    Use to check blood sugars daily   ARIPIPRAZOLE  (ABILIFY ) 10 MG TABLET    TAKE 1 TABLET EVERY DAY   CONTINUOUS BLOOD GLUC SENSOR (DEXCOM G7 SENSOR) MISC    Change every 10 days   CYCLOBENZAPRINE  (FLEXERIL ) 5 MG TABLET    TAKE 1 TABLET AT BEDTIME   DULOXETINE  (CYMBALTA ) 60 MG CAPSULE    TAKE 1 CAPSULE TWICE DAILY   FARXIGA  10 MG TABS  TABLET    TAKE 1 TABLET EVERY DAY BEFORE BREAKFAST   GLUCOSE BLOOD (ACCU-CHEK GUIDE) TEST STRIP    Use to check blood sugars daily   HYDROCHLOROTHIAZIDE  (MICROZIDE ) 12.5 MG CAPSULE    TAKE 1 CAPSULE BY MOUTH EVERY DAY   INSULIN  PEN NEEDLE (DROPLET PEN NEEDLES) 31G X 8 MM MISC    1 Needle by Other route as needed.   LORAZEPAM  (ATIVAN ) 1 MG TABLET    TAKE 1 TABLET BY MOUTH TWICE A DAY AS NEEDED   METHYLPHENIDATE  (CONCERTA ) 36 MG PO CR TABLET    Take 1 tablet (36 mg total) by mouth daily.   OMEPRAZOLE (PRILOSEC OTC) 20 MG TABLET    Take 20 mg by mouth daily.   PREGABALIN  (LYRICA ) 200 MG CAPSULE    Take 1 capsule (200 mg total) by mouth 2 (two) times daily.   ROSUVASTATIN  (CRESTOR ) 20 MG TABLET    TAKE 1 TABLET BY MOUTH EVERY DAY   SILDENAFIL  (VIAGRA ) 100 MG TABLET    Take 0.5-1 tablets (50-100 mg total) by mouth daily as needed for erectile dysfunction.   TAPENTADOL  (NUCYNTA ) 50 MG TABLET    Take 1 tablet (50 mg total) by  mouth 3 (three) times daily.   TRESIBA  FLEXTOUCH 200 UNIT/ML FLEXTOUCH PEN    INJECT 74 UNITS UNDER THE SKIN EVERY DAY  Modified Medications   Modified Medication Previous Medication   GLIPIZIDE  (GLUCOTROL ) 5 MG TABLET glipiZIDE  (GLUCOTROL ) 10 MG tablet      Take 1 tablet (5 mg total) by mouth daily before breakfast.    Take 1 tablet (10 mg total) by mouth daily before breakfast.   TIRZEPATIDE  (MOUNJARO ) 10 MG/0.5ML PEN tirzepatide  (MOUNJARO ) 10 MG/0.5ML Pen      Inject 10 mg into the skin once a week.    Inject 10 mg into the skin once a week.  Discontinued Medications   No medications on file    Allergies Allergies  Allergen Reactions   Codeine Itching    REACTION: Itching   Influenza Vaccines     Due to hx of GBS   Metformin  And Related Other (See Comments)    GI upset, diarrhea   Other Other (See Comments)    Due to hx of GBS- vaccines    Shellfish Allergy     Past Medical History Past Medical History:  Diagnosis Date   Abdominal pain, left lower quadrant 06/06/2010   Abdominal pain, unspecified site 01/19/2009   ADD 10/09/2008   ALLERGIC RHINITIS 10/09/2008   Allergy    ANXIETY DEPRESSION 02/01/2008   Arthritis    hands ?   Blood transfusion without reported diagnosis    Cataract    removed both eyes    COLONIC POLYPS 02/01/2008   DIABETES MELLITUS, TYPE II 05/20/2010   DYSPNEA 03/12/2010   ERECTILE DYSFUNCTION 10/09/2008   ERECTILE DYSFUNCTION, ORGANIC 05/20/2010   FOOT PAIN, LEFT 05/20/2010   GERD 02/01/2008   Guillain Barr syndrome    HEMORRHOIDS 02/01/2008   HIATAL HERNIA 02/01/2008   HYPERLIPIDEMIA 10/09/2008   HYPERTENSION 10/09/2008   MORTON'S NEUROMA 05/20/2010   Other specified forms of hearing loss 06/27/2009   PERIPHERAL EDEMA 05/20/2010   PERIPHERAL NEUROPATHY 05/20/2010   Sleep apnea    stopped cpap    SLEEP APNEA, OBSTRUCTIVE 02/01/2008   Stricture and stenosis of esophagus 02/02/2008   Type II or unspecified type diabetes mellitus without mention of  complication, uncontrolled 11/14/2010   WRIST PAIN, LEFT 12/05/2009    Past Surgical  History Past Surgical History:  Procedure Laterality Date   CARPAL TUNNEL RELEASE Bilateral    COLONOSCOPY     DENTAL SURGERY     ESOPHAGEAL DILATION  july 2009   ESOPHAGOGASTRODUODENOSCOPY N/A 06/27/2014   Procedure: ESOPHAGOGASTRODUODENOSCOPY (EGD);  Surgeon: Princella CHRISTELLA Nida, MD;  Location: [facility];  Service: Endoscopy;  Laterality: N/A;   EYE SURGERY     catract surgery on both eyes   POLYPECTOMY     ROTATOR CUFF REPAIR     UPPER GASTROINTESTINAL ENDOSCOPY      Family History family history includes Depression in his mother; Diabetes in his brother and mother; Heart disease in his mother; Hyperlipidemia in his mother.  Social History Social History   Socioeconomic History   Marital status: Married    Spouse name: Not on file   Number of children: Not on file   Years of education: Not on file   Highest education level: Not on file  Occupational History   Occupation: Housekeeper [employer]    Employer: [employer]  Tobacco Use   Smoking status: Never   Smokeless tobacco: Never  Vaping Use   Vaping status: Never Used  Substance and Sexual Activity   Alcohol use: Yes    Comment: occasional   Drug use: Never   Sexual activity: Yes    Comment: before I got sick  Other Topics Concern   Not on file  Social History Narrative   Not on file   Social Drivers of Health   Tobacco Use: Low Risk (08/25/2024)   Patient History    Smoking Tobacco Use: Never    Smokeless Tobacco Use: Never    Passive Exposure: Not on file  Financial Resource Strain: Not on file  Food Insecurity: Not on file  Transportation Needs: Not on file  Physical Activity: Not on file  Stress: Not on file  Social Connections: Not on file  Intimate Partner Violence: Not on file  Depression (PHQ2-9): High Risk (09/07/2023)   Depression (PHQ2-9)    PHQ-2 Score: 14  Alcohol Screen: Not on file  Housing: Not on file   Utilities: Not on file  Health Literacy: Not on file    Lab Results  Component Value Date   HGBA1C 6.5 (A) 06/27/2024   HGBA1C 5.2 01/25/2024   HGBA1C 8.4 (A) 09/03/2023   Lab Results  Component Value Date   CHOL 121 09/07/2023   Lab Results  Component Value Date   HDL 44.60 09/07/2023   Lab Results  Component Value Date   LDLCALC 44 09/07/2023   Lab Results  Component Value Date   TRIG 162.0 (H) 09/07/2023   Lab Results  Component Value Date   CHOLHDL 3 09/07/2023   Lab Results  Component Value Date   CREATININE 1.02 12/04/2023   Lab Results  Component Value Date   GFR 65.36 09/07/2023   Lab Results  Component Value Date   MICROALBUR <0.2 06/27/2024       Component Value Date/Time   NA 137 12/04/2023 1944   K 3.6 12/04/2023 1944   CL 101 12/04/2023 1944   CO2 27 12/04/2023 1944   GLUCOSE 83 12/04/2023 1944   BUN 18 12/04/2023 1944   CREATININE 1.02 12/04/2023 1944   CALCIUM  9.1 12/04/2023 1944   PROT 7.1 09/07/2023 1210   PROT 6.9 06/10/2023 1028   ALBUMIN  4.4 09/07/2023 1210   AST 21 09/07/2023 1210   ALT 17 09/07/2023 1210   ALKPHOS 81 09/07/2023 1210   BILITOT 0.7  09/07/2023 1210   GFRNONAA >60 12/04/2023 1944   GFRAA >90 08/06/2014 0240      Latest Ref Rng & Units 12/04/2023    7:44 PM 09/07/2023   12:10 PM 11/02/2022    6:56 PM  BMP  Glucose 70 - 99 mg/dL 83  78  875   BUN 8 - 23 mg/dL 18  14  16    Creatinine 0.61 - 1.24 mg/dL 8.97  8.85  8.87   Sodium 135 - 145 mmol/L 137  140  136   Potassium 3.5 - 5.1 mmol/L 3.6  4.2  3.8   Chloride 98 - 111 mmol/L 101  101  99   CO2 22 - 32 mmol/L 27  30  26    Calcium  8.9 - 10.3 mg/dL 9.1  9.3  9.4        Component Value Date/Time   WBC 7.9 12/04/2023 1944   RBC 4.98 12/04/2023 1944   HGB 15.2 12/04/2023 1944   HCT 44.6 12/04/2023 1944   PLT 147 (L) 12/04/2023 1944   MCV 89.6 12/04/2023 1944   MCV 93.0 12/30/2013 1537   MCH 30.5 12/04/2023 1944   MCHC 34.1 12/04/2023 1944   RDW 13.2  12/04/2023 1944   LYMPHSABS 2.4 12/04/2023 1944   MONOABS 0.6 12/04/2023 1944   EOSABS 0.1 12/04/2023 1944   BASOSABS 0.0 12/04/2023 1944     Parts of this note may have been dictated using voice recognition software. There may be variances in spelling and vocabulary which are unintentional. Not all errors are proofread. Please notify the dino if any discrepancies are noted or if the meaning of any statement is not clear.   "

## 2024-08-25 NOTE — Patient Instructions (Signed)

## 2024-08-29 ENCOUNTER — Telehealth: Payer: Self-pay | Admitting: Neurology

## 2024-08-29 DIAGNOSIS — E1142 Type 2 diabetes mellitus with diabetic polyneuropathy: Secondary | ICD-10-CM

## 2024-08-29 NOTE — Telephone Encounter (Signed)
 Patient request refill for tapentadol  (NUCYNTA ) 50 MG tablet send to  [facility] [id number]

## 2024-08-30 MED ORDER — TAPENTADOL HCL 50 MG PO TABS: 50.0000 mg | ORAL_TABLET | Freq: Three times a day (TID) | ORAL | 0 refills | Status: AC

## 2024-08-30 NOTE — Telephone Encounter (Signed)
 Pt  Partner called to  follow up about medication request   tapentadol  (NUCYNTA ) 50 MG tablet ,Ipartner wanted to make sure the  request was placed  [facility] [id number] [place],  - [address] Phone: [phone]  Fax: [phone]

## 2024-08-30 NOTE — Addendum Note (Signed)
 Addended by: ONEITA NEVELYN BRAVO on: 08/30/2024 03:18 PM   Modules accepted: Orders

## 2024-08-30 NOTE — Telephone Encounter (Signed)
 Requested Prescriptions   Pending Prescriptions Disp Refills   tapentadol  (NUCYNTA ) 50 MG tablet 90 tablet 0    Sig: Take 1 tablet (50 mg total) by mouth 3 (three) times daily.   Lat seen 05/18/24 Next appt 12/19/24 Dispenses   Dispensed Days Supply Quantity Provider Pharmacy  Nucynta  50 mg tablet 07/30/2024 30 90 tablet Sater, Charlie LABOR, MD [facility]...  Nucynta  50 mg tablet 07/01/2024 30 90 tablet Sater, Charlie LABOR, MD [facility]...  Nucynta  50 mg tablet 06/02/2024 30 90 tablet Sater, Charlie LABOR, MD [facility]...  Nucynta  50 mg tablet 05/04/2024 30 90 tablet Dohmeier, Dedra, MD [facility]...  Nucynta  50 mg tablet 03/30/2024 30 90 tablet Sater, Charlie LABOR, MD [facility]...  Nucynta  50 mg tablet 03/01/2024 30 90 tablet Sater, Charlie LABOR, MD [facility]...  Nucynta  50 mg tablet 01/31/2024 30 90 tablet Sater, Charlie LABOR, MD [facility]...  Nucynta  50 mg tablet 01/12/2024 20 60 tablet Sater, Charlie LABOR, MD [facility]...  NUCYNTA  50 MG TABLET 12/26/2023 10 30 each Sater, Charlie LABOR, MD [facility] [phone] - G...  NUCYNTA  50 MG TABLET 11/27/2023 30 90 each Sater, Charlie LABOR, MD [facility] [phone] - G...  NUCYNTA  50 MG TABLET 10/28/2023 30 90 each Sater, Charlie LABOR, MD [facility] [phone] - G...  NUCYNTA  50 MG TABLET 09/29/2023 30 90 each Sater, Charlie LABOR, MD [facility] [phone] - G.[facility]

## 2024-10-17 ENCOUNTER — Encounter: Admitting: Family Medicine

## 2024-12-19 ENCOUNTER — Ambulatory Visit: Admitting: Neurology

## 2024-12-22 ENCOUNTER — Ambulatory Visit: Admitting: "Endocrinology
